# Patient Record
Sex: Female | Born: 1960 | Race: Black or African American | Hispanic: No | Marital: Married | Smoking: Former smoker
Health system: Southern US, Community
[De-identification: ages and names within clinical notes are randomized; demographics above are authoritative.]

## PROBLEM LIST (undated history)

## (undated) DIAGNOSIS — I10 Essential (primary) hypertension: Secondary | ICD-10-CM

## (undated) DIAGNOSIS — F32A Depression, unspecified: Secondary | ICD-10-CM

## (undated) DIAGNOSIS — M069 Rheumatoid arthritis, unspecified: Secondary | ICD-10-CM

## (undated) DIAGNOSIS — I4819 Other persistent atrial fibrillation: Secondary | ICD-10-CM

## (undated) DIAGNOSIS — J84112 Idiopathic pulmonary fibrosis: Secondary | ICD-10-CM

## (undated) DIAGNOSIS — E669 Obesity, unspecified: Secondary | ICD-10-CM

## (undated) DIAGNOSIS — I509 Heart failure, unspecified: Secondary | ICD-10-CM

## (undated) DIAGNOSIS — E119 Type 2 diabetes mellitus without complications: Secondary | ICD-10-CM

## (undated) DIAGNOSIS — G4733 Obstructive sleep apnea (adult) (pediatric): Secondary | ICD-10-CM

## (undated) DIAGNOSIS — J189 Pneumonia, unspecified organism: Secondary | ICD-10-CM

## (undated) DIAGNOSIS — F329 Major depressive disorder, single episode, unspecified: Secondary | ICD-10-CM

## (undated) DIAGNOSIS — F419 Anxiety disorder, unspecified: Secondary | ICD-10-CM

## (undated) HISTORY — DX: Other persistent atrial fibrillation: I48.19

## (undated) HISTORY — DX: Rheumatoid arthritis, unspecified: M06.9

## (undated) HISTORY — PX: OTHER SURGICAL HISTORY: SHX169

## (undated) HISTORY — DX: Idiopathic pulmonary fibrosis: J84.112

## (undated) HISTORY — DX: Obstructive sleep apnea (adult) (pediatric): G47.33

---

## 1998-12-07 ENCOUNTER — Emergency Department (HOSPITAL_COMMUNITY): Admission: EM | Admit: 1998-12-07 | Discharge: 1998-12-07 | Payer: Self-pay | Admitting: Emergency Medicine

## 1998-12-09 ENCOUNTER — Other Ambulatory Visit: Admission: RE | Admit: 1998-12-09 | Discharge: 1998-12-09 | Payer: Self-pay | Admitting: *Deleted

## 2000-07-26 ENCOUNTER — Emergency Department (HOSPITAL_COMMUNITY): Admission: EM | Admit: 2000-07-26 | Discharge: 2000-07-26 | Payer: Self-pay | Admitting: Emergency Medicine

## 2000-10-28 ENCOUNTER — Emergency Department (HOSPITAL_COMMUNITY): Admission: EM | Admit: 2000-10-28 | Discharge: 2000-10-28 | Payer: Self-pay | Admitting: Emergency Medicine

## 2001-06-05 ENCOUNTER — Emergency Department (HOSPITAL_COMMUNITY): Admission: EM | Admit: 2001-06-05 | Discharge: 2001-06-05 | Payer: Self-pay | Admitting: Emergency Medicine

## 2002-09-21 ENCOUNTER — Encounter: Payer: Self-pay | Admitting: Emergency Medicine

## 2002-09-21 ENCOUNTER — Emergency Department (HOSPITAL_COMMUNITY): Admission: EM | Admit: 2002-09-21 | Discharge: 2002-09-21 | Payer: Self-pay | Admitting: Emergency Medicine

## 2002-09-26 ENCOUNTER — Encounter: Admission: RE | Admit: 2002-09-26 | Discharge: 2002-09-26 | Payer: Self-pay | Admitting: Internal Medicine

## 2002-10-16 ENCOUNTER — Ambulatory Visit (HOSPITAL_COMMUNITY): Admission: RE | Admit: 2002-10-16 | Discharge: 2002-10-16 | Payer: Self-pay | Admitting: Internal Medicine

## 2002-10-16 ENCOUNTER — Encounter (INDEPENDENT_AMBULATORY_CARE_PROVIDER_SITE_OTHER): Payer: Self-pay | Admitting: Cardiology

## 2003-11-14 ENCOUNTER — Encounter: Admission: RE | Admit: 2003-11-14 | Discharge: 2003-11-14 | Payer: Self-pay | Admitting: Internal Medicine

## 2004-06-22 ENCOUNTER — Encounter: Admission: RE | Admit: 2004-06-22 | Discharge: 2004-06-22 | Payer: Self-pay | Admitting: Internal Medicine

## 2004-08-28 ENCOUNTER — Encounter: Admission: RE | Admit: 2004-08-28 | Discharge: 2004-08-28 | Payer: Self-pay | Admitting: General Surgery

## 2004-09-01 ENCOUNTER — Encounter (INDEPENDENT_AMBULATORY_CARE_PROVIDER_SITE_OTHER): Payer: Self-pay | Admitting: *Deleted

## 2004-09-01 ENCOUNTER — Ambulatory Visit (HOSPITAL_COMMUNITY): Admission: RE | Admit: 2004-09-01 | Discharge: 2004-09-01 | Payer: Self-pay | Admitting: General Surgery

## 2004-09-01 ENCOUNTER — Ambulatory Visit (HOSPITAL_BASED_OUTPATIENT_CLINIC_OR_DEPARTMENT_OTHER): Admission: RE | Admit: 2004-09-01 | Discharge: 2004-09-01 | Payer: Self-pay | Admitting: General Surgery

## 2007-07-09 ENCOUNTER — Emergency Department (HOSPITAL_COMMUNITY): Admission: EM | Admit: 2007-07-09 | Discharge: 2007-07-09 | Payer: Self-pay | Admitting: Emergency Medicine

## 2007-12-31 ENCOUNTER — Emergency Department (HOSPITAL_COMMUNITY): Admission: EM | Admit: 2007-12-31 | Discharge: 2007-12-31 | Payer: Self-pay | Admitting: Emergency Medicine

## 2008-01-23 ENCOUNTER — Observation Stay (HOSPITAL_COMMUNITY): Admission: EM | Admit: 2008-01-23 | Discharge: 2008-01-24 | Payer: Self-pay | Admitting: Family Medicine

## 2008-01-23 ENCOUNTER — Ambulatory Visit: Payer: Self-pay | Admitting: Cardiology

## 2008-01-24 ENCOUNTER — Encounter (INDEPENDENT_AMBULATORY_CARE_PROVIDER_SITE_OTHER): Payer: Self-pay | Admitting: *Deleted

## 2008-01-24 HISTORY — PX: DOPPLER ECHOCARDIOGRAPHY: SHX263

## 2010-04-01 ENCOUNTER — Encounter (INDEPENDENT_AMBULATORY_CARE_PROVIDER_SITE_OTHER): Payer: Self-pay | Admitting: Emergency Medicine

## 2010-04-01 ENCOUNTER — Ambulatory Visit: Payer: Self-pay | Admitting: Vascular Surgery

## 2010-04-01 ENCOUNTER — Emergency Department (HOSPITAL_COMMUNITY): Admission: EM | Admit: 2010-04-01 | Discharge: 2010-04-01 | Payer: Self-pay | Admitting: Emergency Medicine

## 2010-05-12 ENCOUNTER — Emergency Department (HOSPITAL_COMMUNITY): Admission: EM | Admit: 2010-05-12 | Discharge: 2010-05-12 | Payer: Self-pay | Admitting: Emergency Medicine

## 2010-07-23 ENCOUNTER — Encounter (INDEPENDENT_AMBULATORY_CARE_PROVIDER_SITE_OTHER): Payer: Self-pay | Admitting: Emergency Medicine

## 2010-07-23 ENCOUNTER — Ambulatory Visit: Payer: Self-pay | Admitting: Vascular Surgery

## 2010-07-23 ENCOUNTER — Emergency Department (HOSPITAL_COMMUNITY): Admission: EM | Admit: 2010-07-23 | Discharge: 2010-07-23 | Payer: Self-pay | Admitting: Emergency Medicine

## 2011-01-24 LAB — BASIC METABOLIC PANEL
CO2: 27 mEq/L (ref 19–32)
Calcium: 9.7 mg/dL (ref 8.4–10.5)
GFR calc Af Amer: >60 mL/min (ref >60)
GFR calc non Af Amer: >60 mL/min (ref >60)
Sodium: 145 mEq/L (ref 135–145)

## 2011-01-24 LAB — CBC
Hemoglobin: 13.1 g/dL (ref 12.0–15.0)
RBC: 4.55 MIL/uL (ref 3.87–5.11)
WBC: 8.1 10*3/uL (ref 4.0–10.5)

## 2011-01-24 LAB — DIFFERENTIAL
Lymphocytes Relative: 26 % (ref 12–46)
Lymphs Abs: 2.1 10*3/uL (ref 0.7–4.0)
Monocytes Relative: 5 % (ref 3–12)
Neutro Abs: 5.4 10*3/uL (ref 1.7–7.7)
Neutrophils Relative %: 67 % (ref 43–77)

## 2011-01-24 LAB — POCT CARDIAC MARKERS
CKMB, poc: <1 ng/mL — ABNORMAL LOW (ref 1.0–8.0)
Myoglobin, poc: 31.5 ng/mL (ref 12–200)

## 2011-01-25 LAB — DIFFERENTIAL
Basophils Absolute: 0 10*3/uL (ref 0.0–0.1)
Lymphocytes Relative: 26 % (ref 12–46)
Monocytes Absolute: 0.3 10*3/uL (ref 0.1–1.0)
Monocytes Relative: 5 % (ref 3–12)
Neutro Abs: 4.5 10*3/uL (ref 1.7–7.7)
Neutrophils Relative %: 67 % (ref 43–77)

## 2011-01-25 LAB — BASIC METABOLIC PANEL
Calcium: 8.6 mg/dL (ref 8.4–10.5)
Creatinine, Ser: 0.65 mg/dL (ref 0.4–1.2)
GFR calc Af Amer: >60 mL/min (ref >60)
GFR calc non Af Amer: >60 mL/min (ref >60)
Sodium: 133 mEq/L — ABNORMAL LOW (ref 135–145)

## 2011-01-25 LAB — CBC
Hemoglobin: 12.9 g/dL (ref 12.0–15.0)
RBC: 4.41 MIL/uL (ref 3.87–5.11)
RDW: 13.5 % (ref 11.5–15.5)

## 2011-02-06 ENCOUNTER — Emergency Department (HOSPITAL_COMMUNITY)
Admission: EM | Admit: 2011-02-06 | Discharge: 2011-02-06 | Disposition: A | Discharge status: Home / Self Care | Payer: No Typology Code available for payment source | Attending: Emergency Medicine | Admitting: Emergency Medicine

## 2011-02-06 DIAGNOSIS — IMO0002 Reserved for concepts with insufficient information to code with codable children: Secondary | ICD-10-CM | POA: Insufficient documentation

## 2011-02-06 DIAGNOSIS — I509 Heart failure, unspecified: Secondary | ICD-10-CM | POA: Insufficient documentation

## 2011-02-06 DIAGNOSIS — I4891 Unspecified atrial fibrillation: Secondary | ICD-10-CM | POA: Insufficient documentation

## 2011-02-06 DIAGNOSIS — M25519 Pain in unspecified shoulder: Secondary | ICD-10-CM | POA: Insufficient documentation

## 2011-02-06 DIAGNOSIS — I10 Essential (primary) hypertension: Secondary | ICD-10-CM | POA: Insufficient documentation

## 2011-02-06 DIAGNOSIS — S0003XA Contusion of scalp, initial encounter: Secondary | ICD-10-CM | POA: Insufficient documentation

## 2011-02-06 DIAGNOSIS — S0083XA Contusion of other part of head, initial encounter: Secondary | ICD-10-CM | POA: Insufficient documentation

## 2011-03-23 NOTE — H&P (Signed)
NAMEKATIE, Kathleen Kane               ACCOUNT NO.:  [id number]   MEDICAL RECORD NO.:  [id number]          PATIENT TYPE:  INP   LOCATION:  1846                         FACILITY:  [facility]   PHYSICIAN:  Michaelyn Barter, M.D. DATE OF BIRTH:  Dec 14, 1960   DATE OF ADMISSION:  01/23/2008  DATE OF DISCHARGE:                              HISTORY & PHYSICAL   PRIMARY CARE PHYSICIAN:  Talmadge Coventry, M.D.   CHIEF COMPLAINTS:  Shortness of breath.   HISTORY OF PRESENT ILLNESS:  [name] is a 50 year old female who  states that approximately 3 days ago she developed chest congestion, a  sore throat,  and right ear pain.  She initially attributed all of her  symptoms to the presence of a cold. Over the past 3 days, she began to  develop problems breathing, particularly when taking deep breaths.  Likewise, she has also indicated that she has recently noticed that when  she attempts to lie down, she feels more short of breath.  There has  been no nausea, vomiting, fevers or chills.  She does have an occasional  cough which is nonproductive.  When asked if there was chest pain, she  initially stated no, and then she stated that she has some vague  tightness under her breast area, and she pointed to her upper abdominal  region.  She states that it occurred earlier today but has since  disappeared   PAST MEDICAL HISTORY:  The patient indicates that she had an episode of  CHF back in 1992.   PAST SURGICAL HISTORY:  None.   HOME MEDICATIONS:  None.   ALLERGIES:  None.   SOCIAL HISTORY:  Cigarettes:  The patient started smoking at the age of  28.  She smokes three-fourth of a pack per day.  Alcohol:  The patient  stopped drinking in 2000.  Street drugs.  The patient denies.   FAMILY HISTORY:  Mother has hypertension.  Father:  Hypertension,  hyperlipidemia.Marland Kitchen   REVIEW OF SYSTEMS:  As per HPI, otherwise all other systems are  negative.   PHYSICAL EXAMINATION:  GENERAL:  The patient is  awake.  She is  cooperative.  She coughs numerous times.  VITAL SIGNS:  Temperature is 98.5, blood pressure 171/94, heart rate 87,  respirations 18, O2 saturation is 95%.  HEENT:  Normocephalic, atraumatic.  Anicteric. Extraocular movements are  intact.  Oral mucosa is pink.  No thrush or exudates.  The patient's  right ear tympanic membrane is difficult to visualize secondary to a  large amount of cerumen present.  NECK:  No JVD.  The patient has a thick neck.  No lymphadenopathy, no  thyromegaly.  CARDIAC:  S1-S2 present.  RESPIRATORY:  No crackles or wheezes.  ABDOMEN:  Flat, soft, nontender, nondistended.  Positive bowel sounds.  No masses palpated.  EXTREMITIES:  No leg edema.  NEUROLOGIC:  The patient is alert and oriented x3.  Cranial nerves II-  XII are intact.  MUSCULOSKELETAL:  5/5 upper and lower extremity strength.   LABORATORY DATA:  Creatinine 0.6, CK-MB at point of care less than 1.0.  Troponin  I at point of care less than 0.05.  BNP 81. White blood cell  count 7.9, hemoglobin 13.5, hematocrit 40.1, platelet count 298.   Chest x-ray reveals peribronchial thickening.  No active disease.   ASSESSMENT/PLAN:  1. Episode of atrial fibrillation with rapid ventricular response.  On      review of the patient's repeat EKG, this appears to have resolved.      This may have been an episode of paroxysmal atrial fibrillation.      The patient states that she has never had this before. In light of      the patient's rhythm converting spontaneously, will monitor for      now.  The trigger for this is questionable. Will check cardiac      markers as well as 2-D echocardiogram. Will also consider starting      the negative inotropic agent, particularly in light of the patient      still having sinus tachycardia.  Will start an aspirin. Will check      a TSH, T4, as well as D-dimer and fasting lipid profile. If there      is a repeat occurrence of atrial fibrillation, they may  consider      starting anticoagulation on the patient.  2. Probable bronchitis.  Will start the patient on empiric      antibiotics.  3. Elevated blood pressure. Will start the patient on antihypertensive      medications.  4. Gastrointestinal prophylaxis.  Will provide Protonix.  5. Deep vein thrombosis prophylaxis.  Will provide Lovenox.      Michaelyn Barter, M.D.  Electronically Signed     OR/MEDQ  D:  01/23/2008  T:  01/23/2008  Job:  [id number]   cc:   Talmadge Coventry, M.D.

## 2011-03-26 NOTE — Op Note (Signed)
[name], [name]               ACCOUNT NO.:  [id number]   MEDICAL RECORD NO.:  [id number]          PATIENT TYPE:  AMB   LOCATION:  DSC                          FACILITY:  [facility]   PHYSICIAN:  Rose Phi. Young, M.D.   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   OPERATION:  Excision of same.   SURGEON:  Rose Phi. Maple Hudson, M.D.   ANESTHESIA:  General.   OPERATIVE PROCEDURE:  This 50 year old female had almost a hidradenitis in  the inferior portion of her left breast in a narrow area.  It has been  actively draining, but infection was under control.   After suitable general anesthesia was induced, the patient was placed in the  supine position with the arms extended on the arm board.  The left breast  was prepped and draped in usual fashion.  A curvilinear incision in the  inferior portion of the left breast, taking out a wide ellipse of skin where  the infected tissue was, was then outlined with a marking pencil, then was  excised.  We went completely around all this infected tissue.  We irrigated  the wound.  I then closed it loosely with 3-0 nylon sutures.  Dressing  applied.  Patient was transferred to recovery room in satisfactory  condition, having tolerated the procedure well.      [name]   PRY/MEDQ  D:  09/01/2004  T:  09/01/2004  Job:  [id number]

## 2011-07-28 ENCOUNTER — Emergency Department (HOSPITAL_COMMUNITY)
Admission: EM | Admit: 2011-07-28 | Discharge: 2011-07-28 | Discharge status: Against Medical Advice | Payer: Self-pay | Attending: Emergency Medicine | Admitting: Emergency Medicine

## 2011-07-28 DIAGNOSIS — R51 Headache: Secondary | ICD-10-CM | POA: Insufficient documentation

## 2011-07-28 DIAGNOSIS — H538 Other visual disturbances: Secondary | ICD-10-CM | POA: Insufficient documentation

## 2011-07-28 DIAGNOSIS — H5710 Ocular pain, unspecified eye: Secondary | ICD-10-CM | POA: Insufficient documentation

## 2011-08-02 LAB — DIFFERENTIAL
Basophils Relative: 0
Lymphocytes Relative: 23
Lymphs Abs: 1.8
Monocytes Absolute: 0.4
Monocytes Relative: 5
Neutro Abs: 5.5
Neutrophils Relative %: 70

## 2011-08-02 LAB — CARDIAC PANEL(CRET KIN+CKTOT+MB+TROPI)
CK, MB: 1
Relative Index: INVALID
Total CK: 110
Total CK: 94
Troponin I: 0.02

## 2011-08-02 LAB — D-DIMER, QUANTITATIVE: D-Dimer, Quant: 0.37

## 2011-08-02 LAB — T4, FREE: Free T4: 0.94

## 2011-08-02 LAB — CBC
Hemoglobin: 11.9 — ABNORMAL LOW
Hemoglobin: 13.5
MCHC: 33.6
MCHC: 33.7
MCV: 84.8
RBC: 4.19
RBC: 4.76
RDW: 14.3
WBC: 7.9

## 2011-08-02 LAB — I-STAT 8, (EC8 V) (CONVERTED LAB)
Acid-base deficit: 1
Bicarbonate: 24.2 — ABNORMAL HIGH
HCT: 45
Operator id: 265201
TCO2: 25
pCO2, Ven: 40.7 — ABNORMAL LOW
pH, Ven: 7.382 — ABNORMAL HIGH

## 2011-08-02 LAB — BASIC METABOLIC PANEL
CO2: 25
Calcium: 8.9
Creatinine, Ser: 0.5
GFR calc Af Amer: >60
GFR calc non Af Amer: >60
Sodium: 139

## 2011-08-02 LAB — POCT I-STAT CREATININE
Creatinine, Ser: 0.6
Operator id: 265201

## 2011-08-02 LAB — POCT CARDIAC MARKERS
CKMB, poc: <1 — ABNORMAL LOW
Myoglobin, poc: 25.9
Troponin i, poc: <0.05

## 2011-08-02 LAB — LIPID PANEL: Cholesterol: 155

## 2011-08-02 LAB — TROPONIN I: Troponin I: 0.02

## 2011-08-02 LAB — B-NATRIURETIC PEPTIDE (CONVERTED LAB): Pro B Natriuretic peptide (BNP): 77

## 2011-11-09 DIAGNOSIS — I509 Heart failure, unspecified: Secondary | ICD-10-CM

## 2011-11-09 HISTORY — DX: Heart failure, unspecified: I50.9

## 2011-12-25 ENCOUNTER — Emergency Department (INDEPENDENT_AMBULATORY_CARE_PROVIDER_SITE_OTHER)
Admission: EM | Admit: 2011-12-25 | Discharge: 2011-12-25 | Disposition: A | Discharge status: Home / Self Care | Payer: Self-pay | Source: Home / Self Care | Attending: Family Medicine | Admitting: Family Medicine

## 2011-12-25 ENCOUNTER — Encounter (HOSPITAL_COMMUNITY): Payer: Self-pay

## 2011-12-25 DIAGNOSIS — S0502XA Injury of conjunctiva and corneal abrasion without foreign body, left eye, initial encounter: Secondary | ICD-10-CM

## 2011-12-25 DIAGNOSIS — S058X9A Other injuries of unspecified eye and orbit, initial encounter: Secondary | ICD-10-CM

## 2011-12-25 DIAGNOSIS — H109 Unspecified conjunctivitis: Secondary | ICD-10-CM

## 2011-12-25 MED ORDER — HYDROCODONE-ACETAMINOPHEN 5-325 MG PO TABS: ORAL_TABLET | ORAL | Status: AC

## 2011-12-25 MED ORDER — POLYMYXIN B-TRIMETHOPRIM 10000-0.1 UNIT/ML-% OP SOLN: 1.0000 [drp] | OPHTHALMIC | Status: AC

## 2011-12-25 NOTE — Discharge Instructions (Signed)
Use the eye drops as directed. Use normal saline to flush the eye just prior to using the antibiotic drops. Return immediately to the Emergency Department should your symptoms worsen in any way, over the next 24 to 48 hours, such as severe or worsening pain, ANY change in vision such as loss of vision, worsening of the blurred vision, loss of ANY field of vision such as peripheral. If no improvement by end of next week, please call the ophthalmologist listed on your discharge papers.

## 2011-12-25 NOTE — ED Provider Notes (Signed)
History     CSN: [id number]  Arrival date & time 12/25/11  1655   First MD Initiated Contact with Patient 12/25/11 1759      Chief Complaint  Patient presents with  . Eye Drainage    (Consider location/radiation/quality/duration/timing/severity/associated sxs/prior treatment) HPI Comments: Kathleen Kane presents for evaluation of LEFT eye discomfort, drainage, tearing, and blurred vision that started 3 days ago. She is unsure about injury to her eye but she thinks that she may have had an eyelash and was rubbing the eye at onset of symptoms.   Patient is a 51 y.o. female presenting with eye problem. The history is provided by the patient.  Eye Problem  This is a new problem. The current episode started more than 2 days ago. The problem occurs constantly. The problem has not changed since onset.There is pain in the left eye. There was no injury mechanism. There is no history of trauma to the eye. She does not wear contacts. Associated symptoms include blurred vision, discharge, foreign body sensation and eye redness.    Past Medical History  Diagnosis Date  . Atrial fibrillation     Past Surgical History  Procedure Date  . Cesarean section     History reviewed. No pertinent family history.  History  Substance Use Topics  . Smoking status: Current Everyday Smoker -- 1.0 packs/day  . Smokeless tobacco: Not on file  . Alcohol Use: No    OB History    Grav Para Term Preterm Abortions TAB SAB Ect Mult Living                  Review of Systems  Constitutional: Negative.   HENT: Negative.   Eyes: Positive for blurred vision, discharge and redness.  Respiratory: Negative.   Cardiovascular: Negative.   Gastrointestinal: Negative.   Genitourinary: Negative.   Musculoskeletal: Negative.   Skin: Negative.   Neurological: Negative.     Allergies  Review of patient's allergies indicates no known allergies.  Home Medications   Current Outpatient Rx  Name Route Sig Dispense  Refill  . FUROSEMIDE 20 MG PO TABS Oral Take 20 mg by mouth 2 (two) times daily.    . IBUPROFEN 600 MG PO TABS Oral Take 600 mg by mouth every 6 (six) hours as needed.    Marland Kitchen HYDROCODONE-ACETAMINOPHEN 5-325 MG PO TABS  Take one to two tablets every 4 to 6 hours as needed for pain 10 tablet 0  . POLYMYXIN B-TRIMETHOPRIM 10000-0.1 UNIT/ML-% OP SOLN Both Eyes Place 1 drop into both eyes every 4 (four) hours. 10 mL 0    BP 179/99  Pulse 94  Temp(Src) 98.4 F (36.9 C) (Oral)  Resp 18  SpO2 96%  Physical Exam  Nursing note and vitals reviewed. Constitutional: She is oriented to person, place, and time. She appears well-developed and well-nourished.  HENT:  Head: Normocephalic and atraumatic.  Eyes: EOM are normal. Pupils are equal, round, and reactive to light. Left eye exhibits discharge. Left conjunctiva is injected.  Fundoscopic exam:      The left eye shows no AV nicking, no exudate and no papilledema.  Slit lamp exam:      The right eye shows no anterior chamber bulge.       The left eye shows corneal abrasion and fluorescein uptake. The left eye shows no foreign body and no anterior chamber bulge.  Neck: Normal range of motion.  Pulmonary/Chest: Effort normal.  Musculoskeletal: Normal range of motion.  Neurological: She is alert  and oriented to person, place, and time.  Skin: Skin is warm and dry.  Psychiatric: Her behavior is normal.    ED Course  Procedures (including critical care time)  Labs Reviewed - No data to display No results found.   1. Corneal abrasion, left   2. Conjunctivitis       MDM  rx given for polytrim and hydrocodone PRN for pain; cannot afford Acular; follow up and return precautions given        Richardo Priest, MD 12/25/11 1910

## 2011-12-25 NOTE — ED Notes (Signed)
Pt has eye drainage that started three days ago.

## 2012-02-17 ENCOUNTER — Encounter (HOSPITAL_COMMUNITY): Payer: Self-pay | Admitting: *Deleted

## 2012-02-17 ENCOUNTER — Emergency Department (HOSPITAL_COMMUNITY): Payer: Self-pay

## 2012-02-17 ENCOUNTER — Emergency Department (INDEPENDENT_AMBULATORY_CARE_PROVIDER_SITE_OTHER)
Admission: EM | Admit: 2012-02-17 | Discharge: 2012-02-17 | Disposition: A | Discharge status: Nursing Home (Includes ICF) | Payer: Self-pay | Source: Home / Self Care | Attending: Emergency Medicine | Admitting: Emergency Medicine

## 2012-02-17 ENCOUNTER — Emergency Department (HOSPITAL_COMMUNITY)
Admission: EM | Admit: 2012-02-17 | Discharge: 2012-02-18 | Disposition: A | Discharge status: Home / Self Care | Payer: Self-pay | Attending: Emergency Medicine | Admitting: Emergency Medicine

## 2012-02-17 DIAGNOSIS — R079 Chest pain, unspecified: Secondary | ICD-10-CM

## 2012-02-17 DIAGNOSIS — R05 Cough: Secondary | ICD-10-CM | POA: Insufficient documentation

## 2012-02-17 DIAGNOSIS — R0989 Other specified symptoms and signs involving the circulatory and respiratory systems: Secondary | ICD-10-CM

## 2012-02-17 DIAGNOSIS — R0602 Shortness of breath: Secondary | ICD-10-CM | POA: Insufficient documentation

## 2012-02-17 DIAGNOSIS — I4891 Unspecified atrial fibrillation: Secondary | ICD-10-CM

## 2012-02-17 DIAGNOSIS — R030 Elevated blood-pressure reading, without diagnosis of hypertension: Secondary | ICD-10-CM | POA: Insufficient documentation

## 2012-02-17 DIAGNOSIS — R609 Edema, unspecified: Secondary | ICD-10-CM | POA: Insufficient documentation

## 2012-02-17 DIAGNOSIS — R059 Cough, unspecified: Secondary | ICD-10-CM | POA: Insufficient documentation

## 2012-02-17 DIAGNOSIS — I509 Heart failure, unspecified: Secondary | ICD-10-CM | POA: Insufficient documentation

## 2012-02-17 DIAGNOSIS — R0600 Dyspnea, unspecified: Secondary | ICD-10-CM

## 2012-02-17 DIAGNOSIS — J811 Chronic pulmonary edema: Secondary | ICD-10-CM | POA: Insufficient documentation

## 2012-02-17 DIAGNOSIS — I10 Essential (primary) hypertension: Secondary | ICD-10-CM

## 2012-02-17 DIAGNOSIS — M7989 Other specified soft tissue disorders: Secondary | ICD-10-CM | POA: Insufficient documentation

## 2012-02-17 HISTORY — DX: Heart failure, unspecified: I50.9

## 2012-02-17 LAB — BASIC METABOLIC PANEL
CO2: 24 mEq/L (ref 19–32)
Calcium: 9.7 mg/dL (ref 8.4–10.5)
GFR calc non Af Amer: >90 mL/min (ref >90)
Glucose, Bld: 144 mg/dL — ABNORMAL HIGH (ref 70–99)
Potassium: 3.6 mEq/L (ref 3.5–5.1)
Sodium: 138 mEq/L (ref 135–145)

## 2012-02-17 LAB — POCT I-STAT TROPONIN I: Troponin i, poc: 0.01 ng/mL (ref 0.00–0.08)

## 2012-02-17 LAB — CBC
Platelets: 254 10*3/uL (ref 150–400)
RBC: 4.36 MIL/uL (ref 3.87–5.11)
RDW: 13.5 % (ref 11.5–15.5)
WBC: 7.5 10*3/uL (ref 4.0–10.5)

## 2012-02-17 LAB — DIFFERENTIAL
Basophils Absolute: 0 10*3/uL (ref 0.0–0.1)
Eosinophils Absolute: 0.1 10*3/uL (ref 0.0–0.7)
Eosinophils Relative: 2 % (ref 0–5)
Lymphocytes Relative: 35 % (ref 12–46)
Lymphs Abs: 2.6 10*3/uL (ref 0.7–4.0)
Neutrophils Relative %: 57 % (ref 43–77)

## 2012-02-17 MED ORDER — ALBUTEROL SULFATE (5 MG/ML) 0.5% IN NEBU
5.0000 mg | INHALATION_SOLUTION | Freq: Once | RESPIRATORY_TRACT | Status: AC
  Administered 2012-02-17: 5 mg via RESPIRATORY_TRACT
  Filled 2012-02-17 (×2): qty 0.5

## 2012-02-17 MED ORDER — FUROSEMIDE 20 MG PO TABS: 20.0000 mg | ORAL_TABLET | Freq: Two times a day (BID) | ORAL | Status: DC

## 2012-02-17 MED ORDER — ASPIRIN 81 MG PO CHEW
CHEWABLE_TABLET | ORAL | Status: AC
  Filled 2012-02-17: qty 1

## 2012-02-17 MED ORDER — AMLODIPINE BESYLATE 10 MG PO TABS: 10.0000 mg | ORAL_TABLET | Freq: Every day | ORAL | Status: DC

## 2012-02-17 MED ORDER — ASPIRIN 81 MG PO CHEW
324.0000 mg | CHEWABLE_TABLET | Freq: Once | ORAL | Status: AC
  Administered 2012-02-17: 324 mg via ORAL

## 2012-02-17 MED ORDER — ASPIRIN 81 MG PO CHEW
CHEWABLE_TABLET | ORAL | Status: AC
  Filled 2012-02-17: qty 3

## 2012-02-17 NOTE — ED Notes (Signed)
C/o low sternal chest pain, short of breath, ankles swollen. States she has had CHF before and states this feels the same way.

## 2012-02-17 NOTE — Discharge Instructions (Signed)
Hypertension Information As your heart beats, it forces blood through your arteries. This force is your blood pressure. If the pressure is too high, it is called hypertension (HTN) or high blood pressure. HTN is dangerous because you may have it and not know it. High blood pressure may mean that your heart has to work harder to pump blood. Your arteries may be narrow or stiff. The extra work puts you at risk for heart disease, stroke, and other problems.  Blood pressure consists of two numbers, a higher number over a lower, 110/72, for example. It is stated as "110 over 72." The ideal is below 120 for the top number (systolic) and under 80 for the bottom (diastolic).  You should pay close attention to your blood pressure if you have certain conditions such as:  Heart failure.   Prior heart attack.   Diabetes   Chronic kidney disease.   Prior stroke.   Multiple risk factors for heart disease.  To see if you have HTN, your blood pressure should be measured while you are seated with your arm held at the level of the heart. It should be measured at least twice. A one-time elevated blood pressure reading (especially in the Emergency Department) does not mean that you need treatment. There may be conditions in which the blood pressure is different between your right and left arms. It is important to see your caregiver soon for a recheck. Most people have essential hypertension which means that there is not a specific cause. This type of high blood pressure may be lowered by changing lifestyle factors such as:  Stress.   Smoking.   Lack of exercise.   Excessive weight.   Drug/tobacco/alcohol use.   Eating less salt.  Most people do not have symptoms from high blood pressure until it has caused damage to the body. Effective treatment can often prevent, delay or reduce that damage. TREATMENT  Treatment for high blood pressure, when a cause has been identified, is directed at the cause. There  are a large number of medications to treat HTN. These fall into several categories, and your caregiver will help you select the medicines that are best for you. Medications may have side effects. You should review side effects with your caregiver. If your blood pressure stays high after you have made lifestyle changes or started on medicines,   Your medication(s) may need to be changed.   Other problems may need to be addressed.   Be certain you understand your prescriptions, and know how and when to take your medicine.   Be sure to follow up with your caregiver within the time frame advised (usually within two weeks) to have your blood pressure rechecked and to review your medications.   If you are taking more than one medicine to lower your blood pressure, make sure you know how and at what times they should be taken. Taking two medicines at the same time can result in blood pressure that is too low.  Document Released: 12/28/2005 Document Revised: 07/07/2011 Document Reviewed: 01/04/2008 [facility] Patient Information 2012 [place], [place].Pulmonary Edema Pulmonary edema (PE) is a condition in which fluid collects in the lungs. This makes it hard to breathe. PE may be a result of the heart not pumping very well, or a result of injury.  CAUSES   Coronary artery disease (blockages in the arteries of the heart) deprives the heart muscle of oxygen, and weakens the muscle. A heart attack is a form of coronary artery disease.  High blood pressure causes the heart muscle to work harder than usual. Over time, the heart muscle may get stiff, and it starts to work less efficiently. It may also fatigue and weaken.   Viral infection of the heart (myocarditis) may weaken the heart muscle.   Metabolic conditions such as thyroid disease, excessive alcohol use, certain vitamin deficiencies, or diabetes, may also weaken the heart muscle.   Leaky or stiff heart valves may impair normal heart function.    Lung disease may strain the heart muscle.   Excessive demands on the heart such as too much salt or fluid intake. Failure to take prescribed medicines.   Lung injury from toxins such as heat or poisonous gas.   Infection in the lungs or other parts of the body.   Fluid overload caused by kidney failure or medications.  SYMPTOMS   Shortness of breath at rest or with exertion.   Grunting, wheezing or gurgling while breathing.   Feeling like you are cannot get enough air.   Breaths are shallow and fast.   A lot of coughing with frothy or bloody mucus.   Skin may become cool, damp and turn a pale or bluish color.  DIAGNOSIS  Initial diagnosis may be based on your history, symptoms, and a physical examination. Additional tests for PE may include:  EKG.   Chest X-ray.   Blood tests.   Stress test.   Ultrasound evaluation of the heart (echocardiogram).   Evaluation by a heart doctor (cardiologist).   Test of the heart arteries to look for blockages (angiogram).   Check of blood oxygen.  TREATMENT  Treatment of PE will depend on the underlying cause and will focus first on relieving the symptoms.  Extra oxygen to make breathing easier and assist with removing mucus. This may include breathing treatments or a tube into the lungs and a breathing machine.   Medicine to help the body get rid of extra water, usually through an IV.   IV drugs or pills to help the heart pump better.   If poor heart function is the cause, it may include:   Procedures to open blocked arteries, repair damaged heart valves, or remove some of the damaged heart muscle.   A pacemaker to help the heart pump with less effort.  HOME CARE INSTRUCTIONS   Activity Level -- Your caregiver will help you determine what type of exercise program may be helpful. It is important to maintain strength and increase it if possible. Pace your activities to avoid shortness of breath or chest pain. Rest for at  least 1 hour before and after meals. Cardiac rehabilitation programs are available in some locations.   Diet -- Eat a heart healthy diet, low in salt, saturated fat, and cholesterol. Ask for help with choices.   Weight Monitoring --Record your hospital or clinic weight. When you get home, compare it to your scale and record your weight. Then, weigh yourself first thing in the morning daily, and record the weights. Tell your caregiver right away if you have gained 3 lb/1.4 kg in 1 day, 5 lb/2.3 kg in a week, or whatever amount you were told to report. Your medications may need to be adjusted.   Blood pressure monitoring should be done as often as directed. You can get a home blood pressure cuff at your drugstore. Record these values and bring them with you for your clinic visits. Notify your caregiver if you become dizzy or lightheaded upon standing up.  If you are currently a smoker, it is time to quit. Nicotine makes your heart work harder and is one of the leading causes of heart (cardiac) deaths. Do not use nicotine gum or patches before talking to your doctor.   Make an appointment with your caregiver as directed for follow-up.  SEEK IMMEDIATE MEDICAL CARE IF:   You have severe chest pain. Especially if the pain is crushing or pressure-like and spreads to the arms, back, neck, or jaw, or if you have sweating, are feeling sick to your stomach (nausea), or you are experiencing shortness of breath. THIS IS AN EMERGENCY. DO NOT wait to see if the pain will go away. Get medical help at once. Call for local emergency medical help. DO NOT drive yourself to the hospital.   Your weight increases by 3 lb/1.4 kg in 1 day or 5 lb/2.3 kg in a week.   You notice increasing shortness of breath that is unusual for you. This may happen during rest, sleep or with activity.   You develop chest pain (angina) or pain that is unusual for you.   You develop sweating or nausea that is unusual for you.   You  notice more swelling in your hands, feet, ankles or abdomen.   You notice lasting (persistent) dizziness, blurred vision, headache, or unsteadiness.   You begin to cough up bloody mucus (sputum).   You are unable to sleep because it is hard to breathe.   You begin to feel a "jumping" or "fluttering" sensation (palpitations) in the chest that is unusual for you.  IMPORTANT:  Make a list of every medicine, vitamin, or herbal supplement you are taking. Keep the list with you at all times. Show it to your caregiver at every visit and before starting a new medicine. Keep the list up to date.   Ask your caregiver or pharmacist to help you write a plan or schedule so that you know things about each medicine such as:   Why you are taking it.   The possible side effects.   The best time of day to take it.   Foods to take with it or avoid.   When to stop taking it.   Ask your caregiver for a copy of your latest heart tracing (EKG or ECG). Keep a copy with you at all times.  Document Released: 01/15/2003 Document Revised: 10/14/2011 Document Reviewed: 12/22/2007 [facility] Patient Information 2012 [place], [place].

## 2012-02-17 NOTE — ED Provider Notes (Signed)
Medical screening examination/treatment/procedure(s) were performed by non-physician practitioner and as supervising physician I was immediately available for consultation/collaboration.   Gwyneth Sprout, MD 02/17/12 [phone]

## 2012-02-17 NOTE — ED Notes (Signed)
o2 via nasal cannula @ 2 L/min

## 2012-02-17 NOTE — ED Provider Notes (Signed)
History     CSN: [id number]  Arrival date & time 02/17/12  2010   First MD Initiated Contact with Patient 02/17/12 2032      9:17 PM HPI Patient states "I am here for my atrial fibrillation and CHF". Patient reports for several days has begun to feel short of breath with exertion and laying flat. Also reports significant swelling and lower extremities. Associated with a mild cough which is worse with laying flat reports a significant history of medication noncompliance for at least one or 2 years due to lack of insurance. Patient is a 51 y.o. female presenting with chest pain. The history is provided by the patient.  Chest Pain The chest pain began 3 - 5 days ago. Chest pain occurs constantly. The chest pain is unchanged. The pain is associated with breathing and exertion. At its most intense, the pain is at 7/10. The quality of the pain is described as aching. The pain does not radiate. Primary symptoms include shortness of breath and cough. Pertinent negatives for primary symptoms include no fever, no fatigue, no wheezing, no abdominal pain, no nausea, no vomiting and no dizziness.  Pertinent negatives for associated symptoms include no diaphoresis, no numbness and no weakness. She tried nothing for the symptoms. Risk factors include smoking/tobacco exposure and obesity.  Her past medical history is significant for arrhythmia, CHF and hypertension.  Pertinent negatives for past medical history include no cancer, no hyperlipidemia, no MI and no PE.     Past Medical History  Diagnosis Date  . Atrial fibrillation   . CHF (congestive heart failure)   . Atrial fibrillation     Past Surgical History  Procedure Date  . Cesarean section   . Left breast cyst     No family history on file.  History  Substance Use Topics  . Smoking status: Current Everyday Smoker -- 1.0 packs/day  . Smokeless tobacco: Not on file  . Alcohol Use: No    OB History    Grav Para Term Preterm Abortions  TAB SAB Ect Mult Living                  Review of Systems  Constitutional: Negative for fever, diaphoresis and fatigue.  HENT: Negative for neck pain.   Respiratory: Positive for cough and shortness of breath. Negative for wheezing.   Cardiovascular: Positive for chest pain and leg swelling.  Gastrointestinal: Negative for nausea, vomiting and abdominal pain.  Neurological: Negative for dizziness, weakness, numbness and headaches.  All other systems reviewed and are negative.    Allergies  Review of patient's allergies indicates no known allergies.  Home Medications   Current Outpatient Rx  Name Route Sig Dispense Refill  . IBUPROFEN 600 MG PO TABS Oral Take 600 mg by mouth every 6 (six) hours as needed. For pain.      BP 153/95  Pulse 77  Temp(Src) 98.4 F (36.9 C) (Oral)  Resp 20  SpO2 100%  Physical Exam  Vitals reviewed. Constitutional: She is oriented to person, place, and time. Vital signs are normal. She appears well-developed and well-nourished.  HENT:  Head: Normocephalic and atraumatic.  Eyes: Conjunctivae are normal. Pupils are equal, round, and reactive to light.  Neck: Normal range of motion. Neck supple.  Cardiovascular: Normal rate, regular rhythm and normal heart sounds.  Exam reveals no gallop and no friction rub.   No murmur heard. Pulmonary/Chest: Effort normal and breath sounds normal. She has no wheezes. She has no rhonchi.  She has no rales. She exhibits no tenderness.  Abdominal:       Morbidly obese  Musculoskeletal: Normal range of motion.       Distal pulses normal. +1 Pitting edema.   Neurological: She is alert and oriented to person, place, and time. Coordination normal.  Skin: Skin is warm and dry. No rash noted. No erythema. No pallor.    ED Course  Procedures  Results for orders placed during the hospital encounter of 02/17/12  CBC      Component Value Range   WBC 7.5  4.0 - 10.5 (K/uL)   RBC 4.36  3.87 - 5.11 (MIL/uL)    Hemoglobin 12.4  12.0 - 15.0 (g/dL)   HCT 16.1  09.6 - 04.5 (%)   MCV 84.4  78.0 - 100.0 (fL)   MCH 28.4  26.0 - 34.0 (pg)   MCHC 33.7  30.0 - 36.0 (g/dL)   RDW 40.9  81.1 - 91.4 (%)   Platelets 254  150 - 400 (K/uL)  DIFFERENTIAL      Component Value Range   Neutrophils Relative 57  43 - 77 (%)   Neutro Abs 4.3  1.7 - 7.7 (K/uL)   Lymphocytes Relative 35  12 - 46 (%)   Lymphs Abs 2.6  0.7 - 4.0 (K/uL)   Monocytes Relative 6  3 - 12 (%)   Monocytes Absolute 0.4  0.1 - 1.0 (K/uL)   Eosinophils Relative 2  0 - 5 (%)   Eosinophils Absolute 0.1  0.0 - 0.7 (K/uL)   Basophils Relative 0  0 - 1 (%)   Basophils Absolute 0.0  0.0 - 0.1 (K/uL)  BASIC METABOLIC PANEL      Component Value Range   Sodium 138  135 - 145 (mEq/L)   Potassium 3.6  3.5 - 5.1 (mEq/L)   Chloride 103  96 - 112 (mEq/L)   CO2 24  19 - 32 (mEq/L)   Glucose, Bld 144 (*) 70 - 99 (mg/dL)   BUN 10  6 - 23 (mg/dL)   Creatinine, Ser 7.82  0.50 - 1.10 (mg/dL)   Calcium 9.7  8.4 - 95.6 (mg/dL)   GFR calc non Af Amer >90  >90 (mL/min)   GFR calc Af Amer >90  >90 (mL/min)  POCT I-STAT TROPONIN I      Component Value Range   Troponin i, poc 0.01  0.00 - 0.08 (ng/mL)   Comment 3            Dg Chest 2 View  02/17/2012  *RADIOLOGY REPORT*  Clinical Data: Shortness of breath.  Smoker.  CHEST - 2 VIEW  Comparison: 07/13/2010.  Findings: Pulmonary edema.  Cardiomegaly.  Tortuous aorta.  IMPRESSION: Pulmonary edema.  Cardiomegaly.  Tortuous aorta  Original Report Authenticated By: Fuller Canada, M.D.     MDM    Pt is a morbidly obese female. When I told patient that I did not hear fluid on her lungs, she breathed forcefully and I was able to audibly hear a wheezes from her upper respiratory tract. She does admit to smoking and I advised she quit.  Patient is hypertensive, and tachycardic with peripheral edema. Lab vital signs rechecked. I feel patient's complaints were likely gradual from years of noncompliance with HTN meds  patient reports one or 2 years ago she lost her insurance and has not taken any medications for her blood pressure or her atrial fibrillation since then  11:45 PM Patient states she is going  to [facility] tomorrow. Will start on short term lasix and norvasc. Patient States also her medicaid should come in 3 weeks. Advised immediate return for worsening symptoms. Vital signs have improved. Pt agrees with plan and is ready for d/c      Thomasene Lot, PA-C 02/17/12 2348

## 2012-02-17 NOTE — ED Notes (Signed)
Spoke to [facility], give report to [facility]

## 2012-02-17 NOTE — ED Provider Notes (Signed)
History     CSN: [id number]  Arrival date & time 02/17/12  1732   First MD Initiated Contact with Patient 02/17/12 1747      Chief Complaint  Patient presents with  . Chest Pain    (Consider location/radiation/quality/duration/timing/severity/associated sxs/prior treatment) HPI Comments: Patient presents to urgent care tonight complaining of feeling increasingly short of breath and experiencing of low sternal and substernal chest pains. Have noticed her ankles to have become more swollen than usual. Patient was sent by a friend that is a nurse and have taken her blood pressure which was very elevated as patient describes.  Patient admits that she in the past has been prescribed medicines for her pager fibrillation and cardiac are really but she has not follow up with neither for doctors nor her cardiologist.  Patient is a 51 y.o. female presenting with chest pain. The history is provided by the patient and the spouse.  Chest Pain The chest pain began 3 - 5 days ago. Chest pain occurs constantly. The chest pain is unchanged. The pain is associated with breathing and exertion. At its most intense, the pain is at 7/10. The quality of the pain is described as aching. The pain radiates to the epigastrium and precordial region. Chest pain is worsened by certain positions. Primary symptoms include shortness of breath and palpitations. Pertinent negatives for primary symptoms include no fever, no fatigue, no wheezing, no nausea and no vomiting.  The palpitations also occurred with shortness of breath.  Associated symptoms include lower extremity edema.  Pertinent negatives for associated symptoms include no diaphoresis and no numbness. She tried nothing for the symptoms.     Past Medical History  Diagnosis Date  . Atrial fibrillation   . CHF (congestive heart failure)   . Atrial fibrillation     Past Surgical History  Procedure Date  . Cesarean section   . Left breast cyst      History reviewed. No pertinent family history.  History  Substance Use Topics  . Smoking status: Current Everyday Smoker -- 1.0 packs/day  . Smokeless tobacco: Not on file  . Alcohol Use: No    OB History    Grav Para Term Preterm Abortions TAB SAB Ect Mult Living                  Review of Systems  Constitutional: Negative for fever, diaphoresis, appetite change and fatigue.  Respiratory: Positive for chest tightness and shortness of breath. Negative for wheezing.   Cardiovascular: Positive for chest pain, palpitations and leg swelling.  Gastrointestinal: Negative for nausea and vomiting.  Neurological: Negative for numbness.    Allergies  Review of patient's allergies indicates no known allergies.  Home Medications   Current Outpatient Rx  Name Route Sig Dispense Refill  . FUROSEMIDE 20 MG PO TABS Oral Take 20 mg by mouth 2 (two) times daily.    . IBUPROFEN 600 MG PO TABS Oral Take 600 mg by mouth every 6 (six) hours as needed.      BP 176/105  Pulse 90  Temp(Src) 98.7 F (37.1 C) (Oral)  Resp 24  SpO2 100%  Physical Exam  Nursing note and vitals reviewed. Constitutional: No distress.  Neck: Neck supple. No JVD present.  Cardiovascular: An irregular rhythm present. Exam reveals distant heart sounds. Exam reveals no gallop.        Lower extremity edema noted 1+ bilaterally patient piece Limited exam  Pulmonary/Chest: No accessory muscle usage. Not tachypneic. No respiratory distress.  She has decreased breath sounds. She has no wheezes. She has no rhonchi. She has no rales.  Abdominal: Soft.  Skin: Skin is warm.    ED Course  Procedures (including critical care time)  Labs Reviewed - No data to display No results found.   1. Chest pain   2. Dyspnea   3. Atrial fibrillation       MDM  Patient presents urgent care complaining of substernal chest pains shortness of breath both exertional and at rest. Patient is noted to be on pager fibrillation  and hypertension. Patient is noncompliant with medical care followup nor compliant with medications. Patient will be transferred to the emergency department for further evaluation and rule out acute cardiovascular event. Patient has a history of history fibrillation, cardiac heart failure: Hypertension. Patient cannot recall the names of her primary care Dr. nor her cardiologist which apparently she has seen in the past        Jimmie Molly, MD 02/17/12 1936

## 2012-02-17 NOTE — ED Notes (Signed)
Pt arrived via [facility] with C/o CP and SOB times 203 days.  Exertional Cp atthome.  CP free on arrival .

## 2012-07-22 ENCOUNTER — Encounter (HOSPITAL_COMMUNITY): Payer: Self-pay | Admitting: Emergency Medicine

## 2012-07-22 ENCOUNTER — Emergency Department (HOSPITAL_COMMUNITY)
Admission: EM | Admit: 2012-07-22 | Discharge: 2012-07-22 | Disposition: A | Discharge status: Home / Self Care | Payer: Self-pay | Attending: Emergency Medicine | Admitting: Emergency Medicine

## 2012-07-22 ENCOUNTER — Encounter (HOSPITAL_COMMUNITY): Payer: Self-pay | Admitting: *Deleted

## 2012-07-22 ENCOUNTER — Emergency Department (INDEPENDENT_AMBULATORY_CARE_PROVIDER_SITE_OTHER)
Admission: EM | Admit: 2012-07-22 | Discharge: 2012-07-22 | Disposition: A | Discharge status: Nursing Home (Includes ICF) | Payer: Self-pay | Source: Home / Self Care | Attending: Family Medicine | Admitting: Family Medicine

## 2012-07-22 DIAGNOSIS — I10 Essential (primary) hypertension: Secondary | ICD-10-CM

## 2012-07-22 DIAGNOSIS — S058X9A Other injuries of unspecified eye and orbit, initial encounter: Secondary | ICD-10-CM

## 2012-07-22 DIAGNOSIS — I509 Heart failure, unspecified: Secondary | ICD-10-CM | POA: Insufficient documentation

## 2012-07-22 DIAGNOSIS — H5710 Ocular pain, unspecified eye: Secondary | ICD-10-CM

## 2012-07-22 DIAGNOSIS — S0500XA Injury of conjunctiva and corneal abrasion without foreign body, unspecified eye, initial encounter: Secondary | ICD-10-CM

## 2012-07-22 DIAGNOSIS — H5789 Other specified disorders of eye and adnexa: Secondary | ICD-10-CM

## 2012-07-22 DIAGNOSIS — F17200 Nicotine dependence, unspecified, uncomplicated: Secondary | ICD-10-CM | POA: Insufficient documentation

## 2012-07-22 DIAGNOSIS — X58XXXA Exposure to other specified factors, initial encounter: Secondary | ICD-10-CM | POA: Insufficient documentation

## 2012-07-22 DIAGNOSIS — R739 Hyperglycemia, unspecified: Secondary | ICD-10-CM

## 2012-07-22 DIAGNOSIS — I4891 Unspecified atrial fibrillation: Secondary | ICD-10-CM | POA: Insufficient documentation

## 2012-07-22 LAB — CBC
HCT: 41.8 % (ref 36.0–46.0)
Hemoglobin: 14.2 g/dL (ref 12.0–15.0)
MCH: 28.4 pg (ref 26.0–34.0)
MCHC: 34 g/dL (ref 30.0–36.0)
MCV: 83.6 fL (ref 78.0–100.0)
RDW: 13.4 % (ref 11.5–15.5)

## 2012-07-22 LAB — POCT I-STAT, CHEM 8
Chloride: 102 mEq/L (ref 96–112)
HCT: 45 % (ref 36.0–46.0)
Potassium: 5 mEq/L (ref 3.5–5.1)

## 2012-07-22 LAB — BASIC METABOLIC PANEL
Calcium: 9.9 mg/dL (ref 8.4–10.5)
GFR calc Af Amer: >90 mL/min (ref >90)
Sodium: 137 mEq/L (ref 135–145)

## 2012-07-22 LAB — POCT URINALYSIS DIP (DEVICE)
Bilirubin Urine: NEGATIVE
Glucose, UA: 500 mg/dL — AB
Hgb urine dipstick: NEGATIVE
Leukocytes, UA: NEGATIVE
Nitrite: NEGATIVE

## 2012-07-22 LAB — URINALYSIS, ROUTINE W REFLEX MICROSCOPIC
Bilirubin Urine: NEGATIVE
Glucose, UA: 100 mg/dL — AB
Hgb urine dipstick: NEGATIVE
Protein, ur: NEGATIVE mg/dL
Urobilinogen, UA: 1 mg/dL (ref 0.0–1.0)

## 2012-07-22 LAB — GLUCOSE, CAPILLARY: Glucose-Capillary: 263 mg/dL — ABNORMAL HIGH (ref 70–99)

## 2012-07-22 MED ORDER — TETRACAINE HCL 0.5 % OP SOLN
OPHTHALMIC | Status: AC
  Filled 2012-07-22: qty 2

## 2012-07-22 MED ORDER — LISINOPRIL 20 MG PO TABS: 20.0000 mg | ORAL_TABLET | Freq: Every day | ORAL | Status: DC

## 2012-07-22 MED ORDER — OXYCODONE-ACETAMINOPHEN 5-325 MG PO TABS
1.0000 | ORAL_TABLET | Freq: Once | ORAL | Status: AC
  Administered 2012-07-22: 1 via ORAL
  Filled 2012-07-22: qty 1

## 2012-07-22 MED ORDER — OXYCODONE-ACETAMINOPHEN 5-325 MG PO TABS: 1.0000 | ORAL_TABLET | Freq: Four times a day (QID) | ORAL | Status: AC | PRN

## 2012-07-22 MED ORDER — ASPIRIN EC 325 MG PO TBEC: 325.0000 mg | DELAYED_RELEASE_TABLET | Freq: Every day | ORAL | Status: AC

## 2012-07-22 MED ORDER — OFLOXACIN 0.3 % OT SOLN: 2.0000 [drp] | Freq: Four times a day (QID) | OTIC | Status: AC

## 2012-07-22 NOTE — ED Provider Notes (Addendum)
History     CSN: [id number]  Arrival date & time 07/22/12  1540   First MD Initiated Contact with Patient 07/22/12 1606      Chief Complaint  Patient presents with  . Eye Pain  . Trx from [facility], hx Afib     (Consider location/radiation/quality/duration/timing/severity/associated sxs/prior treatment) HPI Comments: Kathleen Kane presents for evaluation of atrial fibrillation and an elevated blood sugar.  She states these were incidental findings at the urgent care where she presented this afternoon for evaluation of eye pain (she awoke this morning with right eye discomfort and redness).  She denies any prior history of diabetes, but states she hs been diagnosed with hypertension, obesity, atrial fibrillation, and congestive heart failure.  She denies any other complaints however other than eye discomfort.  She is currently unemployed and has no insurance benefits.  She has not seen a primary physician regularly in over 3 years.  Patient is a 51 y.o. female presenting with eye pain.  Eye Pain    Past Medical History  Diagnosis Date  . Atrial fibrillation   . CHF (congestive heart failure)   . Atrial fibrillation     Past Surgical History  Procedure Date  . Cesarean section   . Left breast cyst     History reviewed. No pertinent family history.  History  Substance Use Topics  . Smoking status: Current Every Day Smoker -- 1.0 packs/day  . Smokeless tobacco: Not on file  . Alcohol Use: No    OB History    Grav Para Term Preterm Abortions TAB SAB Ect Mult Living                  Review of Systems  Constitutional: Negative for fever, activity change, appetite change and fatigue.  HENT: Negative for ear pain, sore throat, facial swelling, rhinorrhea, trouble swallowing, neck pain and sinus pressure.   Eyes: Positive for pain, redness and itching. Negative for photophobia, discharge and visual disturbance.  Gastrointestinal: Negative for nausea, vomiting, diarrhea and  abdominal distention.  Genitourinary: Positive for frequency. Negative for dysuria, urgency, hematuria and difficulty urinating.  Musculoskeletal:       Foot swelling, usually at the end of the day  Neurological: Negative.   Hematological: Negative for adenopathy.  Psychiatric/Behavioral: Negative.     Allergies  Review of patient's allergies indicates no known allergies.  Home Medications   Current Outpatient Rx  Name Route Sig Dispense Refill  . FUROSEMIDE 20 MG PO TABS Oral Take 1 tablet (20 mg total) by mouth 2 (two) times daily. 14 tablet 0  . IBUPROFEN 600 MG PO TABS Oral Take 600 mg by mouth every 6 (six) hours as needed. For pain.    . OFLOXACIN 0.3 % OT SOLN Right Ear Place 2 drops into the right ear 4 (four) times daily. 5 mL 0    BP 159/112  Pulse 80  Temp 98.1 F (36.7 C) (Oral)  Resp 16  SpO2 98%  LMP 05/20/2012  Physical Exam  Nursing note and vitals reviewed. Constitutional: She is oriented to person, place, and time. She appears well-developed and well-nourished. No distress.  HENT:  Head: Normocephalic and atraumatic.  Right Ear: External ear normal.  Left Ear: External ear normal.  Nose: Nose normal.  Mouth/Throat: Oropharynx is clear and moist. No oropharyngeal exudate.  Eyes: EOM and lids are normal. Pupils are equal, round, and reactive to light. Right eye exhibits no chemosis, no discharge and no hordeolum. Left eye exhibits no  chemosis, no discharge and no hordeolum. Right conjunctiva is injected. Right conjunctiva has no hemorrhage. Left conjunctiva is not injected. Left conjunctiva has no hemorrhage. No scleral icterus. Right eye exhibits normal extraocular motion and no nystagmus. Left eye exhibits normal extraocular motion and no nystagmus. Right pupil is round and reactive. Left pupil is round and reactive. Pupils are equal.  Neck: Normal range of motion. Neck supple. No tracheal deviation present. No thyromegaly present.  Cardiovascular: Normal  rate, S1 normal, S2 normal, normal heart sounds, intact distal pulses and normal pulses.  An irregularly irregular rhythm present.  Occasional extrasystoles are present. PMI is not displaced.  Exam reveals no gallop, no friction rub and no decreased pulses.   No murmur heard. Pulmonary/Chest: Effort normal and breath sounds normal. No respiratory distress. She has no wheezes. She has no rales. She exhibits no tenderness.  Abdominal: Soft. Bowel sounds are normal. She exhibits no distension and no mass. There is no tenderness. There is no rebound and no guarding.  Musculoskeletal: Normal range of motion. She exhibits edema. She exhibits no tenderness.  Neurological: She is alert and oriented to person, place, and time. No cranial nerve deficit. Coordination normal.  Skin: Skin is warm and dry. No rash noted. She is not diaphoretic. No erythema. No pallor.  Psychiatric: She has a normal mood and affect. Her behavior is normal.    ED Course  Procedures (including critical care time)  Labs Reviewed  GLUCOSE, CAPILLARY - Abnormal; Notable for the following:    Glucose-Capillary 263 (*)     All other components within normal limits  BASIC METABOLIC PANEL  CBC  URINALYSIS, ROUTINE W REFLEX MICROSCOPIC   No results found.   No diagnosis found.    MDM  Pt presents as a referral from the [facility] for evaluation.  She initially presented for evaluation of eye pain and was found to have a corneal abrasion.  During her evaluation she was also found to be in atrial fibrillation and have an elevated blood sugar.  She denies any symptoms other than polyuria and states she has known about the atrial fibrillation for some time.  She has never been diagnosed with diabetes but reports she has a sister with type 2 DM that has had multiple related complications.  Pt's only current complaint is eye pain.  Although she has afib on exam, her rate is well below 100 and there is no evidence of acute ischemia on the  EKG obtained in the [facility] at 1412 (reviewed also study dated 02/17/12 which also demonstrates afib).  Plan basic labs and pain control.  Will likely restart basic medications and refer her to a primary physician and cardiologist for further evaluation and treatment if there are no issues in her labs that need to be addressed immediately (blood sugar on arrival 263).  1735.  Note a nl CBC and the only abnormal findings on U/A and BMP are elevated glucose levels.  Plan encourage follow-up with a local primary physician.  Will restart her on a blood pressure medication as she does have an elevated BP here in the ER.  I have discussed with her that she likely has type II dm and should follow-up for further testing and mgmnt.  Lastly, she has rate controlled atrial fibrillation but is not on any anticoagulation.  She has previously been instructed to take a full strength aspirin daily.  I will restart the aspirin and refer her to a cardiologist for further mgmnt, an outpt  echocardiogram, and likely anticoagulation.      Tobin Chad, MD 07/22/12 1743  Tobin Chad, MD 07/22/12 1745

## 2012-07-22 NOTE — ED Provider Notes (Signed)
History     CSN: [id number]  Arrival date & time 07/22/12  1201   None     Chief Complaint  Patient presents with  . Eye Pain    (Consider location/radiation/quality/duration/timing/severity/associated sxs/prior treatment) Patient is a 51 y.o. female presenting with eye pain. The history is provided by the patient.  Eye Pain  Patient reports right eye pain with tearing since awakening this morning. states has applied warm compresses.  Denies previous history of same.  Not diabetic, no previous eye surgery/trauma, denies cataracts or glaucoma. No floaters or flashing lights No vision loss + blurred vision + eye pain No eyelid itching + tearing No headache/scalp tenderness Does not wear contacts or glasses.   Patient request refill of blood pressure medications.  States she has been out of medications for at least 2 months.  Sob at night, no cough, bilateral edema lower extremities.  Does not have a physician related to affordability.   Past Medical History  Diagnosis Date  . Atrial fibrillation   . CHF (congestive heart failure)   . Atrial fibrillation     Past Surgical History  Procedure Date  . Cesarean section   . Left breast cyst     No family history on file.  History  Substance Use Topics  . Smoking status: Current Every Day Smoker -- 1.0 packs/day  . Smokeless tobacco: Not on file  . Alcohol Use: No    OB History    Grav Para Term Preterm Abortions TAB SAB Ect Mult Living                  Review of Systems  Constitutional: Negative.   Eyes: Positive for pain, discharge and redness. Negative for itching and visual disturbance.  Respiratory: Negative.   Cardiovascular: Negative.     Allergies  Review of patient's allergies indicates no known allergies.  Home Medications   Current Outpatient Rx  Name Route Sig Dispense Refill  . FUROSEMIDE 20 MG PO TABS Oral Take 1 tablet (20 mg total) by mouth 2 (two) times daily. 14 tablet 0  . IBUPROFEN  600 MG PO TABS Oral Take 600 mg by mouth every 6 (six) hours as needed. For pain.    Marland Kitchen AMLODIPINE BESYLATE 10 MG PO TABS Oral Take 1 tablet (10 mg total) by mouth daily. 15 tablet 0  . OFLOXACIN 0.3 % OT SOLN Right Ear Place 2 drops into the right ear 4 (four) times daily. 5 mL 0    BP 151/83  Pulse 100  Temp 98.5 F (36.9 C) (Oral)  Resp 18  SpO2 100%  LMP 05/20/2012  Physical Exam  Nursing note and vitals reviewed. Constitutional: She is oriented to person, place, and time. Vital signs are normal. She appears well-developed and well-nourished. She is active and cooperative.  HENT:  Head: Normocephalic and atraumatic.  Right Ear: Hearing, tympanic membrane, external ear and ear canal normal.  Left Ear: Hearing, external ear and ear canal normal.  Nose: Right sinus exhibits no maxillary sinus tenderness and no frontal sinus tenderness. Left sinus exhibits no maxillary sinus tenderness and no frontal sinus tenderness.       Left side: cerumen impacted, no visual of TM  Eyes: EOM and lids are normal. Pupils are equal, round, and reactive to light. No foreign bodies found. Right eye exhibits no discharge, no exudate and no hordeolum. No foreign body present in the right eye. Left eye exhibits no discharge, no exudate and no hordeolum. No foreign  body present in the left eye. Right conjunctiva is injected. Right conjunctiva has no hemorrhage. Left conjunctiva is not injected. Left conjunctiva has no hemorrhage. No scleral icterus.         Tetracaine and fluoroscien applied to right eye, examined with woods lamp.  Right corneal abrasion noted, no foreign body found.  Pt tolerated procedure well  Neck: Trachea normal and normal range of motion. Neck supple.  Cardiovascular: Normal rate, regular rhythm, normal heart sounds, intact distal pulses and normal pulses.        Bilateral lower extremity edema  Pulmonary/Chest: Effort normal and breath sounds normal.  Abdominal: Soft. Bowel sounds are  normal. There is no tenderness.  Lymphadenopathy:    She has no cervical adenopathy.  Neurological: She is alert and oriented to person, place, and time. No cranial nerve deficit or sensory deficit. GCS eye subscore is 4. GCS verbal subscore is 5. GCS motor subscore is 6.  Skin: Skin is warm and dry.  Psychiatric: She has a normal mood and affect. Her speech is normal and behavior is normal. Judgment and thought content normal. Cognition and memory are normal.    ED Course  Procedures (including critical care time)  Labs Reviewed  POCT URINALYSIS DIP (DEVICE) - Abnormal; Notable for the following:    Glucose, UA 500 (*)     All other components within normal limits  POCT I-STAT, CHEM 8 - Abnormal; Notable for the following:    Glucose, Bld 270 (*)     Hemoglobin 15.3 (*)     All other components within normal limits   No results found.   1. Eye redness   2. Corneal abrasion   3. Hypertension       MDM  Antibiotics sent to pharmacy for corneal abrasion.  Patient however discussed with Dr. Bunnie Philips.    Transfer to [facility] for further evaluation and treatment of atrial fibrillation.  Patient has known hx of same in additional to chf. Has been out of medication for two months, none includes anticoagulants, does not have primary care provider.   Johnsie Kindred, NP 07/24/12 1017

## 2012-07-22 NOTE — ED Notes (Signed)
Pt c/o right eye pain since 6:00am... She believes she has a foreign object inside.. Sx include: irritation, pain blurry vision... Denies: fever, vomiting, diarrhea, and nausea

## 2012-07-22 NOTE — ED Notes (Addendum)
Patient sent from urgent care for further evaluation of right eye pain and drainage since this am.  Patient was seen at [facility] for it and transferred to ed because she has history of A-Fib and has not been taking her medication for two weeks.  Patient denies any chest pain.  Patient also mentioned her blood sugar was high at Urgent care and patient states that she has not been diagnosed with DM.  Patient very vague about her history of complains.  States that she wants something for her eye pain

## 2012-07-26 NOTE — ED Provider Notes (Signed)
Medical screening examination/treatment/procedure(s) were performed by non-physician practitioner and as supervising physician I was immediately available for consultation/collaboration.   [facility]; MD   Sharin Grave, MD 07/26/12 0100

## 2012-08-09 ENCOUNTER — Encounter: Payer: Self-pay | Admitting: Cardiovascular Disease

## 2012-08-09 ENCOUNTER — Encounter: Payer: Self-pay | Admitting: *Deleted

## 2012-08-09 DIAGNOSIS — I4891 Unspecified atrial fibrillation: Secondary | ICD-10-CM | POA: Insufficient documentation

## 2012-08-09 DIAGNOSIS — I5032 Chronic diastolic (congestive) heart failure: Secondary | ICD-10-CM | POA: Insufficient documentation

## 2012-08-10 ENCOUNTER — Ambulatory Visit (INDEPENDENT_AMBULATORY_CARE_PROVIDER_SITE_OTHER): Payer: Self-pay | Admitting: Cardiovascular Disease

## 2012-08-10 ENCOUNTER — Encounter: Payer: Self-pay | Admitting: Cardiovascular Disease

## 2012-08-10 VITALS — BP 139/79 | HR 84 | Wt 233.0 lb

## 2012-08-10 DIAGNOSIS — E119 Type 2 diabetes mellitus without complications: Secondary | ICD-10-CM

## 2012-08-10 DIAGNOSIS — I509 Heart failure, unspecified: Secondary | ICD-10-CM

## 2012-08-10 DIAGNOSIS — R7309 Other abnormal glucose: Secondary | ICD-10-CM

## 2012-08-10 DIAGNOSIS — I4891 Unspecified atrial fibrillation: Secondary | ICD-10-CM

## 2012-08-10 DIAGNOSIS — R739 Hyperglycemia, unspecified: Secondary | ICD-10-CM

## 2012-08-10 HISTORY — DX: Type 2 diabetes mellitus without complications: E11.9

## 2012-08-10 NOTE — Patient Instructions (Addendum)
Your physician recommends that you schedule a follow-up appointment in: AS NEEDED You have been referred to [facility] DIABETES EDUCATION

## 2012-08-10 NOTE — Assessment & Plan Note (Signed)
Called Dr Henderson Cloud office and they will try to help her get a glumometer and teach her how to use it.  Will get diabetes education if possible.  Also refer to [facility] family practice to see if primary care f/u there would be cheaper.  She is taking glucophage and given her lack of insight and testing supplies it would be hard to add other medicine at this time

## 2012-08-10 NOTE — Assessment & Plan Note (Signed)
Possibly diastolic Patient does not want diagnositic testing due to cost.  Continue Rx for BP and current lasix dose

## 2012-08-10 NOTE — Progress Notes (Signed)
Patient ID: Kathleen Kane, female   DOB: 08-Apr-1961, 51 y.o.   MRN: [id number] 51 yo obese black female referred by ER  Previously seen by Dr Allyson Sabal Those records not available  Has chronic afib.  Not on anticoagulation  Recently diagnosed with diabetes with BS over 500.  She has limited financial means and no insight into how to take care of herself.  Still has no glucometer and has not made F/U appt with Dr Mikeal Hawthorne due to cost and thinks she may try to see Dr Eulis Manly.  Currently no chest pain, palpitations or syncope.  Gets other scripts for 9 $ at [facility]  She says she has CHF but echo in 2009 showed EF 60-65%  Tried to discuss low carb diet and issues with following BS but she has limited ability to comprehend  Discussed issues of anticoagulation.  Newer agents too expensive and she does not think she could F/U in coumadin clinic regularly and I have doubts about her ability to monitor INR  ROS: Denies fever, malais, weight loss, blurry vision, decreased visual acuity, cough, sputum, SOB, hemoptysis, pleuritic pain, palpitaitons, heartburn, abdominal pain, melena, lower extremity edema, claudication, or rash.  All other systems reviewed and negative   General: Affect appropriate Obese black female HEENT: normal Neck supple with no adenopathy JVP normal no bruits no thyromegaly Lungs clear with no wheezing and good diaphragmatic motion Heart:  S1/S2 no murmur,rub, gallop or click PMI normal Abdomen: benighn, BS positve, no tenderness, no AAA no bruit.  No HSM or HJR Distal pulses intact with no bruits No edema Neuro non-focal Skin warm and dry No muscular weakness  Medications Current Outpatient Prescriptions  Medication Sig Dispense Refill  . Ascorbic Acid (VITAMIN C) 1000 MG tablet Take 1,000 mg by mouth daily.      Marland Kitchen aspirin 325 MG tablet Take 325 mg by mouth daily.      . B Complex-C (SUPER B COMPLEX PO) Take 1 tablet by mouth daily.      . Biotin 1000 MCG tablet Take 1,000 mcg by mouth  daily.      . Calcium Carbonate-Vitamin D (CALCIUM + D PO) Take 1 tablet by mouth daily.      . Coenzyme Q10 (CO Q 10 PO) Take 1 tablet by mouth daily.      . furosemide (LASIX) 20 MG tablet Take 1 tablet (20 mg total) by mouth 2 (two) times daily.  14 tablet  0  . ibuprofen (ADVIL,MOTRIN) 600 MG tablet Take 600 mg by mouth every 6 (six) hours as needed. For pain.      Marland Kitchen lisinopril (PRINIVIL,ZESTRIL) 20 MG tablet Take 1 tablet (20 mg total) by mouth daily.  30 tablet  0  . METFORMIN HCL PO Take 1 tablet by mouth daily.      . Multiple Vitamins-Calcium (ONE-A-DAY WOMENS PO) Take 1 tablet by mouth daily.      . Multiple Vitamins-Minerals (CENTRUM SILVER PO) Take 1 tablet by mouth daily.      Ailene Ards Fatty Acids-Vitamins (OMEGA-3 GUMMIES PO) Take 1 tablet by mouth daily.      . Omega-3 Fatty Acids (FISH OIL PO) Take 1 tablet by mouth daily.      . Prenatal Vit-Fe Fumarate-FA (PRENATAL PO) Take 1 tablet by mouth daily.      . vitamin E 1000 UNIT capsule Take 1,000 Units by mouth daily.        Allergies Review of patient's allergies indicates no known allergies.  Family History: No family history on file.  Social History: History   Social History  . Marital Status: Legally Separated    Spouse Name: N/A    Number of Children: N/A  . Years of Education: N/A   Occupational History  . Not on file.   Social History Main Topics  . Smoking status: Current Every Day Smoker -- 1.0 packs/day  . Smokeless tobacco: Not on file  . Alcohol Use: No  . Drug Use: No  . Sexually Active:    Other Topics Concern  . Not on file   Social History Narrative  . No narrative on file    Electrocardiogram:  9/14  afib with nospecific ST T wave changes  Assessment and Plan

## 2012-08-10 NOTE — Assessment & Plan Note (Signed)
Rate control  is fine [place] score is CHADS2 score 3 Should be on anticoagulation but not an ideal candidate because of finances and F/U ability.  She will call us if she wants to initiate coumadin and see our clinic regularly.  Risk of stroke with afib discussed

## 2012-09-25 ENCOUNTER — Ambulatory Visit: Payer: Self-pay | Admitting: *Deleted

## 2013-01-06 ENCOUNTER — Encounter (HOSPITAL_COMMUNITY): Payer: Self-pay

## 2013-01-06 ENCOUNTER — Emergency Department (HOSPITAL_COMMUNITY)
Admission: EM | Admit: 2013-01-06 | Discharge: 2013-01-07 | Disposition: A | Discharge status: Home / Self Care | Payer: Medicaid Other | Attending: Emergency Medicine | Admitting: Emergency Medicine

## 2013-01-06 DIAGNOSIS — R109 Unspecified abdominal pain: Secondary | ICD-10-CM | POA: Insufficient documentation

## 2013-01-06 DIAGNOSIS — R1909 Other intra-abdominal and pelvic swelling, mass and lump: Secondary | ICD-10-CM | POA: Insufficient documentation

## 2013-01-06 DIAGNOSIS — Z79899 Other long term (current) drug therapy: Secondary | ICD-10-CM | POA: Insufficient documentation

## 2013-01-06 DIAGNOSIS — I10 Essential (primary) hypertension: Secondary | ICD-10-CM | POA: Insufficient documentation

## 2013-01-06 DIAGNOSIS — F17200 Nicotine dependence, unspecified, uncomplicated: Secondary | ICD-10-CM | POA: Insufficient documentation

## 2013-01-06 DIAGNOSIS — I509 Heart failure, unspecified: Secondary | ICD-10-CM | POA: Insufficient documentation

## 2013-01-06 DIAGNOSIS — E119 Type 2 diabetes mellitus without complications: Secondary | ICD-10-CM | POA: Insufficient documentation

## 2013-01-06 DIAGNOSIS — R112 Nausea with vomiting, unspecified: Secondary | ICD-10-CM | POA: Insufficient documentation

## 2013-01-06 DIAGNOSIS — R1900 Intra-abdominal and pelvic swelling, mass and lump, unspecified site: Secondary | ICD-10-CM

## 2013-01-06 DIAGNOSIS — Z8679 Personal history of other diseases of the circulatory system: Secondary | ICD-10-CM | POA: Insufficient documentation

## 2013-01-06 DIAGNOSIS — Z7982 Long term (current) use of aspirin: Secondary | ICD-10-CM | POA: Insufficient documentation

## 2013-01-06 HISTORY — DX: Essential (primary) hypertension: I10

## 2013-01-06 HISTORY — DX: Type 2 diabetes mellitus without complications: E11.9

## 2013-01-06 LAB — CBC WITH DIFFERENTIAL/PLATELET
Basophils Absolute: 0 10*3/uL (ref 0.0–0.1)
Eosinophils Absolute: 0.1 10*3/uL (ref 0.0–0.7)
Eosinophils Relative: 2 % (ref 0–5)
HCT: 37.5 % (ref 36.0–46.0)
Lymphocytes Relative: 14 % (ref 12–46)
Lymphs Abs: 1.1 10*3/uL (ref 0.7–4.0)
MCH: 29.7 pg (ref 26.0–34.0)
MCV: 85.8 fL (ref 78.0–100.0)
Monocytes Absolute: 0.3 10*3/uL (ref 0.1–1.0)
RDW: 13.3 % (ref 11.5–15.5)
WBC: 7.9 10*3/uL (ref 4.0–10.5)

## 2013-01-06 LAB — COMPREHENSIVE METABOLIC PANEL
CO2: 23 mEq/L (ref 19–32)
Calcium: 9.6 mg/dL (ref 8.4–10.5)
Creatinine, Ser: 0.55 mg/dL (ref 0.50–1.10)
GFR calc Af Amer: >90 mL/min (ref >90)
GFR calc non Af Amer: >90 mL/min (ref >90)
Glucose, Bld: 128 mg/dL — ABNORMAL HIGH (ref 70–99)
Total Protein: 7.7 g/dL (ref 6.0–8.3)

## 2013-01-06 LAB — LIPASE, BLOOD: Lipase: 19 U/L (ref 11–59)

## 2013-01-06 MED ORDER — HYDROCODONE-ACETAMINOPHEN 5-325 MG PO TABS: 2.0000 | ORAL_TABLET | ORAL | Status: DC | PRN

## 2013-01-06 MED ORDER — SODIUM CHLORIDE 0.9 % IV BOLUS (SEPSIS)
500.0000 mL | Freq: Once | INTRAVENOUS | Status: AC
  Administered 2013-01-06: 23:00:00 via INTRAVENOUS

## 2013-01-06 MED ORDER — FENTANYL CITRATE 0.05 MG/ML IJ SOLN
100.0000 ug | Freq: Once | INTRAMUSCULAR | Status: AC
  Administered 2013-01-06: 100 ug via INTRAVENOUS
  Filled 2013-01-06: qty 2

## 2013-01-06 MED ORDER — ONDANSETRON 4 MG PO TBDP
8.0000 mg | ORAL_TABLET | Freq: Once | ORAL | Status: AC
  Administered 2013-01-06: 8 mg via ORAL

## 2013-01-06 MED ORDER — PROMETHAZINE HCL 25 MG PO TABS: 25.0000 mg | ORAL_TABLET | Freq: Four times a day (QID) | ORAL | Status: DC | PRN

## 2013-01-06 MED ORDER — IOHEXOL 300 MG/ML  SOLN
20.0000 mL | INTRAMUSCULAR | Status: AC
  Administered 2013-01-06: 50 mL via ORAL

## 2013-01-06 MED ORDER — ONDANSETRON 4 MG PO TBDP
ORAL_TABLET | ORAL | Status: AC
  Filled 2013-01-06: qty 2

## 2013-01-06 MED ORDER — ONDANSETRON HCL 4 MG/2ML IJ SOLN
4.0000 mg | Freq: Once | INTRAMUSCULAR | Status: AC
  Administered 2013-01-06: 4 mg via INTRAVENOUS
  Filled 2013-01-06: qty 2

## 2013-01-06 NOTE — ED Notes (Signed)
IV attempted. Unsuccessful. IV team paged.  

## 2013-01-06 NOTE — ED Provider Notes (Addendum)
History     CSN: [id number]  Arrival date & time 01/06/13  1758   First MD Initiated Contact with Patient 01/06/13 2119      Chief Complaint  Patient presents with  . Abdominal Pain  . Nausea     HPI Patient presents with chief complaint of periumbilical abdominal pain associated with nausea and vomiting.  Pain greater on the right than the left.  Has had history of similar complaints once in the past.  Has past history of diabetes and hypertension.  Denies diarrhea or constipation.  Denies melena hematochezia.  Denies fever.  Denies chest pain or arm pain. Past Medical History  Diagnosis Date  . Atrial fibrillation   . CHF (congestive heart failure)   . Atrial fibrillation   . Diabetes mellitus without complication   . Hypertension     Past Surgical History  Procedure Laterality Date  . Cesarean section    . Left breast cyst      History reviewed. No pertinent family history.  History  Substance Use Topics  . Smoking status: Current Every Day Smoker -- 1.00 packs/day  . Smokeless tobacco: Not on file  . Alcohol Use: No    OB History   Grav Para Term Preterm Abortions TAB SAB Ect Mult Living                  Review of Systems All other systems reviewed and are negative Allergies  Review of patient's allergies indicates no known allergies.  Home Medications   Current Outpatient Rx  Name  Route  Sig  Dispense  Refill  . aspirin 325 MG tablet   Oral   Take 325 mg by mouth daily.         . furosemide (LASIX) 20 MG tablet   Oral   Take 20 mg by mouth daily.         Marland Kitchen ibuprofen (ADVIL,MOTRIN) 200 MG tablet   Oral   Take 800 mg by mouth every 8 (eight) hours as needed for pain.         Marland Kitchen lisinopril (PRINIVIL,ZESTRIL) 20 MG tablet   Oral   Take 1 tablet (20 mg total) by mouth daily.   30 tablet   0   . metFORMIN (GLUCOPHAGE) 500 MG tablet   Oral   Take 500 mg by mouth at bedtime.         Marland Kitchen HYDROcodone-acetaminophen (NORCO/VICODIN) 5-325 MG  per tablet   Oral   Take 2 tablets by mouth every 4 (four) hours as needed for pain.   10 tablet   0   . promethazine (PHENERGAN) 25 MG tablet   Oral   Take 1 tablet (25 mg total) by mouth every 6 (six) hours as needed for nausea.   20 tablet   0     BP 141/82  Pulse 107  Temp(Src) 100 F (37.8 C) (Oral)  Resp 16  Ht 5\' 2"  (1.575 m)  Wt 240 lb (108.863 kg)  BMI 43.89 kg/m2  SpO2 97%  LMP 11/20/2012  Physical Exam  Nursing note and vitals reviewed. Constitutional: She is oriented to person, place, and time. She appears well-developed and well-nourished. No distress.  HENT:  Head: Normocephalic and atraumatic.  Eyes: Pupils are equal, round, and reactive to light.  Neck: Normal range of motion.  Cardiovascular: Normal rate and intact distal pulses.   Pulmonary/Chest: No respiratory distress.  Abdominal: Normal appearance. She exhibits no distension. There is tenderness in the right lower  quadrant and periumbilical area. There is positive Murphy's sign. There is no rigidity and no rebound.  Musculoskeletal: Normal range of motion.  Neurological: She is alert and oriented to person, place, and time. No cranial nerve deficit.  Skin: Skin is warm and dry. No rash noted.  Psychiatric: She has a normal mood and affect. Her behavior is normal.    ED Course  Procedures (including critical care time) 01/06/13 2130  fentaNYL (SUBLIMAZE) injection 100 mcg Once Discontinue   01/06/13 2130  ondansetron (ZOFRAN) injection 4 mg Once Discontinue   01/06/13 2130  sodium chloride 0.9 % bolus 500 mL Once Discontinue    Labs Reviewed  CBC WITH DIFFERENTIAL - Abnormal; Notable for the following:    Neutrophils Relative 81 (*)    All other components within normal limits  COMPREHENSIVE METABOLIC PANEL - Abnormal; Notable for the following:    Glucose, Bld 128 (*)    Total Bilirubin 0.2 (*)    All other components within normal limits  LIPASE, BLOOD  CBC WITH DIFFERENTIAL   No  results found.   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  Subpleural nodular density right lower lobe posteriorly measures 8 mm. Recommend a non emergent chest CT to evaluate the lungs in their entirety.   Original Report Authenticated By: Jearld Lesch, M.D.    1. Abdominal pain   2. Nausea and vomiting   3. Pelvic mass       MDM  Dr. Oletta Lamas discussed findings with patient and need to follow up with her gynecologist and her family physician.  Discussed cancer and a possibility. She understands and will follow up.Nelia Shi, MD 01/09/13 0031  Nelia Shi, MD 01/09/13 [phone]

## 2013-01-06 NOTE — ED Notes (Signed)
IV team at bedside 

## 2013-01-06 NOTE — ED Notes (Signed)
Pt c/o abd pain, nausea, vomiting. Pt states that pain radiates into both sides, right>left.

## 2013-01-07 ENCOUNTER — Encounter (HOSPITAL_COMMUNITY): Payer: Self-pay | Admitting: Radiology

## 2013-01-07 ENCOUNTER — Emergency Department (HOSPITAL_COMMUNITY): Payer: Medicaid Other

## 2013-01-07 MED ORDER — MORPHINE SULFATE 4 MG/ML IJ SOLN
4.0000 mg | Freq: Once | INTRAMUSCULAR | Status: AC
  Administered 2013-01-07: 4 mg via INTRAVENOUS
  Filled 2013-01-07: qty 1

## 2013-01-07 MED ORDER — IOHEXOL 300 MG/ML  SOLN
100.0000 mL | Freq: Once | INTRAMUSCULAR | Status: AC | PRN
  Administered 2013-01-07: 100 mL via INTRAVENOUS

## 2013-01-07 NOTE — ED Provider Notes (Addendum)
I reviewed the abdominal CT scan with the patient and family and that concern for cancer is present. I spoke to the on-call gynecologist who recommends that the patient goes to [facility] hospital clinic. Patient and family are informed her that she call first thing Monday morning to be plugged into clinic as soon as possible. Pain prescription will be provided as well.  Gavin Pound. Oletta Lamas, MD 01/07/13 3086  Gavin Pound. Oletta Lamas, MD 01/07/13 [phone]

## 2013-01-29 ENCOUNTER — Ambulatory Visit (INDEPENDENT_AMBULATORY_CARE_PROVIDER_SITE_OTHER): Payer: Self-pay | Admitting: Obstetrics & Gynecology

## 2013-01-29 VITALS — BP 130/74 | HR 85 | Ht 62.0 in | Wt 213.6 lb

## 2013-01-29 DIAGNOSIS — R1900 Intra-abdominal and pelvic swelling, mass and lump, unspecified site: Secondary | ICD-10-CM

## 2013-01-29 HISTORY — DX: Intra-abdominal and pelvic swelling, mass and lump, unspecified site: R19.00

## 2013-01-29 NOTE — Patient Instructions (Signed)
Pelvic Mass A "mass" is a lump that either your caregiver found during an examination or you found before seeing your caregiver. The "pelvis" is the lower portion of the trunk in between the hip bones. There are many possible reasons why a lump has appeared. Testing will help determine the cause and the steps to a solution. CAUSES  Before complete testing is done, it may be difficult or impossible for your caregiver to know if the lump is truly in one of the pelvic organs (such as the uterus or ovaries) or is coming from one of many organs that are near the pelvis. Problems in the colon or kidney can also lead to a lump that might seem to be in the pelvis. If testing shows that the mass is in the pelvis, there are still many possible causes:  Tumors and cancers. These problems are relatively common and are the greatest source of worry for patients. Cancerous lumps in the pelvis may be due to cancers that started in the uterus or ovaries or due to cancers that started in other areas and then spread to the pelvis. Many cancers are very treatable when found early.  Non-cancerous tumors and masses. There are a large number of common and uncommon non-cancerous problems that can lead to a mass in the pelvis. Two very common ones are fibroids of the uterus and ovarian cysts. Before testing and/or surgery, it may be impossible to tell the difference between these problems and a cancer.  Infection. Certain types of infections can produce a mass in the pelvis. The infection might be caused by bacteria. If there is an infection treatment might include antibiotics. Masses from infection can also be caused by certain viruses, and in rare cases, by fungi or parasites. If infection is the cause, your caregiver will be able to determine the type of germ responsible for the mass by doing appropriate testing.  Inflammatory bowel disease. These are diseases thought to be caused by a defect in the immune system of the  intestine. There are two inflammatory bowel diseases: Ulcerative Colitis and Crohn's Disease. They are lifelong problems with symptoms that can come and go. Sometimes, patients with these diseases will develop a mass in the lower part of the colon that can make it seem as though there is a mass in the pelvis.  Past Surgery. If there has been pelvic surgery in the past, and there is a lot of scarring that forms during the process of healing, this can eventually fell like a mass when examined by your caregiver. As with the other problems described above, this may or may not be associated with symptoms or feeling badly.  Ectopic Pregnancy. This is a condition where the growing fetus is growing outside of the uterus. This is a common cause for a pelvic mass and may become a serious or life-threatening problem that requires immediate surgery. SYMPTOMS  In people with a pelvic mass, there may be a large variety of associated symptoms including:   No symptoms, other than the appearance of the mass itself.  Cramping, nausea, diarrhea.  Fever, vomiting, weakness.  Pelvic, side, and/or back pain.  Weight loss.  Constipation.  Problems with vaginal bleeding. This can be very variable. Bleeding might be very light or very heavy. Bleeding may be mixed with large clots. Menstruation may be very frequent and may seem to almost completely stop. There may be varying levels of pain with menstruation.  Urinary symptoms including frequent urination, inability to empty the   bladder completely, or urinating very small amounts. DIAGNOSIS  Because of the large number of causes of a mass in the pelvis, your caregiver will ask you to undergo testing in order to get a clear diagnosis in a timely manner. The tests might include some or all of the following:  Blood tests such as a blood count, measurement of common minerals in the blood, kidney/liver/pancreas function, pregnancy test, and others.  X-rays. Plain x-rays  and special x-rays may be requested except if you are pregnant.  Ultrasound. This is a test that uses sound waves to "paint a picture" of the mass. The type of "sound picture" that is seen can help to narrow the diagnosis.  CAT scan and MRI imaging. Each can provide additional information as to the different characteristics of the mass and can help to develop a final plan for diagnostics and treatment. If cancer is suspected, these special tests can also help to show any spread of the cancer to other parts of the body. It is possible that these tests may not be ordered if you are pregnant.  Laparoscopy. This is a special exam of the inside of the pelvic area using a slim, flexible, lighted tube. This allows your caregiver to get a direct look at the mass. Sometimes, this allows getting a very small piece of the mass (a biopsy). This piece of tissue can then be examined in a lab that will frequently lead to a clear diagnosis. In some cases, your caregiver can use a laparoscope to completely remove the mass after it has been examined.  Surgery. Sometimes, a diagnosis can only be made by carrying out an operation and obtaining a biopsy (as noted above). Many times, the biopsy is obtained and the mass is removed during the same operation. These are the most common ways for determining the exact cause of the mass. Your caregiver may recommend other tests that are not listed here. TREATMENT  Treatment(s) can only be recommended after a diagnosis is made. Your caregiver will discuss your test results with you, the meanings of the tests, and the recommended steps to begin treatment. He/she will also recommend whether you need to be examined by specialists as you go through the steps of diagnosis and a treatment plan is developed. HOME CARE INSTRUCTIONS   Test preparation. Carefully follow instructions when preparing for certain tests. This may involve many things such as:  Drinking fluids to fill the bladder  before a pelvic ultrasound.  Fasting before certain blood tests.  Drinking special "contrast" fluids that are necessary for obtaining the best CAT scan and MRI images.  Medications. Your caregiver may prescribe medications to help relieve symptoms while you undergo testing. It is important that your current medications (prescription, non-prescription, herbal, vitamins, etc.) be kept in mind when new prescriptions are recommended.  Diet. There may be a need for changes in diet in order to help with symptom relief while testing is being done. If this applies to you, your caregiver will discuss these changes with you. SEEK MEDICAL CARE IF:   You cannot hold down any of the recommended fluids used to prepare for tests such as CAT scan MRI.  You feel that you are having trouble with any new prescriptions.  You develop new symptoms of pain, vomiting, diarrhea, fever, or other problems that you did not feel since your last exam.  You experience inability to empty your bladder completely or develop painful and/or bloody urination. SEEK IMMEDIATE MEDICAL CARE IF:     You vomit bright red blood, or a coffee ground appearing material.  You have blood in your stools, or the stools turn black and tarry.  You have an abnormal or increased amount of vaginal bleeding.  You have a fever.  You develop easy bruising or bleeding.  You develop pain that is not controlled by your medication.  You feel worsening weakness or you have a fainting episode.  You feel that the mass has suddenly gotten larger.  You develop severe bloating in the abdomen and/or pelvis.  You cannot pass any urine. MAKE SURE YOU:   Understand these instructions.  Will watch your condition.  Will get help right away if you are not doing well or get worse. Document Released: 02/01/2007 Document Revised: 01/17/2012 Document Reviewed: 10/10/2007 Exit[employer] Patient Information 2013 [employer].  

## 2013-01-29 NOTE — Progress Notes (Signed)
Patient ID: Kathleen Kane, female   DOB: 01-26-1961, 52 y.o.   MRN: [id number]  Chief Complaint  Patient presents with  . Follow-up    mass seen on CT    HPI Kathleen Kane is a 52 y.o. female.  Referred by [facility] ED 01/07/13 with large pelvic mass. Patient's last menstrual period was 11/20/2012.    HPI  Past Medical History  Diagnosis Date  . Atrial fibrillation   . CHF (congestive heart failure)   . Atrial fibrillation   . Diabetes mellitus without complication   . Hypertension     Past Surgical History  Procedure Laterality Date  . Cesarean section    . Left breast cyst      No family history on file.  Social History History  Substance Use Topics  . Smoking status: Current Every Day Smoker -- 1.00 packs/day  . Smokeless tobacco: Not on file  . Alcohol Use: No    No Known Allergies  Current Outpatient Prescriptions  Medication Sig Dispense Refill  . aspirin 325 MG tablet Take 325 mg by mouth daily.      . furosemide (LASIX) 20 MG tablet Take 20 mg by mouth daily.      Marland Kitchen ibuprofen (ADVIL,MOTRIN) 200 MG tablet Take 800 mg by mouth every 8 (eight) hours as needed for pain.      Marland Kitchen lisinopril (PRINIVIL,ZESTRIL) 20 MG tablet Take 1 tablet (20 mg total) by mouth daily.  30 tablet  0  . metFORMIN (GLUCOPHAGE) 500 MG tablet Take 500 mg by mouth at bedtime.      Marland Kitchen HYDROcodone-acetaminophen (NORCO/VICODIN) 5-325 MG per tablet Take 2 tablets by mouth every 4 (four) hours as needed for pain.  10 tablet  0  . promethazine (PHENERGAN) 25 MG tablet Take 1 tablet (25 mg total) by mouth every 6 (six) hours as needed for nausea.  20 tablet  0   No current facility-administered medications for this visit.    Review of Systems Review of Systems  Constitutional: Negative for fever, activity change and unexpected weight change.  Gastrointestinal: Positive for abdominal pain. Negative for vomiting and constipation.  Genitourinary: Negative for urgency and menstrual problem.   Musculoskeletal: Arthralgias: both hands.    Blood pressure 130/74, pulse 85, height 5\' 2"  (1.575 m), weight 213 lb 9.6 oz (96.888 kg), last menstrual period 11/20/2012.  Physical Exam Physical Exam  Constitutional: No distress.  obese  Pulmonary/Chest: Effort normal. No respiratory distress.  Abdominal: She exhibits no mass. There is no tenderness.  Periumbilical firm 15 cm mass  Genitourinary: Vagina normal. No vaginal discharge found.    Large mass above uterine fundus 15 cm  Pap done, cervix nl  Musculoskeletal: She exhibits no edema.  Skin: Skin is warm.  Psychiatric: She has a normal mood and affect. Her behavior is normal.    Data Reviewed *RADIOLOGY REPORT*  Clinical Data: Mid abdominal pain  CT ABDOMEN AND PELVIS WITH CONTRAST  Technique: Multidetector CT imaging of the abdomen and pelvis was  performed following the standard protocol during bolus  administration of intravenous contrast.  Contrast: OMNIPAQUE IOHEXOL 300 MG/ML SOLN  Comparison: None.  Findings: Subpleural nodular density right lower lobe posteriorly  measures 8 mm on series 3 image 10. Heart size upper normal. No  pleural or pericardial effusion.  Low attenuation of the liver suggests fatty infiltration. There is  a nonspecific hypodensity within segment four, sub centimeter.  Otherwise, homogeneous hepatic enhancement.  Unremarkable spleen, pancreas, biliary system,  adrenal glands,  kidneys. No hydronephrosis or hydroureter.  No bowel obstruction. Mild colonic diverticulosis. No CT evidence  for colitis or diverticulitis. Appendix within normal limits. No  free intraperitoneal air.  There is a small amount of ascites, measuring water attenuation.  There scattered interloop fluid pockets, for example on series 2  images 50 and 46.  Centered within the right pelvis, there is a heterogeneously  enhancing mixed solid and cystic mass, measuring 14.9 x 16.8 x 13.0  cm. There are complex internal  septations and nodularity. The  mass is inseparable from the right ovary. The left ovary is likely  within normal limits on series 2 image 67.  The uterus is enlarged and heterogeneous enhancement, suggesting  multiple underlying fibroids.  Thin-walled bladder.  There is scattered atherosclerotic calcification of the aorta and  its branches. No aneurysmal dilatation.  Multilevel degenerative changes of the imaged spine. No acute or  aggressive appearing osseous lesion.  IMPRESSION:  16.8 cm complex right pelvic mass, most in keeping with ovarian  carcinoma. Intraperitoneal fluid attenuation may reflect  gelatinous ascites/pseudomyxoma peritonei.  Subpleural nodular density right lower lobe posteriorly measures 8  mm. Recommend a non emergent chest CT to evaluate the lungs in  their entirety.  Original Report Authenticated By: Jearld Lesch, M.D.   Assessment    abdomino-pelvic mass and ascites, suspicious for ovarian CA     Plan    CA 125 Gyn onc referral        Alireza Pollack 01/29/2013, 3:38 PM

## 2013-01-30 ENCOUNTER — Encounter: Payer: Self-pay | Admitting: Obstetrics & Gynecology

## 2013-01-30 LAB — CA 125: CA 125: 55.3 U/mL — ABNORMAL HIGH (ref 0.0–30.2)

## 2013-01-31 ENCOUNTER — Ambulatory Visit (HOSPITAL_COMMUNITY)
Admission: RE | Admit: 2013-01-31 | Discharge: 2013-01-31 | Disposition: A | Discharge status: Home / Self Care | Payer: Medicaid Other | Source: Ambulatory Visit | Attending: Obstetrics & Gynecology | Admitting: Obstetrics & Gynecology

## 2013-01-31 DIAGNOSIS — R1900 Intra-abdominal and pelvic swelling, mass and lump, unspecified site: Secondary | ICD-10-CM

## 2013-01-31 DIAGNOSIS — D251 Intramural leiomyoma of uterus: Secondary | ICD-10-CM | POA: Insufficient documentation

## 2013-01-31 DIAGNOSIS — N9489 Other specified conditions associated with female genital organs and menstrual cycle: Secondary | ICD-10-CM | POA: Insufficient documentation

## 2013-02-01 ENCOUNTER — Telehealth: Payer: Self-pay | Admitting: *Deleted

## 2013-02-01 NOTE — Telephone Encounter (Signed)
Had an ultrasound yesterday morning and was told we would call her back with an additional appointment and she hasn't heard from [place] yet.

## 2013-02-02 NOTE — Telephone Encounter (Signed)
Scheduled an appointment with Dr. Nelwyn Salisbury office for her GYN oncology referral.  Her appointment is scheduled for Tuesday 02/06/13 at 10:30am.  Patient was informed and voiced understanding.  A nurse with GYN oncology will call her on Monday to confirm her appointment.

## 2013-02-05 ENCOUNTER — Telehealth: Payer: Self-pay | Admitting: Gynecologic Oncology

## 2013-02-05 NOTE — Telephone Encounter (Signed)
Consult Note: Gyn-Onc  Consult was requested by Dr. for the evaluation of Kathleen Kane 52 y.o. female  CC: Pelvic mass   Assessment/Plan:  [name]  is a 52 y.o.  year old with a right complex adnexal mass and minimally elevated CA 125.   HPI:   Findings:  Uterus: Measures 12.2 x 9.3 x 9.6 cm and contains multiple fibroids. In the right aspect of the uterine body is a 3.6 x 3.4 x 3.3 cm intramural fibroid. In the posterior uterine body is a 6.2 x 6.1 x 5.8 cm intramural fibroid. In the left aspect of the uterus a 2.9 x 2.8 x 2.9 cm intramural fibroid.  Endometrium: Partially obscured by the fibroids. Where visualized, the endometrium measures 3.7 mm. Right ovary: In the right adnexa, and extending into the right upper quadrant of the abdomen, is a 17.5 x 11.4 x 18.2 cm markedly complex cystic mass with numerous internal septations and nodular soft tissue components. Septations measure up to 6 mm in thickness. Color Doppler flow is seen within some of the  septations and nodular components. No normal appearing right ovarian tissue is seen. Left ovary: Normal appearance/no adnexal mass. Measures 3.3 x 1.9 x 1.6 cm. Other findings: A small to moderate amount of simple-appearing fluid is seen.    Lab Results  Component Value Date   CA125 55.3* 01/29/2013     Current Meds:  Outpatient Encounter Prescriptions as of 02/05/2013  Medication Sig Dispense Refill  . aspirin 325 MG tablet Take 325 mg by mouth daily.      . furosemide (LASIX) 20 MG tablet Take 20 mg by mouth daily.      Marland Kitchen HYDROcodone-acetaminophen (NORCO/VICODIN) 5-325 MG per tablet Take 2 tablets by mouth every 4 (four) hours as needed for pain.  10 tablet  0  . ibuprofen (ADVIL,MOTRIN) 200 MG tablet Take 800 mg by mouth every 8 (eight) hours as needed for pain.      Marland Kitchen lisinopril (PRINIVIL,ZESTRIL) 20 MG tablet Take 1 tablet (20 mg total) by mouth daily.  30 tablet  0  . metFORMIN (GLUCOPHAGE) 500 MG tablet Take 500  mg by mouth at bedtime.      . promethazine (PHENERGAN) 25 MG tablet Take 1 tablet (25 mg total) by mouth every 6 (six) hours as needed for nausea.  20 tablet  0   No facility-administered encounter medications on file as of 02/05/2013.    Allergy: No Known Allergies  Social Hx:   History   Social History  . Marital Status: Legally Separated    Spouse Name: N/A    Number of Children: N/A  . Years of Education: N/A   Occupational History  . Not on file.   Social History Main Topics  . Smoking status: Current Every Day Smoker -- 1.00 packs/day  . Smokeless tobacco: Not on file  . Alcohol Use: No  . Drug Use: No  . Sexually Active: Not on file   Other Topics Concern  . Not on file   Social History Narrative  . No narrative on file    Past Surgical Hx:  Past Surgical History  Procedure Laterality Date  . Cesarean section    . Left breast cyst      Past Medical Hx:  Past Medical History  Diagnosis Date  . Atrial fibrillation   . CHF (congestive heart failure)   . Atrial fibrillation   . Diabetes mellitus without complication   . Hypertension  Past Gynecological History:     Family Hx: No family history on file.  Review of Systems:  Constitutional  Feels well,   Skin/Breast  No rash, sores, jaundice, itching, dryness Cardiovascular  No chest pain, shortness of breath, or edema  Pulmonary  No cough or wheeze.  Gastro Intestinal  No nausea, vomitting, or diarrhoea. No bright red blood per rectum, no abdominal pain, change in bowel movement, or constipation.  Genito Urinary  No frequency, urgency, dysuria,  Musculo Skeletal  No myalgia, arthralgia, joint swelling or pain  Neurologic  No weakness, numbness, change in gait,  Psychology  No depression, anxiety, insomnia.   Vitals:  @v @  Physical Exam: WD in NAD Neck  Supple NROM, without any enlargements.  Lymph Node Survey No cervical supraclavicular or inguinal adenopathy Cardiovascular   Pulse normal rate, regularity and rhythm. S1 and S2 normal.  Lungs  Clear to auscultation bilateraly, without wheezes/crackles/rhonchi. Good air movement.  Skin  No rash/lesions/breakdown  Psychiatry  Alert and oriented to person, place, and time  Abdomen  Normoactive bowel sounds, abdomen soft, non-tender and obese. Surgical  sites intact without evidence of hernia.  Back No CVA tenderness Genito Urinary  Vulva/vagina: Normal external female genitalia.  No lesions. No discharge or bleeding.  Bladder/urethra:  No lesions or masses  Vagina:   Cervix: Normal appearing, no lesions.  Uterus: Small, mobile, no parametrial involvement or nodularity.  Adnexa: No palpable masses. Rectal  Good tone, no masses no cul de sac nodularity.  Extremities  No bilateral cyanosis, clubbing or edema.     Laurette Schimke, MD, PhD 02/05/2013, 8:34 PM

## 2013-02-06 ENCOUNTER — Ambulatory Visit: Payer: Medicaid Other | Attending: Gynecologic Oncology | Admitting: Gynecologic Oncology

## 2013-02-06 ENCOUNTER — Encounter: Payer: Self-pay | Admitting: Gynecologic Oncology

## 2013-02-06 VITALS — BP 140/88 | HR 84 | Temp 98.8°F | Resp 18 | Ht 62.99 in | Wt 212.8 lb

## 2013-02-06 DIAGNOSIS — N9489 Other specified conditions associated with female genital organs and menstrual cycle: Secondary | ICD-10-CM | POA: Insufficient documentation

## 2013-02-06 DIAGNOSIS — R971 Elevated cancer antigen 125 [CA 125]: Secondary | ICD-10-CM | POA: Insufficient documentation

## 2013-02-06 DIAGNOSIS — R1900 Intra-abdominal and pelvic swelling, mass and lump, unspecified site: Secondary | ICD-10-CM

## 2013-02-06 DIAGNOSIS — N83209 Unspecified ovarian cyst, unspecified side: Secondary | ICD-10-CM | POA: Insufficient documentation

## 2013-02-06 DIAGNOSIS — R1909 Other intra-abdominal and pelvic swelling, mass and lump: Secondary | ICD-10-CM | POA: Insufficient documentation

## 2013-02-06 DIAGNOSIS — D259 Leiomyoma of uterus, unspecified: Secondary | ICD-10-CM | POA: Insufficient documentation

## 2013-02-06 NOTE — Progress Notes (Signed)
Consult Note: Gyn-Onc  Consult was requested by Dr. Debroah Loop for the evaluation of Kathleen Kane 52 y.o. female  CC: Pelvic mass   Assessment/Plan:  Kathleen Kane  is a 52 y.o.  year old with a right complex adnexal mass and minimally elevated CA 125. (55.)  The patient at this time denies any increase in abdominal girth weight loss changes in bowel or bladder habits or early satiety.  I have reviewed my recommendation for the planned surgery of Exploratory laparotomy RSO.  If intraoperative diagnosis of a malignancy is made the procedure would be inclusive of total abdominal hysterectomy, bilateral salpingo-oopherectomy, omentectomy, lymph node dissection, tumor debulking, possible appendectomy, possible bowel resection, possible colostomy. Risks of infection, bleeding, damage to nearby organs including bowel, bladder, vessels, and ureters reviewed. We discussed postoperative risks including need for transfusion, infection, PE/ DVT, cardiac events, bowel obstruction and lymphedema.  Patient has an underlying diagnosis of atrial fibrillation congestive heart failure and diabetes mellitus. We will obtain preoperative clearance as she is minimally compliant with her medications. The patient will be contacted regarding the date for surgical exploration.   HPI: This is a 52 year old gravida 4 para 2 last which appeared in January of 2014. In February 2040 she noted abdominal pain primarily in the lower abdomen with radiation to the umbilicus. The pain spontaneously resolved it was not associated with any nausea or vomiting.  The pain then recurred in March of 2014 at which time she presented to emergency room. A CT of the abdomen and pelvis was performed A CT of the abdomen and pelvis showed 16.8 cm complex right pelvic mass. 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 A subpleural nodular density measuring 8 mm is appreciated in the  right lower lobe. An ultrasound was collected on 02/01/2012  and the findings:  Uterus: Measures 12.2 x 9.3 x 9.6 cm and contains multiple fibroids. In the right aspect of the uterine body is a 3.6 x 3.4 x 3.3 cm intramural fibroid. In the posterior uterine body is a 6.2 x 6.1 x 5.8 cm intramural fibroid. In the left aspect of the uterus a 2.9 x 2.8 x 2.9 cm intramural fibroid.  Endometrium: Partially obscured by the fibroids. Where visualized, the endometrium measures 3.7 mm. Right ovary: In the right adnexa, and extending into the right upper quadrant of the abdomen, is a 17.5 x 11.4 x 18.2 cm markedly complex cystic mass with numerous internal septations and nodular soft tissue components. Septations measure up to 6 mm in thickness. Color Doppler flow is seen within some of the  septations and nodular components. No normal appearing right ovarian tissue is seen. Left ovary: Normal appearance/no adnexal mass. Measures 3.3 x 1.9 x 1.6 cm. Other findings: A small to moderate amount of simple-appearing fluid is seen.     Lab Results  Component Value Date   CA125 55.3* 01/29/2013   Patient denies any bloating but does report increasing abdominal girth and weight gain. She denies any nausea or vomiting the been no changes in her bowel or rectal habits she denies abnormal uterine bleeding or rectal bleeding.  Current Meds:  Outpatient Encounter Prescriptions as of 02/06/2013  Medication Sig Dispense Refill  . aspirin 325 MG tablet Take 325 mg by mouth daily.      . furosemide (LASIX) 20 MG tablet Take 20 mg by mouth daily.      Marland Kitchen ibuprofen (ADVIL,MOTRIN) 200 MG tablet Take 800 mg by  mouth every 8 (eight) hours as needed for pain.      Marland Kitchen lisinopril (PRINIVIL,ZESTRIL) 20 MG tablet Take 1 tablet (20 mg total) by mouth daily.  30 tablet  0  . metFORMIN (GLUCOPHAGE) 500 MG tablet Take 500 mg by mouth at bedtime.      Marland Kitchen HYDROcodone-acetaminophen (NORCO/VICODIN) 5-325 MG per tablet Take 2 tablets by mouth  every 4 (four) hours as needed for pain.  10 tablet  0  . promethazine (PHENERGAN) 25 MG tablet Take 1 tablet (25 mg total) by mouth every 6 (six) hours as needed for nausea.  20 tablet  0   No facility-administered encounter medications on file as of 02/06/2013.    Allergy: No Known Allergies  Social Hx:   History   Social History  . Marital Status: Legally Separated    Spouse Name: N/A    Number of Children: N/A  . Years of Education: N/A   Occupational History  . Not on file.   Social History Main Topics  . Smoking status: Current Every Day Smoker -- 0.75 packs/day for 24 years    Types: Cigarettes  . Smokeless tobacco: Not on file     Comment: smoking cessation info given.   . Alcohol Use: No  . Drug Use: No  . Sexually Active: Yes   Other Topics Concern  . Not on file   Social History Narrative  . No narrative on file    Past Surgical Hx:  Past Surgical History  Procedure Laterality Date  . Cesarean section    . Left breast cyst      Past Medical Hx:  Past Medical History  Diagnosis Date  . Atrial fibrillation   . CHF (congestive heart failure)   . Atrial fibrillation   . Diabetes mellitus without complication   . Hypertension     Past Gynecological History:   G4P2 Menarche 78. LMP 11/2012.  H/O OCP use.  Last normal pap 01/2013 wnl Lost mammogram 5-6 years ago  Family Hx: No family history on file.  Review of Systems:  Constitutional  Feels well,   Cardiovascular  No chest pain, shortness of breath, or edema  Pulmonary  No cough or wheeze.  Gastro Intestinal  No nausea, vomitting, or diarrhoea. No bright red blood per rectum, no abdominal pain, change in bowel movement, or constipation.  Genito Urinary  No frequency, urgency, dysuria, no vaginal bleeding, no discharge, no incontinence.  Musculo Skeletal  Bilateral hands hurt every day. Neurologic  No weakness, numbness, change in gait,  Psychology  No depression, anxiety, insomnia.    Vitals: BP 140/88  Pulse 84  Temp(Src) 98.8 F (37.1 C)  Resp 18  Ht 5' 2.99" (1.6 m)  Wt 212 lb 13 oz (96.53 kg)  BMI 37.71 kg/m2  LMP 11/27/2012  Physical Exam: WD in NAD Neck  Supple NROM, without any enlargements.  Lymph Node Survey No cervical supraclavicular or inguinal adenopathy Cardiovascular  Pulse normal rate, regularity and rhythm. S1 and S2 normal.  Lungs  Clear to auscultation bilateraly, without wheezes/crackles/rhonchi. Good air movement.  Skin  No rash/lesions/breakdown  Psychiatry  Alert and oriented to person, place, and time  Abdomen  Normoactive bowel sounds, abdomen soft, non-tender and obese. Surgical  sites intact without evidence of hernia.  Back No CVA tenderness Genito Urinary  Vulva/vagina: Normal external female genitalia.  No lesions. No discharge or bleeding.  Bladder/urethra:  No lesions or masses  Vagina: Rugae present no lesions  Cervix: Normal appearing,  approximately 3, no lesions.  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  Adnexa: Unable to assess a mass because of her body habitus  Rectal  Good tone, no masses no cul de sac nodularity.  Extremities  No bilateral cyanosis, clubbing or edema.     Laurette Schimke, MD, PhD 02/06/2013, 10:44 AM

## 2013-02-06 NOTE — Patient Instructions (Addendum)
We will schedule you for medical clearance given the underlying heart issues and long-standing diabetes.. We will contact you regarding the date of surgery  Thank you very much [name] for allowing me to provide care for you today.  I appreciate your confidence in choosing our Gynecologic Oncology team.  If you have any questions about your visit today please call our office and we will get back to you as soon as possible.  Maryclare Labrador. Kamyia Thomason MD., PhD Gynecologic Oncology

## 2013-02-09 ENCOUNTER — Other Ambulatory Visit (HOSPITAL_COMMUNITY): Payer: Self-pay | Admitting: Physician Assistant

## 2013-02-09 DIAGNOSIS — R079 Chest pain, unspecified: Secondary | ICD-10-CM

## 2013-02-14 ENCOUNTER — Encounter (HOSPITAL_COMMUNITY): Payer: Self-pay | Admitting: Pharmacy Technician

## 2013-02-16 ENCOUNTER — Ambulatory Visit (HOSPITAL_COMMUNITY)
Admission: RE | Admit: 2013-02-16 | Discharge: 2013-02-16 | Disposition: A | Discharge status: Home / Self Care | Payer: Medicaid Other | Source: Ambulatory Visit | Attending: Physician Assistant | Admitting: Physician Assistant

## 2013-02-16 DIAGNOSIS — I509 Heart failure, unspecified: Secondary | ICD-10-CM | POA: Insufficient documentation

## 2013-02-16 DIAGNOSIS — I10 Essential (primary) hypertension: Secondary | ICD-10-CM | POA: Insufficient documentation

## 2013-02-16 DIAGNOSIS — E119 Type 2 diabetes mellitus without complications: Secondary | ICD-10-CM | POA: Insufficient documentation

## 2013-02-16 DIAGNOSIS — I4891 Unspecified atrial fibrillation: Secondary | ICD-10-CM | POA: Insufficient documentation

## 2013-02-16 DIAGNOSIS — R079 Chest pain, unspecified: Secondary | ICD-10-CM | POA: Insufficient documentation

## 2013-02-16 DIAGNOSIS — G4733 Obstructive sleep apnea (adult) (pediatric): Secondary | ICD-10-CM | POA: Insufficient documentation

## 2013-02-16 DIAGNOSIS — E669 Obesity, unspecified: Secondary | ICD-10-CM | POA: Insufficient documentation

## 2013-02-16 DIAGNOSIS — R0989 Other specified symptoms and signs involving the circulatory and respiratory systems: Secondary | ICD-10-CM | POA: Insufficient documentation

## 2013-02-16 DIAGNOSIS — F17200 Nicotine dependence, unspecified, uncomplicated: Secondary | ICD-10-CM | POA: Insufficient documentation

## 2013-02-16 DIAGNOSIS — Z8249 Family history of ischemic heart disease and other diseases of the circulatory system: Secondary | ICD-10-CM | POA: Insufficient documentation

## 2013-02-16 DIAGNOSIS — R0609 Other forms of dyspnea: Secondary | ICD-10-CM | POA: Insufficient documentation

## 2013-02-16 HISTORY — PX: CARDIOVASCULAR STRESS TEST: SHX262

## 2013-02-16 MED ORDER — TECHNETIUM TC 99M SESTAMIBI GENERIC - CARDIOLITE
29.8000 | Freq: Once | INTRAVENOUS | Status: AC | PRN
  Administered 2013-02-16: 29.8 via INTRAVENOUS

## 2013-02-16 MED ORDER — AMINOPHYLLINE 25 MG/ML IV SOLN
75.0000 mg | Freq: Once | INTRAVENOUS | Status: AC
  Administered 2013-02-16: 75 mg via INTRAVENOUS

## 2013-02-16 MED ORDER — REGADENOSON 0.4 MG/5ML IV SOLN
0.4000 mg | Freq: Once | INTRAVENOUS | Status: AC
  Administered 2013-02-16: 0.4 mg via INTRAVENOUS

## 2013-02-16 MED ORDER — TECHNETIUM TC 99M SESTAMIBI GENERIC - CARDIOLITE
10.7000 | Freq: Once | INTRAVENOUS | Status: AC | PRN
  Administered 2013-02-16: 11 via INTRAVENOUS

## 2013-02-16 NOTE — Procedures (Addendum)
[facility] [address] [place] [address] [place] [address] [phone]  Cardiology Nuclear Med Study  Kathleen Kane is a 52 y.o. female     MRN : [id number]     DOB: Jul 19, 1961  Procedure Date: 02/16/2013  Nuclear Med Background Indication for Stress Test:  Surgical Clearance History:  CHF;A-FIB;OSA Cardiac Risk Factors: Family History - CAD, Hypertension, NIDDM, Obesity and Smoker  Symptoms:  Chest Pain and DOE   Nuclear Pre-Procedure Caffeine/Decaff Intake:  1:00am NPO After: 11 AM   IV Site: R Hand  IV 0.9% NS with Angio Cath:  22g  Chest Size (in):  N/A IV Started by: Emmit Pomfret, RN  Height: 5\' 2"  (1.575 m)  Cup Size: DD  BMI:  Body mass index is 39.5 kg/(m^2). Weight:  216 lb (97.977 kg)   Tech Comments:  N/A    Nuclear Med Study 1 or 2 day study: 1 day  Stress Test Type:  Lexiscan  Order Authorizing Provider:  Nanetta Batty, MD   Resting Radionuclide: Technetium 35m Sestamibi  Resting Radionuclide Dose: 10.7 mCi   Stress Radionuclide:  Technetium 83m Sestamibi  Stress Radionuclide Dose: 29.8 mCi           Stress Protocol Rest HR: 80 Stress HR: 99  Rest BP: 140/73 Stress BP:162/71  Exercise Time (min): n/a METS: n/a          Dose of Adenosine (mg):  n/a Dose of Lexiscan: 0.4 mg  Dose of Atropine (mg): n/a Dose of Dobutamine: n/a mcg/kg/min (at max HR)  Stress Test Technologist: Ernestene Mention, CCT Nuclear Technologist: Gonzella Lex, CNMT   Rest Procedure:  Myocardial perfusion imaging was performed at rest 45 minutes following the intravenous administration of Technetium 39m Sestamibi. Stress Procedure:  The patient received IV Lexiscan 0.4 mg over 15-seconds.  Technetium 88m Sestamibi injected at 30-seconds.  There were no significant changes with Lexiscan.  Quantitative spect images were obtained after a 45 minute delay.  Transient Ischemic Dilatation (Normal <1.22): 1.21 -- Does not appear to be TID  visually. Lung/Heart Ratio (Normal <0.45):  0.25 QGS EDV:  98 ml QGS ESV:  38 ml LV Ejection Fraction: 61%  Signed by Gonzella Lex, CNMT  PHYSICIAN INTERPRETATION  Rest ECG: NSR - Normal EKG  Stress ECG: No significant change from baseline ECG  QPS Raw Data Images:  There is interference from nuclear activity from structures below the diaphragm -- very high tracer uptake in the Left Hepatic lobe, more noted on stress than rest images. This does not affect the ability to read the study.  Mild diaphragmatic attenuation.  Normal left ventricular size.  The LV is not dilated, nor does there appear to be an appreciable size difference between rest & stress images.  The LV walls appear to be hypertrophic/ Stress Images:  There is mild apical thinning with normal uptake in other regions. The mid to apical inferoseptal wall is mildly obscured by the Left Hepatic lobe. Rest Images:  Comparison with the stress images reveals no significant change.  Subtraction (SDS):  Apical thinning with no additional perfusion defects noted. There is no evidence of scar or ischemia. Normal  Impression Exercise Capacity:  Lexiscan with no exercise. BP Response:  Normal blood pressure response. Clinical Symptoms:  No significant symptoms noted. ECG Impression:  No significant ECG changes with Lexiscan. LV Wall Motion:  NL LV Function; NL Wall Motion Comparison with Prior Nuclear Study: No significant change from previous study  Overall Impression:  Normal stress nuclear study.  Low risk stress nuclear study.   Marykay Lex, MD  02/16/2013 5:46 PM

## 2013-02-22 ENCOUNTER — Encounter (HOSPITAL_COMMUNITY): Payer: Self-pay

## 2013-02-22 ENCOUNTER — Encounter (HOSPITAL_COMMUNITY)
Admission: RE | Admit: 2013-02-22 | Discharge: 2013-02-22 | Disposition: A | Discharge status: Home / Self Care | Payer: Medicaid Other | Source: Ambulatory Visit | Attending: Gynecologic Oncology | Admitting: Gynecologic Oncology

## 2013-02-22 ENCOUNTER — Ambulatory Visit (HOSPITAL_COMMUNITY)
Admission: RE | Admit: 2013-02-22 | Discharge: 2013-02-22 | Disposition: A | Discharge status: Home / Self Care | Payer: Medicaid Other | Source: Ambulatory Visit | Attending: Gynecologic Oncology | Admitting: Gynecologic Oncology

## 2013-02-22 DIAGNOSIS — F17200 Nicotine dependence, unspecified, uncomplicated: Secondary | ICD-10-CM | POA: Insufficient documentation

## 2013-02-22 DIAGNOSIS — R1909 Other intra-abdominal and pelvic swelling, mass and lump: Secondary | ICD-10-CM | POA: Insufficient documentation

## 2013-02-22 DIAGNOSIS — Z01818 Encounter for other preprocedural examination: Secondary | ICD-10-CM | POA: Insufficient documentation

## 2013-02-22 DIAGNOSIS — I10 Essential (primary) hypertension: Secondary | ICD-10-CM | POA: Insufficient documentation

## 2013-02-22 HISTORY — DX: Depression, unspecified: F32.A

## 2013-02-22 HISTORY — DX: Major depressive disorder, single episode, unspecified: F32.9

## 2013-02-22 HISTORY — DX: Anxiety disorder, unspecified: F41.9

## 2013-02-22 LAB — COMPREHENSIVE METABOLIC PANEL
Alkaline Phosphatase: 95 U/L (ref 39–117)
BUN: 15 mg/dL (ref 6–23)
Chloride: 100 mEq/L (ref 96–112)
Creatinine, Ser: 0.59 mg/dL (ref 0.50–1.10)
GFR calc Af Amer: >90 mL/min (ref >90)
GFR calc non Af Amer: >90 mL/min (ref >90)
Glucose, Bld: 128 mg/dL — ABNORMAL HIGH (ref 70–99)
Potassium: 3.9 mEq/L (ref 3.5–5.1)
Total Bilirubin: 0.2 mg/dL — ABNORMAL LOW (ref 0.3–1.2)

## 2013-02-22 LAB — CBC WITH DIFFERENTIAL/PLATELET
HCT: 36.4 % (ref 36.0–46.0)
Hemoglobin: 12 g/dL (ref 12.0–15.0)
Lymphs Abs: 2 10*3/uL (ref 0.7–4.0)
MCH: 27.6 pg (ref 26.0–34.0)
MCHC: 33 g/dL (ref 30.0–36.0)
Monocytes Absolute: 0.4 10*3/uL (ref 0.1–1.0)
Monocytes Relative: 6 % (ref 3–12)
Neutro Abs: 4 10*3/uL (ref 1.7–7.7)
Neutrophils Relative %: 60 % (ref 43–77)
RBC: 4.35 MIL/uL (ref 3.87–5.11)

## 2013-02-22 LAB — HCG, SERUM, QUALITATIVE: Preg, Serum: NEGATIVE

## 2013-02-22 LAB — CA 125: CA 125: 28.4 U/mL (ref 0.0–30.2)

## 2013-02-22 NOTE — Progress Notes (Addendum)
LOV 02/09/13 on chart, EKG 02/09/13 on chart

## 2013-02-22 NOTE — Patient Instructions (Signed)
20 MEAH JIRON  02/22/2013   Your procedure is scheduled on: 02/27/13  Report to [facility] at [phone].  Call this number if you have problems the morning of surgery 336-: [phone]   Remember:   Clear liquids for 24 hours.      Do not wear jewelry, make-up or nail polish.  Do not wear lotions, powders, or perfumes. You may wear deodorant.  Do not shave 48 hours prior to surgery. Men may shave face and neck.  Do not bring valuables to the hospital.  Contacts, dentures or bridgework may not be worn into surgery.  Leave suitcase in the car. After surgery it may be brought to your room.  For patients admitted to the hospital, checkout time is 11:00 AM the day of discharge.    Please read over the following fact sheets that you were given: MRSA Information, incentive spirometry fact sheet  Birdie Sons, RN  pre op nurse call if needed [phone]    FAILURE TO FOLLOW THESE INSTRUCTIONS MAY RESULT IN CANCELLATION OF YOUR SURGERY   Patient Signature: ___________________________________________

## 2013-02-24 LAB — MRSA CULTURE

## 2013-02-26 ENCOUNTER — Encounter: Payer: Self-pay | Admitting: Gynecologic Oncology

## 2013-02-27 ENCOUNTER — Inpatient Hospital Stay (HOSPITAL_COMMUNITY)
Admission: RE | Admit: 2013-02-27 | Discharge: 2013-03-01 | DRG: 743 | Disposition: A | Discharge status: Home / Self Care | Payer: Medicaid Other | Source: Ambulatory Visit | Attending: Obstetrics & Gynecology | Admitting: Obstetrics & Gynecology

## 2013-02-27 ENCOUNTER — Encounter (HOSPITAL_COMMUNITY): Payer: Self-pay | Admitting: *Deleted

## 2013-02-27 ENCOUNTER — Encounter (HOSPITAL_COMMUNITY): Payer: Self-pay | Admitting: Anesthesiology

## 2013-02-27 ENCOUNTER — Ambulatory Visit (HOSPITAL_COMMUNITY): Payer: Medicaid Other | Admitting: Anesthesiology

## 2013-02-27 ENCOUNTER — Encounter (HOSPITAL_COMMUNITY)
Admission: RE | Disposition: A | Discharge status: Home / Self Care | Payer: Self-pay | Source: Ambulatory Visit | Attending: Obstetrics & Gynecology

## 2013-02-27 DIAGNOSIS — D279 Benign neoplasm of unspecified ovary: Principal | ICD-10-CM | POA: Diagnosis present

## 2013-02-27 DIAGNOSIS — I4891 Unspecified atrial fibrillation: Secondary | ICD-10-CM | POA: Diagnosis not present

## 2013-02-27 DIAGNOSIS — E119 Type 2 diabetes mellitus without complications: Secondary | ICD-10-CM | POA: Diagnosis present

## 2013-02-27 DIAGNOSIS — F17200 Nicotine dependence, unspecified, uncomplicated: Secondary | ICD-10-CM | POA: Diagnosis present

## 2013-02-27 DIAGNOSIS — E669 Obesity, unspecified: Secondary | ICD-10-CM | POA: Diagnosis present

## 2013-02-27 DIAGNOSIS — I509 Heart failure, unspecified: Secondary | ICD-10-CM | POA: Diagnosis present

## 2013-02-27 DIAGNOSIS — F329 Major depressive disorder, single episode, unspecified: Secondary | ICD-10-CM | POA: Diagnosis present

## 2013-02-27 DIAGNOSIS — R1900 Intra-abdominal and pelvic swelling, mass and lump, unspecified site: Secondary | ICD-10-CM | POA: Diagnosis present

## 2013-02-27 DIAGNOSIS — F3289 Other specified depressive episodes: Secondary | ICD-10-CM | POA: Diagnosis present

## 2013-02-27 DIAGNOSIS — R0602 Shortness of breath: Secondary | ICD-10-CM | POA: Diagnosis not present

## 2013-02-27 DIAGNOSIS — F411 Generalized anxiety disorder: Secondary | ICD-10-CM | POA: Diagnosis present

## 2013-02-27 HISTORY — PX: LAPAROTOMY: SHX154

## 2013-02-27 HISTORY — PX: SALPINGOOPHORECTOMY: SHX82

## 2013-02-27 LAB — GLUCOSE, CAPILLARY
Glucose-Capillary: 111 mg/dL — ABNORMAL HIGH (ref 70–99)
Glucose-Capillary: 127 mg/dL — ABNORMAL HIGH (ref 70–99)

## 2013-02-27 LAB — TYPE AND SCREEN
ABO/RH(D): A POS
Antibody Screen: NEGATIVE

## 2013-02-27 SURGERY — LAPAROTOMY, EXPLORATORY
Anesthesia: General | Site: Pelvis | Laterality: Right | Wound class: Clean

## 2013-02-27 MED ORDER — ACETAMINOPHEN 10 MG/ML IV SOLN
INTRAVENOUS | Status: AC
  Filled 2013-02-27: qty 100

## 2013-02-27 MED ORDER — FUROSEMIDE 20 MG PO TABS
20.0000 mg | ORAL_TABLET | Freq: Every day | ORAL | Status: DC
  Administered 2013-02-27 – 2013-03-01 (×3): 20 mg via ORAL
  Filled 2013-02-27 (×3): qty 1

## 2013-02-27 MED ORDER — LIDOCAINE HCL (CARDIAC) 20 MG/ML IV SOLN
INTRAVENOUS | Status: DC | PRN
  Administered 2013-02-27: 100 mg via INTRAVENOUS

## 2013-02-27 MED ORDER — MIDAZOLAM HCL 5 MG/5ML IJ SOLN
INTRAMUSCULAR | Status: DC | PRN
  Administered 2013-02-27: 2 mg via INTRAVENOUS

## 2013-02-27 MED ORDER — BUPIVACAINE LIPOSOME 1.3 % IJ SUSP
20.0000 mL | Freq: Once | INTRAMUSCULAR | Status: DC
  Filled 2013-02-27: qty 20

## 2013-02-27 MED ORDER — ONDANSETRON HCL 4 MG/2ML IJ SOLN: 4.0000 mg | Freq: Four times a day (QID) | INTRAMUSCULAR | Status: DC | PRN

## 2013-02-27 MED ORDER — ACETAMINOPHEN 10 MG/ML IV SOLN
INTRAVENOUS | Status: DC | PRN
  Administered 2013-02-27: 1000 mg via INTRAVENOUS

## 2013-02-27 MED ORDER — LISINOPRIL 20 MG PO TABS
20.0000 mg | ORAL_TABLET | Freq: Every day | ORAL | Status: DC
  Filled 2013-02-27 (×3): qty 1

## 2013-02-27 MED ORDER — ROCURONIUM BROMIDE 100 MG/10ML IV SOLN
INTRAVENOUS | Status: DC | PRN
  Administered 2013-02-27: 40 mg via INTRAVENOUS

## 2013-02-27 MED ORDER — LACTATED RINGERS IV SOLN
INTRAVENOUS | Status: DC
  Administered 2013-02-27: 1000 mL via INTRAVENOUS

## 2013-02-27 MED ORDER — LABETALOL HCL 5 MG/ML IV SOLN
INTRAVENOUS | Status: DC | PRN
  Administered 2013-02-27 (×2): 2.5 mg via INTRAVENOUS

## 2013-02-27 MED ORDER — ENOXAPARIN SODIUM 40 MG/0.4ML ~~LOC~~ SOLN
40.0000 mg | SUBCUTANEOUS | Status: AC
  Administered 2013-02-27: 40 mg via SUBCUTANEOUS
  Filled 2013-02-27: qty 0.4

## 2013-02-27 MED ORDER — CEFAZOLIN SODIUM-DEXTROSE 2-3 GM-% IV SOLR
2.0000 g | INTRAVENOUS | Status: AC
  Administered 2013-02-27: 2 g via INTRAVENOUS

## 2013-02-27 MED ORDER — ASPIRIN 325 MG PO TABS
325.0000 mg | ORAL_TABLET | Freq: Every day | ORAL | Status: DC
  Administered 2013-02-27 – 2013-03-01 (×3): 325 mg via ORAL
  Filled 2013-02-27 (×3): qty 1

## 2013-02-27 MED ORDER — FUROSEMIDE 10 MG/ML IJ SOLN
INTRAMUSCULAR | Status: DC | PRN
  Administered 2013-02-27: 10 mg via INTRAMUSCULAR

## 2013-02-27 MED ORDER — HYDROMORPHONE HCL PF 1 MG/ML IJ SOLN
0.5000 mg | INTRAMUSCULAR | Status: DC | PRN
  Administered 2013-02-27: 0.5 mg via INTRAVENOUS
  Administered 2013-02-27: 18:00:00 via INTRAVENOUS
  Administered 2013-02-28 (×4): 0.5 mg via INTRAVENOUS
  Filled 2013-02-27 (×5): qty 1

## 2013-02-27 MED ORDER — ONDANSETRON HCL 4 MG/2ML IJ SOLN
INTRAMUSCULAR | Status: DC | PRN
  Administered 2013-02-27: 4 mg via INTRAVENOUS

## 2013-02-27 MED ORDER — ACETAMINOPHEN 10 MG/ML IV SOLN
1000.0000 mg | Freq: Four times a day (QID) | INTRAVENOUS | Status: AC
  Administered 2013-02-27 – 2013-02-28 (×2): 1000 mg via INTRAVENOUS
  Filled 2013-02-27 (×4): qty 100

## 2013-02-27 MED ORDER — GLYCOPYRROLATE 0.2 MG/ML IJ SOLN
INTRAMUSCULAR | Status: DC | PRN
  Administered 2013-02-27: 0.6 mg via INTRAVENOUS

## 2013-02-27 MED ORDER — SUCCINYLCHOLINE CHLORIDE 20 MG/ML IJ SOLN
INTRAMUSCULAR | Status: DC | PRN
  Administered 2013-02-27: 100 mg via INTRAVENOUS

## 2013-02-27 MED ORDER — PROPOFOL 10 MG/ML IV BOLUS
INTRAVENOUS | Status: DC | PRN
  Administered 2013-02-27: 150 mg via INTRAVENOUS

## 2013-02-27 MED ORDER — NEOSTIGMINE METHYLSULFATE 1 MG/ML IJ SOLN
INTRAMUSCULAR | Status: DC | PRN
  Administered 2013-02-27: 5 mg via INTRAVENOUS

## 2013-02-27 MED ORDER — OXYCODONE-ACETAMINOPHEN 5-325 MG PO TABS
1.0000 | ORAL_TABLET | ORAL | Status: DC | PRN
  Administered 2013-02-27 – 2013-03-01 (×4): 2 via ORAL
  Filled 2013-02-27 (×4): qty 2

## 2013-02-27 MED ORDER — HYDROMORPHONE HCL PF 1 MG/ML IJ SOLN
0.2500 mg | INTRAMUSCULAR | Status: DC | PRN
  Administered 2013-02-27 (×2): 0.5 mg via INTRAVENOUS

## 2013-02-27 MED ORDER — SODIUM CHLORIDE 0.9 % IJ SOLN
INTRAMUSCULAR | Status: DC | PRN
  Administered 2013-02-27: 16:00:00

## 2013-02-27 MED ORDER — MAGNESIUM HYDROXIDE 400 MG/5ML PO SUSP
30.0000 mL | Freq: Three times a day (TID) | ORAL | Status: AC
  Administered 2013-02-27 – 2013-02-28 (×3): 30 mL via ORAL
  Filled 2013-02-27 (×3): qty 30

## 2013-02-27 MED ORDER — CEFAZOLIN SODIUM-DEXTROSE 2-3 GM-% IV SOLR
INTRAVENOUS | Status: AC
  Filled 2013-02-27: qty 50

## 2013-02-27 MED ORDER — ONDANSETRON 4 MG PO TBDP
4.0000 mg | ORAL_TABLET | Freq: Three times a day (TID) | ORAL | Status: DC | PRN
  Filled 2013-02-27: qty 1

## 2013-02-27 MED ORDER — ZOLPIDEM TARTRATE 5 MG PO TABS: 5.0000 mg | ORAL_TABLET | Freq: Every evening | ORAL | Status: DC | PRN

## 2013-02-27 MED ORDER — HYDROMORPHONE HCL PF 1 MG/ML IJ SOLN
INTRAMUSCULAR | Status: AC
  Filled 2013-02-27: qty 1

## 2013-02-27 MED ORDER — PROMETHAZINE HCL 25 MG/ML IJ SOLN: 6.2500 mg | INTRAMUSCULAR | Status: DC | PRN

## 2013-02-27 MED ORDER — DEXAMETHASONE SODIUM PHOSPHATE 10 MG/ML IJ SOLN: INTRAMUSCULAR | Status: DC | PRN

## 2013-02-27 MED ORDER — KCL IN DEXTROSE-NACL 20-5-0.45 MEQ/L-%-% IV SOLN
INTRAVENOUS | Status: DC
  Administered 2013-02-27 – 2013-02-28 (×2): via INTRAVENOUS
  Filled 2013-02-27 (×3): qty 1000

## 2013-02-27 MED ORDER — FENTANYL CITRATE 0.05 MG/ML IJ SOLN
INTRAMUSCULAR | Status: DC | PRN
  Administered 2013-02-27 (×2): 50 ug via INTRAVENOUS
  Administered 2013-02-27: 100 ug via INTRAVENOUS
  Administered 2013-02-27: 50 ug via INTRAVENOUS

## 2013-02-27 SURGICAL SUPPLY — 46 items
APPLIER CLIP 11 MED OPEN (CLIP) ×3
APR CLP MED 11 20 MLT OPN (CLIP) ×2
ATTRACTOMAT 16X20 MAGNETIC DRP (DRAPES) ×3 IMPLANT
BAG URINE DRAINAGE (UROLOGICAL SUPPLIES) ×2 IMPLANT
CANISTER SUCTION 2500CC (MISCELLANEOUS) ×3 IMPLANT
CLIP APPLIE 11 MED OPEN (CLIP) IMPLANT
CLIP TI MEDIUM LARGE 6 (CLIP) ×12 IMPLANT
CLOTH BEACON ORANGE TIMEOUT ST (SAFETY) ×3 IMPLANT
CONT SPECI 4OZ STER CLIK (MISCELLANEOUS) ×3 IMPLANT
COVER SURGICAL LIGHT HANDLE (MISCELLANEOUS) ×3 IMPLANT
DRAPE UTILITY 15X26 (DRAPE) ×3 IMPLANT
DRAPE UTILITY XL STRL (DRAPES) ×1 IMPLANT
DRAPE WARM FLUID 44X44 (DRAPE) ×3 IMPLANT
DRESSING TELFA ISLAND 4X8 (GAUZE/BANDAGES/DRESSINGS) ×1 IMPLANT
ELECT REM PT RETURN 9FT ADLT (ELECTROSURGICAL) ×3
ELECTRODE REM PT RTRN 9FT ADLT (ELECTROSURGICAL) ×2 IMPLANT
GAUZE SPONGE 4X4 16PLY XRAY LF (GAUZE/BANDAGES/DRESSINGS) ×2 IMPLANT
GLOVE BIO SURGEON STRL SZ 6.5 (GLOVE) ×1 IMPLANT
GLOVE BIOGEL M STRL SZ7.5 (GLOVE) ×12 IMPLANT
GLOVE INDICATOR 8.0 STRL GRN (GLOVE) ×1 IMPLANT
GOWN STRL REIN XL XLG (GOWN DISPOSABLE) ×3 IMPLANT
KIT BASIN OR (CUSTOM PROCEDURE TRAY) ×3 IMPLANT
NEEDLE HYPO 22GX1.5 SAFETY (NEEDLE) ×1 IMPLANT
NS IRRIG 1000ML POUR BTL (IV SOLUTION) ×12 IMPLANT
PACK ABDOMINAL WL (CUSTOM PROCEDURE TRAY) ×3 IMPLANT
SHEET LAVH (DRAPES) ×3 IMPLANT
SPONGE LAP 18X18 X RAY DECT (DISPOSABLE) ×6 IMPLANT
SUT ETHILON 1 LR 30 (SUTURE) IMPLANT
SUT MNCRL AB 4-0 PS2 18 (SUTURE) ×2 IMPLANT
SUT PDS AB 0 CT1 36 (SUTURE) ×3 IMPLANT
SUT PDS AB 0 CTX 60 (SUTURE) ×6 IMPLANT
SUT SILK 2 0 (SUTURE)
SUT SILK 2 0 30  PSL (SUTURE)
SUT SILK 2 0 30 PSL (SUTURE) IMPLANT
SUT SILK 2-0 18XBRD TIE 12 (SUTURE) ×2 IMPLANT
SUT SILK 3 0 SH 30 (SUTURE) ×1 IMPLANT
SUT VIC AB 0 CT1 36 (SUTURE) ×4 IMPLANT
SUT VIC AB 2-0 SH 27 (SUTURE)
SUT VIC AB 2-0 SH 27X BRD (SUTURE) ×4 IMPLANT
SUT VIC AB 3-0 CTX 36 (SUTURE) IMPLANT
SUT VIC AB 4-0 PS1 27 (SUTURE) ×6 IMPLANT
SUT VICRYL 0 TIES 12 18 (SUTURE) ×3 IMPLANT
SYR 20CC LL (SYRINGE) ×1 IMPLANT
TOWEL OR NON WOVEN STRL DISP B (DISPOSABLE) ×4 IMPLANT
TRAY FOLEY CATH 14FRSI W/METER (CATHETERS) ×3 IMPLANT
WATER STERILE IRR 1500ML POUR (IV SOLUTION) ×2 IMPLANT

## 2013-02-27 NOTE — Progress Notes (Signed)
Patient declined lisinopril stated makes her cough and she does not want to cough tonight. Patient stated her physician recently changed her medication and she is not sure what it is and has not filled the prescription yet. Patient will find out what she is taking tomorrow. Wafa Martes RN

## 2013-02-27 NOTE — Op Note (Addendum)
Pre-operative Diagnosis: Right pelvic mass  Post-operative Diagnosis: Right mucinous cystadenofibroma  Surgeon:  Maryclare Labrador. Serjio Deupree MD., PhD  Assistant Surgeon: Antionette Char   Operation: Exploratory laparotomy right salpingo-oophorectomy  Procedure Details  The patient was seen in the Holding Room. The risks, benefits, complications, treatment options, and expected outcomes were discussed with the patient.  The patient concurred with the proposed plan, giving informed consent.   The patient was  identified as Kathleen Kane and the procedure verified as right salpingo-oophorectomy possible staging A Time Out was held and the above information confirmed.  After induction of anesthesia, the patient was draped and prepped in the usual sterile manner. The patient was placed in the semi-lithotomy position, in Maish Vaya stirrups after anesthesia and draped and prepped in the usual sterile manner. A foley catheter was placed.  A 14 cm vertical midline incision was made and carried through the subcutaneous tissue to the fascia.  The rectus muscles were separated. The peritoneum was identified and entered. Peritoneal incision was extended longitudinally.  The above findings were noted. Free fluid was identified and collected.  A right adnexal mass was noted that was torsed x2..  The mass was noted to have filmy adhesions to the anterior abdominal wall and the small bowel. These were transected. The mass untorsed with care taken to hold pressure on the proximal aspect. Heaney clamps were placed over the uterine cornua and the right infundibulopelvic ligament. The specimen is amputated and sent for frozen section. The utero-ovarian ligament and the fibula pelvic ligaments were secured with suture. The defect in the broad ligament was appreciated and hemostasis achieved with the use of clips.  The small bowel where the mass was adherent was inspected. A serosal abrasion was appreciated and oversewn with  3-0 silk suture interrupted.  The upper abdomen was inspected and no abnormalities were appreciated. The contralateral left adnexa was within normal limits. The abdomen was copiously irrigated and drained.  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 Hemostasis was observed. His throat was injected into the subcutaneous tissue The skin was approximated running subcuticular 4.0 Vicryl sutures.  Instrument, sponge, and needle counts were correct prior to abdominal closure and at the conclusion of the case.   Findings: 18 cm solid and cystic mass emanating from the right ovary. Adhesions to the abdominal wall and small bowel were filmy in nature. The contralateral ovary was unremarkable. Small uterine fibroids were appreciated   Estimated Blood Loss:  less than 50 mL         Drains: Foley draining clear urine         Total IV Fluids: 1000 ml         Specimens: Right ovary and fallopian tube                 Complications:  None; patient tolerated the procedure well.         Disposition: PACU - hemodynamically stable.         Condition: stable

## 2013-02-27 NOTE — Preoperative (Signed)
Beta Blockers   Reason not to administer Beta Blockers:Not Applicable 

## 2013-02-27 NOTE — H&P (View-Only) (Signed)
Consult Note: Gyn-Onc  Consult was requested by Dr. Arnold for the evaluation of Kathleen Kane 51 y.o. female  CC: Pelvic mass   Assessment/Plan:  Kathleen Kane  is a 51 y.o.  year old with a right complex adnexal mass and minimally elevated CA 125. (55.)  The patient at this time denies any increase in abdominal girth weight loss changes in bowel or bladder habits or early satiety.  I have reviewed my recommendation for the planned surgery of Exploratory laparotomy RSO.  If intraoperative diagnosis of a malignancy is made the procedure would be inclusive of total abdominal hysterectomy, bilateral salpingo-oopherectomy, omentectomy, lymph node dissection, tumor debulking, possible appendectomy, possible bowel resection, possible colostomy. Risks of infection, bleeding, damage to nearby organs including bowel, bladder, vessels, and ureters reviewed. We discussed postoperative risks including need for transfusion, infection, PE/ DVT, cardiac events, bowel obstruction and lymphedema.  Patient has an underlying diagnosis of atrial fibrillation congestive heart failure and diabetes mellitus. We will obtain preoperative clearance as she is minimally compliant with her medications. The patient will be contacted regarding the date for surgical exploration.   HPI: This is a 51-year-old gravida 4 para 2 last which appeared in January of 2014. In February 2040 she noted abdominal pain primarily in the lower abdomen with radiation to the umbilicus. The pain spontaneously resolved it was not associated with any nausea or vomiting.  The pain then recurred in March of 2014 at which time she presented to emergency room. A CT of the abdomen and pelvis was performed A CT of the abdomen and pelvis showed 16.8 cm complex right pelvic mass. 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 A subpleural nodular density measuring 8 mm is appreciated in the  right lower lobe. An ultrasound was collected on 02/01/2012  and the findings:  Uterus: Measures 12.2 x 9.3 x 9.6 cm and contains multiple fibroids. In the right aspect of the uterine body is a 3.6 x 3.4 x 3.3 cm intramural fibroid. In the posterior uterine body is a 6.2 x 6.1 x 5.8 cm intramural fibroid. In the left aspect of the uterus a 2.9 x 2.8 x 2.9 cm intramural fibroid.  Endometrium: Partially obscured by the fibroids. Where visualized, the endometrium measures 3.7 mm. Right ovary: In the right adnexa, and extending into the right upper quadrant of the abdomen, is a 17.5 x 11.4 x 18.2 cm markedly complex cystic mass with numerous internal septations and nodular soft tissue components. Septations measure up to 6 mm in thickness. Color Doppler flow is seen within some of the  septations and nodular components. No normal appearing right ovarian tissue is seen. Left ovary: Normal appearance/no adnexal mass. Measures 3.3 x 1.9 x 1.6 cm. Other findings: A small to moderate amount of simple-appearing fluid is seen.     Lab Results  Component Value Date   CA125 55.3* 01/29/2013   Patient denies any bloating but does report increasing abdominal girth and weight gain. She denies any nausea or vomiting the been no changes in her bowel or rectal habits she denies abnormal uterine bleeding or rectal bleeding.  Current Meds:  Outpatient Encounter Prescriptions as of 02/06/2013  Medication Sig Dispense Refill  . aspirin 325 MG tablet Take 325 mg by mouth daily.      . furosemide (LASIX) 20 MG tablet Take 20 mg by mouth daily.      . ibuprofen (ADVIL,MOTRIN) 200 MG tablet Take 800 mg by   mouth every 8 (eight) hours as needed for pain.      . lisinopril (PRINIVIL,ZESTRIL) 20 MG tablet Take 1 tablet (20 mg total) by mouth daily.  30 tablet  0  . metFORMIN (GLUCOPHAGE) 500 MG tablet Take 500 mg by mouth at bedtime.      . HYDROcodone-acetaminophen (NORCO/VICODIN) 5-325 MG per tablet Take 2 tablets by mouth  every 4 (four) hours as needed for pain.  10 tablet  0  . promethazine (PHENERGAN) 25 MG tablet Take 1 tablet (25 mg total) by mouth every 6 (six) hours as needed for nausea.  20 tablet  0   No facility-administered encounter medications on file as of 02/06/2013.    Allergy: No Known Allergies  Social Hx:   History   Social History  . Marital Status: Legally Separated    Spouse Name: N/A    Number of Children: N/A  . Years of Education: N/A   Occupational History  . Not on file.   Social History Main Topics  . Smoking status: Current Every Day Smoker -- 0.75 packs/day for 24 years    Types: Cigarettes  . Smokeless tobacco: Not on file     Comment: smoking cessation info given.   . Alcohol Use: No  . Drug Use: No  . Sexually Active: Yes   Other Topics Concern  . Not on file   Social History Narrative  . No narrative on file    Past Surgical Hx:  Past Surgical History  Procedure Laterality Date  . Cesarean section    . Left breast cyst      Past Medical Hx:  Past Medical History  Diagnosis Date  . Atrial fibrillation   . CHF (congestive heart failure)   . Atrial fibrillation   . Diabetes mellitus without complication   . Hypertension     Past Gynecological History:   G4P2 Menarche 10. LMP 11/2012.  H/O OCP use.  Last normal pap 01/2013 wnl Lost mammogram 5-6 years ago  Family Hx: No family history on file.  Review of Systems:  Constitutional  Feels well,   Cardiovascular  No chest pain, shortness of breath, or edema  Pulmonary  No cough or wheeze.  Gastro Intestinal  No nausea, vomitting, or diarrhoea. No bright red blood per rectum, no abdominal pain, change in bowel movement, or constipation.  Genito Urinary  No frequency, urgency, dysuria, no vaginal bleeding, no discharge, no incontinence.  Musculo Skeletal  Bilateral hands hurt every day. Neurologic  No weakness, numbness, change in gait,  Psychology  No depression, anxiety, insomnia.    Vitals: BP 140/88  Pulse 84  Temp(Src) 98.8 F (37.1 C)  Resp 18  Ht 5' 2.99" (1.6 m)  Wt 212 lb 13 oz (96.53 kg)  BMI 37.71 kg/m2  LMP 11/27/2012  Physical Exam: WD in NAD Neck  Supple NROM, without any enlargements.  Lymph Node Survey No cervical supraclavicular or inguinal adenopathy Cardiovascular  Pulse normal rate, regularity and rhythm. S1 and S2 normal.  Lungs  Clear to auscultation bilateraly, without wheezes/crackles/rhonchi. Good air movement.  Skin  No rash/lesions/breakdown  Psychiatry  Alert and oriented to person, place, and time  Abdomen  Normoactive bowel sounds, abdomen soft, non-tender and obese. Surgical  sites intact without evidence of hernia.  Back No CVA tenderness Genito Urinary  Vulva/vagina: Normal external female genitalia.  No lesions. No discharge or bleeding.  Bladder/urethra:  No lesions or masses  Vagina: Rugae present no lesions  Cervix: Normal appearing,   approximately 3, no lesions.  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  Adnexa: Unable to assess a mass because of her body habitus  Rectal  Good tone, no masses no cul de sac nodularity.  Extremities  No bilateral cyanosis, clubbing or edema.     Davina Howlett, MD, PhD 02/06/2013, 10:44 AM   

## 2013-02-27 NOTE — Anesthesia Preprocedure Evaluation (Addendum)
Anesthesia Evaluation  Patient identified by MRN, date of birth, ID band Patient awake    Reviewed: Allergy & Precautions, H&P , NPO status , Patient's Chart, lab work & pertinent test results  Airway Mallampati: II TM Distance: >3 FB Neck ROM: Full    Dental  (+) Loose and Dental Advisory Given,    Pulmonary Current Smoker,  CXR 02-22-13 reviewed. Pulmonary interstitial prominence. Probably chronic bronchitis. breath sounds clear to auscultation  Pulmonary exam normal       Cardiovascular Exercise Tolerance: Good hypertension, Pt. on medications +CHF + dysrhythmias Atrial Fibrillation Rhythm:Regular Rate:Normal  H/O CHF long time ago per patient. She does not know why she went into CHF. ECHO from 2009 reviewed. EF normal.  Cardiology notes reviewed. Not on coumadin due to social reasons.  Normal stress nuclear study on 02-16-13   Neuro/Psych PSYCHIATRIC DISORDERS Anxiety Depression negative neurological ROS     GI/Hepatic negative GI ROS, Neg liver ROS,   Endo/Other  diabetes, Type 2, Oral Hypoglycemic Agents  Renal/GU negative Renal ROS  negative genitourinary   Musculoskeletal negative musculoskeletal ROS (+)   Abdominal   Peds negative pediatric ROS (+)  Hematology negative hematology ROS (+)   Anesthesia Other Findings   Reproductive/Obstetrics negative OB ROS                      Anesthesia Physical Anesthesia Plan  ASA: III  Anesthesia Plan: General   Post-op Pain Management:    Induction: Intravenous  Airway Management Planned: Oral ETT  Additional Equipment:   Intra-op Plan:   Post-operative Plan: Extubation in OR  Informed Consent: I have reviewed the patients History and Physical, chart, labs and discussed the procedure including the risks, benefits and alternatives for the proposed anesthesia with the patient or authorized representative who has indicated his/her  understanding and acceptance.   Dental advisory given  Plan Discussed with: CRNA  Anesthesia Plan Comments:         Anesthesia Quick Evaluation

## 2013-02-27 NOTE — Transfer of Care (Signed)
Immediate Anesthesia Transfer of Care Note  Patient: Kathleen Kane  Procedure(s) Performed: Procedure(s) (LRB): EXPLORATORY LAPAROTOMY, right salpingo-oopherectomy (Right) SALPINGO OOPHORECTOMY (Right)  Patient Location: PACU  Anesthesia Type: General  Level of Consciousness: sedated, patient cooperative and responds to stimulaton  Airway & Oxygen Therapy: Patient Spontanous Breathing and Patient connected to face mask oxgen  Post-op Assessment: Report given to PACU RN and Post -op Vital signs reviewed and stable  Post vital signs: Reviewed and stable  Complications: No apparent anesthesia complications

## 2013-02-27 NOTE — Interval H&P Note (Signed)
History and Physical Interval Note:  02/27/2013 2:16 PM  Kathleen Kane  has presented today for surgery, with the diagnosis of PELVIC MASS  The various methods of treatment have been discussed with the patient and family. After consideration of risks, benefits and other options for treatment, the patient has consented to  Procedure(s): EXPLORATORY LAPAROTOMY, TAH , BSO , POSSIBLE STAGING (N/A) as a surgical intervention .  The patient's history has been reviewed, patient examined, no change in status, stable for surgery.  I have reviewed the patient's chart and labs.  Questions were answered to the patient's satisfaction.     [place], [facility]

## 2013-02-27 NOTE — Anesthesia Postprocedure Evaluation (Signed)
  Anesthesia Post-op Note  Patient: Kathleen Kane  Procedure(s) Performed: Procedure(s) (LRB): EXPLORATORY LAPAROTOMY, right salpingo-oopherectomy (Right) SALPINGO OOPHORECTOMY (Right)  Patient Location: PACU  Anesthesia Type: General  Level of Consciousness: awake and alert   Airway and Oxygen Therapy: Patient Spontanous Breathing  Post-op Pain: mild  Post-op Assessment: Post-op Vital signs reviewed, Patient's Cardiovascular Status Stable, Respiratory Function Stable, Patent Airway and No signs of Nausea or vomiting  Last Vitals:  Filed Vitals:   02/27/13 1735  BP: 176/75  Pulse: 64  Temp: 36.9 C  Resp: 16    Post-op Vital Signs: stable   Complications: No apparent anesthesia complications

## 2013-02-28 ENCOUNTER — Encounter (HOSPITAL_COMMUNITY): Payer: Self-pay | Admitting: Gynecologic Oncology

## 2013-02-28 LAB — GLUCOSE, CAPILLARY
Glucose-Capillary: 114 mg/dL — ABNORMAL HIGH (ref 70–99)
Glucose-Capillary: 102 mg/dL — ABNORMAL HIGH (ref 70–99)

## 2013-02-28 LAB — BASIC METABOLIC PANEL
BUN: 7 mg/dL (ref 6–23)
CO2: 28 mEq/L (ref 19–32)
Chloride: 102 mEq/L (ref 96–112)
Creatinine, Ser: 0.56 mg/dL (ref 0.50–1.10)

## 2013-02-28 LAB — CBC
HCT: 32.7 % — ABNORMAL LOW (ref 36.0–46.0)
MCHC: 33 g/dL (ref 30.0–36.0)
MCV: 83.2 fL (ref 78.0–100.0)
RDW: 13.7 % (ref 11.5–15.5)
WBC: 7.9 10*3/uL (ref 4.0–10.5)

## 2013-02-28 MED ORDER — INSULIN ASPART 100 UNIT/ML ~~LOC~~ SOLN: 0.0000 [IU] | Freq: Three times a day (TID) | SUBCUTANEOUS | Status: DC

## 2013-02-28 MED ORDER — METFORMIN HCL 500 MG PO TABS
500.0000 mg | ORAL_TABLET | Freq: Every evening | ORAL | Status: DC
  Administered 2013-02-28: 500 mg via ORAL
  Filled 2013-02-28 (×2): qty 1

## 2013-02-28 NOTE — Progress Notes (Signed)
1 Day Post-Op Procedure(s) (LRB): EXPLORATORY LAPAROTOMY, right salpingo-oopherectomy (Right) SALPINGO OOPHORECTOMY (Right)  Subjective: Patient reports tolerating diet.  Up with assist.  Adequate pain relief reported.  Requesting an abdominal binder for additional support.  Denies chest pain, dyspnea, nausea, vomiting, passing flatus, or having a bowel movement.  Objective: Vital signs in last 24 hours: Temp:  [97.6 F (36.4 C)-98.4 F (36.9 C)] 97.9 F (36.6 C) (04/23 0542) Pulse Rate:  [63-87] 73 (04/23 0542) Resp:  [10-18] 18 (04/23 0542) BP: (158-188)/(52-84) 158/70 mmHg (04/23 0542) SpO2:  [99 %-100 %] 100 % (04/23 0542) Weight:  [210 lb (95.255 kg)] 210 lb (95.255 kg) (04/22 1715)    Intake/Output from previous day: 04/22 0701 - 04/23 0700 In: 2472.9 [I.V.:2272.9; IV Piggyback:200] Out: 2600 [Urine:2550; Blood:50]  Physical Examination: General: alert, cooperative and no distress Resp: clear to auscultation bilaterally Cardio: regular rate and rhythm, S1, S2 normal, no murmur, click, rub or gallop GI: soft, non-tender; bowel sounds normal; no masses,  no organomegaly and incision: midline incision with sutures clean, dry, and intact.  Steri strips applied for additional support. Extremities: extremities normal, atraumatic, no cyanosis or edema  Labs: WBC/Hgb/Hct/Plts:  7.9/10.8/32.7/282 (04/23 0418) BUN/Cr/glu/ALT/AST/amyl/lip:  7/0.56/--/--/--/--/-- (04/23 0418)  Assessment: 52 y.o. s/p Procedure(s): EXPLORATORY LAPAROTOMY, right salpingo-oopherectomy SALPINGO OOPHORECTOMY: stable Pain:  Pain is well-controlled on oral medications.  Heme:  Hgb 10.8 and Hct 32.7 this am.  Stable post-operatively.  CV:  History of hypertension, CHF, and atrial fib.  Current treatment:  Lisinopril, lasix.     GI:  Tolerating po: Yes.    FEN:  Stable post-operatively.  Endo: Diabetes mellitus Type II, under good control..  CBG:  CBG (last 3)   Recent Labs  02/27/13 1836  02/27/13 2129 02/28/13 0710  GLUCAP 127* 134* 114*    Prophylaxis: pharmacologic prophylaxis (with any of the following: enoxaparin (Lovenox) 40mg  SQ 2 hours prior to surgery then every day) and intermittent pneumatic compression boots.  Plan: Saline lock IV Abdominal binder Restart metformin Carb modified diet Encourage ambulation Encourage IS use, deep breathing, and coughing Continue post-operative plan of care Plan for possible discharge in the am   LOS: 1 day    CROSS, MELISSA DEAL 02/28/2013, 9:26 AM

## 2013-03-01 LAB — GLUCOSE, CAPILLARY: Glucose-Capillary: 122 mg/dL — ABNORMAL HIGH (ref 70–99)

## 2013-03-01 MED ORDER — OXYCODONE-ACETAMINOPHEN 5-325 MG PO TABS: 1.0000 | ORAL_TABLET | ORAL | Status: DC | PRN

## 2013-03-01 MED ORDER — BISACODYL 10 MG RE SUPP
10.0000 mg | Freq: Once | RECTAL | Status: AC
  Administered 2013-03-01: 10 mg via RECTAL
  Filled 2013-03-01: qty 1

## 2013-03-01 MED ORDER — LOSARTAN POTASSIUM 50 MG PO TABS
100.0000 mg | ORAL_TABLET | Freq: Every day | ORAL | Status: DC
  Administered 2013-03-01: 100 mg via ORAL
  Filled 2013-03-01: qty 2

## 2013-03-01 NOTE — Discharge Summary (Signed)
Physician Discharge Summary  Patient ID: Kathleen Kane MRN: [id number] DOB/AGE: [birth year] 52 y.o.  Admit date: 02/27/2013 Discharge date: 03/01/2013  Admission Diagnoses: Pelvic mass in female  Discharge Diagnoses:  Principal Problem:   Pelvic mass in female  Discharged Condition:  The patient is in good condition and stable for discharge.  Hospital Course:On 02/27/2013, the patient underwent the following: Procedure(s):  EXPLORATORY LAPAROTOMY, right salpingo-oophorectomy SALPINGO OOPHORECTOMY.  The postoperative course was uneventful.  On the morning of POD 2, she developed shortness of breath and an EKG was performed, which resulted atrial fibrillation, with rate of 95 bpm.  [facility] was notified and an appointment was arranged for 03/02/13 at 10:40am for follow up.  After having a bowel movement and passing flatus, she reported that she felt "so much better" and the shortness of breath had resolved.  At 14:45 pm prior to discharge, a regular heart rate and rhythm was auscultated with a rate of 90 bpm.  She was discharged to home on postoperative day 2 tolerating a carb modified diet.  She had two bowel movements on the day of discharge.  Consults: None  Significant Diagnostic Studies: EKG  Treatments: surgery: see above  Discharge Exam: Blood pressure 138/76, pulse 93, temperature 97.7 F (36.5 C), temperature source Oral, resp. rate 20, height 5\' 3"  (1.6 m), weight 210 lb (95.255 kg), last menstrual period 11/27/2012, SpO2 98.00%. General appearance: alert, cooperative and no distress Resp: clear to auscultation bilaterally Cardio: regular rate and rhythm GI: soft, non-tender; bowel sounds normal; no masses,  no organomegaly and abdomen obese, binder in place Extremities: extremities normal, atraumatic, no cyanosis or edema Incision/Wound: Midline incision with steri strips clean, dry, and intact  Disposition: 01-Home or Self Care  Discharge Orders   Future Orders Complete By Expires     Call MD for:  difficulty breathing, headache or visual disturbances  As directed     Call MD for:  extreme fatigue  As directed     Call MD for:  hives  As directed     Call MD for:  persistant dizziness or light-headedness  As directed     Call MD for:  persistant nausea and vomiting  As directed     Call MD for:  redness, tenderness, or signs of infection (pain, swelling, redness, odor or green/yellow discharge around incision site)  As directed     Call MD for:  severe uncontrolled pain  As directed     Call MD for:  temperature >100.4  As directed     Diet - low sodium heart healthy  As directed     Discharge instructions  As directed     Comments:      Resume taking Losartan as prescribed, new medication prescribed for you from Wilburt Finlay, PA at [facility]    Driving Restrictions  As directed     Comments:      No driving for 2 weeks.  Do not take narcotics and drive.    Increase activity slowly  As directed     Lifting restrictions  As directed     Comments:      No lifting greater than 10 lbs.    Sexual Activity Restrictions  As directed     Comments:      No sexual activity, nothing in the vagina, for 6 weeks.        Medication List    STOP taking these medications  HYDROcodone-acetaminophen 5-325 MG per tablet  Commonly known as:  NORCO/VICODIN     lisinopril 20 MG tablet  Commonly known as:  PRINIVIL,ZESTRIL      TAKE these medications       aspirin 325 MG tablet  Take 325 mg by mouth daily.     furosemide 20 MG tablet  Commonly known as:  LASIX  Take 20 mg by mouth daily.     ibuprofen 200 MG tablet  Commonly known as:  ADVIL,MOTRIN  Take 800 mg by mouth every 8 (eight) hours as needed for pain.     metFORMIN 500 MG tablet  Commonly known as:  GLUCOPHAGE  Take 500 mg by mouth at bedtime.     oxyCODONE-acetaminophen 5-325 MG per tablet  Commonly known as:  PERCOCET/ROXICET  Take 1-2  tablets by mouth every 4 (four) hours as needed (severe pain).           Follow-up Information   Follow up with [facility], NP On 03/02/2013. (10:40 AM)    Contact information:   [facility] [address] [place] [address] [phone]       Please follow up. (GYN Oncology will call you with your final pathology results and to schedule a follow up appointment.)       Signed: CROSS, MELISSA DEAL 03/01/2013, 2:46 PM

## 2013-03-01 NOTE — Progress Notes (Signed)
2 Days Post-Op Procedure(s) (LRB): EXPLORATORY LAPAROTOMY, right salpingo-oopherectomy (Right) SALPINGO OOPHORECTOMY (Right)  Subjective: Patient reports having shortness of breath and she feels like she is in atrial fibrillation.  Symptoms started earlier this am with shortness of breath at rest.  She reports tolerating solid food.  Denies passing gas or having a bowel movement.  Ambulating without difficulty.  Adequate pain relief with PO Percocet use.  She refuses to take lisinopril because it makes her cough and she is unsure what medication she took in the past for atrial fibrillation.  She reports that she was given a new prescription for her blood pressure but she has not filled the medication and she is unsure of the name.  She refuses to take sliding scale insulin because she is afraid she "will become addicted."  She reports that she feels better after our discussion this am.    Objective: Vital signs in last 24 hours: Temp:  [98 F (36.7 C)-98.5 F (36.9 C)] 98 F (36.7 C) (04/24 0500) Pulse Rate:  [74-88] 88 (04/24 0500) Resp:  [18-20] 18 (04/24 0500) BP: (156-181)/(70-94) 166/70 mmHg (04/24 0500) SpO2:  [96 %-100 %] 96 % (04/24 0500) Last BM Date: 02/26/13  Intake/Output from previous day: 04/23 0701 - 04/24 0700 In: 1238.8 [P.O.:720; I.V.:518.8] Out: 3450 [Urine:3450]  Physical Examination: General: alert, cooperative and no distress Resp: clear to auscultation bilaterally Cardio: irregularly irregular rhythm GI: incision: midline incision with sutures, steri strips replaced, incision clean, dry, and intact, binder in place and abdomen soft, obese, hypoactive bowel sounds, mildly tympanic on percussion.l Extremities: extremities normal, atraumatic, no cyanosis or edema  Assessment: 52 y.o. s/p Procedure(s): EXPLORATORY LAPAROTOMY, right salpingo-oopherectomy SALPINGO OOPHORECTOMY: stable Pain:  Pain is well-controlled on oral medications.  Heme:  Stable  post-operatively.  CV:  Hx hypertension, a fib, and CHF.  Irregularly irregular heart rhythm at this time.  Plan to get EKG.  Restarted new medication started at last visit with B. Leron Croak, PA for [facility].  GI:  Tolerating po: Yes.  No flatus or bowel movement.    FEN:  Stable post-operatively.  Endo: Diabetes mellitus Type II, under good control.  CBG:  CBG (last 3)   Recent Labs  02/28/13 1627 02/28/13 2127 03/01/13 0725  GLUCAP 130* 102* 120*   Prophylaxis: intermittent pneumatic compression boots.  Plan: EKG now Restart Losartan Dulcolax suppository Call [facility] based on EKG results Encourage IS use, deep breathing, and coughing Continue post-operative plan of care Plan for possible discharge this afternoon   LOS: 2 days    CROSS, MELISSA DEAL 03/01/2013, 8:41 AM

## 2013-03-01 NOTE — Progress Notes (Signed)
Spoke with Wilburt Finlay, PA for [facility] about patient's current symptoms and EKG findings.  He stated that they would see her tomorrow in the office.  His office is to call the patient with the time.  Spoke with the patient about the plan.  No concerns voiced and verbalizing understanding.

## 2013-03-08 ENCOUNTER — Telehealth: Payer: Self-pay | Admitting: Gynecologic Oncology

## 2013-03-08 NOTE — Telephone Encounter (Signed)
Left message asking the patient to please call the office with an update of her post-operative status.

## 2013-04-09 ENCOUNTER — Telehealth: Payer: Self-pay | Admitting: Gynecologic Oncology

## 2013-04-09 NOTE — Telephone Encounter (Signed)
Post op check Note: Gyn-Onc   CC: Benign adnexal mass  Assessment/Plan:  [name]  is a 52 y.o.  year old with a right complex adnexal mass and minimally elevated CA 125. (53.)  She is s/p RSO for a benign mucinious cystadenofibroma.  F/U with Dr. Debroah Loop in 6 months. F/U with Gyn Onc prn  HPI: This is a 52 year old gravida 4 para 2 last which appeared in January of 2014. In February 2040 she noted abdominal pain primarily in the lower abdomen with radiation to the umbilicus. The pain spontaneously resolved it was not associated with any nausea or vomiting.  The pain then recurred in March of 2014 at which time she presented to emergency room. A CT of the abdomen and pelvis was performed A CT of the abdomen and pelvis showed 16.8 cm complex right pelvic mass. 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 A subpleural nodular density measuring 8 mm is appreciated in the right lower lobe. An ultrasound was collected on 02/01/2012  and the findings:  Uterus: Measures 12.2 x 9.3 x 9.6 cm and contains multiple fibroids. In the right aspect of the uterine body is a 3.6 x 3.4 x 3.3 cm intramural fibroid. In the posterior uterine body is a 6.2 x 6.1 x 5.8 cm intramural fibroid. In the left aspect of the uterus a 2.9 x 2.8 x 2.9 cm intramural fibroid.  Endometrium: Partially obscured by the fibroids. Where visualized, the endometrium measures 3.7 mm. Right ovary: In the right adnexa, and extending into the right upper quadrant of the abdomen, is a 17.5 x 11.4 x 18.2 cm markedly complex cystic mass with numerous internal septations and nodular soft tissue components. Septations measure up to 6 mm in thickness. Color Doppler flow is seen within some of the  septations and nodular components. No normal appearing right ovarian tissue is seen. Left ovary: Normal appearance/no adnexal mass. Measures 3.3 x 1.9 x 1.6 cm. Other findings: A  small to moderate amount of simple-appearing fluid is seen.     Lab Results  Component Value Date   CA125 28.4 02/22/2013   On 02/2013 she underwent RSO.   Path  Ovary and fallopian tube, right - MUCINOUS CYSTADENOMAFIBROMA, 18.9 CM IN GREATEST DIMENSION (1,887 GRAMS). - NO ATYPIA OR EVIDENCE OF MALIGNANCY. - BENIGN UNREMARKABLE FALLOPIAN TUBE TISSUE.  Past Surgical Hx:  Past Surgical History  Procedure Laterality Date  . Cesarean section      x2  . Left breast cyst    . Laparotomy Right 02/27/2013    Procedure: EXPLORATORY LAPAROTOMY, right salpingo-oopherectomy;  Surgeon: Laurette Schimke, MD;  Location: [facility];  Service: Gynecology;  Laterality: Right;  . Salpingoophorectomy Right 02/27/2013    Procedure: SALPINGO OOPHORECTOMY;  Surgeon: Laurette Schimke, MD;  Location: [facility];  Service: Gynecology;  Laterality: Right;    Past Medical Hx:  Past Medical History  Diagnosis Date  . Atrial fibrillation   . CHF (congestive heart failure)   . Atrial fibrillation   . Diabetes mellitus without complication   . Hypertension   . Depression   . Anxiety   . Arthritis     Past Gynecological History:   G4P2 Menarche 48. LMP 11/2012.  H/O OCP use.  Last normal pap 01/2013 wnl Lost mammogram 5-6 years ago  Family Hx: No family history on file.  Review of Systems:  Constitutional  Feels well,   Cardiovascular  No chest pain, shortness of breath, or  edema  Pulmonary  No cough or wheeze.  Gastro Intestinal  No nausea, vomitting, or diarrhoea. No bright red blood per rectum, no abdominal pain, change in bowel movement, or constipation.  Genito Urinary  No frequency, urgency, dysuria, no vaginal bleeding, no discharge, no incontinence.  Musculo Skeletal  Bilateral hands hurt every day. Neurologic  No weakness, numbness, change in gait,  Psychology  No depression, anxiety, insomnia.   Vitals: BP 140/88  Pulse 84  Temp(Src) 98.8 F (37.1 C)  Resp 18  Ht 5' 2.99" (1.6 m)  Wt  212 lb 13 oz (96.53 kg)  BMI 37.71 kg/m2  LMP 11/27/2012  Physical Exam: WD in NAD Neck  Supple NROM, without any enlargements.  Lymph Node Survey No cervical supraclavicular or inguinal adenopathy Cardiovascular  Pulse normal rate, regularity and rhythm. S1 and S2 normal.  Lungs  Clear to auscultation bilateraly, without wheezes/crackles/rhonchi. Good air movement.  Skin  No rash/lesions/breakdown  Psychiatry  Alert and oriented to person, place, and time  Abdomen  Normoactive bowel sounds, abdomen soft, non-tender and obese. Surgical  sites intact without evidence of hernia.  Back No CVA tenderness Genito Urinary  Vulva/vagina: Normal external female genitalia.  No lesions. No discharge or bleeding.  Bladder/urethra:  No lesions or masses  Vagina: Rugae present no lesions  Cervix: Normal appearing, approximately 3, no lesions.  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  Adnexa: Unable to assess a mass because of her body habitus  Rectal  Good tone, no masses no cul de sac nodularity.  Extremities  No bilateral cyanosis, clubbing or edema.     Laurette Schimke, MD, PhD 04/09/2013, 9:09 PM

## 2013-04-10 ENCOUNTER — Ambulatory Visit: Payer: Medicaid Other | Attending: Gynecologic Oncology | Admitting: Gynecologic Oncology

## 2013-04-10 ENCOUNTER — Encounter: Payer: Self-pay | Admitting: Gynecologic Oncology

## 2013-04-10 VITALS — BP 144/70 | HR 70 | Temp 97.9°F | Resp 18 | Ht 62.99 in | Wt 202.8 lb

## 2013-04-10 DIAGNOSIS — Z09 Encounter for follow-up examination after completed treatment for conditions other than malignant neoplasm: Secondary | ICD-10-CM | POA: Insufficient documentation

## 2013-04-10 DIAGNOSIS — R1900 Intra-abdominal and pelvic swelling, mass and lump, unspecified site: Secondary | ICD-10-CM

## 2013-04-10 DIAGNOSIS — E119 Type 2 diabetes mellitus without complications: Secondary | ICD-10-CM | POA: Insufficient documentation

## 2013-04-10 DIAGNOSIS — I4891 Unspecified atrial fibrillation: Secondary | ICD-10-CM | POA: Insufficient documentation

## 2013-04-10 DIAGNOSIS — I10 Essential (primary) hypertension: Secondary | ICD-10-CM | POA: Insufficient documentation

## 2013-04-10 DIAGNOSIS — R971 Elevated cancer antigen 125 [CA 125]: Secondary | ICD-10-CM | POA: Insufficient documentation

## 2013-04-10 DIAGNOSIS — D251 Intramural leiomyoma of uterus: Secondary | ICD-10-CM | POA: Insufficient documentation

## 2013-04-10 NOTE — Patient Instructions (Addendum)
Benign right ovarian cyst. F/U with Dr. Debroah Loop for well woman care, inclusive of mammography, bone density evaluation, cholesteraol and diabetes assesment.    Thank you very much Ms. ELINA STRENG for allowing me to provide care for you today.  I appreciate your confidence in choosing our Gynecologic Oncology team.  If you have any questions about your visit today please call our office and we will get back to you as soon as possible.  Maryclare Labrador. Galia Rahm MD., PhD Gynecologic Oncology

## 2013-04-10 NOTE — Progress Notes (Signed)
Post op check Note: Gyn-Onc   CC: Benign adnexal mass  Assessment/Plan:  [name]  is a 52 y.o.  year old with a right complex adnexal mass and minimally elevated CA 125. (53.)  She is s/p RSO for a benign mucinious cystadenofibroma.  F/U with Dr. Debroah Loop in 6 months for well woman evaluation.  Patient requests mammography bone density screening and was advised that Dr. Debroah Loop will include these preventative assessments in addition to the lipidemia and diabetes screening as is standard for his practice F/U with Gyn Onc prn  HPI: This is a 52 year old gravida 4 para 2 last which appeared in January of 2014. In February 2040 she noted abdominal pain primarily in the lower abdomen with radiation to the umbilicus. The pain spontaneously resolved it was not associated with any nausea or vomiting.  The pain then recurred in March of 2014 at which time she presented to emergency room. A CT of the abdomen and pelvis was performed A CT of the abdomen and pelvis showed 16.8 cm complex right pelvic mass. 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 A subpleural nodular density measuring 8 mm is appreciated in the right lower lobe. An ultrasound was collected on 02/01/2012  and the findings:  Uterus: Measures 12.2 x 9.3 x 9.6 cm and contains multiple fibroids. In the right aspect of the uterine body is a 3.6 x 3.4 x 3.3 cm intramural fibroid. In the posterior uterine body is a 6.2 x 6.1 x 5.8 cm intramural fibroid. In the left aspect of the uterus a 2.9 x 2.8 x 2.9 cm intramural fibroid.  Endometrium: Partially obscured by the fibroids. Where visualized, the endometrium measures 3.7 mm. Right ovary: In the right adnexa, and extending into the right upper quadrant of the abdomen, is a 17.5 x 11.4 x 18.2 cm markedly complex cystic mass with numerous internal septations and nodular soft tissue components. Septations measure up to 6 mm in  thickness. Color Doppler flow is seen within some of the  septations and nodular components. No normal appearing right ovarian tissue is seen. Left ovary: Normal appearance/no adnexal mass. Measures 3.3 x 1.9 x 1.6 cm. Other findings: A small to moderate amount of simple-appearing fluid is seen.    Lab Results  Component Value Date   CA125 28.4 02/22/2013   On 02/2013 she underwent RSO.   Path  Ovary and fallopian tube, right - MUCINOUS CYSTADENOMAFIBROMA, 18.9 CM IN GREATEST DIMENSION (1,887 GRAMS). - NO ATYPIA OR EVIDENCE OF MALIGNANCY. - BENIGN UNREMARKABLE FALLOPIAN TUBE TISSUE.  Past Surgical Hx:  Past Surgical History  Procedure Laterality Date  . Cesarean section      x2  . Left breast cyst    . Laparotomy Right 02/27/2013    Procedure: EXPLORATORY LAPAROTOMY, right salpingo-oopherectomy;  Surgeon: Laurette Schimke, MD;  Location: [facility];  Service: Gynecology;  Laterality: Right;  . Salpingoophorectomy Right 02/27/2013    Procedure: SALPINGO OOPHORECTOMY;  Surgeon: Laurette Schimke, MD;  Location: [facility];  Service: Gynecology;  Laterality: Right;    Past Medical Hx:  Past Medical History  Diagnosis Date  . Atrial fibrillation   . CHF (congestive heart failure)   . Atrial fibrillation   . Diabetes mellitus without complication   . Hypertension   . Depression   . Anxiety   . Arthritis     Past Gynecological History:   G4P2 Menarche 58. LMP 11/2012.  H/O OCP use.  Last normal pap  01/2013 wnl Lost mammogram 5-6 years ago  Family Hx: No family history on file.  Review of Systems:  Constitutional  Feels well,   Cardiovascular  No chest pain, shortness of breath, or edema  Pulmonary  No cough or wheeze.  Gastro Intestinal  No nausea, vomitting, or diarrhoea. No bright red blood per rectum, no abdominal pain, change in bowel movement, or constipation.  Genito Urinary  No frequency, urgency, dysuria, no vaginal bleeding, no discharge, no incontinence.  Musculo Skeletal   Bilateral hands hurt every day. Psychology  No depression, anxiety, insomnia.   Vitals: BP 144/70  Pulse 70  Temp(Src) 97.9 F (36.6 C) (Oral)  Resp 18  Ht 5' 2.99" (1.6 m)  Wt 202 lb 12.8 oz (91.989 kg)  BMI 35.93 kg/m2  Physical Exam: WD in NAD Cardiovascular  Pulse normal rate, regularity and rhythm. S1 and S2 normal.  Lungs  Clear to auscultation bilateraly, without wheezes/crackles/rhonchi. Good air movement.  Psychiatry  Alert and oriented to person, place, and time  Abdomen  Normoactive bowel sounds, abdomen soft, non-tender and obese. Surgical  sites intact without evidence of hernia.  Back No CVA tenderness Genito Urinary  Extremities  No bilateral cyanosis, clubbing or edema.     Laurette Schimke, MD, PhD 04/10/2013, 10:00 AM

## 2013-09-02 ENCOUNTER — Emergency Department (HOSPITAL_COMMUNITY): Payer: Medicaid Other

## 2013-09-02 ENCOUNTER — Encounter (HOSPITAL_COMMUNITY): Payer: Self-pay | Admitting: Emergency Medicine

## 2013-09-02 ENCOUNTER — Emergency Department (HOSPITAL_COMMUNITY)
Admission: EM | Admit: 2013-09-02 | Discharge: 2013-09-02 | Disposition: A | Discharge status: Home / Self Care | Payer: Medicaid Other | Attending: Emergency Medicine | Admitting: Emergency Medicine

## 2013-09-02 DIAGNOSIS — I509 Heart failure, unspecified: Secondary | ICD-10-CM | POA: Insufficient documentation

## 2013-09-02 DIAGNOSIS — R209 Unspecified disturbances of skin sensation: Secondary | ICD-10-CM | POA: Insufficient documentation

## 2013-09-02 DIAGNOSIS — Z8659 Personal history of other mental and behavioral disorders: Secondary | ICD-10-CM | POA: Insufficient documentation

## 2013-09-02 DIAGNOSIS — Z79899 Other long term (current) drug therapy: Secondary | ICD-10-CM | POA: Insufficient documentation

## 2013-09-02 DIAGNOSIS — E119 Type 2 diabetes mellitus without complications: Secondary | ICD-10-CM | POA: Insufficient documentation

## 2013-09-02 DIAGNOSIS — M25569 Pain in unspecified knee: Secondary | ICD-10-CM | POA: Insufficient documentation

## 2013-09-02 DIAGNOSIS — G5600 Carpal tunnel syndrome, unspecified upper limb: Secondary | ICD-10-CM | POA: Insufficient documentation

## 2013-09-02 DIAGNOSIS — F17200 Nicotine dependence, unspecified, uncomplicated: Secondary | ICD-10-CM | POA: Insufficient documentation

## 2013-09-02 DIAGNOSIS — I10 Essential (primary) hypertension: Secondary | ICD-10-CM | POA: Insufficient documentation

## 2013-09-02 DIAGNOSIS — M6281 Muscle weakness (generalized): Secondary | ICD-10-CM | POA: Insufficient documentation

## 2013-09-02 DIAGNOSIS — G5603 Carpal tunnel syndrome, bilateral upper limbs: Secondary | ICD-10-CM

## 2013-09-02 DIAGNOSIS — Z7982 Long term (current) use of aspirin: Secondary | ICD-10-CM | POA: Insufficient documentation

## 2013-09-02 DIAGNOSIS — M129 Arthropathy, unspecified: Secondary | ICD-10-CM | POA: Insufficient documentation

## 2013-09-02 DIAGNOSIS — M25579 Pain in unspecified ankle and joints of unspecified foot: Secondary | ICD-10-CM | POA: Insufficient documentation

## 2013-09-02 DIAGNOSIS — M25562 Pain in left knee: Secondary | ICD-10-CM

## 2013-09-02 MED ORDER — HYDROCODONE-ACETAMINOPHEN 5-325 MG PO TABS
2.0000 | ORAL_TABLET | Freq: Once | ORAL | Status: AC
  Administered 2013-09-02: 2 via ORAL
  Filled 2013-09-02: qty 2

## 2013-09-02 MED ORDER — HYDROCODONE-ACETAMINOPHEN 5-325 MG PO TABS: 2.0000 | ORAL_TABLET | ORAL | Status: DC | PRN

## 2013-09-02 MED ORDER — ENOXAPARIN SODIUM 100 MG/ML ~~LOC~~ SOLN
30.0000 mg | Freq: Once | SUBCUTANEOUS | Status: AC
  Administered 2013-09-02: 30 mg via SUBCUTANEOUS
  Filled 2013-09-02: qty 1

## 2013-09-02 NOTE — ED Provider Notes (Signed)
Medical screening examination/treatment/procedure(s) were performed by non-physician practitioner and as supervising physician I was immediately available for consultation/collaboration.   [name] T Kenzley Ke, MD 09/02/13 2318 

## 2013-09-02 NOTE — ED Provider Notes (Signed)
CSN: [id number]     Arrival date & time 09/02/13  1728 History   First MD Initiated Contact with Patient 09/02/13 1748     This chart was scribed for non-physician practitioner, Emilia Beck PA-C, working with Toy Baker, MD by Arlan Organ, ED Scribe. This patient was seen in room WTR6/WTR6 and the patient's care was started at 6:43 PM.   Chief Complaint  Patient presents with  . Leg Pain  . Hand Pain  . feet pain    Patient is a 52 y.o. female presenting with leg pain and hand pain. The history is provided by the patient. No language interpreter was used.  Leg Pain Location:  Leg Time since incident:  12 months Injury: no   Leg location:  L lower leg Pain details:    Severity:  Moderate   Onset quality:  Gradual   Timing:  Constant   Progression:  Unchanged Chronicity:  Chronic Hand Pain  Hand Pain Associated symptoms include arthralgias (leg, hand, and feet pain).   HPI Comments: Kathleen Kane is a 52 y.o. female with a hx of chronic shoulder pain who presents to the Emergency Department complaining of gradual onset, unchanged, constant  leg, hand, and feet pain that started a year ago. She denies any recent injury. Pt also reports associated numbness and weakness in her left arm.  She states she typically takes 12 Advil a day with mild relief.Pt has a hx of A-Fib, CHF, and DM. Pt denies any SOB or CP.  Pt states she monitors her blood sugar regularly, and typically reads around 146.   Past Medical History  Diagnosis Date  . Atrial fibrillation   . CHF (congestive heart failure)   . Atrial fibrillation   . Diabetes mellitus without complication   . Hypertension   . Depression   . Anxiety   . Arthritis    Past Surgical History  Procedure Laterality Date  . Cesarean section      x2  . Left breast cyst    . Laparotomy Right 02/27/2013    Procedure: EXPLORATORY LAPAROTOMY, right salpingo-oopherectomy;  Surgeon: Laurette Schimke, MD;  Location: [facility];   Service: Gynecology;  Laterality: Right;  . Salpingoophorectomy Right 02/27/2013    Procedure: SALPINGO OOPHORECTOMY;  Surgeon: Laurette Schimke, MD;  Location: [facility];  Service: Gynecology;  Laterality: Right;   No family history on file. History  Substance Use Topics  . Smoking status: Current Every Day Smoker -- 0.75 packs/day for 24 years    Types: Cigarettes  . Smokeless tobacco: Never Used     Comment: smoking cessation info given.   . Alcohol Use: No   OB History   Grav Para Term Preterm Abortions TAB SAB Ect Mult Living                 Review of Systems  Musculoskeletal: Positive for arthralgias (leg, hand, and feet pain).  All other systems reviewed and are negative.    Allergies  Review of patient's allergies indicates no known allergies.  Home Medications   Current Outpatient Rx  Name  Route  Sig  Dispense  Refill  . aspirin 325 MG tablet   Oral   Take 325 mg by mouth daily.         . furosemide (LASIX) 20 MG tablet   Oral   Take 20 mg by mouth daily.         Marland Kitchen ibuprofen (ADVIL,MOTRIN) 200 MG tablet   Oral  Take 800 mg by mouth every 8 (eight) hours as needed for pain.         . metFORMIN (GLUCOPHAGE) 500 MG tablet   Oral   Take 500 mg by mouth at bedtime.          BP 179/96  Pulse 95  Temp(Src) 98.2 F (36.8 C) (Oral)  Resp 18  SpO2 99%  Physical Exam  Nursing note and vitals reviewed. Constitutional: She is oriented to person, place, and time. She appears well-developed and well-nourished. No distress.  HENT:  Head: Normocephalic and atraumatic.  Eyes: Conjunctivae and EOM are normal.  Neck: Normal range of motion.  Cardiovascular: Normal rate, regular rhythm and intact distal pulses.  Exam reveals no gallop and no friction rub.   No murmur heard. Pulmonary/Chest: Effort normal and breath sounds normal. She has no wheezes. She has no rales. She exhibits no tenderness.  Musculoskeletal:  Left popliteal area tender to palpation and  edematous. ROM of left knee limited due to pain. Multiple soft, mobile growths of bilateral elbows, forearm. The growths are nontender to palpation.   Neurological: She is alert and oriented to person, place, and time. Coordination normal.  Speech is goal-oriented. Moves limbs without ataxia.   Skin: Skin is warm and dry.  Psychiatric: She has a normal mood and affect. Her behavior is normal.    ED Course  Procedures (including critical care time)  DIAGNOSTIC STUDIES: Oxygen Saturation is 99% on RA, Normal by my interpretation.    COORDINATION OF CARE: 6:44 PM- Will give pain medication. Will order X-Ray. Discussed treatment plan with pt at bedside and pt agreed to plan.     Labs Review Labs Reviewed - No data to display Imaging Review Dg Knee Complete 4 Views Left  09/02/2013   CLINICAL DATA:  Left knee pain.  EXAM: LEFT KNEE - COMPLETE 4+ VIEW  COMPARISON:  July 23, 2010.  FINDINGS: No fracture or dislocation is noted. Moderate suprapatellar joint effusion is noted. Spurring of the patella is noted. Mild joint space narrowing is noted with osteophyte formation.  IMPRESSION: Mild degenerative joint disease is noted. Moderate suprapatellar joint effusion is noted. No acute abnormality seen in the left knee.   Electronically Signed   By: Roque Lias M.D.   On: 09/02/2013 19:27    EKG Interpretation   None       MDM   1. Carpal tunnel syndrome, bilateral   2. Left knee pain     7:38 PM Xray shows no acute changes that are concerning at this time. Patient have left popliteal tenderness and swelling. Due to patient having atrial fibrillation and not anti coagulated, patient will have IM lovenox and return tomorrow for DVT study. Patient will have Vicodin for pain. She was given bilateral wrist splints for carpal tunnel. Patient instructed to follow up with PCP from resource guide for further evaluation. Vitals stable and patient afebrile.   I personally performed the  services described in this documentation, which was scribed in my presence. The recorded information has been reviewed and is accurate.    Emilia Beck, PA-C 09/02/13 1946

## 2013-09-02 NOTE — ED Notes (Signed)
Pt reports one year history of shoulder pain and numbness in l/arm. Multiple nodules noted on elbows an l/wrist. Pt reports pain in back in l/knee x 1 year

## 2013-09-03 ENCOUNTER — Ambulatory Visit (HOSPITAL_COMMUNITY)
Admission: RE | Admit: 2013-09-03 | Discharge: 2013-09-03 | Disposition: A | Discharge status: Home / Self Care | Payer: Medicaid Other | Source: Ambulatory Visit | Attending: Emergency Medicine | Admitting: Emergency Medicine

## 2013-09-03 DIAGNOSIS — M79609 Pain in unspecified limb: Secondary | ICD-10-CM | POA: Insufficient documentation

## 2013-09-03 NOTE — Progress Notes (Signed)
VASCULAR LAB PRELIMINARY  PRELIMINARY  PRELIMINARY  PRELIMINARY  Left lower extremity venous duplex completed.    Preliminary report:  Left:  No evidence of DVT, superficial thrombosis, or Baker's cyst.  Langdon Crosson, RVT 09/03/2013, 9:52 AM

## 2013-10-01 ENCOUNTER — Ambulatory Visit (INDEPENDENT_AMBULATORY_CARE_PROVIDER_SITE_OTHER): Payer: Medicaid Other | Admitting: Physician Assistant

## 2013-10-01 ENCOUNTER — Encounter: Payer: Self-pay | Admitting: Physician Assistant

## 2013-10-01 VITALS — BP 140/80 | HR 79 | Ht 62.0 in | Wt 223.0 lb

## 2013-10-01 DIAGNOSIS — F17200 Nicotine dependence, unspecified, uncomplicated: Secondary | ICD-10-CM

## 2013-10-01 DIAGNOSIS — Z720 Tobacco use: Secondary | ICD-10-CM

## 2013-10-01 DIAGNOSIS — R209 Unspecified disturbances of skin sensation: Secondary | ICD-10-CM

## 2013-10-01 DIAGNOSIS — I4891 Unspecified atrial fibrillation: Secondary | ICD-10-CM

## 2013-10-01 DIAGNOSIS — I4892 Unspecified atrial flutter: Secondary | ICD-10-CM

## 2013-10-01 DIAGNOSIS — R200 Anesthesia of skin: Secondary | ICD-10-CM | POA: Insufficient documentation

## 2013-10-01 MED ORDER — METOPROLOL TARTRATE 25 MG PO TABS: 12.5000 mg | ORAL_TABLET | Freq: Two times a day (BID) | ORAL | Status: DC

## 2013-10-01 NOTE — Assessment & Plan Note (Addendum)
This appears to be her biggest complaint right now.  She has multiple, firm, mobile, nontender masses ranging from 0.5cm to ~1.5cm over the olecranons and right scaphoid/radial head, the later of which may be placing pressure on the median nerve and causing her numbness.  I recommended she establish with a Primary provider(Dr. Dewey-If she takes Medicaid?) and will likely need a ortho evaluation.

## 2013-10-01 NOTE — Progress Notes (Signed)
Date:  10/01/2013   ID:  Kathleen Kane, DOB 05/13/1961, MRN [id number]  PCP:  None  Primary Cardiologist:  Allyson Sabal     History of Present Illness: Kathleen Kane is a 52 y.o. female secondary to post surgery PAF and here for followup. The patient is 52 years old. She has a history of mild obstructive sleep apnea, does not use CPAP; history of hypertension, paroxysmal afib, and tobacco abuse. She was seen February 09, 2013 for evaluation for surgical clearance due to a large 18 cm pelvic mass. She had a nuclear stress test, which was negative for ischemia; and she was cleared for surgery and is back today because postop she developed atrial fibrillation. After a bowel movement, she converted back to sinus rhythm.  Previously, patient had been on metoprolol tartrate for PAF, but it made her very fatigued; and she could not think straight, so she stopped taking it. She does continue aspirin; and on her last visit, her lisinopril was going to be switched to losartan due to an ACE cough, but she lost the prescription, so she has just not been taking anything for blood pressure. This morning at home, she also felt like she had gone into atrial fibrillation. When she does that, she eats two bananas and drinks water, and she starts feeling better. Reviewing her labs from the hospitalization, her potassium on February 28, 2013 was 3.6. Glucose was elevated at 121. She is diabetic, on metformin. Her tumor was found to be negative for malignancy with salpingo-oophorectomy, and the mass was 18 cm but no malignancy.  She had LE venous dopplers on Sep 04, 2103 with no evidence of DVT.  She presented today for evaluation and says she can feel a "light feeling" in her chest but does not have dizziness or SOB.  She currently denies nausea, vomiting, fever, chest pain, orthopnea, PND, cough, congestion, abdominal pain, hematochezia, melena, lower extremity edema, claudication.  She does complain of numbness in  the first three fingers on her right hand, pain in her wrist, decreased strength/grip and lump.  She also has two small lumps on the left elbow and one on the right elbow.    Wt Readings from Last 3 Encounters:  10/01/13 223 lb (101.152 kg)  04/10/13 202 lb 12.8 oz (91.989 kg)  02/27/13 210 lb (95.255 kg)     Past Medical History  Diagnosis Date  . Atrial fibrillation   . CHF (congestive heart failure)   . Atrial fibrillation   . Diabetes mellitus without complication   . Hypertension   . Depression   . Anxiety   . Arthritis     Current Outpatient Prescriptions  Medication Sig Dispense Refill  . furosemide (LASIX) 20 MG tablet Take 20 mg by mouth daily.      Marland Kitchen ibuprofen (ADVIL,MOTRIN) 200 MG tablet Take 800 mg by mouth every 8 (eight) hours as needed for pain.      Marland Kitchen aspirin 325 MG tablet Take 325 mg by mouth daily.      Marland Kitchen HYDROcodone-acetaminophen (NORCO/VICODIN) 5-325 MG per tablet Take 2 tablets by mouth every 4 (four) hours as needed for pain.  12 tablet  0  . metFORMIN (GLUCOPHAGE) 500 MG tablet Take 500 mg by mouth at bedtime.      . metoprolol tartrate (LOPRESSOR) 25 MG tablet Take 0.5 tablets (12.5 mg total) by mouth 2 (two) times daily.  180 tablet  3   No current facility-administered medications for this visit.  Allergies:   No Known Allergies  Social History:  The patient  reports that she has been smoking Cigarettes.  She has a 18 pack-year smoking history. She has never used smokeless tobacco. She reports that she does not drink alcohol or use illicit drugs.   Family history:  No family history on file.  ROS:  Please see the history of present illness.  All other systems reviewed and negative.   PHYSICAL EXAM: VS:  BP 140/80  Pulse 79  Ht 5\' 2"  (1.575 m)  Wt 223 lb (101.152 kg)  BMI 40.78 kg/m2  LMP 08/19/2013 Well nourished, well developed, in no acute distress HEENT: Pupils are equal round react to light accommodation extraocular movements are intact.   Neck: no JVDNo cervical lymphadenopathy. Cardiac:Irregular rate and rhythm without murmurs rubs or gallops. Lungs:  clear to auscultation bilaterally, no wheezing, rhonchi or rales Ext: no lower extremity edema.  2+ radial and left dorsalis pedis pulse, 1+ right DP.  ~1.5cm mobile mass on the lateral dorsum of left hand, two on the left elbow over the olecranon and one on the right elbow.  Skin: warm and dry Neuro:  Grossly normal  EKG:  Atrial flutter with well controlled rate.  79bpm  ASSESSMENT AND PLAN:  Problem List Items Addressed This Visit   Tobacco abuse     We discussed tobacco cessation.  She is not ready to quit.  I did ask her to smoke outside as she lives with her husband and adult daughter.    Numbness and tingling in left hand     This appears to be her biggest complaint right now.  She has multiple, firm, mobile, nontender masses ranging from 0.5cm to ~1.5cm over the olecranons and right scaphoid/radial head, the later of which may be placing pressure on the median nerve and causing her numbness.  I recommended she establish with a Primary provider(Dr. Dewey-If she takes Medicaid?) and will likely need a ortho evaluation.    Atrial fibrillation     Currently in atrial flutter with a well controlled rate but she complains of palpitations sometimes and feeling light in the chest.  It may be that her rate is increasing during those periods.  I am going to restart lopressor at 12.5mg  bid.  I know she complained of feeling poorly on it before but she tells me now that she was "going through something" back then.  I am not sure what that means and she did not elaborate.  Her CHADsVASC score is 3.  Currently on a full dose ASA but may consider coumadin.  She is in flutter and not afib so less risk of thrombus.    Relevant Medications      metoprolol tartrate (LOPRESSOR) tablet    Other Visit Diagnoses   Atrial flutter    -  Primary    Relevant Orders       EKG 12-Lead

## 2013-10-01 NOTE — Assessment & Plan Note (Signed)
Currently in atrial flutter with a well controlled rate but she complains of palpitations sometimes and feeling light in the chest.  It may be that her rate is increasing during those periods.  I am going to restart lopressor at 12.5mg  bid.  I know she complained of feeling poorly on it before but she tells me now that she was "going through something" back then.  I am not sure what that means and she did not elaborate.  Her CHADsVASC score is 3.  Currently on a full dose ASA but may consider coumadin.  She is in flutter and not afib so less risk of thrombus.

## 2013-10-01 NOTE — Patient Instructions (Signed)
1.  Start taking metoprolol 12.5 mg(one half a tablet) twice daily. 2.  If you are not ready to quit smoking, do it outside. 3.  Establish with a Primary doctor. 4.  Follow up with Dr. Allyson Sabal in three months.

## 2013-10-01 NOTE — Assessment & Plan Note (Signed)
We discussed tobacco cessation.  She is not ready to quit.  I did ask her to smoke outside as she lives with her husband and adult daughter.

## 2013-10-02 ENCOUNTER — Encounter: Payer: Self-pay | Admitting: Physician Assistant

## 2013-12-27 ENCOUNTER — Other Ambulatory Visit (HOSPITAL_COMMUNITY): Payer: Self-pay | Admitting: Internal Medicine

## 2013-12-27 ENCOUNTER — Ambulatory Visit (HOSPITAL_COMMUNITY)
Admission: RE | Admit: 2013-12-27 | Discharge: 2013-12-27 | Disposition: A | Discharge status: Home / Self Care | Payer: Medicaid Other | Source: Ambulatory Visit | Attending: Internal Medicine | Admitting: Internal Medicine

## 2013-12-27 DIAGNOSIS — M129 Arthropathy, unspecified: Secondary | ICD-10-CM | POA: Insufficient documentation

## 2013-12-27 DIAGNOSIS — M1990 Unspecified osteoarthritis, unspecified site: Secondary | ICD-10-CM

## 2013-12-27 DIAGNOSIS — M25569 Pain in unspecified knee: Secondary | ICD-10-CM | POA: Insufficient documentation

## 2013-12-27 DIAGNOSIS — M25469 Effusion, unspecified knee: Secondary | ICD-10-CM | POA: Insufficient documentation

## 2014-01-23 ENCOUNTER — Other Ambulatory Visit: Payer: Self-pay

## 2014-01-23 DIAGNOSIS — E790 Hyperuricemia without signs of inflammatory arthritis and tophaceous disease: Secondary | ICD-10-CM | POA: Insufficient documentation

## 2014-01-23 DIAGNOSIS — Z79899 Other long term (current) drug therapy: Secondary | ICD-10-CM | POA: Insufficient documentation

## 2014-02-04 ENCOUNTER — Other Ambulatory Visit: Payer: Self-pay | Admitting: Internal Medicine

## 2014-02-13 ENCOUNTER — Ambulatory Visit (INDEPENDENT_AMBULATORY_CARE_PROVIDER_SITE_OTHER): Payer: Medicaid Other

## 2014-02-13 VITALS — BP 167/84 | HR 76 | Resp 12

## 2014-02-13 DIAGNOSIS — E1149 Type 2 diabetes mellitus with other diabetic neurological complication: Secondary | ICD-10-CM

## 2014-02-13 DIAGNOSIS — E1140 Type 2 diabetes mellitus with diabetic neuropathy, unspecified: Secondary | ICD-10-CM

## 2014-02-13 DIAGNOSIS — E1142 Type 2 diabetes mellitus with diabetic polyneuropathy: Secondary | ICD-10-CM

## 2014-02-13 NOTE — Patient Instructions (Signed)
Diabetes and Foot Care Diabetes may cause you to have problems because of poor blood supply (circulation) to your feet and legs. This may cause the skin on your feet to become thinner, break easier, and heal more slowly. Your skin may become dry, and the skin may peel and crack. You may also have nerve damage in your legs and feet causing decreased feeling in them. You may not notice minor injuries to your feet that could lead to infections or more serious problems. Taking care of your feet is one of the most important things you can do for yourself.  HOME CARE INSTRUCTIONS  Wear shoes at all times, even in the house. Do not go barefoot. Bare feet are easily injured.  Check your feet daily for blisters, cuts, and redness. If you cannot see the bottom of your feet, use a mirror or ask someone for help.  Wash your feet with warm water (do not use hot water) and mild soap. Then pat your feet and the areas between your toes until they are completely dry. Do not soak your feet as this can dry your skin.  Apply a moisturizing lotion or petroleum jelly (that does not contain alcohol and is unscented) to the skin on your feet and to dry, brittle toenails. Do not apply lotion between your toes.  Trim your toenails straight across. Do not dig under them or around the cuticle. File the edges of your nails with an emery board or nail file.  Do not cut corns or calluses or try to remove them with medicine.  Wear clean socks or stockings every day. Make sure they are not too tight. Do not wear knee-high stockings since they may decrease blood flow to your legs.  Wear shoes that fit properly and have enough cushioning. To break in new shoes, wear them for just a few hours a day. This prevents you from injuring your feet. Always look in your shoes before you put them on to be sure there are no objects inside.  Do not cross your legs. This may decrease the blood flow to your feet.  If you find a minor scrape,  cut, or break in the skin on your feet, keep it and the skin around it clean and dry. These areas may be cleansed with mild soap and water. Do not cleanse the area with peroxide, alcohol, or iodine.  When you remove an adhesive bandage, be sure not to damage the skin around it.  If you have a wound, look at it several times a day to make sure it is healing.  Do not use heating pads or hot water bottles. They may burn your skin. If you have lost feeling in your feet or legs, you may not know it is happening until it is too late.  Make sure your health care provider performs a complete foot exam at least annually or more often if you have foot problems. Report any cuts, sores, or bruises to your health care provider immediately. SEEK MEDICAL CARE IF:   You have an injury that is not healing.  You have cuts or breaks in the skin.  You have an ingrown nail.  You notice redness on your legs or feet.  You feel burning or tingling in your legs or feet.  You have pain or cramps in your legs and feet.  Your legs or feet are numb.  Your feet always feel cold. SEEK IMMEDIATE MEDICAL CARE IF:   There is increasing redness,   swelling, or pain in or around a wound.  There is a red line that goes up your leg.  Pus is coming from a wound.  You develop a fever or as directed by your health care provider.  You notice a bad smell coming from an ulcer or wound. Document Released: 10/22/2000 Document Revised: 06/27/2013 Document Reviewed: 04/03/2013 [employer] Patient Information 2014 [employer].  

## 2014-02-13 NOTE — Progress Notes (Signed)
   Subjective:    Patient ID: Kathleen Kane, female    DOB: January 31, 1961, 53 y.o.   MRN: [id number]  HPI  PT STATED BOTH FEET AND ANKLE IS SWOLLEN, SORE AND NUMBNESS FEELING. BOTH FEET IS ABOUT THE SAME FOR 1 YEAR AND IS NOT GETTING WORSE. THE FEET GET AGGRAVATED BY WALKING AND PRESSURE. TRIED SOAK WITH WARM WATER AND PEPPERMINT SOAK.   Review of Systems  Constitutional: Positive for fatigue and unexpected weight change.  HENT: Positive for sneezing.   Respiratory: Positive for cough and shortness of breath.   Cardiovascular: Positive for leg swelling.  Endocrine: Positive for cold intolerance.  Genitourinary: Positive for urgency and frequency.  Musculoskeletal: Positive for gait problem and joint swelling.  Skin: Positive for rash.  Neurological: Positive for weakness and numbness.  All other systems reviewed and are negative.      Objective:   Physical Exam 53 year old [place] American female presents this time for diabetic foot evaluation. Patient is describing numbness sensation or abnormal sensation plain to both heel areas heel and arch. However not describe any pain however does have some difficulty with wearing certain shoes are currently wearing a pair toe shoes are more than 55 years old that need replacing. Objective findings as follows vascular status is intact with pedal pulses palpable DP postal for bilateral PT +2/4 bilateral capillary refill time 3 seconds all digits skin temperature is warm turgor normal no edema rubor pallor or varicosities noted. Neurologically epicritic and proprioceptive sensations are intact and symmetric bilateral. There is normal plantar response and DTRs. A slight decrease in sensation comes only at the inferior heel area on [employer] testing all other sites had intact sensation. Dermatologically skin color pigment normal hair growth absent nails somewhat criptotic and the third toe right power of the nails and removed years ago most the  nailbed show good good pink are clean nailbeds no signs of infection no dystrophy slight darkening and thickening of the third nail right which did regrow after nail excision. However no secondary pain or discomfort noted. Orthopedic biomechanical exam reveals rectus foot type mild flexible digital contractures are noted no x-rays taken at this time there are no cords calluses no deformities noted no significant abnormality noted.       Assessment & Plan:  Assessment this time is early diabetic neuropathy likely starting to affect the heel area with some abnormal sensation reported by the patient Powers not painful is not affecting her activity levels except for. I did make recommendations for proper athletic or Oxford type shoe U. balance Brooks or ASICS patient did ask about diabetic shoes however does not have any the class findings or requirements there is no orthopedic deformity no cords callus no history of ulceration or other complications early neuropathy at most at this time my recommendation is a 6-12 month followup for continued monitoring of her diabetes and diabetic foot neuropathy. Did encourage her to stay active maintain a good walking or tennis shoes and stockings Connor Kerlix socks at all times. Followup in reappointed 6-12 months for reevaluation diabetic foot exam  Harriet Masson DPM

## 2014-03-06 ENCOUNTER — Other Ambulatory Visit: Payer: Self-pay | Admitting: Internal Medicine

## 2014-03-06 DIAGNOSIS — N6002 Solitary cyst of left breast: Secondary | ICD-10-CM

## 2014-03-11 ENCOUNTER — Other Ambulatory Visit: Payer: Self-pay | Admitting: Internal Medicine

## 2014-03-11 DIAGNOSIS — E2839 Other primary ovarian failure: Secondary | ICD-10-CM

## 2014-03-12 ENCOUNTER — Encounter: Payer: Self-pay | Admitting: Advanced Practice Midwife

## 2014-03-15 ENCOUNTER — Other Ambulatory Visit: Payer: Medicaid Other

## 2014-03-25 DIAGNOSIS — E1149 Type 2 diabetes mellitus with other diabetic neurological complication: Secondary | ICD-10-CM

## 2014-04-09 ENCOUNTER — Ambulatory Visit: Payer: Medicaid Other | Admitting: Advanced Practice Midwife

## 2014-04-18 ENCOUNTER — Other Ambulatory Visit: Payer: Medicaid Other

## 2014-06-05 ENCOUNTER — Encounter (HOSPITAL_COMMUNITY): Payer: Self-pay | Admitting: Emergency Medicine

## 2014-06-05 ENCOUNTER — Emergency Department (HOSPITAL_COMMUNITY)
Admission: EM | Admit: 2014-06-05 | Discharge: 2014-06-05 | Disposition: A | Discharge status: Home / Self Care | Payer: Medicaid Other | Attending: Emergency Medicine | Admitting: Emergency Medicine

## 2014-06-05 DIAGNOSIS — R52 Pain, unspecified: Secondary | ICD-10-CM | POA: Insufficient documentation

## 2014-06-05 DIAGNOSIS — Z79899 Other long term (current) drug therapy: Secondary | ICD-10-CM | POA: Diagnosis not present

## 2014-06-05 DIAGNOSIS — I10 Essential (primary) hypertension: Secondary | ICD-10-CM | POA: Diagnosis not present

## 2014-06-05 DIAGNOSIS — M2550 Pain in unspecified joint: Secondary | ICD-10-CM | POA: Diagnosis not present

## 2014-06-05 DIAGNOSIS — Z8659 Personal history of other mental and behavioral disorders: Secondary | ICD-10-CM | POA: Insufficient documentation

## 2014-06-05 DIAGNOSIS — E119 Type 2 diabetes mellitus without complications: Secondary | ICD-10-CM | POA: Diagnosis not present

## 2014-06-05 DIAGNOSIS — I4891 Unspecified atrial fibrillation: Secondary | ICD-10-CM | POA: Insufficient documentation

## 2014-06-05 DIAGNOSIS — IMO0002 Reserved for concepts with insufficient information to code with codable children: Secondary | ICD-10-CM | POA: Insufficient documentation

## 2014-06-05 DIAGNOSIS — I509 Heart failure, unspecified: Secondary | ICD-10-CM | POA: Diagnosis not present

## 2014-06-05 DIAGNOSIS — F17200 Nicotine dependence, unspecified, uncomplicated: Secondary | ICD-10-CM | POA: Diagnosis not present

## 2014-06-05 DIAGNOSIS — M129 Arthropathy, unspecified: Secondary | ICD-10-CM | POA: Insufficient documentation

## 2014-06-05 MED ORDER — PREDNISONE 20 MG PO TABS: 40.0000 mg | ORAL_TABLET | Freq: Every day | ORAL | Status: DC

## 2014-06-05 MED ORDER — PREDNISONE 20 MG PO TABS
60.0000 mg | ORAL_TABLET | Freq: Once | ORAL | Status: AC
  Administered 2014-06-05: 60 mg via ORAL
  Filled 2014-06-05: qty 3

## 2014-06-05 MED ORDER — OXYCODONE-ACETAMINOPHEN 5-325 MG PO TABS: 1.0000 | ORAL_TABLET | ORAL | Status: DC | PRN

## 2014-06-05 MED ORDER — OXYCODONE-ACETAMINOPHEN 5-325 MG PO TABS
1.0000 | ORAL_TABLET | Freq: Once | ORAL | Status: AC
  Administered 2014-06-05: 1 via ORAL
  Filled 2014-06-05: qty 1

## 2014-06-05 NOTE — ED Notes (Signed)
Pt c/o gen body/joint pain and joint swelling x greater than 1 year.  Also states that when she takes a deep breath, her rt side hurts.  Also c/o rt thumb infection.

## 2014-06-05 NOTE — Discharge Instructions (Signed)

## 2014-06-05 NOTE — ED Notes (Signed)
MD at bedside. 

## 2014-06-10 NOTE — ED Provider Notes (Signed)
CSN: [id number]     Arrival date & time 06/05/14  1733 History   First MD Initiated Contact with Patient 06/05/14 1750     Chief Complaint  Patient presents with  . Generalized Body Aches     (Consider location/radiation/quality/duration/timing/severity/associated sxs/prior Treatment) HPI  53 year old female with polyarthralgia. Patient has a history of rheumatoid arthritis. She has been evaluated by rheumatologist in [place]. She was previously prescribed methotrexate and prednisone. She ran out of these and has had increasing pain. No fevers or chills. Denies any acute trauma. No respiratory complaints. No rash.  Past Medical History  Diagnosis Date  . Atrial fibrillation   . CHF (congestive heart failure)   . Atrial fibrillation   . Diabetes mellitus without complication   . Hypertension   . Depression   . Anxiety   . Arthritis    Past Surgical History  Procedure Laterality Date  . Cesarean section      x2  . Left breast cyst    . Laparotomy Right 02/27/2013    Procedure: EXPLORATORY LAPAROTOMY, right salpingo-oopherectomy;  Surgeon: [name], MD;  Location: [facility];  Service: Gynecology;  Laterality: Right;  . Salpingoophorectomy Right 02/27/2013    Procedure: SALPINGO OOPHORECTOMY;  Surgeon: [name], MD;  Location: [facility];  Service: Gynecology;  Laterality: Right;   No family history on file. History  Substance Use Topics  . Smoking status: Current Every Day Smoker -- 0.75 packs/day for 24 years    Types: Cigarettes  . Smokeless tobacco: Never Used     Comment: smoking cessation info given.   . Alcohol Use: No   OB History   Grav Para Term Preterm Abortions TAB SAB Ect Mult Living                 Review of Systems  All systems reviewed and negative, other than as noted in HPI.   Allergies  Review of patient's allergies indicates no known allergies.  Home Medications   Prior to Admission medications   Medication Sig Start Date End Date  Taking? Authorizing Provider  folic acid (FOLVITE) 1 MG tablet Take 1 mg by mouth daily.   Yes Historical Provider, MD  furosemide (LASIX) 20 MG tablet Take 20 mg by mouth as needed for fluid or edema.    Yes Historical Provider, MD  losartan (COZAAR) 25 MG tablet Take 25 mg by mouth daily.   Yes Historical Provider, MD  metFORMIN (GLUCOPHAGE) 500 MG tablet Take 500 mg by mouth at bedtime.   Yes Historical Provider, MD  methotrexate (RHEUMATREX) 2.5 MG tablet Take 10 mg by mouth once a week. Caution:Chemotherapy. Protect from light.   Yes Historical Provider, MD  metoprolol tartrate (LOPRESSOR) 25 MG tablet Take 0.5 tablets (12.5 mg total) by mouth 2 (two) times daily. 10/01/13  Yes Tarri Fuller, PA-C  oxyCODONE-acetaminophen (PERCOCET/ROXICET) 5-325 MG per tablet Take 1 tablet by mouth every 6 (six) hours as needed for severe pain.   Yes Historical Provider, MD  oxyCODONE-acetaminophen (PERCOCET/ROXICET) 5-325 MG per tablet Take 1-2 tablets by mouth every 4 (four) hours as needed for severe pain. 06/05/14   Virgel Manifold, MD  predniSONE (DELTASONE) 20 MG tablet Take 2 tablets (40 mg total) by mouth daily. 06/05/14   Virgel Manifold, MD   BP 154/73  Pulse 96  Temp(Src) 98.4 F (36.9 C) (Oral)  Resp 18  SpO2 100%  LMP 10/28/2013 Physical Exam  Nursing note and vitals reviewed. Constitutional: She appears well-developed and well-nourished. No distress.  HENT:  Head: Normocephalic and atraumatic.  Eyes: Conjunctivae are normal. Right eye exhibits no discharge. Left eye exhibits no discharge.  Neck: Neck supple.  Cardiovascular: Normal rate, regular rhythm and normal heart sounds.  Exam reveals no gallop and no friction rub.   No murmur heard. Pulmonary/Chest: Effort normal and breath sounds normal. No respiratory distress.  Abdominal: Soft. She exhibits no distension. There is no tenderness.  Musculoskeletal:  Swelling to bilateral hands. Able to actively range her wrist and her fingers  although she does endorse increased pain with this. Perhaps some mild swelling to her left knee. She is able to ambulate/airway on it. She is neurovascularly intact.  Neurological: She is alert.  Skin: Skin is warm and dry.  Psychiatric: She has a normal mood and affect. Her behavior is normal. Thought content normal.    ED Course  Procedures (including critical care time) Labs Review Labs Reviewed - No data to display  Imaging Review No results found.   EKG Interpretation None      MDM   Final diagnoses:  Arthralgia    53 year old female with polyarthralgia. Patient has a history of rheumatoid arthritis. She has been evaluated by rheumatologist. She was previously prescribed methotrexate and prednisone. She ran out of these and has had increasing pain. She realized that she actually has a refill for methotrexate. We'll prescribe a course of steroids. As needed pain medication. Follow back up with rheumatologist.    Virgel Manifold, MD 06/10/14 1221

## 2014-08-09 ENCOUNTER — Ambulatory Visit: Payer: Medicaid Other

## 2014-11-13 ENCOUNTER — Ambulatory Visit
Admission: RE | Admit: 2014-11-13 | Discharge: 2014-11-13 | Disposition: A | Discharge status: Home / Self Care | Payer: Medicaid Other | Source: Ambulatory Visit | Attending: Internal Medicine | Admitting: Internal Medicine

## 2014-11-13 ENCOUNTER — Other Ambulatory Visit: Payer: Self-pay | Admitting: Internal Medicine

## 2014-11-13 DIAGNOSIS — N6002 Solitary cyst of left breast: Secondary | ICD-10-CM

## 2014-11-13 DIAGNOSIS — E2839 Other primary ovarian failure: Secondary | ICD-10-CM

## 2014-12-17 ENCOUNTER — Emergency Department (HOSPITAL_COMMUNITY)
Admission: EM | Admit: 2014-12-17 | Discharge: 2014-12-17 | Disposition: A | Discharge status: Home / Self Care | Payer: Medicaid Other | Attending: Emergency Medicine | Admitting: Emergency Medicine

## 2014-12-17 ENCOUNTER — Encounter (HOSPITAL_COMMUNITY): Payer: Self-pay | Admitting: *Deleted

## 2014-12-17 ENCOUNTER — Emergency Department (HOSPITAL_COMMUNITY): Payer: Medicaid Other

## 2014-12-17 DIAGNOSIS — M1990 Unspecified osteoarthritis, unspecified site: Secondary | ICD-10-CM | POA: Diagnosis not present

## 2014-12-17 DIAGNOSIS — I5023 Acute on chronic systolic (congestive) heart failure: Secondary | ICD-10-CM | POA: Insufficient documentation

## 2014-12-17 DIAGNOSIS — Z79899 Other long term (current) drug therapy: Secondary | ICD-10-CM | POA: Insufficient documentation

## 2014-12-17 DIAGNOSIS — I10 Essential (primary) hypertension: Secondary | ICD-10-CM | POA: Insufficient documentation

## 2014-12-17 DIAGNOSIS — R0600 Dyspnea, unspecified: Secondary | ICD-10-CM

## 2014-12-17 DIAGNOSIS — E1165 Type 2 diabetes mellitus with hyperglycemia: Secondary | ICD-10-CM | POA: Diagnosis not present

## 2014-12-17 DIAGNOSIS — I517 Cardiomegaly: Secondary | ICD-10-CM | POA: Diagnosis not present

## 2014-12-17 DIAGNOSIS — Z720 Tobacco use: Secondary | ICD-10-CM | POA: Insufficient documentation

## 2014-12-17 DIAGNOSIS — Z8659 Personal history of other mental and behavioral disorders: Secondary | ICD-10-CM | POA: Diagnosis not present

## 2014-12-17 DIAGNOSIS — R739 Hyperglycemia, unspecified: Secondary | ICD-10-CM

## 2014-12-17 DIAGNOSIS — IMO0002 Reserved for concepts with insufficient information to code with codable children: Secondary | ICD-10-CM

## 2014-12-17 DIAGNOSIS — R0789 Other chest pain: Secondary | ICD-10-CM | POA: Diagnosis present

## 2014-12-17 DIAGNOSIS — Z7951 Long term (current) use of inhaled steroids: Secondary | ICD-10-CM | POA: Diagnosis not present

## 2014-12-17 LAB — CBC WITH DIFFERENTIAL/PLATELET
Basophils Absolute: 0 10*3/uL (ref 0.0–0.1)
Basophils Relative: 0 % (ref 0–1)
EOS ABS: 0.1 10*3/uL (ref 0.0–0.7)
EOS PCT: 2 % (ref 0–5)
HCT: 38.8 % (ref 36.0–46.0)
HEMOGLOBIN: 12.5 g/dL (ref 12.0–15.0)
LYMPHS ABS: 1.6 10*3/uL (ref 0.7–4.0)
Lymphocytes Relative: 26 % (ref 12–46)
MCH: 28.1 pg (ref 26.0–34.0)
MCHC: 32.2 g/dL (ref 30.0–36.0)
MCV: 87.2 fL (ref 78.0–100.0)
MONOS PCT: 9 % (ref 3–12)
Monocytes Absolute: 0.5 10*3/uL (ref 0.1–1.0)
Neutro Abs: 3.9 10*3/uL (ref 1.7–7.7)
Neutrophils Relative %: 63 % (ref 43–77)
Platelets: 308 10*3/uL (ref 150–400)
RBC: 4.45 MIL/uL (ref 3.87–5.11)
RDW: 13.7 % (ref 11.5–15.5)
WBC: 6.2 10*3/uL (ref 4.0–10.5)

## 2014-12-17 LAB — BASIC METABOLIC PANEL
ANION GAP: 12 (ref 5–15)
BUN: 20 mg/dL (ref 6–23)
CALCIUM: 9.5 mg/dL (ref 8.4–10.5)
CHLORIDE: 99 mmol/L (ref 96–112)
CO2: 25 mmol/L (ref 19–32)
Creatinine, Ser: 0.68 mg/dL (ref 0.50–1.10)
Glucose, Bld: 291 mg/dL — ABNORMAL HIGH (ref 70–99)
Potassium: 3.5 mmol/L (ref 3.5–5.1)
Sodium: 136 mmol/L (ref 135–145)

## 2014-12-17 LAB — BRAIN NATRIURETIC PEPTIDE: B Natriuretic Peptide: 133.9 pg/mL — ABNORMAL HIGH (ref 0.0–100.0)

## 2014-12-17 LAB — CBG MONITORING, ED: Glucose-Capillary: 225 mg/dL — ABNORMAL HIGH (ref 70–99)

## 2014-12-17 MED ORDER — INSULIN ASPART 100 UNIT/ML IV SOLN
4.0000 [IU] | Freq: Once | INTRAVENOUS | Status: AC
  Administered 2014-12-17: 4 [IU] via INTRAVENOUS
  Filled 2014-12-17: qty 0.04

## 2014-12-17 MED ORDER — FUROSEMIDE 10 MG/ML IJ SOLN
20.0000 mg | Freq: Once | INTRAMUSCULAR | Status: AC
  Administered 2014-12-17: 20 mg via INTRAVENOUS
  Filled 2014-12-17: qty 4

## 2014-12-17 NOTE — ED Provider Notes (Addendum)
CSN: [id number]     Arrival date & time 12/17/14  1000 History   First MD Initiated Contact with Patient 12/17/14 1018     Chief Complaint  Patient presents with  . swollen feet, hx CHF   . chest congestion      (Consider location/radiation/quality/duration/timing/severity/associated sxs/prior Treatment) The history is provided by the patient.    Past Medical History  Diagnosis Date  . Atrial fibrillation   . CHF (congestive heart failure)   . Atrial fibrillation   . Diabetes mellitus without complication   . Hypertension   . Depression   . Anxiety   . Arthritis    Past Surgical History  Procedure Laterality Date  . Cesarean section      x2  . Left breast cyst    . Laparotomy Right 02/27/2013    Procedure: EXPLORATORY LAPAROTOMY, right salpingo-oopherectomy;  Surgeon: [name], MD;  Location: [facility];  Service: Gynecology;  Laterality: Right;  . Salpingoophorectomy Right 02/27/2013    Procedure: SALPINGO OOPHORECTOMY;  Surgeon: [name], MD;  Location: [facility];  Service: Gynecology;  Laterality: Right;   History reviewed. No pertinent family history. History  Substance Use Topics  . Smoking status: Current Every Day Smoker -- 0.75 packs/day for 24 years    Types: Cigarettes  . Smokeless tobacco: Never Used     Comment: smoking cessation info given.   . Alcohol Use: No   OB History    No data available     Review of Systems  Constitutional: Positive for unexpected weight change. Negative for fever and chills.  HENT: Negative for congestion.   Eyes: Negative for visual disturbance.  Respiratory: Positive for cough and shortness of breath.   Cardiovascular: Positive for leg swelling (bilateral feet). Negative for chest pain.  Gastrointestinal: Negative for vomiting and abdominal pain.  Genitourinary: Negative for dysuria and flank pain.  Musculoskeletal: Negative for back pain, neck pain and neck stiffness.  Skin: Negative for rash.  Neurological:  Negative for light-headedness and headaches.      Allergies  Review of patient's allergies indicates no known allergies.  Home Medications   Prior to Admission medications   Medication Sig Start Date End Date Taking? Authorizing Provider  Cholecalciferol (VITAMIN D PO) Take 1 tablet by mouth daily.   Yes Historical Provider, MD  folic acid (FOLVITE) 1 MG tablet Take 1 mg by mouth daily.   Yes Historical Provider, MD  furosemide (LASIX) 20 MG tablet Take 20 mg by mouth as needed for fluid or edema.    Yes Historical Provider, MD  ibuprofen (ADVIL,MOTRIN) 200 MG tablet Take 800 mg by mouth every 6 (six) hours as needed for moderate pain.   Yes Historical Provider, MD  losartan (COZAAR) 25 MG tablet Take 25 mg by mouth daily.   Yes Historical Provider, MD  metFORMIN (GLUCOPHAGE) 500 MG tablet Take 500 mg by mouth at bedtime.   Yes Historical Provider, MD  methotrexate (RHEUMATREX) 2.5 MG tablet Take 10 mg by mouth once a week. Caution:Chemotherapy. Protect from light.   Yes Historical Provider, MD  metoprolol tartrate (LOPRESSOR) 25 MG tablet Take 0.5 tablets (12.5 mg total) by mouth 2 (two) times daily. 10/01/13  Yes Brett Canales, PA-C  predniSONE (DELTASONE) 10 MG tablet Take 10 mg by mouth daily with breakfast.   Yes Historical Provider, MD  Vitamin D, Ergocalciferol, (DRISDOL) 50000 UNITS CAPS capsule Take 1 capsule by mouth once a week. mondays 11/15/14  Yes Historical Provider, MD  oxyCODONE-acetaminophen (  PERCOCET/ROXICET) 5-325 MG per tablet Take 1-2 tablets by mouth every 4 (four) hours as needed for severe pain. Patient not taking: Reported on 12/17/2014 06/05/14   Virgel Manifold, MD  predniSONE (DELTASONE) 20 MG tablet Take 2 tablets (40 mg total) by mouth daily. Patient not taking: Reported on 12/17/2014 06/05/14   Virgel Manifold, MD   BP 158/89 mmHg  Pulse 96  Temp(Src) 97.6 F (36.4 C) (Oral)  Resp 19  Ht 5' 2.75" (1.594 m)  Wt 235 lb (106.595 kg)  BMI 41.95 kg/m2  SpO2 99%  LMP  10/28/2013 Physical Exam  Constitutional: She is oriented to person, place, and time. She appears well-developed and well-nourished.  HENT:  Head: Normocephalic and atraumatic.  Eyes: Conjunctivae are normal. Right eye exhibits no discharge. Left eye exhibits no discharge.  Neck: Normal range of motion. Neck supple. No tracheal deviation present.  Cardiovascular: Regular rhythm.   Pulmonary/Chest: Effort normal and breath sounds normal.  Abdominal: Soft. She exhibits no distension (obese). There is no tenderness. There is no guarding.  Musculoskeletal: She exhibits edema (mild le ankle/ feet bilateral).  Neurological: She is alert and oriented to person, place, and time.  Skin: Skin is warm. No rash noted.  Psychiatric: She has a normal mood and affect.  Nursing note and vitals reviewed.   ED Course  Procedures (including critical care time) Emergency Ultrasound: Limited Thoracic Performed and interpreted by Dr Reather Converse Longitudinal view of anterior left and right lung fields in real-time with linear probe. Indication: sob Findings: pos lung sliding no significant B lines Interpretation: no evidence of pneumothorax. Images electronically archived.       EMERGENCY DEPARTMENT [place] CARDIAC EXAM "Study: Limited Ultrasound of the heart and pericardium"  INDICATIONS:Dyspnea Multiple views of the heart and pericardium were obtained in real-time with a multi-frequency probe.  PERFORMED ID:[id number]  IMAGES ARCHIVED?: Yes  FINDINGS: No pericardial effusion, Normal contractility, IVC dilated and No cardiac activity  LIMITATIONS:  Body habitus  VIEWS USED: Parasternal long axis, Parasternal short axis, Apical 4 chamber  and Inferior Vena Cava  INTERPRETATION: Cardiac activity present, Pericardial effusioin absent and Probable elevated CVP  Emergency Ultrasound Study:   Angiocath insertion Performed by: Mariea Clonts  Consent: Verbal consent obtained. Risks and benefits: risks,  benefits and alternatives were discussed Immediately prior to procedure the correct patient, procedure, equipment, support staff and site/side marked as needed.  Indication: difficult IV access Preparation: Patient was prepped and draped in the usual sterile fashion. Vein Location: 20 g vein was visualized during assessment for potential access sites and was found to be patent/ easily compressed with linear ultrasound.  The needle was visualized with real-time ultrasound and guided into the vein. Gauge: right basilic  Image saved and stored.  Normal blood return.  Patient tolerance: Patient tolerated the procedure well with no immediate complications.     Labs Review Labs Reviewed  BASIC METABOLIC PANEL - Abnormal; Notable for the following:    Glucose, Bld 291 (*)    All other components within normal limits  BRAIN NATRIURETIC PEPTIDE - Abnormal; Notable for the following:    B Natriuretic Peptide 133.9 (*)    All other components within normal limits  CBC WITH DIFFERENTIAL/PLATELET  CBG MONITORING, ED    Imaging Review Dg Chest 2 View  12/17/2014   CLINICAL DATA:  Chest congestion.  Leg swelling.  EXAM: CHEST  2 VIEW  COMPARISON:  02/22/2013  FINDINGS: There is new slight cardiomegaly. Pulmonary vascularity is normal. No infiltrates  or effusions. No pulmonary edema. No significant osseous abnormality.  IMPRESSION: New borderline cardiomegaly.   Electronically Signed   By: Lorriane Shire M.D.   On: 12/17/2014 12:23     EKG Interpretation   Date/Time:  Tuesday December 17 2014 10:24:10 EST Ventricular Rate:  96 PR Interval:    QRS Duration: 92 QT Interval:  504 QTC Calculation: 637 R Axis:   44 Text Interpretation:  Atrial fibrillation Borderline T abnormalities,  diffuse leads Prolonged QT interval Similar to previous Confirmed by  Niyana Chesbro  MD, [place]essa Morren ([id number]) on 12/17/2014 10:37:32 AM      MDM   Final diagnoses:  Cardiomegaly  Dyspnea  Acute on chronic systolic  congestive heart failure  Essential hypertension  Diabetes mellitus type II, uncontrolled  Hyperglycemia   Acute on chronic digestive heart failure with weight gain, gradually worsening shortness of breath and leg swelling. Bedside ultrasound and chest x-ray reviewed no significant pulmonary edema, patient not requiring oxygen. Lasix given in the ER. Patient has no chest pain. Small insulin dose given the ER for uncontrolled glucose. Patient will need very close follow-up for congestive heart failure treatment and diabetic management and high blood pressure outpatient. Patient has a history of atrial fibrillation and is on aspirin. We discussed that she may need to be on anticoagulation pending detailed discussion with her cardiologist. Patient also on chronic prednisone which also may be continuing to weight gain and diffuse swelling. Patient well-appearing the ER, vitals unremarkable except for mild elevated blood pressure, not requiring oxygen in no respiratory difficulty. Results and differential diagnosis were discussed with the patient/parent/guardian. Close follow up outpatient was discussed, comfortable with the plan.   Medications  furosemide (LASIX) injection 20 mg (20 mg Intravenous Given 12/17/14 1203)  insulin aspart (novoLOG) injection 4 Units (4 Units Intravenous Given 12/17/14 1258)    Filed Vitals:   12/17/14 1027 12/17/14 1108 12/17/14 1300  BP: 143/83  158/89  Pulse: 103  96  Temp: 97.6 F (36.4 C)    TempSrc: Oral    Resp: 16  19  Height:  5' 2.75" (1.594 m)   Weight:  235 lb (106.595 kg)   SpO2: 98%  99%    Final diagnoses:  Cardiomegaly  Dyspnea  Acute on chronic systolic congestive heart failure  Essential hypertension  Diabetes mellitus type II, uncontrolled  Hyperglycemia    Discussed with cardiology who arranged an urgent appointment with the patient to more afternoon for reassessment. Discussed increasing Lasix to 20 mg twice daily.    Mariea Clonts,  MD 12/17/14 1348  Mariea Clonts, MD 12/17/14 [phone]

## 2014-12-17 NOTE — ED Notes (Signed)
Patient transported to X-ray 

## 2014-12-17 NOTE — Discharge Instructions (Signed)
Call your cardiologist today to discuss appointment later in the week. Double your Lasix dose to 20 mg twice daily Discuss your diabetes management with her primary doctor. Continue to improve the diet.  If you were given medicines take as directed.  If you are on coumadin or contraceptives realize their levels and effectiveness is altered by many different medicines.  If you have any reaction (rash, tongues swelling, other) to the medicines stop taking and see a physician.   Please follow up as directed and return to the ER or see a physician for new or worsening symptoms.  Thank you. Filed Vitals:   12/17/14 1027 12/17/14 1108 12/17/14 1300  BP: 143/83  158/89  Pulse: 103  96  Temp: 97.6 F (36.4 C)    TempSrc: Oral    Resp: 16  19  Height:  5' 2.75" (1.594 m)   Weight:  235 lb (106.595 kg)   SpO2: 98%  99%   You have an appointment with the cardiologist tomorrow at 1:30 PM Kathleen Kane.

## 2014-12-17 NOTE — ED Notes (Signed)
MD at bedside. 

## 2014-12-17 NOTE — ED Notes (Addendum)
Pt reports hx of CHF, DM, afib, and HTN. Pt went to pcp on 2/2 and was told she needed lasix, pt has taken lasix x5 days now. Reports SOB (able to speak in full sentences) x2 weeks, swelling to feet x2 weeks and chest congestion,and  shoulder blade pain x3 days. Pain 8/10 pt also has chronic pain from rheumatoid arthritis. Denies chest pain.

## 2014-12-17 NOTE — ED Notes (Signed)
MD at bedside. EDP ZAVITZ PRESENT TO RE EVALUATE THIS PT

## 2014-12-18 ENCOUNTER — Encounter: Payer: Medicaid Other | Admitting: Physician Assistant

## 2014-12-18 ENCOUNTER — Ambulatory Visit (INDEPENDENT_AMBULATORY_CARE_PROVIDER_SITE_OTHER): Payer: Medicaid Other | Admitting: Physician Assistant

## 2014-12-18 ENCOUNTER — Encounter: Payer: Self-pay | Admitting: Physician Assistant

## 2014-12-18 ENCOUNTER — Telehealth: Payer: Self-pay

## 2014-12-18 VITALS — BP 115/82 | HR 73

## 2014-12-18 DIAGNOSIS — I5033 Acute on chronic diastolic (congestive) heart failure: Secondary | ICD-10-CM | POA: Diagnosis not present

## 2014-12-18 DIAGNOSIS — I4819 Other persistent atrial fibrillation: Secondary | ICD-10-CM

## 2014-12-18 DIAGNOSIS — I481 Persistent atrial fibrillation: Secondary | ICD-10-CM | POA: Diagnosis not present

## 2014-12-18 MED ORDER — POTASSIUM CHLORIDE CRYS ER 20 MEQ PO TBCR: EXTENDED_RELEASE_TABLET | ORAL | Status: DC

## 2014-12-18 MED ORDER — RIVAROXABAN 20 MG PO TABS: 20.0000 mg | ORAL_TABLET | Freq: Every day | ORAL | Status: DC

## 2014-12-18 MED ORDER — FUROSEMIDE 20 MG PO TABS: 20.0000 mg | ORAL_TABLET | Freq: Every day | ORAL | Status: DC

## 2014-12-18 NOTE — Progress Notes (Signed)
Cardiology Office Note   Date:  12/18/2014   ID:  Kathleen Kane, DOB 1961/02/28, MRN [id number]  PCP:  Philis Fendt, MD  Cardiologist: Dr. Normajean Baxter, PA-C   Chief Complaint  Patient presents with  . Edema   History of Present Illness: Kathleen Kane is a 54 y.o. female who presents for evaluation of SOB, LE edema.  She has a history of HTN, atrial fib, tob use, DM, not on coumadin due to concerns for compliance with meds and f/u. Newer agents too expensive.   Seen by Dr. Johnsie Cancel 2013, in atrial fib then, he felt not a good coumadin candidate due to compliance, understanding of her condition.  Seen 2014 pre-op for Right mucinous cystadenofibroma  surgery and had MV, no scar/ischemia and EF 61%. Seen post-op for afib, no SR ECGs in system.  Seen 09/2013 for atrial fib and started on low-dose metoprolol at 12.5 mg bid. She was encouraged to obtain a primary MD.  Seen in ER 12/17/2014 for SOB & LE edema after seeing primary MD and taking Lasix x 5 days. She was given Lasix 20 mg IV in the ER and appt made for today.  Pt still with LE edema and has DOE. However, can talk in complete sentences and denies PND, has some orthopnea. She has improved compliance with meds/appts since she has Medicaid and her daughter is involved in her care.   She has no awareness of the afib at baseline and only occasionally has palpitations, always brief, never with associated symptoms. She describes herself as clumsy, feels she sits heavily in chairs, sort of falling into them, but has not fallen to the floor.   She does not weigh herself and feels she might have gained some weight, but does not know now much.  She does not have any history of hematemesis, hematuria or hemoptysis. No other recent illnesses, fevers or chills.   Past Medical History  Diagnosis Date  . Atrial fibrillation, persistent   . CHF (congestive heart failure) 2013    No echo found from that time, most likely  diastolic  . Diabetes mellitus without complication   . Hypertension   . Depression   . Anxiety   . Rheumatoid arthritis     Past Surgical History  Procedure Laterality Date  . Cesarean section      x2  . Left breast cyst    . Laparotomy Right 02/27/2013    Procedure: EXPLORATORY LAPAROTOMY, right salpingo-oopherectomy;  Surgeon: [name], MD;  Location: [facility];  Service: Gynecology;  Laterality: Right;  . Salpingoophorectomy Right 02/27/2013    Procedure: SALPINGO OOPHORECTOMY;  Surgeon: [name], MD;  Location: [facility];  Service: Gynecology;  Laterality: Right;    Current Outpatient Prescriptions  Medication Sig Dispense Refill  . folic acid (FOLVITE) 1 MG tablet Take 1 mg by mouth daily.    . furosemide (LASIX) 20 MG tablet Take 1 tablet (20 mg total) by mouth daily. Take 20 mg twice daily by mouth for four days (12/18/2013 to 12/21/2014), then take 20 mg by mouth daily 30 tablet 3  . ibuprofen (ADVIL,MOTRIN) 200 MG tablet Take 800 mg by mouth every 6 (six) hours as needed for moderate pain.    Marland Kitchen losartan (COZAAR) 25 MG tablet Take 25 mg by mouth daily.    . metFORMIN (GLUCOPHAGE) 500 MG tablet Take 500 mg by mouth at bedtime.    . methotrexate (RHEUMATREX) 2.5 MG tablet Take 10 mg by  mouth once a week. Caution:Chemotherapy. Protect from light.    . metoprolol tartrate (LOPRESSOR) 25 MG tablet Take 0.5 tablets (12.5 mg total) by mouth 2 (two) times daily. 180 tablet 3  . potassium chloride SA (KLOR-CON M20) 20 MEQ tablet Take 20 mg daily for four days 4 tablet 0   Allergies:   Ace inhibitors   Social History:  The patient  reports that she quit smoking about 6 months ago. Her smoking use included Cigarettes. She has a 18 pack-year smoking history. She has never used smokeless tobacco. She reports that she does not drink alcohol or use illicit drugs.   Family History:  The patient's family is negative for premature CAD. Mother is living, no hx CAD, remote hx palpitations.  Father is deceased.   ROS:  Please see the history of present illness.   Otherwise, review of systems are positive for chronic MS complaints from the RA, peripheral neuropathy symptoms in her feet. She takes high-dose ibuprofen regularly, so far with no GI upset. All other systems are reviewed and negative.    PHYSICAL EXAM: VS:  BP 115/82 mmHg  Pulse 73  SpO2 98%  LMP 10/28/2013 , BMI There is no weight on file to calculate BMI. GEN: Well nourished, well developed, in no acute distress HEENT: normal, but poor dentition Neck: JVP 8-9 cm, no carotid bruits, or masses Cardiac: Irreg irreg; no murmurs, rubs, or gallops, 1+ LE edema  Respiratory: decreased BS bases with few rales, normal work of breathing GI: soft, nontender, nondistended, + BS MS: no deformity or atrophy Skin: warm and dry, no rash Neuro:  Strength and sensation are intact Psych: euthymic mood, full affect   EKG:  EKG is not ordered today. The ECG from ER 02/09 shows atrial fib, controlled rate, no acute ischemic changes  Recent Labs: 12/17/2014: B Natriuretic Peptide 133.9*; BUN 20; Creatinine 0.68; Hemoglobin 12.5; Platelets 308; Potassium 3.5; Sodium 136   Lipid Panel    Component Value Date/Time   CHOL  01/24/2008 0330    155        ATP III CLASSIFICATION:  <200     mg/dL   Desirable  200-239  mg/dL   Borderline High  >=240    mg/dL   High   TRIG 141 01/24/2008 0330   HDL 30* 01/24/2008 0330   CHOLHDL 5.2 01/24/2008 0330   VLDL 28 01/24/2008 0330   LDLCALC  01/24/2008 0330    97        Total Cholesterol/HDL:CHD Risk Coronary Heart Disease Risk Table                     Men   Women  1/2 Average Risk   3.4   3.3    Wt Readings from Last 3 Encounters:  12/17/14 235 lb (106.595 kg)  10/01/13 223 lb (101.152 kg)  04/10/13 202 lb 12.8 oz (91.989 kg)     Other studies Reviewed: Additional studies/ records that were reviewed today include: previous echo, recent labs/CXR. Review of the above records  demonstrates: normal renal function, elevated CBG   ASSESSMENT AND PLAN:  1.  Acute on chronic diastolic CHF (congestive heart failure), NYHA class 3 - Patient still with some volume overload. Her weight is up in the system, but the last weight is > 1 yr ago  - Will increase her Lasix to BID x 4 days to improve volume status  - She needs to be on daily Lasix for better volume/BP  control, 20 mg daily - K+ was borderline yesterday in ER, will give her Kdur 20 meq x 4 days, while on BID Lasix - daily weights - f/u w/ PN in 3-4 weeks w/ BMET  - get echocardiogram at next office visit  Persistent atrial fibrillation - rate control OK on current rx, no change in BB - anticoagulation indicated This patients CHA2DS2-VASc Score and unadjusted Ischemic Stroke Rate (% per year) is equal to 4.8 % stroke rate/year from a score of 4  Above score calculated as 1 point each if present [CHF, HTN, DM, Vascular=MI/PAD/Aortic Plaque, Age if 65-74, or Female] Above score calculated as 2 points each if present [Age > 75, or Stroke/TIA/TE]  Current medicines are reviewed at length with the patient today.  The patient does not have concerns regarding medicines.  The following changes have been made:  Xarelto 20 mg daily, coupon and rx given for 1 month, Lasix 20 mg bid x 4 days, then 20 mg qd, Kdur 20 meq qd x 4 days then stop. Decrease use of Ibuprofen as much as possible.  Labs/ tests ordered today include: none   Orders Placed This Encounter  Procedures  . Basic Metabolic Panel (BMET)    Disposition:   FU with Dr Johnsie Cancel in 3 weeks with BMET and echo  Dr Johnsie Cancel was in to see the patient, plan for anticoagulation and improved volume status was reviewed and he is in agreement.  Jonetta Speak, PA-C  12/18/2014 3:10 PM    [facility] [address], [place], [place]  [address] Phone: [phone]; Fax: [phone]

## 2014-12-18 NOTE — Telephone Encounter (Signed)
Left message for patient to call back. Patient needs to be scheduled with Dr. Johnsie Cancel early March and a BMET on the same day.

## 2014-12-18 NOTE — Patient Instructions (Addendum)
Your physician has recommended you make the following change in your medication:   Start Rivaroxaban (Xarelto) 20 mg by mouth daily  Start Furosemide (Lasix) 20 mg by mouth twice daily for four days, then take Lasix 20 mg by mouth daily  Start Potassium 20 mg by mouth once daily for four days.  Please weigh yourself daily at the same time and record your weight.   Your physician recommends that you schedule a follow-up appointment in: early March with Dr. Johnsie Cancel  Your physician recommends that you return for lab work in: early March for BMET on same day as appt with Dr. Johnsie Cancel.  Your physician has requested that you have an echocardiogram. Echocardiography is a painless test that uses sound waves to create images of your heart. It provides your doctor with information about the size and shape of your heart and how well your heart's chambers and valves are working. This procedure takes approximately one hour. There are no restrictions for this procedure.

## 2014-12-20 ENCOUNTER — Other Ambulatory Visit: Payer: Self-pay | Admitting: *Deleted

## 2014-12-20 DIAGNOSIS — Z79899 Other long term (current) drug therapy: Secondary | ICD-10-CM

## 2014-12-20 NOTE — Telephone Encounter (Signed)
APPT MADE   WITH  DR Johnsie Cancel FOR   01-17-15  AT  3:45 PM .Adonis Housekeeper

## 2014-12-24 ENCOUNTER — Ambulatory Visit (HOSPITAL_COMMUNITY): Payer: Medicaid Other | Attending: Physician Assistant | Admitting: Cardiology

## 2014-12-24 DIAGNOSIS — I5033 Acute on chronic diastolic (congestive) heart failure: Secondary | ICD-10-CM | POA: Diagnosis not present

## 2014-12-24 DIAGNOSIS — E669 Obesity, unspecified: Secondary | ICD-10-CM | POA: Insufficient documentation

## 2014-12-24 DIAGNOSIS — E119 Type 2 diabetes mellitus without complications: Secondary | ICD-10-CM | POA: Insufficient documentation

## 2014-12-24 NOTE — Progress Notes (Signed)
Echo performed. 

## 2014-12-28 ENCOUNTER — Emergency Department (HOSPITAL_COMMUNITY)
Admission: EM | Admit: 2014-12-28 | Discharge: 2014-12-28 | Disposition: A | Discharge status: Home / Self Care | Payer: Medicaid Other | Attending: Emergency Medicine | Admitting: Emergency Medicine

## 2014-12-28 ENCOUNTER — Encounter (HOSPITAL_COMMUNITY): Payer: Self-pay | Admitting: Emergency Medicine

## 2014-12-28 DIAGNOSIS — M069 Rheumatoid arthritis, unspecified: Secondary | ICD-10-CM | POA: Diagnosis not present

## 2014-12-28 DIAGNOSIS — R739 Hyperglycemia, unspecified: Secondary | ICD-10-CM

## 2014-12-28 DIAGNOSIS — I10 Essential (primary) hypertension: Secondary | ICD-10-CM | POA: Insufficient documentation

## 2014-12-28 DIAGNOSIS — Z8659 Personal history of other mental and behavioral disorders: Secondary | ICD-10-CM | POA: Insufficient documentation

## 2014-12-28 DIAGNOSIS — Z87891 Personal history of nicotine dependence: Secondary | ICD-10-CM | POA: Diagnosis not present

## 2014-12-28 DIAGNOSIS — Z7901 Long term (current) use of anticoagulants: Secondary | ICD-10-CM | POA: Insufficient documentation

## 2014-12-28 DIAGNOSIS — E1165 Type 2 diabetes mellitus with hyperglycemia: Secondary | ICD-10-CM | POA: Insufficient documentation

## 2014-12-28 DIAGNOSIS — I509 Heart failure, unspecified: Secondary | ICD-10-CM | POA: Diagnosis not present

## 2014-12-28 DIAGNOSIS — Z79899 Other long term (current) drug therapy: Secondary | ICD-10-CM | POA: Insufficient documentation

## 2014-12-28 LAB — CBC
HCT: 34.7 % — ABNORMAL LOW (ref 36.0–46.0)
Hemoglobin: 11.1 g/dL — ABNORMAL LOW (ref 12.0–15.0)
MCH: 27.8 pg (ref 26.0–34.0)
MCHC: 32 g/dL (ref 30.0–36.0)
MCV: 86.8 fL (ref 78.0–100.0)
Platelets: 391 K/uL (ref 150–400)
RBC: 4 MIL/uL (ref 3.87–5.11)
RDW: 13.1 % (ref 11.5–15.5)
WBC: 8.1 K/uL (ref 4.0–10.5)

## 2014-12-28 LAB — COMPREHENSIVE METABOLIC PANEL WITH GFR
ALT: 16 U/L (ref 0–35)
AST: 17 U/L (ref 0–37)
Albumin: 3.7 g/dL (ref 3.5–5.2)
Alkaline Phosphatase: 68 U/L (ref 39–117)
Anion gap: 11 (ref 5–15)
BUN: 17 mg/dL (ref 6–23)
CO2: 26 mmol/L (ref 19–32)
Calcium: 9.5 mg/dL (ref 8.4–10.5)
Chloride: 99 mmol/L (ref 96–112)
Creatinine, Ser: 0.68 mg/dL (ref 0.50–1.10)
GFR calc Af Amer: >90 mL/min
GFR calc non Af Amer: >90 mL/min
Glucose, Bld: 344 mg/dL — ABNORMAL HIGH (ref 70–99)
Potassium: 3.8 mmol/L (ref 3.5–5.1)
Sodium: 136 mmol/L (ref 135–145)
Total Bilirubin: 0.4 mg/dL (ref 0.3–1.2)
Total Protein: 7.2 g/dL (ref 6.0–8.3)

## 2014-12-28 LAB — CBG MONITORING, ED
Glucose-Capillary: 353 mg/dL — ABNORMAL HIGH (ref 70–99)
Glucose-Capillary: 323 mg/dL — ABNORMAL HIGH (ref 70–99)
Glucose-Capillary: 246 mg/dL — ABNORMAL HIGH (ref 70–99)

## 2014-12-28 MED ORDER — GLIPIZIDE 5 MG PO TABS: 10.0000 mg | ORAL_TABLET | Freq: Every day | ORAL | Status: DC

## 2014-12-28 MED ORDER — TRAMADOL HCL 50 MG PO TABS
50.0000 mg | ORAL_TABLET | Freq: Once | ORAL | Status: AC
  Administered 2014-12-28: 50 mg via ORAL
  Filled 2014-12-28: qty 1

## 2014-12-28 MED ORDER — INSULIN ASPART 100 UNIT/ML ~~LOC~~ SOLN
12.0000 [IU] | Freq: Once | SUBCUTANEOUS | Status: AC
  Administered 2014-12-28: 12 [IU] via SUBCUTANEOUS
  Filled 2014-12-28: qty 1

## 2014-12-28 MED ORDER — SODIUM CHLORIDE 0.9 % IV BOLUS (SEPSIS)
1000.0000 mL | Freq: Once | INTRAVENOUS | Status: AC
  Administered 2014-12-28: 1000 mL via INTRAVENOUS

## 2014-12-28 MED ORDER — TRAMADOL HCL 50 MG PO TABS: 50.0000 mg | ORAL_TABLET | Freq: Four times a day (QID) | ORAL | Status: DC | PRN

## 2014-12-28 NOTE — ED Notes (Signed)
Pt reports hyperglycemia and chronic foot pain related to neuropathy and arthritis to feet bilaterally.

## 2014-12-28 NOTE — Discharge Instructions (Signed)
It was our pleasure to provide your ER care today - we hope that you feel better.  Drink adequate water.   Follow diabetic diet.  Continue metformin.  Also take glipizide as prescribed.  It is important that you eat meals regularly, and do not delay or skip meals when taking diabetic medication.  Check blood sugars 4x/day and record values.   Follow up with primary care doctor this Monday for recheck, and to discuss medication plan for diabetes management.   Also follow up with diabetes management program.  For arthritis pain, you may also take ultram as need for pain - no driving when taking.  Follow up with your rheumatologist in coming week.  Return to ER if worse, new symptoms, fevers, persistent vomiting, weak/fainting, other concern.      Hyperglycemia Hyperglycemia occurs when the glucose (sugar) in your blood is too high. Hyperglycemia can happen for many reasons, but it most often happens to people who do not know they have diabetes or are not managing their diabetes properly.  CAUSES  Whether you have diabetes or not, there are other causes of hyperglycemia. Hyperglycemia can occur when you have diabetes, but it can also occur in other situations that you might not be as aware of, such as: Diabetes  If you have diabetes and are having problems controlling your blood glucose, hyperglycemia could occur because of some of the following reasons:  Not following your meal plan.  Not taking your diabetes medications or not taking it properly.  Exercising less or doing less activity than you normally do.  Being sick. Pre-diabetes  This cannot be ignored. Before people develop Type 2 diabetes, they almost always have "pre-diabetes." This is when your blood glucose levels are higher than normal, but not yet high enough to be diagnosed as diabetes. Research has shown that some long-term damage to the body, especially the heart and circulatory system, may already be occurring  during pre-diabetes. If you take action to manage your blood glucose when you have pre-diabetes, you may delay or prevent Type 2 diabetes from developing. Stress  If you have diabetes, you may be "diet" controlled or on oral medications or insulin to control your diabetes. However, you may find that your blood glucose is higher than usual in the hospital whether you have diabetes or not. This is often referred to as "stress hyperglycemia." Stress can elevate your blood glucose. This happens because of hormones put out by the body during times of stress. If stress has been the cause of your high blood glucose, it can be followed regularly by your caregiver. That way he/she can make sure your hyperglycemia does not continue to get worse or progress to diabetes. Steroids  Steroids are medications that act on the infection fighting system (immune system) to block inflammation or infection. One side effect can be a rise in blood glucose. Most people can produce enough extra insulin to allow for this rise, but for those who cannot, steroids make blood glucose levels go even higher. It is not unusual for steroid treatments to "uncover" diabetes that is developing. It is not always possible to determine if the hyperglycemia will go away after the steroids are stopped. A special blood test called an A1c is sometimes done to determine if your blood glucose was elevated before the steroids were started. SYMPTOMS  Thirsty.  Frequent urination.  Dry mouth.  Blurred vision.  Tired or fatigue.  Weakness.  Sleepy.  Tingling in feet or leg. DIAGNOSIS  Diagnosis is made by monitoring blood glucose in one or all of the following ways:  A1c test. This is a chemical found in your blood.  Fingerstick blood glucose monitoring.  Laboratory results. TREATMENT  First, knowing the cause of the hyperglycemia is important before the hyperglycemia can be treated. Treatment may include, but is not be limited  to:  Education.  Change or adjustment in medications.  Change or adjustment in meal plan.  Treatment for an illness, infection, etc.  More frequent blood glucose monitoring.  Change in exercise plan.  Decreasing or stopping steroids.  Lifestyle changes. HOME CARE INSTRUCTIONS   Test your blood glucose as directed.  Exercise regularly. Your caregiver will give you instructions about exercise. Pre-diabetes or diabetes which comes on with stress is helped by exercising.  Eat wholesome, balanced meals. Eat often and at regular, fixed times. Your caregiver or nutritionist will give you a meal plan to guide your sugar intake.  Being at an ideal weight is important. If needed, losing as little as 10 to 15 pounds may help improve blood glucose levels. SEEK MEDICAL CARE IF:   You have questions about medicine, activity, or diet.  You continue to have symptoms (problems such as increased thirst, urination, or weight gain). SEEK IMMEDIATE MEDICAL CARE IF:   You are vomiting or have diarrhea.  Your breath smells fruity.  You are breathing faster or slower.  You are very sleepy or incoherent.  You have numbness, tingling, or pain in your feet or hands.  You have chest pain.  Your symptoms get worse even though you have been following your caregiver's orders.  If you have any other questions or concerns. Document Released: 04/20/2001 Document Revised: 01/17/2012 Document Reviewed: 02/21/2012 [facility] Patient Information 2015 [place], [place]. This information is not intended to replace advice given to you by your health care provider. Make sure you discuss any questions you have with your health care provider.     Type 2 Diabetes Mellitus Type 2 diabetes mellitus, often simply referred to as type 2 diabetes, is a long-lasting (chronic) disease. In type 2 diabetes, the pancreas does not make enough insulin (a hormone), the cells are less responsive to the insulin that is made  (insulin resistance), or both. Normally, insulin moves sugars from food into the tissue cells. The tissue cells use the sugars for energy. The lack of insulin or the lack of normal response to insulin causes excess sugars to build up in the blood instead of going into the tissue cells. As a result, high blood sugar (hyperglycemia) develops. The effect of high sugar (glucose) levels can cause many complications. Type 2 diabetes was also previously called adult-onset diabetes, but it can occur at any age.  RISK FACTORS  A person is predisposed to developing type 2 diabetes if someone in the family has the disease and also has one or more of the following primary risk factors:  Overweight.  An inactive lifestyle.  A history of consistently eating high-calorie foods. Maintaining a normal weight and regular physical activity can reduce the chance of developing type 2 diabetes. SYMPTOMS  A person with type 2 diabetes may not show symptoms initially. The symptoms of type 2 diabetes appear slowly. The symptoms include:  Increased thirst (polydipsia).  Increased urination (polyuria).  Increased urination during the night (nocturia).  Weight loss. This weight loss may be rapid.  Frequent, recurring infections.  Tiredness (fatigue).  Weakness.  Vision changes, such as blurred vision.  Fruity smell to your  breath.  Abdominal pain.  Nausea or vomiting.  Cuts or bruises which are slow to heal.  Tingling or numbness in the hands or feet. DIAGNOSIS Type 2 diabetes is frequently not diagnosed until complications of diabetes are present. Type 2 diabetes is diagnosed when symptoms or complications are present and when blood glucose levels are increased. Your blood glucose level may be checked by one or more of the following blood tests:  A fasting blood glucose test. You will not be allowed to eat for at least 8 hours before a blood sample is taken.  A random blood glucose test. Your  blood glucose is checked at any time of the day regardless of when you ate.  A hemoglobin A1c blood glucose test. A hemoglobin A1c test provides information about blood glucose control over the previous 3 months.  An oral glucose tolerance test (OGTT). Your blood glucose is measured after you have not eaten (fasted) for 2 hours and then after you drink a glucose-containing beverage. TREATMENT   You may need to take insulin or diabetes medicine daily to keep blood glucose levels in the desired range.  If you use insulin, you may need to adjust the dosage depending on the carbohydrates that you eat with each meal or snack. The treatment goal is to maintain the before meal blood sugar (preprandial glucose) level at 70-130 mg/dL. HOME CARE INSTRUCTIONS   Have your hemoglobin A1c level checked twice a year.  Perform daily blood glucose monitoring as directed by your health care provider.  Monitor urine ketones when you are ill and as directed by your health care provider.  Take your diabetes medicine or insulin as directed by your health care provider to maintain your blood glucose levels in the desired range.  Never run out of diabetes medicine or insulin. It is needed every day.  If you are using insulin, you may need to adjust the amount of insulin given based on your intake of carbohydrates. Carbohydrates can raise blood glucose levels but need to be included in your diet. Carbohydrates provide vitamins, minerals, and fiber which are an essential part of a healthy diet. Carbohydrates are found in fruits, vegetables, whole grains, dairy products, legumes, and foods containing added sugars.  Eat healthy foods. You should make an appointment to see a registered dietitian to help you create an eating plan that is right for you.  Lose weight if you are overweight.  Carry a medical alert card or wear your medical alert jewelry.  Carry a 15-gram carbohydrate snack with you at all times to  treat low blood glucose (hypoglycemia). Some examples of 15-gram carbohydrate snacks include:  Glucose tablets, 3 or 4.  Glucose gel, 15-gram tube.  Raisins, 2 tablespoons (24 grams).  Jelly beans, 6.  Animal crackers, 8.  Regular pop, 4 ounces (120 mL).  Gummy treats, 9.  Recognize hypoglycemia. Hypoglycemia occurs with blood glucose levels of 70 mg/dL and below. The risk for hypoglycemia increases when fasting or skipping meals, during or after intense exercise, and during sleep. Hypoglycemia symptoms can include:  Tremors or shakes.  Decreased ability to concentrate.  Sweating.  Increased heart rate.  Headache.  Dry mouth.  Hunger.  Irritability.  Anxiety.  Restless sleep.  Altered speech or coordination.  Confusion.  Treat hypoglycemia promptly. If you are alert and able to safely swallow, follow the 15:15 rule:  Take 15-20 grams of rapid-acting glucose or carbohydrate. Rapid-acting options include glucose gel, glucose tablets, or 4 ounces (120 mL) of  fruit juice, regular soda, or low-fat milk.  Check your blood glucose level 15 minutes after taking the glucose.  Take 15-20 grams more of glucose if the repeat blood glucose level is still 70 mg/dL or below.  Eat a meal or snack within 1 hour once blood glucose levels return to normal.  Be alert to feeling very thirsty and urinating more frequently than usual, which are early signs of hyperglycemia. An early awareness of hyperglycemia allows for prompt treatment. Treat hyperglycemia as directed by your health care provider.  Engage in at least 150 minutes of moderate-intensity physical activity a week, spread over at least 3 days of the week or as directed by your health care provider. In addition, you should engage in resistance exercise at least 2 times a week or as directed by your health care provider. Try to spend no more than 90 minutes at one time inactive.  Adjust your medicine and food intake as  needed if you start a new exercise or sport.  Follow your sick-day plan anytime you are unable to eat or drink as usual.  Do not use any tobacco products including cigarettes, chewing tobacco, or electronic cigarettes. If you need help quitting, ask your health care provider.  Limit alcohol intake to no more than 1 drink per day for nonpregnant women and 2 drinks per day for men. You should drink alcohol only when you are also eating food. Talk with your health care provider whether alcohol is safe for you. Tell your health care provider if you drink alcohol several times a week.  Keep all follow-up visits as directed by your health care provider. This is important.  Schedule an eye exam soon after the diagnosis of type 2 diabetes and then annually.  Perform daily skin and foot care. Examine your skin and feet daily for cuts, bruises, redness, nail problems, bleeding, blisters, or sores. A foot exam by a health care provider should be done annually.  Brush your teeth and gums at least twice a day and floss at least once a day. Follow up with your dentist regularly.  Share your diabetes management plan with your workplace or school.  Stay up-to-date with immunizations. It is recommended that people with diabetes who are over 31 years old get the pneumonia vaccine. In some cases, two separate shots may be given. Ask your health care provider if your pneumonia vaccination is up-to-date.  Learn to manage stress.  Obtain ongoing diabetes education and support as needed.  Participate in or seek rehabilitation as needed to maintain or improve independence and quality of life. Request a physical or occupational therapy referral if you are having foot or hand numbness, or difficulties with grooming, dressing, eating, or physical activity. SEEK MEDICAL CARE IF:   You are unable to eat food or drink fluids for more than 6 hours.  You have nausea and vomiting for more than 6 hours.  Your blood  glucose level is over 240 mg/dL.  There is a change in mental status.  You develop an additional serious illness.  You have diarrhea for more than 6 hours.  You have been sick or have had a fever for a couple of days and are not getting better.  You have pain during any physical activity.  SEEK IMMEDIATE MEDICAL CARE IF:  You have difficulty breathing.  You have moderate to large ketone levels. MAKE SURE YOU:  Understand these instructions.  Will watch your condition.  Will get help right away if you  are not doing well or get worse. Document Released: 10/25/2005 Document Revised: 03/11/2014 Document Reviewed: 05/23/2012 [facility] Patient Information 2015 [place], [place]. This information is not intended to replace advice given to you by your health care provider. Make sure you discuss any questions you have with your health care provider.     Diabetes Mellitus and Food It is important for you to manage your blood sugar (glucose) level. Your blood glucose level can be greatly affected by what you eat. Eating healthier foods in the appropriate amounts throughout the day at about the same time each day will help you control your blood glucose level. It can also help slow or prevent worsening of your diabetes mellitus. Healthy eating may even help you improve the level of your blood pressure and reach or maintain a healthy weight.  HOW CAN FOOD AFFECT ME? Carbohydrates Carbohydrates affect your blood glucose level more than any other type of food. Your dietitian will help you determine how many carbohydrates to eat at each meal and teach you how to count carbohydrates. Counting carbohydrates is important to keep your blood glucose at a healthy level, especially if you are using insulin or taking certain medicines for diabetes mellitus. Alcohol Alcohol can cause sudden decreases in blood glucose (hypoglycemia), especially if you use insulin or take certain medicines for diabetes  mellitus. Hypoglycemia can be a life-threatening condition. Symptoms of hypoglycemia (sleepiness, dizziness, and disorientation) are similar to symptoms of having too much alcohol.  If your health care provider has given you approval to drink alcohol, do so in moderation and use the following guidelines:  Women should not have more than one drink per day, and men should not have more than two drinks per day. One drink is equal to:  12 oz of beer.  5 oz of wine.  1 oz of hard liquor.  Do not drink on an empty stomach.  Keep yourself hydrated. Have water, diet soda, or unsweetened iced tea.  Regular soda, juice, and other mixers might contain a lot of carbohydrates and should be counted. WHAT FOODS ARE NOT RECOMMENDED? As you make food choices, it is important to remember that all foods are not the same. Some foods have fewer nutrients per serving than other foods, even though they might have the same number of calories or carbohydrates. It is difficult to get your body what it needs when you eat foods with fewer nutrients. Examples of foods that you should avoid that are high in calories and carbohydrates but low in nutrients include:  Trans fats (most processed foods list trans fats on the Nutrition Facts label).  Regular soda.  Juice.  Candy.  Sweets, such as cake, pie, doughnuts, and cookies.  Fried foods. WHAT FOODS CAN I EAT? Have nutrient-rich foods, which will nourish your body and keep you healthy. The food you should eat also will depend on several factors, including:  The calories you need.  The medicines you take.  Your weight.  Your blood glucose level.  Your blood pressure level.  Your cholesterol level. You also should eat a variety of foods, including:  Protein, such as meat, poultry, fish, tofu, nuts, and seeds (lean animal proteins are best).  Fruits.  Vegetables.  Dairy products, such as milk, cheese, and yogurt (low fat is best).  Breads,  grains, pasta, cereal, rice, and beans.  Fats such as olive oil, trans fat-free margarine, canola oil, avocado, and olives. DOES EVERYONE WITH DIABETES MELLITUS HAVE THE SAME MEAL PLAN? Because every  person with diabetes mellitus is different, there is not one meal plan that works for everyone. It is very important that you meet with a dietitian who will help you create a meal plan that is just right for you. Document Released: 07/22/2005 Document Revised: 10/30/2013 Document Reviewed: 09/21/2013 [facility] Patient Information 2015 [place], [place]. This information is not intended to replace advice given to you by your health care provider. Make sure you discuss any questions you have with your health care provider.    Rheumatoid Arthritis Rheumatoid arthritis is a long-term (chronic) inflammatory disease that causes pain, swelling, and stiffness of the joints. It can affect the entire body, including the eyes and lungs. The effects of rheumatoid arthritis vary widely among those with the condition. CAUSES  The cause of rheumatoid arthritis is not known. It tends to run in families and is more common in women. Certain cells of the body's natural defense system (immune system) do not work properly and begin to attack healthy joints. It primarily involves the connective tissue that lines the joints (synovial membrane). This can cause damage to the joint. SYMPTOMS   Pain, stiffness, swelling, and decreased motion of many joints, especially in the hands and feet.  Stiffness that is worse in the morning. It may last 1-2 hours or longer.  Numbness and tingling in the hands.  Fatigue.  Loss of appetite.  Weight loss.  Low-grade fever.  Dry eyes and mouth.  Firm lumps (rheumatoid nodules) that grow beneath the skin in areas such as the elbows and hands. DIAGNOSIS  Diagnosis is based on the symptoms described, an exam, and blood tests. Sometimes, X-rays are helpful. TREATMENT  The goals of  treatment are to relieve pain, reduce inflammation, and to slow down or stop joint damage and disability. Methods vary and may include:  Maintaining a balance of rest, exercise, and proper nutrition.  Medicines:  Pain relievers (analgesics).  Corticosteroids and nonsteroidal anti-inflammatory drugs (NSAIDs) to reduce inflammation.  Disease-modifying antirheumatic drugs (DMARDs) to try to slow the course of the disease.  Biologic response modifiers to reduce inflammation and damage.  Physical therapy and occupational therapy.  Surgery for patients with severe joint damage. Joint replacement or fusing of joints may be needed.  Routine monitoring and ongoing care, such as office visits, blood and urine tests, and X-rays. HOME CARE INSTRUCTIONS   Remain physically active and reduce activity when the disease gets worse.  Eat a well-balanced diet.  Put heat on affected joints when you wake up and before activities. Keep the heat on the affected joint for as long as directed by your health care provider.  Put ice on affected joints following activities or exercising.  Put ice in a plastic bag.  Place a towel between your skin and the bag.  Leave the ice on for 15-20 minutes, 3-4 times per day, or as directed by your health care provider.  Take medicines and supplements only as directed by your health care provider.  Use splints as directed by your health care provider. Splints help maintain joint position and function.  Do not sleep with pillows under your knees. This may lead to spasms.  Participate in a self-management program to keep current with the latest treatment and coping skills. SEEK IMMEDIATE MEDICAL CARE IF:  You have fainting episodes.  You have periods of extreme weakness.  You rapidly develop a hot, painful joint that is more severe than usual joint aches.  You have chills.  You have a fever. FOR  MORE INFORMATION   [employer], broadcasting/film/video of Rheumatology:  [email or web address]: [email or web address].org Document Released: 10/22/2000 Document Revised: 03/11/2014 Document Reviewed: 12/01/2011 [employer] Patient Information 2015 [place], [place]. This information is not intended to replace advice given to you by your health care provider. Make sure you discuss any questions you have with your health care provider.

## 2014-12-28 NOTE — ED Notes (Signed)
Pt from home c/o hyperglycemia. She reports her CBG was 590 yesterday. She reports taking her Metformin this am CBG now 353. She says she is prednisone and methotrexate for arthritis.

## 2014-12-28 NOTE — ED Notes (Signed)
Attempt to start IV unsuccessful. Catalina [place] Quick reports will attempt [place] IV.

## 2014-12-28 NOTE — ED Notes (Signed)
Per Colin Rhein give reorder another bolus of fluids and then recheck CBG prior to discharge.

## 2014-12-28 NOTE — ED Provider Notes (Addendum)
CSN: [id number]     Arrival date & time 12/28/14  1244 History   First MD Initiated Contact with Patient 12/28/14 1321     Chief Complaint  Patient presents with  . Hyperglycemia     (Consider location/radiation/quality/duration/timing/severity/associated sxs/prior Treatment) Patient is a 54 y.o. female presenting with hyperglycemia. The history is provided by the patient.  Hyperglycemia Associated symptoms: no abdominal pain, no chest pain, no confusion, no dysuria, no fever, no polyuria, no shortness of breath and no vomiting   pt with hx niddm, presents indicating yesterday blood sugars read high in 500 range. Pt denies polyuria or polydipsia. No nv. No fever or chills. Pt states compliant w normal diabetic medication (metformin). Denies following any specific diabetic diet, and has gained 5-10 lbs in past several months. Pt indicates is on chronic methotrexate and prednisone for rheumatic arthritis - denies any recent change in these or other meds. No cough or uri c/o. No abd pain.  No nvd. No gu c/o.  Pt does indicate all of her joints are sore, including bil feet, ankles, hands and wrist. Denies any joint swelling or redness.      Past Medical History  Diagnosis Date  . Atrial fibrillation, persistent   . CHF (congestive heart failure) 2013    No echo found from that time, most likely diastolic  . Diabetes mellitus without complication   . Hypertension   . Depression   . Anxiety   . Rheumatoid arthritis    Past Surgical History  Procedure Laterality Date  . Cesarean section      x2  . Left breast cyst    . Laparotomy Right 02/27/2013    Procedure: EXPLORATORY LAPAROTOMY, right salpingo-oopherectomy;  Surgeon: [name], MD;  Location: [facility];  Service: Gynecology;  Laterality: Right;  . Salpingoophorectomy Right 02/27/2013    Procedure: SALPINGO OOPHORECTOMY;  Surgeon: [name], MD;  Location: [facility];  Service: Gynecology;  Laterality: Right;   No family history  on file. History  Substance Use Topics  . Smoking status: Former Smoker -- 0.75 packs/day for 24 years    Types: Cigarettes    Quit date: 06/08/2014  . Smokeless tobacco: Never Used     Comment: smoking cessation info given.   . Alcohol Use: No   OB History    No data available     Review of Systems  Constitutional: Negative for fever and chills.  HENT: Negative for sore throat.   Eyes: Negative for redness.  Respiratory: Negative for cough and shortness of breath.   Cardiovascular: Negative for chest pain.  Gastrointestinal: Negative for vomiting, abdominal pain and diarrhea.  Endocrine: Negative for polyuria.  Genitourinary: Negative for dysuria and flank pain.  Musculoskeletal: Positive for arthralgias. Negative for back pain and neck pain.  Skin: Negative for rash.  Neurological: Negative for syncope and headaches.  Hematological: Does not bruise/bleed easily.  Psychiatric/Behavioral: Negative for confusion.      Allergies  Ace inhibitors and Lisinopril  Home Medications   Prior to Admission medications   Medication Sig Start Date End Date Taking? Authorizing Provider  folic acid (FOLVITE) 1 MG tablet Take 1 mg by mouth every morning.    Yes Historical Provider, MD  ibuprofen (ADVIL,MOTRIN) 200 MG tablet Take 800 mg by mouth every 6 (six) hours as needed for moderate pain.   Yes Historical Provider, MD  losartan (COZAAR) 25 MG tablet Take 25 mg by mouth every morning.    Yes Historical Provider, MD  metFORMIN (  GLUCOPHAGE) 500 MG tablet Take 500 mg by mouth 2 (two) times daily.    Yes Historical Provider, MD  methotrexate (RHEUMATREX) 2.5 MG tablet Take 10 mg by mouth every Monday. Caution:Chemotherapy. Protect from light.   Yes Historical Provider, MD  metoprolol tartrate (LOPRESSOR) 25 MG tablet Take 0.5 tablets (12.5 mg total) by mouth 2 (two) times daily. 10/01/13  Yes Brett Canales, PA-C  predniSONE (DELTASONE) 10 MG tablet Take 10 mg by mouth daily with  breakfast.   Yes Historical Provider, MD  rivaroxaban (XARELTO) 20 MG TABS tablet Take 1 tablet (20 mg total) by mouth daily with supper. 12/18/14  Yes Rhonda G Barrett, PA-C  furosemide (LASIX) 20 MG tablet Take 1 tablet (20 mg total) by mouth daily. Take 20 mg twice daily by mouth for four days (12/18/2013 to 12/21/2014), then take 20 mg by mouth daily Patient not taking: Reported on 12/28/2014 12/18/14   [name], PA-C  potassium chloride SA (KLOR-CON M20) 20 MEQ tablet Take 20 mg daily for four days Patient not taking: Reported on 12/28/2014 12/18/14   [name], PA-C   BP 132/73 mmHg  Pulse 91  Temp(Src) 98 F (36.7 C) (Oral)  Resp 20  SpO2 95%  LMP 10/28/2013 Physical Exam  Constitutional: She is oriented to person, place, and time. She appears well-developed and well-nourished. No distress.  HENT:  Mouth/Throat: Oropharynx is clear and moist.  Eyes: Conjunctivae are normal. No scleral icterus.  Neck: Neck supple. No tracheal deviation present. No thyromegaly present.  Cardiovascular: Normal rate, regular rhythm, normal heart sounds and intact distal pulses.   Pulmonary/Chest: Effort normal and breath sounds normal. No respiratory distress.  Abdominal: Soft. Normal appearance and bowel sounds are normal. She exhibits no distension and no mass. There is no tenderness. There is no rebound and no guarding.  obese  Genitourinary:  No cva tenderness  Musculoskeletal: She exhibits no edema.  Chronic mild joint deformity bil hands, associated w RA. No focal joint erythema, warmth or swelling.   Neurological: She is alert and oriented to person, place, and time.  Skin: Skin is warm and dry. No rash noted. She is not diaphoretic.  Psychiatric: She has a normal mood and affect.  Nursing note and vitals reviewed.   ED Course  Procedures (including critical care time) Labs Review  Results for orders placed or performed during the hospital encounter of 12/28/14  CBC  Result  Value Ref Range   WBC 8.1 4.0 - 10.5 K/uL   RBC 4.00 3.87 - 5.11 MIL/uL   Hemoglobin 11.1 (L) 12.0 - 15.0 g/dL   HCT 34.7 (L) 36.0 - 46.0 %   MCV 86.8 78.0 - 100.0 fL   MCH 27.8 26.0 - 34.0 pg   MCHC 32.0 30.0 - 36.0 g/dL   RDW 13.1 11.5 - 15.5 %   Platelets 391 150 - 400 K/uL  Comprehensive metabolic panel  Result Value Ref Range   Sodium 136 135 - 145 mmol/L   Potassium 3.8 3.5 - 5.1 mmol/L   Chloride 99 96 - 112 mmol/L   CO2 26 19 - 32 mmol/L   Glucose, Bld 344 (H) 70 - 99 mg/dL   BUN 17 6 - 23 mg/dL   Creatinine, Ser 0.68 0.50 - 1.10 mg/dL   Calcium 9.5 8.4 - 10.5 mg/dL   Total Protein 7.2 6.0 - 8.3 g/dL   Albumin 3.7 3.5 - 5.2 g/dL   AST 17 0 - 37 U/L   ALT  16 0 - 35 U/L   Alkaline Phosphatase 68 39 - 117 U/L   Total Bilirubin 0.4 0.3 - 1.2 mg/dL   GFR calc non Af Amer >90 >90 mL/min   GFR calc Af Amer >90 >90 mL/min   Anion gap 11 5 - 15  CBG monitoring, ED  Result Value Ref Range   Glucose-Capillary 353 (H) 70 - 99 mg/dL   Dg Chest 2 View  12/17/2014   CLINICAL DATA:  Chest congestion.  Leg swelling.  EXAM: CHEST  2 VIEW  COMPARISON:  02/22/2013  FINDINGS: There is new slight cardiomegaly. Pulmonary vascularity is normal. No infiltrates or effusions. No pulmonary edema. No significant osseous abnormality.  IMPRESSION: New borderline cardiomegaly.   Electronically Signed   By: Lorriane Shire M.D.   On: 12/17/2014 12:23       MDM   Iv ns bolus. novolog sq.  Reviewed nursing notes and prior charts for additional history.   Ultram for pain.  Recheck pt feels improved. No nv.  novolog sq.  Additional iv ns.  bs improved.   Po fluids.  Given range recent blood sugars, do not feel metformin alone will control.    Will add glipizide. rec bs monitoring 4x/day, record values, and close pcp follow up Monday for recheck and plan for diabetes management.  Will also refer to diab man program. rec diabetic diet.   abd soft nt.   Afeb.  Pt currently appears  stable for d/c.   At d/c pt requests rx for padded orthotic shoe - will provide.   Recheck pt eating and drinking, comfortable.      Mirna Mires, MD 12/28/14 [phone]

## 2014-12-29 ENCOUNTER — Encounter (HOSPITAL_COMMUNITY): Payer: Self-pay

## 2014-12-29 ENCOUNTER — Emergency Department (HOSPITAL_COMMUNITY)
Admission: EM | Admit: 2014-12-29 | Discharge: 2014-12-29 | Disposition: A | Discharge status: Home / Self Care | Payer: Medicaid Other | Attending: Emergency Medicine | Admitting: Emergency Medicine

## 2014-12-29 DIAGNOSIS — M069 Rheumatoid arthritis, unspecified: Secondary | ICD-10-CM | POA: Insufficient documentation

## 2014-12-29 DIAGNOSIS — E119 Type 2 diabetes mellitus without complications: Secondary | ICD-10-CM | POA: Insufficient documentation

## 2014-12-29 DIAGNOSIS — Z87891 Personal history of nicotine dependence: Secondary | ICD-10-CM | POA: Diagnosis not present

## 2014-12-29 DIAGNOSIS — I4891 Unspecified atrial fibrillation: Secondary | ICD-10-CM | POA: Insufficient documentation

## 2014-12-29 DIAGNOSIS — I10 Essential (primary) hypertension: Secondary | ICD-10-CM | POA: Diagnosis not present

## 2014-12-29 DIAGNOSIS — Z7952 Long term (current) use of systemic steroids: Secondary | ICD-10-CM | POA: Insufficient documentation

## 2014-12-29 DIAGNOSIS — F419 Anxiety disorder, unspecified: Secondary | ICD-10-CM | POA: Insufficient documentation

## 2014-12-29 DIAGNOSIS — I509 Heart failure, unspecified: Secondary | ICD-10-CM | POA: Insufficient documentation

## 2014-12-29 DIAGNOSIS — F329 Major depressive disorder, single episode, unspecified: Secondary | ICD-10-CM | POA: Insufficient documentation

## 2014-12-29 DIAGNOSIS — R52 Pain, unspecified: Secondary | ICD-10-CM | POA: Diagnosis present

## 2014-12-29 DIAGNOSIS — Z79899 Other long term (current) drug therapy: Secondary | ICD-10-CM | POA: Diagnosis not present

## 2014-12-29 LAB — CBG MONITORING, ED: Glucose-Capillary: 270 mg/dL — ABNORMAL HIGH (ref 70–99)

## 2014-12-29 MED ORDER — OXYCODONE-ACETAMINOPHEN 5-325 MG PO TABS: 2.0000 | ORAL_TABLET | ORAL | Status: DC | PRN

## 2014-12-29 MED ORDER — HYDROMORPHONE HCL 1 MG/ML IJ SOLN
2.0000 mg | Freq: Once | INTRAMUSCULAR | Status: AC
  Administered 2014-12-29: 2 mg via INTRAMUSCULAR
  Filled 2014-12-29: qty 2

## 2014-12-29 NOTE — ED Notes (Signed)
Pt sates she has arthritis.  Placed on tramadol and steroids.  Noticed when taking meds, heart races.  Also c/o pain uncontrolled

## 2014-12-29 NOTE — ED Notes (Signed)
md at bedside  Pt alert and oriented x4. Respirations even and unlabored, bilateral symmetrical rise and fall of chest. Skin warm and dry. In no acute distress. Denies needs.   

## 2014-12-29 NOTE — ED Provider Notes (Signed)
CSN: [id number]     Arrival date & time 12/29/14  1053 History   First MD Initiated Contact with Patient 12/29/14 1121     Chief Complaint  Patient presents with  . Generalized Body Aches  . Palpitations     (Consider location/radiation/quality/duration/timing/severity/associated sxs/prior Treatment) HPI Comments: Patient here complaining of worsening diffuse joint pain. Seen here yesterday and is been on tramadol for her known history of rheumatoid arthritis. She is also had issues with hyperglycemia and was treated here yesterday for this. States that she's been compliant with her medications for that. No fever or chills. No vomiting noted. Pain is characterized as sharp and worse with movement and better with rest. Has been on steroids before in the past but is not taking it currently.  Patient is a 54 y.o. female presenting with palpitations. The history is provided by the patient.  Palpitations   Past Medical History  Diagnosis Date  . Atrial fibrillation, persistent   . CHF (congestive heart failure) 2013    No echo found from that time, most likely diastolic  . Diabetes mellitus without complication   . Hypertension   . Depression   . Anxiety   . Rheumatoid arthritis    Past Surgical History  Procedure Laterality Date  . Cesarean section      x2  . Left breast cyst    . Laparotomy Right 02/27/2013    Procedure: EXPLORATORY LAPAROTOMY, right salpingo-oopherectomy;  Surgeon: [name], MD;  Location: [facility];  Service: Gynecology;  Laterality: Right;  . Salpingoophorectomy Right 02/27/2013    Procedure: SALPINGO OOPHORECTOMY;  Surgeon: [name], MD;  Location: [facility];  Service: Gynecology;  Laterality: Right;   History reviewed. No pertinent family history. History  Substance Use Topics  . Smoking status: Former Smoker -- 0.75 packs/day for 24 years    Types: Cigarettes    Quit date: 06/08/2014  . Smokeless tobacco: Never Used     Comment: smoking cessation  info given.   . Alcohol Use: No   OB History    No data available     Review of Systems  Cardiovascular: Positive for palpitations.  All other systems reviewed and are negative.     Allergies  Ace inhibitors and Lisinopril  Home Medications   Prior to Admission medications   Medication Sig Start Date End Date Taking? Authorizing Provider  folic acid (FOLVITE) 1 MG tablet Take 1 mg by mouth every morning.    Yes Historical Provider, MD  glipiZIDE (GLUCOTROL) 5 MG tablet Take 2 tablets (10 mg total) by mouth daily before breakfast. 12/28/14  Yes Mirna Mires, MD  ibuprofen (ADVIL,MOTRIN) 200 MG tablet Take 800 mg by mouth every 6 (six) hours as needed for moderate pain.   Yes Historical Provider, MD  losartan (COZAAR) 25 MG tablet Take 25 mg by mouth every morning.    Yes Historical Provider, MD  metFORMIN (GLUCOPHAGE) 500 MG tablet Take 500 mg by mouth 2 (two) times daily.    Yes Historical Provider, MD  metoprolol tartrate (LOPRESSOR) 25 MG tablet Take 0.5 tablets (12.5 mg total) by mouth 2 (two) times daily. 10/01/13  Yes Brett Canales, PA-C  predniSONE (DELTASONE) 10 MG tablet Take 10 mg by mouth daily with breakfast.   Yes Historical Provider, MD  rivaroxaban (XARELTO) 20 MG TABS tablet Take 1 tablet (20 mg total) by mouth daily with supper. 12/18/14  Yes Rhonda G Barrett, PA-C  traMADol (ULTRAM) 50 MG tablet Take 1 tablet (  50 mg total) by mouth every 6 (six) hours as needed. Patient taking differently: Take 50 mg by mouth every 6 (six) hours as needed for moderate pain.  12/28/14  Yes Mirna Mires, MD  furosemide (LASIX) 20 MG tablet Take 1 tablet (20 mg total) by mouth daily. Take 20 mg twice daily by mouth for four days (12/18/2013 to 12/21/2014), then take 20 mg by mouth daily Patient not taking: Reported on 12/28/2014 12/18/14   [name], PA-C  methotrexate (RHEUMATREX) 2.5 MG tablet Take 10 mg by mouth every Monday. Caution:Chemotherapy. Protect from light.     Historical Provider, MD  potassium chloride SA (KLOR-CON M20) 20 MEQ tablet Take 20 mg daily for four days Patient not taking: Reported on 12/28/2014 12/18/14   [name], PA-C   BP 136/86 mmHg  Pulse 104  Temp(Src) 98.5 F (36.9 C) (Oral)  Resp 20  SpO2 98%  LMP 10/28/2013 Physical Exam  Constitutional: She is oriented to person, place, and time. She appears well-developed and well-nourished.  Non-toxic appearance. No distress.  HENT:  Head: Normocephalic and atraumatic.  Eyes: Conjunctivae, EOM and lids are normal. Pupils are equal, round, and reactive to light.  Neck: Normal range of motion. Neck supple. No tracheal deviation present. No thyroid mass present.  Cardiovascular: Normal rate, regular rhythm and normal heart sounds.  Exam reveals no gallop.   No murmur heard. Pulmonary/Chest: Effort normal and breath sounds normal. No stridor. No respiratory distress. She has no decreased breath sounds. She has no wheezes. She has no rhonchi. She has no rales.  Abdominal: Soft. Normal appearance and bowel sounds are normal. She exhibits no distension. There is no tenderness. There is no rebound and no CVA tenderness.  Musculoskeletal: Normal range of motion. She exhibits no edema or tenderness.  No evidence of joint effusions. No erythema noted. Full range of motion at all joints.  Neurological: She is alert and oriented to person, place, and time. She has normal strength. No cranial nerve deficit or sensory deficit. GCS eye subscore is 4. GCS verbal subscore is 5. GCS motor subscore is 6.  Skin: Skin is warm and dry. No abrasion and no rash noted.  Psychiatric: She has a normal mood and affect. Her speech is normal and behavior is normal.  Nursing note and vitals reviewed.   ED Course  Procedures (including critical care time) Labs Review Labs Reviewed  CBG MONITORING, ED - Abnormal; Notable for the following:    Glucose-Capillary 270 (*)    All other components within normal  limits    Imaging Review No results found.   EKG Interpretation None      MDM   Final diagnoses:  None    Patient given hydromorphone for pain and feels better. Will be given prescription for Percocet and follow-up with her rheumatologist    Leota Jacobsen, MD 12/29/14 [phone]

## 2014-12-29 NOTE — Discharge Instructions (Signed)
Rheumatoid Arthritis °Rheumatoid arthritis is a long-term (chronic) inflammatory disease that causes pain, swelling, and stiffness of the joints. It can affect the entire body, including the eyes and lungs. The effects of rheumatoid arthritis vary widely among those with the condition. °CAUSES  °The cause of rheumatoid arthritis is not known. It tends to run in families and is more common in women. Certain cells of the body's natural defense system (immune system) do not work properly and begin to attack healthy joints. It primarily involves the connective tissue that lines the joints (synovial membrane). This can cause damage to the joint. °SYMPTOMS  °· Pain, stiffness, swelling, and decreased motion of many joints, especially in the hands and feet. °· Stiffness that is worse in the morning. It may last 1-2 hours or longer. °· Numbness and tingling in the hands. °· Fatigue. °· Loss of appetite. °· Weight loss. °· Low-grade fever. °· Dry eyes and mouth. °· Firm lumps (rheumatoid nodules) that grow beneath the skin in areas such as the elbows and hands. °DIAGNOSIS  °Diagnosis is based on the symptoms described, an exam, and blood tests. Sometimes, X-rays are helpful. °TREATMENT  °The goals of treatment are to relieve pain, reduce inflammation, and to slow down or stop joint damage and disability. Methods vary and may include: °· Maintaining a balance of rest, exercise, and proper nutrition. °· Medicines: °¨ Pain relievers (analgesics). °¨ Corticosteroids and nonsteroidal anti-inflammatory drugs (NSAIDs) to reduce inflammation. °¨ Disease-modifying antirheumatic drugs (DMARDs) to try to slow the course of the disease. °¨ Biologic response modifiers to reduce inflammation and damage. °· Physical therapy and occupational therapy. °· Surgery for patients with severe joint damage. Joint replacement or fusing of joints may be needed. °· Routine monitoring and ongoing care, such as office visits, blood and urine tests, and  X-rays. °HOME CARE INSTRUCTIONS  °· Remain physically active and reduce activity when the disease gets worse. °· Eat a well-balanced diet. °· Put heat on affected joints when you wake up and before activities. Keep the heat on the affected joint for as long as directed by your health care provider. °· Put ice on affected joints following activities or exercising. °¨ Put ice in a plastic bag. °¨ Place a towel between your skin and the bag. °¨ Leave the ice on for 15-20 minutes, 3-4 times per day, or as directed by your health care provider. °· Take medicines and supplements only as directed by your health care provider. °· Use splints as directed by your health care provider. Splints help maintain joint position and function. °· Do not sleep with pillows under your knees. This may lead to spasms. °· Participate in a self-management program to keep current with the latest treatment and coping skills. °SEEK IMMEDIATE MEDICAL CARE IF: °· You have fainting episodes. °· You have periods of extreme weakness. °· You rapidly develop a hot, painful joint that is more severe than usual joint aches. °· You have chills. °· You have a fever. °FOR MORE INFORMATION  °· American College of Rheumatology: www.rheumatology.org °· [employer]: www.arthritis.org °Document Released: 10/22/2000 Document Revised: 03/11/2014 Document Reviewed: 12/01/2011 °[employer]® Patient Information ©2015 [employer]. This information is not intended to replace advice given to you by your health care provider. Make sure you discuss any questions you have with your health care provider. ° °

## 2014-12-29 NOTE — ED Notes (Signed)
Pt escorted to discharge window. Pt verbalized understanding discharge instructions. In no acute distress.  

## 2015-01-13 ENCOUNTER — Emergency Department (HOSPITAL_COMMUNITY)
Admission: EM | Admit: 2015-01-13 | Discharge: 2015-01-13 | Disposition: A | Discharge status: Home / Self Care | Payer: Medicaid Other | Attending: Emergency Medicine | Admitting: Emergency Medicine

## 2015-01-13 ENCOUNTER — Encounter (HOSPITAL_COMMUNITY): Payer: Self-pay | Admitting: *Deleted

## 2015-01-13 DIAGNOSIS — Z7952 Long term (current) use of systemic steroids: Secondary | ICD-10-CM | POA: Diagnosis not present

## 2015-01-13 DIAGNOSIS — F329 Major depressive disorder, single episode, unspecified: Secondary | ICD-10-CM | POA: Insufficient documentation

## 2015-01-13 DIAGNOSIS — Z7901 Long term (current) use of anticoagulants: Secondary | ICD-10-CM | POA: Insufficient documentation

## 2015-01-13 DIAGNOSIS — E119 Type 2 diabetes mellitus without complications: Secondary | ICD-10-CM | POA: Insufficient documentation

## 2015-01-13 DIAGNOSIS — I10 Essential (primary) hypertension: Secondary | ICD-10-CM | POA: Insufficient documentation

## 2015-01-13 DIAGNOSIS — Z87891 Personal history of nicotine dependence: Secondary | ICD-10-CM | POA: Insufficient documentation

## 2015-01-13 DIAGNOSIS — Z79899 Other long term (current) drug therapy: Secondary | ICD-10-CM | POA: Insufficient documentation

## 2015-01-13 DIAGNOSIS — I481 Persistent atrial fibrillation: Secondary | ICD-10-CM | POA: Diagnosis not present

## 2015-01-13 DIAGNOSIS — M069 Rheumatoid arthritis, unspecified: Secondary | ICD-10-CM | POA: Insufficient documentation

## 2015-01-13 DIAGNOSIS — I509 Heart failure, unspecified: Secondary | ICD-10-CM | POA: Insufficient documentation

## 2015-01-13 DIAGNOSIS — M2550 Pain in unspecified joint: Secondary | ICD-10-CM | POA: Diagnosis present

## 2015-01-13 DIAGNOSIS — F419 Anxiety disorder, unspecified: Secondary | ICD-10-CM | POA: Insufficient documentation

## 2015-01-13 MED ORDER — OXYCODONE-ACETAMINOPHEN 5-325 MG PO TABS: 1.0000 | ORAL_TABLET | Freq: Four times a day (QID) | ORAL | Status: DC | PRN

## 2015-01-13 MED ORDER — PREDNISONE 5 MG PO TABS
15.0000 mg | ORAL_TABLET | Freq: Once | ORAL | Status: AC
  Administered 2015-01-13: 15 mg via ORAL
  Filled 2015-01-13: qty 1

## 2015-01-13 MED ORDER — PREDNISONE 10 MG PO TABS: ORAL_TABLET | ORAL | Status: DC

## 2015-01-13 MED ORDER — MORPHINE SULFATE 4 MG/ML IJ SOLN
4.0000 mg | Freq: Once | INTRAMUSCULAR | Status: AC
  Administered 2015-01-13: 4 mg via INTRAMUSCULAR
  Filled 2015-01-13: qty 1

## 2015-01-13 NOTE — ED Notes (Signed)
Per [facility] - pt from home w/ c/o bilat leg pain upon waking this morning. Pt w/ hx of rheumatoid arthritis and admits to being out of her prednisone x2 weeks, denies any mechanism of injury.

## 2015-01-13 NOTE — Discharge Instructions (Signed)
Use prednisone and percocet as directed for your rheumatoid arthritis. Use heat or ice as needed for pain. See your rheumatologist at your scheduled appointment. Return to the ER for changes or worsening symptoms.    Rheumatoid Arthritis Rheumatoid arthritis is a long-term (chronic) inflammatory disease that causes pain, swelling, and stiffness of the joints. It can affect the entire body, including the eyes and lungs. The effects of rheumatoid arthritis vary widely among those with the condition. CAUSES  The cause of rheumatoid arthritis is not known. It tends to run in families and is more common in women. Certain cells of the body's natural defense system (immune system) do not work properly and begin to attack healthy joints. It primarily involves the connective tissue that lines the joints (synovial membrane). This can cause damage to the joint. SYMPTOMS   Pain, stiffness, swelling, and decreased motion of many joints, especially in the hands and feet.  Stiffness that is worse in the morning. It may last 1-2 hours or longer.  Numbness and tingling in the hands.  Fatigue.  Loss of appetite.  Weight loss.  Low-grade fever.  Dry eyes and mouth.  Firm lumps (rheumatoid nodules) that grow beneath the skin in areas such as the elbows and hands. DIAGNOSIS  Diagnosis is based on the symptoms described, an exam, and blood tests. Sometimes, X-rays are helpful. TREATMENT  The goals of treatment are to relieve pain, reduce inflammation, and to slow down or stop joint damage and disability. Methods vary and may include:  Maintaining a balance of rest, exercise, and proper nutrition.  Medicines:  Pain relievers (analgesics).  Corticosteroids and nonsteroidal anti-inflammatory drugs (NSAIDs) to reduce inflammation.  Disease-modifying antirheumatic drugs (DMARDs) to try to slow the course of the disease.  Biologic response modifiers to reduce inflammation and damage.  Physical therapy  and occupational therapy.  Surgery for patients with severe joint damage. Joint replacement or fusing of joints may be needed.  Routine monitoring and ongoing care, such as office visits, blood and urine tests, and X-rays. HOME CARE INSTRUCTIONS   Remain physically active and reduce activity when the disease gets worse.  Eat a well-balanced diet.  Put heat on affected joints when you wake up and before activities. Keep the heat on the affected joint for as long as directed by your health care provider.  Put ice on affected joints following activities or exercising.  Put ice in a plastic bag.  Place a towel between your skin and the bag.  Leave the ice on for 15-20 minutes, 3-4 times per day, or as directed by your health care provider.  Take medicines and supplements only as directed by your health care provider.  Use splints as directed by your health care provider. Splints help maintain joint position and function.  Do not sleep with pillows under your knees. This may lead to spasms.  Participate in a self-management program to keep current with the latest treatment and coping skills. SEEK IMMEDIATE MEDICAL CARE IF:  You have fainting episodes.  You have periods of extreme weakness.  You rapidly develop a hot, painful joint that is more severe than usual joint aches.  You have chills.  You have a fever. FOR MORE INFORMATION   American College [employer] Rheumatology: www.rheumatology.[place]: www.arthritis.org Document Released: 10/22/2000 Document Revised: 03/11/2014 Document Reviewed: 12/01/2011 [facility] Patient Information 2015 [place], [place]. This information is not intended to replace advice given to you by your health care provider. Make sure you discuss any questions  you have with your health care provider.

## 2015-01-13 NOTE — ED Notes (Signed)
Bed: WA09 Expected date:  Expected time:  Means of arrival:  Comments: EMS unable to bear weight, hx RA

## 2015-01-13 NOTE — ED Provider Notes (Signed)
CSN: [id number]     Arrival date & time 01/13/15  0540 History   First MD Initiated Contact with Patient 01/13/15 [phone]     Chief Complaint  Patient presents with  . Joint Pain     (Consider location/radiation/quality/duration/timing/severity/associated sxs/prior Treatment) HPI Comments: Kathleen Kane is a 54 y.o. female with a PMHx of RA, afib, CHF, DM2, HTN, depression, and anxiety who presents to the ED with complaints of diffuse joint pain x3wks in all joints including ankles, knees, shoulders, and her tailbone. She states this is the same pain as her chronic RA. She has been out of prednisone x2 wks. Has appt with rheumatologist on 02/04/15. Pain is 10/10 constant stabbing nonradiating pain worse with movement/weight bearing and unrelieved with percocet, ibuprofen, and tramadol. Previously on 10mg  prednisone daily but hasn't had this in 2wks. Denies fevers, chills, CP, SOB, abd pain, N/V/D/C, hematuria, dysuria, myalgias, joint swelling/erythema/warmth, decreased ROM, numbness, weakness, paresthesias, or rashes. Denies known injury.   Patient is a 54 y.o. female presenting with knee pain. The history is provided by the patient. No language interpreter was used.  Knee Pain Location:  Knee Time since incident:  3 weeks Injury: no   Knee location:  L knee and R knee Pain details:    Quality:  Sharp   Radiates to:  Does not radiate   Severity:  Severe   Onset quality:  Gradual   Duration:  3 weeks   Timing:  Constant   Progression:  Unchanged Chronicity:  Chronic Dislocation: no   Prior injury to area:  No Relieved by:  Nothing Worsened by:  Activity Ineffective treatments:  NSAIDs (percocet, ibuprofen, and tramadol) Associated symptoms: back pain ("tail bone")   Associated symptoms: no decreased ROM, no fever, no muscle weakness, no neck pain, no numbness, no stiffness, no swelling and no tingling   Risk factors: known bone disorder (rheumatoid arthritis)     Past Medical  History  Diagnosis Date  . Atrial fibrillation, persistent   . CHF (congestive heart failure) 2013    No echo found from that time, most likely diastolic  . Diabetes mellitus without complication   . Hypertension   . Depression   . Anxiety   . Rheumatoid arthritis    Past Surgical History  Procedure Laterality Date  . Cesarean section      x2  . Left breast cyst    . Laparotomy Right 02/27/2013    Procedure: EXPLORATORY LAPAROTOMY, right salpingo-oopherectomy;  Surgeon: [name], MD;  Location: [facility];  Service: Gynecology;  Laterality: Right;  . Salpingoophorectomy Right 02/27/2013    Procedure: SALPINGO OOPHORECTOMY;  Surgeon: [name], MD;  Location: [facility];  Service: Gynecology;  Laterality: Right;   History reviewed. No pertinent family history. History  Substance Use Topics  . Smoking status: Former Smoker -- 0.75 packs/day for 24 years    Types: Cigarettes    Quit date: 06/08/2014  . Smokeless tobacco: Never Used     Comment: smoking cessation info given.   . Alcohol Use: No   OB History    No data available     Review of Systems  Constitutional: Negative for fever and chills.  Respiratory: Negative for shortness of breath.   Cardiovascular: Negative for chest pain.  Gastrointestinal: Negative for nausea, vomiting, abdominal pain, diarrhea and constipation.  Genitourinary: Negative for dysuria, hematuria, vaginal bleeding and vaginal discharge.  Musculoskeletal: Positive for back pain ("tail bone") and arthralgias (all major joints). Negative for myalgias, joint  swelling, stiffness, neck pain and neck stiffness.  Skin: Negative for color change.  Neurological: Negative for weakness and numbness.  Psychiatric/Behavioral: Negative for confusion.   10 Systems reviewed and are negative for acute change except as noted in the HPI.    Allergies  Ace inhibitors and Lisinopril  Home Medications   Prior to Admission medications   Medication Sig Start  Date End Date Taking? Authorizing Provider  folic acid (FOLVITE) 1 MG tablet Take 1 mg by mouth every morning.    Yes Historical Provider, MD  glipiZIDE (GLUCOTROL) 5 MG tablet Take 2 tablets (10 mg total) by mouth daily before breakfast. 12/28/14  Yes Mirna Mires, MD  ibuprofen (ADVIL,MOTRIN) 200 MG tablet Take 800 mg by mouth every 6 (six) hours as needed for moderate pain.   Yes Historical Provider, MD  losartan (COZAAR) 25 MG tablet Take 25 mg by mouth every morning.    Yes Historical Provider, MD  metFORMIN (GLUCOPHAGE) 500 MG tablet Take 500 mg by mouth 2 (two) times daily.    Yes Historical Provider, MD  metoprolol tartrate (LOPRESSOR) 25 MG tablet Take 0.5 tablets (12.5 mg total) by mouth 2 (two) times daily. 10/01/13  Yes Brett Canales, PA-C  oxyCODONE-acetaminophen (PERCOCET/ROXICET) 5-325 MG per tablet Take 2 tablets by mouth every 4 (four) hours as needed for severe pain. 12/29/14  Yes Leota Jacobsen, MD  predniSONE (DELTASONE) 10 MG tablet Take 10 mg by mouth daily with breakfast.   Yes Historical Provider, MD  rivaroxaban (XARELTO) 20 MG TABS tablet Take 1 tablet (20 mg total) by mouth daily with supper. 12/18/14  Yes Rhonda G Barrett, PA-C  furosemide (LASIX) 20 MG tablet Take 1 tablet (20 mg total) by mouth daily. Take 20 mg twice daily by mouth for four days (12/18/2013 to 12/21/2014), then take 20 mg by mouth daily Patient not taking: Reported on 12/28/2014 12/18/14   [name], PA-C  methotrexate (RHEUMATREX) 2.5 MG tablet Take 10 mg by mouth every Monday. Caution:Chemotherapy. Protect from light.    Historical Provider, MD  potassium chloride SA (KLOR-CON M20) 20 MEQ tablet Take 20 mg daily for four days Patient not taking: Reported on 12/28/2014 12/18/14   [name], PA-C  traMADol (ULTRAM) 50 MG tablet Take 1 tablet (50 mg total) by mouth every 6 (six) hours as needed. Patient taking differently: Take 50 mg by mouth every 6 (six) hours as needed for moderate pain.   12/28/14   Mirna Mires, MD   BP 139/71 mmHg  Pulse 83  Temp(Src) 98.2 F (36.8 C) (Oral)  Resp 18  Ht 5\' 2"  (1.575 m)  Wt 236 lb (107.049 kg)  BMI 43.15 kg/m2  SpO2 97%  LMP 10/28/2013 Physical Exam  Constitutional: She is oriented to person, place, and time. Vital signs are normal. She appears well-developed and well-nourished.  Non-toxic appearance. No distress.  Afebrile, nontoxic, NAD  HENT:  Head: Normocephalic and atraumatic.  Mouth/Throat: Oropharynx is clear and moist and mucous membranes are normal.  Eyes: Conjunctivae and EOM are normal. Right eye exhibits no discharge. Left eye exhibits no discharge.  Neck: Normal range of motion. Neck supple.  Cardiovascular: Normal rate, regular rhythm, normal heart sounds and intact distal pulses.  Exam reveals no gallop and no friction rub.   No murmur heard. Pulmonary/Chest: Effort normal and breath sounds normal. No respiratory distress. She has no decreased breath sounds. She has no wheezes. She has no rhonchi. She has no rales.  Abdominal: Soft. Normal appearance and bowel sounds are normal. She exhibits no distension. There is no tenderness. There is no rigidity, no rebound, no guarding, no tenderness at McBurney's point and negative Murphy's sign.  Musculoskeletal: Normal range of motion.  MAE x4 Strength 5/5 in all extremities Distal pulses intact Sensation grossly intact in all extremities No joint effusions, warmth, erythema, or deformities ROM intact in all joints  Neurological: She is alert and oriented to person, place, and time. She has normal strength. No sensory deficit.  Skin: Skin is warm, dry and intact. No rash noted.  Psychiatric: She has a normal mood and affect.  Nursing note and vitals reviewed.   ED Course  Procedures (including critical care time) Labs Review Labs Reviewed - No data to display  Imaging Review No results found.   EKG Interpretation None      MDM   Final diagnoses:  Joint  pain  Rheumatoid arthritis flare    54 y.o. female with pain related to her RA, off prednisone x2wks, has appt with rheumatologist in 3wks. No deformities or swelling/erythema noted to any joints, no warmth in any joints, adequate ROM intact. Will give prednisone and morphine here, then d/c home with prednisone and percocet with instructions to f/up with rheumatologist at her appt in 3wks. I explained the diagnosis and have given explicit precautions to return to the ER including for any other new or worsening symptoms. The patient understands and accepts the medical plan as it's been dictated and I have answered their questions. Discharge instructions concerning home care and prescriptions have been given. The patient is STABLE and is discharged to home in good condition.  BP 139/71 mmHg  Pulse 83  Temp(Src) 98.2 F (36.8 C) (Oral)  Resp 18  Ht 5\' 2"  (1.575 m)  Wt 236 lb (107.049 kg)  BMI 43.15 kg/m2  SpO2 97%  LMP 10/28/2013  Meds ordered this encounter  Medications  . predniSONE (DELTASONE) tablet 15 mg    Sig:   . morphine 4 MG/ML injection 4 mg    Sig:   . predniSONE (DELTASONE) 10 MG tablet    Sig: 1.5 tabs po daily x 4 days then 1 tab PO daily x 10 days    Dispense:  16 tablet    Refill:  0    Order Specific Question:  Supervising Provider    Answer:  Noemi Chapel D [[id number]]  . oxyCODONE-acetaminophen (PERCOCET) 5-325 MG per tablet    Sig: Take 1-2 tablets by mouth every 6 (six) hours as needed for severe pain.    Dispense:  12 tablet    Refill:  0    Order Specific Question:  Supervising Provider    Answer:  Johnna Acosta [address] [place], PA-C 01/13/15 0740  Kalman Drape, MD 01/13/15 [phone]

## 2015-01-16 ENCOUNTER — Emergency Department (HOSPITAL_COMMUNITY)
Admission: EM | Admit: 2015-01-16 | Discharge: 2015-01-16 | Disposition: A | Discharge status: Home / Self Care | Payer: Medicaid Other | Source: Home / Self Care | Attending: Family Medicine | Admitting: Family Medicine

## 2015-01-16 ENCOUNTER — Emergency Department (INDEPENDENT_AMBULATORY_CARE_PROVIDER_SITE_OTHER): Payer: Medicaid Other

## 2015-01-16 ENCOUNTER — Encounter (HOSPITAL_COMMUNITY): Payer: Self-pay | Admitting: Emergency Medicine

## 2015-01-16 ENCOUNTER — Emergency Department (HOSPITAL_COMMUNITY)
Admission: EM | Admit: 2015-01-16 | Discharge: 2015-01-16 | Disposition: A | Discharge status: Home / Self Care | Payer: Medicaid Other | Attending: Emergency Medicine | Admitting: Emergency Medicine

## 2015-01-16 DIAGNOSIS — Z79899 Other long term (current) drug therapy: Secondary | ICD-10-CM | POA: Diagnosis not present

## 2015-01-16 DIAGNOSIS — I4891 Unspecified atrial fibrillation: Secondary | ICD-10-CM | POA: Insufficient documentation

## 2015-01-16 DIAGNOSIS — Z87891 Personal history of nicotine dependence: Secondary | ICD-10-CM | POA: Insufficient documentation

## 2015-01-16 DIAGNOSIS — M25562 Pain in left knee: Secondary | ICD-10-CM | POA: Diagnosis not present

## 2015-01-16 DIAGNOSIS — I10 Essential (primary) hypertension: Secondary | ICD-10-CM | POA: Insufficient documentation

## 2015-01-16 DIAGNOSIS — M069 Rheumatoid arthritis, unspecified: Secondary | ICD-10-CM

## 2015-01-16 DIAGNOSIS — M25559 Pain in unspecified hip: Secondary | ICD-10-CM

## 2015-01-16 DIAGNOSIS — E119 Type 2 diabetes mellitus without complications: Secondary | ICD-10-CM | POA: Insufficient documentation

## 2015-01-16 DIAGNOSIS — I509 Heart failure, unspecified: Secondary | ICD-10-CM | POA: Insufficient documentation

## 2015-01-16 DIAGNOSIS — F419 Anxiety disorder, unspecified: Secondary | ICD-10-CM | POA: Insufficient documentation

## 2015-01-16 DIAGNOSIS — M2550 Pain in unspecified joint: Secondary | ICD-10-CM | POA: Diagnosis not present

## 2015-01-16 DIAGNOSIS — G8929 Other chronic pain: Secondary | ICD-10-CM | POA: Insufficient documentation

## 2015-01-16 DIAGNOSIS — F329 Major depressive disorder, single episode, unspecified: Secondary | ICD-10-CM | POA: Insufficient documentation

## 2015-01-16 DIAGNOSIS — Z8739 Personal history of other diseases of the musculoskeletal system and connective tissue: Secondary | ICD-10-CM

## 2015-01-16 MED ORDER — PREDNISONE 20 MG PO TABS: 20.0000 mg | ORAL_TABLET | Freq: Every day | ORAL | Status: DC

## 2015-01-16 MED ORDER — OXYCODONE-ACETAMINOPHEN 5-325 MG PO TABS: 1.0000 | ORAL_TABLET | Freq: Four times a day (QID) | ORAL | Status: DC | PRN

## 2015-01-16 MED ORDER — HYDROMORPHONE HCL 2 MG/ML IJ SOLN
2.0000 mg | Freq: Once | INTRAMUSCULAR | Status: AC
  Administered 2015-01-16: 2 mg via INTRAMUSCULAR
  Filled 2015-01-16: qty 1

## 2015-01-16 NOTE — ED Provider Notes (Signed)
CSN: [id number]     Arrival date & time 01/16/15  1601 History   First MD Initiated Contact with Patient 01/16/15 1711     Chief Complaint  Patient presents with  . Knee Pain   (Consider location/radiation/quality/duration/timing/severity/associated sxs/prior Treatment) HPI        54 year old female presents complaining of pain all over her body, most pronounced in her left knee and hip, although her right knee and hip are extremely painful as well. She has had this for months. She has been to the emergency department multiple times but she says they dismissed her as having rheumatoid arthritis pain because she has rheumatoid arthritis and that will not look further into the problem. She has an appointment with her rheumatologist in 3 weeks and an appointment with her orthopedist in 2 weeks. She has no recent change in the pain, although she states it is so severe that she cannot walk. No swelling of the lower extremities. No injury. This started getting worse after her doctor attempted to start weaning her off of prednisone, the emergency department increased her prednisone but has not helped  Past Medical History  Diagnosis Date  . Atrial fibrillation, persistent   . CHF (congestive heart failure) 2013    No echo found from that time, most likely diastolic  . Diabetes mellitus without complication   . Hypertension   . Depression   . Anxiety   . Rheumatoid arthritis    Past Surgical History  Procedure Laterality Date  . Cesarean section      x2  . Left breast cyst    . Laparotomy Right 02/27/2013    Procedure: EXPLORATORY LAPAROTOMY, right salpingo-oopherectomy;  Surgeon: [name], MD;  Location: [facility];  Service: Gynecology;  Laterality: Right;  . Salpingoophorectomy Right 02/27/2013    Procedure: SALPINGO OOPHORECTOMY;  Surgeon: [name], MD;  Location: [facility];  Service: Gynecology;  Laterality: Right;   No family history on file. History  Substance Use Topics  .  Smoking status: Former Smoker -- 0.75 packs/day for 24 years    Types: Cigarettes    Quit date: 06/08/2014  . Smokeless tobacco: Never Used     Comment: smoking cessation info given.   . Alcohol Use: No   OB History    No data available     Review of Systems  Constitutional: Negative for fever and chills.  Cardiovascular: Negative for leg swelling.  Musculoskeletal: Positive for myalgias and arthralgias.  All other systems reviewed and are negative.   Allergies  Ace inhibitors and Lisinopril  Home Medications   Prior to Admission medications   Medication Sig Start Date End Date Taking? Authorizing Provider  folic acid (FOLVITE) 1 MG tablet Take 1 mg by mouth every morning.     Historical Provider, MD  furosemide (LASIX) 20 MG tablet Take 1 tablet (20 mg total) by mouth daily. Take 20 mg twice daily by mouth for four days (12/18/2013 to 12/21/2014), then take 20 mg by mouth daily Patient not taking: Reported on 12/28/2014 12/18/14   [name], PA-C  glipiZIDE (GLUCOTROL) 5 MG tablet Take 2 tablets (10 mg total) by mouth daily before breakfast. 12/28/14   [name], MD  ibuprofen (ADVIL,MOTRIN) 200 MG tablet Take 800 mg by mouth every 6 (six) hours as needed for moderate pain.    Historical Provider, MD  losartan (COZAAR) 25 MG tablet Take 25 mg by mouth every morning.     Historical Provider, MD  metFORMIN (GLUCOPHAGE) 500  MG tablet Take 500 mg by mouth 2 (two) times daily.     Historical Provider, MD  methotrexate (RHEUMATREX) 2.5 MG tablet Take 10 mg by mouth every Monday. Caution:Chemotherapy. Protect from light.    Historical Provider, MD  metoprolol tartrate (LOPRESSOR) 25 MG tablet Take 0.5 tablets (12.5 mg total) by mouth 2 (two) times daily. 10/01/13   Brett Canales, PA-C  oxyCODONE-acetaminophen (PERCOCET) 5-325 MG per tablet Take 1-2 tablets by mouth every 6 (six) hours as needed for severe pain. 01/13/15   [name], PA-C  oxyCODONE-acetaminophen  (PERCOCET/ROXICET) 5-325 MG per tablet Take 2 tablets by mouth every 4 (four) hours as needed for severe pain. 12/29/14   [name], MD  oxyCODONE-acetaminophen (PERCOCET/ROXICET) 5-325 MG per tablet Take 1 tablet by mouth every 6 (six) hours as needed for severe pain. 01/16/15   [name], PA-C  potassium chloride SA (KLOR-CON M20) 20 MEQ tablet Take 20 mg daily for four days Patient not taking: Reported on 12/28/2014 12/18/14   [name], PA-C  predniSONE (DELTASONE) 10 MG tablet Take 10 mg by mouth daily with breakfast.    Historical Provider, MD  predniSONE (DELTASONE) 10 MG tablet 1.5 tabs po daily x 4 days then 1 tab PO daily x 10 days 01/13/15   [name], PA-C  predniSONE (DELTASONE) 20 MG tablet Take 1 tablet (20 mg total) by mouth daily with breakfast. 01/16/15   [name], PA-C  rivaroxaban (XARELTO) 20 MG TABS tablet Take 1 tablet (20 mg total) by mouth daily with supper. 12/18/14   [name], PA-C  traMADol (ULTRAM) 50 MG tablet Take 1 tablet (50 mg total) by mouth every 6 (six) hours as needed. Patient taking differently: Take 50 mg by mouth every 6 (six) hours as needed for moderate pain.  12/28/14   [name], MD   BP 131/84 mmHg  Pulse 114  Temp(Src) 98.5 F (36.9 C) (Oral)  Resp 18  SpO2 97%  LMP 10/28/2013 Physical Exam  Constitutional: She is oriented to person, place, and time. Vital signs are normal. She appears well-developed and well-nourished. No distress.  HENT:  Head: Normocephalic and atraumatic.  Eyes: Conjunctivae are normal.  Neck: Normal range of motion. Neck supple.  Cardiovascular: Normal rate, regular rhythm and normal heart sounds.   Pulmonary/Chest: Effort normal and breath sounds normal. No respiratory distress.  Musculoskeletal:  The joints of the lower extremity appear normal. She has intact range of motion of her knees, although unable to test the hips because she is in the chair and claims she cannot walk. No  obvious redness or swelling.  Lymphadenopathy:    She has no cervical adenopathy.  Neurological: She is alert and oriented to person, place, and time. She has normal strength. Coordination normal.  Skin: Skin is warm and dry. No rash noted. She is not diaphoretic.  Psychiatric: She has a normal mood and affect. Judgment normal.  Nursing note and vitals reviewed.   ED Course  Procedures (including critical care time) Labs Review Labs Reviewed - No data to display  Imaging Review Dg Knee Complete 4 Views Left  01/16/2015   CLINICAL DATA:  Left knee pain. No known injury. Initial encounter.  EXAM: LEFT KNEE - COMPLETE 4+ VIEW  COMPARISON:  12/27/2013 radiographs.  FINDINGS: The mineralization and alignment are normal. There is no evidence of acute fracture or dislocation. Tricompartmental degenerative changes are present, greatest in the medial compartment and perhaps slightly progressive. There is prominent  patellar spurring. No intra-articular loose bodies observed. A large knee joint effusion has enlarged compared with the prior study.  IMPRESSION: No acute osseous findings. Tricompartmental degenerative changes with enlarging chronic knee joint effusion.   Electronically Signed   By: Richardean Sale M.D.   On: 01/16/2015 18:12   Dg Hip Unilat With Pelvis 2-3 Views Left  01/16/2015   CLINICAL DATA:  Left hip pain for several months. On steroid therapy. Initial encounter.  EXAM: LEFT HIP (WITH PELVIS) 2-3 VIEWS  COMPARISON:  Pelvic CT 01/07/2013.  FINDINGS: The mineralization and alignment are normal. There is no evidence of acute fracture or dislocation. There is no evidence of femoral head avascular necrosis. The hip joint spaces are maintained. There are mild degenerative changes at the sacroiliac joints and symphysis pubis. Surgical clips are noted in the right pelvis. There are scattered vascular calcifications.  IMPRESSION: No acute osseous findings or evidence of femoral head avascular  necrosis.   Electronically Signed   By: Richardean Sale M.D.   On: 01/16/2015 18:10     MDM   1. RA (rheumatoid arthritis)   2. Hip pain   3. Hip pain   4. Left knee pain    Her x-rays normal, no evidence of AVN. I believe this pain is related to her rheumatoid arthritis. She has an appointment with her rheumatologist in 3 weeks. She was doing much better on 20 mg daily her prednisone which she was on for a year before starting to wean off. I will restart her 20 mg daily just for the 3 weeks until she sees rheumatology again. Follow-up when necessary   Meds ordered this encounter  Medications  . predniSONE (DELTASONE) 20 MG tablet    Sig: Take 1 tablet (20 mg total) by mouth daily with breakfast.    Dispense:  21 tablet    Refill:  0    Order Specific Question:  Supervising Provider    Answer:  Ernest Mallick  . oxyCODONE-acetaminophen (PERCOCET/ROXICET) 5-325 MG per tablet    Sig: Take 1 tablet by mouth every 6 (six) hours as needed for severe pain.    Dispense:  6 tablet    Refill:  0    Order Specific Question:  Supervising Provider    Answer:  Gregor Hams [place], PA-C 01/16/15 [phone]

## 2015-01-16 NOTE — Discharge Instructions (Signed)
Rheumatoid Arthritis °Rheumatoid arthritis is a long-term (chronic) inflammatory disease that causes pain, swelling, and stiffness of the joints. It can affect the entire body, including the eyes and lungs. The effects of rheumatoid arthritis vary widely among those with the condition. °CAUSES  °The cause of rheumatoid arthritis is not known. It tends to run in families and is more common in women. Certain cells of the body's natural defense system (immune system) do not work properly and begin to attack healthy joints. It primarily involves the connective tissue that lines the joints (synovial membrane). This can cause damage to the joint. °SYMPTOMS  °· Pain, stiffness, swelling, and decreased motion of many joints, especially in the hands and feet. °· Stiffness that is worse in the morning. It may last 1-2 hours or longer. °· Numbness and tingling in the hands. °· Fatigue. °· Loss of appetite. °· Weight loss. °· Low-grade fever. °· Dry eyes and mouth. °· Firm lumps (rheumatoid nodules) that grow beneath the skin in areas such as the elbows and hands. °DIAGNOSIS  °Diagnosis is based on the symptoms described, an exam, and blood tests. Sometimes, X-rays are helpful. °TREATMENT  °The goals of treatment are to relieve pain, reduce inflammation, and to slow down or stop joint damage and disability. Methods vary and may include: °· Maintaining a balance of rest, exercise, and proper nutrition. °· Medicines: °¨ Pain relievers (analgesics). °¨ Corticosteroids and nonsteroidal anti-inflammatory drugs (NSAIDs) to reduce inflammation. °¨ Disease-modifying antirheumatic drugs (DMARDs) to try to slow the course of the disease. °¨ Biologic response modifiers to reduce inflammation and damage. °· Physical therapy and occupational therapy. °· Surgery for patients with severe joint damage. Joint replacement or fusing of joints may be needed. °· Routine monitoring and ongoing care, such as office visits, blood and urine tests, and  X-rays. °HOME CARE INSTRUCTIONS  °· Remain physically active and reduce activity when the disease gets worse. °· Eat a well-balanced diet. °· Put heat on affected joints when you wake up and before activities. Keep the heat on the affected joint for as long as directed by your health care provider. °· Put ice on affected joints following activities or exercising. °¨ Put ice in a plastic bag. °¨ Place a towel between your skin and the bag. °¨ Leave the ice on for 15-20 minutes, 3-4 times per day, or as directed by your health care provider. °· Take medicines and supplements only as directed by your health care provider. °· Use splints as directed by your health care provider. Splints help maintain joint position and function. °· Do not sleep with pillows under your knees. This may lead to spasms. °· Participate in a self-management program to keep current with the latest treatment and coping skills. °SEEK IMMEDIATE MEDICAL CARE IF: °· You have fainting episodes. °· You have periods of extreme weakness. °· You rapidly develop a hot, painful joint that is more severe than usual joint aches. °· You have chills. °· You have a fever. °FOR MORE INFORMATION  °· American College of Rheumatology: www.rheumatology.org °· [employer]: www.arthritis.org °Document Released: 10/22/2000 Document Revised: 03/11/2014 Document Reviewed: 12/01/2011 °[employer]® Patient Information ©2015 [employer]. This information is not intended to replace advice given to you by your health care provider. Make sure you discuss any questions you have with your health care provider. ° °

## 2015-01-16 NOTE — ED Notes (Addendum)
Left knee pain, pain is the outside of left knee.  No known injury.  Was seen at [facility] long 3/7.  Told [place] staff she was out of prednisone for 2 weeks.  Patient is reluctant to accept knee pain may be rheumatoid arthritis.  Reports pcp appt 4/4, rheumatologist 3/29.  Patient denies recent injury

## 2015-01-16 NOTE — ED Notes (Signed)
Pt states all her joints in her body hurt  Pt has chronic pain and states she is out of pain medication  Pt states she just wants something for pain tonight until she can get in with her dr tomorrow

## 2015-01-16 NOTE — Progress Notes (Signed)
Patient ID: Kathleen Kane, female   DOB: Jun 23, 1961, 54 y.o.   MRN: [id number] 54 y.o. obese black female f/u afib, diastolic dysfunction and edema   Previously seen by Dr Gwenlyn Found Those records not available Has chronic afib. Seen by PA;s last few months I have not seen her since 2013.  2/16 lasix dose adjusted and started on xarelto By Rosaria Ferries PA.  Issues with compliance due to finances in past and poor insight into medical issues especialy DM.    Reviewed echo from 12/24/14  Study Conclusions  - Left ventricle: The cavity size was normal. Wall thickness was normal. Systolic function was normal. The estimated ejection fraction was in the range of 50% to 55%. Diffuse hypokinesis. Although no diagnostic regional wall motion abnormality was identified, this possibility cannot be completely excluded on the basis of this study. - Mitral valve: There was mild regurgitation directed posteriorly. - Left atrium: The atrium was moderately dilated. - Pulmonary arteries: Systolic pressure was mildly increased. PA peak pressure: 33 mm Hg (S).  Impressions:  - Compared to the prior study, there has been no significant interval change.  02/16/13 Normal lexiscan myovue study  Labs reviewed 12/17/14 BNP only 133     ROS: Denies fever, malais, weight loss, blurry vision, decreased visual acuity, cough, sputum, SOB, hemoptysis, pleuritic pain, palpitaitons, heartburn, abdominal pain, melena, lower extremity edema, claudication, or rash.  All other systems reviewed and negative  General: Affect appropriate Healthy:  appears stated age 89: normal Neck supple with no adenopathy JVP normal no bruits no thyromegaly Lungs clear with no wheezing and good diaphragmatic motion Heart:  S1/S2 no murmur, no rub, gallop or click PMI normal Abdomen: benighn, BS positve, no tenderness, no AAA no bruit.  No HSM or HJR Distal pulses intact with no bruits No edema Neuro non-focal Skin  warm and dry No muscular weakness   Current Outpatient Prescriptions  Medication Sig Dispense Refill  . folic acid (FOLVITE) 1 MG tablet Take 1 mg by mouth every morning.     . furosemide (LASIX) 20 MG tablet Take 1 tablet (20 mg total) by mouth daily. Take 20 mg twice daily by mouth for four days (12/18/2013 to 12/21/2014), then take 20 mg by mouth daily (Patient not taking: Reported on 12/28/2014) 30 tablet 3  . glipiZIDE (GLUCOTROL) 5 MG tablet Take 2 tablets (10 mg total) by mouth daily before breakfast. 30 tablet 0  . ibuprofen (ADVIL,MOTRIN) 200 MG tablet Take 800 mg by mouth every 6 (six) hours as needed for moderate pain.    Marland Kitchen losartan (COZAAR) 25 MG tablet Take 25 mg by mouth every morning.     . metFORMIN (GLUCOPHAGE) 500 MG tablet Take 500 mg by mouth 2 (two) times daily.     . methotrexate (RHEUMATREX) 2.5 MG tablet Take 10 mg by mouth every Monday. Caution:Chemotherapy. Protect from light.    . metoprolol tartrate (LOPRESSOR) 25 MG tablet Take 0.5 tablets (12.5 mg total) by mouth 2 (two) times daily. 180 tablet 3  . oxyCODONE-acetaminophen (PERCOCET) 5-325 MG per tablet Take 1-2 tablets by mouth every 6 (six) hours as needed for severe pain. 12 tablet 0  . oxyCODONE-acetaminophen (PERCOCET/ROXICET) 5-325 MG per tablet Take 2 tablets by mouth every 4 (four) hours as needed for severe pain. 15 tablet 0  . potassium chloride SA (KLOR-CON M20) 20 MEQ tablet Take 20 mg daily for four days (Patient not taking: Reported on 12/28/2014) 4 tablet 0  . predniSONE (DELTASONE) 10 MG  tablet Take 10 mg by mouth daily with breakfast.    . predniSONE (DELTASONE) 10 MG tablet 1.5 tabs po daily x 4 days then 1 tab PO daily x 10 days 16 tablet 0  . rivaroxaban (XARELTO) 20 MG TABS tablet Take 1 tablet (20 mg total) by mouth daily with supper. 30 tablet 11  . traMADol (ULTRAM) 50 MG tablet Take 1 tablet (50 mg total) by mouth every 6 (six) hours as needed. (Patient taking differently: Take 50 mg by mouth  every 6 (six) hours as needed for moderate pain. ) 20 tablet 0   No current facility-administered medications for this visit.    Allergies  Ace inhibitors and Lisinopril  Electrocardiogram:  12/18/14  afib rate 96 nonspecific ST/ [place] changes   Assessment and Plan

## 2015-01-16 NOTE — Discharge Instructions (Signed)
Arthritis, Nonspecific °Arthritis is pain, redness, warmth, or puffiness (inflammation) of a joint. The joint may be stiff or hurt when you move it. One or more joints may be affected. There are many types of arthritis. Your doctor may not know what type you have right away. The most common cause of arthritis is wear and tear on the joint (osteoarthritis). °HOME CARE  °· Only take medicine as told by your doctor. °· Rest the joint as much as possible. °· Raise (elevate) your joint if it is puffy. °· Use crutches if the painful joint is in your leg. °· Drink enough fluids to keep your pee (urine) clear or pale yellow. °· Follow your doctor's diet instructions. °· Use cold packs for very bad joint pain for 10 to 15 minutes every hour. Ask your doctor if it is okay for you to use hot packs. °· Exercise as told by your doctor. °· Take a warm shower if you have stiffness in the morning. °· Move your sore joints throughout the day. °GET HELP RIGHT AWAY IF:  °· You have a fever. °· You have very bad joint pain, puffiness, or redness. °· You have many joints that are painful and puffy. °· You are not getting better with treatment. °· You have very bad back pain or leg weakness. °· You cannot control when you poop (bowel movement) or pee (urinate). °· You do not feel better in 24 hours or are getting worse. °· You are having side effects from your medicine. °MAKE SURE YOU:  °· Understand these instructions. °· Will watch your condition. °· Will get help right away if you are not doing well or get worse. °Document Released: 01/19/2010 Document Revised: 04/25/2012 Document Reviewed: 01/19/2010 °[employer]® Patient Information ©2015 [employer]. This information is not intended to replace advice given to you by your health care provider. Make sure you discuss any questions you have with your health care provider. ° °

## 2015-01-16 NOTE — ED Provider Notes (Signed)
CSN: [id number]     Arrival date & time 01/16/15  2021 History   None   This chart was scribed for non-physician practitioner, Charlann Lange, working with [name], MD by Terressa Koyanagi, ED Scribe. This patient was seen in room [name] and the patient's care was started at 9:41 PM.  Chief Complaint  Patient presents with  . Joint Pain   The history is provided by the patient. No language interpreter was used.   PCP: Philis Fendt, MD HPI Comments: Kathleen Kane is a 54 y.o. female, with PMH noted below including chronic pain, who presents to the Emergency Department complaining of pain to her entire body. Pt was treated for the same at [facility] ED for the same earlier today. Pt reports that she takes percocet for her pain. However, the pharmacy won't refill her percocet. Pt reports she plans to see her doctor tomorrow and needs some pain meds to get her through tonight. Pt denies any medicinal allergies.    Past Medical History  Diagnosis Date  . Atrial fibrillation, persistent   . CHF (congestive heart failure) 2013    No echo found from that time, most likely diastolic  . Diabetes mellitus without complication   . Hypertension   . Depression   . Anxiety   . Rheumatoid arthritis    Past Surgical History  Procedure Laterality Date  . Cesarean section      x2  . Left breast cyst    . Laparotomy Right 02/27/2013    Procedure: EXPLORATORY LAPAROTOMY, right salpingo-oopherectomy;  Surgeon: [name], MD;  Location: [facility];  Service: Gynecology;  Laterality: Right;  . Salpingoophorectomy Right 02/27/2013    Procedure: SALPINGO OOPHORECTOMY;  Surgeon: [name], MD;  Location: [facility];  Service: Gynecology;  Laterality: Right;   Family History  Problem Relation Age of Onset  . Diabetes Other    History  Substance Use Topics  . Smoking status: Former Smoker -- 0.75 packs/day for 24 years    Types: Cigarettes    Quit date: 06/08/2014  . Smokeless tobacco: Never  Used     Comment: smoking cessation info given.   . Alcohol Use: No   OB History    No data available     Review of Systems  Constitutional: Negative for fever and chills.  Musculoskeletal:       Pain in all joints   Psychiatric/Behavioral: Negative for confusion.   Allergies  Ace inhibitors and Lisinopril  Home Medications   Prior to Admission medications   Medication Sig Start Date End Date Taking? Authorizing Provider  folic acid (FOLVITE) 1 MG tablet Take 1 mg by mouth every morning.    Yes Historical Provider, MD  ibuprofen (ADVIL,MOTRIN) 200 MG tablet Take 800 mg by mouth every 6 (six) hours as needed for moderate pain (pain).    Yes Historical Provider, MD  metFORMIN (GLUCOPHAGE) 500 MG tablet Take 500 mg by mouth 2 (two) times daily.    Yes Historical Provider, MD  metoprolol tartrate (LOPRESSOR) 25 MG tablet Take 0.5 tablets (12.5 mg total) by mouth 2 (two) times daily. 10/01/13  Yes Brett Canales, PA-C  oxyCODONE-acetaminophen (PERCOCET/ROXICET) 5-325 MG per tablet Take 1 tablet by mouth every 6 (six) hours as needed for severe pain. 01/16/15  Yes [name], PA-C  predniSONE (DELTASONE) 20 MG tablet Take 1 tablet (20 mg total) by mouth daily with breakfast. 01/16/15  Yes [name], PA-C  furosemide (LASIX) 20 MG tablet  Take 1 tablet (20 mg total) by mouth daily. Take 20 mg twice daily by mouth for four days (12/18/2013 to 12/21/2014), then take 20 mg by mouth daily Patient not taking: Reported on 12/28/2014 12/18/14   [name], PA-C  glipiZIDE (GLUCOTROL) 5 MG tablet Take 2 tablets (10 mg total) by mouth daily before breakfast. Patient not taking: Reported on 01/16/2015 12/28/14   [name], MD  methotrexate (RHEUMATREX) 2.5 MG tablet Take 10 mg by mouth every Monday. Caution:Chemotherapy. Protect from light.    Historical Provider, MD  oxyCODONE-acetaminophen (PERCOCET) 5-325 MG per tablet Take 1-2 tablets by mouth every 6 (six) hours as needed for severe  pain. Patient not taking: Reported on 01/16/2015 01/13/15   [name], PA-C  oxyCODONE-acetaminophen (PERCOCET/ROXICET) 5-325 MG per tablet Take 2 tablets by mouth every 4 (four) hours as needed for severe pain. Patient not taking: Reported on 01/16/2015 12/29/14   [name], MD  potassium chloride SA (KLOR-CON M20) 20 MEQ tablet Take 20 mg daily for four days Patient not taking: Reported on 12/28/2014 12/18/14   [name], PA-C  predniSONE (DELTASONE) 10 MG tablet 1.5 tabs po daily x 4 days then 1 tab PO daily x 10 days Patient not taking: Reported on 01/16/2015 01/13/15   [name], PA-C  rivaroxaban (XARELTO) 20 MG TABS tablet Take 1 tablet (20 mg total) by mouth daily with supper. Patient not taking: Reported on 01/16/2015 12/18/14   [name], PA-C  traMADol (ULTRAM) 50 MG tablet Take 1 tablet (50 mg total) by mouth every 6 (six) hours as needed. Patient not taking: Reported on 01/16/2015 12/28/14   [name], MD   Triage Vitals: BP 135/83 mmHg  Pulse 120  Temp(Src) 98.1 F (36.7 C) (Oral)  Resp 20  Ht 5\' 2"  (1.575 m)  Wt 236 lb (107.049 kg)  BMI 43.15 kg/m2  SpO2 98%  LMP 10/28/2013 Physical Exam  Constitutional: She is oriented to person, place, and time. She appears well-developed and well-nourished. No distress.  HENT:  Head: Normocephalic and atraumatic.  Eyes: Conjunctivae and EOM are normal.  Neck: Neck supple. No tracheal deviation present.  Cardiovascular: Normal rate.   Pulmonary/Chest: Effort normal. No respiratory distress.  Musculoskeletal: She exhibits tenderness.  Generalized joint tenderness without redness or warmth. No swelling.   Neurological: She is alert and oriented to person, place, and time.  Skin: Skin is warm and dry.  Psychiatric: She has a normal mood and affect. Her behavior is normal.  Nursing note and vitals reviewed.   ED Course  Procedures (including critical care time) DIAGNOSTIC STUDIES: Oxygen  Saturation is 98% on RA, nl by my interpretation.    COORDINATION OF CARE: 9:50 PM-Discussed treatment plan which includes dilaudid shot with pt at bedside and pt agreed to plan.   Labs Review Labs Reviewed - No data to display  Imaging Review Dg Knee Complete 4 Views Left  01/16/2015   CLINICAL DATA:  Left knee pain. No known injury. Initial encounter.  EXAM: LEFT KNEE - COMPLETE 4+ VIEW  COMPARISON:  12/27/2013 radiographs.  FINDINGS: The mineralization and alignment are normal. There is no evidence of acute fracture or dislocation. Tricompartmental degenerative changes are present, greatest in the medial compartment and perhaps slightly progressive. There is prominent patellar spurring. No intra-articular loose bodies observed. A large knee joint effusion has enlarged compared with the prior study.  IMPRESSION: No acute osseous findings. Tricompartmental degenerative changes with enlarging chronic knee joint effusion.   Electronically  Signed   By: Richardean Sale M.D.   On: 01/16/2015 18:12   Dg Hip Unilat With Pelvis 2-3 Views Left  01/16/2015   CLINICAL DATA:  Left hip pain for several months. On steroid therapy. Initial encounter.  EXAM: LEFT HIP (WITH PELVIS) 2-3 VIEWS  COMPARISON:  Pelvic CT 01/07/2013.  FINDINGS: The mineralization and alignment are normal. There is no evidence of acute fracture or dislocation. There is no evidence of femoral head avascular necrosis. The hip joint spaces are maintained. There are mild degenerative changes at the sacroiliac joints and symphysis pubis. Surgical clips are noted in the right pelvis. There are scattered vascular calcifications.  IMPRESSION: No acute osseous findings or evidence of femoral head avascular necrosis.   Electronically Signed   By: Richardean Sale M.D.   On: 01/16/2015 18:10     EKG Interpretation None      MDM   Final diagnoses:  None    1. Arthritis  Pain managed in ED. No new prescriptions provided. No evidence to cause  concern for septic joints or infection. Stable for discharge home.   I personally performed the services described in this documentation, which was scribed in my presence. The recorded information has been reviewed and is accurate.      Charlann Lange, PA-C 01/16/15 3009  [name], MD 01/20/15 2308

## 2015-01-17 ENCOUNTER — Encounter: Payer: Medicaid Other | Admitting: Cardiovascular Disease

## 2015-01-17 ENCOUNTER — Other Ambulatory Visit: Payer: Medicaid Other

## 2015-01-21 ENCOUNTER — Encounter: Payer: Self-pay | Admitting: Cardiovascular Disease

## 2015-02-04 DIAGNOSIS — Z79899 Other long term (current) drug therapy: Secondary | ICD-10-CM | POA: Insufficient documentation

## 2015-02-20 ENCOUNTER — Other Ambulatory Visit: Payer: Self-pay | Admitting: Internal Medicine

## 2015-02-20 ENCOUNTER — Other Ambulatory Visit: Payer: Self-pay

## 2015-02-20 DIAGNOSIS — N6001 Solitary cyst of right breast: Secondary | ICD-10-CM

## 2015-02-26 ENCOUNTER — Other Ambulatory Visit: Payer: Medicaid Other

## 2015-03-06 ENCOUNTER — Ambulatory Visit: Payer: Medicaid Other | Admitting: Cardiovascular Disease

## 2015-03-11 ENCOUNTER — Other Ambulatory Visit: Payer: Self-pay | Admitting: Internal Medicine

## 2015-03-11 ENCOUNTER — Ambulatory Visit
Admission: RE | Admit: 2015-03-11 | Discharge: 2015-03-11 | Disposition: A | Discharge status: Home / Self Care | Payer: Self-pay | Source: Ambulatory Visit | Attending: Internal Medicine | Admitting: Internal Medicine

## 2015-03-11 DIAGNOSIS — N6001 Solitary cyst of right breast: Secondary | ICD-10-CM

## 2015-03-31 ENCOUNTER — Emergency Department (HOSPITAL_COMMUNITY): Payer: Medicaid Other

## 2015-03-31 ENCOUNTER — Encounter (HOSPITAL_COMMUNITY): Payer: Self-pay | Admitting: Emergency Medicine

## 2015-03-31 ENCOUNTER — Emergency Department (HOSPITAL_COMMUNITY)
Admission: EM | Admit: 2015-03-31 | Discharge: 2015-03-31 | Disposition: A | Discharge status: Home / Self Care | Payer: Medicaid Other | Attending: Emergency Medicine | Admitting: Emergency Medicine

## 2015-03-31 DIAGNOSIS — I10 Essential (primary) hypertension: Secondary | ICD-10-CM | POA: Diagnosis not present

## 2015-03-31 DIAGNOSIS — E669 Obesity, unspecified: Secondary | ICD-10-CM | POA: Diagnosis not present

## 2015-03-31 DIAGNOSIS — J069 Acute upper respiratory infection, unspecified: Secondary | ICD-10-CM | POA: Insufficient documentation

## 2015-03-31 DIAGNOSIS — I509 Heart failure, unspecified: Secondary | ICD-10-CM | POA: Diagnosis not present

## 2015-03-31 DIAGNOSIS — F329 Major depressive disorder, single episode, unspecified: Secondary | ICD-10-CM | POA: Insufficient documentation

## 2015-03-31 DIAGNOSIS — Z79899 Other long term (current) drug therapy: Secondary | ICD-10-CM | POA: Insufficient documentation

## 2015-03-31 DIAGNOSIS — R05 Cough: Secondary | ICD-10-CM | POA: Diagnosis present

## 2015-03-31 DIAGNOSIS — Z8739 Personal history of other diseases of the musculoskeletal system and connective tissue: Secondary | ICD-10-CM | POA: Diagnosis not present

## 2015-03-31 DIAGNOSIS — Z87891 Personal history of nicotine dependence: Secondary | ICD-10-CM | POA: Diagnosis not present

## 2015-03-31 DIAGNOSIS — F419 Anxiety disorder, unspecified: Secondary | ICD-10-CM | POA: Diagnosis not present

## 2015-03-31 DIAGNOSIS — E119 Type 2 diabetes mellitus without complications: Secondary | ICD-10-CM | POA: Insufficient documentation

## 2015-03-31 DIAGNOSIS — Z7952 Long term (current) use of systemic steroids: Secondary | ICD-10-CM | POA: Insufficient documentation

## 2015-03-31 DIAGNOSIS — R058 Other specified cough: Secondary | ICD-10-CM

## 2015-03-31 HISTORY — DX: Obesity, unspecified: E66.9

## 2015-03-31 MED ORDER — ALBUTEROL SULFATE HFA 108 (90 BASE) MCG/ACT IN AERS
2.0000 | INHALATION_SPRAY | RESPIRATORY_TRACT | Status: DC
  Administered 2015-03-31: 2 via RESPIRATORY_TRACT
  Filled 2015-03-31: qty 6.7

## 2015-03-31 MED ORDER — HYDROCODONE-HOMATROPINE 5-1.5 MG/5ML PO SYRP: 5.0000 mL | ORAL_SOLUTION | Freq: Four times a day (QID) | ORAL | Status: DC | PRN

## 2015-03-31 NOTE — Discharge Instructions (Signed)
Upper Respiratory Infection, Adult Follow up with your primary care provider if no symptoms improvement in 48 hours.  Return for fever or worsening symptoms.  An upper respiratory infection (URI) is also known as the common cold. It is often caused by a type of germ (virus). Colds are easily spread (contagious). You can pass it to others by kissing, coughing, sneezing, or drinking out of the same glass. Usually, you get better in 1 or 2 weeks.  HOME CARE   Only take medicine as told by your doctor.  Use a warm mist humidifier or breathe in steam from a hot shower.  Drink enough water and fluids to keep your pee (urine) clear or pale yellow.  Get plenty of rest.  Return to work when your temperature is back to normal or as told by your doctor. You may use a face mask and wash your hands to stop your cold from spreading. GET HELP RIGHT AWAY IF:   After the first few days, you feel you are getting worse.  You have questions about your medicine.  You have chills, shortness of breath, or brown or red spit (mucus).  You have yellow or brown snot (nasal discharge) or pain in the face, especially when you bend forward.  You have a fever, puffy (swollen) neck, pain when you swallow, or white spots in the back of your throat.  You have a bad headache, ear pain, sinus pain, or chest pain.  You have a high-pitched whistling sound when you breathe in and out (wheezing).  You have a lasting cough or cough up blood.  You have sore muscles or a stiff neck. MAKE SURE YOU:   Understand these instructions.  Will watch your condition.  Will get help right away if you are not doing well or get worse. Document Released: 04/12/2008 Document Revised: 01/17/2012 Document Reviewed: 01/30/2014 [facility] Patient Information 2015 [place], [place]. This information is not intended to replace advice given to you by your health care provider. Make sure you discuss any questions you have with your health care  provider.

## 2015-03-31 NOTE — ED Notes (Signed)
Pt. reports productive cough , bilateral ear itching and itchy throat onset last week , denies fever or chills. Respirations unlabored.

## 2015-03-31 NOTE — ED Provider Notes (Signed)
CSN: [id number]     Arrival date & time 03/31/15  2151 History  This chart was scribed for non-physician practitioner working, Maximiano Coss, PA-C,  with Nat Christen, MD, by Jeanell Sparrow, ED Scribe. This patient was seen in room TR07C/[facility]07C and the patient's care was started at 10:05 PM.   Chief Complaint  Patient presents with  . Cough   The history is provided by the patient. No language interpreter was used.   HPI Comments: Kathleen Kane is a 54 y.o. female who presents to the Emergency Department complaining of intermittent moderate cough that started about 10 days ago. She states that she has been sick with a cough, itchy ears, and itchy throat. She reports that the hacking cough is productive of yellow and white sputum. She reports no treatment PTA. She states that she has a hx of DM and HTN. She denies any sick contacts, or hx of asthma or COPD. She also denies any fever, SOB, or wheezing.   Past Medical History  Diagnosis Date  . Atrial fibrillation, persistent   . CHF (congestive heart failure) 2013    No echo found from that time, most likely diastolic  . Diabetes mellitus without complication   . Hypertension   . Depression   . Anxiety   . Rheumatoid arthritis   . Obesity    Past Surgical History  Procedure Laterality Date  . Cesarean section      x2  . Left breast cyst    . Laparotomy Right 02/27/2013    Procedure: EXPLORATORY LAPAROTOMY, right salpingo-oopherectomy;  Surgeon: [name], MD;  Location: [facility];  Service: Gynecology;  Laterality: Right;  . Salpingoophorectomy Right 02/27/2013    Procedure: SALPINGO OOPHORECTOMY;  Surgeon: [name], MD;  Location: [facility];  Service: Gynecology;  Laterality: Right;   Family History  Problem Relation Age of Onset  . Diabetes Other    History  Substance Use Topics  . Smoking status: Former Smoker -- 0.75 packs/day for 24 years    Types: Cigarettes    Quit date: 06/08/2014  . Smokeless tobacco: Never  Used     Comment: smoking cessation info given.   . Alcohol Use: No   OB History    No data available     Review of Systems  Constitutional: Negative for fever.  HENT: Positive for ear pain and sore throat. Negative for voice change.   Respiratory: Positive for cough. Negative for chest tightness, shortness of breath and wheezing.   Cardiovascular: Negative for chest pain.  Neurological: Negative for light-headedness.    Allergies  Ace inhibitors and Lisinopril  Home Medications   Prior to Admission medications   Medication Sig Start Date End Date Taking? Authorizing Provider  folic acid (FOLVITE) 1 MG tablet Take 1 mg by mouth every morning.     Historical Provider, MD  furosemide (LASIX) 20 MG tablet Take 1 tablet (20 mg total) by mouth daily. Take 20 mg twice daily by mouth for four days (12/18/2013 to 12/21/2014), then take 20 mg by mouth daily Patient not taking: Reported on 12/28/2014 12/18/14   [name], PA-C  glipiZIDE (GLUCOTROL) 5 MG tablet Take 2 tablets (10 mg total) by mouth daily before breakfast. Patient not taking: Reported on 01/16/2015 12/28/14   [name], MD  HYDROcodone-homatropine [facility]) 5-1.5 MG/5ML syrup Take 5 mLs by mouth every 6 (six) hours as needed for cough. 03/31/15   Corrion Stirewalt Patel-Mills, PA-C  ibuprofen (ADVIL,MOTRIN) 200 MG tablet Take 800 mg  by mouth every 6 (six) hours as needed for moderate pain (pain).     Historical Provider, MD  metFORMIN (GLUCOPHAGE) 500 MG tablet Take 500 mg by mouth 2 (two) times daily.     Historical Provider, MD  methotrexate (RHEUMATREX) 2.5 MG tablet Take 10 mg by mouth every Monday. Caution:Chemotherapy. Protect from light.    Historical Provider, MD  metoprolol tartrate (LOPRESSOR) 25 MG tablet Take 0.5 tablets (12.5 mg total) by mouth 2 (two) times daily. 10/01/13   Brett Canales, PA-C  oxyCODONE-acetaminophen (PERCOCET) 5-325 MG per tablet Take 1-2 tablets by mouth every 6 (six) hours as needed for severe  pain. Patient not taking: Reported on 01/16/2015 01/13/15   [name], PA-C  oxyCODONE-acetaminophen (PERCOCET/ROXICET) 5-325 MG per tablet Take 2 tablets by mouth every 4 (four) hours as needed for severe pain. Patient not taking: Reported on 01/16/2015 12/29/14   [name], MD  oxyCODONE-acetaminophen (PERCOCET/ROXICET) 5-325 MG per tablet Take 1 tablet by mouth every 6 (six) hours as needed for severe pain. 01/16/15   [name], PA-C  potassium chloride SA (KLOR-CON M20) 20 MEQ tablet Take 20 mg daily for four days Patient not taking: Reported on 12/28/2014 12/18/14   [name], PA-C  predniSONE (DELTASONE) 10 MG tablet 1.5 tabs po daily x 4 days then 1 tab PO daily x 10 days Patient not taking: Reported on 01/16/2015 01/13/15   [name], PA-C  predniSONE (DELTASONE) 20 MG tablet Take 1 tablet (20 mg total) by mouth daily with breakfast. 01/16/15   [name], PA-C  rivaroxaban (XARELTO) 20 MG TABS tablet Take 1 tablet (20 mg total) by mouth daily with supper. Patient not taking: Reported on 01/16/2015 12/18/14   [name], PA-C  traMADol (ULTRAM) 50 MG tablet Take 1 tablet (50 mg total) by mouth every 6 (six) hours as needed. Patient not taking: Reported on 01/16/2015 12/28/14   [name], MD   BP 126/86 mmHg  Pulse 112  Temp(Src) 98.7 F (37.1 C) (Oral)  Resp 22  Ht 5' 2.75" (1.594 m)  Wt 245 lb (111.131 kg)  BMI 43.74 kg/m2  SpO2 99%  LMP 10/28/2013 Physical Exam  Constitutional: She is oriented to person, place, and time. She appears well-developed and well-nourished. No distress.  HENT:  Head: Normocephalic and atraumatic.  Right Ear: External ear normal.  Left Ear: External ear normal.  Mouth/Throat: Uvula is midline and oropharynx is clear and moist. No oropharyngeal exudate or posterior oropharyngeal erythema.  Eyes: Conjunctivae and EOM are normal.  Neck: Neck supple. No tracheal deviation present.  Cardiovascular: Normal rate,  regular rhythm and normal heart sounds.   Pulmonary/Chest: Effort normal and breath sounds normal. No respiratory distress. She has no wheezes. She has no rales.  Musculoskeletal: Normal range of motion.  Lymphadenopathy:    She has no cervical adenopathy (anterior).  Neurological: She is alert and oriented to person, place, and time.  Skin: Skin is warm and dry.  Psychiatric: She has a normal mood and affect. Her behavior is normal.  Nursing note and vitals reviewed.   ED Course  Procedures (including critical care time) DIAGNOSTIC STUDIES: Oxygen Saturation is 99% on RA, normal by my interpretation.    COORDINATION OF CARE: 10:13 PM- Pt advised of plan for treatment which includes radiology and pt agrees.  Labs Review Labs Reviewed - No data to display  Imaging Review Dg Chest 2 View  03/31/2015   CLINICAL DATA:  Cough for 10  days.  EXAM: CHEST  2 VIEW  COMPARISON:  12/17/2014  FINDINGS: Cardiac silhouette is borderline enlarged. No mediastinal or hilar masses or convincing adenopathy.  Prominent interstitial markings, stable. No lung consolidation or pulmonary edema. No pleural effusion or pneumothorax.  Bony thorax is grossly intact.  IMPRESSION: No acute cardiopulmonary disease.   Electronically Signed   By: Lajean Manes M.D.   On: 03/31/2015 22:32     EKG Interpretation None      MDM   Final diagnoses:  Viral upper respiratory illness  Patient presents for cough, throat and ear pain for 10 days.  Her exam is normal and chest xray is negative for pneumonia or edema. She is currently on methotrexate and prednisone for RA.  She was given hycodan, albuterol inhaler to go home with.  She is to follow up with her pcp and I have given her return precautions such as fever, worsening cough or SOB. She agrees with the plan.   I personally performed the services described in this documentation, which was scribed in my presence. The recorded information has been reviewed and is  accurate.    Ottie Glazier, PA-C 04/01/15 1331  Nat Christen, MD 04/02/15 1123

## 2015-03-31 NOTE — ED Notes (Signed)
Pt states for 1.5 weeks she has had productive cough, itchy throat and ears. Pt was concerned about pneumonia. Denies any fever at home.

## 2015-04-06 NOTE — Progress Notes (Signed)
Cardiology Office Note   Date:  04/06/2015   ID:  Kathleen Kane, DOB 1961-05-09, MRN [id number]  PCP:  Philis Fendt, MD  Cardiologist: Dr. Oswaldo Conroy, MD   No chief complaint on file.  History of Present Illness: Kathleen Kane is a 54 y.o. female  I have not seen in 3 years Seen by PA in 2014 and again on 12/18/14    She has a history of HTN, atrial fib, tob use, DM, not on coumadin due to concerns for compliance with meds and f/u. Newer agents too expensive.   Seen by me  2013, in atrial fib then, he felt not a good coumadin candidate due to compliance, understanding of her condition.  Seen 2014 pre-op for Right mucinous cystadenofibroma  surgery and had MV, no scar/ischemia and EF 61%. Seen post-op for afib, no SR ECGs in system.  Seen in ER 12/17/2014 for SOB & LE edema after seeing primary MD and taking Lasix x 5 days. She was given Lasix 20 mg IV in the ER and appt made for today.  Lasix adjusted and started on xarelto by PA  Echo updated 12/24/14  EF 50-55% moderate LAE mild MR Estimated PA only 33 mmHg   Past Medical History  Diagnosis Date  . Atrial fibrillation, persistent   . CHF (congestive heart failure) 2013    No echo found from that time, most likely diastolic  . Diabetes mellitus without complication   . Hypertension   . Depression   . Anxiety   . Rheumatoid arthritis   . Obesity     Past Surgical History  Procedure Laterality Date  . Cesarean section      x2  . Left breast cyst    . Laparotomy Right 02/27/2013    Procedure: EXPLORATORY LAPAROTOMY, right salpingo-oopherectomy;  Surgeon: [name], MD;  Location: [facility];  Service: Gynecology;  Laterality: Right;  . Salpingoophorectomy Right 02/27/2013    Procedure: SALPINGO OOPHORECTOMY;  Surgeon: [name], MD;  Location: [facility];  Service: Gynecology;  Laterality: Right;    Current Outpatient Prescriptions  Medication Sig Dispense Refill  . folic acid (FOLVITE) 1 MG  tablet Take 1 mg by mouth daily.    . furosemide (LASIX) 20 MG tablet Take 1 tablet (20 mg total) by mouth daily. Take 20 mg twice daily by mouth for four days (12/18/2013 to 12/21/2014), then take 20 mg by mouth daily 30 tablet 3  . ibuprofen (ADVIL,MOTRIN) 200 MG tablet Take 800 mg by mouth every 6 (six) hours as needed for moderate pain.    Marland Kitchen losartan (COZAAR) 25 MG tablet Take 25 mg by mouth daily.    . metFORMIN (GLUCOPHAGE) 500 MG tablet Take 500 mg by mouth at bedtime.    . methotrexate (RHEUMATREX) 2.5 MG tablet Take 10 mg by mouth once a week. Caution:Chemotherapy. Protect from light.    . metoprolol tartrate (LOPRESSOR) 25 MG tablet Take 0.5 tablets (12.5 mg total) by mouth 2 (two) times daily. 180 tablet 3  . potassium chloride SA (KLOR-CON M20) 20 MEQ tablet Take 20 mg daily for four days 4 tablet 0   Allergies:   Ace inhibitors and Lisinopril   Social History:  The patient  reports that she quit smoking about 9 months ago. Her smoking use included Cigarettes. She has a 18 pack-year smoking history. She has never used smokeless tobacco. She reports that she does not drink alcohol or use illicit drugs.   Family  History:  The patient's family is negative for premature CAD. Mother is living, no hx CAD, remote hx palpitations. Father is deceased.   ROS:  Please see the history of present illness.   Otherwise, review of systems are positive for chronic MS complaints from the RA, peripheral neuropathy symptoms in her feet. She takes high-dose ibuprofen regularly, so far with no GI upset. All other systems are reviewed and negative.    PHYSICAL EXAM: VS:  LMP 10/28/2013 , BMI There is no weight on file to calculate BMI. GEN: Well nourished, well developed, in no acute distress HEENT: normal, but poor dentition Neck: JVP 8-9 cm, no carotid bruits, or masses Cardiac: Irreg irreg; no murmurs, rubs, or gallops, 1+ LE edema  Respiratory: decreased BS bases with few rales, normal work of  breathing GI: soft, nontender, nondistended, + BS MS: no deformity or atrophy Skin: warm and dry, no rash Neuro:  Strength and sensation are intact Psych: euthymic mood, full affect   EKG:   The ECG from ER 02/09 shows atrial fib, controlled rate, no acute ischemic changes  Recent Labs: 12/17/2014: B Natriuretic Peptide 133.9* 12/28/2014: ALT 16; BUN 17; Creatinine 0.68; Hemoglobin 11.1*; Platelets 391; Potassium 3.8; Sodium 136   Lipid Panel    Component Value Date/Time   CHOL  01/24/2008 0330    155        ATP III CLASSIFICATION:  <200     mg/dL   Desirable  200-239  mg/dL   Borderline High  >=240    mg/dL   High   TRIG 141 01/24/2008 0330   HDL 30* 01/24/2008 0330   CHOLHDL 5.2 01/24/2008 0330   VLDL 28 01/24/2008 0330   LDLCALC  01/24/2008 0330    97        Total Cholesterol/HDL:CHD Risk Coronary Heart Disease Risk Table                     Men   Women  1/2 Average Risk   3.4   3.3    Wt Readings from Last 3 Encounters:  03/31/15 245 lb (111.131 kg)  01/16/15 236 lb (107.049 kg)  01/13/15 236 lb (107.049 kg)     Other studies Reviewed: Additional studies/ records that were reviewed today include: previous echo, recent labs/CXR. Review of the above records demonstrates: normal renal function, elevated CBG   ASSESSMENT AND PLAN:  1.  Acute on chronic diastolic CHF (congestive heart failure), NYHA class 3 - Patient still with some volume overload. Her weight is up in the system, but the last weight is > 1 yr ago  - Will increase her Lasix to BID x 4 days to improve volume status  - She needs to be on daily Lasix for better volume/BP control, 20 mg daily - K+ was borderline yesterday in ER, will give her Kdur 20 meq x 4 days, while on BID Lasix - daily weights - f/u w/ PN in 3-4 weeks w/ BMET  - get echocardiogram at next office visit  Persistent atrial fibrillation - rate control OK on current rx, no change in BB - anticoagulation indicated This patients  CHA2DS2-VASc Score and unadjusted Ischemic Stroke Rate (% per year) is equal to 4.8 % stroke rate/year from a score of 4  Above score calculated as 1 point each if present [CHF, HTN, DM, Vascular=MI/PAD/Aortic Plaque, Age if 65-74, or Female] Above score calculated as 2 points each if present [Age > 75, or Stroke/TIA/TE]  Current medicines  are reviewed at length with the patient today.  The patient does not have concerns regarding medicines.  The following changes have been made:  Xarelto 20 mg daily, coupon and rx given for 1 month, Lasix 20 mg bid x 4 days, then 20 mg qd, Kdur 20 meq qd x 4 days then stop. Decrease use of Ibuprofen as much as possible.  Labs/ tests ordered today include: none   No orders of the defined types were placed in this encounter.    Disposition:   FU with Dr Johnsie Cancel in 3 weeks with BMET and echo  Dr Johnsie Cancel was in to see the patient, plan for anticoagulation and improved volume status was reviewed and he is in agreement.  Signed, Jenkins Rouge, MD  04/06/2015 12:46 PM    [address] [place], [place], [place]  [address] Phone: [phone]; Fax: [phone]

## 2015-04-08 ENCOUNTER — Encounter: Payer: Medicaid Other | Admitting: Cardiovascular Disease

## 2015-04-09 DIAGNOSIS — M159 Polyosteoarthritis, unspecified: Secondary | ICD-10-CM | POA: Insufficient documentation

## 2015-04-09 DIAGNOSIS — M1A09X1 Idiopathic chronic gout, multiple sites, with tophus (tophi): Secondary | ICD-10-CM | POA: Insufficient documentation

## 2015-04-09 HISTORY — DX: Idiopathic chronic gout, multiple sites, with tophus (tophi): M1A.09X1

## 2015-04-10 ENCOUNTER — Encounter: Payer: Self-pay | Admitting: Physician Assistant

## 2015-04-10 ENCOUNTER — Ambulatory Visit (INDEPENDENT_AMBULATORY_CARE_PROVIDER_SITE_OTHER)
Admission: RE | Admit: 2015-04-10 | Discharge: 2015-04-10 | Disposition: A | Discharge status: Home / Self Care | Payer: Medicaid Other | Source: Ambulatory Visit | Attending: Physician Assistant | Admitting: Physician Assistant

## 2015-04-10 ENCOUNTER — Ambulatory Visit (INDEPENDENT_AMBULATORY_CARE_PROVIDER_SITE_OTHER): Payer: Medicaid Other | Admitting: Physician Assistant

## 2015-04-10 VITALS — BP 120/70 | HR 97 | Ht 62.75 in | Wt 240.0 lb

## 2015-04-10 DIAGNOSIS — I5032 Chronic diastolic (congestive) heart failure: Secondary | ICD-10-CM

## 2015-04-10 DIAGNOSIS — R0602 Shortness of breath: Secondary | ICD-10-CM

## 2015-04-10 DIAGNOSIS — I10 Essential (primary) hypertension: Secondary | ICD-10-CM | POA: Diagnosis not present

## 2015-04-10 DIAGNOSIS — E118 Type 2 diabetes mellitus with unspecified complications: Secondary | ICD-10-CM | POA: Diagnosis not present

## 2015-04-10 DIAGNOSIS — I482 Chronic atrial fibrillation, unspecified: Secondary | ICD-10-CM

## 2015-04-10 DIAGNOSIS — H538 Other visual disturbances: Secondary | ICD-10-CM | POA: Diagnosis not present

## 2015-04-10 LAB — BASIC METABOLIC PANEL
BUN: 15 mg/dL (ref 6–23)
CALCIUM: 9.5 mg/dL (ref 8.4–10.5)
CO2: 32 mEq/L (ref 19–32)
CREATININE: 0.74 mg/dL (ref 0.40–1.20)
Chloride: 104 mEq/L (ref 96–112)
GFR: 104.37 mL/min (ref >60.00)
Glucose, Bld: 65 mg/dL — ABNORMAL LOW (ref 70–99)
POTASSIUM: 3.6 meq/L (ref 3.5–5.1)
SODIUM: 143 meq/L (ref 135–145)

## 2015-04-10 LAB — CBC WITH DIFFERENTIAL/PLATELET
BASOS PCT: 0.2 % (ref 0.0–3.0)
Basophils Absolute: 0 10*3/uL (ref 0.0–0.1)
Eosinophils Absolute: 0.1 10*3/uL (ref 0.0–0.7)
Eosinophils Relative: 1.7 % (ref 0.0–5.0)
HEMATOCRIT: 31.6 % — AB (ref 36.0–46.0)
HEMOGLOBIN: 10 g/dL — AB (ref 12.0–15.0)
Lymphocytes Relative: 30.2 % (ref 12.0–46.0)
Lymphs Abs: 2.6 10*3/uL (ref 0.7–4.0)
MCHC: 31.5 g/dL (ref 30.0–36.0)
MCV: 86.4 fl (ref 78.0–100.0)
Monocytes Absolute: 0.4 10*3/uL (ref 0.1–1.0)
Monocytes Relative: 4.2 % (ref 3.0–12.0)
NEUTROS PCT: 63.7 % (ref 43.0–77.0)
Neutro Abs: 5.5 10*3/uL (ref 1.4–7.7)
Platelets: 402 10*3/uL — ABNORMAL HIGH (ref 150.0–400.0)
RBC: 3.66 Mil/uL — ABNORMAL LOW (ref 3.87–5.11)
RDW: 18.1 % — ABNORMAL HIGH (ref 11.5–15.5)
WBC: 8.6 10*3/uL (ref 4.0–10.5)

## 2015-04-10 LAB — BRAIN NATRIURETIC PEPTIDE: Pro B Natriuretic peptide (BNP): 274 pg/mL — ABNORMAL HIGH (ref 0.0–100.0)

## 2015-04-10 MED ORDER — APIXABAN 5 MG PO TABS: 5.0000 mg | ORAL_TABLET | Freq: Two times a day (BID) | ORAL | Status: DC

## 2015-04-10 NOTE — Progress Notes (Signed)
Cardiology Office Note   Date:  04/10/2015   ID:  Kathleen Kane, DOB [birth year], MRN [id number]  PCP:  Philis Fendt, MD  Cardiologist:  Dr. Jenkins Rouge     Chief Complaint  Patient presents with  . Congestive Heart Failure  . Atrial Fibrillation     History of Present Illness: Kathleen Kane is a 54 y.o. female with a hx of chronic A. fib, diastolic HF, OSA (does not use CPAP), HTN, DM, tobacco abuse. She has had poor adherence to medications in the past. Dr. Johnsie Cancel has not placed on anticoagulation due to issues with adherence and finances. Patient was last seen in this office by Rosaria Ferries, PA-C.  She was volume overloaded and her Lasix was adjusted. She was also placed on Xarelto as she had Medicaid and ability to afford NOACs.  Echocardiogram demonstrated normal LV function, moderate LAE, mild MR, PASP 33 mmHg. She was to FU in 4 weeks.  This is her first visit back.   Patient is here with her husband. She arrives in a wheelchair. She is on methotrexate and plaquenil for rheumatoid arthritis. She notes significant weakness. She also notes fatigue. She has chronic dyspnea with exertion. She is NYHA 2b. She sleeps on an incline. This is fairly chronic. She does admit to PND. She notes LE edema that is stable without change. She has occasional atypical chest pain. This is unrelated to activity, meals, positional changes. She denies syncope. She denies any significant weight change. She stopped taking Xarelto several months ago because of commercials on TV.  She tells me that she has been getting several calls from lawyers since she started it. She is concerned that the medication has been killing people. Of note, when I walk into the room, she starts to notice that her right eye has blurred vision. She denies symptoms consistent with amaurosis fugax. She denies unilateral weakness, facial droop, speech difficulty.   Studies/Reports Reviewed Today:  Echo 12/24/14 - EF 50% to 55%.  Diffuse hypokinesis.Although no diagnostic regional wall motion abnormality was identified, this possibility cannot be completely excluded on thebasis of this study. - Mitral valve: There was mild regurgitation directed posteriorly. - Left atrium: The atrium was moderately dilated. - Pulmonary arteries: Systolic pressure was mildly increased. PApeak pressure: 33 mm Hg (S). Impressions:- Compared to the prior study, there has been no significantinterval change.  Myoview 4/14 EF 61% Apical thinning, no scar or ischemia; normal study    Past Medical History  Diagnosis Date  . Atrial fibrillation, persistent   . CHF (congestive heart failure) 2013    No echo found from that time, most likely diastolic  . Diabetes mellitus without complication   . Hypertension   . Depression   . Anxiety   . Rheumatoid arthritis   . Obesity     Past Surgical History  Procedure Laterality Date  . Cesarean section      x2  . Left breast cyst    . Laparotomy Right 02/27/2013    Procedure: EXPLORATORY LAPAROTOMY, right salpingo-oopherectomy;  Surgeon: [name], MD;  Location: [facility];  Service: Gynecology;  Laterality: Right;  . Salpingoophorectomy Right 02/27/2013    Procedure: SALPINGO OOPHORECTOMY;  Surgeon: [name], MD;  Location: [facility];  Service: Gynecology;  Laterality: Right;     Current Outpatient Prescriptions  Medication Sig Dispense Refill  . calcium-vitamin D (OSCAL WITH D) 500-200 MG-UNIT TABS   3  . folic acid (FOLVITE) 1 MG tablet Take 1  mg by mouth every morning.     . furosemide (LASIX) 40 MG tablet   2  . hydroxychloroquine (PLAQUENIL) 200 MG tablet Take 200 mg by mouth 2 (two) times daily.   3  . metFORMIN (GLUCOPHAGE) 500 MG tablet Take 500 mg by mouth 2 (two) times daily.     . methotrexate (RHEUMATREX) 2.5 MG tablet Take 10 mg by mouth every Monday. Caution:Chemotherapy. Protect from light.    . metoprolol tartrate (LOPRESSOR) 25 MG tablet Take 0.5 tablets  (12.5 mg total) by mouth 2 (two) times daily. 180 tablet 3  . NOVOLOG MIX 70/30 FLEXPEN (70-30) 100 UNIT/ML FlexPen   5  . predniSONE (DELTASONE) 5 MG tablet   0  . apixaban (ELIQUIS) 5 MG TABS tablet Take 1 tablet (5 mg total) by mouth 2 (two) times daily. 60 tablet 5   No current facility-administered medications for this visit.    Allergies:   Ace inhibitors and Lisinopril    Social History:  The patient  reports that she quit smoking about 10 months ago. Her smoking use included Cigarettes. She has a 18 pack-year smoking history. She has never used smokeless tobacco. She reports that she does not drink alcohol or use illicit drugs.   Family History:  The patient's family history includes Diabetes in her other.    ROS:   Please see the history of present illness.   Review of Systems  Constitution: Positive for malaise/fatigue.  HENT: Positive for hearing loss.   Eyes: Positive for visual disturbance.  Cardiovascular: Positive for chest pain, dyspnea on exertion, irregular heartbeat, leg swelling, orthopnea and paroxysmal nocturnal dyspnea.  Respiratory: Positive for cough.   Hematologic/Lymphatic: Bruises/bleeds easily.  Musculoskeletal: Positive for joint swelling.  Gastrointestinal: Positive for abdominal pain.  Neurological: Positive for dizziness and loss of balance.  Psychiatric/Behavioral: Positive for depression. The patient is nervous/anxious.   All other systems reviewed and are negative.    PHYSICAL EXAM: VS:  BP 120/70 mmHg  Pulse 97  Ht 5' 2.75" (1.594 m)  Wt 240 lb (108.863 kg)  BMI 42.85 kg/m2  LMP 10/28/2013    Wt Readings from Last 3 Encounters:  04/10/15 240 lb (108.863 kg)  03/31/15 245 lb (111.131 kg)  01/16/15 236 lb (107.049 kg)     GEN: Chronically ill-appearing female sitting in a wheelchair in no acute distress HEENT: normal Neck: no JVD at 90 degrees, no masses Cardiac:  Normal S1/S2, irreg irreg rhythm; no murmur ,  no rubs or gallops,  very trace bilateral LE edema   Respiratory:  clear to auscultation bilaterally, no wheezing, rhonchi or rales. GI: soft, nontender, nondistended, + BS MS: no deformity or atrophy Skin: warm and dry  Neuro:  CNs II-XII intact, Strength and sensation are intact Psych: Normal affect   EKG:  EKG is ordered today.  It demonstrates:   AFib, HR 97   Recent Labs: 12/17/2014: B Natriuretic Peptide 133.9* 12/28/2014: ALT 16 04/10/2015: BUN 15; Creatinine 0.74; Hemoglobin 10.0*; Platelets 402.0*; Potassium 3.6; Pro B Natriuretic peptide (BNP) 274.0*; Sodium 143    Lipid Panel    Component Value Date/Time   CHOL  01/24/2008 0330    155        ATP III CLASSIFICATION:  <200     mg/dL   Desirable  200-239  mg/dL   Borderline High  >=240    mg/dL   High   TRIG 141 01/24/2008 0330   HDL 30* 01/24/2008 0330   CHOLHDL 5.2 01/24/2008  0330   VLDL 28 01/24/2008 0330   LDLCALC  01/24/2008 0330    97        Total Cholesterol/HDL:CHD Risk Coronary Heart Disease Risk Table                     Men   Women  1/2 Average Risk   3.4   3.3      ASSESSMENT AND PLAN:  Chronic atrial fibrillation:  Rate is fairly well controlled.  CHADS2-VASc=3.  She is at significant risk of stroke. She stopped taking Xarelto several months ago due to fears over bleeding. She started to describe blurred vision when I walked into the room. I do not feel that she is having symptoms classic for TIA. However, I cannot completely rule this out. Her vision cleared prior to me leaving the room. I reviewed her case today with Dr. Harrington Challenger (DOD). We decided that the patient should have a head CT without contrast to rule out bleed. As long as the CT does not demonstrate any intracerebral hemorrhage, I will start her on Eliquis 5 mg twice a day for stroke prophylaxis. I had a long discussion with the patient and her husband regarding the benefits of anticoagulation and her increased risk of stroke. She understands this. I explained to her that  I understand her concern with Xarelto. However, I think she needs to consider anticoagulation in general. She is willing to try Eliquis and we have provided samples today. Check BMET, CBC. Arrange follow-up in the anticoagulation clinic.  Chronic diastolic CHF (congestive heart failure):  Her volume appears stable. Continue current dose of Lasix. Obtain BMET, BNP. His BNP is significantly elevated, adjust Lasix.  Essential hypertension:  Controlled.  Type 2 diabetes mellitus with complication:  Follow-up with primary care.  Blurry vision:  As noted, head CT is obtained today. She will also be referred to neurology for further evaluation.   Current medicines are reviewed at length with the patient today.  Concerns regarding medicines are as outlined above.  The following changes have been made:    Start Eliquis 5 mg twice a day.  Labs/ tests ordered today include:  Orders Placed This Encounter  Procedures  . CT Head Wo Contrast  . Basic Metabolic Panel (BMET)  . CBC w/Diff  . B Nat Peptide  . Ambulatory referral to Neurology    Disposition:   FU with Dr. Johnsie Cancel 3-4 weeks.   Signed, Versie Starks, MHS 04/10/2015 6:00 PM    [facility] [address], [place], [place]  [address] Phone: [phone]; Fax: [phone]   Addendum Head CT was negative for bleed. The patient was asked to start Eliquis as noted above. Richardson Dopp, PA-C   04/10/2015 6:08 PM

## 2015-04-10 NOTE — Patient Instructions (Addendum)
Medication Instructions:  Start taking Eliquis 5 mg Twice daily - this is a blood thinner to prevent strokes.  Try to take it every 12 hours.     Labwork: TODAY BMET, CBC W/DIFF, BNP  Testing/Procedures: HEAD CT W/O CONTRAST  Follow-Up: DR. Johnsie Cancel IN 3-4 WEEKS  Any Other Special Instructions Will Be Listed Below (If Applicable). YOU ARE BEING REFERRED TO NEUROLOGY; DX UNILATERAL BLURRY VISION

## 2015-04-11 ENCOUNTER — Telehealth: Payer: Self-pay | Admitting: *Deleted

## 2015-04-11 DIAGNOSIS — I5032 Chronic diastolic (congestive) heart failure: Secondary | ICD-10-CM

## 2015-04-11 DIAGNOSIS — I10 Essential (primary) hypertension: Secondary | ICD-10-CM

## 2015-04-11 DIAGNOSIS — I482 Chronic atrial fibrillation, unspecified: Secondary | ICD-10-CM

## 2015-04-11 MED ORDER — FUROSEMIDE 40 MG PO TABS: 60.0000 mg | ORAL_TABLET | Freq: Every day | ORAL | Status: DC

## 2015-04-11 MED ORDER — POTASSIUM CHLORIDE CRYS ER 20 MEQ PO TBCR: 20.0000 meq | EXTENDED_RELEASE_TABLET | Freq: Every day | ORAL | Status: DC

## 2015-04-11 NOTE — Addendum Note (Signed)
Addended by: Michae Kava on: 04/11/2015 07:14 PM   Modules accepted: Orders

## 2015-04-11 NOTE — Telephone Encounter (Signed)
Pt notified of lab and CT results by phone w/verbal understanding. Pt advised to increase lasix to 60 mg daily, start K+ 10 meq daily, bmet, cbc w/diff, folate, b12, ferritin, tibc 6/10.

## 2015-04-11 NOTE — Telephone Encounter (Signed)
IRON LAB WORK ADDED ON TO ORDER

## 2015-04-18 ENCOUNTER — Other Ambulatory Visit: Payer: Medicaid Other

## 2015-04-28 NOTE — Addendum Note (Signed)
Addended by: Freada Bergeron on: 04/28/2015 05:33 PM   Modules accepted: Orders

## 2015-04-29 ENCOUNTER — Encounter: Payer: Self-pay | Admitting: Cardiovascular Disease

## 2015-05-01 ENCOUNTER — Other Ambulatory Visit: Payer: Medicaid Other

## 2015-05-05 ENCOUNTER — Other Ambulatory Visit: Payer: Self-pay

## 2015-05-06 ENCOUNTER — Ambulatory Visit (INDEPENDENT_AMBULATORY_CARE_PROVIDER_SITE_OTHER): Payer: Medicaid Other | Admitting: Neurology

## 2015-05-06 ENCOUNTER — Encounter: Payer: Self-pay | Admitting: Neurology

## 2015-05-06 ENCOUNTER — Other Ambulatory Visit: Payer: Self-pay | Admitting: *Deleted

## 2015-05-06 VITALS — BP 140/88 | HR 74 | Resp 20 | Ht 62.0 in | Wt 247.4 lb

## 2015-05-06 DIAGNOSIS — I5022 Chronic systolic (congestive) heart failure: Secondary | ICD-10-CM

## 2015-05-06 DIAGNOSIS — H539 Unspecified visual disturbance: Secondary | ICD-10-CM

## 2015-05-06 HISTORY — DX: Morbid (severe) obesity due to excess calories: E66.01

## 2015-05-06 NOTE — Patient Instructions (Addendum)
Check MRI of brain [facility] 05/15/15 4:45 pm  Check carotid doppler Will contact you with results and any other recommendations if needed. Weight loss

## 2015-05-06 NOTE — Progress Notes (Signed)
NEUROLOGY CONSULTATION NOTE  [name] MRN: [id number] DOB: 02-24-1961  Referring provider: Richardson Dopp Primary care provider: [name]  Reason for consult:  Evaluate for TIA  HISTORY OF PRESENT ILLNESS: Kathleen Kane is a 54 year old right-handed woman with chronic atrial fibrillation on AC, diastolic HF, OSA (not on CPAP, type 2 diabetes, hypertension, rheumatoid arthritis, and former smoker who presents for evaluation of stroke.  Records, CT of head and labs reviewed.  She is accompanied by her husband who provides some history.  She has chronic atrial fibrillation but had discontinued Xarelto several months ago because of information stating that it may cause death..  While at the cardiologist's office on 04/10/15, she reported that she woke up that morning with visual disturbance in the right eye.  She described a pink and yellow jagged circle.  There was no associated headache, nausea, photophobia, slurred speech, facial droop or focal numbness or weakness.  It lasted 2 hours and resolved.  She denies vision loss, like a shade coming down.  Her cardiologist ordered a CT of the head performed that day, which was unremarkable.  She has since been started on Eliquis.  She has not had any recurrent spells.  She denies history of similar events or headaches/migraines.   PAST MEDICAL HISTORY: Past Medical History  Diagnosis Date  . Atrial fibrillation, persistent   . CHF (congestive heart failure) 2013    No echo found from that time, most likely diastolic  . Diabetes mellitus without complication   . Hypertension   . Depression   . Anxiety   . Rheumatoid arthritis   . Obesity     PAST SURGICAL HISTORY: Past Surgical History  Procedure Laterality Date  . Cesarean section      x2  . Left breast cyst    . Laparotomy Right 02/27/2013    Procedure: EXPLORATORY LAPAROTOMY, right salpingo-oopherectomy;  Surgeon: [name], MD;  Location: [facility];  Service: Gynecology;   Laterality: Right;  . Salpingoophorectomy Right 02/27/2013    Procedure: SALPINGO OOPHORECTOMY;  Surgeon: [name], MD;  Location: [facility];  Service: Gynecology;  Laterality: Right;    MEDICATIONS: Current Outpatient Prescriptions on File Prior to Visit  Medication Sig Dispense Refill  . apixaban (ELIQUIS) 5 MG TABS tablet Take 1 tablet (5 mg total) by mouth 2 (two) times daily. 60 tablet 5  . calcium-vitamin D (OSCAL WITH D) 500-200 MG-UNIT TABS   3  . folic acid (FOLVITE) 1 MG tablet Take 1 mg by mouth every morning.     . furosemide (LASIX) 40 MG tablet Take 1.5 tablets (60 mg total) by mouth daily. 45 tablet 11  . hydroxychloroquine (PLAQUENIL) 200 MG tablet Take 200 mg by mouth 2 (two) times daily.   3  . metFORMIN (GLUCOPHAGE) 500 MG tablet Take 500 mg by mouth 2 (two) times daily.     . methotrexate (RHEUMATREX) 2.5 MG tablet Take 10 mg by mouth every Monday. Caution:Chemotherapy. Protect from light.    . metoprolol tartrate (LOPRESSOR) 25 MG tablet Take 0.5 tablets (12.5 mg total) by mouth 2 (two) times daily. 180 tablet 3  . NOVOLOG MIX 70/30 FLEXPEN (70-30) 100 UNIT/ML FlexPen   5  . potassium chloride SA (K-DUR,KLOR-CON) 20 MEQ tablet Take 1 tablet (20 mEq total) by mouth daily. 30 tablet 11  . predniSONE (DELTASONE) 5 MG tablet   0   No current facility-administered medications on file prior to visit.    ALLERGIES: Allergies  Allergen  Reactions  . Ace Inhibitors Cough  . Lisinopril Cough    FAMILY HISTORY: Family History  Problem Relation Age of Onset  . Diabetes Other   . Hypertension Mother   . Hypertension Father   . Diabetes Mother   . Diabetes Paternal Grandmother   . Diabetes Sister     SOCIAL HISTORY: History   Social History  . Marital Status: Married    Spouse Name: N/A  . Number of Children: N/A  . Years of Education: N/A   Occupational History  . Not on file.   Social History Main Topics  . Smoking status: Former Smoker -- 0.75  packs/day for 24 years    Types: Cigarettes    Quit date: 06/08/2014  . Smokeless tobacco: Never Used     Comment: smoking cessation info given.   . Alcohol Use: No  . Drug Use: No  . Sexual Activity:    Partners: Male   Other Topics Concern  . Not on file   Social History Narrative    REVIEW OF SYSTEMS: Constitutional: No fevers, chills, or sweats, no generalized fatigue, change in appetite Eyes: No visual changes, double vision, eye pain Ear, nose and throat: No hearing loss, ear pain, nasal congestion, sore throat.  Cardiovascular: No chest pain, palpitations Respiratory:  No shortness of breath GastrointestinaI: No nausea, vomiting, diarrhea, abdominal pain, fecal incontinence Genitourinary:  No dysuria, urinary retention or frequency Musculoskeletal:  No neck pain, back pain Integumentary: No rash, pruritus, skin lesions Neurological: as above Psychiatric: No depression, insomnia, anxiety Endocrine: No palpitations, fatigue, diaphoresis, mood swings, change in appetite, change in weight, increased thirst Hematologic/Lymphatic:  No anemia, purpura, petechiae. Allergic/Immunologic: no itchy/runny eyes, nasal congestion, recent allergic reactions, rashes  PHYSICAL EXAM: Filed Vitals:   05/06/15 0939  BP: 140/88  Pulse: 74  Resp: 20   General: No acute distress Head:  Normocephalic/atraumatic Eyes:  fundi unremarkable, without vessel changes, exudates, hemorrhages or papilledema. Neck: supple, no paraspinal tenderness, full range of motion Back: No paraspinal tenderness Heart: irregular rate and irregular rhythm Lungs: Clear to auscultation bilaterally. Vascular: No carotid bruits. Neurological Exam: Mental status: alert and oriented to person, place, and time, recent and remote memory intact, fund of knowledge intact, attention and concentration intact, speech fluent and not dysarthric, language intact. Cranial nerves: CN I: not tested CN II: pupils equal, round  and reactive to light, visual fields intact, fundi unremarkable, without vessel changes, exudates, hemorrhages or papilledema. CN III, IV, VI:  full range of motion, no nystagmus, no ptosis CN V: facial sensation intact CN VII: upper and lower face symmetric CN VIII: hearing intact CN IX, X: gag intact, uvula midline CN XI: sternocleidomastoid and trapezius muscles intact CN XII: tongue midline Bulk & Tone: normal, no fasciculations. Motor:  5/5 throughout Sensation:  Pinprick intact.  Mildly reduced vibration sensation in feet. Deep Tendon Reflexes:  2+ throughout except absent in ankles.  Toes down. Finger to nose testing:  intact Heel to shin:  intact Gait:  Normal station and stride.  Difficulty with tandem walking. Romberg negative.  IMPRESSION: Transient right visual disturbance.  Semiology sounds more like a visual migraine aura rather than amaurosis fugax.  However, she does not have history of migraine and she does have stroke risk factors.  Therefore we will check some tests to investigate stroke. Morbid obesity  PLAN:  1.  MRI of brain without contrast 2.  Carotid doppler 3.  Weight loss 4.  Recheck BP with PCP 5.  Will call with results and any recommendations if needed.  Otherwise, no follow up necessary.  Thank you for allowing me to take part in the care of this patient.  Metta Clines, DO  CC:  Richardson Dopp, PA-C  [name], MD

## 2015-05-15 ENCOUNTER — Ambulatory Visit (HOSPITAL_BASED_OUTPATIENT_CLINIC_OR_DEPARTMENT_OTHER)
Admission: RE | Admit: 2015-05-15 | Discharge: 2015-05-15 | Disposition: A | Discharge status: Home / Self Care | Payer: Medicaid Other | Source: Ambulatory Visit | Attending: Neurology | Admitting: Neurology

## 2015-05-15 ENCOUNTER — Other Ambulatory Visit: Payer: Self-pay | Admitting: *Deleted

## 2015-05-15 ENCOUNTER — Other Ambulatory Visit (INDEPENDENT_AMBULATORY_CARE_PROVIDER_SITE_OTHER): Payer: Medicaid Other | Admitting: *Deleted

## 2015-05-15 ENCOUNTER — Ambulatory Visit (HOSPITAL_COMMUNITY)
Admission: RE | Admit: 2015-05-15 | Discharge: 2015-05-15 | Disposition: A | Discharge status: Home / Self Care | Payer: Medicaid Other | Source: Ambulatory Visit | Attending: Neurology | Admitting: Neurology

## 2015-05-15 DIAGNOSIS — I5022 Chronic systolic (congestive) heart failure: Secondary | ICD-10-CM | POA: Diagnosis not present

## 2015-05-15 DIAGNOSIS — H539 Unspecified visual disturbance: Secondary | ICD-10-CM

## 2015-05-15 DIAGNOSIS — I5032 Chronic diastolic (congestive) heart failure: Secondary | ICD-10-CM | POA: Diagnosis not present

## 2015-05-15 DIAGNOSIS — I482 Chronic atrial fibrillation, unspecified: Secondary | ICD-10-CM

## 2015-05-15 DIAGNOSIS — I6523 Occlusion and stenosis of bilateral carotid arteries: Secondary | ICD-10-CM | POA: Insufficient documentation

## 2015-05-15 LAB — CBC WITH DIFFERENTIAL/PLATELET
BASOS PCT: 0.4 % (ref 0.0–3.0)
Basophils Absolute: 0 10*3/uL (ref 0.0–0.1)
EOS ABS: 0.2 10*3/uL (ref 0.0–0.7)
EOS PCT: 2.1 % (ref 0.0–5.0)
HCT: 35.2 % — ABNORMAL LOW (ref 36.0–46.0)
HEMOGLOBIN: 11.4 g/dL — AB (ref 12.0–15.0)
LYMPHS PCT: 36.1 % (ref 12.0–46.0)
Lymphs Abs: 2.6 10*3/uL (ref 0.7–4.0)
MCHC: 32.4 g/dL (ref 30.0–36.0)
MCV: 89.9 fl (ref 78.0–100.0)
Monocytes Absolute: 0.4 10*3/uL (ref 0.1–1.0)
Monocytes Relative: 5.5 % (ref 3.0–12.0)
Neutro Abs: 4 10*3/uL (ref 1.4–7.7)
Neutrophils Relative %: 55.9 % (ref 43.0–77.0)
Platelets: 333 10*3/uL (ref 150.0–400.0)
RBC: 3.91 Mil/uL (ref 3.87–5.11)
RDW: 18.7 % — ABNORMAL HIGH (ref 11.5–15.5)
WBC: 7.1 10*3/uL (ref 4.0–10.5)

## 2015-05-15 LAB — BASIC METABOLIC PANEL
BUN: 27 mg/dL — ABNORMAL HIGH (ref 6–23)
CHLORIDE: 104 meq/L (ref 96–112)
CO2: 25 mEq/L (ref 19–32)
Calcium: 10.2 mg/dL (ref 8.4–10.5)
Creatinine, Ser: 0.91 mg/dL (ref 0.40–1.20)
GFR: 82.18 mL/min (ref >60.00)
Glucose, Bld: 120 mg/dL — ABNORMAL HIGH (ref 70–99)
POTASSIUM: 4.2 meq/L (ref 3.5–5.1)
Sodium: 140 mEq/L (ref 135–145)

## 2015-05-15 LAB — VITAMIN B12: Vitamin B-12: 844 pg/mL (ref 211–911)

## 2015-05-15 LAB — FERRITIN: Ferritin: 142.2 ng/mL (ref 10.0–291.0)

## 2015-05-15 LAB — IBC PANEL
IRON: 78 ug/dL (ref 42–145)
Saturation Ratios: 21.4 % (ref 20.0–50.0)
Transferrin: 260 mg/dL (ref 212.0–360.0)

## 2015-05-15 NOTE — Addendum Note (Signed)
Addended by: Eulis Foster on: 05/15/2015 10:04 AM   Modules accepted: Orders

## 2015-05-15 NOTE — Progress Notes (Signed)
VASCULAR LAB PRELIMINARY  PRELIMINARY  PRELIMINARY  PRELIMINARY  Carotid Dopplers completed.    Preliminary report:  1-39% ICA stenosis. Vertebral artery flow is antegrade.   Kathleen Kane, RVT 05/15/2015, 3:43 PM

## 2015-05-16 ENCOUNTER — Telehealth: Payer: Self-pay | Admitting: *Deleted

## 2015-05-16 DIAGNOSIS — I5032 Chronic diastolic (congestive) heart failure: Secondary | ICD-10-CM

## 2015-05-16 NOTE — Telephone Encounter (Signed)
lmptcb to go over lab results with med changes.

## 2015-05-19 ENCOUNTER — Ambulatory Visit (HOSPITAL_COMMUNITY): Admission: RE | Admit: 2015-05-19 | Payer: Medicaid Other | Source: Ambulatory Visit

## 2015-05-19 ENCOUNTER — Telehealth: Payer: Self-pay | Admitting: *Deleted

## 2015-05-19 ENCOUNTER — Other Ambulatory Visit: Payer: Self-pay | Admitting: *Deleted

## 2015-05-19 DIAGNOSIS — F411 Generalized anxiety disorder: Secondary | ICD-10-CM

## 2015-05-19 MED ORDER — FUROSEMIDE 40 MG PO TABS: 40.0000 mg | ORAL_TABLET | Freq: Every day | ORAL | Status: DC

## 2015-05-19 MED ORDER — DIAZEPAM 10 MG PO TABS: 10.0000 mg | ORAL_TABLET | Freq: Once | ORAL | Status: DC

## 2015-05-19 MED ORDER — POTASSIUM CHLORIDE CRYS ER 20 MEQ PO TBCR: 10.0000 meq | EXTENDED_RELEASE_TABLET | Freq: Every day | ORAL | Status: DC

## 2015-05-19 NOTE — Telephone Encounter (Signed)
-----   Message from Pieter Partridge, DO sent at 05/18/2015 11:27 AM EDT ----- Carotid doppler shows no significant narrowing of the carotid arteries

## 2015-05-19 NOTE — Telephone Encounter (Signed)
Pt notified of lab results and to decrease lasix to 40 mg daily; decrease K+ to 10 meq daily. Pt vebalized understanding by phone with verbal read back.

## 2015-05-19 NOTE — Telephone Encounter (Signed)
Patient is aware that Carotid doppler shows no significant narrowing of the carotid arteries

## 2015-05-29 ENCOUNTER — Encounter (HOSPITAL_COMMUNITY): Payer: Self-pay | Admitting: Vascular Surgery

## 2015-05-29 ENCOUNTER — Encounter (HOSPITAL_COMMUNITY): Payer: Self-pay

## 2015-05-29 ENCOUNTER — Inpatient Hospital Stay (HOSPITAL_COMMUNITY)
Admission: RE | Admit: 2015-05-29 | Discharge: 2015-05-29 | Disposition: A | Discharge status: Home / Self Care | Payer: Medicaid Other | Source: Ambulatory Visit

## 2015-05-29 NOTE — Pre-Procedure Instructions (Signed)
    JOLIN BENAVIDES  05/29/2015      [facility] - [place], [place] [address] Phone: [phone] Fax: [phone]  [id number] - [place], [place] [address] [place] [address] Phone: [phone] Fax: [phone]    Your procedure is scheduled on Tuesday, July 26  Report to [facility] Main Entrance "A" at 6:00 A.M.  Call this number if you have problems the morning of surgery:  [phone]   Remember:  Do not eat food or drink liquids after midnight on Monday July 25  Take these medicines the morning of surgery with A SIP OF WATER : allopurinol, eliquis, folic acid, metoprolol, percocet, prednisone, plaquenil   Do not wear jewelry, make-up or nail polish.  Do not wear lotions, powders, or perfumes.  You may wear deodorant.  Do not shave 48 hours prior to surgery.  Men may shave face and neck.  Do not bring valuables to the hospital.  [facility] is not responsible for any belongings or valuables.  Contacts, dentures or bridgework may not be worn into surgery.  Leave your suitcase in the car.  After surgery it may be brought to your room.  For patients admitted to the hospital, discharge time will be determined by your treatment team.  Patients discharged the day of surgery will not be allowed to drive home.   Name and phone number of your driver:      Please read over the following fact sheets that you were given. Pain Booklet and Coughing and Deep Breathing

## 2015-05-30 NOTE — Progress Notes (Signed)
Anesthesia Chart Review: Patient is a 54 year old female scheduled for MRI of the brain under anesthesia care on 06/03/15.  She missed her PAT appointment, and there were no other appointments available. MRI was ordered by neurologist Dr. Tomi Likens. H&P is from 05/06/15.  History includes chronic afib, CHF, DM2, RA, anxiety, depression, HTN. BMI is consistent with morbid obesity. PCP is Dr. [name]. Cardiologist is Dr. Jenkins Rouge. Last cardiology visit 04/10/15 with Richardson Dopp, PA-C. She was reported blurred vision at that appointment that resolved prior to her leaving. As a precaution head CT was done which was negative. She had previously stopped her Xarelto due to fear of bleeding. With her chronic afib history and increased risk for CVA, she was started on Eliquis. She was also referred to neurology. Dr. Tomi Likens thought symptoms sounded more typical of visual migraine rather than amaurosis fugax. Carotid duplex was okay. MRI was ordered to further evaluate.   Meds include allopurinol, Eliquis, Lasix, Plaquenil, metformin, methotrexate, Lopressor, Novolog 70/30, Percocet, KCl, prednisone.  04/10/15 EKG: Afib at 97 bpm, non-specific T wave abnormality, prolonged QT.  12/24/14 Echo: Study Conclusions - Left ventricle: The cavity size was normal. Wall thickness wasnormal. Systolic function was normal. The estimated ejectionfraction was in the range of 50% to 55%. Diffuse hypokinesis.Although no diagnostic regional wall motion abnormality wasidentified, this possibility cannot be completely excluded on thebasis of this study. - Mitral valve: There was mild regurgitation directed posteriorly. - Left atrium: The atrium was moderately dilated. - Pulmonary arteries: Systolic pressure was mildly increased. PApeak pressure: 33 mm Hg (S). Impressions: Compared to the prior study, there has been no significantinterval change.  02/16/13 Nuclear stress test: Overall Impression: Normal stress nuclear study.  Low risk stress nuclear study.  05/15/15 Carotid duplex: Summary: Bilateral: soft plaque noted CCA and origin ICA. 1-39% ICA stenosis. Vertebral artery flow is antegrade.  03/31/15 CXR: No acute cardiopulmonary disease.  04/10/15 Head CT: IMPRESSION: Normal noncontrast CT appearance of the brain.  She will get labs on arrival. Further evaluation by her anesthesiologist at that time. She has known chronic afib with recent cardiology follow-up.  If labs are acceptable and no acute changes in her CV status then I would anticipate that she could proceed as planned.  George Hugh [facility] Short Stay Center/Anesthesiology Phone [phone] 05/30/2015 1:06 PM

## 2015-06-03 ENCOUNTER — Ambulatory Visit (HOSPITAL_COMMUNITY): Admission: RE | Admit: 2015-06-03 | Payer: Medicaid Other | Source: Ambulatory Visit

## 2015-06-03 ENCOUNTER — Encounter (HOSPITAL_COMMUNITY): Admission: RE | Payer: Self-pay | Source: Ambulatory Visit

## 2015-06-03 ENCOUNTER — Ambulatory Visit (HOSPITAL_COMMUNITY): Admission: RE | Admit: 2015-06-03 | Payer: Medicaid Other | Source: Ambulatory Visit | Admitting: Neurology

## 2015-06-03 SURGERY — RADIOLOGY WITH ANESTHESIA
Anesthesia: General

## 2015-06-27 ENCOUNTER — Ambulatory Visit: Payer: Medicaid Other | Admitting: Cardiovascular Disease

## 2015-07-03 ENCOUNTER — Encounter: Payer: Self-pay | Admitting: Cardiovascular Disease

## 2015-07-07 NOTE — Progress Notes (Signed)
Patient ID: Kathleen Kane, female   DOB: 04-Jan-1961, 54 y.o.   MRN: [id number]    Cardiology Office Note   Date:  07/07/2015   ID:  Kathleen Kane, [place] [birth year], MRN [id number]  PCP:  Philis Fendt, MD  Cardiologist:  Dr. Jenkins Rouge     No chief complaint on file.    History of Present Illness: Kathleen Kane is a 54 y.o. female with a hx of chronic A. fib, diastolic HF, OSA (does not use CPAP), HTN, DM, tobacco abuse. She has had poor adherence to medications in the past. Dr. Johnsie Cancel has not placed on anticoagulation due to issues with adherence and finances. Patient was last seen in this office by Rosaria Ferries, PA-C.  She was volume overloaded and her Lasix was adjusted. She was also placed on Xarelto as she had Medicaid and ability to afford NOACs.  Echocardiogram demonstrated normal LV function, moderate LAE, mild MR, PASP 33 mmHg. She was to FU in 4 weeks.  This is her first visit back.    She arrives in a wheelchair. She is on methotrexate and plaquenil for rheumatoid arthritis. She notes significant weakness. She also notes fatigue. She has chronic dyspnea with exertion. She is NYHA 2b. She sleeps on an incline. This is fairly chronic. She does admit to PND. She notes LE edema that is stable without change. She has occasional atypical chest pain. This is unrelated to activity, meals, positional changes. She denies syncope. She denies any significant weight change. She stopped taking Xarelto several months ago because of commercials on TV.  She tells me that she has been getting several calls from lawyers since she started it. She is concerned that the medication has been killing people. Of note, when I walk into the room, she starts to notice that her right eye has blurred vision. She denies symptoms consistent with amaurosis fugax. She denies unilateral weakness, facial droop, speech difficulty.   Studies/Reports Reviewed Today:  Echo 12/24/14 - EF 50% to 55%. Diffuse  hypokinesis.Although no diagnostic regional wall motion abnormality was identified, this possibility cannot be completely excluded on thebasis of this study. - Mitral valve: There was mild regurgitation directed posteriorly. - Left atrium: The atrium was moderately dilated. - Pulmonary arteries: Systolic pressure was mildly increased. PApeak pressure: 33 mm Hg (S). Impressions:- Compared to the prior study, there has been no significantinterval change.  Myoview 4/14 EF 61% Apical thinning, no scar or ischemia; normal study    Past Medical History  Diagnosis Date  . Atrial fibrillation, persistent   . CHF (congestive heart failure) 2013    No echo found from that time, most likely diastolic  . Diabetes mellitus without complication   . Hypertension   . Depression   . Anxiety   . Rheumatoid arthritis   . Obesity   . Obstructive sleep apnea     Sleep study performed 10/18/2008. AHI-6.8/hr, during REM-27.3/hr. RDI-25.0/hr, during REM-40.9/hr. avg o2 sat during REM and NREM 95%    Past Surgical History  Procedure Laterality Date  . Cesarean section      x2  . Left breast cyst    . Laparotomy Right 02/27/2013    Procedure: EXPLORATORY LAPAROTOMY, right salpingo-oopherectomy;  Surgeon: [name], MD;  Location: [facility];  Service: Gynecology;  Laterality: Right;  . Salpingoophorectomy Right 02/27/2013    Procedure: SALPINGO OOPHORECTOMY;  Surgeon: [name], MD;  Location: [facility];  Service: Gynecology;  Laterality: Right;  . Cardiovascular stress test  02/16/2013    no significant EKG changes with Lexiscan, normal LV function and normal wall function  . Doppler echocardiography  01/24/2008    EF 55-60%, no diagnostic evidence of LV wall motion abnormalities, LV wall thickness was mild-moderately increased.     Current Outpatient Prescriptions  Medication Sig Dispense Refill  . allopurinol (ZYLOPRIM) 300 MG tablet TAKE 1 TABLET (300 MG TOTAL) BY MOUTH DAILY.  2  .  apixaban (ELIQUIS) 5 MG TABS tablet Take 1 tablet (5 mg total) by mouth 2 (two) times daily. 60 tablet 5  . B Complex-C (B-COMPLEX WITH VITAMIN C) tablet Take 1 tablet by mouth daily.    . calcium-vitamin D (OSCAL WITH D) 500-200 MG-UNIT TABS Take 1 tablet by mouth daily.   3  . folic acid (FOLVITE) 1 MG tablet Take 1 mg by mouth every morning.     . furosemide (LASIX) 40 MG tablet Take 1 tablet (40 mg total) by mouth daily.    . hydroxychloroquine (PLAQUENIL) 200 MG tablet Take 200 mg by mouth 2 (two) times daily.   3  . metFORMIN (GLUCOPHAGE) 500 MG tablet Take 500 mg by mouth 2 (two) times daily.     . methotrexate (RHEUMATREX) 2.5 MG tablet Take 10 mg by mouth every Monday. Caution:Chemotherapy. Protect from light.    . metoprolol tartrate (LOPRESSOR) 25 MG tablet Take 0.5 tablets (12.5 mg total) by mouth 2 (two) times daily. 180 tablet 3  . NOVOLOG MIX 70/30 FLEXPEN (70-30) 100 UNIT/ML FlexPen Inject 20-40 Units into the skin 2 (two) times daily with a meal. 20 units in the morning, 40 units in the evening  5  . OVER THE COUNTER MEDICATION Take 1 Dose by mouth daily. Trace minerals    . oxyCODONE-acetaminophen (PERCOCET/ROXICET) 5-325 MG per tablet TAKE 1 TABLET BY MOUTH EVERY 6 HOURS AS NEEDED FOR SEVERE PAIN  0  . potassium chloride SA (K-DUR,KLOR-CON) 20 MEQ tablet Take 0.5 tablets (10 mEq total) by mouth daily.    . predniSONE (DELTASONE) 5 MG tablet Take 10 mg by mouth daily.   0   No current facility-administered medications for this visit.    Allergies:   Ace inhibitors and Lisinopril    Social History:  The patient  reports that she quit smoking about 12 months ago. Her smoking use included Cigarettes. She has a 18 pack-year smoking history. She has never used smokeless tobacco. She reports that she does not drink alcohol or use illicit drugs.   Family History:  The patient's family history includes Diabetes in her brother, mother, other, paternal grandmother, and sister; Heart  Problems in her sister; Hypertension in her brother, father, and mother; Kidney failure in her sister.    ROS:   Please see the history of present illness.   Review of Systems  Constitution: Positive for malaise/fatigue.  HENT: Positive for hearing loss.   Eyes: Positive for visual disturbance.  Cardiovascular: Positive for chest pain, dyspnea on exertion, irregular heartbeat, leg swelling, orthopnea and paroxysmal nocturnal dyspnea.  Respiratory: Positive for cough.   Hematologic/Lymphatic: Bruises/bleeds easily.  Musculoskeletal: Positive for joint swelling.  Gastrointestinal: Positive for abdominal pain.  Neurological: Positive for dizziness and loss of balance.  Psychiatric/Behavioral: Positive for depression. The patient is nervous/anxious.   All other systems reviewed and are negative.    PHYSICAL EXAM: VS:  LMP 10/28/2013    Wt Readings from Last 3 Encounters:  05/29/15 113.399 kg (250 lb)  05/06/15 112.22 kg (247 lb 6.4 oz)  04/10/15 108.863 kg (240 lb)     GEN: Chronically ill-appearing female sitting in a wheelchair in no acute distress HEENT: normal Neck: no JVD at 90 degrees, no masses Cardiac:  Normal S1/S2, irreg irreg rhythm; no murmur ,  no rubs or gallops, very trace bilateral LE edema   Respiratory:  clear to auscultation bilaterally, no wheezing, rhonchi or rales. GI: soft, nontender, nondistended, + BS MS: no deformity or atrophy Skin: warm and dry  Neuro:  CNs II-XII intact, Strength and sensation are intact Psych: Normal affect   EKG:  EKG is ordered today.  It demonstrates:   AFib, HR 97   Recent Labs: 12/17/2014: B Natriuretic Peptide 133.9* 12/28/2014: ALT 16 04/10/2015: Pro B Natriuretic peptide (BNP) 274.0* 05/15/2015: BUN 27*; Creatinine, Ser 0.91; Hemoglobin 11.4*; Platelets 333.0; Potassium 4.2; Sodium 140    Lipid Panel    Component Value Date/Time   CHOL  01/24/2008 0330    155        ATP III CLASSIFICATION:  <200     mg/dL    Desirable  200-239  mg/dL   Borderline High  >=240    mg/dL   High   TRIG 141 01/24/2008 0330   HDL 30* 01/24/2008 0330   CHOLHDL 5.2 01/24/2008 0330   VLDL 28 01/24/2008 0330   LDLCALC  01/24/2008 0330    97        Total Cholesterol/HDL:CHD Risk Coronary Heart Disease Risk Table                     Men   Women  1/2 Average Risk   3.4   3.3      ASSESSMENT AND PLAN:  Chronic atrial fibrillation:  Compliance issues Cr ok on Eliquis    Chronic diastolic CHF (congestive heart failure):  Her volume appears stable. Continue current dose of Lasix. Obtain BMET, BNP. His BNP is significantly elevated, adjust Lasix.  Essential hypertension:  Controlled.  Type 2 diabetes mellitus with complication:  Follow-up with primary care.  Blurry vision:  Resolved CT negative for acute findings in June 2/16  Seeing [facility] MRI ordered    Current medicines are reviewed at length with the patient today.  Concerns regarding medicines are as outlined above.  The following changes have been made:      Labs/ tests ordered today include:  No orders of the defined types were placed in this encounter.    Disposition:   FU with me 6 months    Jenkins Rouge

## 2015-07-08 ENCOUNTER — Encounter: Payer: Medicaid Other | Admitting: Cardiovascular Disease

## 2015-07-18 ENCOUNTER — Telehealth: Payer: Self-pay | Admitting: *Deleted

## 2015-07-18 NOTE — Telephone Encounter (Signed)
Pt came to office today, accompanied by her husband thinking she had appointment with Dr. Johnsie Cancel. She actually is scheduled Nov 9.  She requested to see someone due to SOB, edema, unable to sleep.  I talked with patient.  She has chronic heart failure and is taking prednisone for her rheumatoid arthritis. She has all of her medications and has been compliant. She watches the salt intake in her diet. She does not appear SOB, seated in wheelchair, talking without labored respirations.  She is sleeping sitting up. Feels like she cant get a deep breath so is afraid to lie down because she may stop breathing. She also asked if she could get something for pain because since her steroids have been decreased she hurts constantly. "every time they take my prednisone down below 10 mg I hurt".  She is currently on 7.5 mg tab prednisone.  Pt is scheduled to see rheumatology in 2 months. Advised her to contact them regarding any dose adjustments/pain management.  Pt is aware I am forwarding to Dr. Johnsie Cancel and his primary nurse.  She is aware that if they have any further recommendations or are able to find a sooner appointment someone will call her.

## 2015-07-18 NOTE — Telephone Encounter (Signed)
Added to FLEX PA schedule on Thursday 9/15.   Dr. Johnsie Cancel is DOD. Called patient to inform of appointment. No answer. Left message to call back.

## 2015-07-18 NOTE — Telephone Encounter (Signed)
Try to fit in with flex PA next week

## 2015-07-24 ENCOUNTER — Encounter: Payer: Self-pay | Admitting: Physician Assistant

## 2015-07-24 ENCOUNTER — Ambulatory Visit (INDEPENDENT_AMBULATORY_CARE_PROVIDER_SITE_OTHER): Payer: Medicaid Other | Admitting: Physician Assistant

## 2015-07-24 VITALS — BP 120/88 | HR 96 | Ht 62.0 in | Wt 250.6 lb

## 2015-07-24 DIAGNOSIS — I5032 Chronic diastolic (congestive) heart failure: Secondary | ICD-10-CM | POA: Diagnosis not present

## 2015-07-24 DIAGNOSIS — I10 Essential (primary) hypertension: Secondary | ICD-10-CM

## 2015-07-24 DIAGNOSIS — I482 Chronic atrial fibrillation, unspecified: Secondary | ICD-10-CM

## 2015-07-24 LAB — BASIC METABOLIC PANEL
BUN: 17 mg/dL (ref 6–23)
CALCIUM: 9.5 mg/dL (ref 8.4–10.5)
CO2: 24 meq/L (ref 19–32)
CREATININE: 0.64 mg/dL (ref 0.40–1.20)
Chloride: 108 mEq/L (ref 96–112)
GFR: 123.28 mL/min (ref >60.00)
Glucose, Bld: 177 mg/dL — ABNORMAL HIGH (ref 70–99)
Potassium: 4.3 mEq/L (ref 3.5–5.1)
SODIUM: 140 meq/L (ref 135–145)

## 2015-07-24 LAB — BRAIN NATRIURETIC PEPTIDE: PRO B NATRI PEPTIDE: 216 pg/mL — AB (ref 0.0–100.0)

## 2015-07-24 MED ORDER — FUROSEMIDE 40 MG PO TABS: 40.0000 mg | ORAL_TABLET | Freq: Two times a day (BID) | ORAL | Status: DC

## 2015-07-24 MED ORDER — POTASSIUM CHLORIDE CRYS ER 20 MEQ PO TBCR: 10.0000 meq | EXTENDED_RELEASE_TABLET | Freq: Two times a day (BID) | ORAL | Status: DC

## 2015-07-24 NOTE — Patient Instructions (Signed)
Medication Instructions:  Your physician has recommended you make the following change in your medication:  1.  Increase the Lasix (Furosemide) to 40 mgs take 1 tablet 2 times a day for 4 days 2.  Increase the Postassium to 20 meq taking 1/2 tablets 2 times a day for 4 days.   Labwork: TODAY:  BMET & BNP Monday, 07-31-15 BMET  Testing/Procedures: None ordered  Follow-Up: Your physician recommends that you schedule a follow-up appointment in: [place], PA-C.   Any Other Special Instructions Will Be Listed Below (If Applicable).

## 2015-07-24 NOTE — Progress Notes (Signed)
Cardiology Office Note    Date:  07/24/2015   ID:  Lakeita, Panther Nov 25, 1960, MRN [id number]  PCP:  Philis Fendt, MD  Cardiologist:  Dr. Jenkins Rouge    History of Present Illness: Kathleen Kane is a 54 y.o. female with a hx of chronic A. fib, diastolic HF, OSA (does not use CPAP), HTN, DM, RA, obesity and previous tobacco abuse who presents for evaluation of dyspnea, LE edema and orthopnea.  She has had poor adherence to medications in the past. Dr. Johnsie Cancel has not placed on anticoagulation due to issues with adherence and finances. Patient was seen in this office by Rosaria Ferries, PA-C in 12/2014. She was volume overloaded and her Lasix was adjusted. She was also placed on Xarelto as she had Medicaid and ability to afford NOACs. Echocardiogram demonstrated normal LV function, moderate LAE, mild MR, PASP 33 mmHg. She is on prednisone, methotrexate and plaquenil for rheumatoid arthritis. She has chronic atrial fibrillation but had discontinued Xarelto several months ago because of information stating that it may cause death. She was seen by Richardson Dopp PA-C on 04/10/15 and reported that she woke up that morning with visual disturbance in the right eye. She described a pink and yellow jagged circle. CT of the head was performed that day, which was unremarkable. She has since been started on Eliquis. She was later seen by neurology who felt her sx were more consistent with visual migraine aura.   Today she was added on to my schedule for evaluation of a reported 30 lb weight gain, LE edema, SOB , orthopnea and PND. She has been complaint with all of her medications and watches the salt intake in her diet. She is sleeping sitting up and feels like she cant get a deep breath so is afraid to lie down because she may stop breathing. She is up 10 lbs from 04/10/15 when she was 240 lbs and she 250lbs today.    Studies: Echo 12/24/14 - EF 50% to 55%. Diffuse hypokinesis.Although no  diagnostic regional wall motion abnormality was identified, this possibility cannot be completely excluded on thebasis of this study. - Mitral valve: There was mild regurgitation directed posteriorly. - Left atrium: The atrium was moderately dilated. - Pulmonary arteries: Systolic pressure was mildly increased. PApeak pressure: 33 mm Hg (S). Impressions:- Compared to the prior study, there has been no significantinterval change.  Myoview 4/14 EF 61% Apical thinning, no scar or ischemia; normal study  Caroitd dopplers (05/15/15)- Carotid doppler shows no significant narrowing of the carotid arteries  Recent Labs/Images:   Recent Labs  12/17/14 1105 12/28/14 1349  05/15/15 1004 07/24/15 1229  NA 136 136  < > 140 140  K 3.5 3.8  < > 4.2 4.3  BUN 20 17  < > 27* 17  CREATININE 0.68 0.68  < > 0.91 0.64  ALT  --  16  --   --   --   HGB 12.5 11.1*  < > 11.4*  --   BNP 133.9*  --   --   --   --   PROBNP  --   --   < >  --  216.0*  < > = values in this interval not displayed.   No results found.   Wt Readings from Last 3 Encounters:  07/24/15 250 lb 9.6 oz (113.671 kg)  05/29/15 250 lb (113.399 kg)  05/06/15 247 lb 6.4 oz (112.22 kg)     Past Medical History  Diagnosis Date  . Atrial fibrillation, persistent   . CHF (congestive heart failure) 2013    No echo found from that time, most likely diastolic  . Diabetes mellitus without complication   . Hypertension   . Depression   . Anxiety   . Rheumatoid arthritis   . Obesity   . Obstructive sleep apnea     Sleep study performed 10/18/2008. AHI-6.8/hr, during REM-27.3/hr. RDI-25.0/hr, during REM-40.9/hr. avg o2 sat during REM and NREM 95%    Current Outpatient Prescriptions  Medication Sig Dispense Refill  . allopurinol (ZYLOPRIM) 300 MG tablet TAKE 1 TABLET (300 MG TOTAL) BY MOUTH DAILY.  2  . apixaban (ELIQUIS) 5 MG TABS tablet Take 5 mg by mouth 2 (two) times daily.    . B Complex-C (B-COMPLEX WITH VITAMIN C) tablet  Take 1 tablet by mouth daily.    . calcium-vitamin D (OSCAL WITH D) 500-200 MG-UNIT TABS Take 1 tablet by mouth daily.   3  . folic acid (FOLVITE) 1 MG tablet Take 1 mg by mouth every morning.     . furosemide (LASIX) 40 MG tablet Take 1 tablet (40 mg total) by mouth 2 (two) times daily. 30 tablet   . hydroxychloroquine (PLAQUENIL) 200 MG tablet Take 200 mg by mouth 2 (two) times daily.   3  . metFORMIN (GLUCOPHAGE) 500 MG tablet Take 500 mg by mouth 2 (two) times daily.     . methotrexate (RHEUMATREX) 2.5 MG tablet Take 10 mg by mouth every Monday. Caution:Chemotherapy. Protect from light.    . metoprolol tartrate (LOPRESSOR) 25 MG tablet Take 0.5 tablets (12.5 mg total) by mouth 2 (two) times daily. 180 tablet 3  . NOVOLOG MIX 70/30 FLEXPEN (70-30) 100 UNIT/ML FlexPen Inject 20-40 Units into the skin 2 (two) times daily with a meal. 20 units in the morning, 40 units in the evening  5  . OVER THE COUNTER MEDICATION Take 1 Dose by mouth daily. Trace minerals    . potassium chloride SA (K-DUR,KLOR-CON) 20 MEQ tablet Take 0.5 tablets (10 mEq total) by mouth 2 (two) times daily.    . predniSONE (DELTASONE) 5 MG tablet Take 10 mg by mouth daily.   0  . predniSONE (DELTASONE) 5 MG tablet Take 7.5 mg by mouth daily with breakfast.     No current facility-administered medications for this visit.     Allergies:   Ace inhibitors and Lisinopril   Social History:  The patient  reports that she quit smoking about 13 months ago. Her smoking use included Cigarettes. She has a 18 pack-year smoking history. She has never used smokeless tobacco. She reports that she does not drink alcohol or use illicit drugs.   Family History:  The patient's family history includes Diabetes in her brother, mother, other, paternal grandmother, and sister; Heart Problems in her sister; Hypertension in her brother, father, and mother; Kidney failure in her sister.   ROS:  Please see the history of present illness.   All other  systems reviewed and negative.    PHYSICAL EXAM: VS:  BP 120/88 mmHg  Pulse 96  Ht 5\' 2"  (1.575 m)  Wt 250 lb 9.6 oz (113.671 kg)  BMI 45.82 kg/m2  SpO2 98%  LMP 10/28/2013 Well nourished, well developed, in no acute distressobese. HEENT: normal Neck:  JVD difficult to assess due to body habitus Cardiac:  normal S1, S2; irreg irreg; no murmur Lungs:  clear to auscultation bilaterally, no wheezing, rhonchi or rales Abd: soft, nontender, no  hepatomegaly Ext: 1+ bilateral LE edema Skin: warm and dry Neuro:  CNs 2-12 intact, no focal abnormalities noted  EKG:  none      ASSESSMENT AND PLAN:  Kathleen Kane is a 54 y.o. female with a hx of chronic A. fib, diastolic HF, OSA (does not use CPAP), HTN, DM, RA, obesity and previous tobacco abuse who presents for evaluation of dyspnea, LE edema and orthopnea.  Acute on chronic diastolic CHF: I am not convinced she is volume overloaded. Will order a BNP.  She is up 10 lbs from 04/10/15 when she was 240 lbs and she is 250lbs today. -- We went over fluid and salt restriction. We will increase her lasix from 40mg  qd to 40 mg BID and increase potassium to Kdur 18mEq BID x 4days then return to normal dosing. -- BMET on Monday to check potassium and renal function  Chronic atrial fibrillation: Continue rate control. ECG not done today but HR 96 -- Continue Eliquis  Essential hypertension: Controlled on lopressor 12.5 mg BID  Type 2 diabetes mellitus with complication: Follow-up with primary care.   Disposition:  FU with Luisa Dago PA in 2 weeks.    Signed, Vesta Mixer, PA-C, [facility] 07/24/2015 8:49 PM    [address], [place], [place]  [address] Phone: [phone]; Fax: [phone]

## 2015-07-28 ENCOUNTER — Other Ambulatory Visit: Payer: Medicaid Other

## 2015-08-08 ENCOUNTER — Ambulatory Visit (INDEPENDENT_AMBULATORY_CARE_PROVIDER_SITE_OTHER): Payer: Medicaid Other | Admitting: Physician Assistant

## 2015-08-08 ENCOUNTER — Encounter: Payer: Self-pay | Admitting: Physician Assistant

## 2015-08-08 VITALS — BP 140/85 | HR 77 | Ht 62.0 in | Wt 256.0 lb

## 2015-08-08 DIAGNOSIS — I5033 Acute on chronic diastolic (congestive) heart failure: Secondary | ICD-10-CM | POA: Insufficient documentation

## 2015-08-08 DIAGNOSIS — I482 Chronic atrial fibrillation, unspecified: Secondary | ICD-10-CM

## 2015-08-08 DIAGNOSIS — I10 Essential (primary) hypertension: Secondary | ICD-10-CM

## 2015-08-08 HISTORY — DX: Acute on chronic diastolic (congestive) heart failure: I50.33

## 2015-08-08 MED ORDER — FUROSEMIDE 40 MG PO TABS: ORAL_TABLET | ORAL | Status: DC

## 2015-08-08 NOTE — Progress Notes (Addendum)
Patient ID: Kathleen Kane, female   DOB: 04-09-1961, 54 y.o.   MRN: [id number]    Date:  08/08/2015   ID:  Kathleen Kane, DOB Aug 25, 1961, MRN [id number]  PCP:  Philis Fendt, MD  Primary Cardiologist:  Johnsie Cancel   Chief complaint: Heart failure follow-up   History of Present Illness: Kathleen Kane is a 54 y.o. female with a hx of chronic A. fib, diastolic HF, OSA (does not use CPAP), HTN, DM, RA, obesity and previous tobacco abuse who presented on September 15 for evaluation of dyspnea, LE edema and orthopnea.  She has had poor adherence to medications in the past. Dr. Johnsie Cancel has not placed on anticoagulation due to issues with adherence and finances. Patient was seen in this office by Rosaria Ferries, PA-C in 12/2014. She was volume overloaded and her Lasix was adjusted. She was also placed on Xarelto as she had Medicaid and ability to afford NOACs. Echocardiogram demonstrated normal LV function, moderate LAE, mild MR, PASP 33 mmHg. She is on prednisone which she is trying to taper off of, methotrexate and plaquenil for rheumatoid arthritis. She has chronic atrial fibrillation but had discontinued Xarelto several months ago because of information stating that it may cause death. She was seen by Richardson Dopp PA-C on 04/10/15 and reported that she woke up that morning with visual disturbance in the right eye. She described a pink and yellow jagged circle. CT of the head was performed that day, which was unremarkable. She has since been started on Eliquis. She was later seen by neurology who felt her sx were more consistent with visual migraine aura.   On the 15th her Lasix was increased to 40 mg twice daily for 4 days. She had a basic metabolic panel which was good and a BNP which was 216. The patient is now here for follow-up. Her weight has increased over 6 pounds. She's been drinking approximately 3 l of water a day in addition to probably 1-2 sodas a day. He is not weighing herself daily. She  says she does eat a lot of boiled foods sodium intake is low. She does eat a lot of sugar however.   She does do have some lower extremity edema but she says is better than it was yesterday. She does sleep somewhat elevated with 3 or 4 comfort orders up behind her. Her activity level is limited due to her rheumatoid arthritis. However [facility]  The patient currently denies nausea, vomiting, fever, chest pain, dizziness, PND, cough, congestion, abdominal pain, hematochezia, melena,  claudication.  Wt Readings from Last 3 Encounters:  08/08/15 256 lb (116.121 kg)  07/24/15 250 lb 9.6 oz (113.671 kg)  05/29/15 250 lb (113.399 kg)     Past Medical History  Diagnosis Date  . Atrial fibrillation, persistent   . CHF (congestive heart failure) 2013    No echo found from that time, most likely diastolic  . Diabetes mellitus without complication   . Hypertension   . Depression   . Anxiety   . Rheumatoid arthritis   . Obesity   . Obstructive sleep apnea     Sleep study performed 10/18/2008. AHI-6.8/hr, during REM-27.3/hr. RDI-25.0/hr, during REM-40.9/hr. avg o2 sat during REM and NREM 95%    Current Outpatient Prescriptions  Medication Sig Dispense Refill  . allopurinol (ZYLOPRIM) 300 MG tablet TAKE 1 TABLET (300 MG TOTAL) BY MOUTH DAILY.  2  . apixaban (ELIQUIS) 5  MG TABS tablet Take 5 mg by mouth 2 (two) times daily.    . B Complex-C (B-COMPLEX WITH VITAMIN C) tablet Take 1 tablet by mouth daily.    . calcium-vitamin D (OSCAL WITH D) 500-200 MG-UNIT TABS Take 1 tablet by mouth daily.   3  . folic acid (FOLVITE) 1 MG tablet Take 1 mg by mouth every morning.     . hydroxychloroquine (PLAQUENIL) 200 MG tablet Take 200 mg by mouth 2 (two) times daily.   3  . metFORMIN (GLUCOPHAGE) 500 MG tablet Take 500 mg by mouth 2 (two) times daily.     . methotrexate (RHEUMATREX) 2.5 MG tablet Take 10 mg by mouth every Monday. Caution:Chemotherapy. Protect from  light.    . metoprolol tartrate (LOPRESSOR) 25 MG tablet Take 0.5 tablets (12.5 mg total) by mouth 2 (two) times daily. 180 tablet 3  . NOVOLOG MIX 70/30 FLEXPEN (70-30) 100 UNIT/ML FlexPen Inject 20-40 Units into the skin 2 (two) times daily with a meal. 20 units in the morning, 40 units in the evening  5  . OVER THE COUNTER MEDICATION Take 1 Dose by mouth daily. Trace minerals    . potassium chloride SA (K-DUR,KLOR-CON) 20 MEQ tablet Take 0.5 tablets (10 mEq total) by mouth 2 (two) times daily.    . predniSONE (DELTASONE) 5 MG tablet Take 7.5 mg by mouth daily with breakfast.    . furosemide (LASIX) 40 MG tablet Take 1 tablet by mouth twice daily for 4 days then take 1 tablet by mouth daily 180 tablet 3   No current facility-administered medications for this visit.    Allergies:    Allergies  Allergen Reactions  . Ace Inhibitors Cough  . Lisinopril Cough    Social History:  The patient  reports that she quit smoking about 14 months ago. Her smoking use included Cigarettes. She has a 18 pack-year smoking history. She has never used smokeless tobacco. She reports that she does not drink alcohol or use illicit drugs.   Family history:   Family History  Problem Relation Age of Onset  . Diabetes Other   . Hypertension Mother   . Diabetes Mother   . Hypertension Father   . Diabetes Paternal Grandmother   . Diabetes Sister   . Heart Problems Sister   . Kidney failure Sister   . Hypertension Brother   . Diabetes Brother     ROS:  Please see the history of present illness.  All other systems reviewed and negative.   PHYSICAL EXAM: VS:  BP 140/85 mmHg  Pulse 77  Ht 5\' 2"  (1.575 m)  Wt 256 lb (116.121 kg)  BMI 46.81 kg/m2  SpO2 98%  LMP 10/28/2013 Obese, well developed, in no acute distress HEENT: Pupils are equal round react to light accommodation extraocular movements are intact.  Neck: no JVDNo cervical lymphadenopathy. Cardiac: Irregular rate and rhythm without murmurs rubs  or gallops. Lungs:  clear to auscultation bilaterally, no wheezing, rhonchi or rales Abd: soft, nontender, positive bowel sounds all quadrants, Ext: 1+ lower extremity edema.  2+ radial pulses. Skin: warm and dry Neuro:  Grossly normal    ASSESSMENT AND PLAN:  Problem List Items Addressed This Visit    Essential hypertension   Relevant Medications   furosemide (LASIX) 40 MG tablet   Other Relevant Orders   Basic Metabolic Panel (BMET)   Atrial fibrillation - Primary   Relevant Medications   furosemide (LASIX) 40 MG tablet   Other Relevant Orders  Basic Metabolic Panel (BMET)   Acute on chronic diastolic heart failure   Relevant Medications   furosemide (LASIX) 40 MG tablet     Acute on chronic diastolic CHF: Patient has gained another 6 pounds since her last office visit. She is not watching her fluid intake at all his drinking in excess of a gallon a day.  She also eats a lot of sugar and is on prednisone. I've increased her Lasix again to 40 mg twice a day for the next 4 days. We'll checked a basic metabolic panel today. She'll follow-up in 2 weeks. We tried to reinforce daily weight monitoring and when to call the office for weight gain.  Chronic atrial fibrillation:  Continue rate control. Continue Eliquis  Essential hypertension:  Controlled on lopressor 12.5 mg BID

## 2015-08-08 NOTE — Patient Instructions (Addendum)
Medication Instructions:  Your physician has recommended you make the following change in your medication:  1.  INCREASE the Lasix to 40 mg taking 1 tablet twice a day for 4 days then take 1 tablet daily after that     Labwork: TODAY:  BMET  Testing/Procedures: None ordered  Follow-Up: Your physician recommends that you schedule a follow-up appointment in: [place], PA-C OR KATIE THOMPSON, PA-C   SPECIAL INSTRUCTIONS:  REMEMBER: YOU ARE TO HAVE NO MORE THAN 2 LITERS OF FLUID DAILY!!!!!  Daily Weight Record It is important to weigh yourself daily. Keep this daily weight chart near your scale. Weigh yourself each morning at the same time. Weigh yourself without shoes and with the same amount of clothes each day. Compare today's weight to yesterday's weight. Bring this form with you to your follow-up appointments. Call your caregiver if you gain 03 lb/1.4 kg in 1 day. Call your caregiver if you gain 05 lb/2.3 kg in a week. Date: ________ Weight: ____________________ Date: ________ Weight: ____________________ Date: ________ Weight: ____________________ Date: ________ Weight: ____________________ Date: ________ Weight: ____________________ Date: ________ Weight: ____________________ Date: ________ Weight: ____________________ Date: ________ Weight: ____________________ Date: ________ Weight: ____________________ Date: ________ Weight: ____________________ Date: ________ Weight: ____________________ Date: ________ Weight: ____________________ Date: ________ Weight: ____________________ Date: ________ Weight: ____________________ Date: ________ Weight: ____________________ Date: ________ Weight: ____________________ Date: ________ Weight: ____________________ Date: ________ Weight: ____________________ Date: ________ Weight: ____________________ Date: ________ Weight: ____________________ Date: ________ Weight: ____________________ Date: ________ Weight:  ____________________ Date: ________ Weight: ____________________ Date: ________ Weight: ____________________ Date: ________ Weight: ____________________ Date: ________ Weight: ____________________ Date: ________ Weight: ____________________ Date: ________ Weight: ____________________ Date: ________ Weight: ____________________ Date: ________ Weight: ____________________ Date: ________ Weight: ____________________ Date: ________ Weight: ____________________ Date: ________ Weight: ____________________ Date: ________ Weight: ____________________ Date: ________ Weight: ____________________ Date: ________ Weight: ____________________ Date: ________ Weight: ____________________ Date: ________ Weight: ____________________ Date: ________ Weight: ____________________ Date: ________ Weight: ____________________ Date: ________ Weight: ____________________ Date: ________ Weight: ____________________ Date: ________ Weight: ____________________ Date: ________ Weight: ____________________ Date: ________ Weight: ____________________ Date: ________ Weight: ____________________ Date: ________ Weight: ____________________ Date: ________ Weight: ____________________ Date: ________ Weight: ____________________ Date: ________ Weight: ____________________

## 2015-08-11 ENCOUNTER — Other Ambulatory Visit: Payer: Medicaid Other

## 2015-08-13 ENCOUNTER — Other Ambulatory Visit (INDEPENDENT_AMBULATORY_CARE_PROVIDER_SITE_OTHER): Payer: Medicaid Other | Admitting: *Deleted

## 2015-08-13 DIAGNOSIS — I482 Chronic atrial fibrillation: Secondary | ICD-10-CM

## 2015-08-13 LAB — BASIC METABOLIC PANEL
BUN: 17 mg/dL (ref 7–25)
CALCIUM: 8.9 mg/dL (ref 8.6–10.4)
CO2: 21 mmol/L (ref 20–31)
CREATININE: 0.71 mg/dL (ref 0.50–1.05)
Chloride: 103 mmol/L (ref 98–110)
Glucose, Bld: 318 mg/dL — ABNORMAL HIGH (ref 65–99)
Potassium: 4.4 mmol/L (ref 3.5–5.3)
SODIUM: 138 mmol/L (ref 135–146)

## 2015-08-13 NOTE — Addendum Note (Signed)
Addended by: Domenica Reamer R on: 08/13/2015 12:59 PM   Modules accepted: Orders

## 2015-08-20 ENCOUNTER — Encounter: Payer: Self-pay | Admitting: Cardiology

## 2015-08-20 ENCOUNTER — Ambulatory Visit (INDEPENDENT_AMBULATORY_CARE_PROVIDER_SITE_OTHER): Payer: Medicaid Other | Admitting: Cardiology

## 2015-08-20 VITALS — BP 130/80 | HR 80 | Ht 62.0 in | Wt 254.0 lb

## 2015-08-20 DIAGNOSIS — I5032 Chronic diastolic (congestive) heart failure: Secondary | ICD-10-CM

## 2015-08-20 NOTE — Progress Notes (Signed)
08/20/2015 Kathleen Kane   December 01, 1960  [id number]  Primary Physician Philis Fendt, MD Primary Cardiologist: Dr. Johnsie Cancel    Reason for Visit/CC: F/u for acute diastolic CHF exacerbation  HPI:  The patient is a 54 year old female, followed by Dr. Johnsie Cancel, with a  history of chronic atrial fibrillation on Eliquis, chronic diastolic heart failure, obstructive sleep apnea noncompliant with CPAP therapy, hypertension, diabetes, rheumatoid arthritis, obesity, previous history of tobacco abuse and known h/o medication noncompliance.   She presents to clinic today for follow-up of her diastolic heart failure. She recently had an acute exacerbation with increased edema and weight gain and has required increased doses of Lasix. She was most recently seen by Tarri Fuller, PA-C, on 08/08/15. At that time, she was noted to have a 6 lb weight gain and admitted that she had not been compliant with fluid restriction, drinking in excess of a gallon of water a day. She also admitted to eating a lot of sugar and was on prednisone for her RA. Mr. Samara Snide increased her Lasix to 40 mg BID x 4 days and ordered a BMP which was checked on 08/13/15. This showed normal SCr at 0.71 and normal K at 4.4. She now presents to clinic for 2 week f/u.   She reports that she has done fairly well since her last OV. She notes improvement after increased dose of lasix. She denies any resting dyspnea. She still has occasional orthopnea and sleeps with 2 pillows but no PND. She also denies CP. She has reduced her daily fluid intake to less than 64 oz/day. She reports adherence to a low sodium diet. She has also been fully compliant with Eliquis for Afib. She denies any abnormal bleeding.      Current Outpatient Prescriptions  Medication Sig Dispense Refill  . allopurinol (ZYLOPRIM) 300 MG tablet TAKE 1 TABLET (300 MG TOTAL) BY MOUTH DAILY.  2  . apixaban (ELIQUIS) 5 MG TABS tablet Take 5 mg by mouth 2 (two) times daily.    . B  Complex-C (B-COMPLEX WITH VITAMIN C) tablet Take 1 tablet by mouth daily.    . calcium-vitamin D (OSCAL WITH D) 500-200 MG-UNIT TABS Take 1 tablet by mouth daily.   3  . folic acid (FOLVITE) 1 MG tablet Take 1 mg by mouth every morning.     . furosemide (LASIX) 40 MG tablet Take 1 tablet by mouth twice daily for 4 days then take 1 tablet by mouth daily 180 tablet 3  . hydroxychloroquine (PLAQUENIL) 200 MG tablet Take 200 mg by mouth 2 (two) times daily.   3  . metFORMIN (GLUCOPHAGE) 500 MG tablet Take 500 mg by mouth 2 (two) times daily.     . methotrexate (RHEUMATREX) 2.5 MG tablet Take 10 mg by mouth every Monday. Caution:Chemotherapy. Protect from light.    . metoprolol tartrate (LOPRESSOR) 25 MG tablet Take 0.5 tablets (12.5 mg total) by mouth 2 (two) times daily. 180 tablet 3  . NOVOLOG MIX 70/30 FLEXPEN (70-30) 100 UNIT/ML FlexPen Inject 20-40 Units into the skin 2 (two) times daily with a meal. 20 units in the morning, 40 units in the evening  5  . OVER THE COUNTER MEDICATION Take 1 Dose by mouth daily. Trace minerals    . potassium chloride SA (K-DUR,KLOR-CON) 20 MEQ tablet Take 0.5 tablets (10 mEq total) by mouth 2 (two) times daily.    . predniSONE (DELTASONE) 5 MG tablet Take 10 mg by mouth daily with breakfast. TAKE TWO TABS  BY MOUTH DAILY     No current facility-administered medications for this visit.    Allergies  Allergen Reactions  . Ace Inhibitors Cough  . Lisinopril Cough    Social History   Social History  . Marital Status: Married    Spouse Name: N/A  . Number of Children: N/A  . Years of Education: N/A   Occupational History  . Not on file.   Social History Main Topics  . Smoking status: Former Smoker -- 0.75 packs/day for 24 years    Types: Cigarettes    Quit date: 06/08/2014  . Smokeless tobacco: Never Used     Comment: smoking cessation info given.   . Alcohol Use: No  . Drug Use: No  . Sexual Activity:    Partners: Male   Other Topics Concern  .  Not on file   Social History Narrative     Review of Systems: General: negative for chills, fever, night sweats or weight changes.  Cardiovascular: negative for chest pain, dyspnea on exertion, edema, orthopnea, palpitations, paroxysmal nocturnal dyspnea or shortness of breath Dermatological: negative for rash Respiratory: negative for cough or wheezing Urologic: negative for hematuria Abdominal: negative for nausea, vomiting, diarrhea, bright red blood per rectum, melena, or hematemesis Neurologic: negative for visual changes, syncope, or dizziness All other systems reviewed and are otherwise negative except as noted above.    Blood pressure 130/80, pulse 80, height 5\' 2"  (1.575 m), weight 254 lb (115.214 kg), last menstrual period 10/28/2013.  General appearance: alert, cooperative, no distress and moderately obese Neck: no carotid bruit and no JVD Lungs: clear to auscultation bilaterally Heart: irregularly irregular rhythm and regular rate Extremities: trace - 1+ bilateral LEE Pulses: 2+ and symmetric Skin: warm and dry Neurologic: Grossly normal  EKG not performed  ASSESSMENT AND PLAN:   1. Chronic Diastolic CHF: Her weight is down 2 pounds and she denies any dyspnea. She does however have trace to 1+ bilateral lower extremity edema on physical exam.n Lungs are CTAB. Overall, she notes significant subjective improvement since her last office visit. Given the fact that she is on chronic prednisone therapy to treat her rheumatoid arthritis, fluid retention will be a continuous issue. For now we have decided to continue her on 40 mg of Lasix daily. She will use sliding scale Lasix dosing instructions going forward. She has been instructed to continue with daily weights and to take an extra Lasix tablet if needed, if weight gain greater than 3 pounds in a 24 hr period. She has also been advised to increase her potassium supplementation on days that she takes an extra Lasix tablet. We  also discussed the importance of low sodium diet.  2. Chronic Atrial Fibrillation: HR is well controlled on metoprolol and she is asymptomatic. She reports full compliance with Eliquis. No abnormal bleeding of falls. Continue metoprolol for rate control and Eliquis for stroke prophylaxis.   3. HTN: BP is well controlled.   4. DM: on metformin and insulin. Followed by PCP.   PLAN  Continue current plan of care as outlined above. Continue routine care with Dr. Johnsie Cancel as directed.   Lyda Jester PA-C 08/20/2015 1:06 PM

## 2015-08-20 NOTE — Patient Instructions (Signed)
Medication Instructions:  Your physician has recommended you make the following change in your medication:  1.  INCREASE YOUR LASIX ONLY IF YOUR WEIGHT INCREASES BY 3 POUNDS IN A DAY TO TAKE 40 MG TWICE A DAY.  WHEN YOU HAVE TO DO THIS, MAKE SURE YOU INCREASE YOUR POTASSIUM AS WELL  Labwork: None ordered  Testing/Procedures: None ordered  Follow-Up: Your physician wants you to keep your scheduled follow-up appointment with Dr. Johnsie Cancel 09-17-15 @ 9:30.   Any Other Special Instructions Will Be Listed Below (If Applicable).  Daily Weight Record It is important to weigh yourself daily. Keep this daily weight chart near your scale. INCREASE THE LASIX TO 40 MG TWICE A DAY ONLY WHEN YOUR WEIGHT IS UP 3 POUNDS IN A DAY.  Weigh yourself each morning at the same time. Weigh yourself without shoes, and wear the same amount of clothing each day. Compare today's weight to yesterday's weight. Bring this form with you to your follow-up appointments. Call your health care provider if you have concerns about your weight, including rapid weight gain or rapid weight loss. Date: ________ Weight: ____________________ Date: ________ Weight: ____________________ Date: ________ Weight: ____________________ Date: ________ Weight: ____________________ Date: ________ Weight: ____________________ Date: ________ Weight: ____________________ Date: ________ Weight: ____________________ Date: ________ Weight: ____________________ Date: ________ Weight: ____________________ Date: ________ Weight: ____________________ Date: ________ Weight: ____________________ Date: ________ Weight: ____________________ Date: ________ Weight: ____________________ Date: ________ Weight: ____________________ Date: ________ Weight: ____________________ Date: ________ Weight: ____________________ Date: ________ Weight: ____________________ Date: ________ Weight: ____________________ Date: ________ Weight: ____________________ Date: ________  Weight: ____________________ Date: ________ Weight: ____________________ Date: ________ Weight: ____________________ Date: ________ Weight: ____________________ Date: ________ Weight: ____________________ Date: ________ Weight: ____________________ Date: ________ Weight: ____________________ Date: ________ Weight: ____________________ Date: ________ Weight: ____________________ Date: ________ Weight: ____________________ Date: ________ Weight: ____________________ Date: ________ Weight: ____________________ Date: ________ Weight: ____________________ Date: ________ Weight: ____________________ Date: ________ Weight: ____________________ Date: ________ Weight: ____________________ Date: ________ Weight: ____________________ Date: ________ Weight: ____________________ Date: ________ Weight: ____________________ Date: ________ Weight: ____________________ Date: ________ Weight: ____________________ Date: ________ Weight: ____________________ Date: ________ Weight: ____________________ Date: ________ Weight: ____________________ Date: ________ Weight: ____________________ Date: ________ Weight: ____________________ Date: ________ Weight: ____________________ Date: ________ Weight: ____________________ Date: ________ Weight: ____________________ Date: ________ Weight: ____________________ Date: ________ Weight: ____________________   This information is not intended to replace advice given to you by your health care provider. Make sure you discuss any questions you have with your health care provider.   Document Released: 01/06/2007 Document Revised: 11/15/2014 Document Reviewed: 05/24/2014 Elsevier Interactive Patient Education 2016 Elsevier Inc.   Low-Sodium Eating Plan Sodium raises blood pressure and causes water to be held in the body. Getting less sodium from food will help lower your blood pressure, reduce any swelling, and protect your heart, liver, and kidneys. We get sodium by adding  salt (sodium chloride) to food. Most of our sodium comes from canned, boxed, and frozen foods. Restaurant foods, fast foods, and pizza are also very high in sodium. Even if you take medicine to lower your blood pressure or to reduce fluid in your body, getting less sodium from your food is important. WHAT IS MY PLAN? Most people should limit their sodium intake to 2,300 mg a day. Your health care provider recommends that you limit your sodium intake to 2 GRAMS OF SALT a day.  WHAT DO I NEED TO KNOW ABOUT THIS EATING PLAN? For the low-sodium eating plan, you will follow these general guidelines:  Choose foods with a % Daily Value  for sodium of less than 5% (as listed on the food label).   Use salt-free seasonings or herbs instead of table salt or sea salt.   Check with your health care provider or pharmacist before using salt substitutes.   Eat fresh foods.  Eat more vegetables and fruits.  Limit canned vegetables. If you do use them, rinse them well to decrease the sodium.   Limit cheese to 1 oz (28 g) per day.   Eat lower-sodium products, often labeled as "lower sodium" or "no salt added."  Avoid foods that contain monosodium glutamate (MSG). MSG is sometimes added to [place] food and some canned foods.  Check food labels (Nutrition Facts labels) on foods to learn how much sodium is in one serving.  Eat more home-cooked food and less restaurant, buffet, and fast food.  When eating at a restaurant, ask that your food be prepared with less salt, or no salt if possible.  HOW DO I READ FOOD LABELS FOR SODIUM INFORMATION? The Nutrition Facts label lists the amount of sodium in one serving of the food. If you eat more than one serving, you must multiply the listed amount of sodium by the number of servings. Food labels may also identify foods as:  Sodium free--Less than 5 mg in a serving.  Very low sodium--35 mg or less in a serving.  Low sodium--140 mg or less in a  serving.  Light in sodium--50% less sodium in a serving. For example, if a food that usually has 300 mg of sodium is changed to become light in sodium, it will have 150 mg of sodium.  Reduced sodium--25% less sodium in a serving. For example, if a food that usually has 400 mg of sodium is changed to reduced sodium, it will have 300 mg of sodium. WHAT FOODS CAN I EAT? Grains Low-sodium cereals, including oats, puffed wheat and rice, and shredded wheat cereals. Low-sodium crackers. Unsalted rice and pasta. Lower-sodium bread.  Vegetables Frozen or fresh vegetables. Low-sodium or reduced-sodium canned vegetables. Low-sodium or reduced-sodium tomato sauce and paste. Low-sodium or reduced-sodium tomato and vegetable juices.  Fruits Fresh, frozen, and canned fruit. Fruit juice.  Meat and Other Protein Products Low-sodium canned tuna and salmon. Fresh or frozen meat, poultry, seafood, and fish. Lamb. Unsalted nuts. Dried beans, peas, and lentils without added salt. Unsalted canned beans. Homemade soups without salt. Eggs.  Dairy Milk. Soy milk. Ricotta cheese. Low-sodium or reduced-sodium cheeses. Yogurt.  Condiments Fresh and dried herbs and spices. Salt-free seasonings. Onion and garlic powders. Low-sodium varieties of mustard and ketchup. Fresh or refrigerated horseradish. Lemon juice.  Fats and Oils Reduced-sodium salad dressings. Unsalted butter.  Other Unsalted popcorn and pretzels.  The items listed above may not be a complete list of recommended foods or beverages. Contact your dietitian for more options. WHAT FOODS ARE NOT RECOMMENDED? Grains Instant hot cereals. Bread stuffing, pancake, and biscuit mixes. Croutons. Seasoned rice or pasta mixes. Noodle soup cups. Boxed or frozen macaroni and cheese. Self-rising flour. Regular salted crackers. Vegetables Regular canned vegetables. Regular canned tomato sauce and paste. Regular tomato and vegetable juices. Frozen vegetables in  sauces. Salted [place] fries. Olives. Angie Fava. Relishes. Sauerkraut. Salsa. Meat and Other Protein Products Salted, canned, smoked, spiced, or pickled meats, seafood, or fish. Bacon, ham, sausage, hot dogs, corned beef, chipped beef, and packaged luncheon meats. Salt pork. Jerky. Pickled herring. Anchovies, regular canned tuna, and sardines. Salted nuts. Dairy Processed cheese and cheese spreads. Cheese curds. Blue cheese and cottage cheese. Buttermilk.  Condiments Onion and garlic salt, seasoned salt, table salt, and sea salt. Canned and packaged gravies. Worcestershire sauce. Tartar sauce. Barbecue sauce. Teriyaki sauce. Soy sauce, including reduced sodium. Steak sauce. Fish sauce. Oyster sauce. Cocktail sauce. Horseradish that you find on the shelf. Regular ketchup and mustard. Meat flavorings and tenderizers. Bouillon cubes. Hot sauce. Tabasco sauce. Marinades. Taco seasonings. Relishes. Fats and Oils Regular salad dressings. Salted butter. Margarine. Ghee. Bacon fat.  Other Potato and tortilla chips. Corn chips and puffs. Salted popcorn and pretzels. Canned or dried soups. Pizza. Frozen entrees and pot pies.  The items listed above may not be a complete list of foods and beverages to avoid. Contact your dietitian for more information.   This information is not intended to replace advice given to you by your health care provider. Make sure you discuss any questions you have with your health care provider.   Document Released: 04/16/2002 Document Revised: 11/15/2014 Document Reviewed: 08/29/2013 Elsevier Interactive Patient Education [employer].

## 2015-09-01 ENCOUNTER — Telehealth: Payer: Self-pay | Admitting: Cardiovascular Disease

## 2015-09-01 NOTE — Telephone Encounter (Signed)
Ok to go back to school No restrictions She has diastolic CHF brought on by poor diet and med noncompliance So long as she takes meds she should be ok Afib is chronic with good rate control and on anticoagulation for stroke prevention  Jenkins Rouge

## 2015-09-01 NOTE — Telephone Encounter (Signed)
I left a message for the patient to call. Needed phone # at [facility] to call and confirm fax # with them for HIPPA.

## 2015-09-01 NOTE — Telephone Encounter (Signed)
New problem   [employer] Fax #: [phone]    Pt need letter to start going back going to school. She had to stop because of heart issues. The letter need to state her heart issues. Please advise pt and fax to [facility] Fax #: [phone]. Call pt if needed.

## 2015-09-01 NOTE — Telephone Encounter (Signed)
Will forward to Dr. Johnsie Cancel to review. Ok to do letter? Any restrictions?

## 2015-09-03 ENCOUNTER — Encounter (HOSPITAL_COMMUNITY): Payer: Self-pay

## 2015-09-03 ENCOUNTER — Emergency Department (HOSPITAL_COMMUNITY)
Admission: EM | Admit: 2015-09-03 | Discharge: 2015-09-03 | Disposition: A | Discharge status: Home / Self Care | Payer: Medicaid Other | Attending: Emergency Medicine | Admitting: Emergency Medicine

## 2015-09-03 DIAGNOSIS — Z7902 Long term (current) use of antithrombotics/antiplatelets: Secondary | ICD-10-CM | POA: Insufficient documentation

## 2015-09-03 DIAGNOSIS — I481 Persistent atrial fibrillation: Secondary | ICD-10-CM | POA: Diagnosis not present

## 2015-09-03 DIAGNOSIS — Z8659 Personal history of other mental and behavioral disorders: Secondary | ICD-10-CM | POA: Diagnosis not present

## 2015-09-03 DIAGNOSIS — G8929 Other chronic pain: Secondary | ICD-10-CM | POA: Diagnosis not present

## 2015-09-03 DIAGNOSIS — Z7952 Long term (current) use of systemic steroids: Secondary | ICD-10-CM | POA: Diagnosis not present

## 2015-09-03 DIAGNOSIS — M069 Rheumatoid arthritis, unspecified: Secondary | ICD-10-CM

## 2015-09-03 DIAGNOSIS — E119 Type 2 diabetes mellitus without complications: Secondary | ICD-10-CM | POA: Insufficient documentation

## 2015-09-03 DIAGNOSIS — E669 Obesity, unspecified: Secondary | ICD-10-CM | POA: Insufficient documentation

## 2015-09-03 DIAGNOSIS — Z87891 Personal history of nicotine dependence: Secondary | ICD-10-CM | POA: Diagnosis not present

## 2015-09-03 DIAGNOSIS — Z79899 Other long term (current) drug therapy: Secondary | ICD-10-CM | POA: Diagnosis not present

## 2015-09-03 DIAGNOSIS — I509 Heart failure, unspecified: Secondary | ICD-10-CM | POA: Insufficient documentation

## 2015-09-03 DIAGNOSIS — I10 Essential (primary) hypertension: Secondary | ICD-10-CM | POA: Diagnosis not present

## 2015-09-03 DIAGNOSIS — R531 Weakness: Secondary | ICD-10-CM | POA: Diagnosis present

## 2015-09-03 MED ORDER — OXYCODONE-ACETAMINOPHEN 5-325 MG PO TABS
1.0000 | ORAL_TABLET | Freq: Four times a day (QID) | ORAL | Status: DC | PRN
Start: 2015-09-03 — End: 2016-01-10

## 2015-09-03 MED ORDER — PREDNISONE 5 MG PO TABS: 15.0000 mg | ORAL_TABLET | Freq: Every day | ORAL | Status: DC

## 2015-09-03 NOTE — ED Notes (Signed)
EDPA JAMIE WARD  at bedside.

## 2015-09-03 NOTE — ED Provider Notes (Signed)
CSN: [id number]     Arrival date & time 09/03/15  0703 History   First MD Initiated Contact with Patient 09/03/15 [phone]     Chief Complaint  Patient presents with  . Joint Pain  . Weakness     (Consider location/radiation/quality/duration/timing/severity/associated sxs/prior Treatment) Patient is a 54 y.o. female presenting with weakness. The history is provided by the patient, a relative and medical records. No language interpreter was used.  Weakness Associated symptoms include arthralgias, joint swelling and weakness. Pertinent negatives include no abdominal pain, chills, congestion, coughing, fatigue, fever, headaches, myalgias, nausea, neck pain, numbness, rash, sore throat or vomiting.  Kathleen Kane is a 14 yoAAF with PMH of RA who presents with joint pain - 9/10- worst in knees, ankles, shoulders, hands. She has chronic joint pain secondary to her RA but for the last three weeks the pain has increased in intensity and she has become more stiff. Patient states that she is currently on MTX and attempting to taper off of prednisone under the guidance of her rheumatologist. She ran out of prednisone yesterday, and states that when she gets gets below 15 mg, she flares up. Denies alleviating or aggravating factors. States she has tried OTC tylenol and advil with no relief.   Past Medical History  Diagnosis Date  . Atrial fibrillation, persistent ([place])   . CHF (congestive heart failure) ([place]) 2013    No echo found from that time, most likely diastolic  . Diabetes mellitus without complication ([place])   . Hypertension   . Depression   . Anxiety   . Rheumatoid arthritis ([place])   . Obesity   . Obstructive sleep apnea     Sleep study performed 10/18/2008. AHI-6.8/hr, during REM-27.3/hr. RDI-25.0/hr, during REM-40.9/hr. avg o2 sat during REM and NREM 95%   Past Surgical History  Procedure Laterality Date  . Cesarean section      x2  . Left breast cyst    . Laparotomy Right 02/27/2013    Procedure: EXPLORATORY LAPAROTOMY, right salpingo-oopherectomy;  Surgeon: [name], MD;  Location: [facility];  Service: Gynecology;  Laterality: Right;  . Salpingoophorectomy Right 02/27/2013    Procedure: SALPINGO OOPHORECTOMY;  Surgeon: [name], MD;  Location: [facility];  Service: Gynecology;  Laterality: Right;  . Cardiovascular stress test  02/16/2013    no significant EKG changes with Lexiscan, normal LV function and normal wall function  . Doppler echocardiography  01/24/2008    EF 55-60%, no diagnostic evidence of LV wall motion abnormalities, LV wall thickness was mild-moderately increased.   Family History  Problem Relation Age of Onset  . Diabetes Other   . Hypertension Mother   . Diabetes Mother   . Hypertension Father   . Diabetes Paternal Grandmother   . Diabetes Sister   . Heart Problems Sister   . Kidney failure Sister   . Hypertension Brother   . Diabetes Brother    Social History  Substance Use Topics  . Smoking status: Former Smoker -- 0.75 packs/day for 24 years    Types: Cigarettes    Quit date: 06/08/2014  . Smokeless tobacco: Never Used     Comment: smoking cessation info given.   . Alcohol Use: No   OB History    No data available     Review of Systems  Constitutional: Negative for fever, chills and fatigue.  HENT: Negative for congestion, hearing loss, rhinorrhea and sore throat.   Eyes: Negative for visual disturbance.  Respiratory: Negative for cough, shortness of  breath and wheezing.   Cardiovascular: Negative.   Gastrointestinal: Negative for nausea, vomiting, abdominal pain, diarrhea and constipation.  Endocrine: Negative for polydipsia and polyuria.  Musculoskeletal: Positive for joint swelling and arthralgias. Negative for myalgias, back pain and neck pain.  Skin: Negative for rash.  Neurological: Positive for weakness. Negative for dizziness, numbness and headaches.      Allergies  Ace inhibitors and Lisinopril  Home Medications    Prior to Admission medications   Medication Sig Start Date End Date Taking? Authorizing Provider  allopurinol (ZYLOPRIM) 300 MG tablet TAKE 1 TABLET (300 MG TOTAL) BY MOUTH DAILY. 04/10/15  Yes Historical Provider, MD  apixaban (ELIQUIS) 5 MG TABS tablet Take 5 mg by mouth 2 (two) times daily.   Yes Historical Provider, MD  B Complex-C (B-COMPLEX WITH VITAMIN C) tablet Take 1 tablet by mouth daily.   Yes Historical Provider, MD  calcium-vitamin D (OSCAL WITH D) 500-200 MG-UNIT TABS Take 1 tablet by mouth daily.  03/11/15  Yes Historical Provider, MD  folic acid (FOLVITE) 1 MG tablet Take 1 mg by mouth every morning.    Yes Historical Provider, MD  furosemide (LASIX) 40 MG tablet Take 1 tablet by mouth twice daily for 4 days then take 1 tablet by mouth daily Patient taking differently: Take 40 mg by mouth daily.  08/08/15  Yes Brett Canales, PA-C  hydroxychloroquine (PLAQUENIL) 200 MG tablet Take 200 mg by mouth 2 (two) times daily.  03/10/15  Yes Historical Provider, MD  metFORMIN (GLUCOPHAGE) 500 MG tablet Take 500 mg by mouth 2 (two) times daily.    Yes Historical Provider, MD  methotrexate (RHEUMATREX) 2.5 MG tablet Take 10 mg by mouth every Monday. Caution:Chemotherapy. Protect from light.   Yes Historical Provider, MD  metoprolol tartrate (LOPRESSOR) 25 MG tablet Take 0.5 tablets (12.5 mg total) by mouth 2 (two) times daily. 10/01/13  Yes Einar Pheasant Hager, PA-C  NOVOLOG MIX 70/30 FLEXPEN (70-30) 100 UNIT/ML FlexPen Inject 20-40 Units into the skin 2 (two) times daily with a meal. 20 units in the morning, 40 units in the evening 03/14/15  Yes Historical Provider, MD  OVER THE COUNTER MEDICATION Take 1 Dose by mouth daily. Trace minerals   Yes Historical Provider, MD  potassium chloride SA (K-DUR,KLOR-CON) 20 MEQ tablet Take 0.5 tablets (10 mEq total) by mouth 2 (two) times daily. 07/24/15  Yes Eileen Stanford, PA-C  predniSONE (DELTASONE) 5 MG tablet Take 10 mg by mouth daily with breakfast.    Yes  Historical Provider, MD   BP 168/95 mmHg  Pulse 101  Temp(Src) 97.9 F (36.6 C) (Oral)  Resp 18  Ht 5\' 2"  (1.575 m)  Wt 254 lb (115.214 kg)  BMI 46.45 kg/m2  SpO2 98%  LMP 10/28/2013 Physical Exam  Constitutional: She is oriented to person, place, and time. She appears well-developed and well-nourished.  Alert and in no acute distress  HENT:  Head: Normocephalic and atraumatic.  Neck: Normal range of motion. Neck supple.  Cardiovascular: Normal heart sounds and intact distal pulses.  Exam reveals no gallop and no friction rub.   No murmur heard. Irregularly irregular heartbeat. Patient has documented persistent afib and followed by cardiology.   Pulmonary/Chest: Effort normal and breath sounds normal. No respiratory distress. She has no wheezes. She has no rales. She exhibits no tenderness.  Abdominal: She exhibits no mass. There is no rebound and no guarding.  Abdomen soft, non-tender, non-distended Bowel sounds positive in all four quadrants  Musculoskeletal: She exhibits no edema.  Decreased ROM and stiffness of knees and shoulders secondary to pain.   Ankles and hand joints swollen; RA nodules on hands and R elbow.   Not tender to palpation of any joints; pain with movement not touch.   Neurological: She is alert and oriented to person, place, and time.  Skin: Skin is warm and dry. No rash noted. She is not diaphoretic.  Psychiatric: She has a normal mood and affect. Her behavior is normal. Judgment and thought content normal.  Vitals reviewed.   ED Course  Procedures (including critical care time) Labs Review Labs Reviewed - No data to display  Imaging Review No results found. I have personally reviewed and evaluated these images and lab results as part of my medical decision-making.   EKG Interpretation None      MDM   Final diagnoses:  None   [name] presents with RA flare. She has been titrating off her Prednisone, however, once she gets below  15mg , joint pain is not tolerable and she has difficulty walking. She is followed by Dr. Rogers Blocker, Rheumatology at [facility] who is currently on maternity leave. I spoke with Dr. Posey Pronto who at her office who stated it was okay to resume her 15mg  prednisone dose and to follow up with them at her scheduled appointment in November. Will discharge with prednisone and 2 days of percocet for pain relief during this flare.  Patient also has irregularly irregular rhythm. She has documented persistent a-fib and is followed by cardiology. She has recently seen them and this is not a concern at this time. Keep appointment in 3 months to continue to monitor this condition and stay on home meds.   Ozella Almond Ward, PA-C    [facility], [place] 09/03/15 [id number]  Noemi Chapel, MD 09/04/15 [phone]

## 2015-09-03 NOTE — ED Notes (Addendum)
Pt c/o increasing joint pain and weakness x 3 months.  Pain score 9/10.  Pt reports that she was diagnosed w/ RA x 2 years ago and sts she has been working w/ the specialist to taper down prednisone x 3 months.  Pt reports running out of prednisone last night.  Sts she has been taking OTC pain medication w/o relief.  Pt next appointment w/ specialist is in November.

## 2015-09-03 NOTE — Telephone Encounter (Signed)
Lm w/daughter for her to call; daughter states her Mom is in ER now

## 2015-09-03 NOTE — Telephone Encounter (Signed)
Kathleen Kane called back but states she doesn't know phone number.  Is currently in ER for RA and once she gets home will call us back with phone number.

## 2015-09-03 NOTE — Discharge Instructions (Signed)
Please take all medications as prescribed.  Please go to scheduled appointment with rheumatology on 11/30.  Follow up with primary doctor Continue to follow up with cardiology as directed.  Return if any new or worsening symptoms or any additional concerns.

## 2015-09-03 NOTE — Telephone Encounter (Signed)
Pt returned call and gave phone number at [facility] [phone]. Spoke w/Tim who states to fax to att: Academic Standing at [phone]. Faxed note that Dr. Johnsie Cancel states may return to school with no restrictions.

## 2015-09-03 NOTE — ED Provider Notes (Signed)
Obese 54 year old female diagnosed with rheumatoid arthritis 2 years ago presents with worsening joint pain in her knees and ankles bilaterally. She is currently taking low-dose prednisone (tapering) and is also taking methotrexate. She finds that when she gets below 15 mg per day her joints flareup and the pain is sometimes intolerable. On exam the patient does have swollen joints, fingers are swollen, she does not appear to be in distress and has no fever or tachycardia. She is able to range of motion her ankles and knees but has some discomfort with that. We will pursue discussion with her primary rheumatologist regarding going back up on prednisone or alternative therapies. The patient appears nontoxic, anticipate discharge.  Medical screening examination/treatment/procedure(s) were conducted as a shared visit with non-physician practitioner(s) and myself.  I personally evaluated the patient during the encounter.  Clinical Impression:   Final diagnoses:  Rheumatoid arthritis flare (HCC)         Noemi Chapel, MD 09/04/15 1526

## 2015-09-16 NOTE — Progress Notes (Signed)
Patient ID: Kathleen Kane, female   DOB: Dec 09, 1960, 54 y.o.   MRN: [id number]    Date:  09/17/2015   ID:  Kathleen Kane, DOB 1961/05/26, MRN [id number]  PCP:  Philis Fendt, MD  Primary Cardiologist:  Kathleen Kane   Chief complaint: Heart failure follow-up   History of Present Illness: Kathleen Kane is a 54 y.o. female with a hx of chronic A. fib, diastolic HF, OSA (does not use CPAP), HTN, DM, RA, obesity and previous tobacco abuse who presented on September 15 for evaluation of dyspnea, LE edema and orthopnea.  She has had poor adherence to medications in the past. . Patient was seen in this office by Kathleen Ferries, PA-C in 12/2014. She was volume overloaded and her Lasix was adjusted. She was also placed on Xarelto as she had Medicaid and ability to afford NOACs. Echocardiogram demonstrated normal LV function, moderate LAE, mild MR, PASP 33 mmHg. She is on prednisone which she is trying to taper off of, methotrexate and plaquenil for rheumatoid arthritis. She has chronic atrial fibrillation but had discontinued Xarelto several months ago because of information stating that it may cause death. She was seen by Richardson Dopp PA-C on 04/10/15 and reported that she woke up that morning with visual disturbance in the right eye. She described a pink and yellow jagged circle. CT of the head was performed that day, which was unremarkable. She has since been started on Eliquis. She was later seen by neurology who felt her sx were more consistent with visual migraine aura.   On the 15th her Lasix was increased to 40 mg twice daily for 4 days. She had a basic metabolic panel which was good and a BNP which was 216. The patient is now here for follow-up. Her weight has increased over 6 pounds. She's been drinking approximately 3 l of water a day in addition to probably 1-2 sodas a day. He is not weighing herself daily. She says she does eat a lot of boiled foods sodium intake is low. She does eat a lot of  sugar however.   She does do have some lower extremity edema but she says is better than it was yesterday. She does sleep somewhat elevated with 3 or 4 comfort orders up behind her. Her activity level is limited due to her rheumatoid arthritis. However [facility].  In ER 10/26 with Rheumatoid flare.  Below 15 mg prednisone she does poorly.  Also on methotrexate  BNP recently only 216   The patient currently denies nausea, vomiting, fever, chest pain, dizziness, PND, cough, congestion, abdominal pain, hematochezia, melena,  claudication.  Wt Readings from Last 3 Encounters:  09/17/15 111.766 kg (246 lb 6.4 oz)  09/03/15 115.214 kg (254 lb)  08/20/15 115.214 kg (254 lb)     Past Medical History  Diagnosis Date  . Atrial fibrillation, persistent ([place])   . CHF (congestive heart failure) ([place]) 2013    No echo found from that time, most likely diastolic  . Diabetes mellitus without complication ([place])   . Hypertension   . Depression   . Anxiety   . Rheumatoid arthritis ([place])   . Obesity   . Obstructive sleep apnea     Sleep study performed 10/18/2008. AHI-6.8/hr, during REM-27.3/hr. RDI-25.0/hr, during REM-40.9/hr. avg o2 sat during REM and NREM 95%    Current Outpatient Prescriptions  Medication Sig Dispense Refill  . allopurinol (ZYLOPRIM) 300 MG tablet  TAKE 1 TABLET (300 MG TOTAL) BY MOUTH DAILY.  2  . apixaban (ELIQUIS) 5 MG TABS tablet Take 5 mg by mouth daily.     . B Complex-C (B-COMPLEX WITH VITAMIN C) tablet Take 1 tablet by mouth daily.    . calcium-vitamin D (OSCAL WITH D) 500-200 MG-UNIT TABS Take 1 tablet by mouth daily.   3  . folic acid (FOLVITE) 1 MG tablet Take 1 mg by mouth every morning.     . furosemide (LASIX) 40 MG tablet Take 40 mg by mouth daily.    . hydroxychloroquine (PLAQUENIL) 200 MG tablet Take 200 mg by mouth 2 (two) times daily.   3  . metFORMIN (GLUCOPHAGE) 500 MG tablet Take 500 mg by mouth 2 (two) times  daily.     . methotrexate (RHEUMATREX) 2.5 MG tablet Take 20 mg by mouth every Monday. Caution:Chemotherapy. Protect from light.    . metoprolol tartrate (LOPRESSOR) 25 MG tablet Take 0.5 tablets (12.5 mg total) by mouth 2 (two) times daily. 180 tablet 3  . NOVOLOG MIX 70/30 FLEXPEN (70-30) 100 UNIT/ML FlexPen Inject 20-40 Units into the skin 2 (two) times daily with a meal. She takes 20 units in the morning and 40 units in the evening.  5  . OVER THE COUNTER MEDICATION Take 1 tablet by mouth daily. Trace minerals    . oxyCODONE-acetaminophen (PERCOCET/ROXICET) 5-325 MG tablet Take 1 tablet by mouth every 6 (six) hours as needed for severe pain. 10 tablet 0  . potassium chloride SA (K-DUR,KLOR-CON) 20 MEQ tablet Take 0.5 tablets (10 mEq total) by mouth 2 (two) times daily.    . predniSONE (DELTASONE) 5 MG tablet Take 20 mg by mouth daily with breakfast.     No current facility-administered medications for this visit.    Allergies:    Allergies  Allergen Reactions  . Ace Inhibitors Cough  . Lisinopril Cough    Social History:  The patient  reports that she quit smoking about 15 months ago. Her smoking use included Cigarettes. She has a 18 pack-year smoking history. She has never used smokeless tobacco. She reports that she does not drink alcohol or use illicit drugs.   Family history:   Family History  Problem Relation Age of Onset  . Diabetes Other   . Hypertension Mother   . Diabetes Mother   . Hypertension Father   . Diabetes Paternal Grandmother   . Diabetes Sister   . Heart Problems Sister   . Kidney failure Sister   . Hypertension Brother   . Diabetes Brother     ROS:  Please see the history of present illness.  All other systems reviewed and negative.   PHYSICAL EXAM: VS:  BP 140/96 mmHg  Pulse 49  Ht 5\' 2"  (1.575 m)  Wt 111.766 kg (246 lb 6.4 oz)  BMI 45.06 kg/m2  LMP 10/28/2013 Obese, well developed, in no acute distress HEENT: Pupils are equal round react to  light accommodation extraocular movements are intact.  Neck: no JVDNo cervical lymphadenopathy. Cardiac: Irregular rate and rhythm without murmurs rubs or gallops. Lungs:  clear to auscultation bilaterally, no wheezing, rhonchi or rales Abd: soft, nontender, positive bowel sounds all quadrants, Ext: 1+ lower extremity edema.  2+ radial pulses. Skin: warm and dry Neuro:  Grossly normal  ECG:  07/03/15  SR rate 74 normal   ASSESSMENT AND PLAN:  Problem List Items Addressed This Visit    Essential hypertension - Primary   Relevant Medications  furosemide (LASIX) 40 MG tablet     Acute on chronic diastolic CHF:  Stable told to take extra lasix when prednisone dose goes up F/U Dr Eliberto Ivory for rheumatoid Discussed better diet and fluid restriction.     Chronic atrial fibrillation:  Continue rate control. Continue Eliquis  Essential hypertension:  Controlled on lopressor 12.5 mg BID   F/U with me in 6 months   Jenkins Rouge

## 2015-09-17 ENCOUNTER — Encounter: Payer: Self-pay | Admitting: Cardiovascular Disease

## 2015-09-17 ENCOUNTER — Ambulatory Visit (INDEPENDENT_AMBULATORY_CARE_PROVIDER_SITE_OTHER): Payer: Medicaid Other | Admitting: Cardiovascular Disease

## 2015-09-17 VITALS — BP 140/96 | HR 49 | Ht 62.0 in | Wt 246.4 lb

## 2015-09-17 DIAGNOSIS — I10 Essential (primary) hypertension: Secondary | ICD-10-CM | POA: Diagnosis not present

## 2015-09-17 MED ORDER — FUROSEMIDE 40 MG PO TABS: 40.0000 mg | ORAL_TABLET | Freq: Two times a day (BID) | ORAL | Status: DC

## 2015-09-17 NOTE — Patient Instructions (Signed)
Medication Instructions:  Your physician recommends that you continue on your current medications as directed. Please refer to the Current Medication list given to you today.   Labwork: NONE  Testing/Procedures: NONE  Follow-Up: Your physician wants you to follow-up in: 6 months with Dr Nishan. You will receive a reminder letter in the mail two months in advance. If you don't receive a letter, please call our office to schedule the follow-up appointment.  Any Other Special Instructions Will Be Listed Below (If Applicable).     If you need a refill on your cardiac medications before your next appointment, please call your pharmacy.   

## 2015-10-08 DIAGNOSIS — M059 Rheumatoid arthritis with rheumatoid factor, unspecified: Secondary | ICD-10-CM

## 2015-10-08 DIAGNOSIS — M170 Bilateral primary osteoarthritis of knee: Secondary | ICD-10-CM | POA: Insufficient documentation

## 2015-10-08 HISTORY — DX: Rheumatoid arthritis with rheumatoid factor, unspecified: M05.9

## 2015-12-23 ENCOUNTER — Other Ambulatory Visit: Payer: Self-pay | Admitting: Internal Medicine

## 2015-12-23 DIAGNOSIS — Z1231 Encounter for screening mammogram for malignant neoplasm of breast: Secondary | ICD-10-CM

## 2016-01-08 ENCOUNTER — Telehealth: Payer: Self-pay | Admitting: Cardiovascular Disease

## 2016-01-08 NOTE — Telephone Encounter (Signed)
Becky with rheumatology at [facility] called asking about patient having a TB test here. States they gave her a written order for lab draw and patient was coming here to have drawn. Informed Jacqlyn Larsen that we do not draw "outside" labs in our office and that patient would have been instructed of that when she came to have done.  She will call patient and find out where she had this lab work done at.

## 2016-01-08 NOTE — Telephone Encounter (Signed)
New Message  Request a call back to discuss the most recent labs. You may get the voicemail but please give the direct ext.

## 2016-01-10 ENCOUNTER — Encounter (HOSPITAL_COMMUNITY): Payer: Self-pay | Admitting: Family Medicine

## 2016-01-10 ENCOUNTER — Emergency Department (HOSPITAL_COMMUNITY): Payer: Medicaid Other

## 2016-01-10 ENCOUNTER — Emergency Department (HOSPITAL_COMMUNITY)
Admission: EM | Admit: 2016-01-10 | Discharge: 2016-01-10 | Disposition: A | Discharge status: Home / Self Care | Payer: Medicaid Other | Attending: Emergency Medicine | Admitting: Emergency Medicine

## 2016-01-10 DIAGNOSIS — M25422 Effusion, left elbow: Secondary | ICD-10-CM | POA: Diagnosis not present

## 2016-01-10 DIAGNOSIS — M25521 Pain in right elbow: Secondary | ICD-10-CM | POA: Insufficient documentation

## 2016-01-10 DIAGNOSIS — M7989 Other specified soft tissue disorders: Secondary | ICD-10-CM | POA: Diagnosis not present

## 2016-01-10 DIAGNOSIS — M25572 Pain in left ankle and joints of left foot: Secondary | ICD-10-CM | POA: Insufficient documentation

## 2016-01-10 DIAGNOSIS — R630 Anorexia: Secondary | ICD-10-CM | POA: Diagnosis not present

## 2016-01-10 DIAGNOSIS — M25471 Effusion, right ankle: Secondary | ICD-10-CM | POA: Insufficient documentation

## 2016-01-10 DIAGNOSIS — M25562 Pain in left knee: Secondary | ICD-10-CM | POA: Insufficient documentation

## 2016-01-10 DIAGNOSIS — M25522 Pain in left elbow: Secondary | ICD-10-CM | POA: Diagnosis not present

## 2016-01-10 DIAGNOSIS — R0681 Apnea, not elsewhere classified: Secondary | ICD-10-CM | POA: Insufficient documentation

## 2016-01-10 DIAGNOSIS — Z8659 Personal history of other mental and behavioral disorders: Secondary | ICD-10-CM | POA: Insufficient documentation

## 2016-01-10 DIAGNOSIS — E119 Type 2 diabetes mellitus without complications: Secondary | ICD-10-CM | POA: Insufficient documentation

## 2016-01-10 DIAGNOSIS — M25472 Effusion, left ankle: Secondary | ICD-10-CM | POA: Insufficient documentation

## 2016-01-10 DIAGNOSIS — K219 Gastro-esophageal reflux disease without esophagitis: Secondary | ICD-10-CM | POA: Insufficient documentation

## 2016-01-10 DIAGNOSIS — M79644 Pain in right finger(s): Secondary | ICD-10-CM | POA: Diagnosis not present

## 2016-01-10 DIAGNOSIS — M25462 Effusion, left knee: Secondary | ICD-10-CM | POA: Insufficient documentation

## 2016-01-10 DIAGNOSIS — R1010 Upper abdominal pain, unspecified: Secondary | ICD-10-CM | POA: Diagnosis present

## 2016-01-10 DIAGNOSIS — I481 Persistent atrial fibrillation: Secondary | ICD-10-CM | POA: Insufficient documentation

## 2016-01-10 DIAGNOSIS — M25571 Pain in right ankle and joints of right foot: Secondary | ICD-10-CM | POA: Insufficient documentation

## 2016-01-10 DIAGNOSIS — I5032 Chronic diastolic (congestive) heart failure: Secondary | ICD-10-CM | POA: Insufficient documentation

## 2016-01-10 DIAGNOSIS — M25421 Effusion, right elbow: Secondary | ICD-10-CM | POA: Diagnosis not present

## 2016-01-10 DIAGNOSIS — R079 Chest pain, unspecified: Secondary | ICD-10-CM | POA: Diagnosis not present

## 2016-01-10 DIAGNOSIS — Z8669 Personal history of other diseases of the nervous system and sense organs: Secondary | ICD-10-CM | POA: Diagnosis not present

## 2016-01-10 DIAGNOSIS — M25561 Pain in right knee: Secondary | ICD-10-CM | POA: Diagnosis not present

## 2016-01-10 DIAGNOSIS — M25461 Effusion, right knee: Secondary | ICD-10-CM | POA: Insufficient documentation

## 2016-01-10 DIAGNOSIS — I10 Essential (primary) hypertension: Secondary | ICD-10-CM | POA: Insufficient documentation

## 2016-01-10 DIAGNOSIS — M549 Dorsalgia, unspecified: Secondary | ICD-10-CM | POA: Diagnosis not present

## 2016-01-10 DIAGNOSIS — R062 Wheezing: Secondary | ICD-10-CM | POA: Insufficient documentation

## 2016-01-10 DIAGNOSIS — M069 Rheumatoid arthritis, unspecified: Secondary | ICD-10-CM | POA: Diagnosis not present

## 2016-01-10 DIAGNOSIS — Z7984 Long term (current) use of oral hypoglycemic drugs: Secondary | ICD-10-CM | POA: Diagnosis not present

## 2016-01-10 DIAGNOSIS — M79645 Pain in left finger(s): Secondary | ICD-10-CM | POA: Diagnosis not present

## 2016-01-10 DIAGNOSIS — Z87891 Personal history of nicotine dependence: Secondary | ICD-10-CM | POA: Insufficient documentation

## 2016-01-10 DIAGNOSIS — E669 Obesity, unspecified: Secondary | ICD-10-CM | POA: Diagnosis not present

## 2016-01-10 LAB — BASIC METABOLIC PANEL
Anion gap: 15 (ref 5–15)
BUN: 19 mg/dL (ref 6–20)
CALCIUM: 9.2 mg/dL (ref 8.9–10.3)
CO2: 20 mmol/L — AB (ref 22–32)
CREATININE: 0.85 mg/dL (ref 0.44–1.00)
Chloride: 104 mmol/L (ref 101–111)
GFR calc Af Amer: >60 mL/min (ref >60)
GLUCOSE: 177 mg/dL — AB (ref 65–99)
Potassium: 4 mmol/L (ref 3.5–5.1)
Sodium: 139 mmol/L (ref 135–145)

## 2016-01-10 LAB — CBC
HCT: 36.3 % (ref 36.0–46.0)
Hemoglobin: 12.1 g/dL (ref 12.0–15.0)
MCH: 27.9 pg (ref 26.0–34.0)
MCHC: 33.3 g/dL (ref 30.0–36.0)
MCV: 83.8 fL (ref 78.0–100.0)
PLATELETS: 309 10*3/uL (ref 150–400)
RBC: 4.33 MIL/uL (ref 3.87–5.11)
RDW: 15.3 % (ref 11.5–15.5)
WBC: 7.7 10*3/uL (ref 4.0–10.5)

## 2016-01-10 LAB — BRAIN NATRIURETIC PEPTIDE: B Natriuretic Peptide: 241.5 pg/mL — ABNORMAL HIGH (ref 0.0–100.0)

## 2016-01-10 LAB — I-STAT TROPONIN, ED: TROPONIN I, POC: 0.01 ng/mL (ref 0.00–0.08)

## 2016-01-10 LAB — PROTIME-INR
INR: 1.08 (ref 0.00–1.49)
Prothrombin Time: 14.2 seconds (ref 11.6–15.2)

## 2016-01-10 MED ORDER — FUROSEMIDE 20 MG PO TABS: ORAL_TABLET | ORAL | Status: DC

## 2016-01-10 MED ORDER — ALUM & MAG HYDROXIDE-SIMETH 200-200-20 MG/5ML PO SUSP
15.0000 mL | Freq: Once | ORAL | Status: AC
  Administered 2016-01-10: 15 mL via ORAL
  Filled 2016-01-10: qty 30

## 2016-01-10 MED ORDER — OXYCODONE-ACETAMINOPHEN 5-325 MG PO TABS: 1.0000 | ORAL_TABLET | Freq: Three times a day (TID) | ORAL | Status: DC | PRN

## 2016-01-10 MED ORDER — OMEPRAZOLE 20 MG PO CPDR: 20.0000 mg | DELAYED_RELEASE_CAPSULE | Freq: Every day | ORAL | Status: DC

## 2016-01-10 NOTE — ED Notes (Signed)
Patient arrived in room [phone]

## 2016-01-10 NOTE — ED Notes (Signed)
Pt here for abd pain, all over joint pain and SOB. Sts nausea.

## 2016-01-10 NOTE — ED Provider Notes (Signed)
I saw and evaluated the patient, reviewed the resident's note and I agree with the findings and plan.   EKG Interpretation   Date/Time:  Saturday January 10 2016 08:51:45 EST Ventricular Rate:  81 PR Interval:    QRS Duration: 88 QT Interval:  344 QTC Calculation: 399 R Axis:   46 Text Interpretation:  Atrial fibrillation with premature ventricular or  aberrantly conducted complexes Nonspecific T wave abnormality Abnormal ECG  since last tracing no significant change Confirmed by Nara Paternoster  MD, Rondell Pardon  [id number]) on 01/10/2016 11:13:11 AM       She is here for evaluation of abdominal pain, shortness of breath, and achy joints. She is taking his usual medications, without relief. She states that she ate some "diseased chicken" yesterday. She denies fever, vomiting, weakness or dizziness.  Exam- alert, calm, cooperative. She is obese. Heart regular rate and rhythm, no murmur. Lungs clear to auscultation.  Daleen Bo, MD 01/11/16 0930

## 2016-01-10 NOTE — Discharge Instructions (Signed)
Kathleen Kane. You are having a flare of your rheumatoid arthritis. To address your pain, we will give you some pain medication. However, it is VERY IMPORtANT that you call Dr. Rogers Blocker first thing Monday morning at 8AM to let her know you're having a rheumatoid flare and that you had to go to the emergency room.   You shortness of breath and abdominal pain are likely related to acid reflux. I will prescribe an acid reducer that you will take every day no matter what. You shortness of breath may also be due to your heart failure. Therefore, please take 20 mg lasix (furosemide) three times daily for a week. Afterward, you can go back to 20 mg twice daily, which you indicated was your normal dose.

## 2016-01-10 NOTE — ED Provider Notes (Signed)
CSN: [id number]     Arrival date & time 01/10/16  [id number] History   First MD Initiated Contact with Patient 01/10/16 [phone]     Chief Complaint  Patient presents with  . Abdominal Pain  . Shortness of Breath  . Joint Pain     (Consider location/radiation/quality/duration/timing/severity/associated sxs/prior Treatment) HPI   Kathleen Kane is a 55 year old woman with a PMH of RA (methotrexate, prednisone), chronic atrial fibrillation (metoprolol, no anticoag, CHADS-VASC 4), dCHF (EF 50-55%) T2DM who presents with joint pain/swelling, dyspnea, and upper abdominal pain. She reports that while she has chronic issues with joint pain and abdominal pain at baseline, her symptoms started to acutely worsen last night after having a meal of [employer] fried chicken. She describes her dyspnea has being unsatisfying breaths with some wheezing. She does not use any inhalers at home She has longstanding orthopnea that has worsened. She describes joint pain and swelling in her ankles, knees, elbows, and fingers. She indicates that this is very similar to rheumatoid flares she's had in the past. With respect to her abdominal pain, she says that it is diffuse but concentrated in her upper abdomen. It is sharp but not cramping. She says it is occasionally associated with meals, but not always. Only abdominal surgery is salpingoopherectomy in 2014.   With respect to her RA management. She follows with [facility]. They have been in the process of weaning her steroids, prednisone 10 mg daily. Reviewing prior notes, she apparently has flares if she goes below 15 mg daily. Patient has tried ibuprofen in the past, but "they make her arthritis worse." She had previously been on hydroxychloroquine, but this was stopped due to vision changes last summer. She also takes methotrexate, indicating she takes "8 every Sunday." For her Afib, she declines to take Eliquis after a "doctor on YouTube" told her to  stop.  Past Medical History  Diagnosis Date  . Atrial fibrillation, persistent ([place])   . CHF (congestive heart failure) ([place]) 2013    No echo found from that time, most likely diastolic  . Diabetes mellitus without complication ([place])   . Hypertension   . Depression   . Anxiety   . Rheumatoid arthritis ([place])   . Obesity   . Obstructive sleep apnea     Sleep study performed 10/18/2008. AHI-6.8/hr, during REM-27.3/hr. RDI-25.0/hr, during REM-40.9/hr. avg o2 sat during REM and NREM 95%   Past Surgical History  Procedure Laterality Date  . Cesarean section      x2  . Left breast cyst    . Laparotomy Right 02/27/2013    Procedure: EXPLORATORY LAPAROTOMY, right salpingo-oopherectomy;  Surgeon: [name], MD;  Location: [facility];  Service: Gynecology;  Laterality: Right;  . Salpingoophorectomy Right 02/27/2013    Procedure: SALPINGO OOPHORECTOMY;  Surgeon: [name], MD;  Location: [facility];  Service: Gynecology;  Laterality: Right;  . Cardiovascular stress test  02/16/2013    no significant EKG changes with Lexiscan, normal LV function and normal wall function  . Doppler echocardiography  01/24/2008    EF 55-60%, no diagnostic evidence of LV wall motion abnormalities, LV wall thickness was mild-moderately increased.   Family History  Problem Relation Age of Onset  . Diabetes Other   . Hypertension Mother   . Diabetes Mother   . Hypertension Father   . Diabetes Paternal Grandmother   . Diabetes Sister   . Heart Problems Sister   . Kidney failure Sister   . Hypertension  Brother   . Diabetes Brother    Social History  Substance Use Topics  . Smoking status: Former Smoker -- 0.75 packs/day for 24 years    Types: Cigarettes    Quit date: 06/08/2014  . Smokeless tobacco: Never Used     Comment: smoking cessation info given.   . Alcohol Use: No   OB History    No data available     Review of Systems  Constitutional: Positive for activity change. Negative for fever, chills  and unexpected weight change.  HENT: Negative for facial swelling and sore throat.   Respiratory: Positive for chest tightness, shortness of breath and wheezing. Negative for cough.   Cardiovascular: Positive for leg swelling. Negative for chest pain.  Gastrointestinal: Positive for nausea and abdominal pain. Negative for vomiting, diarrhea, constipation and blood in stool.  Genitourinary: Negative for dysuria and pelvic pain.  Musculoskeletal: Positive for back pain, joint swelling and arthralgias.  Skin: Negative for rash.  Neurological: Negative for dizziness and headaches.  Psychiatric/Behavioral: Negative for dysphoric mood. The patient is not nervous/anxious.   All other systems reviewed and are negative.     Allergies  Ace inhibitors and Lisinopril  Home Medications   Prior to Admission medications   Medication Sig Start Date End Date Taking? Authorizing Provider  allopurinol (ZYLOPRIM) 300 MG tablet TAKE 1 TABLET (300 MG TOTAL) BY MOUTH DAILY. 04/10/15   Historical Provider, MD  apixaban (ELIQUIS) 5 MG TABS tablet Take 5 mg by mouth daily.     Historical Provider, MD  B Complex-C (B-COMPLEX WITH VITAMIN C) tablet Take 1 tablet by mouth daily.    Historical Provider, MD  calcium-vitamin D (OSCAL WITH D) 500-200 MG-UNIT TABS Take 1 tablet by mouth daily.  03/11/15   Historical Provider, MD  folic acid (FOLVITE) 1 MG tablet Take 1 mg by mouth every morning.     Historical Provider, MD  furosemide (LASIX) 40 MG tablet Take 1 tablet (40 mg total) by mouth 2 (two) times daily. 09/17/15   Josue Hector, MD  hydroxychloroquine (PLAQUENIL) 200 MG tablet Take 200 mg by mouth 2 (two) times daily.  03/10/15   Historical Provider, MD  metFORMIN (GLUCOPHAGE) 500 MG tablet Take 500 mg by mouth 2 (two) times daily.     Historical Provider, MD  methotrexate (RHEUMATREX) 2.5 MG tablet Take 20 mg by mouth every Monday. Caution:Chemotherapy. Protect from light.    Historical Provider, MD  metoprolol  tartrate (LOPRESSOR) 25 MG tablet Take 0.5 tablets (12.5 mg total) by mouth 2 (two) times daily. 10/01/13   Brett Canales, PA-C  NOVOLOG MIX 70/30 FLEXPEN (70-30) 100 UNIT/ML FlexPen Inject 20-40 Units into the skin 2 (two) times daily with a meal. She takes 20 units in the morning and 40 units in the evening. 03/14/15   Historical Provider, MD  OVER THE COUNTER MEDICATION Take 1 tablet by mouth daily. Trace minerals    Historical Provider, MD  oxyCODONE-acetaminophen (PERCOCET/ROXICET) 5-325 MG tablet Take 1 tablet by mouth every 6 (six) hours as needed for severe pain. 09/03/15   Ozella Almond Ward, PA-C  potassium chloride SA (K-DUR,KLOR-CON) 20 MEQ tablet Take 0.5 tablets (10 mEq total) by mouth 2 (two) times daily. 07/24/15   Eileen Stanford, PA-C  predniSONE (DELTASONE) 5 MG tablet Take 20 mg by mouth daily with breakfast.    Historical Provider, MD   BP 131/101 mmHg  Pulse 81  Temp(Src) 98 F (36.7 C) (Oral)  Resp 20  SpO2  99%  LMP 10/28/2013 Physical Exam  Constitutional: She appears well-developed. No distress.  Obese.  HENT:  Head: Normocephalic and atraumatic.  Mouth/Throat: Oropharynx is clear and moist. No oropharyngeal exudate.  Eyes: Pupils are equal, round, and reactive to light. No scleral icterus.  Neck: Normal range of motion. Neck supple.  Dorsocervical fat pad.  Cardiovascular:  Irregularly, irregular rhythm. No murmurs or rubs.  Pulmonary/Chest: Effort normal and breath sounds normal. No respiratory distress. She has no wheezes. She has no rales.  Abdominal: Soft. Bowel sounds are normal. She exhibits no distension. There is no tenderness. There is no rebound.  Musculoskeletal: Normal range of motion.  Joint tenderness in swelling in medium and small joints bilaterally. Able to ambulate short distances in the room. 1+ edema to just above ankles.  Lymphadenopathy:    She has no cervical adenopathy.  Neurological: She is alert. She has normal reflexes.  Skin: Skin  is warm and dry. No rash noted. She is not diaphoretic.  Psychiatric: She has a normal mood and affect. Her behavior is normal.  Nursing note and vitals reviewed.   ED Course  Procedures (including critical care time) Labs Review Labs Reviewed  BASIC METABOLIC PANEL - Abnormal; Notable for the following:    CO2 20 (*)    Glucose, Bld 177 (*)    All other components within normal limits  CBC  PROTIME-INR  BRAIN NATRIURETIC PEPTIDE  I-STAT TROPOININ, ED   PRO B NATRIURETIC PEPTIDE (BNP)  Date Value Ref Range Status  07/24/2015 216.0* 0.0 - 100.0 pg/mL Final  04/10/2015 274.0* 0.0 - 100.0 pg/mL Final  05/12/2010 113.0* 0.0 - 100.0 pg/mL Final  01/24/2008 77.0  Final     Imaging Review Dg Chest 2 View  01/10/2016  CLINICAL DATA:  Shortness of breath with abdominal pain for 1 day, history of congestive heart failure and atrial fibrillation EXAM: CHEST  2 VIEW COMPARISON:  03/31/2015 FINDINGS: Moderate cardiac silhouette enlargement. Mild vascular congestion. Mild but increased interstitial prominence bilaterally in the mid to lower lung zones. No consolidation or definite effusion. IMPRESSION: Cardiomegaly with vascular congestion and possible minimal interstitial pulmonary edema. Electronically Signed   By: Skipper Cliche M.D.   On: 01/10/2016 10:15   I have personally reviewed and evaluated these images and lab results as part of my medical decision-making.   EKG Interpretation None      MDM   Final diagnoses:  None   Her dyspnea and upper abdominal pain after a fatty meal in the context of chronic steroid use could be attributed to GERD. Will give Maalox once here with PPI on discharge. No wheezing on exam and she is satting 99% on room air. dCHF in the setting of Afib could be a contributing factor in the setting of her orthopnea-like symptoms, vascular congestion on CXR.. The patient reports taking furosemide 1/2 tablet of 40 mg furosemide twice daily; therefore, for a  week, she can take 20 mg TID for 1 week.  With respect to her rheumatoid flare, it would not be appropriate to adjust her DMARDs at this time. I attempted to set up an appt with her rheumatoid office, but it was closed. I encouraged her to call her rheumatologist [facility] first thing Monday morning. To cover her, I will prescribe a short course of percocet (10 pills), as she indicates that NSAIDs tend to worsen her pain. She was discharged from the ED in good condition.    Liberty Handy, MD 01/10/16 [place]  Eulis Foster, MD 01/11/16 0930

## 2016-01-10 NOTE — Care Management Note (Signed)
Case Management Note  Patient Details  Name: Kathleen Kane MRN: [id number] Date of Birth: Dec 19, 1960  Subjective/Objective:   55 y.o. F who is covered by Medicaid. Unable to [facility] this pt. CM did offer GoodRx to EDP which allows pt to get her OTC codeine based cough syrup for $8.00 vs $20.00.                  Action/Plan: No further CM needs.    Expected Discharge Date:                  Expected Discharge Plan:     In-House Referral:     Discharge planning Services  CM Consult, Medication Assistance  Post Acute Care Choice:    Choice offered to:     DME Arranged:    DME Agency:     HH Arranged:    [place] Agency:     Status of Service:  Completed, signed off  Medicare Important Message Given:    Date Medicare IM Given:    Medicare IM give by:    Date Additional Medicare IM Given:    Additional Medicare Important Message give by:     If discussed at [place] of Stay Meetings, dates discussed:    Additional Comments:  Delrae Sawyers, RN 01/10/2016, 10:19 AM

## 2016-01-21 ENCOUNTER — Ambulatory Visit: Payer: Medicaid Other | Attending: Internal Medicine

## 2016-01-21 VITALS — BP 132/82 | HR 84

## 2016-01-21 DIAGNOSIS — R531 Weakness: Secondary | ICD-10-CM | POA: Insufficient documentation

## 2016-01-21 DIAGNOSIS — M25562 Pain in left knee: Secondary | ICD-10-CM | POA: Insufficient documentation

## 2016-01-21 DIAGNOSIS — M25511 Pain in right shoulder: Secondary | ICD-10-CM | POA: Insufficient documentation

## 2016-01-21 DIAGNOSIS — M545 Low back pain, unspecified: Secondary | ICD-10-CM

## 2016-01-21 DIAGNOSIS — E669 Obesity, unspecified: Secondary | ICD-10-CM | POA: Insufficient documentation

## 2016-01-21 DIAGNOSIS — R262 Difficulty in walking, not elsewhere classified: Secondary | ICD-10-CM

## 2016-01-21 DIAGNOSIS — M25512 Pain in left shoulder: Secondary | ICD-10-CM | POA: Insufficient documentation

## 2016-01-21 DIAGNOSIS — M05712 Rheumatoid arthritis with rheumatoid factor of left shoulder without organ or systems involvement: Secondary | ICD-10-CM | POA: Insufficient documentation

## 2016-01-21 DIAGNOSIS — G894 Chronic pain syndrome: Secondary | ICD-10-CM | POA: Diagnosis present

## 2016-01-21 DIAGNOSIS — M05711 Rheumatoid arthritis with rheumatoid factor of right shoulder without organ or systems involvement: Secondary | ICD-10-CM | POA: Diagnosis present

## 2016-01-21 DIAGNOSIS — M25561 Pain in right knee: Secondary | ICD-10-CM | POA: Diagnosis present

## 2016-01-21 NOTE — Patient Instructions (Signed)
Abduction    Slide one leg out to side. Keep kneecap pointing up. Gently bring leg back to pillow. Repeat with other leg. Repeat _10___ times. Do __3__ sessions per day.  http://gt2.exer.us/374   Copyright  VHI. All rights reserved.    Hip Flexion / Knee Extension: Straight-Leg Raise (Eccentric)   Lie on back. Lift leg with knee straight. Slowly lower leg for 3-5 seconds. _10__ reps per set, __3_ sets per day  ANKLE: Pumps  Point toes down, then up. _30__ reps per set, __3_ sets per day.  Long [employer]  Straighten operated leg and try to hold it __5__ seconds. Repeat __10__ times. Do __3_ sessions a day.  FLEXION: Sitting (Active)  Sit, both feet flat. Lift right knee toward ceiling.  Complete __3_ sets of __10_ repetitions. Perform _3__ sessions per day.  Abduction / Adduction  Feet hip width apart, spread thighs out, then bring thighs together. Repeat _10__ times each direction. Do _3__ sessions per day.  Copyright  VHI. All rights reserved.

## 2016-01-21 NOTE — Therapy (Signed)
[place], [place], [address] Phone: [phone]   Fax:  [phone]  Physical Therapy Evaluation  Patient Details  Name: Kathleen Kane MRN: [id number] Date of Birth: 1961-03-25 Referring Provider: Dr [name]  Encounter Date: 01/21/2016      PT End of Session - 01/21/16 1419    Visit Number 1   Number of Visits 1   Authorization Type M CAID one visit only    PT Start Time 1330   PT Stop Time 1415   PT Time Calculation (min) 45 min   Activity Tolerance Patient tolerated treatment well   Behavior During Therapy [facility] for tasks assessed/performed      Past Medical History  Diagnosis Date  . Atrial fibrillation, persistent ([place])   . CHF (congestive heart failure) ([place]) 2013    No echo found from that time, most likely diastolic  . Diabetes mellitus without complication ([place])   . Hypertension   . Depression   . Anxiety   . Rheumatoid arthritis ([place])   . Obesity   . Obstructive sleep apnea     Sleep study performed 10/18/2008. AHI-6.8/hr, during REM-27.3/hr. RDI-25.0/hr, during REM-40.9/hr. avg o2 sat during REM and NREM 95%    Past Surgical History  Procedure Laterality Date  . Cesarean section      x2  . Left breast cyst    . Laparotomy Right 02/27/2013    Procedure: EXPLORATORY LAPAROTOMY, right salpingo-oopherectomy;  Surgeon: [name], MD;  Location: [facility];  Service: Gynecology;  Laterality: Right;  . Salpingoophorectomy Right 02/27/2013    Procedure: SALPINGO OOPHORECTOMY;  Surgeon: [name], MD;  Location: [facility];  Service: Gynecology;  Laterality: Right;  . Cardiovascular stress test  02/16/2013    no significant EKG changes with Lexiscan, normal LV function and normal wall function  . Doppler echocardiography  01/24/2008    EF 55-60%, no diagnostic evidence of LV wall motion abnormalities, LV wall thickness was mild-moderately increased.    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 Pt was referred to PT by PCP, Dr Jeanie Cooks.   Pertinent History Afib, CHF, HTN, depression, anxiety, diabetes., obesity   Limitations Standing;Writing;Walking;House hold activities;Lifting;Sitting   Currently in Pain? Yes   Pain Score 7    Pain Location Other (Comment)  "all over"   Pain Type Chronic pain   Pain Onset More than a month ago   Pain Frequency Constant   Aggravating Factors  activity, worse in AM             [facility] PT Assessment - 01/21/16 0001    Assessment   Medical Diagnosis RA- chronic pain    Referring Provider Dr [name]  Onset Date/Surgical Date 07/24/15   Hand Dominance Right   Next MD Visit unknown    Prior Therapy none    Precautions   Precautions None   Restrictions   Weight Bearing Restrictions No   Balance Screen   Has the patient fallen in the past 6 months Yes   How many times? 1   Has the patient had a decrease in activity level because of a fear of falling?  No   Is the patient reluctant to leave their home because of a fear of falling?  No   Home Ecologist residence   Prior Function   Level of Independence Needs assistance with ADLs  assist with dressing, bathing, grooming, food prep,  housewor   Cognition   Overall Cognitive Status Within Functional Limits for tasks assessed   ROM / Strength   AROM / PROM / Strength Strength  Bilat Shoulder AROM 50% limited throguhout    Strength   Strength Assessment Site Hip;Knee;Ankle   Right/Left Hip Right;Left   Right Hip Flexion 3/5  extensor lag 10 degrees   Right Hip ABduction 2+/5   Left Hip Flexion 3+/5   Left Hip ABduction 2/5   Right/Left Knee Right;Left   Right Knee Flexion 4-/5   Right Knee Extension 4-/5   Left Knee Flexion 4-/5   Left Knee Extension 4-/5   Right/Left Ankle Right;Left   Right Ankle Dorsiflexion 4/5   Left Ankle Dorsiflexion 4/5   Ambulation/Gait   Ambulation/Gait --  antalgic without A.D.                    Hulan Fess Adult PT Treatment/Exercise - 01/21/16 0001    Self-Care   Self-Care --  see self care section    Exercises   Exercises Knee/Hip;Ankle  shoulder : Rows with red theraband 10 x red theraband.    Knee/Hip Exercises: Seated   Long Arc Quad 10 reps  HEP   Marching 10 reps  HEP   Abduction/Adduction  10 reps  HEP   Abd/Adduction Limitations ANKLE PUMPS 30 x   HEP   Knee/Hip Exercises: Supine   Straight Leg Raises 10 reps  HEP   Other Supine Knee/Hip Exercises hip ABD 10 x (HEP)                 PT Education - 01/21/16 1404    Education provided Yes   Education Details MCAID one visit only. Prescrided HEP: seated : AP, LAQ, marches, hip ABD/ADD. Supine: hip ABD, SLR. theraband rows: red. Red band given.  Importance of posture throughout day, ADLs, sitting, and probable role that it plays in radicular pain into R UE.    Person(s) Educated Patient   Methods Explanation;Demonstration;Handout   Comprehension Verbalized understanding;Returned demonstration          PT Short Term Goals - 01/21/16 1424    PT SHORT TERM GOAL #1   Title Establish a HEP by 01/21/16.    Time 1   Period Days   Status Achieved   PT SHORT TERM GOAL #2   Title Educate pt on  importance of posture throughout the day and roll it plays in radicular symptoms into R UE.    Time 1   Period Days   Status Achieved                  Plan - 01/21/16 1420    Clinical Impression Statement Pt presents for low complexity evaluation  for chronic pain. Pt is diagnosed with rheumatoid arthritis and attributes this to her pain.  Pt presents with impairments including pain, impaired mobility/ROM, and impaired strength, which limit pt's functional abilities with sitting, standing, walking, grooming, ADLs. Pt will benefit from oupt PT, but is only approved for 1 visit  to establish a HEP due to Medicaid.  Therefore, pt is evaluation only with established HEP.    Pt will benefit from skilled therapeutic intervention in order to improve on the following deficits Abnormal gait;Decreased activity tolerance;Decreased coordination;Decreased endurance;Decreased range of motion;Difficulty walking;Decreased balance;Decreased mobility;Decreased strength;Postural dysfunction;Improper body mechanics;Pain;Obesity;Increased muscle spasms;Impaired UE functional use   Rehab Potential Poor   Clinical Impairments Affecting Rehab Potential obsity, RA, only 1 visit approved.    PT Frequency 1x / week   PT Treatment/Interventions ADLs/Self Care Home Management;Therapeutic exercise   PT Next Visit Plan NA   PT Home Exercise Plan  Prescrided HEP: seated : AP, LAQ, marches, hip ABD/ADD. Supine: hip ABD, SLR. theraband rows: red. Red band given.     Consulted and Agree with Plan of Care Patient         Problem List Patient Active Problem List   Diagnosis Date Noted  . Acute on chronic diastolic heart failure ([place]) 08/08/2015  . Morbid obesity ([place]) 05/06/2015  . Essential hypertension 04/10/2015  . Numbness and tingling in left hand 10/01/2013  . Tobacco abuse 10/01/2013  . Elevated CA-125 02/06/2013  . Pelvic mass in female 01/29/2013  . Diabetes mellitus ([place]) 08/10/2012  . Atrial  fibrillation ([place])   . Chronic diastolic CHF (congestive heart failure) ([place])     Dollene Cleveland, PT 01/21/2016, 2:38 PM  [facility] [address] [place], [place], [address] Phone: [phone]   Fax:  [phone]  Name: Kathleen Kane MRN: [id number] Date of Birth: 1961-04-16

## 2016-03-14 NOTE — Progress Notes (Signed)
Patient ID: Kathleen Kane, female   DOB: 1961-03-31, 55 y.o.   MRN: [id number]    Date:  03/15/2016   ID:  Kathleen Kane, DOB 24-Jun-1961, MRN [id number]   PCP:  Philis Fendt, MD  Primary Cardiologist:  Johnsie Cancel   Chief complaint: Heart failure follow-up   History of Present Illness: [name] is a 55 y.o. female with a hx of chronic A. fib, diastolic HF, OSA (does not use CPAP), HTN, DM, RA, obesity and previous tobacco abuse who presented on September 15 for evaluation of dyspnea, LE edema and orthopnea.  She has had poor adherence to medications in the past. . Patient was seen in this office by Rosaria Ferries, PA-C in 12/2014. She was volume overloaded and her Lasix was adjusted. She was also placed on Xarelto as she had Medicaid and ability to afford NOACs. Echocardiogram demonstrated normal LV function, moderate LAE, mild MR, PASP 33 mmHg. She is on prednisone which she is trying to taper off of, methotrexate and plaquenil for rheumatoid arthritis. She has chronic atrial fibrillation but had discontinued Xarelto several months ago because of information stating that it may cause death. She was seen by Richardson Dopp PA-C on 04/10/15 and reported that she woke up that morning with visual disturbance in the right eye. She described a pink and yellow jagged circle. CT of the head was performed that day, which was unremarkable. She has since been started on Eliquis. She was later seen by neurology who felt her sx were more consistent with visual migraine aura.   In ER 09/03/15  with Rheumatoid flare.  Below 15 mg prednisone she does poorly.  Also on methotrexate  BNP recently only 216   The patient currently denies nausea, vomiting, fever, chest pain, dizziness, PND, cough, congestion, abdominal pain, hematochezia, melena,  Claudication. Not taking lasix and eliquis correctly.  She is in a wheel chair from arthritis , rheumatoid and gout. Trying to get off prednisone on Humera, Plaquenil  and Methotrexate Sees Dr Reinaldo Meeker Readings from Last 3 Encounters:  09/17/15 111.766 kg (246 lb 6.4 oz)  09/03/15 115.214 kg (254 lb)  08/20/15 115.214 kg (254 lb)     Past Medical History  Diagnosis Date  . Atrial fibrillation, persistent ([place])   . CHF (congestive heart failure) ([place]) 2013    No echo found from that time, most likely diastolic  . Diabetes mellitus without complication ([place])   . Hypertension   . Depression   . Anxiety   . Rheumatoid arthritis ([place])   . Obesity   . Obstructive sleep apnea     Sleep study performed 10/18/2008. AHI-6.8/hr, during REM-27.3/hr. RDI-25.0/hr, during REM-40.9/hr. avg o2 sat during REM and NREM 95%    Current Outpatient Prescriptions  Medication Sig Dispense Refill  . allopurinol (ZYLOPRIM) 300 MG tablet TAKE 1 TABLET (300 MG TOTAL) BY MOUTH DAILY.  2  . apixaban (ELIQUIS) 5 MG TABS tablet Take 5 mg by mouth daily.     . calcium-vitamin D (OSCAL WITH D) 500-200 MG-UNIT TABS Take 1 tablet by mouth daily.   3  . folic acid (FOLVITE) 1 MG tablet Take 1 mg by mouth every morning.     . furosemide (LASIX) 20 MG tablet Take 20 mg three times daily for a week, then take 20 mg twice a day after thereon. 45 tablet 0  . glipiZIDE (GLUCOTROL) 5 MG tablet Take 10 mg by mouth every morning.    . hydroxychloroquine (  PLAQUENIL) 200 MG tablet Take 200 mg by mouth 2 (two) times daily.   3  . metFORMIN (GLUCOPHAGE) 500 MG tablet Take 500 mg by mouth 2 (two) times daily.     . methotrexate (RHEUMATREX) 2.5 MG tablet Take 20 mg by mouth every Monday. Caution:Chemotherapy. Protect from light.    . metoprolol tartrate (LOPRESSOR) 25 MG tablet Take 0.5 tablets (12.5 mg total) by mouth 2 (two) times daily. 180 tablet 3  . NOVOLOG MIX 70/30 FLEXPEN (70-30) 100 UNIT/ML FlexPen Inject 20-40 Units into the skin 2 (two) times daily with a meal. She takes 20 units in the morning and 40 units in the evening.  5  . omeprazole (PRILOSEC) 20 MG capsule Take 1 capsule (20  mg total) by mouth daily. 30 capsule 1  . oxyCODONE-acetaminophen (ROXICET) 5-325 MG tablet Take 1 tablet by mouth every 8 (eight) hours as needed for severe pain. 10 tablet 0  . potassium chloride SA (K-DUR,KLOR-CON) 20 MEQ tablet Take 0.5 tablets (10 mEq total) by mouth 2 (two) times daily.    . predniSONE (DELTASONE) 5 MG tablet Take 10 mg by mouth daily with breakfast.      No current facility-administered medications for this visit.    Allergies:    Allergies  Allergen Reactions  . Ace Inhibitors Cough  . Lisinopril Cough    Social History:  The patient  reports that she quit smoking about 21 months ago. Her smoking use included Cigarettes. She has a 18 pack-year smoking history. She has never used smokeless tobacco. She reports that she does not drink alcohol or use illicit drugs.   Family history:   Family History  Problem Relation Age of Onset  . Diabetes Other   . Hypertension Mother   . Diabetes Mother   . Hypertension Father   . Diabetes Paternal Grandmother   . Diabetes Sister   . Heart Problems Sister   . Kidney failure Sister   . Hypertension Brother   . Diabetes Brother     ROS:  Please see the history of present illness.  All other systems reviewed and negative.   PHYSICAL EXAM: VS:  BP 120/60 mmHg  Pulse 60  Ht 5\' 2"  (1.575 m)  SpO2 95%  LMP 10/28/2013 Obese, well developed, in no acute distressCushingoid  HEENT: Pupils are equal round react to light accommodation extraocular movements are intact.  Neck: no JVDNo cervical lymphadenopathy. Cardiac: Irregular rate and rhythm without murmurs rubs or gallops. Lungs:  clear to auscultation bilaterally, no wheezing, rhonchi or rales Abd: soft, nontender, positive bowel sounds all quadrants, Ext: 1+ lower extremity edema.  2+ radial pulses. Skin: warm and dry Neuro:  Grossly normal  ECG:  01/16/16  afib rate 81 nonspecific ST changes   ASSESSMENT AND PLAN:  Problem List Items Addressed This Visit     Chronic diastolic CHF (congestive heart failure) (HCC) - Primary   Essential hypertension     Acute on chronic diastolic CHF:  Stable told to take extra lasix when prednisone dose goes up F/U Dr Eliberto Ivory for rheumatoid Discussed better diet and fluid restriction.  She has not been compliant with her lasix.  Check BNP/BMET today    Chronic atrial fibrillation:  Continue rate control. Continue Eliquis She has only been taking it one/day and discussed need for bid dosing   Essential hypertension:  Controlled on lopressor 12.5 mg BID   F/U with me in 6 months   Jenkins Rouge

## 2016-03-15 ENCOUNTER — Ambulatory Visit (INDEPENDENT_AMBULATORY_CARE_PROVIDER_SITE_OTHER): Payer: Medicaid Other | Admitting: Podiatry

## 2016-03-15 ENCOUNTER — Encounter: Payer: Self-pay | Admitting: Cardiovascular Disease

## 2016-03-15 ENCOUNTER — Encounter: Payer: Self-pay | Admitting: Podiatry

## 2016-03-15 ENCOUNTER — Ambulatory Visit (INDEPENDENT_AMBULATORY_CARE_PROVIDER_SITE_OTHER): Payer: Medicaid Other | Admitting: Cardiovascular Disease

## 2016-03-15 VITALS — BP 155/96 | HR 90 | Resp 16

## 2016-03-15 VITALS — BP 120/60 | HR 60 | Ht 62.0 in

## 2016-03-15 DIAGNOSIS — M779 Enthesopathy, unspecified: Secondary | ICD-10-CM

## 2016-03-15 DIAGNOSIS — I10 Essential (primary) hypertension: Secondary | ICD-10-CM

## 2016-03-15 DIAGNOSIS — B351 Tinea unguium: Secondary | ICD-10-CM

## 2016-03-15 DIAGNOSIS — I5032 Chronic diastolic (congestive) heart failure: Secondary | ICD-10-CM

## 2016-03-15 LAB — BASIC METABOLIC PANEL
BUN: 20 mg/dL (ref 7–25)
CHLORIDE: 102 mmol/L (ref 98–110)
CO2: 24 mmol/L (ref 20–31)
Calcium: 9.3 mg/dL (ref 8.6–10.4)
Creat: 0.74 mg/dL (ref 0.50–1.05)
Glucose, Bld: 195 mg/dL — ABNORMAL HIGH (ref 65–99)
POTASSIUM: 3.8 mmol/L (ref 3.5–5.3)
Sodium: 138 mmol/L (ref 135–146)

## 2016-03-15 LAB — BRAIN NATRIURETIC PEPTIDE: Brain Natriuretic Peptide: 181.5 pg/mL — ABNORMAL HIGH (ref <100)

## 2016-03-15 MED ORDER — APIXABAN 5 MG PO TABS: 5.0000 mg | ORAL_TABLET | Freq: Two times a day (BID) | ORAL | Status: DC

## 2016-03-15 MED ORDER — FUROSEMIDE 20 MG PO TABS: 20.0000 mg | ORAL_TABLET | Freq: Two times a day (BID) | ORAL | Status: DC

## 2016-03-15 NOTE — Patient Instructions (Signed)
Medication Instructions:  Your physician has recommended you make the following change in your medication:  1-TAKE your Eliquis 5 mg by mouth twice daily 2-TAKE your Lasix (Furosemide) 20 mg by mouth twice daily. You can take an extra one if needed for edema.  Labwork: Your physician recommends that you have lab work today BMET and BNP  Testing/Procedures: NONE  Follow-Up: Your physician wants you to follow-up in: 6 months with Dr. Johnsie Cancel. You will receive a reminder letter in the mail two months in advance. If you don't receive a letter, please call our office to schedule the follow-up appointment.    If you need a refill on your cardiac medications before your next appointment, please call your pharmacy.

## 2016-03-15 NOTE — Progress Notes (Signed)
   Subjective:    Patient ID: Kathleen Kane, female    DOB: 1960/12/16, 55 y.o.   MRN: [id number]  HPI    Review of Systems  All other systems reviewed and are negative.      Objective:   Physical Exam        Assessment & Plan:

## 2016-03-16 NOTE — Progress Notes (Signed)
Subjective:     Patient ID: Kathleen Kane, female   DOB: Mar 04, 1961, 55 y.o.   MRN: [id number]  HPI patient presents concerned with her diabetes bilateral nail disease and whether she may have stepped on something on her right foot with a foreign body   Review of Systems     Objective:   Physical Exam Neurovascular status unchanged from previous visit with muscle strength adequate range of motion mildly reduced subtalar midtarsal joint. She does have diminished sharp [employer] vibratory and is noted to have slight trauma right but I did not note any active drainage redness swelling or indications of foreign body    Assessment:     Some form of trauma that may have occurred with no apparent injury at this time    Plan:     Advised her on daily inspections anti-inflammatories physical therapy and supportive shoes and not going barefoot. Patient will be seen back as needed

## 2016-04-08 ENCOUNTER — Emergency Department (HOSPITAL_COMMUNITY)
Admission: EM | Admit: 2016-04-08 | Discharge: 2016-04-08 | Disposition: A | Discharge status: Home / Self Care | Payer: Medicaid Other | Attending: Emergency Medicine | Admitting: Emergency Medicine

## 2016-04-08 ENCOUNTER — Encounter (HOSPITAL_COMMUNITY): Payer: Self-pay | Admitting: Emergency Medicine

## 2016-04-08 DIAGNOSIS — T50905A Adverse effect of unspecified drugs, medicaments and biological substances, initial encounter: Secondary | ICD-10-CM

## 2016-04-08 DIAGNOSIS — I10 Essential (primary) hypertension: Secondary | ICD-10-CM | POA: Insufficient documentation

## 2016-04-08 DIAGNOSIS — Z7901 Long term (current) use of anticoagulants: Secondary | ICD-10-CM | POA: Diagnosis not present

## 2016-04-08 DIAGNOSIS — Z87891 Personal history of nicotine dependence: Secondary | ICD-10-CM | POA: Diagnosis not present

## 2016-04-08 DIAGNOSIS — E669 Obesity, unspecified: Secondary | ICD-10-CM | POA: Diagnosis not present

## 2016-04-08 DIAGNOSIS — Z7984 Long term (current) use of oral hypoglycemic drugs: Secondary | ICD-10-CM | POA: Insufficient documentation

## 2016-04-08 DIAGNOSIS — E119 Type 2 diabetes mellitus without complications: Secondary | ICD-10-CM | POA: Diagnosis not present

## 2016-04-08 DIAGNOSIS — D849 Immunodeficiency, unspecified: Secondary | ICD-10-CM | POA: Diagnosis not present

## 2016-04-08 DIAGNOSIS — I509 Heart failure, unspecified: Secondary | ICD-10-CM | POA: Diagnosis not present

## 2016-04-08 DIAGNOSIS — Z7952 Long term (current) use of systemic steroids: Secondary | ICD-10-CM | POA: Diagnosis not present

## 2016-04-08 DIAGNOSIS — T451X5A Adverse effect of antineoplastic and immunosuppressive drugs, initial encounter: Secondary | ICD-10-CM | POA: Diagnosis not present

## 2016-04-08 DIAGNOSIS — Z760 Encounter for issue of repeat prescription: Secondary | ICD-10-CM

## 2016-04-08 DIAGNOSIS — R21 Rash and other nonspecific skin eruption: Secondary | ICD-10-CM

## 2016-04-08 DIAGNOSIS — Z794 Long term (current) use of insulin: Secondary | ICD-10-CM | POA: Insufficient documentation

## 2016-04-08 DIAGNOSIS — Z8659 Personal history of other mental and behavioral disorders: Secondary | ICD-10-CM | POA: Insufficient documentation

## 2016-04-08 DIAGNOSIS — Z79899 Other long term (current) drug therapy: Secondary | ICD-10-CM | POA: Insufficient documentation

## 2016-04-08 DIAGNOSIS — M069 Rheumatoid arthritis, unspecified: Secondary | ICD-10-CM | POA: Insufficient documentation

## 2016-04-08 DIAGNOSIS — L270 Generalized skin eruption due to drugs and medicaments taken internally: Secondary | ICD-10-CM | POA: Insufficient documentation

## 2016-04-08 DIAGNOSIS — I481 Persistent atrial fibrillation: Secondary | ICD-10-CM | POA: Insufficient documentation

## 2016-04-08 DIAGNOSIS — Z8669 Personal history of other diseases of the nervous system and sense organs: Secondary | ICD-10-CM | POA: Insufficient documentation

## 2016-04-08 DIAGNOSIS — R238 Other skin changes: Secondary | ICD-10-CM

## 2016-04-08 MED ORDER — TRIAMCINOLONE ACETONIDE 0.1 % EX CREA: 1.0000 "application " | TOPICAL_CREAM | Freq: Three times a day (TID) | CUTANEOUS | Status: DC | PRN

## 2016-04-08 MED ORDER — FUROSEMIDE 20 MG PO TABS: 20.0000 mg | ORAL_TABLET | Freq: Two times a day (BID) | ORAL | Status: DC

## 2016-04-08 NOTE — Discharge Instructions (Signed)
Use triamcinolone cream as prescribed for itch relief. Use additional antihistamines like benadryl or claritin/zyrtec/etc for relief of itching. Continue your usual home medications. Get plenty of rest and drink plenty of fluids. Avoid any known triggers. Call your rheumatologist tomorrow to ask about what you should do about continuing or stopping your humira, since this is likely a reaction to that medication. Take lasix as directed, you were provided a refill today as a courtesy but you need to have your regular doctor prescribe your chronic medications on a regular basis. Please followup with your primary doctor in 1 week for discussion of your diagnoses and further evaluation after today's visit, and for ongoing refills of medications. Return to the ER for changes or worsening symptoms     Drug Rash A drug rash is a change in the color or texture of the skin that is caused by a drug. It can develop minutes, hours, or days after the person takes the drug. CAUSES This condition is usually caused by a drug allergy. It can also be caused by exposure to sunlight after taking a drug that makes the skin sensitive to light. Drugs that commonly cause rashes include:  Penicillin.  Antibiotic medicines.  Medicines that treat seizures.  Medicines that treat cancer (chemotherapy).  Aspirin and other nonsteroidal anti-inflammatory drugs (NSAIDs).  Injectable dyes that contain iodine.  Insulin. SYMPTOMS Symptoms of this condition include:  Redness.  Tiny bumps.  Peeling.  Itching.  Itchy welts (hives).  Swelling. The rash may appear on a small area of skin or all over the body. DIAGNOSIS To diagnose the condition, your health care provider will do a physical exam. He or she may also order tests to find out which drug caused the rash. Tests to find the cause of a rash include:  Skin tests.  Blood tests.  Drug challenge. For this test, you stop taking all of the drugs that you do not  need to take, and then you start taking them again by adding back one of the drugs at a time. TREATMENT A drug rash may be treated with medicines, including:  Antihistamines. These may be given to relieve itching.  An NSAID. This may be given to reduce swelling and treat pain.  A steroid drug. This may be given to reduce swelling. The rash usually goes away when the person stops taking the drug that caused it. HOME CARE INSTRUCTIONS  Take medicines only as directed by your health care provider.  Let all of your health care providers know about any drug reactions you have had in the past.  If you have hives, take a cool shower or use a cool compress to relieve itchiness. SEEK MEDICAL CARE IF:  You have a fever.  Your rash is not going away.  Your rash gets worse.  Your rash comes back.  You have wheezing or coughing. SEEK IMMEDIATE MEDICAL CARE IF:  You start to have breathing problems.  You start to have shortness of breath.  You face or throat starts to swell.  You have severe weakness with dizziness or fainting.  You have chest pain.   This information is not intended to replace advice given to you by your health care provider. Make sure you discuss any questions you have with your health care provider.   Document Released: 12/02/2004 Document Revised: 11/15/2014 Document Reviewed: 08/21/2014 Elsevier Interactive Patient Education 2016 [place] Refill at the Emergency Department We have refilled your medicine today, but it is best  for you to get refills through your primary health care provider's office. In the future, please plan ahead so you do not need to get refills from the emergency department. If the medicine we refilled was a maintenance medicine, you may have received only enough to get you by until you are able to see your regular health care provider.   This information is not intended to replace advice given to you by your health care  provider. Make sure you discuss any questions you have with your health care provider.   Document Released: 02/11/2004 Document Revised: 11/15/2014 Document Reviewed: 02/01/2014 Elsevier Interactive Patient Education [employer].

## 2016-04-08 NOTE — ED Notes (Signed)
Patient able to ambulate independently  

## 2016-04-08 NOTE — ED Notes (Signed)
Pt has a rash around both ankles, right arm and chest. Pt has ring like rash around both ankles and blisters on left shin. Red raised bumps on right arms and chest.

## 2016-04-08 NOTE — ED Provider Notes (Signed)
CSN: [id number]     Arrival date & time 04/08/16  1948 History  By signing my name below, I, [facility], attest that this documentation has been prepared under the direction and in the presence of Rahcel Shutes Camprubi-Soms, PA-C. Electronically Signed: Virgel Bouquet, ED Scribe. 04/08/2016. 8:51 PM.   Chief Complaint  Patient presents with  . Rash    Patient is a 55 y.o. female presenting with rash. The history is provided by the patient. No language interpreter was used.  Rash Location:  Torso, leg and shoulder/arm Shoulder/arm rash location:  R forearm Torso rash location:  R chest Leg rash location:  L lower leg and R lower leg Quality: blistering, itchiness and redness   Quality: not painful and not weeping   Severity:  Moderate Onset quality:  Gradual Duration:  1 week Timing:  Constant Progression:  Spreading Chronicity:  New Context: medications   Context: not animal contact, not exposure to similar rash, not insect bite/sting, not new detergent/soap, not plant contact and not sick contacts   Relieved by:  Anti-itch cream Worsened by:  Nothing tried Ineffective treatments:  None tried Associated symptoms: no abdominal pain, no diarrhea, no fever, no induration, no myalgias, no nausea, no shortness of breath, no throat swelling, no tongue swelling, not vomiting and not wheezing   HPI Comments: KORYN FRETZ is a 55 y.o. female with a PMHx of atrial fibrillation, CHF, DM2, HTN, RA, and OSA, who presents to the Emergency Department complaining of constant, gradually worsening, pruritic erythematous rash to her bilateral ankles, lower left leg, right wrist, and chest onset 1 week ago with associated blister formation to L shin, improved somewhat with OTC hydrocortisone cream and with no known exacerbating factors. Pt states that she was recently prescribed Humira by her rheumatologist for her RA, and noticed that the rash developed after she began taking this medication.  Denies contact with people with similar rashes. Denies changes in soaps, lotions, and detergents. Denies outdoor exposure or plant/animal contacts. Denies pain to the rash, leg swelling different from baseline, drainage from the rash, red streaking from the rash, tongue or lip swelling, fevers, chills, CP, SOB, wheezing, abd pain, new/different nausea, vomiting, diarrhea, constipation, dysuria, hematuria, myalgias, new numbness/tingling, or focal weakness.  Additionally, she states she lost her lasix rx and needs a refill of this today.    Past Medical History  Diagnosis Date  . Atrial fibrillation, persistent ([place])   . CHF (congestive heart failure) ([place]) 2013    No echo found from that time, most likely diastolic  . Diabetes mellitus without complication ([place])   . Hypertension   . Depression   . Anxiety   . Rheumatoid arthritis ([place])   . Obesity   . Obstructive sleep apnea     Sleep study performed 10/18/2008. AHI-6.8/hr, during REM-27.3/hr. RDI-25.0/hr, during REM-40.9/hr. avg o2 sat during REM and NREM 95%   Past Surgical History  Procedure Laterality Date  . Cesarean section      x2  . Left breast cyst    . Laparotomy Right 02/27/2013    Procedure: EXPLORATORY LAPAROTOMY, right salpingo-oopherectomy;  Surgeon: [name], MD;  Location: [facility];  Service: Gynecology;  Laterality: Right;  . Salpingoophorectomy Right 02/27/2013    Procedure: SALPINGO OOPHORECTOMY;  Surgeon: [name], MD;  Location: [facility];  Service: Gynecology;  Laterality: Right;  . Cardiovascular stress test  02/16/2013    no significant EKG changes with Lexiscan, normal LV function and normal wall function  .  Doppler echocardiography  01/24/2008    EF 55-60%, no diagnostic evidence of LV wall motion abnormalities, LV wall thickness was mild-moderately increased.   Family History  Problem Relation Age of Onset  . Diabetes Other   . Hypertension Mother   . Diabetes Mother   . Hypertension Father   .  Diabetes Paternal Grandmother   . Diabetes Sister   . Heart Problems Sister   . Kidney failure Sister   . Hypertension Brother   . Diabetes Brother    Social History  Substance Use Topics  . Smoking status: Former Smoker -- 0.75 packs/day for 24 years    Types: Cigarettes    Quit date: 06/08/2014  . Smokeless tobacco: Never Used     Comment: smoking cessation info given.   . Alcohol Use: No   OB History    No data available     Review of Systems  Constitutional: Negative for fever and chills.  HENT: Negative for facial swelling.   Respiratory: Negative for shortness of breath and wheezing.   Cardiovascular: Negative for chest pain.  Gastrointestinal: Negative for nausea, vomiting, abdominal pain, diarrhea and constipation.  Genitourinary: Negative for dysuria and hematuria.  Musculoskeletal: Negative for myalgias.  Skin: Positive for color change and rash.  Allergic/Immunologic: Positive for immunocompromised state (diabetic).  Neurological: Negative for weakness and numbness.  Psychiatric/Behavioral: Negative for confusion.  10 Systems reviewed and all are negative for acute change except as noted in the HPI.    Allergies  Ace inhibitors and Lisinopril  Home Medications   Prior to Admission medications   Medication Sig Start Date End Date Taking? Authorizing Provider  allopurinol (ZYLOPRIM) 300 MG tablet TAKE 1 TABLET (300 MG TOTAL) BY MOUTH DAILY. 04/10/15   Historical Provider, MD  apixaban (ELIQUIS) 5 MG TABS tablet Take 1 tablet (5 mg total) by mouth 2 (two) times daily. 03/15/16   Josue Hector, MD  calcium-vitamin D (OSCAL WITH D) 500-200 MG-UNIT TABS Take 1 tablet by mouth daily.  03/11/15   Historical Provider, MD  folic acid (FOLVITE) 1 MG tablet Take 1 mg by mouth every morning.     Historical Provider, MD  furosemide (LASIX) 20 MG tablet Take 1 tablet (20 mg total) by mouth 2 (two) times daily. Take 1 tablet extra as needed for edema 03/15/16   Josue Hector, MD   furosemide (LASIX) 20 MG tablet Take 1 tablet (20 mg total) by mouth 2 (two) times daily. Take 1 tablet (20 mg total) by mouth 2 (two) times daily. Take 1 tablet extra as needed for edema 04/08/16   Briyonna Omara Camprubi-Soms, PA-C  glipiZIDE (GLUCOTROL) 5 MG tablet Take 10 mg by mouth every morning. 12/20/15   Historical Provider, MD  hydroxychloroquine (PLAQUENIL) 200 MG tablet Take 200 mg by mouth 2 (two) times daily.  03/10/15   Historical Provider, MD  metFORMIN (GLUCOPHAGE) 500 MG tablet Take 500 mg by mouth 2 (two) times daily.     Historical Provider, MD  methotrexate (RHEUMATREX) 2.5 MG tablet Take 20 mg by mouth every Monday. Caution:Chemotherapy. Protect from light.    Historical Provider, MD  metoprolol tartrate (LOPRESSOR) 25 MG tablet Take 0.5 tablets (12.5 mg total) by mouth 2 (two) times daily. 10/01/13   Brett Canales, PA-C  NOVOLOG MIX 70/30 FLEXPEN (70-30) 100 UNIT/ML FlexPen Inject 20-40 Units into the skin 2 (two) times daily with a meal. She takes 20 units in the morning and 40 units in the evening. 03/14/15  Historical Provider, MD  omeprazole (PRILOSEC) 20 MG capsule Take 1 capsule (20 mg total) by mouth daily. 01/10/16   Liberty Handy, MD  oxyCODONE-acetaminophen (ROXICET) 5-325 MG tablet Take 1 tablet by mouth every 8 (eight) hours as needed for severe pain. 01/10/16   Liberty Handy, MD  potassium chloride SA (K-DUR,KLOR-CON) 20 MEQ tablet Take 0.5 tablets (10 mEq total) by mouth 2 (two) times daily. 07/24/15   Eileen Stanford, PA-C  predniSONE (DELTASONE) 5 MG tablet Take 10 mg by mouth daily with breakfast.     Historical Provider, MD  triamcinolone cream (KENALOG) 0.1 % Apply 1 application topically 3 (three) times daily as needed. For itching 04/08/16   Mykel Mohl Camprubi-Soms, PA-C   BP 154/99 mmHg  Pulse 83  Temp(Src) 98 F (36.7 C) (Oral)  Resp 20  SpO2 99%  LMP 10/28/2013 Physical Exam  Constitutional: She is oriented to person, place, and time. Vital signs are normal. She  appears well-developed and well-nourished.  Non-toxic appearance. No distress.  Afebrile, nontoxic, NAD  HENT:  Head: Normocephalic and atraumatic.  Mouth/Throat: Mucous membranes are normal.  Eyes: Conjunctivae and EOM are normal. Right eye exhibits no discharge. Left eye exhibits no discharge.  Neck: Normal range of motion. Neck supple.  Cardiovascular: Normal rate and intact distal pulses.   Pulmonary/Chest: Effort normal. No respiratory distress.  Abdominal: Normal appearance. She exhibits no distension.  Musculoskeletal: Normal range of motion.  Neurological: She is alert and oriented to person, place, and time. She has normal strength. No sensory deficit.  Skin: Skin is warm, dry and intact. Rash noted. Rash is maculopapular.  Maculopapular erythematous rash around both ankles, with a few additional lesions extending up the left leg, with a blister noted to the left shin which is not tense or weeping, no warmth or cellulitic changes, no drainage, no induration or fluctuance to the rash. One small additional area of similar maculopapular erythematous rash noted to the right forearm and right lateral chest but no additional blistering noted elsewhere on skin. No interdigital webspace involvement, no maceration of skin. No burrowing.  Psychiatric: She has a normal mood and affect. Her behavior is normal.  Nursing note and vitals reviewed.   ED Course  Procedures  DIAGNOSTIC STUDIES: Oxygen Saturation is 99% on RA, normal by my interpretation.    COORDINATION OF CARE: 8:26 PM Will prescribe hydrocortisone cream. Advised pt to take Benadryl every 4-6 hours. Advised pt to contact her rheumatologist. Advised pt to follow-up with her PCP. Discussed treatment plan with pt at bedside and pt agreed to plan.   MDM   Final diagnoses:  Rash and nonspecific skin eruption  Skin bulla  Drug reaction, initial encounter  Medication refill    55 y.o. female here with rash x1wk, started humira  recently and developed the rash after that. No sick contacts, no drainage or cellulitic appearance. One small bulla noted to L shin, doubt bullous pemphigoid but this is in the differential. Doubt bacterial or yeast infection, doubt scabies. Discussed using triamcinolone cream for itch relief, since OTC cortisone wasn't helping. OTC benadryl for additional relief. Call her rheumatologist tomorrow to inquire about what to do about humira, whether to stop it or continue. She is also requesting a refill of her lasix since she lost her rx, will provide one time refill but discussed that her PCP needs to be in charge of her chronic med management. I explained the diagnosis and have given explicit precautions to return to the ER including  for any other new or worsening symptoms. The patient understands and accepts the medical plan as it's been dictated and I have answered their questions. Discharge instructions concerning home care and prescriptions have been given. The patient is STABLE and is discharged to home in good condition.   I personally performed the services described in this documentation, which was scribed in my presence. The recorded information has been reviewed and is accurate.  BP 154/99 mmHg  Pulse 83  Temp(Src) 98 F (36.7 C) (Oral)  Resp 20  SpO2 99%  LMP 10/28/2013  Meds ordered this encounter  Medications  . triamcinolone cream (KENALOG) 0.1 %    Sig: Apply 1 application topically 3 (three) times daily as needed. For itching    Dispense:  30 g    Refill:  0    Order Specific Question:  Supervising Provider    Answer:  MILLER, BRIAN [[id number]]  . furosemide (LASIX) 20 MG tablet    Sig: Take 1 tablet (20 mg total) by mouth 2 (two) times daily. Take 1 tablet (20 mg total) by mouth 2 (two) times daily. Take 1 tablet extra as needed for edema    Dispense:  30 tablet    Refill:  0    Order Specific Question:  Supervising Provider    Answer:  Noemi Chapel [[id number]]      Annessa Satre  Camprubi-Soms, PA-C 04/08/16 2100  Innocence Schlotzhauer Camprubi-Soms, PA-C 04/08/16 2101  Carmin Muskrat, MD 04/08/16 2336

## 2016-04-08 NOTE — ED Notes (Signed)
PA at bedside.

## 2016-04-17 ENCOUNTER — Other Ambulatory Visit: Payer: Self-pay | Admitting: Physician Assistant

## 2016-12-15 DIAGNOSIS — M65831 Other synovitis and tenosynovitis, right forearm: Secondary | ICD-10-CM | POA: Insufficient documentation

## 2016-12-15 DIAGNOSIS — G5601 Carpal tunnel syndrome, right upper limb: Secondary | ICD-10-CM

## 2016-12-15 HISTORY — DX: Carpal tunnel syndrome, right upper limb: G56.01

## 2017-02-10 DIAGNOSIS — G4733 Obstructive sleep apnea (adult) (pediatric): Secondary | ICD-10-CM

## 2017-02-10 HISTORY — DX: Obstructive sleep apnea (adult) (pediatric): G47.33

## 2017-02-22 ENCOUNTER — Encounter (HOSPITAL_COMMUNITY): Payer: Self-pay | Admitting: Emergency Medicine

## 2017-02-22 ENCOUNTER — Emergency Department (HOSPITAL_COMMUNITY)
Admission: EM | Admit: 2017-02-22 | Discharge: 2017-02-22 | Disposition: A | Discharge status: Home / Self Care | Payer: Medicaid Other | Attending: Emergency Medicine | Admitting: Emergency Medicine

## 2017-02-22 ENCOUNTER — Ambulatory Visit (INDEPENDENT_AMBULATORY_CARE_PROVIDER_SITE_OTHER): Payer: Medicaid Other | Admitting: Orthopaedic Surgery

## 2017-02-22 DIAGNOSIS — Z09 Encounter for follow-up examination after completed treatment for conditions other than malignant neoplasm: Secondary | ICD-10-CM

## 2017-02-22 DIAGNOSIS — E119 Type 2 diabetes mellitus without complications: Secondary | ICD-10-CM | POA: Insufficient documentation

## 2017-02-22 DIAGNOSIS — I5033 Acute on chronic diastolic (congestive) heart failure: Secondary | ICD-10-CM | POA: Insufficient documentation

## 2017-02-22 DIAGNOSIS — I110 Hypertensive heart disease with heart failure: Secondary | ICD-10-CM | POA: Insufficient documentation

## 2017-02-22 DIAGNOSIS — Z7901 Long term (current) use of anticoagulants: Secondary | ICD-10-CM | POA: Diagnosis not present

## 2017-02-22 DIAGNOSIS — Z7984 Long term (current) use of oral hypoglycemic drugs: Secondary | ICD-10-CM | POA: Diagnosis not present

## 2017-02-22 DIAGNOSIS — Z4801 Encounter for change or removal of surgical wound dressing: Secondary | ICD-10-CM | POA: Diagnosis present

## 2017-02-22 DIAGNOSIS — Z87891 Personal history of nicotine dependence: Secondary | ICD-10-CM | POA: Diagnosis not present

## 2017-02-22 NOTE — ED Provider Notes (Signed)
[facility] DEPT Provider Note   CSN: [id number] Arrival date & time: 02/22/17  1831    History   Chief Complaint Chief Complaint  Patient presents with  . Arm Pain    HPI Kathleen Kane is a 56 y.o. female.  56 year old female with hx of atrial fibrillation, CHF, diabetes, hypertension, rheumatoid arthritis presents to the emergency department for evaluation of her right wrist post procedure. Patient is 5 days s/p R wrist tenosynovectomy with carpal tunnel release by Dr. Nicoletta Dress at [facility]. She states that she was instructed to take her splint off after 1 week, but did not feel come trouble doing this at home. She has had episodes of paresthesias in her fingers which is intermittent and waxing and waning. She has had some improvement in this with ibuprofen as well as her prescribed Norco. She has also noted some swelling to her hand. She denies numbness or purulent drainage. No fevers. She has outpatient follow up scheduled with Dr. Nicoletta Dress on 03/03/17.   The history is provided by the patient and a relative. No language interpreter was used.  Arm Pain     Past Medical History:  Diagnosis Date  . Anxiety   . Atrial fibrillation, persistent ([place])   . CHF (congestive heart failure) ([place]) 2013   No echo found from that time, most likely diastolic  . Depression   . Diabetes mellitus without complication ([place])   . Hypertension   . Obesity   . Obstructive sleep apnea    Sleep study performed 10/18/2008. AHI-6.8/hr, during REM-27.3/hr. RDI-25.0/hr, during REM-40.9/hr. avg o2 sat during REM and NREM 95%  . Rheumatoid arthritis [facility])     Patient Active Problem List   Diagnosis Date Noted  . Acute on chronic diastolic heart failure ([place]) 08/08/2015  . Morbid obesity ([place]) 05/06/2015  . Essential hypertension 04/10/2015  . Numbness and tingling in left hand 10/01/2013  . Tobacco abuse 10/01/2013  . Elevated CA-125 02/06/2013  . Pelvic mass in female 01/29/2013  . Diabetes mellitus ([place])  08/10/2012  . Atrial fibrillation ([place])   . Chronic diastolic CHF (congestive heart failure) ([place])     Past Surgical History:  Procedure Laterality Date  . CARDIOVASCULAR STRESS TEST  02/16/2013   no significant EKG changes with Lexiscan, normal LV function and normal wall function  . CESAREAN SECTION     x2  . DOPPLER ECHOCARDIOGRAPHY  01/24/2008   EF 55-60%, no diagnostic evidence of LV wall motion abnormalities, LV wall thickness was mild-moderately increased.  Marland Kitchen LAPAROTOMY Right 02/27/2013   Procedure: EXPLORATORY LAPAROTOMY, right salpingo-oopherectomy;  Surgeon: [name], MD;  Location: [facility];  Service: Gynecology;  Laterality: Right;  . left breast cyst    . SALPINGOOPHORECTOMY Right 02/27/2013   Procedure: SALPINGO OOPHORECTOMY;  Surgeon: [name], MD;  Location: [facility];  Service: Gynecology;  Laterality: Right;    OB History    No data available       Home Medications    Prior to Admission medications   Medication Sig Start Date End Date Taking? Authorizing Provider  allopurinol (ZYLOPRIM) 300 MG tablet TAKE 1 TABLET (300 MG TOTAL) BY MOUTH DAILY. 04/10/15   Historical Provider, MD  apixaban (ELIQUIS) 5 MG TABS tablet Take 1 tablet (5 mg total) by mouth 2 (two) times daily. 03/15/16   Josue Hector, MD  calcium-vitamin D (OSCAL WITH D) 500-200 MG-UNIT TABS Take 1 tablet by mouth daily.  03/11/15   Historical Provider, MD  folic acid (  FOLVITE) 1 MG tablet Take 1 mg by mouth every morning.     Historical Provider, MD  furosemide (LASIX) 20 MG tablet Take 1 tablet (20 mg total) by mouth 2 (two) times daily. Take 1 tablet extra as needed for edema 03/15/16   Josue Hector, MD  furosemide (LASIX) 20 MG tablet Take 1 tablet (20 mg total) by mouth 2 (two) times daily. Take 1 tablet (20 mg total) by mouth 2 (two) times daily. Take 1 tablet extra as needed for edema 04/08/16   [employer], PA-C  glipiZIDE (GLUCOTROL) 5 MG tablet Take 10 mg by mouth every morning. 12/20/15    Historical Provider, MD  hydroxychloroquine (PLAQUENIL) 200 MG tablet Take 200 mg by mouth 2 (two) times daily.  03/10/15   Historical Provider, MD  KLOR-CON M20 20 MEQ tablet TAKE 1 TABLET BY MOUTH EVERY DAY 04/20/16   Liliane Shi, PA-C  metFORMIN (GLUCOPHAGE) 500 MG tablet Take 500 mg by mouth 2 (two) times daily.     Historical Provider, MD  methotrexate (RHEUMATREX) 2.5 MG tablet Take 20 mg by mouth every Monday. Caution:Chemotherapy. Protect from light.    Historical Provider, MD  metoprolol tartrate (LOPRESSOR) 25 MG tablet Take 0.5 tablets (12.5 mg total) by mouth 2 (two) times daily. 10/01/13   Brett Canales, PA-C  NOVOLOG MIX 70/30 FLEXPEN (70-30) 100 UNIT/ML FlexPen Inject 20-40 Units into the skin 2 (two) times daily with a meal. She takes 20 units in the morning and 40 units in the evening. 03/14/15   Historical Provider, MD  omeprazole (PRILOSEC) 20 MG capsule Take 1 capsule (20 mg total) by mouth daily. 01/10/16   Liberty Handy, MD  oxyCODONE-acetaminophen (ROXICET) 5-325 MG tablet Take 1 tablet by mouth every 8 (eight) hours as needed for severe pain. 01/10/16   Liberty Handy, MD  potassium chloride SA (K-DUR,KLOR-CON) 20 MEQ tablet Take 0.5 tablets (10 mEq total) by mouth 2 (two) times daily. 07/24/15   Eileen Stanford, PA-C  predniSONE (DELTASONE) 5 MG tablet Take 10 mg by mouth daily with breakfast.     Historical Provider, MD  triamcinolone cream (KENALOG) 0.1 % Apply 1 application topically 3 (three) times daily as needed. For itching 04/08/16   Mercedes Street, PA-C    Family History Family History  Problem Relation Age of Onset  . Diabetes Other   . Hypertension Mother   . Diabetes Mother   . Hypertension Father   . Diabetes Paternal Grandmother   . Diabetes Sister   . Heart Problems Sister   . Kidney failure Sister   . Hypertension Brother   . Diabetes Brother     Social History Social History  Substance Use Topics  . Smoking status: Former Smoker    Packs/day: 0.75     Years: 24.00    Types: Cigarettes    Quit date: 06/08/2014  . Smokeless tobacco: Never Used     Comment: smoking cessation info given.   . Alcohol use No     Allergies   Ace inhibitors and Lisinopril   Review of Systems Review of Systems Ten systems reviewed and are negative for acute change, except as noted in the HPI.    Physical Exam Updated Vital Signs BP (!) 150/111   Pulse 86   Temp 98.6 F (37 C) (Oral)   Resp 18   LMP 10/28/2013 Comment: perimenopausal  SpO2 98%   Physical Exam  Constitutional: She is oriented to person, place, and time. She appears  well-developed and well-nourished. No distress.  Obese, nontoxic, and pleasant  HENT:  Head: Normocephalic and atraumatic.  Eyes: Conjunctivae and EOM are normal. No scleral icterus.  Neck: Normal range of motion.  Cardiovascular: Normal rate, regular rhythm and intact distal pulses.   Distal radial pulse 2+ in the RUE. Capillary refill brisk in all digits of the R hand.  Pulmonary/Chest: Effort normal. No respiratory distress.  Respirations even and unlabored  Musculoskeletal: Normal range of motion.  There is edema to the R hand c/w recent surgery and instrumentation. No erythema or heat to touch. No fluctuance or purulent drainage.  Neurological: She is alert and oriented to person, place, and time. She exhibits normal muscle tone. Coordination normal.  Sensation to light touch intact in the R hand and digits. Patient able to wiggle all fingers.  Skin: Skin is warm and dry. No rash noted. She is not diaphoretic. No erythema. No pallor.  Surgical sites are C/D/I; see images below.  Psychiatric: She has a normal mood and affect. Her behavior is normal.  Nursing note and vitals reviewed.    ED Treatments / Results  Labs (all labs ordered are listed, but only abnormal results are displayed) Labs Reviewed - No data to display  EKG  EKG Interpretation None       Radiology No results  found.  Procedures Procedures (including critical care time)  Medications Ordered in ED Medications - No data to display         Initial Impression / Assessment and Plan / ED Course  I have reviewed the triage vital signs and the nursing notes.  Pertinent labs & imaging results that were available during my care of the patient were reviewed by me and considered in my medical decision making (see chart for details).    9:50 PM Patient's dressing taken down. There is mild swelling which seems appropriate given recent surgery and instrumentation. Patient is neurovascularly intact. No signs of abscess formation or cellulitis. Will discuss plan of care and potential need for splinting with [facility].  10:10 PM Case discussed with Dr. Freda Munro of [facility]. She reports that the splint was placed as a precaution given the large amount of synovitis. She believes a soft dressing is appropriate from this point forward. She has instructed that the patient not lift anything heavier than the weight of the cell phone. The patient is to follow-up in the office at her scheduled appointment.  10:35 PM Wounds dressed by RN without complications. Patient verbally given instructions for continued outpatient management. Return precautions discussed and provided. Patient discharged in stable condition with no unaddressed concerns.   Final Clinical Impressions(s) / ED Diagnoses   Final diagnoses:  Postop check    New Prescriptions New Prescriptions   No medications on file     Antonietta Breach, PA-C 02/22/17 [place], PA-C 02/22/17 [place], MD 02/23/17 [phone]

## 2017-02-22 NOTE — ED Triage Notes (Signed)
Pt recently had surgery to right arm; pt sts some tingling to hand in cast; some swelling noted; fingers pink with good cap refill

## 2017-02-22 NOTE — ED Notes (Signed)
Humes PA cut splint off.  Incision to top of right wrist, palm and anterior wrist, sutures intact.  Swelling noted to right hand.

## 2017-02-22 NOTE — Discharge Instructions (Signed)
Change your dressing next on 02/24/17. You should clean your wound and change your dressing at least once per day. Take 400mg  ibuprofen every 6 hours for pain and inflammation. You may also take the Hydrocodone prescribed to your for pain. Ice the area to limit swelling. Avoid heavy lifting. Do not lift items heavier than the weight of a cell phone. Follow up with Dr. Nicoletta Dress at your scheduled appointment in 9 days. You may return, as needed, for new or concerning symptoms.

## 2017-03-07 ENCOUNTER — Encounter (HOSPITAL_COMMUNITY): Payer: Self-pay | Admitting: *Deleted

## 2017-03-07 ENCOUNTER — Emergency Department (HOSPITAL_COMMUNITY): Payer: Medicaid Other

## 2017-03-07 ENCOUNTER — Emergency Department (HOSPITAL_COMMUNITY)
Admission: EM | Admit: 2017-03-07 | Discharge: 2017-03-07 | Disposition: A | Discharge status: Home / Self Care | Payer: Medicaid Other | Attending: Emergency Medicine | Admitting: Emergency Medicine

## 2017-03-07 DIAGNOSIS — I110 Hypertensive heart disease with heart failure: Secondary | ICD-10-CM | POA: Diagnosis not present

## 2017-03-07 DIAGNOSIS — Z7901 Long term (current) use of anticoagulants: Secondary | ICD-10-CM | POA: Insufficient documentation

## 2017-03-07 DIAGNOSIS — Z87891 Personal history of nicotine dependence: Secondary | ICD-10-CM | POA: Diagnosis not present

## 2017-03-07 DIAGNOSIS — E119 Type 2 diabetes mellitus without complications: Secondary | ICD-10-CM | POA: Diagnosis not present

## 2017-03-07 DIAGNOSIS — R0602 Shortness of breath: Secondary | ICD-10-CM | POA: Insufficient documentation

## 2017-03-07 DIAGNOSIS — R600 Localized edema: Secondary | ICD-10-CM | POA: Insufficient documentation

## 2017-03-07 DIAGNOSIS — R0789 Other chest pain: Secondary | ICD-10-CM | POA: Diagnosis not present

## 2017-03-07 DIAGNOSIS — I5033 Acute on chronic diastolic (congestive) heart failure: Secondary | ICD-10-CM | POA: Diagnosis not present

## 2017-03-07 DIAGNOSIS — Z7984 Long term (current) use of oral hypoglycemic drugs: Secondary | ICD-10-CM | POA: Diagnosis not present

## 2017-03-07 DIAGNOSIS — R079 Chest pain, unspecified: Secondary | ICD-10-CM

## 2017-03-07 LAB — BASIC METABOLIC PANEL
Anion gap: 13 (ref 5–15)
BUN: 9 mg/dL (ref 6–20)
CHLORIDE: 100 mmol/L — AB (ref 101–111)
CO2: 22 mmol/L (ref 22–32)
Calcium: 9.1 mg/dL (ref 8.9–10.3)
Creatinine, Ser: 0.65 mg/dL (ref 0.44–1.00)
GFR calc Af Amer: >60 mL/min (ref >60)
GFR calc non Af Amer: >60 mL/min (ref >60)
Glucose, Bld: 247 mg/dL — ABNORMAL HIGH (ref 65–99)
POTASSIUM: 3.7 mmol/L (ref 3.5–5.1)
SODIUM: 135 mmol/L (ref 135–145)

## 2017-03-07 LAB — URINALYSIS, ROUTINE W REFLEX MICROSCOPIC
BACTERIA UA: NONE SEEN
BILIRUBIN URINE: NEGATIVE
Glucose, UA: 150 mg/dL — AB
Hgb urine dipstick: NEGATIVE
KETONES UR: NEGATIVE mg/dL
NITRITE: NEGATIVE
PROTEIN: NEGATIVE mg/dL
Specific Gravity, Urine: 1.003 — ABNORMAL LOW (ref 1.005–1.030)
pH: 6 (ref 5.0–8.0)

## 2017-03-07 LAB — CBC
HEMATOCRIT: 32.4 % — AB (ref 36.0–46.0)
Hemoglobin: 10.8 g/dL — ABNORMAL LOW (ref 12.0–15.0)
MCH: 28.2 pg (ref 26.0–34.0)
MCHC: 33.3 g/dL (ref 30.0–36.0)
MCV: 84.6 fL (ref 78.0–100.0)
Platelets: 244 10*3/uL (ref 150–400)
RBC: 3.83 MIL/uL — ABNORMAL LOW (ref 3.87–5.11)
RDW: 13.5 % (ref 11.5–15.5)
WBC: 6.1 10*3/uL (ref 4.0–10.5)

## 2017-03-07 LAB — TROPONIN I: Troponin I: <0.03 ng/mL (ref <0.03)

## 2017-03-07 LAB — BRAIN NATRIURETIC PEPTIDE: B Natriuretic Peptide: 273 pg/mL — ABNORMAL HIGH (ref 0.0–100.0)

## 2017-03-07 NOTE — ED Provider Notes (Signed)
[facility] DEPT Provider Note   CSN: [id number] Arrival date & time: 03/07/17  1329     History   Chief Complaint Chief Complaint  Patient presents with  . Chest Pain  . Shortness of Breath    HPI Kathleen Kane is a 56 y.o. female.  The history is provided by the patient and medical records.  Chest Pain   Associated symptoms include cough and shortness of breath.  Shortness of Breath  Associated symptoms include cough and chest pain.    56 y.o. F with hx of anxiety, AFIB, CHF, depression, DM, obesity, OSA, RA, presenting to the ED for chest pain.  Patient reports this is been going on since last Wednesday, about 5 days now. States pain has been fairly constant. Mostly localized to her central chest, described as a "pressure". She does report associated shortness of breath for the same time. Shortness of breath is worse with lying flat or exertional activities. States today she was trying to change her clothes out of her pajamas and she got so winded she had to lay back down. Does report a slight cough that is nonproductive, states mostly she just continues clearing her throat over and over. She's not had any fever or chills. No sick contacts.  She does have history of A. fib, has been compliant with her xarelto.  Has also been taking lasix as prescribed.  Denies any significant leg swelling.   Unsure of weight gain.  She is followed by cardiology, Dr. Johnsie Cancel.  Last 2D echo in 2016 with estimated EF of 50-55%.    Past Medical History:  Diagnosis Date  . Anxiety   . Atrial fibrillation, persistent ([place])   . CHF (congestive heart failure) ([place]) 2013   No echo found from that time, most likely diastolic  . Depression   . Diabetes mellitus without complication ([place])   . Hypertension   . Obesity   . Obstructive sleep apnea    Sleep study performed 10/18/2008. AHI-6.8/hr, during REM-27.3/hr. RDI-25.0/hr, during REM-40.9/hr. avg o2 sat during REM and NREM 95%  . Rheumatoid  arthritis [facility])     Patient Active Problem List   Diagnosis Date Noted  . Acute on chronic diastolic heart failure ([place]) 08/08/2015  . Morbid obesity ([place]) 05/06/2015  . Essential hypertension 04/10/2015  . Numbness and tingling in left hand 10/01/2013  . Tobacco abuse 10/01/2013  . Elevated CA-125 02/06/2013  . Pelvic mass in female 01/29/2013  . Diabetes mellitus ([place]) 08/10/2012  . Atrial fibrillation ([place])   . Chronic diastolic CHF (congestive heart failure) ([place])     Past Surgical History:  Procedure Laterality Date  . CARDIOVASCULAR STRESS TEST  02/16/2013   no significant EKG changes with Lexiscan, normal LV function and normal wall function  . CESAREAN SECTION     x2  . DOPPLER ECHOCARDIOGRAPHY  01/24/2008   EF 55-60%, no diagnostic evidence of LV wall motion abnormalities, LV wall thickness was mild-moderately increased.  Marland Kitchen LAPAROTOMY Right 02/27/2013   Procedure: EXPLORATORY LAPAROTOMY, right salpingo-oopherectomy;  Surgeon: [name], MD;  Location: [facility] ORS;  Service: Gynecology;  Laterality: Right;  . left breast cyst    . SALPINGOOPHORECTOMY Right 02/27/2013   Procedure: SALPINGO OOPHORECTOMY;  Surgeon: [name], MD;  Location: [facility];  Service: Gynecology;  Laterality: Right;    OB History    No data available       Home Medications    Prior to Admission medications   Medication Sig Start Date End  Date Taking? Authorizing Provider  allopurinol (ZYLOPRIM) 300 MG tablet TAKE 1 TABLET (300 MG TOTAL) BY MOUTH DAILY. 04/10/15   Historical Provider, MD  apixaban (ELIQUIS) 5 MG TABS tablet Take 1 tablet (5 mg total) by mouth 2 (two) times daily. 03/15/16   Josue Hector, MD  calcium-vitamin D (OSCAL WITH D) 500-200 MG-UNIT TABS Take 1 tablet by mouth daily.  03/11/15   Historical Provider, MD  folic acid (FOLVITE) 1 MG tablet Take 1 mg by mouth every morning.     Historical Provider, MD  furosemide (LASIX) 20 MG tablet Take 1 tablet (20 mg total) by mouth 2 (two)  times daily. Take 1 tablet extra as needed for edema 03/15/16   Josue Hector, MD  furosemide (LASIX) 20 MG tablet Take 1 tablet (20 mg total) by mouth 2 (two) times daily. Take 1 tablet (20 mg total) by mouth 2 (two) times daily. Take 1 tablet extra as needed for edema 04/08/16   [employer], PA-C  glipiZIDE (GLUCOTROL) 5 MG tablet Take 10 mg by mouth every morning. 12/20/15   Historical Provider, MD  hydroxychloroquine (PLAQUENIL) 200 MG tablet Take 200 mg by mouth 2 (two) times daily.  03/10/15   Historical Provider, MD  KLOR-CON M20 20 MEQ tablet TAKE 1 TABLET BY MOUTH EVERY DAY 04/20/16   Liliane Shi, PA-C  metFORMIN (GLUCOPHAGE) 500 MG tablet Take 500 mg by mouth 2 (two) times daily.     Historical Provider, MD  methotrexate (RHEUMATREX) 2.5 MG tablet Take 20 mg by mouth every Monday. Caution:Chemotherapy. Protect from light.    Historical Provider, MD  metoprolol tartrate (LOPRESSOR) 25 MG tablet Take 0.5 tablets (12.5 mg total) by mouth 2 (two) times daily. 10/01/13   Brett Canales, PA-C  NOVOLOG MIX 70/30 FLEXPEN (70-30) 100 UNIT/ML FlexPen Inject 20-40 Units into the skin 2 (two) times daily with a meal. She takes 20 units in the morning and 40 units in the evening. 03/14/15   Historical Provider, MD  omeprazole (PRILOSEC) 20 MG capsule Take 1 capsule (20 mg total) by mouth daily. 01/10/16   Liberty Handy, MD  oxyCODONE-acetaminophen (ROXICET) 5-325 MG tablet Take 1 tablet by mouth every 8 (eight) hours as needed for severe pain. 01/10/16   Liberty Handy, MD  potassium chloride SA (K-DUR,KLOR-CON) 20 MEQ tablet Take 0.5 tablets (10 mEq total) by mouth 2 (two) times daily. 07/24/15   Eileen Stanford, PA-C  predniSONE (DELTASONE) 5 MG tablet Take 10 mg by mouth daily with breakfast.     Historical Provider, MD  triamcinolone cream (KENALOG) 0.1 % Apply 1 application topically 3 (three) times daily as needed. For itching 04/08/16   Mercedes Street, PA-C    Family History Family History  Problem  Relation Age of Onset  . Hypertension Mother   . Diabetes Mother   . Hypertension Father   . Diabetes Other   . Diabetes Paternal Grandmother   . Diabetes Sister   . Heart Problems Sister   . Kidney failure Sister   . Hypertension Brother   . Diabetes Brother     Social History Social History  Substance Use Topics  . Smoking status: Former Smoker    Packs/day: 0.75    Years: 24.00    Types: Cigarettes    Quit date: 06/08/2014  . Smokeless tobacco: Never Used     Comment: smoking cessation info given.   . Alcohol use No     Allergies   Ace inhibitors and  Lisinopril   Review of Systems Review of Systems  Respiratory: Positive for cough and shortness of breath.   Cardiovascular: Positive for chest pain.  All other systems reviewed and are negative.    Physical Exam Updated Vital Signs BP (!) 147/84 (BP Location: Left Arm)   Pulse 83   Temp 98.3 F (36.8 C) (Oral)   Resp 14   Ht 5' 2.75" (1.594 m)   Wt 112.7 kg   LMP 10/28/2013 Comment: perimenopausal  SpO2 98%   BMI 44.35 kg/m   Physical Exam  Constitutional: She is oriented to person, place, and time. She appears well-developed and well-nourished.  HENT:  Head: Normocephalic and atraumatic.  Mouth/Throat: Oropharynx is clear and moist.  Eyes: Conjunctivae and EOM are normal. Pupils are equal, round, and reactive to light.  Neck: Normal range of motion.  Cardiovascular: Normal rate, regular rhythm and normal heart sounds.   Pulmonary/Chest: Effort normal and breath sounds normal. No respiratory distress. She has no wheezes.  Abdominal: Soft. Bowel sounds are normal. There is no tenderness. There is no rebound.  Musculoskeletal: Normal range of motion.  Trace edema of the ankles No calf swelling or asymmetry, no tenderness or palpable cords  Neurological: She is alert and oriented to person, place, and time.  Skin: Skin is warm and dry.  Psychiatric: She has a normal mood and affect.  Nursing note and  vitals reviewed.    ED Treatments / Results  Labs (all labs ordered are listed, but only abnormal results are displayed) Labs Reviewed  BASIC METABOLIC PANEL - Abnormal; Notable for the following:       Result Value   Chloride 100 (*)    Glucose, Bld 247 (*)    All other components within normal limits  CBC - Abnormal; Notable for the following:    RBC 3.83 (*)    Hemoglobin 10.8 (*)    HCT 32.4 (*)    All other components within normal limits  BRAIN NATRIURETIC PEPTIDE - Abnormal; Notable for the following:    B Natriuretic Peptide 273.0 (*)    All other components within normal limits  URINALYSIS, ROUTINE W REFLEX MICROSCOPIC - Abnormal; Notable for the following:    Color, Urine STRAW (*)    Specific Gravity, Urine 1.003 (*)    Glucose, UA 150 (*)    Leukocytes, UA TRACE (*)    Squamous Epithelial / LPF 0-5 (*)    All other components within normal limits  TROPONIN I    EKG  EKG Interpretation None       Radiology Dg Chest 2 View  Result Date: 03/07/2017 CLINICAL DATA:  Shortness of breath, chest tightness, cough EXAM: CHEST  2 VIEW COMPARISON:  01/10/2016 FINDINGS: Cardiomegaly. Mild interstitial prominence again noted, similar to prior study. This likely reflects chronic interstitial lung disease. No confluent opacities or effusions. No acute bony abnormality. IMPRESSION: Cardiomegaly. Chronic interstitial prominence, likely chronic interstitial lung disease. Electronically Signed   By: Rolm Baptise M.D.   On: 03/07/2017 14:31    Procedures Procedures (including critical care time)  Medications Ordered in ED Medications - No data to display   Initial Impression / Assessment and Plan / ED Course  I have reviewed the triage vital signs and the nursing notes.  Pertinent labs & imaging results that were available during my care of the patient were reviewed by me and considered in my medical decision making (see chart for details).  55 year old female here with  chest pain or  shortness of breath for 5 days. Has been constant. Shortness of breath worse with exertional activity and lying flat. Does have history of CHF. She is afebrile and nontoxic. Her vital signs are stable on room air. Does have some trace edema of the ankles, no rales noted on exam.  No distress noted.  EKG AFIB which is chronic.  Screening labwork is overall reassuring, troponin negative. BNP is mildly elevated at 273.  CXR without vascular congestion or pulmonary edema.  Patient has ambulated here and maintained O2 sats 96% or greater.  She is anticoagulated with xarelto so I feel PE less likely. Doubt ACS given negative trop here after 5 days of ongoing symptoms.  Symptoms likely due to her CHF, however appear mild at this time and not requiring hospitalization currently.  Will have her double her lasix in the morning for the next 2 days, then resume normal dosing.  Will need close cardiology follow-up.  Discussed plan with patient, she acknowledged understanding and agreed with plan of care.  Return precautions given for new or worsening symptoms.  Case discussed with attending physician, Dr. Johnney Killian, who evaluated patient and agrees with assessment and plan of care.  Final Clinical Impressions(s) / ED Diagnoses   Final diagnoses:  Chest pain, unspecified type  Shortness of breath    New Prescriptions Discharge Medication List as of 03/07/2017  6:59 PM       Larene Pickett, PA-C 03/07/17 2147    Charlesetta Shanks, MD 03/12/17 1020

## 2017-03-07 NOTE — ED Triage Notes (Addendum)
Pt reports onset of non radiating Mid cp onset x 1 wk with SOB, hx of CHF, denies n/v/d, A&O x4, speaks in complete sentences, pt hx of A fib

## 2017-03-07 NOTE — ED Notes (Signed)
Restrictions band (pink) placed on pt's right wrist. Informed Levada Dy - RN.

## 2017-03-07 NOTE — ED Notes (Signed)
Pt's O2 sats maintained 96-99% while ambulating. Pt was dyspneic wile returning to room and sitting down.

## 2017-03-07 NOTE — Discharge Instructions (Signed)
We recommend that you double your lasix in the morning for the next 2 days.  (take 2 tablets in the morning for the next 2 days, continue with one tablet in the evening). Recommend to follow-up with Dr. Johnsie Cancel. Return here for new or worsening symptoms.

## 2017-03-07 NOTE — ED Notes (Signed)
Pt changing into a gown.  Family at bedside.

## 2017-03-07 NOTE — ED Notes (Signed)
Bedside commode placed in pt's room 

## 2017-03-30 ENCOUNTER — Ambulatory Visit (INDEPENDENT_AMBULATORY_CARE_PROVIDER_SITE_OTHER): Payer: Medicaid Other | Admitting: Orthopaedic Surgery

## 2017-04-10 ENCOUNTER — Encounter (HOSPITAL_COMMUNITY): Payer: Self-pay | Admitting: Emergency Medicine

## 2017-04-10 ENCOUNTER — Ambulatory Visit (HOSPITAL_COMMUNITY)
Admission: EM | Admit: 2017-04-10 | Discharge: 2017-04-10 | Disposition: A | Discharge status: Home / Self Care | Payer: Medicaid Other | Attending: Internal Medicine | Admitting: Internal Medicine

## 2017-04-10 DIAGNOSIS — E119 Type 2 diabetes mellitus without complications: Secondary | ICD-10-CM

## 2017-04-10 DIAGNOSIS — Z760 Encounter for issue of repeat prescription: Secondary | ICD-10-CM

## 2017-04-10 MED ORDER — NOVOLOG MIX 70/30 FLEXPEN (70-30) 100 UNIT/ML ~~LOC~~ SUPN: 20.0000 [IU] | PEN_INJECTOR | Freq: Two times a day (BID) | SUBCUTANEOUS | 5 refills | Status: DC

## 2017-04-10 MED ORDER — METFORMIN HCL 500 MG PO TABS: 500.0000 mg | ORAL_TABLET | Freq: Two times a day (BID) | ORAL | 0 refills | Status: DC

## 2017-04-10 NOTE — ED Triage Notes (Addendum)
The patient presented to the [facility] with a complaint of needing her diabetes medications refilled. The patient reported that it has been 2 weeks.

## 2017-04-10 NOTE — ED Provider Notes (Signed)
CSN: [id number]     Arrival date & time 04/10/17  1629 History   None    Chief Complaint  Patient presents with  . Medication Refill   (Consider location/radiation/quality/duration/timing/severity/associated sxs/prior Treatment) Patient c/o needing refills on diabetic medicine.   The history is provided by the patient.  Medication Refill  Medications/supplies requested:  Metformin Insulin Reason for request:  Clinic/provider not available Medications taken before: yes - see home medications   Patient has complete original prescription information: yes     Past Medical History:  Diagnosis Date  . Anxiety   . Atrial fibrillation, persistent ([place])   . CHF (congestive heart failure) ([place]) 2013   No echo found from that time, most likely diastolic  . Depression   . Diabetes mellitus without complication ([place])   . Hypertension   . Obesity   . Obstructive sleep apnea    Sleep study performed 10/18/2008. AHI-6.8/hr, during REM-27.3/hr. RDI-25.0/hr, during REM-40.9/hr. avg o2 sat during REM and NREM 95%  . Rheumatoid arthritis [facility])    Past Surgical History:  Procedure Laterality Date  . CARDIOVASCULAR STRESS TEST  02/16/2013   no significant EKG changes with Lexiscan, normal LV function and normal wall function  . CESAREAN SECTION     x2  . DOPPLER ECHOCARDIOGRAPHY  01/24/2008   EF 55-60%, no diagnostic evidence of LV wall motion abnormalities, LV wall thickness was mild-moderately increased.  Marland Kitchen LAPAROTOMY Right 02/27/2013   Procedure: EXPLORATORY LAPAROTOMY, right salpingo-oopherectomy;  Surgeon: [name], MD;  Location: [facility];  Service: Gynecology;  Laterality: Right;  . left breast cyst    . SALPINGOOPHORECTOMY Right 02/27/2013   Procedure: SALPINGO OOPHORECTOMY;  Surgeon: [name], MD;  Location: [facility];  Service: Gynecology;  Laterality: Right;   Family History  Problem Relation Age of Onset  . Hypertension Mother   . Diabetes Mother   . Hypertension Father    . Diabetes Other   . Diabetes Paternal Grandmother   . Diabetes Sister   . Heart Problems Sister   . Kidney failure Sister   . Hypertension Brother   . Diabetes Brother    Social History  Substance Use Topics  . Smoking status: Former Smoker    Packs/day: 0.75    Years: 24.00    Types: Cigarettes    Quit date: 06/08/2014  . Smokeless tobacco: Never Used     Comment: smoking cessation info given.   . Alcohol use No   OB History    No data available     Review of Systems  Constitutional: Negative.   HENT: Negative.   Eyes: Negative.   Respiratory: Negative.   Cardiovascular: Negative.   Endocrine: Negative.   Genitourinary: Negative.   Musculoskeletal: Negative.   Allergic/Immunologic: Negative.   Neurological: Negative.     Allergies  Ace inhibitors and Lisinopril  Home Medications   Prior to Admission medications   Medication Sig Start Date End Date Taking? Authorizing Provider  allopurinol (ZYLOPRIM) 300 MG tablet TAKE 1 TABLET (300 MG TOTAL) BY MOUTH DAILY. 04/10/15  Yes [provider]  apixaban (ELIQUIS) 5 MG TABS tablet Take 1 tablet (5 mg total) by mouth 2 (two) times daily. 03/15/16  Yes Josue Hector, MD  furosemide (LASIX) 20 MG tablet Take 1 tablet (20 mg total) by mouth 2 (two) times daily. Take 1 tablet extra as needed for edema 03/15/16  Yes Josue Hector, MD  glipiZIDE (GLUCOTROL) 5 MG tablet Take 10 mg by mouth every morning. 12/20/15  Yes [provider]  KLOR-CON M20 20 MEQ tablet TAKE 1 TABLET BY MOUTH EVERY DAY 04/20/16  Yes Richardson Dopp T, PA-C  methotrexate (RHEUMATREX) 2.5 MG tablet Take 20 mg by mouth every Monday. Caution:Chemotherapy. Protect from light.   Yes [provider]  metoprolol tartrate (LOPRESSOR) 25 MG tablet Take 0.5 tablets (12.5 mg total) by mouth 2 (two) times daily. 10/01/13  Yes Brett Canales, PA-C  predniSONE (DELTASONE) 5 MG tablet Take 10 mg by mouth daily with breakfast.    Yes [provider]  metFORMIN (GLUCOPHAGE) 500 MG tablet Take 1 tablet (500 mg total) by mouth 2 (two) times daily. 04/10/17   Lysbeth Penner, FNP  NOVOLOG MIX 70/30 FLEXPEN (70-30) 100 UNIT/ML FlexPen Inject 0.2-0.4 mLs (20-40 Units total) into the skin 2 (two) times daily with a meal. She takes 20 units in the morning and 40 units in the evening. 04/10/17   Lysbeth Penner, FNP   Meds Ordered and Administered this Visit  Medications - No data to display  BP (!) 157/98 (BP Location: Right Arm)   Pulse 62   Temp 97.5 F (36.4 C) (Oral)   Resp 18   LMP 10/28/2013 Comment: perimenopausal  SpO2 98%  No data found.   Physical Exam  Constitutional: She appears well-developed and well-nourished.  HENT:  Head: Normocephalic and atraumatic.  Right Ear: External ear normal.  Left Ear: External ear normal.  Mouth/Throat: Oropharynx is clear and moist.  Eyes: Conjunctivae and EOM are normal. Pupils are equal, round, and reactive to light.  Neck: Normal range of motion. Neck supple.  Cardiovascular: Normal rate, regular rhythm and normal heart sounds.   Pulmonary/Chest: Effort normal and breath sounds normal.  Abdominal: Soft. Bowel sounds are normal.  Nursing note and vitals reviewed.   Urgent Care Course     Procedures (including critical care time)  Labs Review Labs Reviewed - No data to display  Imaging Review No results found.   Visual Acuity Review  Right Eye Distance:   Left Eye Distance:   Bilateral Distance:    Right Eye Near:   Left Eye Near:    Bilateral Near:         MDM   1. Medication refill    Refill metformin Refill Novolog flex pen      Lysbeth Penner, [place] 04/10/17 2125

## 2017-04-10 NOTE — ED Notes (Signed)
Discharged by bill oxford, np

## 2017-04-26 ENCOUNTER — Ambulatory Visit (INDEPENDENT_AMBULATORY_CARE_PROVIDER_SITE_OTHER): Payer: Medicaid Other | Admitting: Orthopaedic Surgery

## 2017-05-24 ENCOUNTER — Ambulatory Visit (INDEPENDENT_AMBULATORY_CARE_PROVIDER_SITE_OTHER): Payer: Medicaid Other | Admitting: Orthopaedic Surgery

## 2017-06-13 ENCOUNTER — Other Ambulatory Visit: Payer: Self-pay | Admitting: Internal Medicine

## 2017-06-13 DIAGNOSIS — R599 Enlarged lymph nodes, unspecified: Secondary | ICD-10-CM

## 2017-06-13 DIAGNOSIS — N6002 Solitary cyst of left breast: Secondary | ICD-10-CM

## 2017-06-13 DIAGNOSIS — E2839 Other primary ovarian failure: Secondary | ICD-10-CM

## 2017-07-20 ENCOUNTER — Other Ambulatory Visit: Payer: Self-pay | Admitting: Internal Medicine

## 2017-07-20 DIAGNOSIS — R599 Enlarged lymph nodes, unspecified: Secondary | ICD-10-CM

## 2017-07-20 DIAGNOSIS — N6002 Solitary cyst of left breast: Secondary | ICD-10-CM

## 2017-08-03 ENCOUNTER — Other Ambulatory Visit: Payer: Self-pay

## 2017-08-25 ENCOUNTER — Ambulatory Visit: Payer: Self-pay | Admitting: Obstetrics & Gynecology

## 2017-08-30 NOTE — Progress Notes (Deleted)
Cardiology Office Note   Date:  08/30/2017   ID:  Kathleen Kane 1961-04-06, MRN [id number]  PCP:  Kathleen Ebbs, MD  Cardiologist:   Kathleen Rouge, MD   No chief complaint on file.     History of Present Illness: Kathleen Kane is a 56 y.o. female who presents for f/u chronic afib, OSA not using CPAP , HTN, DM, RA And obesity. She has been on xarelto for anticoagulation. Seen in ER April 2018 for mild volume overload Rx With lasix. Seen in ER June 2018 after running out of DM supplies She take prednisone, methotrexate and plaquenil for her rheumatoid arthritis. ? Compliance issues with anticoagulation and changed to eliquis   Last echo reviewed 12/24/14 EF 50-55% mild MR moderate LAE estimated PA 33 mmHg No history of CAD.   ***  Past Medical History:  Diagnosis Date  . Anxiety   . Atrial fibrillation, persistent ([place])   . CHF (congestive heart failure) ([place]) 2013   No echo found from that time, most likely diastolic  . Depression   . Diabetes mellitus without complication ([place])   . Hypertension   . Obesity   . Obstructive sleep apnea    Sleep study performed 10/18/2008. AHI-6.8/hr, during REM-27.3/hr. RDI-25.0/hr, during REM-40.9/hr. avg o2 sat during REM and NREM 95%  . Rheumatoid arthritis [facility])     Past Surgical History:  Procedure Laterality Date  . CARDIOVASCULAR STRESS TEST  02/16/2013   no significant EKG changes with Lexiscan, normal LV function and normal wall function  . CESAREAN SECTION     x2  . DOPPLER ECHOCARDIOGRAPHY  01/24/2008   EF 55-60%, no diagnostic evidence of LV wall motion abnormalities, LV wall thickness was mild-moderately increased.  Marland Kitchen LAPAROTOMY Right 02/27/2013   Procedure: EXPLORATORY LAPAROTOMY, right salpingo-oopherectomy;  Surgeon: Kathleen Morning, MD;  Location: [facility];  Service: Gynecology;  Laterality: Right;  . left breast cyst    . SALPINGOOPHORECTOMY Right 02/27/2013   Procedure: SALPINGO OOPHORECTOMY;  Surgeon: Kathleen Morning, MD;  Location: [facility];  Service: Gynecology;  Laterality: Right;     Current Outpatient Prescriptions  Medication Sig Dispense Refill  . allopurinol (ZYLOPRIM) 300 MG tablet TAKE 1 TABLET (300 MG TOTAL) BY MOUTH DAILY.  2  . apixaban (ELIQUIS) 5 MG TABS tablet Take 1 tablet (5 mg total) by mouth 2 (two) times daily. 180 tablet 3  . furosemide (LASIX) 20 MG tablet Take 1 tablet (20 mg total) by mouth 2 (two) times daily. Take 1 tablet extra as needed for edema 180 tablet 3  . glipiZIDE (GLUCOTROL) 5 MG tablet Take 10 mg by mouth every Kane.    Marland Kitchen KLOR-CON M20 20 MEQ tablet TAKE 1 TABLET BY MOUTH EVERY DAY 30 tablet 11  . metFORMIN (GLUCOPHAGE) 500 MG tablet Take 1 tablet (500 mg total) by mouth 2 (two) times daily. 60 tablet 0  . methotrexate (RHEUMATREX) 2.5 MG tablet Take 20 mg by mouth every Monday. Caution:Chemotherapy. Protect from light.    . metoprolol tartrate (LOPRESSOR) 25 MG tablet Take 0.5 tablets (12.5 mg total) by mouth 2 (two) times daily. 180 tablet 3  . NOVOLOG MIX 70/30 FLEXPEN (70-30) 100 UNIT/ML FlexPen Inject 0.2-0.4 mLs (20-40 Units total) into the skin 2 (two) times daily with a meal. She takes 20 units in the Kane and 40 units in the evening. 15 mL 5  . predniSONE (DELTASONE) 5 MG tablet Take 10 mg by mouth daily with breakfast.  No current facility-administered medications for this visit.     Allergies:   Ace inhibitors and Lisinopril    Social History:  The patient  reports that she quit smoking about 3 years ago. Her smoking use included Cigarettes. She has a 18.00 pack-year smoking history. She has never used smokeless tobacco. She reports that she does not drink alcohol or use drugs.   Family History:  The patient's family history includes Diabetes in her brother, mother, other, paternal grandmother, and sister; Heart Problems in her sister; Hypertension in her brother, father, and mother; Kidney failure in her sister.    ROS:  Please see the  history of present illness.   Otherwise, review of systems are positive for {NONE DEFAULTED:18576::"none"}.   All other systems are reviewed and negative.    PHYSICAL EXAM: VS:  LMP 10/28/2013 Comment: perimenopausal , BMI There is no height or weight on file to calculate BMI. Affect appropriate Healthy:  appears stated age 69: normal Neck supple with no adenopathy JVP normal no bruits no thyromegaly Lungs clear with no wheezing and good diaphragmatic motion Heart:  S1/S2 no murmur, no rub, gallop or click PMI normal Abdomen: benighn, BS positve, no tenderness, no AAA no bruit.  No HSM or HJR Distal pulses intact with no bruits No edema Neuro non-focal Skin warm and dry No muscular weakness    EKG: 03/08/17 afib rate 88 PVC nonspecific ST/T wave changes    Recent Labs: 03/07/2017: B Natriuretic Peptide 273.0; BUN 9; Creatinine, Ser 0.65; Hemoglobin 10.8; Platelets 244; Potassium 3.7; Sodium 135    Lipid Panel    Component Value Date/Time   CHOL  01/24/2008 0330    155        ATP III CLASSIFICATION:  <200     mg/dL   Desirable  200-239  mg/dL   Borderline High  >=240    mg/dL   High   TRIG 141 01/24/2008 0330   HDL 30 (L) 01/24/2008 0330   CHOLHDL 5.2 01/24/2008 0330   VLDL 28 01/24/2008 0330   LDLCALC  01/24/2008 0330    97        Total Cholesterol/HDL:CHD Risk Coronary Heart Disease Risk Table                     Men   Women  1/2 Average Risk   3.4   3.3      Wt Readings from Last 3 Encounters:  03/07/17 248 lb 6.4 oz (112.7 kg)  09/17/15 246 lb 6.4 oz (111.8 kg)  09/03/15 254 lb (115.2 kg)      Other studies Reviewed: Additional studies/ records that were reviewed today include: Notes ER April and June Echo 2016 And cardiology notes 2017.    ASSESSMENT AND PLAN:  1.  Chronic Afib 2. Anticoagulation 3. DM 4. Rheumatoid Arhtitis 5. CHF   Current medicines are reviewed at length with the patient today.  The patient does not have concerns  regarding medicines.  The following changes have been made:  ***  Labs/ tests ordered today include: *** No orders of the defined types were placed in this encounter.    Disposition:   FU with cardiology in a year      Signed, Kathleen Rouge, MD  08/30/2017 1:33 PM    [facility] [place], [place], [place]  [address] Phone: [phone]; Fax: [phone]

## 2017-09-02 ENCOUNTER — Ambulatory Visit: Payer: Medicaid Other | Admitting: Cardiovascular Disease

## 2017-09-21 ENCOUNTER — Encounter: Payer: Self-pay | Admitting: Cardiovascular Disease

## 2017-10-06 NOTE — Progress Notes (Signed)
Patient ID: Kathleen Kane, female   DOB: August 28, 1961, 56 y.o.   MRN: [id number]    Date:  10/10/2017   ID:  Kathleen Kane, DOB Jan 14, 1961, MRN [id number]   PCP:  [name], MD  Primary Cardiologist:  Johnsie Cancel   Chief complaint: Heart failure follow-up   History of Present Illness: Kathleen Kane is a 56 y.o. female with a hx of chronic A. fib, diastolic HF, OSA (does not use CPAP), HTN, DM, RA, obesity and previous tobacco abuse who presented on September 15 for evaluation of dyspnea, LE edema and orthopnea.  She has had poor adherence to medications in the past. . Patient was seen in this office by Rosaria Ferries, PA-C in 12/2014. She was volume overloaded and her Lasix was adjusted. She was also placed on Xarelto as she had Medicaid and ability to afford NOACs. Echocardiogram demonstrated normal LV function, moderate LAE, mild MR, PASP 33 mmHg. She is on prednisone which she is trying to taper off of, methotrexate and plaquenil for rheumatoid arthritis. She has chronic atrial fibrillation but had discontinued Xarelto several months ago because of information stating that it may cause death. She was seen by Kathleen Dopp PA-C on 04/10/15 and reported that she woke up that morning with visual disturbance in the right eye. She described a pink and yellow jagged circle. CT of the head was performed that day, which was unremarkable. She has since been started on Eliquis. She was later seen by neurology who felt her sx were more consistent with visual migraine aura.   In ER 09/03/15  with Rheumatoid flare.  Below 15 mg prednisone she does poorly.  Also on methotrexate  BNP 03/07/17  only 273  She is no longer in a wheel chair  rheumatoid and gout. Trying to get off prednisone on Humera, Plaquenil and Methotrexate Sees Dr Reinaldo Meeker Readings from Last 3 Encounters:  10/10/17 254 lb 4 oz (115.3 kg)  03/07/17 248 lb 6.4 oz (112.7 kg)  09/17/15 246 lb 6.4 oz (111.8 kg)     Past Medical  History:  Diagnosis Date  . Anxiety   . Atrial fibrillation, persistent ([place])   . CHF (congestive heart failure) ([place]) 2013   No echo found from that time, most likely diastolic  . Depression   . Diabetes mellitus without complication ([place])   . Hypertension   . Obesity   . Obstructive sleep apnea    Sleep study performed 10/18/2008. AHI-6.8/hr, during REM-27.3/hr. RDI-25.0/hr, during REM-40.9/hr. avg o2 sat during REM and NREM 95%  . Rheumatoid arthritis ([place])     Current Outpatient Medications  Medication Sig Dispense Refill  . allopurinol (ZYLOPRIM) 300 MG tablet TAKE 1 TABLET (300 MG TOTAL) BY MOUTH DAILY.  2  . apixaban (ELIQUIS) 5 MG TABS tablet Take 1 tablet (5 mg total) by mouth 2 (two) times daily. 025 tablet 3  . folic acid (FOLVITE) 1 MG tablet Take 1 mg by mouth daily.  3  . furosemide (LASIX) 20 MG tablet Take 1 tablet (20 mg total) by mouth 2 (two) times daily. Take 1 tablet extra as needed for edema 180 tablet 3  . glipiZIDE (GLUCOTROL) 5 MG tablet Take 10 mg by mouth every morning.    Marland Kitchen HYDROcodone-acetaminophen (NORCO/VICODIN) 5-325 MG tablet Take 1 tablet by mouth 2 (two) times daily.    Marland Kitchen ibuprofen (ADVIL,MOTRIN) 800 MG tablet Take 1 tablet by mouth as needed.  0  . KLOR-CON M20 20 MEQ tablet  TAKE 1 TABLET BY MOUTH EVERY DAY 30 tablet 11  . losartan (COZAAR) 25 MG tablet Take 25 mg by mouth daily.  5  . metFORMIN (GLUCOPHAGE) 500 MG tablet Take 1 tablet (500 mg total) by mouth 2 (two) times daily. 60 tablet 0  . methotrexate (RHEUMATREX) 2.5 MG tablet Take 20 mg by mouth every Monday. Caution:Chemotherapy. Protect from light.    . metoprolol tartrate (LOPRESSOR) 25 MG tablet Take 0.5 tablets (12.5 mg total) by mouth 2 (two) times daily. 180 tablet 3  . NOVOLOG MIX 70/30 FLEXPEN (70-30) 100 UNIT/ML FlexPen Inject 0.2-0.4 mLs (20-40 Units total) into the skin 2 (two) times daily with a meal. She takes 20 units in the morning and 40 units in the evening. 15 mL 5  .  predniSONE (DELTASONE) 5 MG tablet Take 10 mg by mouth daily with breakfast.     . Vitamin D, Ergocalciferol, (DRISDOL) 50000 units CAPS capsule Take 1 capsule by mouth once a week.  0   No current facility-administered medications for this visit.     Allergies:    Allergies  Allergen Reactions  . Ace Inhibitors Cough  . Lisinopril Cough    Social History:  The patient  reports that she quit smoking about 3 years ago. Her smoking use included cigarettes. She has a 18.00 pack-year smoking history. she has never used smokeless tobacco. She reports that she does not drink alcohol or use drugs.   Family history:   Family History  Problem Relation Age of Onset  . Hypertension Mother   . Diabetes Mother   . Hypertension Father   . Diabetes Other   . Diabetes Paternal Grandmother   . Diabetes Sister   . Heart Problems Sister   . Kidney failure Sister   . Hypertension Brother   . Diabetes Brother     ROS:  Please see the history of present illness.  All other systems reviewed and negative.   PHYSICAL EXAM: VS:  BP 132/80   Pulse 94   Ht 5\' 2"  (1.575 m)   Wt 254 lb 4 oz (115.3 kg)   LMP 10/28/2013 Comment: perimenopausal  SpO2 98%   BMI 46.50 kg/m  Affect appropriate Cushingoid Female  HEENT: normal Neck supple with no adenopathy JVP normal no bruits no thyromegaly Lungs clear with no wheezing and good diaphragmatic motion Heart:  S1/S2 no murmur, no rub, gallop or click PMI normal Abdomen: benighn, BS positve, no tenderness, no AAA no bruit.  No HSM or HJR Distal pulses intact with no bruits Plus one bilateral  edema Neuro non-focal Skin warm and dry No muscular weakness   ECG:  01/16/16  afib rate 81 nonspecific ST changes   ASSESSMENT AND PLAN:  Problem List Items Addressed This Visit    Chronic diastolic CHF (congestive heart failure) (HCC) - Primary   Relevant Medications   losartan (COZAAR) 25 MG tablet   Other Relevant Orders   ECHOCARDIOGRAM COMPLETE     Essential hypertension   Relevant Medications   losartan (COZAAR) 25 MG tablet   Other Relevant Orders   ECHOCARDIOGRAM COMPLETE    Other Visit Diagnoses    Nonischemic cardiomyopathy (HCC)       Relevant Medications   losartan (COZAAR) 25 MG tablet   Other Relevant Orders   ECHOCARDIOGRAM COMPLETE     Acute on chronic diastolic CHF:  Stable told to take extra lasix when prednisone dose goes up F/U Dr Eliberto Ivory for rheumatoidDiscussed better diet and fluid restriction.  She has been more compliant with her lasix Will update Echo to make sure EF has not changed   Chronic atrial fibrillation:  Continue rate control. Continue Eliquis She has only been taking it one/day and discussed need for bid dosing   Essential hypertension:  Controlled on lopressor 12.5 mg BID   F/U with me in a year    [employer]

## 2017-10-10 ENCOUNTER — Ambulatory Visit: Payer: Medicaid Other | Admitting: Cardiovascular Disease

## 2017-10-10 ENCOUNTER — Encounter: Payer: Self-pay | Admitting: Cardiovascular Disease

## 2017-10-10 VITALS — BP 132/80 | HR 94 | Ht 62.0 in | Wt 254.2 lb

## 2017-10-10 DIAGNOSIS — I5032 Chronic diastolic (congestive) heart failure: Secondary | ICD-10-CM

## 2017-10-10 DIAGNOSIS — I428 Other cardiomyopathies: Secondary | ICD-10-CM | POA: Diagnosis not present

## 2017-10-10 DIAGNOSIS — I10 Essential (primary) hypertension: Secondary | ICD-10-CM

## 2017-10-10 NOTE — Patient Instructions (Addendum)

## 2017-10-20 ENCOUNTER — Other Ambulatory Visit (HOSPITAL_COMMUNITY): Payer: Medicaid Other

## 2017-11-03 ENCOUNTER — Other Ambulatory Visit: Payer: Self-pay

## 2017-11-03 ENCOUNTER — Ambulatory Visit (HOSPITAL_COMMUNITY): Payer: Medicaid Other | Attending: Cardiovascular Disease

## 2017-11-03 DIAGNOSIS — I5032 Chronic diastolic (congestive) heart failure: Secondary | ICD-10-CM | POA: Diagnosis present

## 2017-11-03 DIAGNOSIS — I10 Essential (primary) hypertension: Secondary | ICD-10-CM | POA: Diagnosis present

## 2017-11-03 DIAGNOSIS — I420 Dilated cardiomyopathy: Secondary | ICD-10-CM | POA: Diagnosis not present

## 2017-11-03 DIAGNOSIS — I428 Other cardiomyopathies: Secondary | ICD-10-CM

## 2018-04-14 ENCOUNTER — Emergency Department (HOSPITAL_COMMUNITY): Payer: Medicaid Other

## 2018-04-14 ENCOUNTER — Emergency Department (HOSPITAL_COMMUNITY)
Admission: EM | Admit: 2018-04-14 | Discharge: 2018-04-14 | Disposition: A | Discharge status: Home / Self Care | Payer: Medicaid Other | Attending: Emergency Medicine | Admitting: Emergency Medicine

## 2018-04-14 ENCOUNTER — Encounter (HOSPITAL_COMMUNITY): Payer: Self-pay | Admitting: *Deleted

## 2018-04-14 ENCOUNTER — Emergency Department (HOSPITAL_BASED_OUTPATIENT_CLINIC_OR_DEPARTMENT_OTHER): Admit: 2018-04-14 | Discharge: 2018-04-14 | Disposition: A | Discharge status: Home / Self Care | Payer: Medicaid Other

## 2018-04-14 ENCOUNTER — Other Ambulatory Visit: Payer: Self-pay

## 2018-04-14 DIAGNOSIS — M79609 Pain in unspecified limb: Secondary | ICD-10-CM | POA: Diagnosis not present

## 2018-04-14 DIAGNOSIS — Z87891 Personal history of nicotine dependence: Secondary | ICD-10-CM | POA: Diagnosis not present

## 2018-04-14 DIAGNOSIS — I509 Heart failure, unspecified: Secondary | ICD-10-CM | POA: Diagnosis not present

## 2018-04-14 DIAGNOSIS — M79604 Pain in right leg: Secondary | ICD-10-CM | POA: Insufficient documentation

## 2018-04-14 DIAGNOSIS — I110 Hypertensive heart disease with heart failure: Secondary | ICD-10-CM | POA: Insufficient documentation

## 2018-04-14 DIAGNOSIS — M7989 Other specified soft tissue disorders: Secondary | ICD-10-CM | POA: Diagnosis not present

## 2018-04-14 DIAGNOSIS — M7121 Synovial cyst of popliteal space [Baker], right knee: Secondary | ICD-10-CM | POA: Diagnosis not present

## 2018-04-14 DIAGNOSIS — E119 Type 2 diabetes mellitus without complications: Secondary | ICD-10-CM | POA: Diagnosis not present

## 2018-04-14 DIAGNOSIS — M06071 Rheumatoid arthritis without rheumatoid factor, right ankle and foot: Secondary | ICD-10-CM | POA: Diagnosis not present

## 2018-04-14 DIAGNOSIS — M069 Rheumatoid arthritis, unspecified: Secondary | ICD-10-CM

## 2018-04-14 DIAGNOSIS — Z79899 Other long term (current) drug therapy: Secondary | ICD-10-CM | POA: Insufficient documentation

## 2018-04-14 DIAGNOSIS — Z7984 Long term (current) use of oral hypoglycemic drugs: Secondary | ICD-10-CM | POA: Insufficient documentation

## 2018-04-14 MED ORDER — DICLOFENAC SODIUM 1 % TD GEL: 2.0000 g | Freq: Four times a day (QID) | TRANSDERMAL | 0 refills | Status: DC

## 2018-04-14 MED ORDER — MELOXICAM 15 MG PO TABS
15.0000 mg | ORAL_TABLET | Freq: Every day | ORAL | 0 refills | Status: DC
Start: 2018-04-14 — End: 2018-10-03

## 2018-04-14 MED ORDER — OXYCODONE-ACETAMINOPHEN 5-325 MG PO TABS
1.0000 | ORAL_TABLET | Freq: Once | ORAL | Status: AC
  Administered 2018-04-14: 1 via ORAL
  Filled 2018-04-14: qty 1

## 2018-04-14 MED ORDER — TRAMADOL HCL 50 MG PO TABS: 50.0000 mg | ORAL_TABLET | Freq: Four times a day (QID) | ORAL | 0 refills | Status: DC | PRN

## 2018-04-14 NOTE — Progress Notes (Signed)
Preliminary results by tech - Venous Duplex Lower Ext. Completed. Negative for deep vein thrombosis. A cystic structure is found in the popliteal fossa. Oda Cogan, BS, RDMS, RVT

## 2018-04-14 NOTE — ED Provider Notes (Signed)
[facility] EMERGENCY DEPARTMENT Provider Note   CSN: [id number] Arrival date & time: 04/14/18  0509     History   Chief Complaint Chief Complaint  Patient presents with  . Leg Pain    HPI Kathleen Kane is a 57 y.o. female.  HPI Kathleen Kane is a 57 y.o. female with history of A. fib, diabetes, rheumatoid arthritis, hypertension, presents to emergency department complaining of right leg pain.  Patient states she has had pain in her right leg for about a week.  She states pain is mainly in the right ankle and behind her right knee.  Pain is worsened with movement and at nighttime.  She states she has not been able to sleep for the last couple nights because of pain.  She denies any injuries.  She states this does not feel like her rheumatoid arthritis pain.  She has been taking ibuprofen which initially helped but now is not relieving her pain.  She has not tried any other medications.  She denies any obvious swelling in the leg.  No bruising.  No history of blood clots.  She states she is supposed to be on blood thinner for A. fib but she ran out.  No recent travel or surgeries.  Past Medical History:  Diagnosis Date  . Anxiety   . Atrial fibrillation, persistent ([place])   . CHF (congestive heart failure) ([place]) 2013   No echo found from that time, most likely diastolic  . Depression   . Diabetes mellitus without complication ([place])   . Hypertension   . Obesity   . Obstructive sleep apnea    Sleep study performed 10/18/2008. AHI-6.8/hr, during REM-27.3/hr. RDI-25.0/hr, during REM-40.9/hr. avg o2 sat during REM and NREM 95%  . Rheumatoid arthritis [facility])     Patient Active Problem List   Diagnosis Date Noted  . Acute on chronic diastolic heart failure ([place]) 08/08/2015  . Morbid obesity ([place]) 05/06/2015  . Essential hypertension 04/10/2015  . Numbness and tingling in left hand 10/01/2013  . Tobacco abuse 10/01/2013  . Elevated CA-125 02/06/2013  . Pelvic  mass in female 01/29/2013  . Diabetes mellitus ([place]) 08/10/2012  . Atrial fibrillation ([place])   . Chronic diastolic CHF (congestive heart failure) ([place])     Past Surgical History:  Procedure Laterality Date  . CARDIOVASCULAR STRESS TEST  02/16/2013   no significant EKG changes with Lexiscan, normal LV function and normal wall function  . CESAREAN SECTION     x2  . DOPPLER ECHOCARDIOGRAPHY  01/24/2008   EF 55-60%, no diagnostic evidence of LV wall motion abnormalities, LV wall thickness was mild-moderately increased.  Marland Kitchen LAPAROTOMY Right 02/27/2013   Procedure: EXPLORATORY LAPAROTOMY, right salpingo-oopherectomy;  Surgeon: [name], MD;  [facility];  Service: Gynecology;  Laterality: Right;  . left breast cyst    . SALPINGOOPHORECTOMY Right 02/27/2013   Procedure: SALPINGO OOPHORECTOMY;  Surgeon: [name], MD;  [facility];  Service: Gynecology;  Laterality: Right;     OB History   None      Home Medications    Prior to Admission medications   Medication Sig Start Date End Date Taking? Authorizing Provider  allopurinol (ZYLOPRIM) 300 MG tablet TAKE 1 TABLET (300 MG TOTAL) BY MOUTH DAILY. 04/10/15   [provider]  apixaban (ELIQUIS) 5 MG TABS tablet Take 1 tablet (5 mg total) by mouth 2 (two) times daily. 03/15/16   Josue Hector, MD  folic acid (FOLVITE) 1 MG  tablet Take 1 mg by mouth daily. 08/19/17   [provider]  furosemide (LASIX) 20 MG tablet Take 1 tablet (20 mg total) by mouth 2 (two) times daily. Take 1 tablet extra as needed for edema 03/15/16   Josue Hector, MD  glipiZIDE (GLUCOTROL) 5 MG tablet Take 10 mg by mouth every morning. 12/20/15   [provider]  HYDROcodone-acetaminophen (NORCO/VICODIN) 5-325 MG tablet Take 1 tablet by mouth 2 (two) times daily.    [provider]  ibuprofen (ADVIL,MOTRIN) 800 MG tablet Take 1 tablet by mouth as needed. 08/23/17   [provider]  KLOR-CON M20 20 MEQ tablet  TAKE 1 TABLET BY MOUTH EVERY DAY 04/20/16   Richardson Dopp T, PA-C  losartan (COZAAR) 25 MG tablet Take 25 mg by mouth daily. 09/15/17   [provider]  metFORMIN (GLUCOPHAGE) 500 MG tablet Take 1 tablet (500 mg total) by mouth 2 (two) times daily. 04/10/17   Lysbeth Penner, FNP  methotrexate (RHEUMATREX) 2.5 MG tablet Take 20 mg by mouth every Monday. Caution:Chemotherapy. Protect from light.    [provider]  metoprolol tartrate (LOPRESSOR) 25 MG tablet Take 0.5 tablets (12.5 mg total) by mouth 2 (two) times daily. 10/01/13   Brett Canales, PA-C  NOVOLOG MIX 70/30 FLEXPEN (70-30) 100 UNIT/ML FlexPen Inject 0.2-0.4 mLs (20-40 Units total) into the skin 2 (two) times daily with a meal. She takes 20 units in the morning and 40 units in the evening. 04/10/17   Lysbeth Penner, FNP  predniSONE (DELTASONE) 5 MG tablet Take 10 mg by mouth daily with breakfast.     [provider]  Vitamin D, Ergocalciferol, (DRISDOL) 50000 units CAPS capsule Take 1 capsule by mouth once a week. 09/09/17   [provider]    Family History Family History  Problem Relation Age of Onset  . Hypertension Mother   . Diabetes Mother   . Hypertension Father   . Diabetes Other   . Diabetes Paternal Grandmother   . Diabetes Sister   . Heart Problems Sister   . Kidney failure Sister   . Hypertension Brother   . Diabetes Brother     Social History Social History   Tobacco Use  . Smoking status: Former Smoker    Packs/day: 0.75    Years: 24.00    Pack years: 18.00    Types: Cigarettes    Last attempt to quit: 06/08/2014    Years since quitting: 3.8  . Smokeless tobacco: Never Used  . Tobacco comment: smoking cessation info given.   Substance Use Topics  . Alcohol use: No    Alcohol/week: 0.0 oz  . Drug use: No     Allergies   Ace inhibitors and Lisinopril   Review of Systems Review of Systems  Constitutional: Negative for chills and fever.  Respiratory: Negative  for cough, chest tightness and shortness of breath.   Cardiovascular: Negative for chest pain, palpitations and leg swelling.  Genitourinary: Negative for dysuria, flank pain and pelvic pain.  Musculoskeletal: Positive for arthralgias and myalgias. Negative for joint swelling, neck pain and neck stiffness.  Skin: Negative for rash.  Neurological: Negative for dizziness, weakness and headaches.  All other systems reviewed and are negative.    Physical Exam Updated Vital Signs BP (!) 158/86 (BP Location: Right Arm)   Pulse 93   Temp 99.1 F (37.3 C) (Oral)   Resp 18   Ht 5' 2.75" (1.594 m)   Wt 113.9 kg (  251 lb)   LMP 10/28/2013 Comment: perimenopausal  SpO2 97%   BMI 44.82 kg/m   Physical Exam  Constitutional: She appears well-developed and well-nourished. No distress.  HENT:  Head: Normocephalic.  Eyes: Conjunctivae are normal.  Neck: Neck supple.  Cardiovascular: Normal rate, regular rhythm and normal heart sounds.  Pulmonary/Chest: Effort normal and breath sounds normal. No respiratory distress. She has no wheezes. She has no rales.  Musculoskeletal: She exhibits no edema.  Normal appearing right leg. ttp diffusely over right ankle, calf, posterior knee. Full rom of knee and ankle joints. DP pulses intact. Positive homans sign  Neurological: She is alert.  Skin: Skin is warm and dry.  Psychiatric: She has a normal mood and affect. Her behavior is normal.  Nursing note and vitals reviewed.    ED Treatments / Results  Labs (all labs ordered are listed, but only abnormal results are displayed) Labs Reviewed - No data to display  EKG None  Radiology No results found.  Procedures Procedures (including critical care time)  Medications Ordered in ED Medications  oxyCODONE-acetaminophen (PERCOCET/ROXICET) 5-325 MG per tablet 1 tablet (1 tablet Oral Given 04/14/18 0174)     Initial Impression / Assessment and Plan / ED Course  I have reviewed the triage vital  signs and the nursing notes.  Pertinent labs & imaging results that were available during my care of the patient were reviewed by me and considered in my medical decision making (see chart for details).     Pt with hx of rhematoid arthritis here with atraumatic right ankle, calf, knee pain. Pt's exam unremarkable.  We will get x-rays of the ankle and lower extremity fuplex for further evaluation.  9:01 AM xrays negative. Venous duplex is negative.  Ultrasound did show cystic structure behind her knee.  I suspect this is most likely a Baker's cyst.  I suspect infectious disease.  She is afebrile, nontoxic-appearing.  Discussed continuing NSAIDs, will try Mobic, will also give tramadol for severe pain, patient is worried that she will go home and the pain gets worse.  Pain apparently has been debilitating at home to the point that she has not been able to sleep.  We discussed keeping leg elevated, icing it.  Following up with family doctor closely. Return precautions such as fever, worsening swelling, pain, skin redness or discoloration discussed.   Vitals:   04/14/18 0526 04/14/18 0609 04/14/18 0800  BP: (!) 158/86  (!) 168/94  Pulse: 93  84  Resp: 18  17  Temp: 99.1 F (37.3 C)    TempSrc: Oral    SpO2: 97%  96%  Weight:  113.9 kg (251 lb)   Height:  5' 2.75" (1.594 m)      Final Clinical Impressions(s) / ED Diagnoses   Final diagnoses:  Right leg pain  Rheumatoid arthritis involving right ankle, unspecified rheumatoid factor presence (HCC)  Synovial cyst of right popliteal space    ED Discharge Orders    None       Jeannett Senior, PA-C 04/14/18 [place], MD 04/14/18 [phone]

## 2018-04-14 NOTE — ED Triage Notes (Signed)
Pt has been having R leg pain from ankle radiating up into leg x 1 week. No swelling noted,. Has been taken ibuprofen with relief until tonight.

## 2018-04-14 NOTE — Discharge Instructions (Addendum)
Take medications as prescribed. Stop ibuprofen when taking mobic. Also do not take any NSAID medications such as mobic or ibuprofen if restart your blood thinners. Keep leg elevated. Follow up with family doctor.

## 2018-04-14 NOTE — ED Notes (Signed)
Pt discharged from ED; instructions provided and scripts given; Pt encouraged to return to ED if symptoms worsen and to f/u with PCP; Pt verbalized understanding of all instructions 

## 2018-07-11 ENCOUNTER — Other Ambulatory Visit: Payer: Self-pay | Admitting: Cardiovascular Disease

## 2018-07-11 DIAGNOSIS — I4892 Unspecified atrial flutter: Secondary | ICD-10-CM

## 2018-07-11 MED ORDER — FUROSEMIDE 20 MG PO TABS: 20.0000 mg | ORAL_TABLET | Freq: Two times a day (BID) | ORAL | 0 refills | Status: DC

## 2018-07-11 MED ORDER — METOPROLOL TARTRATE 25 MG PO TABS: 12.5000 mg | ORAL_TABLET | Freq: Two times a day (BID) | ORAL | 0 refills | Status: DC

## 2018-07-11 MED ORDER — LOSARTAN POTASSIUM 25 MG PO TABS: 25.0000 mg | ORAL_TABLET | Freq: Every day | ORAL | 0 refills | Status: DC

## 2018-07-11 NOTE — Telephone Encounter (Signed)
Pt's medication was sent to pt's pharmacy as requested. Confirmation received.  °

## 2018-09-18 ENCOUNTER — Encounter: Payer: Self-pay | Admitting: Cardiovascular Disease

## 2018-10-01 ENCOUNTER — Inpatient Hospital Stay (HOSPITAL_COMMUNITY)
Admission: EM | Admit: 2018-10-01 | Discharge: 2018-10-03 | DRG: 193 | Disposition: A | Discharge status: Home Health | Payer: Medicaid Other | Attending: Internal Medicine | Admitting: Internal Medicine

## 2018-10-01 ENCOUNTER — Emergency Department (HOSPITAL_COMMUNITY): Payer: Medicaid Other

## 2018-10-01 ENCOUNTER — Encounter (HOSPITAL_COMMUNITY): Payer: Self-pay | Admitting: Emergency Medicine

## 2018-10-01 DIAGNOSIS — M069 Rheumatoid arthritis, unspecified: Secondary | ICD-10-CM | POA: Diagnosis present

## 2018-10-01 DIAGNOSIS — E876 Hypokalemia: Secondary | ICD-10-CM | POA: Diagnosis present

## 2018-10-01 DIAGNOSIS — I10 Essential (primary) hypertension: Secondary | ICD-10-CM | POA: Diagnosis present

## 2018-10-01 DIAGNOSIS — I081 Rheumatic disorders of both mitral and tricuspid valves: Secondary | ICD-10-CM | POA: Diagnosis present

## 2018-10-01 DIAGNOSIS — Z9114 Patient's other noncompliance with medication regimen: Secondary | ICD-10-CM

## 2018-10-01 DIAGNOSIS — Z794 Long term (current) use of insulin: Secondary | ICD-10-CM

## 2018-10-01 DIAGNOSIS — E119 Type 2 diabetes mellitus without complications: Secondary | ICD-10-CM

## 2018-10-01 DIAGNOSIS — Z87891 Personal history of nicotine dependence: Secondary | ICD-10-CM

## 2018-10-01 DIAGNOSIS — I5033 Acute on chronic diastolic (congestive) heart failure: Secondary | ICD-10-CM

## 2018-10-01 DIAGNOSIS — Z7901 Long term (current) use of anticoagulants: Secondary | ICD-10-CM

## 2018-10-01 DIAGNOSIS — I4892 Unspecified atrial flutter: Secondary | ICD-10-CM

## 2018-10-01 DIAGNOSIS — J180 Bronchopneumonia, unspecified organism: Secondary | ICD-10-CM

## 2018-10-01 DIAGNOSIS — I4891 Unspecified atrial fibrillation: Secondary | ICD-10-CM | POA: Diagnosis present

## 2018-10-01 DIAGNOSIS — J189 Pneumonia, unspecified organism: Secondary | ICD-10-CM | POA: Diagnosis not present

## 2018-10-01 DIAGNOSIS — I509 Heart failure, unspecified: Secondary | ICD-10-CM | POA: Insufficient documentation

## 2018-10-01 DIAGNOSIS — E1165 Type 2 diabetes mellitus with hyperglycemia: Secondary | ICD-10-CM | POA: Diagnosis not present

## 2018-10-01 DIAGNOSIS — Z79899 Other long term (current) drug therapy: Secondary | ICD-10-CM

## 2018-10-01 DIAGNOSIS — J159 Unspecified bacterial pneumonia: Principal | ICD-10-CM | POA: Diagnosis present

## 2018-10-01 DIAGNOSIS — I110 Hypertensive heart disease with heart failure: Secondary | ICD-10-CM | POA: Diagnosis present

## 2018-10-01 DIAGNOSIS — I4819 Other persistent atrial fibrillation: Secondary | ICD-10-CM | POA: Diagnosis not present

## 2018-10-01 DIAGNOSIS — Z888 Allergy status to other drugs, medicaments and biological substances status: Secondary | ICD-10-CM

## 2018-10-01 DIAGNOSIS — Y95 Nosocomial condition: Secondary | ICD-10-CM | POA: Diagnosis present

## 2018-10-01 DIAGNOSIS — Z6841 Body Mass Index (BMI) 40.0 and over, adult: Secondary | ICD-10-CM

## 2018-10-01 DIAGNOSIS — G4733 Obstructive sleep apnea (adult) (pediatric): Secondary | ICD-10-CM | POA: Diagnosis present

## 2018-10-01 DIAGNOSIS — Z791 Long term (current) use of non-steroidal anti-inflammatories (NSAID): Secondary | ICD-10-CM

## 2018-10-01 HISTORY — DX: Heart failure, unspecified: I50.9

## 2018-10-01 HISTORY — DX: Pneumonia, unspecified organism: J18.9

## 2018-10-01 LAB — CBC
HCT: 30.1 % — ABNORMAL LOW (ref 36.0–46.0)
Hemoglobin: 9.9 g/dL — ABNORMAL LOW (ref 12.0–15.0)
MCH: 30.2 pg (ref 26.0–34.0)
MCHC: 32.9 g/dL (ref 30.0–36.0)
MCV: 91.8 fL (ref 80.0–100.0)
Platelets: 341 10*3/uL (ref 150–400)
RBC: 3.28 MIL/uL — ABNORMAL LOW (ref 3.87–5.11)
RDW: 14.4 % (ref 11.5–15.5)
WBC: 7.9 10*3/uL (ref 4.0–10.5)
nRBC: 0.4 % — ABNORMAL HIGH (ref 0.0–0.2)

## 2018-10-01 LAB — I-STAT TROPONIN, ED: Troponin i, poc: 0.02 ng/mL (ref 0.00–0.08)

## 2018-10-01 LAB — GLUCOSE, CAPILLARY
GLUCOSE-CAPILLARY: 376 mg/dL — AB (ref 70–99)
Glucose-Capillary: 317 mg/dL — ABNORMAL HIGH (ref 70–99)

## 2018-10-01 LAB — BASIC METABOLIC PANEL
Anion gap: 15 (ref 5–15)
BUN: 5 mg/dL — ABNORMAL LOW (ref 6–20)
CO2: 24 mmol/L (ref 22–32)
Calcium: 9 mg/dL (ref 8.9–10.3)
Chloride: 96 mmol/L — ABNORMAL LOW (ref 98–111)
Creatinine, Ser: 0.73 mg/dL (ref 0.44–1.00)
GFR calc Af Amer: >60 mL/min (ref >60)
GFR calc non Af Amer: >60 mL/min (ref >60)
Glucose, Bld: 367 mg/dL — ABNORMAL HIGH (ref 70–99)
Potassium: 2.8 mmol/L — ABNORMAL LOW (ref 3.5–5.1)
Sodium: 135 mmol/L (ref 135–145)

## 2018-10-01 LAB — INFLUENZA PANEL BY PCR (TYPE A & B)
Influenza A By PCR: NEGATIVE
Influenza B By PCR: NEGATIVE

## 2018-10-01 LAB — D-DIMER, QUANTITATIVE (NOT AT ARMC): D-Dimer, Quant: 2.69 ug/mL-FEU — ABNORMAL HIGH (ref 0.00–0.50)

## 2018-10-01 LAB — BRAIN NATRIURETIC PEPTIDE: B Natriuretic Peptide: 287.4 pg/mL — ABNORMAL HIGH (ref 0.0–100.0)

## 2018-10-01 MED ORDER — SODIUM CHLORIDE 0.9 % IV SOLN
1.0000 g | INTRAVENOUS | Status: DC
  Administered 2018-10-02: 1 g via INTRAVENOUS
  Filled 2018-10-01 (×2): qty 10

## 2018-10-01 MED ORDER — ACETAMINOPHEN 325 MG PO TABS: 650.0000 mg | ORAL_TABLET | Freq: Four times a day (QID) | ORAL | Status: DC | PRN

## 2018-10-01 MED ORDER — FUROSEMIDE 10 MG/ML IJ SOLN: 60.0000 mg | Freq: Once | INTRAMUSCULAR | Status: DC

## 2018-10-01 MED ORDER — POTASSIUM CHLORIDE CRYS ER 20 MEQ PO TBCR: 20.0000 meq | EXTENDED_RELEASE_TABLET | Freq: Every day | ORAL | Status: DC

## 2018-10-01 MED ORDER — FUROSEMIDE 10 MG/ML IJ SOLN
40.0000 mg | Freq: Two times a day (BID) | INTRAMUSCULAR | Status: DC
  Administered 2018-10-02: 40 mg via INTRAVENOUS
  Filled 2018-10-01: qty 4

## 2018-10-01 MED ORDER — HYDROCORTISONE 1 % EX CREA: 1.0000 "application " | TOPICAL_CREAM | Freq: Two times a day (BID) | CUTANEOUS | Status: DC | PRN

## 2018-10-01 MED ORDER — ONDANSETRON HCL 4 MG/2ML IJ SOLN: 4.0000 mg | Freq: Four times a day (QID) | INTRAMUSCULAR | Status: DC | PRN

## 2018-10-01 MED ORDER — INSULIN ASPART PROT & ASPART (70-30 MIX) 100 UNIT/ML ~~LOC~~ SUSP
20.0000 [IU] | Freq: Two times a day (BID) | SUBCUTANEOUS | Status: DC
  Administered 2018-10-01: 20 [IU] via SUBCUTANEOUS
  Filled 2018-10-01 (×2): qty 10

## 2018-10-01 MED ORDER — SODIUM CHLORIDE 0.9 % IV SOLN: 500.0000 mg | Freq: Once | INTRAVENOUS | Status: DC

## 2018-10-01 MED ORDER — TRAMADOL HCL 50 MG PO TABS
50.0000 mg | ORAL_TABLET | Freq: Four times a day (QID) | ORAL | Status: DC | PRN
  Administered 2018-10-01 – 2018-10-02 (×2): 50 mg via ORAL
  Filled 2018-10-01 (×2): qty 1

## 2018-10-01 MED ORDER — POTASSIUM CHLORIDE CRYS ER 20 MEQ PO TBCR
40.0000 meq | EXTENDED_RELEASE_TABLET | Freq: Once | ORAL | Status: AC
  Administered 2018-10-01: 40 meq via ORAL
  Filled 2018-10-01: qty 2

## 2018-10-01 MED ORDER — APIXABAN 5 MG PO TABS
5.0000 mg | ORAL_TABLET | Freq: Two times a day (BID) | ORAL | Status: DC
  Administered 2018-10-01 – 2018-10-03 (×4): 5 mg via ORAL
  Filled 2018-10-01 (×5): qty 1

## 2018-10-01 MED ORDER — ACETAMINOPHEN 650 MG RE SUPP: 650.0000 mg | Freq: Four times a day (QID) | RECTAL | Status: DC | PRN

## 2018-10-01 MED ORDER — DICLOFENAC SODIUM 1 % TD GEL
2.0000 g | Freq: Four times a day (QID) | TRANSDERMAL | Status: DC
  Administered 2018-10-01 – 2018-10-03 (×5): 2 g via TOPICAL
  Filled 2018-10-01: qty 100

## 2018-10-01 MED ORDER — INSULIN ASPART 100 UNIT/ML ~~LOC~~ SOLN
0.0000 [IU] | Freq: Every day | SUBCUTANEOUS | Status: DC
  Administered 2018-10-02: 4 [IU] via SUBCUTANEOUS

## 2018-10-01 MED ORDER — IOPAMIDOL (ISOVUE-370) INJECTION 76%
100.0000 mL | Freq: Once | INTRAVENOUS | Status: AC | PRN
  Administered 2018-10-01: 100 mL via INTRAVENOUS

## 2018-10-01 MED ORDER — FOLIC ACID 1 MG PO TABS
1.0000 mg | ORAL_TABLET | Freq: Every day | ORAL | Status: DC
  Administered 2018-10-02 – 2018-10-03 (×2): 1 mg via ORAL
  Filled 2018-10-01 (×2): qty 1

## 2018-10-01 MED ORDER — LOSARTAN POTASSIUM 25 MG PO TABS
25.0000 mg | ORAL_TABLET | Freq: Every day | ORAL | Status: DC
  Administered 2018-10-02 – 2018-10-03 (×2): 25 mg via ORAL
  Filled 2018-10-01 (×2): qty 1

## 2018-10-01 MED ORDER — SODIUM CHLORIDE 0.9 % IV SOLN: 1.0000 g | Freq: Once | INTRAVENOUS | Status: DC

## 2018-10-01 MED ORDER — MELOXICAM 7.5 MG PO TABS
15.0000 mg | ORAL_TABLET | Freq: Every day | ORAL | Status: DC
  Administered 2018-10-02 – 2018-10-03 (×2): 15 mg via ORAL
  Filled 2018-10-01 (×2): qty 2

## 2018-10-01 MED ORDER — IOPAMIDOL (ISOVUE-370) INJECTION 76%
INTRAVENOUS | Status: AC
  Filled 2018-10-01: qty 100

## 2018-10-01 MED ORDER — POTASSIUM CHLORIDE 10 MEQ/100ML IV SOLN
10.0000 meq | INTRAVENOUS | Status: AC
  Administered 2018-10-01 (×2): 10 meq via INTRAVENOUS
  Filled 2018-10-01 (×2): qty 100

## 2018-10-01 MED ORDER — INSULIN ASPART 100 UNIT/ML ~~LOC~~ SOLN
0.0000 [IU] | Freq: Three times a day (TID) | SUBCUTANEOUS | Status: DC
  Administered 2018-10-01: 20 [IU] via SUBCUTANEOUS
  Administered 2018-10-02: 15 [IU] via SUBCUTANEOUS
  Administered 2018-10-02: 7 [IU] via SUBCUTANEOUS
  Administered 2018-10-02: 15 [IU] via SUBCUTANEOUS
  Administered 2018-10-03: 4 [IU] via SUBCUTANEOUS

## 2018-10-01 MED ORDER — POTASSIUM CHLORIDE 10 MEQ/100ML IV SOLN: 10.0000 meq | Freq: Once | INTRAVENOUS | Status: DC

## 2018-10-01 MED ORDER — METOPROLOL TARTRATE 12.5 MG HALF TABLET
12.5000 mg | ORAL_TABLET | Freq: Two times a day (BID) | ORAL | Status: DC
  Administered 2018-10-01 – 2018-10-03 (×4): 12.5 mg via ORAL
  Filled 2018-10-01 (×4): qty 1

## 2018-10-01 MED ORDER — FUROSEMIDE 10 MG/ML IJ SOLN
20.0000 mg | Freq: Once | INTRAMUSCULAR | Status: AC
  Administered 2018-10-01: 20 mg via INTRAVENOUS
  Filled 2018-10-01: qty 2

## 2018-10-01 MED ORDER — ONDANSETRON HCL 4 MG PO TABS: 4.0000 mg | ORAL_TABLET | Freq: Four times a day (QID) | ORAL | Status: DC | PRN

## 2018-10-01 MED ORDER — AZITHROMYCIN 250 MG PO TABS
500.0000 mg | ORAL_TABLET | ORAL | Status: DC
  Administered 2018-10-02: 500 mg via ORAL
  Filled 2018-10-01: qty 2

## 2018-10-01 MED ORDER — POTASSIUM CHLORIDE CRYS ER 20 MEQ PO TBCR: 40.0000 meq | EXTENDED_RELEASE_TABLET | Freq: Once | ORAL | Status: DC

## 2018-10-01 NOTE — H&P (Signed)
History and Physical    GALINA HADDOX OZH:[id number] DOB: [birth year] DOA: 10/01/2018  PCP: [name], MD   Patient coming from: Home  I have personally briefly reviewed patient's old medical records in [place]  Chief Complaint: Shortness of breath and cough  HPI: Kathleen Kane is a 57 y.o. female with medical history significant of chronic diastolic heart failure, persistent atrial fibrillation not on Eliquis for the last many weeks since he ran out of her Eliquis, diabetes mellitus type 2, hypertension, rheumatoid arthritis, morbid obesity presented with shortness of breath and cough for the last 4 days.  Her symptoms are progressively getting worse.  She complains of cough with productive white sputum along with intermittent central dull/pressure-like chest pain which gets worse with cough, with no radiation.  No fever, nausea, vomiting, abdominal pain, diarrhea, dysuria.  Patient has noticed some increased swelling of her legs.  She states that she is able to take Lasix once a day but has been taking 3 times a day for the last few days because of her symptoms.  No sick contacts.  No recent travel.  She has not been taking her Eliquis for the last few weeks as she ran out of it.  No loss of consciousness, seizures, headache.  ED Course: Chest x-ray showed some pulmonary vascular congestion for which she was given Lasix.  CT of the chest was ordered.  Hospitalist service was called to evaluate the patient.  Patient was placed on oxygen via nasal cannula.  Review of Systems: As per HPI otherwise 10 point systems were reviewed and were negative.   Past Medical History:  Diagnosis Date  . Anxiety   . Atrial fibrillation, persistent   . CHF (congestive heart failure) ([place]) 2013   No echo found from that time, most likely diastolic  . Depression   . Diabetes mellitus without complication ([place])   . Hypertension   . Obesity   . Obstructive sleep apnea    Sleep study  performed 10/18/2008. AHI-6.8/hr, during REM-27.3/hr. RDI-25.0/hr, during REM-40.9/hr. avg o2 sat during REM and NREM 95%  . Rheumatoid arthritis [facility])     Past Surgical History:  Procedure Laterality Date  . CARDIOVASCULAR STRESS TEST  02/16/2013   no significant EKG changes with Lexiscan, normal LV function and normal wall function  . CESAREAN SECTION     x2  . DOPPLER ECHOCARDIOGRAPHY  01/24/2008   EF 55-60%, no diagnostic evidence of LV wall motion abnormalities, LV wall thickness was mild-moderately increased.  Marland Kitchen LAPAROTOMY Right 02/27/2013   Procedure: EXPLORATORY LAPAROTOMY, right salpingo-oopherectomy;  Surgeon: [name], MD;  Location: [facility];  Service: Gynecology;  Laterality: Right;  . left breast cyst    . SALPINGOOPHORECTOMY Right 02/27/2013   Procedure: SALPINGO OOPHORECTOMY;  Surgeon: [name], MD;  Location: [facility];  Service: Gynecology;  Laterality: Right;   Social history  reports that she quit smoking about 4 years ago. Her smoking use included cigarettes. She has a 18.00 pack-year smoking history. She has never used smokeless tobacco. She reports that she does not drink alcohol or use drugs.  Allergies  Allergen Reactions  . Ace Inhibitors Cough  . Lisinopril Cough    Family History  Problem Relation Age of Onset  . Hypertension Mother   . Diabetes Mother   . Hypertension Father   . Diabetes Other   . Diabetes Paternal Grandmother   . Diabetes Sister   . Heart Problems Sister   . Kidney failure Sister   .  Hypertension Brother   . Diabetes Brother     Prior to Admission medications   Medication Sig Start Date End Date Taking? Authorizing Provider  apixaban (ELIQUIS) 5 MG TABS tablet Take 1 tablet (5 mg total) by mouth 2 (two) times daily. 03/15/16   Josue Hector, MD  diclofenac sodium (VOLTAREN) 1 % GEL Apply 2 g topically 4 (four) times daily. 04/14/18   Kirichenko, Lahoma Rocker, PA-C  folic acid (FOLVITE) 1 MG tablet Take 1 mg by mouth daily.  08/19/17   [provider]  furosemide (LASIX) 20 MG tablet Take 1 tablet (20 mg total) by mouth 2 (two) times daily. Take 1 tablet extra as needed for edema.Please make appt with Dr.Nishan1st attempt 07/11/18   Josue Hector, MD  glipiZIDE (GLUCOTROL) 5 MG tablet Take 10 mg by mouth every morning. 12/20/15   [provider]  hydrocortisone 2.5 % cream Apply 1 application topically 2 (two) times daily as needed for itching. 03/09/18   [provider]  ibuprofen (ADVIL,MOTRIN) 800 MG tablet Take 1 tablet by mouth as needed. 08/23/17   [provider]  KLOR-CON M20 20 MEQ tablet TAKE 1 TABLET BY MOUTH EVERY DAY 04/20/16   Richardson Dopp T, PA-C  losartan (COZAAR) 25 MG tablet Take 1 tablet (25 mg total) by mouth daily. Please make yearly appt with Dr. Johnsie Cancel for December for future refills. 1st attempt 07/11/18   Josue Hector, MD  meloxicam (MOBIC) 15 MG tablet Take 1 tablet (15 mg total) by mouth daily. 04/14/18   Kirichenko, Lahoma Rocker, PA-C  metFORMIN (GLUCOPHAGE) 500 MG tablet Take 1 tablet (500 mg total) by mouth 2 (two) times daily. 04/10/17   Lysbeth Penner, FNP  methotrexate (RHEUMATREX) 2.5 MG tablet Take 20 mg by mouth every Monday. Caution:Chemotherapy. Protect from light.    [provider]  metoprolol tartrate (LOPRESSOR) 25 MG tablet Take 0.5 tablets (12.5 mg total) by mouth 2 (two) times daily. Please make appt with Dr. Johnsie Cancel for December for future refills. 1st attempt 07/11/18   Josue Hector, MD  NOVOLOG MIX 70/30 FLEXPEN (70-30) 100 UNIT/ML FlexPen Inject 0.2-0.4 mLs (20-40 Units total) into the skin 2 (two) times daily with a meal. She takes 20 units in the morning and 40 units in the evening. 04/10/17   Lysbeth Penner, FNP  predniSONE (DELTASONE) 5 MG tablet Take 10 mg by mouth daily with breakfast.     [provider]  traMADol (ULTRAM) 50 MG tablet Take 1 tablet (50 mg total) by mouth every 6 (six) hours as needed. 04/14/18   Jeannett Senior, PA-C    Physical Exam: Vitals:   10/01/18 1222 10/01/18 1330 10/01/18 1430 10/01/18 1500  BP: 112/89 (!) 152/86 (!) 142/77 (!) 147/83  Pulse: 95 (!) 53 (!) 105 (!) 110  Resp: 17 (!) 21 20 (!) 21  Temp: 97.7 F (36.5 C)     TempSrc: Oral     SpO2: 92% 99% 97% 100%  Weight:      Height:        Constitutional: NAD, calm, comfortable Vitals:   10/01/18 1222 10/01/18 1330 10/01/18 1430 10/01/18 1500  BP: 112/89 (!) 152/86 (!) 142/77 (!) 147/83  Pulse: 95 (!) 53 (!) 105 (!) 110  Resp: 17 (!) 21 20 (!) 21  Temp: 97.7 F (36.5 C)     TempSrc: Oral     SpO2: 92% 99% 97% 100%  Weight:      Height:  Eyes: PERRL, lids and conjunctivae normal ENMT: Mucous membranes are moist. Posterior pharynx clear of any exudate or lesions. Neck: normal, supple, no masses, no thyromegaly Respiratory: bilateral decreased breath sounds at bases, no wheezing; scattered crackles.  Intermittently tachypneic. No accessory muscle use..  Able to complete sentences Cardiovascular: S1 S2 positive, rate controlled. No extremity edema. 1+ pedal pulses.  Abdomen: Morbidly obese, no tenderness, no masses palpated. No hepatosplenomegaly. Bowel sounds positive.  Musculoskeletal: no clubbing / cyanosis. No joint deformity upper and lower extremities.  Skin: no rashes, lesions, ulcers. No induration Neurologic: CN 2-12 grossly intact. Moving extremities. No focal neurologic deficits.  Psychiatric: Normal judgment and insight. Alert and oriented x 3. Normal mood.   Labs on Admission: I have personally reviewed following labs and imaging studies  CBC: Recent Labs  Lab 10/01/18 1429  WBC 7.9  HGB 9.9*  HCT 30.1*  MCV 91.8  PLT 349   Basic Metabolic Panel: Recent Labs  Lab 10/01/18 1429  NA 135  K 2.8*  CL 96*  CO2 24  GLUCOSE 367*  BUN 5*  CREATININE 0.73  CALCIUM 9.0   GFR: Estimated Creatinine Clearance: 90.1 mL/min (by C-G formula based on SCr of 0.73 mg/dL). Liver Function  Tests: No results for input(s): AST, ALT, ALKPHOS, BILITOT, PROT, ALBUMIN in the last 168 hours. No results for input(s): LIPASE, AMYLASE in the last 168 hours. No results for input(s): AMMONIA in the last 168 hours. Coagulation Profile: No results for input(s): INR, PROTIME in the last 168 hours. Cardiac Enzymes: No results for input(s): CKTOTAL, CKMB, CKMBINDEX, TROPONINI in the last 168 hours. BNP (last 3 results) No results for input(s): PROBNP in the last 8760 hours. HbA1C: No results for input(s): HGBA1C in the last 72 hours. CBG: No results for input(s): GLUCAP in the last 168 hours. Lipid Profile: No results for input(s): CHOL, HDL, LDLCALC, TRIG, CHOLHDL, LDLDIRECT in the last 72 hours. Thyroid Function Tests: No results for input(s): TSH, T4TOTAL, FREET4, T3FREE, THYROIDAB in the last 72 hours. Anemia Panel: No results for input(s): VITAMINB12, FOLATE, FERRITIN, TIBC, IRON, RETICCTPCT in the last 72 hours. Urine analysis:    Component Value Date/Time   COLORURINE STRAW (A) 03/07/2017 1550   APPEARANCEUR CLEAR 03/07/2017 1550   LABSPEC 1.003 (L) 03/07/2017 1550   PHURINE 6.0 03/07/2017 1550   GLUCOSEU 150 (A) 03/07/2017 1550   HGBUR NEGATIVE 03/07/2017 1550   BILIRUBINUR NEGATIVE 03/07/2017 1550   KETONESUR NEGATIVE 03/07/2017 1550   PROTEINUR NEGATIVE 03/07/2017 1550   UROBILINOGEN 1.0 07/22/2012 1700   NITRITE NEGATIVE 03/07/2017 1550   LEUKOCYTESUR TRACE (A) 03/07/2017 1550    Radiological Exams on Admission: Dg Chest 2 View  Result Date: 10/01/2018 CLINICAL DATA:  Acute shortness of breath and chest pain for 4 days. EXAM: CHEST - 2 VIEW COMPARISON:  03/07/2017 and prior radiograph FINDINGS: Cardiomegaly with pulmonary vascular congestion noted. Equivocal LEFT UPPER lung opacity/airspace disease noted. There is no evidence of pulmonary edema, pleural effusion, or pneumothorax. No acute bony abnormalities are identified. IMPRESSION: 1. Cardiomegaly with pulmonary  vascular congestion. 2. Equivocal LEFT UPPER lung opacity which may represent airspace disease/pneumonia. Electronically Signed   By: Margarette Canada M.D.   On: 10/01/2018 13:45   Ct Angio Chest Pe W And/or Wo Contrast  Result Date: 10/01/2018 CLINICAL DATA:  57 year old female with history of chest pain and shortness of breath for the past 4 days. Right-sided leg pain. EXAM: CT ANGIOGRAPHY CHEST WITH CONTRAST TECHNIQUE: Multidetector CT imaging of the chest  was performed using the standard protocol during bolus administration of intravenous contrast. Multiplanar CT image reconstructions and MIPs were obtained to evaluate the vascular anatomy. CONTRAST:  12mL ISOVUE-370 IOPAMIDOL (ISOVUE-370) INJECTION 76% COMPARISON:  None. FINDINGS: Cardiovascular: No filling defects within the pulmonary arterial tree to suggest underlying pulmonary embolism. Heart size is mildly enlarged. There is no significant pericardial fluid, thickening or pericardial calcification. There is aortic atherosclerosis, as well as atherosclerosis of the great vessels of the mediastinum and the coronary arteries, including calcified atherosclerotic plaque in the left main, left anterior descending, left circumflex and right coronary arteries. 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 Mediastinum/Nodes: Numerous prominent borderline enlarged and mildly enlarged mediastinal and bilateral hilar lymph nodes are noted, measuring up to 1.7 cm in short axis in the subcarinal nodal station. Esophagus is unremarkable in appearance. Several prominent borderline enlarged and mildly enlarged bilateral axillary lymph nodes are noted measuring up to 1.1 cm in the right axillary region (axial image 42 of series 7). Lungs/Pleura: Patchy multifocal ground-glass attenuation and consolidative airspace disease in a peribronchovascular distribution scattered throughout all aspects of the  lungs bilaterally. No pleural effusions. Upper Abdomen: Diffuse low-attenuation throughout the hepatic parenchyma, indicative of hepatic steatosis. Musculoskeletal: There are no aggressive appearing lytic or blastic lesions noted in the visualized portions of the skeleton. Review of the MIP images confirms the above findings. IMPRESSION: 1. No evidence of pulmonary embolism. 2. The appearance of the lungs is highly concerning for severe multilobar bronchopneumonia. Some component of underlying pulmonary edema from congestive heart failure is also possible, but not strongly favored. 3. Cardiomegaly. 4. Aortic atherosclerosis, in addition to left main and 3 vessel coronary artery disease. Please note that although the presence of coronary artery calcium documents the presence of coronary artery disease, the severity of this disease and any potential stenosis cannot be assessed on this non-gated CT examination. Assessment for potential risk factor modification, dietary therapy or pharmacologic therapy may be warranted, if clinically indicated. Aortic Atherosclerosis (ICD10-I70.0). Electronically Signed   By: Vinnie Langton M.D.   On: 10/01/2018 16:32   Assessment/Plan Principal Problem:   Community acquired pneumonia Active Problems:   Atrial fibrillation ([place])   Diabetes mellitus ([place])   Essential hypertension   Morbid obesity ([place])   Acute on chronic diastolic CHF (congestive heart failure) ([place])  Multilobar bacterial community-acquired pneumonia -CT of the chest indicates probable multilobar pneumonia -Rocephin and Zithromax has been started in the ED.  Continue these antibiotics.  Send blood cultures.  Urine Legionella and streptococcal antigen.  Rapid influenza A and B.  Sputum culture -Oxygen supplementation if needed -Incentive spirometry  Probable acute on chronic diastolic CHF -Strict input and output.  Daily weights.  Lasix 40 mg IV every 12 hours.  2D echo.  Continue losartan and  metoprolol -Outpatient follow-up with cardiology  Diabetes mellitus type 2 uncontrolled with hyperglycemia -Continue 70/30 insulin along with Accu-Cheks with insulin sliding scale coverage.  Hemoglobin A1c.  Hold oral home meds  Hypertension -Monitor blood pressure.  Continue metoprolol, losartan, Lasix  Persistent A. fib -Intermittently tachycardic.  Patient has been noncompliant with Eliquis for the last few weeks -resume Eliquis.  Continue metoprolol.  Hypokalemia -Replace.  Repeat a.m. labs including magnesium  Morbid obesity -Outpatient follow-up  Rheumatoid arthritis -Hold home meds for now.  Outpatient follow-up with rheumatology    DVT prophylaxis: Eliquis Code Status: Full Family Communication: Husband at bedside Disposition Plan: Home in 1 to  2 days if clinically improved Consults called: None Admission status: Observation/telemetry  Severity of Illness: The appropriate patient status for this patient is OBSERVATION. Observation status is judged to be reasonable and necessary in order to provide the required intensity of service to ensure the patient's safety. The patient's presenting symptoms, physical exam findings, and initial radiographic and laboratory data in the context of their medical condition is felt to place them at decreased risk for further clinical deterioration. Furthermore, it is anticipated that the patient will be medically stable for discharge from the hospital within 2 midnights of admission. The following factors support the patient status of observation.   " The patient's presenting symptoms include shortness of breath and cough. " The physical exam findings include scattered crackles. " The initial radiographic and laboratory data are multilobar pneumonia.      Aline August MD [facility] Pager [phone]  If 7PM-7AM, please contact night-coverage [email or web address] Password [email or web address]  10/01/2018, 4:55 PM

## 2018-10-01 NOTE — ED Notes (Signed)
Pt reports not taking her blood thinners for about a month due to being out and forgetting to take it.

## 2018-10-01 NOTE — ED Triage Notes (Signed)
Pt arrives with reports of severe SOB and CP for multiple days but worsened today. States she is not able to breath laying down.  States she has not been checking her blood sugar for 4 days because she has been so tired and weak.

## 2018-10-01 NOTE — Progress Notes (Signed)
ANTICOAGULATION CONSULT NOTE - Initial Consult  Pharmacy Consult for Eliquis Indication: atrial fibrillation   Patient Measurements: Height: 5\' 2"  (157.5 cm) Weight: 250 lb (113.4 kg) IBW/kg (Calculated) : 50.1   Vital Signs: Temp: 97.7 F (36.5 C) (11/24 1222) Temp Source: Oral (11/24 1222) BP: 147/83 (11/24 1500) Pulse Rate: 110 (11/24 1500)  Labs: Recent Labs    10/01/18 1429  HGB 9.9*  HCT 30.1*  PLT 341  CREATININE 0.73      Medical History: Past Medical History:  Diagnosis Date  . Anxiety   . Atrial fibrillation, persistent   . CHF (congestive heart failure) ([place]) 2013   No echo found from that time, most likely diastolic  . Depression   . Diabetes mellitus without complication ([place])   . Hypertension   . Obesity   . Obstructive sleep apnea    Sleep study performed 10/18/2008. AHI-6.8/hr, during REM-27.3/hr. RDI-25.0/hr, during REM-40.9/hr. avg o2 sat during REM and NREM 95%  . Rheumatoid arthritis ([place])     Assessment: Admitted with SOB. Patient is on Eliquis prior to admission but she has not taken her Eliquis in a few weeks. Will continue Eliquis as PTA. hgb 9.9, plts wnl.   Goal of Therapy:  Monitor platelets by anticoagulation protocol: Yes    Plan:  -Eliquis 5 mg po bid   Harvel Quale 10/01/2018,5:02 PM

## 2018-10-01 NOTE — ED Notes (Signed)
Patient transported to CT 

## 2018-10-01 NOTE — ED Provider Notes (Signed)
[facility] EMERGENCY DEPARTMENT Provider Note   CSN: [id number] Arrival date & time: 10/01/18  1212     History   Chief Complaint Chief Complaint  Patient presents with  . Shortness of Breath  . Chest Pain    HPI Kathleen Kane is a 57 y.o. female with history of persistent A. fib, CHF, diabetes mellitus, hypertension, obesity, OSA, anxiety, rheumatoid arthritis presents for evaluation of acute onset, progressively worsening chest pain and shortness of breath for 4 days.  She notes generalized weakness, dyspnea on exertion, and orthopnea.  Chest pain is central dull, does not radiate and is constant.  Worsens with exertion, improves with rest.  Has not been taking her Eliquis for 1 to 2 months because she has been out.  No recent travel or surgeries, no prior history of DVT or PE.  She has tried her home Lasix, metoprolol, and potassium without improvement in her symptoms. Endorses nausea but no vomiting. Denies fever.   The history is provided by the patient.    Past Medical History:  Diagnosis Date  . Anxiety   . Atrial fibrillation, persistent   . CHF (congestive heart failure) ([place]) 2013   No echo found from that time, most likely diastolic  . Depression   . Diabetes mellitus without complication ([place])   . Hypertension   . Obesity   . Obstructive sleep apnea    Sleep study performed 10/18/2008. AHI-6.8/hr, during REM-27.3/hr. RDI-25.0/hr, during REM-40.9/hr. avg o2 sat during REM and NREM 95%  . Rheumatoid arthritis [facility])     Patient Active Problem List   Diagnosis Date Noted  . CHF exacerbation ([place]) 10/01/2018  . Acute on chronic diastolic heart failure ([place]) 08/08/2015  . Morbid obesity ([place]) 05/06/2015  . Essential hypertension 04/10/2015  . Numbness and tingling in left hand 10/01/2013  . Tobacco abuse 10/01/2013  . Elevated CA-125 02/06/2013  . Pelvic mass in female 01/29/2013  . Diabetes mellitus ([place]) 08/10/2012  . Atrial fibrillation  ([place])   . Chronic diastolic CHF (congestive heart failure) ([place])     Past Surgical History:  Procedure Laterality Date  . CARDIOVASCULAR STRESS TEST  02/16/2013   no significant EKG changes with Lexiscan, normal LV function and normal wall function  . CESAREAN SECTION     x2  . DOPPLER ECHOCARDIOGRAPHY  01/24/2008   EF 55-60%, no diagnostic evidence of LV wall motion abnormalities, LV wall thickness was mild-moderately increased.  Marland Kitchen LAPAROTOMY Right 02/27/2013   Procedure: EXPLORATORY LAPAROTOMY, right salpingo-oopherectomy;  Surgeon: [name], MD;  Location: [facility] ORS;  Service: Gynecology;  Laterality: Right;  . left breast cyst    . SALPINGOOPHORECTOMY Right 02/27/2013   Procedure: SALPINGO OOPHORECTOMY;  Surgeon: [name], MD;  Location: [facility];  Service: Gynecology;  Laterality: Right;     OB History   None      Home Medications    Prior to Admission medications   Medication Sig Start Date End Date Taking? Authorizing Provider  apixaban (ELIQUIS) 5 MG TABS tablet Take 1 tablet (5 mg total) by mouth 2 (two) times daily. 03/15/16   Josue Hector, MD  diclofenac sodium (VOLTAREN) 1 % GEL Apply 2 g topically 4 (four) times daily. 04/14/18   Kirichenko, Lahoma Rocker, PA-C  folic acid (FOLVITE) 1 MG tablet Take 1 mg by mouth daily. 08/19/17   [provider]  furosemide (LASIX) 20 MG tablet Take 1 tablet (20 mg total) by mouth 2 (two) times daily. Take 1 tablet  extra as needed for edema.Please make appt with Dr.Nishan1st attempt 07/11/18   Josue Hector, MD  glipiZIDE (GLUCOTROL) 5 MG tablet Take 10 mg by mouth every morning. 12/20/15   [provider]  hydrocortisone 2.5 % cream Apply 1 application topically 2 (two) times daily as needed for itching. 03/09/18   [provider]  ibuprofen (ADVIL,MOTRIN) 800 MG tablet Take 1 tablet by mouth as needed. 08/23/17   [provider]  KLOR-CON M20 20 MEQ tablet TAKE 1 TABLET BY MOUTH EVERY DAY 04/20/16    Richardson Dopp T, PA-C  losartan (COZAAR) 25 MG tablet Take 1 tablet (25 mg total) by mouth daily. Please make yearly appt with Dr. Johnsie Cancel for December for future refills. 1st attempt 07/11/18   Josue Hector, MD  meloxicam (MOBIC) 15 MG tablet Take 1 tablet (15 mg total) by mouth daily. 04/14/18   Kirichenko, Lahoma Rocker, PA-C  metFORMIN (GLUCOPHAGE) 500 MG tablet Take 1 tablet (500 mg total) by mouth 2 (two) times daily. 04/10/17   Lysbeth Penner, FNP  methotrexate (RHEUMATREX) 2.5 MG tablet Take 20 mg by mouth every Monday. Caution:Chemotherapy. Protect from light.    [provider]  metoprolol tartrate (LOPRESSOR) 25 MG tablet Take 0.5 tablets (12.5 mg total) by mouth 2 (two) times daily. Please make appt with Dr. Johnsie Cancel for December for future refills. 1st attempt 07/11/18   Josue Hector, MD  NOVOLOG MIX 70/30 FLEXPEN (70-30) 100 UNIT/ML FlexPen Inject 0.2-0.4 mLs (20-40 Units total) into the skin 2 (two) times daily with a meal. She takes 20 units in the morning and 40 units in the evening. 04/10/17   Lysbeth Penner, FNP  predniSONE (DELTASONE) 5 MG tablet Take 10 mg by mouth daily with breakfast.     [provider]  traMADol (ULTRAM) 50 MG tablet Take 1 tablet (50 mg total) by mouth every 6 (six) hours as needed. 04/14/18   Jeannett Senior, PA-C    Family History Family History  Problem Relation Age of Onset  . Hypertension Mother   . Diabetes Mother   . Hypertension Father   . Diabetes Other   . Diabetes Paternal Grandmother   . Diabetes Sister   . Heart Problems Sister   . Kidney failure Sister   . Hypertension Brother   . Diabetes Brother     Social History Social History   Tobacco Use  . Smoking status: Former Smoker    Packs/day: 0.75    Years: 24.00    Pack years: 18.00    Types: Cigarettes    Last attempt to quit: 06/08/2014    Years since quitting: 4.3  . Smokeless tobacco: Never Used  . Tobacco comment: smoking cessation info given.   Substance  Use Topics  . Alcohol use: No    Alcohol/week: 0.0 standard drinks  . Drug use: No     Allergies   Ace inhibitors and Lisinopril   Review of Systems Review of Systems  Constitutional: Negative for chills and fever.  Respiratory: Positive for cough and shortness of breath.   Cardiovascular: Positive for chest pain. Negative for leg swelling.  Gastrointestinal: Positive for nausea. Negative for abdominal pain and vomiting.  Neurological: Negative for weakness, numbness and headaches.  All other systems reviewed and are negative.    Physical Exam Updated Vital Signs BP (!) 147/83   Pulse (!) 110   Temp 97.7 F (36.5 C) (Oral)   Resp (!) 21   Ht 5\' 2"  (1.575 m)  Wt 113.4 kg   LMP 10/28/2013 Comment: perimenopausal  SpO2 100%   BMI 45.73 kg/m   Physical Exam  Constitutional: She appears well-developed and well-nourished. No distress.  HENT:  Head: Normocephalic and atraumatic.  Eyes: Conjunctivae are normal. Right eye exhibits no discharge. Left eye exhibits no discharge.  Neck: No JVD present. No tracheal deviation present.  Cardiovascular:  Irregularly irregular rhythm.  2+ radial and DP/PT pulses bilaterally, Homans sign absent bilaterally, no pitting edema noted.  Comments are soft, no palpable cords  Pulmonary/Chest: Effort normal. She exhibits no tenderness.  Globally diminished breath sounds.  SPO2 saturations 90 to 93% on room air, improved on 2 L via nasal cannula.  Abdominal: She exhibits no distension.  Musculoskeletal: She exhibits no edema.       Right lower leg: Normal. She exhibits no tenderness and no edema.       Left lower leg: Normal. She exhibits no tenderness and no edema.  Neurological: She is alert.  Skin: Skin is warm and dry. No erythema.  Psychiatric: She has a normal mood and affect. Her behavior is normal.  Nursing note and vitals reviewed.    ED Treatments / Results  Labs (all labs ordered are listed, but only abnormal results are  displayed) Labs Reviewed  BASIC METABOLIC PANEL - Abnormal; Notable for the following components:      Result Value   Potassium 2.8 (*)    Chloride 96 (*)    Glucose, Bld 367 (*)    BUN 5 (*)    All other components within normal limits  CBC - Abnormal; Notable for the following components:   RBC 3.28 (*)    Hemoglobin 9.9 (*)    HCT 30.1 (*)    nRBC 0.4 (*)    All other components within normal limits  D-DIMER, QUANTITATIVE (NOT AT [facility]) - Abnormal; Notable for the following components:   D-Dimer, Quant 2.69 (*)    All other components within normal limits  BRAIN NATRIURETIC PEPTIDE - Abnormal; Notable for the following components:   B Natriuretic Peptide 287.4 (*)    All other components within normal limits  CULTURE, BLOOD (ROUTINE X 2)  CULTURE, BLOOD (ROUTINE X 2)  I-STAT TROPONIN, ED    EKG EKG Interpretation  Date/Time:  Sunday October 01 2018 12:20:24 EST Ventricular Rate:  113 PR Interval:    QRS Duration: 94 QT Interval:  354 QTC Calculation: 438 R Axis:   32 Text Interpretation:  Atrial fibrillation Paired ventricular premature complexes Nonspecific T abnormalities, diffuse leads no sig change from previous Confirmed by Charlesetta Shanks [phone]) on 10/01/2018 1:55:06 PM   Radiology Dg Chest 2 View  Result Date: 10/01/2018 CLINICAL DATA:  Acute shortness of breath and chest pain for 4 days. EXAM: CHEST - 2 VIEW COMPARISON:  03/07/2017 and prior radiograph FINDINGS: Cardiomegaly with pulmonary vascular congestion noted. Equivocal LEFT UPPER lung opacity/airspace disease noted. There is no evidence of pulmonary edema, pleural effusion, or pneumothorax. No acute bony abnormalities are identified. IMPRESSION: 1. Cardiomegaly with pulmonary vascular congestion. 2. Equivocal LEFT UPPER lung opacity which may represent airspace disease/pneumonia. Electronically Signed   By: Margarette Canada M.D.   On: 10/01/2018 13:45   Ct Angio Chest Pe W And/or Wo Contrast  Result Date:  10/01/2018 CLINICAL DATA:  57 year old female with history of chest pain and shortness of breath for the past 4 days. Right-sided leg pain. EXAM: CT ANGIOGRAPHY CHEST WITH CONTRAST TECHNIQUE: Multidetector CT imaging of the chest was performed  using the standard protocol during bolus administration of intravenous contrast. Multiplanar CT image reconstructions and MIPs were obtained to evaluate the vascular anatomy. CONTRAST:  154mL ISOVUE-370 IOPAMIDOL (ISOVUE-370) INJECTION 76% COMPARISON:  None. FINDINGS: Cardiovascular: No filling defects within the pulmonary arterial tree to suggest underlying pulmonary embolism. Heart size is mildly enlarged. There is no significant pericardial fluid, thickening or pericardial calcification. There is aortic atherosclerosis, as well as atherosclerosis of the great vessels of the mediastinum and the coronary arteries, including calcified atherosclerotic plaque in the left main, left anterior descending, left circumflex and right coronary arteries. 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 Mediastinum/Nodes: Numerous prominent borderline enlarged and mildly enlarged mediastinal and bilateral hilar lymph nodes are noted, measuring up to 1.7 cm in short axis in the subcarinal nodal station. Esophagus is unremarkable in appearance. Several prominent borderline enlarged and mildly enlarged bilateral axillary lymph nodes are noted measuring up to 1.1 cm in the right axillary region (axial image 42 of series 7). Lungs/Pleura: Patchy multifocal ground-glass attenuation and consolidative airspace disease in a peribronchovascular distribution scattered throughout all aspects of the lungs bilaterally. No pleural effusions. Upper Abdomen: Diffuse low-attenuation throughout the hepatic parenchyma, indicative of hepatic steatosis. Musculoskeletal: There are no aggressive appearing lytic or blastic lesions noted in the  visualized portions of the skeleton. Review of the MIP images confirms the above findings. IMPRESSION: 1. No evidence of pulmonary embolism. 2. The appearance of the lungs is highly concerning for severe multilobar bronchopneumonia. Some component of underlying pulmonary edema from congestive heart failure is also possible, but not strongly favored. 3. Cardiomegaly. 4. Aortic atherosclerosis, in addition to left main and 3 vessel coronary artery disease. Please note that although the presence of coronary artery calcium documents the presence of coronary artery disease, the severity of this disease and any potential stenosis cannot be assessed on this non-gated CT examination. Assessment for potential risk factor modification, dietary therapy or pharmacologic therapy may be warranted, if clinically indicated. Aortic Atherosclerosis (ICD10-I70.0). Electronically Signed   By: Vinnie Langton M.D.   On: 10/01/2018 16:32    Procedures Procedures (including critical care time)  Medications Ordered in ED Medications  iopamidol (ISOVUE-370) 76 % injection (has no administration in time range)  potassium chloride 10 mEq in 100 mL IVPB (10 mEq Intravenous New Bag/Given 10/01/18 1522)  cefTRIAXone (ROCEPHIN) 1 g in sodium chloride 0.9 % 100 mL IVPB (has no administration in time range)  azithromycin (ZITHROMAX) 500 mg in sodium chloride 0.9 % 250 mL IVPB (has no administration in time range)  furosemide (LASIX) injection 20 mg (20 mg Intravenous Given 10/01/18 1440)  potassium chloride SA (K-DUR,KLOR-CON) CR tablet 40 mEq (40 mEq Oral Given 10/01/18 1517)  iopamidol (ISOVUE-370) 76 % injection 100 mL (100 mLs Intravenous Contrast Given 10/01/18 1603)     Initial Impression / Assessment and Plan / ED Course  I have reviewed the triage vital signs and the nursing notes.  Pertinent labs & imaging results that were available during my care of the patient were reviewed by me and considered in my medical  decision making (see chart for details).     Patient presented with worsening shortness of breath, chest pain, and cough for the past 4 days.  She is afebrile, intermittently tachycardic and borderline hypoxic in the ED.  Improved with 2 L via nasal cannula.  Has not been taking her Eliquis for 2 months after running out.  Will obtain lab work  and imaging for further evaluation.  Lab work reviewed by me shows no leukocytosis, anemia with hemoglobin 9.9, hyperglycemia, and hypokalemia with potassium of 2.8.  She was given p.o. and IV potassium in the ED.  BUN is mildly elevated and a chest x-ray shows cardiomegaly with pulmonary vascular congestion and equivocal left upper lung opacity which could represent pneumonia.  She was given IV Lasix in the ED.  Given history of A. fib and poor compliance with her Eliquis a d-dimer was obtained which was elevated.  CTA shows no evidence of PE however does show evidence of severe multilobar bronchopneumonia with possible underlying pulmonary edema from CHF.  IV antibiotics initiated in the ED. Spoke with  Dr. Starla Link with [facility] hospitalist service who agrees to assume care of patient and bring her into the hospital for further evaluation and management.  Final Clinical Impressions(s) / ED Diagnoses   Final diagnoses:  Bronchopneumonia  Acute on chronic congestive heart failure, unspecified heart failure type [facility])    ED Discharge Orders    None       Renita Papa, PA-C 10/01/18 1644    Julianne Rice, MD 10/03/18 2251

## 2018-10-01 NOTE — Plan of Care (Signed)
Monitor Respiratory status. Continuous pulse ox. Plan IV abx. Monitor I and O's. Admit wt done.

## 2018-10-01 NOTE — Progress Notes (Signed)
Received to 6E11 via stretcher from ED. O2 @ 2LNC. Sats 100%. Denies SOB at this time. Oriented to room/unit. Call bell in reach. Pt/family updated with POC. Verbalized understanding.

## 2018-10-02 ENCOUNTER — Other Ambulatory Visit: Payer: Self-pay

## 2018-10-02 ENCOUNTER — Observation Stay (HOSPITAL_COMMUNITY): Payer: Medicaid Other

## 2018-10-02 ENCOUNTER — Encounter (HOSPITAL_COMMUNITY): Payer: Self-pay | Admitting: General Practice

## 2018-10-02 DIAGNOSIS — Z87891 Personal history of nicotine dependence: Secondary | ICD-10-CM | POA: Diagnosis not present

## 2018-10-02 DIAGNOSIS — Z6841 Body Mass Index (BMI) 40.0 and over, adult: Secondary | ICD-10-CM | POA: Diagnosis not present

## 2018-10-02 DIAGNOSIS — Z794 Long term (current) use of insulin: Secondary | ICD-10-CM | POA: Diagnosis not present

## 2018-10-02 DIAGNOSIS — J189 Pneumonia, unspecified organism: Secondary | ICD-10-CM | POA: Diagnosis not present

## 2018-10-02 DIAGNOSIS — I361 Nonrheumatic tricuspid (valve) insufficiency: Secondary | ICD-10-CM

## 2018-10-02 DIAGNOSIS — I081 Rheumatic disorders of both mitral and tricuspid valves: Secondary | ICD-10-CM | POA: Diagnosis present

## 2018-10-02 DIAGNOSIS — Z791 Long term (current) use of non-steroidal anti-inflammatories (NSAID): Secondary | ICD-10-CM | POA: Diagnosis not present

## 2018-10-02 DIAGNOSIS — I110 Hypertensive heart disease with heart failure: Secondary | ICD-10-CM | POA: Diagnosis present

## 2018-10-02 DIAGNOSIS — J159 Unspecified bacterial pneumonia: Secondary | ICD-10-CM | POA: Diagnosis present

## 2018-10-02 DIAGNOSIS — I5033 Acute on chronic diastolic (congestive) heart failure: Secondary | ICD-10-CM | POA: Diagnosis not present

## 2018-10-02 DIAGNOSIS — I4819 Other persistent atrial fibrillation: Secondary | ICD-10-CM | POA: Diagnosis not present

## 2018-10-02 DIAGNOSIS — E876 Hypokalemia: Secondary | ICD-10-CM | POA: Diagnosis present

## 2018-10-02 DIAGNOSIS — Z7901 Long term (current) use of anticoagulants: Secondary | ICD-10-CM | POA: Diagnosis not present

## 2018-10-02 DIAGNOSIS — Z888 Allergy status to other drugs, medicaments and biological substances status: Secondary | ICD-10-CM | POA: Diagnosis not present

## 2018-10-02 DIAGNOSIS — E1165 Type 2 diabetes mellitus with hyperglycemia: Secondary | ICD-10-CM | POA: Diagnosis not present

## 2018-10-02 DIAGNOSIS — Y95 Nosocomial condition: Secondary | ICD-10-CM | POA: Diagnosis present

## 2018-10-02 DIAGNOSIS — Z9114 Patient's other noncompliance with medication regimen: Secondary | ICD-10-CM | POA: Diagnosis not present

## 2018-10-02 DIAGNOSIS — G4733 Obstructive sleep apnea (adult) (pediatric): Secondary | ICD-10-CM | POA: Diagnosis present

## 2018-10-02 DIAGNOSIS — M069 Rheumatoid arthritis, unspecified: Secondary | ICD-10-CM | POA: Diagnosis present

## 2018-10-02 DIAGNOSIS — Z79899 Other long term (current) drug therapy: Secondary | ICD-10-CM | POA: Diagnosis not present

## 2018-10-02 HISTORY — DX: Pneumonia, unspecified organism: J18.9

## 2018-10-02 LAB — COMPREHENSIVE METABOLIC PANEL
ALK PHOS: 70 U/L (ref 38–126)
ALT: 14 U/L (ref 0–44)
AST: 15 U/L (ref 15–41)
Albumin: 3.5 g/dL (ref 3.5–5.0)
Anion gap: 11 (ref 5–15)
BUN: 8 mg/dL (ref 6–20)
CALCIUM: 9.2 mg/dL (ref 8.9–10.3)
CO2: 27 mmol/L (ref 22–32)
CREATININE: 0.85 mg/dL (ref 0.44–1.00)
Chloride: 99 mmol/L (ref 98–111)
Glucose, Bld: 276 mg/dL — ABNORMAL HIGH (ref 70–99)
Potassium: 3.3 mmol/L — ABNORMAL LOW (ref 3.5–5.1)
SODIUM: 137 mmol/L (ref 135–145)
Total Bilirubin: 0.9 mg/dL (ref 0.3–1.2)
Total Protein: 7.1 g/dL (ref 6.5–8.1)

## 2018-10-02 LAB — GLUCOSE, CAPILLARY
GLUCOSE-CAPILLARY: 335 mg/dL — AB (ref 70–99)
GLUCOSE-CAPILLARY: 247 mg/dL — AB (ref 70–99)
GLUCOSE-CAPILLARY: 330 mg/dL — AB (ref 70–99)
Glucose-Capillary: 317 mg/dL — ABNORMAL HIGH (ref 70–99)

## 2018-10-02 LAB — HEMOGLOBIN A1C
Hgb A1c MFr Bld: 8.5 % — ABNORMAL HIGH (ref 4.8–5.6)
MEAN PLASMA GLUCOSE: 197.25 mg/dL

## 2018-10-02 LAB — CBC
HEMATOCRIT: 30.5 % — AB (ref 36.0–46.0)
HEMOGLOBIN: 9.8 g/dL — AB (ref 12.0–15.0)
MCH: 29.5 pg (ref 26.0–34.0)
MCHC: 32.1 g/dL (ref 30.0–36.0)
MCV: 91.9 fL (ref 80.0–100.0)
NRBC: 0.4 % — AB (ref 0.0–0.2)
Platelets: 398 10*3/uL (ref 150–400)
RBC: 3.32 MIL/uL — ABNORMAL LOW (ref 3.87–5.11)
RDW: 14.6 % (ref 11.5–15.5)
WBC: 7 10*3/uL (ref 4.0–10.5)

## 2018-10-02 LAB — ECHOCARDIOGRAM COMPLETE
Height: 62 in
WEIGHTICAEL: 3945.6 [oz_av]

## 2018-10-02 LAB — STREP PNEUMONIAE URINARY ANTIGEN: STREP PNEUMO URINARY ANTIGEN: NEGATIVE

## 2018-10-02 LAB — HIV ANTIBODY (ROUTINE TESTING W REFLEX): HIV Screen 4th Generation wRfx: NONREACTIVE

## 2018-10-02 MED ORDER — FUROSEMIDE 10 MG/ML IJ SOLN
60.0000 mg | Freq: Two times a day (BID) | INTRAMUSCULAR | Status: DC
  Administered 2018-10-02 – 2018-10-03 (×2): 60 mg via INTRAVENOUS
  Filled 2018-10-02 (×2): qty 6

## 2018-10-02 MED ORDER — INSULIN ASPART PROT & ASPART (70-30 MIX) 100 UNIT/ML ~~LOC~~ SUSP
30.0000 [IU] | Freq: Two times a day (BID) | SUBCUTANEOUS | Status: DC
  Administered 2018-10-02: 30 [IU] via SUBCUTANEOUS
  Filled 2018-10-02: qty 10

## 2018-10-02 MED ORDER — INSULIN ASPART PROT & ASPART (70-30 MIX) 100 UNIT/ML ~~LOC~~ SUSP
40.0000 [IU] | Freq: Two times a day (BID) | SUBCUTANEOUS | Status: DC
  Administered 2018-10-02 – 2018-10-03 (×2): 40 [IU] via SUBCUTANEOUS
  Filled 2018-10-02: qty 10

## 2018-10-02 MED ORDER — POTASSIUM CHLORIDE CRYS ER 20 MEQ PO TBCR
40.0000 meq | EXTENDED_RELEASE_TABLET | Freq: Every day | ORAL | Status: DC
  Administered 2018-10-02 – 2018-10-03 (×2): 40 meq via ORAL
  Filled 2018-10-02 (×2): qty 2

## 2018-10-02 MED ORDER — PREDNISONE 5 MG PO TABS
15.0000 mg | ORAL_TABLET | Freq: Every day | ORAL | Status: DC
  Administered 2018-10-02 – 2018-10-03 (×2): 15 mg via ORAL
  Filled 2018-10-02 (×2): qty 1

## 2018-10-02 NOTE — Evaluation (Addendum)
Physical Therapy Evaluation Patient Details Name: Kathleen Kane MRN: [id number] DOB: 1961/07/07 Today's Date: 10/02/2018   History of Present Illness  Patient is a 57 y/o female who presents with SOB, cough, and chest pain. CTA- multilobar PNA with pulmonary edema from CHF. PMH includes RA, HTN, CHF, Afib, anxiety, depression.  Clinical Impression  Patient presents with pain secondary to RA, generalized weakness, dyspnea on exertion, impaired balance and impaired mobility s/p above. Tolerated standing and gait training with Min A for balance/safety. Pt is a furniture walker at home PTA and uses w/c for longer distances. Has support from family 24/7. Teaches an IT class. Reports some issues with cognition recently since symptoms of all this began. Sp02 dropped to 88% on RA post short walk. Cues for pursed lip breathing to return SP02 >90%. Pt with HR up to 139 bpm in A-fib during session. Encouraged ambulation with nursing while in the hospital. Will follow acutely to maximize independence and mobility prior to return home.    Follow Up Recommendations Home health PT;Supervision for mobility/OOB    Equipment Recommendations  Other (comment)(TBA)    Recommendations for Other Services       Precautions / Restrictions Precautions Precautions: Fall Precaution Comments: hx of falls; RA throughout joints Restrictions Weight Bearing Restrictions: No      Mobility  Bed Mobility Overal bed mobility: Needs Assistance Bed Mobility: Supine to Sit     Supine to sit: Min assist;HOB elevated     General bed mobility comments: Assist to elevate trunk to get to EOB.   Transfers Overall transfer level: Needs assistance Equipment used: 1 person hand held assist Transfers: Sit to/from Stand Sit to Stand: Min assist         General transfer comment: Assist to power to standing with cues for hand placement. Min A to steady in standing.  Ambulation/Gait Ambulation/Gait assistance: Min  assist Gait Distance (Feet): 30 Feet Assistive device: 1 person hand held assist;2 person hand held assist Gait Pattern/deviations: Step-through pattern;Decreased stride length;Wide base of support Gait velocity: decreased   General Gait Details: Slow, unsteady gait with bil knee instability with 1 instance of almost knee buckling. 2/4 DOE. Sp02 dropped to 88% on RA. HHA x2 progressing to 1 HHA and furniture walking.  Stairs            Wheelchair Mobility    Modified Rankin (Stroke Patients Only)       Balance Overall balance assessment: Needs assistance Sitting-balance support: Feet supported;No upper extremity supported Sitting balance-Leahy Scale: Good     Standing balance support: During functional activity;Single extremity supported Standing balance-Leahy Scale: Poor Standing balance comment: Requires at least 1 UE support for standing due bad RA in Bil knees.                             Pertinent Vitals/Pain Pain Assessment: Faces Faces Pain Scale: Hurts little more Pain Location: joints- hands, knees Pain Descriptors / Indicators: Sore;Aching;Guarding Pain Intervention(s): Monitored during session;Repositioned    Home Living Family/patient expects to be discharged to:: Private residence Living Arrangements: Spouse/significant other;Children Available Help at Discharge: Family;Available 24 hours/day Type of Home: House Home Access: Stairs to enter Entrance Stairs-Rails: None(holds onto house) Entrance Stairs-Number of Steps: 3 small steps Home Layout: One level Home Equipment: Wheelchair - manual;Bedside commode;Shower seat      Prior Function Level of Independence: Needs assistance   Gait / Transfers Assistance Needed: Furniture walker for household  distances; uses w/c for community.   ADL's / Homemaking Assistance Needed: assist getting in/out of tub, dresses self.   Comments: Teaches IT computer class; reports stumbling but  furniture/walls catch her     Hand Dominance        Extremity/Trunk Assessment   Upper Extremity Assessment Upper Extremity Assessment: RUE deficits/detail;LUE deficits/detail RUE Sensation: decreased light touch LUE Sensation: decreased light touch    Lower Extremity Assessment Lower Extremity Assessment: LLE deficits/detail;RLE deficits/detail RLE Sensation: decreased light touch LLE Sensation: decreased light touch       Communication   Communication: No difficulties  Cognition Arousal/Alertness: Awake/alert Behavior During Therapy: WFL for tasks assessed/performed Overall Cognitive Status: Within Functional Limits for tasks assessed                                 General Comments: for basic mobility tasks; per daughter and pt, having memory issues at home and forgetting mid conversation what she wanted to say whcih is new as of 2-3 weeks ago when all these symptoms started      General Comments General comments (skin integrity, edema, etc.): Spouse and daughter present during session.    Exercises     Assessment/Plan    PT Assessment Patient needs continued PT services  PT Problem List Decreased strength;Decreased mobility;Pain;Decreased balance;Impaired sensation;Decreased activity tolerance;Cardiopulmonary status limiting activity;Obesity       PT Treatment Interventions Functional mobility training;Balance training;Patient/family education;Gait training;Therapeutic activities;Therapeutic exercise;Stair training    PT Goals (Current goals can be found in the Care Plan section)  Acute Rehab PT Goals Patient Stated Goal: to get better PT Goal Formulation: With patient/family Time For Goal Achievement: 10/16/18 Potential to Achieve Goals: Good    Frequency Min 3X/week   Barriers to discharge Inaccessible home environment stairs    Co-evaluation               AM-PAC PT "6 Clicks" Mobility  Outcome Measure Help needed turning from  your back to your side while in a flat bed without using bedrails?: A Little Help needed moving from lying on your back to sitting on the side of a flat bed without using bedrails?: A Little Help needed moving to and from a bed to a chair (including a wheelchair)?: A Little Help needed standing up from a chair using your arms (e.g., wheelchair or bedside chair)?: A Little Help needed to walk in hospital room?: A Little Help needed climbing 3-5 steps with a railing? : A Lot 6 Click Score: 17    End of Session   Activity Tolerance: Treatment limited secondary to medical complications (Comment)(drop in Sp02) Patient left: in bed;with call bell/phone within reach;with nursing/sitter in room;with family/visitor present Nurse Communication: Mobility status PT Visit Diagnosis: Muscle weakness (generalized) (M62.81);Unsteadiness on feet (R26.81);Difficulty in walking, not elsewhere classified (R26.2);Pain Pain - Right/Left: (bil) Pain - part of body: Knee;Hand    Time: 7517-0017 PT Time Calculation (min) (ACUTE ONLY): 20 min   Charges:   PT Evaluation $PT Eval Moderate Complexity: 1 Mod          Wray Kearns, PT, DPT Acute Rehabilitation Services Pager [phone] Office [place] 10/02/2018, 12:04 PM

## 2018-10-02 NOTE — Progress Notes (Signed)
Nutrition Education Note  RD consulted for nutrition education regarding a Heart Healthy, CHO modified diet. Spoke with patient and daughter in room with patient. Both asked appropriate questions and were very engaged.   Lipid Panel     Component Value Date/Time   CHOL  01/24/2008 0330    155        ATP III CLASSIFICATION:  <200     mg/dL   Desirable  200-239  mg/dL   Borderline High  >=240    mg/dL   High   TRIG 141 01/24/2008 0330   HDL 30 (L) 01/24/2008 0330   CHOLHDL 5.2 01/24/2008 0330   VLDL 28 01/24/2008 0330   LDLCALC  01/24/2008 0330    97        Total Cholesterol/HDL:CHD Risk Coronary Heart Disease Risk Table                     Men   Women  1/2 Average Risk   3.4   3.3     Lab Results  Component Value Date   HGBA1C 8.5 (H) 10/02/2018    RD provided "Heart Healthy Consistent Carbohydrate Nutrition Therapy" handout from the Academy of Nutrition and Dietetics. Reviewed patient's dietary recall. Provided examples on ways to decrease sodium and fat intake in diet. Discouraged intake of processed foods and use of salt shaker. Encouraged fresh fruits and vegetables as well as whole grain sources of carbohydrates to maximize fiber intake.   Discussed different food groups and their effects on blood sugar, emphasizing carbohydrate-containing foods. Discussed importance of controlled and consistent carbohydrate intake throughout the day. Teach back method used.  Expect good compliance.  Body mass index is 45.1 kg/m. Pt meets criteria for class 3, extreme/morbid obesity based on current BMI.  Current diet order is heart healthy CHO modified, patient is consuming approximately 100% of meals at this time. Labs and medications reviewed. No further nutrition interventions warranted at this time. RD contact information provided. If additional nutrition issues arise, please re-consult RD.  Molli Barrows, RD, LDN, [place] Pager [phone] After Hours Pager [phone]

## 2018-10-02 NOTE — Progress Notes (Addendum)
Patient ID: Kathleen Kane, female   DOB: 1961/09/28, 57 y.o.   MRN: [id number]  PROGRESS NOTE    Kathleen Kane  CBJ:[id number] DOB: August 24, 1961 DOA: 10/01/2018 PCP: [name], MD   Brief Narrative:  57 y.o. female with medical history significant of chronic diastolic heart failure, persistent atrial fibrillation not on Eliquis for the last many weeks since he ran out of her Eliquis, diabetes mellitus type 2, hypertension, rheumatoid arthritis, morbid obesity presented with shortness of breath and cough for the last 4 days.  CT chest showed multilobar pneumonia.  She was started on IV antibiotics and Lasix.  Assessment & Plan:   Principal Problem:   Community acquired pneumonia Active Problems:   Atrial fibrillation ([place])   Diabetes mellitus ([place])   Essential hypertension   Morbid obesity ([place])   Acute on chronic diastolic CHF (congestive heart failure) ([place])  Multilobar bacterial community-acquired pneumonia -CT of the chest indicates probable multilobar pneumonia -Continue Rocephin and Zithromax.  Urine streptococcal antigen is negative.  Urine Legionella antigen is pending.   Follow cultures. Rapid influenza A and B negative.  -Currently requiring oxygen via nasal cannula 2 L/min.  Wean off as able.  Patient still weeks and deconditioned and requiring oxygen, will need 1 more day in the hospital with IV antibiotics and diuretics. -Incentive spirometry  Probable acute on chronic diastolic CHF -Strict input and output.  Daily weights.    Increase Lasix to 60 mill grams IV every 12 hours.  2D echo pending.  Continue losartan and metoprolol -Outpatient follow-up with cardiology  Diabetes mellitus type 2 uncontrolled with hyperglycemia -Increase 70/30 insulin to 30 units twice a day along with Accu-Cheks with insulin sliding scale coverage.  Follow Hemoglobin A1c.  Hold oral home meds -Diabetes coordinator consult -Patient has not been taking insulin for the last few months at  home.  Advised about compliance.  Hypertension -Monitor blood pressure.  Continue metoprolol, losartan, Lasix  Persistent A. fib -Intermittently tachycardic.  Patient has been noncompliant with Eliquis for the last few weeks -Continue metoprolol and Eliquis.  Hypokalemia -Replace.  Repeat a.m. labs  Morbid obesity -Outpatient follow-up  Rheumatoid arthritis -Patient feels swollen in her fingers with pain and is requesting prednisone.  We will put the patient on 15 mg prednisone daily. Outpatient follow-up with rheumatology    DVT prophylaxis: Eliquis Code Status: Full Family Communication: Husband daughter at bedside Disposition Plan: Home tomorrow if clinically improved  Consultants: None  Procedures: None  Antimicrobials: Rocephin and Zithromax since 10/01/2018   Subjective: Patient seen and examined at bedside.  She complains of swelling and pain in her fingers.  No overnight fever, nausea or vomiting.  Still complains of shortness of breath and cough. Objective: Vitals:   10/01/18 1834 10/01/18 2020 10/02/18 0423 10/02/18 0821  BP: (!) 151/87 (!) 153/106 (!) 156/70 128/79  Pulse: 89 92 (!) 113 99  Resp:  (!) 23 (!) 22   Temp: 98.7 F (37.1 C) 99.1 F (37.3 C) 98.9 F (37.2 C)   TempSrc: Oral Oral Oral   SpO2: 99% 100% 98%   Weight:   111.9 kg   Height:        Intake/Output Summary (Last 24 hours) at 10/02/2018 1044 Last data filed at 10/02/2018 0817 Gross per 24 hour  Intake 220 ml  Output 550 ml  Net -330 ml   Filed Weights   10/01/18 1220 10/02/18 0423  Weight: 113.4 kg 111.9 kg    Examination:  General exam: Appears  calm and comfortable, no distress Respiratory system: Bilateral decreased breath sounds at bases with some scattered crackles Cardiovascular system: S1 & S2 heard, Rate controlled Gastrointestinal system: Abdomen is morbidly obese, nondistended, soft and nontender. Normal bowel sounds heard. Extremities: No cyanosis,  clubbing, 1+ edema   Data Reviewed: I have personally reviewed following labs and imaging studies  CBC: Recent Labs  Lab 10/01/18 1429 10/02/18 0354  WBC 7.9 7.0  HGB 9.9* 9.8*  HCT 30.1* 30.5*  MCV 91.8 91.9  PLT 341 099   Basic Metabolic Panel: Recent Labs  Lab 10/01/18 1429 10/02/18 0354  NA 135 137  K 2.8* 3.3*  CL 96* 99  CO2 24 27  GLUCOSE 367* 276*  BUN 5* 8  CREATININE 0.73 0.85  CALCIUM 9.0 9.2   GFR: Estimated Creatinine Clearance: 84.2 mL/min (by C-G formula based on SCr of 0.85 mg/dL). Liver Function Tests: Recent Labs  Lab 10/02/18 0354  AST 15  ALT 14  ALKPHOS 70  BILITOT 0.9  PROT 7.1  ALBUMIN 3.5   No results for input(s): LIPASE, AMYLASE in the last 168 hours. No results for input(s): AMMONIA in the last 168 hours. Coagulation Profile: No results for input(s): INR, PROTIME in the last 168 hours. Cardiac Enzymes: No results for input(s): CKTOTAL, CKMB, CKMBINDEX, TROPONINI in the last 168 hours. BNP (last 3 results) No results for input(s): PROBNP in the last 8760 hours. HbA1C: Recent Labs    10/02/18 0354  HGBA1C 8.5*   CBG: Recent Labs  Lab 10/01/18 1835 10/01/18 2201 10/02/18 0811  GLUCAP 376* 317* 335*   Lipid Profile: No results for input(s): CHOL, HDL, LDLCALC, TRIG, CHOLHDL, LDLDIRECT in the last 72 hours. Thyroid Function Tests: No results for input(s): TSH, T4TOTAL, FREET4, T3FREE, THYROIDAB in the last 72 hours. Anemia Panel: No results for input(s): VITAMINB12, FOLATE, FERRITIN, TIBC, IRON, RETICCTPCT in the last 72 hours. Sepsis Labs: No results for input(s): PROCALCITON, LATICACIDVEN in the last 168 hours.  Recent Results (from the past 240 hour(s))  Culture, blood (routine x 2)     Status: None (Preliminary result)   Collection Time: 10/01/18  6:49 PM  Result Value Ref Range Status   Specimen Description BLOOD BLOOD LEFT ARM  Final   Special Requests   Final    BOTTLES DRAWN AEROBIC AND ANAEROBIC Blood  Culture adequate volume   Culture   Final    NO GROWTH < 12 HOURS Performed at [facility], [address], [place], [place] [address]    Report Status PENDING  Incomplete  Culture, blood (routine x 2)     Status: None (Preliminary result)   Collection Time: 10/01/18  6:49 PM  Result Value Ref Range Status   Specimen Description BLOOD BLOOD RIGHT ARM  Final   Special Requests   Final    BOTTLES DRAWN AEROBIC AND ANAEROBIC Blood Culture adequate volume   Culture   Final    NO GROWTH < 12 HOURS Performed at [facility], [place] [address] [place], [place] [address]    Report Status PENDING  Incomplete         Radiology Studies: Dg Chest 2 View  Result Date: 10/01/2018 CLINICAL DATA:  Acute shortness of breath and chest pain for 4 days. EXAM: CHEST - 2 VIEW COMPARISON:  03/07/2017 and prior radiograph FINDINGS: Cardiomegaly with pulmonary vascular congestion noted. Equivocal LEFT UPPER lung opacity/airspace disease noted. There is no evidence of pulmonary edema, pleural effusion, or pneumothorax. No acute  bony abnormalities are identified. IMPRESSION: 1. Cardiomegaly with pulmonary vascular congestion. 2. Equivocal LEFT UPPER lung opacity which may represent airspace disease/pneumonia. Electronically Signed   By: Margarette Canada M.D.   On: 10/01/2018 13:45   Ct Angio Chest Pe W And/or Wo Contrast  Result Date: 10/01/2018 CLINICAL DATA:  56 year old female with history of chest pain and shortness of breath for the past 4 days. Right-sided leg pain. EXAM: CT ANGIOGRAPHY CHEST WITH CONTRAST TECHNIQUE: Multidetector CT imaging of the chest was performed using the standard protocol during bolus administration of intravenous contrast. Multiplanar CT image reconstructions and MIPs were obtained to evaluate the vascular anatomy. CONTRAST:  156mL ISOVUE-370 IOPAMIDOL (ISOVUE-370) INJECTION 76% COMPARISON:  None. FINDINGS: Cardiovascular: No filling defects within the pulmonary arterial  tree to suggest underlying pulmonary embolism. Heart size is mildly enlarged. There is no significant pericardial fluid, thickening or pericardial calcification. There is aortic atherosclerosis, as well as atherosclerosis of the great vessels of the mediastinum and the coronary arteries, including calcified atherosclerotic plaque in the left main, left anterior descending, left circumflex and right coronary arteries. 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 Mediastinum/Nodes: Numerous prominent borderline enlarged and mildly enlarged mediastinal and bilateral hilar lymph nodes are noted, measuring up to 1.7 cm in short axis in the subcarinal nodal station. Esophagus is unremarkable in appearance. Several prominent borderline enlarged and mildly enlarged bilateral axillary lymph nodes are noted measuring up to 1.1 cm in the right axillary region (axial image 42 of series 7). Lungs/Pleura: Patchy multifocal ground-glass attenuation and consolidative airspace disease in a peribronchovascular distribution scattered throughout all aspects of the lungs bilaterally. No pleural effusions. Upper Abdomen: Diffuse low-attenuation throughout the hepatic parenchyma, indicative of hepatic steatosis. Musculoskeletal: There are no aggressive appearing lytic or blastic lesions noted in the visualized portions of the skeleton. Review of the MIP images confirms the above findings. IMPRESSION: 1. No evidence of pulmonary embolism. 2. The appearance of the lungs is highly concerning for severe multilobar bronchopneumonia. Some component of underlying pulmonary edema from congestive heart failure is also possible, but not strongly favored. 3. Cardiomegaly. 4. Aortic atherosclerosis, in addition to left main and 3 vessel coronary artery disease. Please note that although the presence of coronary artery calcium documents the presence of coronary artery disease, the  severity of this disease and any potential stenosis cannot be assessed on this non-gated CT examination. Assessment for potential risk factor modification, dietary therapy or pharmacologic therapy may be warranted, if clinically indicated. Aortic Atherosclerosis (ICD10-I70.0). Electronically Signed   By: Vinnie Langton M.D.   On: 10/01/2018 16:32        Scheduled Meds: . apixaban  5 mg Oral BID  . azithromycin  500 mg Oral Q24H  . diclofenac sodium  2 g Topical QID  . folic acid  1 mg Oral Daily  . furosemide  40 mg Intravenous Q12H  . insulin aspart  0-20 Units Subcutaneous TID WC  . insulin aspart  0-5 Units Subcutaneous QHS  . insulin aspart protamine- aspart  30 Units Subcutaneous BID WC  . losartan  25 mg Oral Daily  . meloxicam  15 mg Oral Daily  . metoprolol tartrate  12.5 mg Oral BID  . potassium chloride SA  40 mEq Oral Daily   Continuous Infusions: . cefTRIAXone (ROCEPHIN)  IV       LOS: 0 days        Aline August, MD [facility] Pager [phone]  If 7PM-7AM, please contact night-coverage [email or web address] Password [facility] 10/02/2018, 10:44 AM

## 2018-10-02 NOTE — Progress Notes (Signed)
  Echocardiogram 2D Echocardiogram has been performed.  Jennette Dubin 10/02/2018, 3:53 PM

## 2018-10-03 ENCOUNTER — Other Ambulatory Visit: Payer: Self-pay | Admitting: Cardiovascular Disease

## 2018-10-03 LAB — BASIC METABOLIC PANEL
ANION GAP: 10 (ref 5–15)
BUN: 12 mg/dL (ref 6–20)
CHLORIDE: 100 mmol/L (ref 98–111)
CO2: 28 mmol/L (ref 22–32)
CREATININE: 0.81 mg/dL (ref 0.44–1.00)
Calcium: 9.1 mg/dL (ref 8.9–10.3)
GFR calc non Af Amer: >60 mL/min (ref >60)
Glucose, Bld: 139 mg/dL — ABNORMAL HIGH (ref 70–99)
POTASSIUM: 3.5 mmol/L (ref 3.5–5.1)
SODIUM: 138 mmol/L (ref 135–145)

## 2018-10-03 LAB — CBC WITH DIFFERENTIAL/PLATELET
Abs Immature Granulocytes: 0.03 10*3/uL (ref 0.00–0.07)
BASOS ABS: 0 10*3/uL (ref 0.0–0.1)
Basophils Relative: 0 %
EOS PCT: 2 %
Eosinophils Absolute: 0.2 10*3/uL (ref 0.0–0.5)
HCT: 29 % — ABNORMAL LOW (ref 36.0–46.0)
HEMOGLOBIN: 9.3 g/dL — AB (ref 12.0–15.0)
Immature Granulocytes: 0 %
LYMPHS PCT: 24 %
Lymphs Abs: 1.8 10*3/uL (ref 0.7–4.0)
MCH: 30.3 pg (ref 26.0–34.0)
MCHC: 32.1 g/dL (ref 30.0–36.0)
MCV: 94.5 fL (ref 80.0–100.0)
Monocytes Absolute: 0.8 10*3/uL (ref 0.1–1.0)
Monocytes Relative: 10 %
NEUTROS PCT: 64 %
NRBC: 0.3 % — AB (ref 0.0–0.2)
Neutro Abs: 4.8 10*3/uL (ref 1.7–7.7)
Platelets: 351 10*3/uL (ref 150–400)
RBC: 3.07 MIL/uL — ABNORMAL LOW (ref 3.87–5.11)
RDW: 14.8 % (ref 11.5–15.5)
WBC: 7.6 10*3/uL (ref 4.0–10.5)

## 2018-10-03 LAB — LEGIONELLA PNEUMOPHILA SEROGP 1 UR AG: L. pneumophila Serogp 1 Ur Ag: NEGATIVE

## 2018-10-03 LAB — MAGNESIUM: Magnesium: 1.7 mg/dL (ref 1.7–2.4)

## 2018-10-03 LAB — GLUCOSE, CAPILLARY
GLUCOSE-CAPILLARY: 152 mg/dL — AB (ref 70–99)
Glucose-Capillary: 160 mg/dL — ABNORMAL HIGH (ref 70–99)

## 2018-10-03 MED ORDER — LOSARTAN POTASSIUM 25 MG PO TABS: 25.0000 mg | ORAL_TABLET | Freq: Every day | ORAL | 0 refills | Status: DC

## 2018-10-03 MED ORDER — METFORMIN HCL 500 MG PO TABS: 1000.0000 mg | ORAL_TABLET | Freq: Two times a day (BID) | ORAL | 0 refills | Status: AC

## 2018-10-03 MED ORDER — LEVOFLOXACIN 750 MG PO TABS: 750.0000 mg | ORAL_TABLET | Freq: Every day | ORAL | 0 refills | Status: AC

## 2018-10-03 MED ORDER — METOPROLOL TARTRATE 25 MG PO TABS: 12.5000 mg | ORAL_TABLET | Freq: Two times a day (BID) | ORAL | 0 refills | Status: DC

## 2018-10-03 MED ORDER — FUROSEMIDE 20 MG PO TABS: 40.0000 mg | ORAL_TABLET | Freq: Two times a day (BID) | ORAL | 0 refills | Status: DC

## 2018-10-03 MED ORDER — TRAMADOL HCL 50 MG PO TABS: 50.0000 mg | ORAL_TABLET | Freq: Four times a day (QID) | ORAL | 0 refills | Status: DC | PRN

## 2018-10-03 MED ORDER — ONDANSETRON HCL 4 MG PO TABS: 4.0000 mg | ORAL_TABLET | Freq: Four times a day (QID) | ORAL | 0 refills | Status: DC | PRN

## 2018-10-03 MED ORDER — POTASSIUM CHLORIDE CRYS ER 20 MEQ PO TBCR: 40.0000 meq | EXTENDED_RELEASE_TABLET | Freq: Every day | ORAL | 0 refills | Status: DC

## 2018-10-03 MED ORDER — BLOOD GLUCOSE METER KIT: PACK | 0 refills | Status: DC

## 2018-10-03 MED ORDER — NOVOLOG MIX 70/30 FLEXPEN (70-30) 100 UNIT/ML ~~LOC~~ SUPN: 40.0000 [IU] | PEN_INJECTOR | Freq: Two times a day (BID) | SUBCUTANEOUS | 0 refills | Status: AC

## 2018-10-03 MED ORDER — PREDNISONE 5 MG PO TABS: 15.0000 mg | ORAL_TABLET | Freq: Every day | ORAL | 0 refills | Status: DC

## 2018-10-03 MED ORDER — MELOXICAM 15 MG PO TABS: 15.0000 mg | ORAL_TABLET | Freq: Every day | ORAL | 0 refills | Status: DC

## 2018-10-03 MED ORDER — APIXABAN 5 MG PO TABS: 5.0000 mg | ORAL_TABLET | Freq: Two times a day (BID) | ORAL | 0 refills | Status: DC

## 2018-10-03 MED ORDER — PANTOPRAZOLE SODIUM 40 MG PO TBEC: 40.0000 mg | DELAYED_RELEASE_TABLET | Freq: Every day | ORAL | 0 refills | Status: DC

## 2018-10-03 NOTE — Progress Notes (Signed)
Patient ID: KORINNA TAT, female   DOB: 10-16-1961, 57 y.o.   MRN: [id number]    Date:  10/09/2018   ID:  Kathleen Kane, DOB Jan 14, 1961, MRN [id number]   PCP:  [name], MD  Primary Cardiologist:  Johnsie Cancel   Chief complaint: Heart failure follow-up   History of Present Illness: Kathleen Kane is a 57 y.o. female with a hx of chronic A. fib, diastolic HF, OSA (does not use CPAP), HTN, DM, RA, obesity and previous tobacco abuse who presents post hospital D/c on 10/03/18 Rx community aquired pneumonia, non compliance with meds And diastolic CHF TTE done 27.03.50 EF 50-55% stable moderate MR/TR. No history of CAD Normal myovue in 2014 D/C on Levaquin And lasix 40 bid   She is no longer in a wheel chair  rheumatoid and gout. Trying to get off prednisone on Humera, Plaquenil and Methotrexate Sees Dr Eliberto Ivory  Does poorly when prednisone dropped to less than 15 mg / day  Has poor insight into diet Needs f/u CXR    Wt Readings from Last 3 Encounters:  10/09/18 248 lb 9.6 oz (112.8 kg)  10/03/18 246 lb 6.4 oz (111.8 kg)  04/14/18 251 lb (113.9 kg)     Past Medical History:  Diagnosis Date  . Anxiety   . Atrial fibrillation, persistent   . CHF (congestive heart failure) ([place]) 2013   No echo found from that time, most likely diastolic  . Depression   . Diabetes mellitus without complication ([place])   . Hypertension   . Obesity   . Obstructive sleep apnea    Sleep study performed 10/18/2008. AHI-6.8/hr, during REM-27.3/hr. RDI-25.0/hr, during REM-40.9/hr. avg o2 sat during REM and NREM 95%  . Pneumonia 10/02/2018  . Rheumatoid arthritis ([place])     Current Outpatient Medications  Medication Sig Dispense Refill  . apixaban (ELIQUIS) 5 MG TABS tablet Take 1 tablet (5 mg total) by mouth 2 (two) times daily. 60 tablet 0  . blood glucose meter kit and supplies Dispense based on patient and insurance preference. Use up to four times daily as directed. (FOR ICD-10 E10.9, E11.9). 1 each 0    . diclofenac sodium (VOLTAREN) 1 % GEL Apply 2 g topically 4 (four) times daily. 1 Tube 0  . folic acid (FOLVITE) 1 MG tablet Take 1 mg by mouth daily.  3  . furosemide (LASIX) 20 MG tablet Take 2 tablets (40 mg total) by mouth 2 (two) times daily. 60 tablet 0  . inFLIXimab (REMICADE IV) Inject into the vein every 8 (eight) weeks. Administered by Dr. Rogers Blocker, [place] Allegra - next injection due 10/16/18    . losartan (COZAAR) 25 MG tablet Take 1 tablet (25 mg total) by mouth daily. 30 tablet 0  . meloxicam (MOBIC) 15 MG tablet Take 1 tablet (15 mg total) by mouth daily. 10 tablet 0  . metFORMIN (GLUCOPHAGE) 500 MG tablet Take 2 tablets (1,000 mg total) by mouth 2 (two) times daily. 60 tablet 0  . methotrexate (RHEUMATREX) 2.5 MG tablet Take 10 mg by mouth every Sunday. Caution:Chemotherapy. Protect from light.     . metoprolol tartrate (LOPRESSOR) 25 MG tablet Take 0.5 tablets (12.5 mg total) by mouth 2 (two) times daily. Please make appt with Dr. Johnsie Cancel for December for future refills. 1st attempt 30 tablet 0  . NOVOLOG MIX 70/30 FLEXPEN (70-30) 100 UNIT/ML FlexPen Inject 0.4 mLs (40 Units total) into the skin 2 (two) times daily with a meal. 15 mL 0  .  ondansetron (ZOFRAN) 4 MG tablet Take 1 tablet (4 mg total) by mouth every 6 (six) hours as needed for nausea. 20 tablet 0  . pantoprazole (PROTONIX) 40 MG tablet Take 1 tablet (40 mg total) by mouth daily for 15 days. 15 tablet 0  . potassium chloride SA (KLOR-CON M20) 20 MEQ tablet Take 2 tablets (40 mEq total) by mouth daily. 30 tablet 0  . predniSONE (DELTASONE) 5 MG tablet Take 3 tablets (15 mg total) by mouth daily with breakfast. 15 mg daily for a week then reevaluate by PCP 30 tablet 0  . traMADol (ULTRAM) 50 MG tablet Take 1 tablet (50 mg total) by mouth every 6 (six) hours as needed for moderate pain. 20 tablet 0   No current facility-administered medications for this visit.     Allergies:    Allergies  Allergen Reactions  . Ace  Inhibitors Cough  . Lisinopril Cough    Social History:  The patient  reports that she quit smoking about 4 years ago. Her smoking use included cigarettes. She has a 18.00 pack-year smoking history. She has never used smokeless tobacco. She reports that she does not drink alcohol or use drugs.   Family history:   Family History  Problem Relation Age of Onset  . Hypertension Mother   . Diabetes Mother   . Hypertension Father   . Diabetes Other   . Diabetes Paternal Grandmother   . Diabetes Sister   . Heart Problems Sister   . Kidney failure Sister   . Hypertension Brother   . Diabetes Brother     ROS:  Please see the history of present illness.  All other systems reviewed and negative.   PHYSICAL EXAM: VS:  BP 122/74   Pulse 66   Ht '5\' 2"'  (1.575 m)   Wt 248 lb 9.6 oz (112.8 kg)   LMP 10/28/2013 Comment: perimenopausal  SpO2 96%   BMI 45.47 kg/m  Affect appropriate Cushingoid Female Black  HEENT: normal Neck supple with no adenopathy JVP normal no bruits no thyromegaly Lungs clear with no wheezing and good diaphragmatic motion Heart:  S1/S2 no murmur, no rub, gallop or click PMI normal Abdomen: benighn, BS positve, no tenderness, no AAA no bruit.  No HSM or HJR Distal pulses intact with no bruits Plus one bilateral  edema Neuro non-focal Skin warm and dry No muscular weakness   ECG:  01/16/16  afib rate 81 nonspecific ST changes   ASSESSMENT AND PLAN:  Problem List Items Addressed This Visit    Atrial fibrillation ([place])   Chronic diastolic CHF (congestive heart failure) ([place])   Essential hypertension - Primary    Other Visit Diagnoses    Mitral valve insufficiency, unspecified etiology         Acute on chronic diastolic CHF:  Stable told to take extra lasix when prednisone dose goes up F/U Dr Eliberto Ivory for rheumatoidDiscussed better diet and fluid restriction.  She has been more compliant with her lasix  TTE done 10/02/18 EF 50-55% unable to assess diastolic  function   Chronic atrial fibrillation: Continue rate control. Continue Eliquis Recent missed doses Discussed risk of stroke if she is non compliant   Essential hypertension: Controlled on lopressor 12.5 mg BID  MR:  Moderate by echo done 10/02/18 continue to Rx BP and repeat echo in a year   Pulmonary HTN:  Estimated PA pressure by echo 48 mmHg RV mildly dilated Increased from TTE done 11/03/17 when estimated PA pressure was 37 mmHg  Likely related to pneumonia Finish course of antibiotics and continue lasix She also has OSA and is Not using CPAP   DM:  Poorly controlled A1c 8.5 discussed low carb diet and need for insulin compliance    F/U with cardiology PRN primary issue will be compliance with meds, control of DM and weight loss    Jenkins Rouge

## 2018-10-03 NOTE — Care Management Note (Signed)
Case Management Note  Patient Details  Name: Kathleen Kane MRN: [id number] Date of Birth: 09-21-1961  Subjective/Objective: Pt presented for SOB and cough- treating for Pneumonia. PTA from home with support of husband and daughter. PT recommendations for [facility] PT- pt is agreeable to services. Choice offered and patient chose [facility] for DME RW and HH PT Services.                  Action/Plan: CM did make [place] Referral with Linna Hoff and DME to be delivered by Jeneen Rinks to the room prior to transition home. Patient has transportation home. No further needs from CM at this time.  Expected Discharge Date:  10/03/18               Expected Discharge Plan:  [place]  In-House Referral:  NA  Discharge planning Services  CM Consult  Post Acute Care Choice:  NA Choice offered to:  NA  DME Arranged:  [place] rolling DME Agency:  [place]:  PT [facility] Agency:  [place]  Status of Service:  Completed, signed off  If discussed at [place] of Stay Meetings, dates discussed:    Additional Comments:  Bethena Roys, RN 10/03/2018, 10:49 AM

## 2018-10-03 NOTE — Progress Notes (Signed)
Inpatient Diabetes Program Recommendations  AACE/ADA: New Consensus Statement on Inpatient Glycemic Control (2015)  Target Ranges:  Prepandial:   less than 140 mg/dL      Peak postprandial:   less than 180 mg/dL (1-2 hours)      Critically ill patients:  140 - 180 mg/dL   Lab Results  Component Value Date   GLUCAP 160 (H) 10/03/2018   HGBA1C 8.5 (H) 10/02/2018    Review of Glycemic Control Results for FATIM, VANDERSCHAAF (MRN [id number]) as of 10/03/2018 14:16  Ref. Range 10/02/2018 21:17 10/03/2018 07:32 10/03/2018 11:32  Glucose-Capillary Latest Ref Range: 70 - 99 mg/dL 330 (H) 152 (H) 160 (H)   Diabetes history: Type 2 DM Outpatient Diabetes medications: Metformin 1000 mg BID Current orders for Inpatient glycemic control: Novolog 0-20 units TID, Novolog 0-5 units QHS, Novolog 70/30 40 units BID  Inpatient Diabetes Program Recommendations:    Spoke with patient regarding outpatient management of diabetes. Patient has not been taking insulin because of the pain with injections. Has not had a meter and one has been ordered.  Reviewed patient's current A1c of 8.5%. Explained what a A1c is and what it measures. Also reviewed goal A1c with patient, importance of good glucose control @ home, and blood sugar goals. Reviewed patho of DM, need for insulin, role of pancreas, role of 70/30, vascular changes that occur from poor control and comorbidites.  Patient encouraged to begin checking blood glucose 2-3 times per day and to follow up with PCP. Patient is willing. Additionally, discussed the importance of insulin and the impact it has on blood glucose trends, while inpatient. Patient has no further questions at this time.   Thanks, Bronson Curb, MSN, RNC-OB Diabetes Coordinator [phone] (8a-5p)

## 2018-10-03 NOTE — Progress Notes (Signed)
Physical Therapy Treatment Patient Details Name: Kathleen Kane MRN: [id number] DOB: [birth year] Today's Date: 10/03/2018    History of Present Illness Patient is a 57 y/o female who presents with SOB, cough, and chest pain. CTA- multilobar PNA with pulmonary edema from CHF. PMH includes RA, HTN, CHF, Afib, anxiety, depression.    PT Comments    Patient received sitting at EOB with family present, pleasant and willing to participate in PT today. Able to perform functional transfers with min guard and RW, cues for safety and sequencing, as well as gait approximately 121f with RW and close min guard due to knees continuing to almost buckle during gait. SpO2 remained in 90s no lower than 91% on room air with activity, HR in A-fib and fluctuating between 80s-130BPM today. Education provided on safe use of RW, safe parameters for HR/O2 and pursed lip breathing technique, and how to get personal pulse ox for home use at drug store. She was left setting at EOB with all needs met and family present this morning.     Follow Up Recommendations  Home health PT;Supervision for mobility/OOB     Equipment Recommendations  Rolling walker with 5" wheels    Recommendations for Other Services       Precautions / Restrictions Precautions Precautions: Fall Precaution Comments: hx of falls; RA throughout joints Restrictions Weight Bearing Restrictions: No    Mobility  Bed Mobility               General bed mobility comments: sitting at EOB   Transfers Overall transfer level: Needs assistance Equipment used: Rolling walker (2 wheeled) Transfers: Sit to/from Stand Sit to Stand: Min guard         General transfer comment: min guard for safety and steadying, cues for sequencing and safety   Ambulation/Gait Ambulation/Gait assistance: Min guard Gait Distance (Feet): 100 Feet Assistive device: Rolling walker (2 wheeled) Gait Pattern/deviations: Step-through pattern;Decreased stride  length;Wide base of support Gait velocity: decreased   General Gait Details: slow gait and ongoing issues with knees almost buckling, SpO2 remained in 90s and no lower than 91% on room air during gait, HR  in A-fib fluctuating from 80s-130 during gait    Stairs             Wheelchair Mobility    Modified Rankin (Stroke Patients Only)       Balance Overall balance assessment: Needs assistance Sitting-balance support: Feet supported;No upper extremity supported Sitting balance-Leahy Scale: Good     Standing balance support: During functional activity;Single extremity supported Standing balance-Leahy Scale: Poor Standing balance comment: reliance on UE support                             Cognition Arousal/Alertness: Awake/alert Behavior During Therapy: WFL for tasks assessed/performed Overall Cognitive Status: Within Functional Limits for tasks assessed                                 General Comments: for basic mobility tasks; per daughter and pt, having memory issues at home and forgetting mid conversation what she wanted to say whcih is new as of 2-3 weeks ago when all these symptoms started      Exercises      General Comments        Pertinent Vitals/Pain Pain Assessment: No/denies pain Pain Score: 0-No pain Pain Intervention(s): Monitored during session;Repositioned;Limited activity  within patient's tolerance    Home Living                      Prior Function            PT Goals (current goals can now be found in the care plan section) Acute Rehab PT Goals Patient Stated Goal: to get better PT Goal Formulation: With patient/family Time For Goal Achievement: 10/16/18 Potential to Achieve Goals: Good Progress towards PT goals: Progressing toward goals    Frequency    Min 3X/week      PT Plan Current plan remains appropriate    Co-evaluation              AM-PAC PT "6 Clicks" Mobility   Outcome  Measure  Help needed turning from your back to your side while in a flat bed without using bedrails?: A Little Help needed moving from lying on your back to sitting on the side of a flat bed without using bedrails?: A Little Help needed moving to and from a bed to a chair (including a wheelchair)?: A Little Help needed standing up from a chair using your arms (e.g., wheelchair or bedside chair)?: A Little Help needed to walk in hospital room?: A Little Help needed climbing 3-5 steps with a railing? : A Lot 6 Click Score: 17    End of Session Equipment Utilized During Treatment: Gait belt Activity Tolerance: Patient tolerated treatment well Patient left: in bed;with call bell/phone within reach;with family/visitor present(sitting at EOB )   PT Visit Diagnosis: Muscle weakness (generalized) (M62.81);Unsteadiness on feet (R26.81);Difficulty in walking, not elsewhere classified (R26.2);Pain Pain - Right/Left: (bilateral ) Pain - part of body: Knee     Time: 0837-0900 PT Time Calculation (min) (ACUTE ONLY): 23 min  Charges:  $Gait Training: 8-22 mins $Self Care/Home Management: 8-22                     Deniece Ree PT, DPT, CBIS  Supplemental Physical Therapist [place]    Pager [phone] Acute Rehab Office [phone]

## 2018-10-03 NOTE — Progress Notes (Signed)
While adding med details to AVS in preparation for d/c, this RN noticed that pt is prescribed Eliquis and meloxicam but she is not on a PPI. Dr. Starla Link made aware of this  (as well as pt's request for Rx for glucometer) via Amion text page.

## 2018-10-03 NOTE — Discharge Summary (Addendum)
Physician Discharge Summary  Kathleen Kane DGU:[id number] DOB: 17-Oct-1961 DOA: 10/01/2018  PCP: [name], MD  Admit date: 10/01/2018 Discharge date: 10/03/2018  Admitted From: Home Disposition: Home  Recommendations for Outpatient Follow-up:  1. Follow up with PCP in 1 week with repeat CBC/BMP 2. Outpatient follow-up with cardiology and rheumatology 3. Follow-up in the ED if symptoms worsen or new. 4. Comply with medications and follow-up   Home Health: Yes: Home health PT Equipment/Devices: None Discharge Condition: Stable CODE STATUS: Full Diet recommendation: Heart Healthy / Carb Modified /fluid restriction up to 1500 cc a day  Brief/Interim Summary: 57 y.o.femalewith medical history significant ofchronic diastolic heart failure, persistent atrial fibrillation not on Eliquis for the last many weeks since he ran out of her Eliquis, diabetes mellitus type 2, hypertension, rheumatoid arthritis, morbid obesitypresented with shortness of breath and cough for the last 4 days.  CT chest showed multilobar pneumonia.  She was started on IV antibiotics and Lasix..  During the hospitalization, her Condition improved.  Currently she is on room air.  Hemodynamically stable.  She will be discharged on oral antibiotics and oral Lasix.  Discharge Diagnoses:  Principal Problem:   Community acquired pneumonia Active Problems:   Atrial fibrillation ([place])   Diabetes mellitus ([place])   Essential hypertension   Morbid obesity ([place])   Acute on chronic diastolic CHF (congestive heart failure) ([place])   HCAP (healthcare-associated pneumonia)  Multilobar bacterial community-acquired pneumonia -CT of the chest indicates probable multilobar pneumonia -Treated with Rocephin and Zithromax.  Urine streptococcal antigen is negative.  Urine Legionella antigen is negative.   Cultures negative so far. Rapid influenza Aand B negative.  -Currently on room air.  -Feels much better and is okay to be  discharged.  Discharge home on Levaquin 750 mg daily for 5 days.  Probable acute on chronic diastolic CHF -Treated with intravenous Lasix.  Negative balance of 1008 cc since admission.  Counseled the patient about compliance with medications and diet.  Patient is to be on fluid restriction up to 1500 cc a day.  Discharge home on Lasix 40 mg twice a day.  Continue losartan and metoprolol.  -Echo showed EF of 50 to 55%.  There was moderate mitral regurgitation and tricuspid regurgitation.  Outpatient follow-up with cardiology.   Diabetes mellitus type 2 uncontrolled with hyperglycemia -A1c 8.5.  Discharge home on insulin 70/30 40 units twice a day along with metformin.  Unsure if the patient also takes glipizide at home.  Follow-up with PCP.  Might need endocrinology evaluation as an outpatient.   -Counseled the patient about compliance. Patient has not been taking insulin for the last few months at home.    Hypertension -Continue metoprolol, losartan, Lasix  Persistent A. fib -Intermittently tachycardic. Patient has been noncompliant with Eliquis for the last few weeks -Continue metoprolol and Eliquis.  Outpatient follow-up with cardiology  Hypokalemia -Replace. Outpatient follow-up  Morbid obesity -Outpatient follow-up  Rheumatoid arthritis -Patient feels swollen in her fingers with pain and was started on prednisone 15 mg daily.  Continue 15 mg daily prednisone for a week until evaluation by primary care provider.  Outpatient follow-up with rheumatology.   Discharge Instructions  Discharge Instructions    (HEART FAILURE PATIENTS) Call MD:  Anytime you have any of the following symptoms: 1) 3 pound weight gain in 24 hours or 5 pounds in 1 week 2) shortness of breath, with or without a dry hacking cough 3) swelling in the hands, feet or stomach 4) if you  have to sleep on extra pillows at night in order to breathe.   Complete by:  As directed    Ambulatory referral to Cardiology    Complete by:  As directed    CHF exacerbation/Moderate MR   Call MD for:  difficulty breathing, headache or visual disturbances   Complete by:  As directed    Call MD for:  extreme fatigue   Complete by:  As directed    Call MD for:  hives   Complete by:  As directed    Call MD for:  persistant dizziness or light-headedness   Complete by:  As directed    Call MD for:  persistant nausea and vomiting   Complete by:  As directed    Call MD for:  severe uncontrolled pain   Complete by:  As directed    Call MD for:  temperature >100.4   Complete by:  As directed    Diet - low sodium heart healthy   Complete by:  As directed    Diet Carb Modified   Complete by:  As directed    Increase activity slowly   Complete by:  As directed      Allergies as of 10/03/2018      Reactions   Ace Inhibitors Cough   Lisinopril Cough      Medication List    STOP taking these medications   glipiZIDE 5 MG tablet Commonly known as:  GLUCOTROL     TAKE these medications   apixaban 5 MG Tabs tablet Commonly known as:  ELIQUIS Take 1 tablet (5 mg total) by mouth 2 (two) times daily.   blood glucose meter kit and supplies Dispense based on patient and insurance preference. Use up to four times daily as directed. (FOR ICD-10 E10.9, E11.9).   diclofenac sodium 1 % Gel Commonly known as:  VOLTAREN Apply 2 g topically 4 (four) times daily.   folic acid 1 MG tablet Commonly known as:  FOLVITE Take 1 mg by mouth daily.   furosemide 20 MG tablet Commonly known as:  LASIX Take 2 tablets (40 mg total) by mouth 2 (two) times daily. What changed:    how much to take  additional instructions   levofloxacin 750 MG tablet Commonly known as:  LEVAQUIN Take 1 tablet (750 mg total) by mouth daily for 5 days.   losartan 25 MG tablet Commonly known as:  COZAAR Take 1 tablet (25 mg total) by mouth daily. What changed:  additional instructions   meloxicam 15 MG tablet Commonly known as:   MOBIC Take 1 tablet (15 mg total) by mouth daily.   metFORMIN 500 MG tablet Commonly known as:  GLUCOPHAGE Take 2 tablets (1,000 mg total) by mouth 2 (two) times daily.   methotrexate 2.5 MG tablet Commonly known as:  RHEUMATREX Take 10 mg by mouth every Sunday. Caution:Chemotherapy. Protect from light.   metoprolol tartrate 25 MG tablet Commonly known as:  LOPRESSOR Take 0.5 tablets (12.5 mg total) by mouth 2 (two) times daily. Please make appt with Dr. Johnsie Cancel for December for future refills. 1st attempt   NOVOLOG MIX 70/30 FLEXPEN (70-30) 100 UNIT/ML FlexPen Generic drug:  insulin aspart protamine - aspart Inject 0.4 mLs (40 Units total) into the skin 2 (two) times daily with a meal. What changed:    how much to take  additional instructions   ondansetron 4 MG tablet Commonly known as:  ZOFRAN Take 1 tablet (4 mg total) by mouth every 6 (six)  hours as needed for nausea.   pantoprazole 40 MG tablet Commonly known as:  PROTONIX Take 1 tablet (40 mg total) by mouth daily for 15 days.   potassium chloride SA 20 MEQ tablet Commonly known as:  K-DUR,KLOR-CON Take 2 tablets (40 mEq total) by mouth daily. Start taking on:  10/04/2018 What changed:  how much to take   predniSONE 5 MG tablet Commonly known as:  DELTASONE Take 3 tablets (15 mg total) by mouth daily with breakfast. 15 mg daily for a week then reevaluate by PCP Start taking on:  10/04/2018 What changed:    how much to take  when to take this  additional instructions  Another medication with the same name was removed. Continue taking this medication, and follow the directions you see here.   REMICADE IV Inject into the vein every 8 (eight) weeks. Administered by Dr. Rogers Blocker, Rondall Allegra - next injection due 10/16/18   traMADol 50 MG tablet Commonly known as:  ULTRAM Take 1 tablet (50 mg total) by mouth every 6 (six) hours as needed for moderate pain. What changed:  reasons to take this        Follow-up Information    [name], MD. Schedule an appointment as soon as possible for a visit in 1 week(s).   Specialty:  Internal Medicine Why:  With repeat CBC/BMP Contact information: [place] [address] [phone]        Josue Hector, MD. Schedule an appointment as soon as possible for a visit in 1 week(s).   Specialty:  Cardiology Contact information: [address] [address] [phone]        Rheumatologist. Schedule an appointment as soon as possible for a visit in 1 week(s).          Allergies  Allergen Reactions  . Ace Inhibitors Cough  . Lisinopril Cough    Consultations:  None   Procedures/Studies: Dg Chest 2 View  Result Date: 10/01/2018 CLINICAL DATA:  Acute shortness of breath and chest pain for 4 days. EXAM: CHEST - 2 VIEW COMPARISON:  03/07/2017 and prior radiograph FINDINGS: Cardiomegaly with pulmonary vascular congestion noted. Equivocal LEFT UPPER lung opacity/airspace disease noted. There is no evidence of pulmonary edema, pleural effusion, or pneumothorax. No acute bony abnormalities are identified. IMPRESSION: 1. Cardiomegaly with pulmonary vascular congestion. 2. Equivocal LEFT UPPER lung opacity which may represent airspace disease/pneumonia. Electronically Signed   By: Margarette Canada M.D.   On: 10/01/2018 13:45   Ct Angio Chest Pe W And/or Wo Contrast  Result Date: 10/01/2018 CLINICAL DATA:  57 year old female with history of chest pain and shortness of breath for the past 4 days. Right-sided leg pain. EXAM: CT ANGIOGRAPHY CHEST WITH CONTRAST TECHNIQUE: Multidetector CT imaging of the chest was performed using the standard protocol during bolus administration of intravenous contrast. Multiplanar CT image reconstructions and MIPs were obtained to evaluate the vascular anatomy. CONTRAST:  123m ISOVUE-370 IOPAMIDOL (ISOVUE-370) INJECTION 76% COMPARISON:  None. FINDINGS:  Cardiovascular: No filling defects within the pulmonary arterial tree to suggest underlying pulmonary embolism. Heart size is mildly enlarged. There is no significant pericardial fluid, thickening or pericardial calcification. There is aortic atherosclerosis, as well as atherosclerosis of the great vessels of the mediastinum and the coronary arteries, including calcified atherosclerotic plaque in the left main, left anterior descending, left circumflex and right coronary arteries. 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 Mediastinum/Nodes: Numerous prominent  borderline enlarged and mildly enlarged mediastinal and bilateral hilar lymph nodes are noted, measuring up to 1.7 cm in short axis in the subcarinal nodal station. Esophagus is unremarkable in appearance. Several prominent borderline enlarged and mildly enlarged bilateral axillary lymph nodes are noted measuring up to 1.1 cm in the right axillary region (axial image 42 of series 7). Lungs/Pleura: Patchy multifocal ground-glass attenuation and consolidative airspace disease in a peribronchovascular distribution scattered throughout all aspects of the lungs bilaterally. No pleural effusions. Upper Abdomen: Diffuse low-attenuation throughout the hepatic parenchyma, indicative of hepatic steatosis. Musculoskeletal: There are no aggressive appearing lytic or blastic lesions noted in the visualized portions of the skeleton. Review of the MIP images confirms the above findings. IMPRESSION: 1. No evidence of pulmonary embolism. 2. The appearance of the lungs is highly concerning for severe multilobar bronchopneumonia. Some component of underlying pulmonary edema from congestive heart failure is also possible, but not strongly favored. 3. Cardiomegaly. 4. Aortic atherosclerosis, in addition to left main and 3 vessel coronary artery disease. Please note that although the presence of coronary  artery calcium documents the presence of coronary artery disease, the severity of this disease and any potential stenosis cannot be assessed on this non-gated CT examination. Assessment for potential risk factor modification, dietary therapy or pharmacologic therapy may be warranted, if clinically indicated. Aortic Atherosclerosis (ICD10-I70.0). Electronically Signed   By: Vinnie Langton M.D.   On: 10/01/2018 16:32    Echo Study Conclusions  - Left ventricle: The cavity size was normal. Systolic function was   normal. The estimated ejection fraction was in the range of 50%   to 55%. Although no diagnostic regional wall motion abnormality   was identified, this possibility cannot be completely excluded on   the basis of this study. The study is not technically sufficient   to allow evaluation of LV diastolic function. - Aortic valve: Transvalvular velocity was within the normal range.   There was no stenosis. There was no regurgitation. - Mitral valve: Mildly thickened leaflets . There was moderate   regurgitation. - Left atrium: The atrium was moderately dilated. - Right ventricle: The cavity size was mildly dilated. Wall   thickness was normal. Systolic function was low normal. RV   systolic pressure (S, est): 48 mm Hg. - Right atrium: The atrium was normal in size. Central venous   pressure (est): 8 mm Hg. - Tricuspid valve: There was moderate regurgitation. - Pulmonary arteries: Systolic pressure was moderately increased.   PA peak pressure: 48 mm Hg (S). - Inferior vena cava: The vessel was dilated. The respirophasic   diameter changes were in the normal range (= 50%). - Pericardium, extracardiac: There was no pericardial effusion   Subjective: Patient seen and examined at bedside.  She denies any overnight fever, nausea or vomiting.  Her breathing is improving.  She feels okay to go home.  Discharge Exam: Vitals:   10/03/18 0311 10/03/18 0814  BP: 127/77 (!) 112/55   Pulse: 86 90  Resp: 15 16  Temp:    SpO2: 96% 96%   Vitals:   10/02/18 1547 10/02/18 2013 10/03/18 0311 10/03/18 0814  BP: 134/88 (!) 121/51 127/77 (!) 112/55  Pulse: 80 (!) 111 86 90  Resp:  (!) '28 15 16  ' Temp: 98.2 F (36.8 C) 98.3 F (36.8 C)    TempSrc: Oral Oral    SpO2: 100% 99% 96% 96%  Weight:   111.8 kg   Height:        General:  Pt is alert, awake, not in acute distress Cardiovascular: rate controlled, S1/S2 + Respiratory: bilateral decreased breath sounds at bases, with some scattered crackles Abdominal: Soft, morbidly obese, NT, ND, bowel sounds + Extremities: Trace edema, no cyanosis    The results of significant diagnostics from this hospitalization (including imaging, microbiology, ancillary and laboratory) are listed below for reference.     Microbiology: Recent Results (from the past 240 hour(s))  Culture, blood (routine x 2)     Status: None (Preliminary result)   Collection Time: 10/01/18  6:49 PM  Result Value Ref Range Status   Specimen Description BLOOD BLOOD LEFT ARM  Final   Special Requests   Final    BOTTLES DRAWN AEROBIC AND ANAEROBIC Blood Culture adequate volume   Culture   Final    NO GROWTH 2 DAYS Performed at [facility], [address], [place], [place] [address]    Report Status PENDING  Incomplete  Culture, blood (routine x 2)     Status: None (Preliminary result)   Collection Time: 10/01/18  6:49 PM  Result Value Ref Range Status   Specimen Description BLOOD BLOOD RIGHT ARM  Final   Special Requests   Final    BOTTLES DRAWN AEROBIC AND ANAEROBIC Blood Culture adequate volume   Culture   Final    NO GROWTH 2 DAYS Performed at [facility], [place] [address], [place], [place] [address]    Report Status PENDING  Incomplete     Labs: BNP (last 3 results) Recent Labs    10/01/18 1430  BNP 143.8*   Basic Metabolic Panel: Recent Labs  Lab 10/01/18 1429 10/02/18 0354 10/03/18 0444  NA 135 137 138  K 2.8* 3.3*  3.5  CL 96* 99 100  CO2 '24 27 28  ' GLUCOSE 367* 276* 139*  BUN 5* 8 12  CREATININE 0.73 0.85 0.81  CALCIUM 9.0 9.2 9.1  MG  --   --  1.7   Liver Function Tests: Recent Labs  Lab 10/02/18 0354  AST 15  ALT 14  ALKPHOS 70  BILITOT 0.9  PROT 7.1  ALBUMIN 3.5   No results for input(s): LIPASE, AMYLASE in the last 168 hours. No results for input(s): AMMONIA in the last 168 hours. CBC: Recent Labs  Lab 10/01/18 1429 10/02/18 0354 10/03/18 0444  WBC 7.9 7.0 7.6  NEUTROABS  --   --  4.8  HGB 9.9* 9.8* 9.3*  HCT 30.1* 30.5* 29.0*  MCV 91.8 91.9 94.5  PLT 341 398 351   Cardiac Enzymes: No results for input(s): CKTOTAL, CKMB, CKMBINDEX, TROPONINI in the last 168 hours. BNP: Invalid input(s): POCBNP CBG: Recent Labs  Lab 10/02/18 0811 10/02/18 1143 10/02/18 1635 10/02/18 2117 10/03/18 0732  GLUCAP 335* 247* 317* 330* 152*   D-Dimer Recent Labs    10/01/18 1429  DDIMER 2.69*   Hgb A1c Recent Labs    10/02/18 0354  HGBA1C 8.5*   Lipid Profile No results for input(s): CHOL, HDL, LDLCALC, TRIG, CHOLHDL, LDLDIRECT in the last 72 hours. Thyroid function studies No results for input(s): TSH, T4TOTAL, T3FREE, THYROIDAB in the last 72 hours.  Invalid input(s): FREET3 Anemia work up No results for input(s): VITAMINB12, FOLATE, FERRITIN, TIBC, IRON, RETICCTPCT in the last 72 hours. Urinalysis    Component Value Date/Time   COLORURINE STRAW (A) 03/07/2017 1550   APPEARANCEUR CLEAR 03/07/2017 1550   LABSPEC 1.003 (L) 03/07/2017 1550   PHURINE 6.0 03/07/2017 1550   GLUCOSEU 150 (A)  03/07/2017 [place] 03/07/2017 [place] 03/07/2017 [place] 03/07/2017 1550   PROTEINUR NEGATIVE 03/07/2017 1550   UROBILINOGEN 1.0 07/22/2012 1700   NITRITE NEGATIVE 03/07/2017 1550   LEUKOCYTESUR TRACE (A) 03/07/2017 1550   Sepsis Labs Invalid input(s): PROCALCITONIN,  WBC,  LACTICIDVEN Microbiology Recent Results (from the past  240 hour(s))  Culture, blood (routine x 2)     Status: None (Preliminary result)   Collection Time: 10/01/18  6:49 PM  Result Value Ref Range Status   Specimen Description BLOOD BLOOD LEFT ARM  Final   Special Requests   Final    BOTTLES DRAWN AEROBIC AND ANAEROBIC Blood Culture adequate volume   Culture   Final    NO GROWTH 2 DAYS Performed at [facility], [place] [address], [place], [place] [address]    Report Status PENDING  Incomplete  Culture, blood (routine x 2)     Status: None (Preliminary result)   Collection Time: 10/01/18  6:49 PM  Result Value Ref Range Status   Specimen Description BLOOD BLOOD RIGHT ARM  Final   Special Requests   Final    BOTTLES DRAWN AEROBIC AND ANAEROBIC Blood Culture adequate volume   Culture   Final    NO GROWTH 2 DAYS Performed at [facility], [place] [address], [place], [place] [address]    Report Status PENDING  Incomplete     Time coordinating discharge: 35 minutes  SIGNED:   Aline August, MD  [facility] 10/03/2018, 9:52 AM Pager: [phone]  If 7PM-7AM, please contact night-coverage [email or web address] Password [email or web address]

## 2018-10-06 LAB — CULTURE, BLOOD (ROUTINE X 2)
Culture: NO GROWTH
Culture: NO GROWTH
SPECIAL REQUESTS: ADEQUATE
SPECIAL REQUESTS: ADEQUATE

## 2018-10-09 ENCOUNTER — Ambulatory Visit: Payer: Medicaid Other | Admitting: Cardiovascular Disease

## 2018-10-09 ENCOUNTER — Encounter: Payer: Self-pay | Admitting: Cardiovascular Disease

## 2018-10-09 VITALS — BP 122/74 | HR 66 | Ht 62.0 in | Wt 248.6 lb

## 2018-10-09 DIAGNOSIS — I5032 Chronic diastolic (congestive) heart failure: Secondary | ICD-10-CM | POA: Diagnosis not present

## 2018-10-09 DIAGNOSIS — I340 Nonrheumatic mitral (valve) insufficiency: Secondary | ICD-10-CM | POA: Diagnosis not present

## 2018-10-09 DIAGNOSIS — I10 Essential (primary) hypertension: Secondary | ICD-10-CM | POA: Diagnosis not present

## 2018-10-09 DIAGNOSIS — I482 Chronic atrial fibrillation, unspecified: Secondary | ICD-10-CM | POA: Diagnosis not present

## 2018-10-09 NOTE — Patient Instructions (Addendum)
Medication Instructions:   If you need a refill on your cardiac medications before your next appointment, please call your pharmacy.   Lab work:  If you have labs (blood work) drawn today and your tests are completely normal, you will receive your results only by: Marland Kitchen MyChart Message (if you have MyChart) OR . A paper copy in the mail If you have any lab test that is abnormal or we need to change your treatment, we will call you to review the results.  Testing/Procedures: NONE ordered today  Follow-Up: At [facility], you and your health needs are our priority.  As part of our continuing mission to provide you with exceptional heart care, we have created designated Provider Care Teams.  These Care Teams include your primary Cardiologist (physician) and Advanced Practice Providers (APPs -  Physician Assistants and Nurse Practitioners) who all work together to provide you with the care you need, when you need it. Your physician recommends that you schedule a follow-up appointment as needed with Dr. Johnsie Cancel.

## 2018-10-10 ENCOUNTER — Encounter (HOSPITAL_COMMUNITY): Payer: Self-pay | Admitting: Emergency Medicine

## 2018-10-10 ENCOUNTER — Ambulatory Visit (HOSPITAL_COMMUNITY)
Admission: EM | Admit: 2018-10-10 | Discharge: 2018-10-10 | Disposition: A | Discharge status: Home / Self Care | Payer: Medicaid Other | Attending: Physician Assistant | Admitting: Physician Assistant

## 2018-10-10 ENCOUNTER — Other Ambulatory Visit: Payer: Self-pay

## 2018-10-10 DIAGNOSIS — J189 Pneumonia, unspecified organism: Secondary | ICD-10-CM | POA: Diagnosis not present

## 2018-10-10 MED ORDER — MELOXICAM 15 MG PO TABS: 15.0000 mg | ORAL_TABLET | Freq: Every day | ORAL | 0 refills | Status: DC

## 2018-10-10 MED ORDER — AMOXICILLIN-POT CLAVULANATE 875-125 MG PO TABS: 1.0000 | ORAL_TABLET | Freq: Two times a day (BID) | ORAL | 0 refills | Status: DC

## 2018-10-10 NOTE — ED Triage Notes (Signed)
Patient was discharged post pneumonia admission.  Patient is here today for follow up.  Patient reports she was feeling better, but since Sunday has been feeling worse.

## 2018-10-10 NOTE — Discharge Instructions (Addendum)
If you continue getting worse go to hospital. Take the anabiotics.

## 2018-10-10 NOTE — ED Provider Notes (Signed)
10/10/2018 7:38 PM   DOB: Jun 02, 1961 / MRN: [id number]  SUBJECTIVE:  Kathleen Kane is a 57 y.o. female presenting for pneumonia which required admission she was discharged home the 24th of last month.  Reports generally feeling much better until about 3 days ago began to have a recurrence of the chest pressure that she was having prior to her most recent admission.  She saw her cardiologist yesterday.  She associates a worsening cough.  She is a diabetic.  She takes all of her medications as prescribed and has a follow-up appointment with her PCP on the 11th of this month.  She denies leg swelling and weight gain.  Tells me that her weight was 246 today.  She associates some shortness of breath with physical activity but tells me this is not new.  She takes apixaban for a history of atrial fibrillation.  She is allergic to ace inhibitors and lisinopril.   She  has a past medical history of Anxiety, Atrial fibrillation, persistent, CHF (congestive heart failure) ([place]) (2013), Depression, Diabetes mellitus without complication ([place]), Hypertension, Obesity, Obstructive sleep apnea, Pneumonia (10/02/2018), and Rheumatoid arthritis ([place] ).    She  reports that she quit smoking about 4 years ago. Her smoking use included cigarettes. She has a 18.00 pack-year smoking history. She has never used smokeless tobacco. She reports that she does not drink alcohol or use drugs. She  reports that she currently engages in sexual activity and has had partner(s) who are Female. The patient  has a past surgical history that includes Cesarean section; left breast cyst; laparotomy (Right, 02/27/2013); Salpingoophorectomy (Right, 02/27/2013); Cardiovascular stress test (02/16/2013); and doppler echocardiography (01/24/2008).  Her family history includes Diabetes in her brother, mother, other, paternal grandmother, and sister; Heart Problems in her sister; Hypertension in her brother, father, and mother; Kidney failure in her  sister.  Review of Systems  Constitutional: Negative for chills, diaphoresis and fever.  Eyes: Negative.   Respiratory: Negative for cough, hemoptysis, sputum production, shortness of breath and wheezing.   Cardiovascular: Negative for chest pain, orthopnea and leg swelling.  Gastrointestinal: Negative for abdominal pain, blood in stool, constipation, diarrhea, heartburn, melena, nausea and vomiting.  Genitourinary: Negative for dysuria, flank pain, frequency, hematuria and urgency.  Skin: Negative for rash.  Neurological: Negative for dizziness, sensory change, speech change, focal weakness and headaches.    OBJECTIVE:  BP (!) 124/54 (BP Location: Right Arm)   Pulse 87   Temp 98.2 F (36.8 C) (Oral)   Resp (!) 22   LMP 10/28/2013 Comment: perimenopausal  SpO2 98%   Wt Readings from Last 3 Encounters:  10/09/18 248 lb 9.6 oz (112.8 kg)  10/03/18 246 lb 6.4 oz (111.8 kg)  04/14/18 251 lb (113.9 kg)   Temp Readings from Last 3 Encounters:  10/10/18 98.2 F (36.8 C) (Oral)  10/02/18 98.3 F (36.8 C) (Oral)  04/14/18 99.1 F (37.3 C) (Oral)   BP Readings from Last 3 Encounters:  10/10/18 (!) 124/54  10/09/18 122/74  10/03/18 (!) 112/55   Pulse Readings from Last 3 Encounters:  10/10/18 87  10/09/18 66  10/03/18 90    Physical Exam  Constitutional: She is oriented to person, place, and time. She appears well-nourished. No distress.  Eyes: Pupils are equal, round, and reactive to light. EOM are normal.  Cardiovascular: Normal rate, regular rhythm, S1 normal, S2 normal, normal heart sounds and intact distal pulses. Exam reveals no gallop, no friction rub and no decreased pulses.  No murmur  heard. Pulmonary/Chest: Effort normal. No stridor. No respiratory distress. She has no wheezes. She has no rales. She exhibits no tenderness.  Abdominal: She exhibits no distension.  Musculoskeletal: She exhibits no edema.  Neurological: She is alert and oriented to person, place,  and time. No cranial nerve deficit. Gait normal.  Skin: Skin is dry. She is not diaphoretic.  Psychiatric: She has a normal mood and affect.  Vitals reviewed.   No results found for this or any previous visit (from the past 72 hour(s)).  No results found.  ASSESSMENT AND PLAN:   Community acquired pneumonia, unspecified laterality - From what I can gather from the chart she was given azithromycin and cephalosporin in the hospital and discharged on Z-Pak.  I am placing her on Augmentin today.  Of asked that she has worsening symptoms to please present to the ED.  X-ray deferred to primary on the 11th.  Patient is anticoagulated.  She saw her heart doctor yesterday and advised to continue taking all of her medications at their current doses.    Discharge Instructions     If you continue getting worse go to hospital. Take the anabiotics.          The patient is advised to call or return to clinic if she does not see an improvement in symptoms, or to seek the care of the closest emergency department if she worsens with the above plan.   Philis Fendt, MHS, PA-C 10/10/2018 7:38 PM   Kathleen Coop, PA-C 10/10/18 1938

## 2018-12-21 ENCOUNTER — Other Ambulatory Visit (HOSPITAL_COMMUNITY): Payer: Self-pay | Admitting: Cardiovascular Disease

## 2018-12-21 NOTE — Telephone Encounter (Signed)
Pt last saw Dr Johnsie Cancel 10/09/18, last labs Creat 0.81 on 10/03/18, age 58, weight 112.8kg, based on specified criteria pt is on appropriate dosage of Eliquis 5mg  BID.  Will refill rx.

## 2018-12-22 ENCOUNTER — Other Ambulatory Visit: Payer: Self-pay | Admitting: Cardiovascular Disease

## 2018-12-22 MED ORDER — LOSARTAN POTASSIUM 25 MG PO TABS: 25.0000 mg | ORAL_TABLET | Freq: Every day | ORAL | 3 refills | Status: DC

## 2018-12-29 ENCOUNTER — Emergency Department (HOSPITAL_COMMUNITY)
Admission: EM | Admit: 2018-12-29 | Discharge: 2018-12-29 | Disposition: A | Discharge status: Home / Self Care | Payer: Medicaid Other | Attending: Emergency Medicine | Admitting: Emergency Medicine

## 2018-12-29 ENCOUNTER — Emergency Department (HOSPITAL_COMMUNITY): Payer: Medicaid Other

## 2018-12-29 ENCOUNTER — Encounter (HOSPITAL_COMMUNITY): Payer: Self-pay

## 2018-12-29 ENCOUNTER — Other Ambulatory Visit: Payer: Self-pay

## 2018-12-29 DIAGNOSIS — J209 Acute bronchitis, unspecified: Secondary | ICD-10-CM | POA: Diagnosis not present

## 2018-12-29 DIAGNOSIS — J40 Bronchitis, not specified as acute or chronic: Secondary | ICD-10-CM

## 2018-12-29 DIAGNOSIS — I5033 Acute on chronic diastolic (congestive) heart failure: Secondary | ICD-10-CM | POA: Insufficient documentation

## 2018-12-29 DIAGNOSIS — E119 Type 2 diabetes mellitus without complications: Secondary | ICD-10-CM | POA: Diagnosis not present

## 2018-12-29 DIAGNOSIS — I110 Hypertensive heart disease with heart failure: Secondary | ICD-10-CM | POA: Diagnosis not present

## 2018-12-29 DIAGNOSIS — R0602 Shortness of breath: Secondary | ICD-10-CM

## 2018-12-29 DIAGNOSIS — Z79899 Other long term (current) drug therapy: Secondary | ICD-10-CM | POA: Diagnosis not present

## 2018-12-29 DIAGNOSIS — Z87891 Personal history of nicotine dependence: Secondary | ICD-10-CM | POA: Diagnosis not present

## 2018-12-29 DIAGNOSIS — R079 Chest pain, unspecified: Secondary | ICD-10-CM | POA: Diagnosis present

## 2018-12-29 LAB — CBC WITH DIFFERENTIAL/PLATELET
ABS IMMATURE GRANULOCYTES: 0.03 10*3/uL (ref 0.00–0.07)
BASOS ABS: 0.1 10*3/uL (ref 0.0–0.1)
Basophils Relative: 1 %
Eosinophils Absolute: 0.2 10*3/uL (ref 0.0–0.5)
Eosinophils Relative: 3 %
HCT: 45.7 % (ref 36.0–46.0)
Hemoglobin: 12.9 g/dL (ref 12.0–15.0)
IMMATURE GRANULOCYTES: 1 %
Lymphocytes Relative: 21 %
Lymphs Abs: 1.4 10*3/uL (ref 0.7–4.0)
MCH: 27.9 pg (ref 26.0–34.0)
MCHC: 28.2 g/dL — ABNORMAL LOW (ref 30.0–36.0)
MCV: 98.9 fL (ref 80.0–100.0)
MONOS PCT: 5 %
Monocytes Absolute: 0.3 10*3/uL (ref 0.1–1.0)
NEUTROS ABS: 4.6 10*3/uL (ref 1.7–7.7)
NEUTROS PCT: 69 %
NRBC: 0 % (ref 0.0–0.2)
PLATELETS: 291 10*3/uL (ref 150–400)
RBC: 4.62 MIL/uL (ref 3.87–5.11)
RDW: 14.8 % (ref 11.5–15.5)
WBC: 6.6 10*3/uL (ref 4.0–10.5)

## 2018-12-29 LAB — COMPREHENSIVE METABOLIC PANEL
ALBUMIN: 3.7 g/dL (ref 3.5–5.0)
ALT: 22 U/L (ref 0–44)
AST: 23 U/L (ref 15–41)
Alkaline Phosphatase: 59 U/L (ref 38–126)
Anion gap: 14 (ref 5–15)
BUN: 20 mg/dL (ref 6–20)
CALCIUM: 9.5 mg/dL (ref 8.9–10.3)
CO2: 19 mmol/L — AB (ref 22–32)
Chloride: 105 mmol/L (ref 98–111)
Creatinine, Ser: 0.85 mg/dL (ref 0.44–1.00)
GFR calc Af Amer: >60 mL/min (ref >60)
GFR calc non Af Amer: >60 mL/min (ref >60)
GLUCOSE: 140 mg/dL — AB (ref 70–99)
POTASSIUM: 3.7 mmol/L (ref 3.5–5.1)
SODIUM: 138 mmol/L (ref 135–145)
Total Bilirubin: 0.7 mg/dL (ref 0.3–1.2)
Total Protein: 6.8 g/dL (ref 6.5–8.1)

## 2018-12-29 LAB — BRAIN NATRIURETIC PEPTIDE: B Natriuretic Peptide: 103.6 pg/mL — ABNORMAL HIGH (ref 0.0–100.0)

## 2018-12-29 LAB — I-STAT TROPONIN, ED: Troponin i, poc: 0.01 ng/mL (ref 0.00–0.08)

## 2018-12-29 LAB — TROPONIN I: Troponin I: <0.03 ng/mL (ref <0.03)

## 2018-12-29 MED ORDER — BENZONATATE 100 MG PO CAPS: 100.0000 mg | ORAL_CAPSULE | Freq: Three times a day (TID) | ORAL | 0 refills | Status: DC | PRN

## 2018-12-29 MED ORDER — DOXYCYCLINE HYCLATE 100 MG PO CAPS: 100.0000 mg | ORAL_CAPSULE | Freq: Two times a day (BID) | ORAL | 0 refills | Status: AC

## 2018-12-29 MED ORDER — HYDROCODONE-HOMATROPINE 5-1.5 MG/5ML PO SYRP: 5.0000 mL | ORAL_SOLUTION | Freq: Four times a day (QID) | ORAL | 0 refills | Status: DC | PRN

## 2018-12-29 MED ORDER — FUROSEMIDE 10 MG/ML IJ SOLN: 20.0000 mg | Freq: Once | INTRAMUSCULAR | Status: DC

## 2018-12-29 MED ORDER — METOPROLOL TARTRATE 25 MG PO TABS
12.5000 mg | ORAL_TABLET | Freq: Once | ORAL | Status: AC
  Administered 2018-12-29: 12.5 mg via ORAL
  Filled 2018-12-29: qty 1

## 2018-12-29 NOTE — ED Triage Notes (Signed)
C/o cough and chest congestion x2 weeks. No SOB or dizziness, has noticed some bilateral swelling in her feet. She states last time she felt like this she was diagnosed with CHF.

## 2018-12-29 NOTE — Discharge Instructions (Addendum)
Take the antibiotic as prescribed  STOP taking over-the-counter medications for your cough; take the prescribed medications as needed  Continue your lasix twice a day; if your symptoms do not improve, you can take this three times a day for 2 days then see how you're feeling; call your doctor

## 2018-12-29 NOTE — ED Provider Notes (Signed)
[facility] EMERGENCY DEPARTMENT Provider Note   CSN: [id number] Arrival date & time: 12/29/18  1619    History   Chief Complaint Chief Complaint  Patient presents with  . Chest Pain    HPI Kathleen Kane is a 58 y.o. female.     HPI  58 year old female with past medical history as below here with shortness of breath and cough.  The patient states that for the last 2 weeks, she has had progressively worsening, mild, intermittently productive cough.  This seems to have worsened over the last several days.  She had some mild nasal congestion and intermittent sputum production with it.  She also states that she has some dyspnea with exertion, though this is not necessarily new for her.  She has been taking her medications as prescribed.  She does not believe she is put on any weight recently, though she does not check it regularly.  Denies any increased pain or swelling in her legs.  No recent immobilization or travel.  No fevers or chills.  She is otherwise well.  She has some intermittent chest pressure when she is short of breath, but this is also not new for her.  No chest pain at rest.  No other complaints.   Past Medical History:  Diagnosis Date  . Anxiety   . Atrial fibrillation, persistent   . CHF (congestive heart failure) ([place]) 2013   No echo found from that time, most likely diastolic  . Depression   . Diabetes mellitus without complication ([place])   . Hypertension   . Obesity   . Obstructive sleep apnea    Sleep study performed 10/18/2008. AHI-6.8/hr, during REM-27.3/hr. RDI-25.0/hr, during REM-40.9/hr. avg o2 sat during REM and NREM 95%  . Pneumonia 10/02/2018  . Rheumatoid arthritis [facility])     Patient Active Problem List   Diagnosis Date Noted  . HCAP (healthcare-associated pneumonia) 10/02/2018  . CHF exacerbation ([place]) 10/01/2018  . Community acquired pneumonia 10/01/2018  . Acute on chronic diastolic CHF (congestive heart failure) ([place])  08/08/2015  . Morbid obesity ([place]) 05/06/2015  . Essential hypertension 04/10/2015  . Numbness and tingling in left hand 10/01/2013  . Tobacco abuse 10/01/2013  . Elevated CA-125 02/06/2013  . Pelvic mass in female 01/29/2013  . Diabetes mellitus ([place]) 08/10/2012  . Atrial fibrillation ([place])   . Chronic diastolic CHF (congestive heart failure) ([place])     Past Surgical History:  Procedure Laterality Date  . CARDIOVASCULAR STRESS TEST  02/16/2013   no significant EKG changes with Lexiscan, normal LV function and normal wall function  . CESAREAN SECTION     x2  . DOPPLER ECHOCARDIOGRAPHY  01/24/2008   EF 55-60%, no diagnostic evidence of LV wall motion abnormalities, LV wall thickness was mild-moderately increased.  Marland Kitchen LAPAROTOMY Right 02/27/2013   Procedure: EXPLORATORY LAPAROTOMY, right salpingo-oopherectomy;  Surgeon: [name], MD;  Location: [facility] ORS;  Service: Gynecology;  Laterality: Right;  . left breast cyst    . SALPINGOOPHORECTOMY Right 02/27/2013   Procedure: SALPINGO OOPHORECTOMY;  Surgeon: [name], MD;  Location: [facility] ORS;  Service: Gynecology;  Laterality: Right;     OB History   No obstetric history on file.      Home Medications    Prior to Admission medications   Medication Sig Start Date End Date Taking? Authorizing Provider  amoxicillin-clavulanate (AUGMENTIN) 875-125 MG tablet Take 1 tablet by mouth every 12 (twelve) hours. 10/10/18   Tereasa Coop, PA-C  benzonatate (TESSALON) 100  MG capsule Take 1 capsule (100 mg total) by mouth 3 (three) times daily as needed for cough. 12/29/18   Duffy Bruce, MD  blood glucose meter kit and supplies Dispense based on patient and insurance preference. Use up to four times daily as directed. (FOR ICD-10 E10.9, E11.9). 10/03/18   Aline August, MD  diclofenac sodium (VOLTAREN) 1 % GEL Apply 2 g topically 4 (four) times daily. 04/14/18   Kirichenko, Lahoma Rocker, PA-C  doxycycline (VIBRAMYCIN) 100 MG capsule Take 1 capsule  (100 mg total) by mouth 2 (two) times daily for 7 days. 12/29/18 01/05/19  Duffy Bruce, MD  ELIQUIS 5 MG TABS tablet TAKE 1 TABLET BY MOUTH TWICE A DAY 12/21/18   Josue Hector, MD  folic acid (FOLVITE) 1 MG tablet Take 1 mg by mouth daily. 08/19/17   [provider]  furosemide (LASIX) 20 MG tablet Take 2 tablets (40 mg total) by mouth 2 (two) times daily. 10/03/18   Aline August, MD  HYDROcodone-homatropine (HYCODAN) 5-1.5 MG/5ML syrup Take 5 mLs by mouth every 6 (six) hours as needed for cough. 12/29/18   Duffy Bruce, MD  inFLIXimab (REMICADE IV) Inject into the vein every 8 (eight) weeks. Administered by Dr. Rogers Blocker, Rondall Allegra - next injection due 10/16/18    [provider]  losartan (COZAAR) 25 MG tablet Take 1 tablet (25 mg total) by mouth daily. 12/22/18   Josue Hector, MD  meloxicam (MOBIC) 15 MG tablet Take 1 tablet (15 mg total) by mouth daily. 10/10/18   Tereasa Coop, PA-C  metFORMIN (GLUCOPHAGE) 500 MG tablet Take 2 tablets (1,000 mg total) by mouth 2 (two) times daily. 10/03/18   Aline August, MD  methotrexate (RHEUMATREX) 2.5 MG tablet Take 10 mg by mouth every Sunday. Caution:Chemotherapy. Protect from light.     [provider]  metoprolol tartrate (LOPRESSOR) 25 MG tablet Take 0.5 tablets (12.5 mg total) by mouth 2 (two) times daily. Please make appt with Dr. Johnsie Cancel for December for future refills. 1st attempt 10/03/18   Aline August, MD  NOVOLOG MIX 70/30 FLEXPEN (70-30) 100 UNIT/ML FlexPen Inject 0.4 mLs (40 Units total) into the skin 2 (two) times daily with a meal. 10/03/18   Starla Link, Kshitiz, MD  ondansetron (ZOFRAN) 4 MG tablet Take 1 tablet (4 mg total) by mouth every 6 (six) hours as needed for nausea. 10/03/18   Aline August, MD  pantoprazole (PROTONIX) 40 MG tablet Take 1 tablet (40 mg total) by mouth daily for 15 days. 10/03/18 10/18/18  Aline August, MD  potassium chloride SA (KLOR-CON M20) 20 MEQ tablet Take 2 tablets (40 mEq  total) by mouth daily. 10/04/18   Aline August, MD  predniSONE (DELTASONE) 5 MG tablet Take 3 tablets (15 mg total) by mouth daily with breakfast. 15 mg daily for a week then reevaluate by PCP 10/04/18   Aline August, MD  traMADol (ULTRAM) 50 MG tablet Take 1 tablet (50 mg total) by mouth every 6 (six) hours as needed for moderate pain. 10/03/18   Aline August, MD    Family History Family History  Problem Relation Age of Onset  . Hypertension Mother   . Diabetes Mother   . Hypertension Father   . Diabetes Other   . Diabetes Paternal Grandmother   . Diabetes Sister   . Heart Problems Sister   . Kidney failure Sister   . Hypertension Brother   . Diabetes Brother     Social History Social History   Tobacco Use  .  Smoking status: Former Smoker    Packs/day: 0.75    Years: 24.00    Pack years: 18.00    Types: Cigarettes    Last attempt to quit: 06/08/2014    Years since quitting: 4.5  . Smokeless tobacco: Never Used  . Tobacco comment: smoking cessation info given.   Substance Use Topics  . Alcohol use: No    Alcohol/week: 0.0 standard drinks  . Drug use: No     Allergies   Ace inhibitors and Lisinopril   Review of Systems Review of Systems  Constitutional: Positive for fatigue. Negative for chills and fever.  HENT: Negative for congestion and rhinorrhea.   Eyes: Negative for visual disturbance.  Respiratory: Positive for cough and shortness of breath. Negative for wheezing.   Cardiovascular: Negative for chest pain and leg swelling.  Gastrointestinal: Negative for abdominal pain, diarrhea, nausea and vomiting.  Genitourinary: Negative for dysuria and flank pain.  Musculoskeletal: Negative for neck pain and neck stiffness.  Skin: Negative for rash and wound.  Allergic/Immunologic: Negative for immunocompromised state.  Neurological: Positive for weakness. Negative for syncope and headaches.  All other systems reviewed and are negative.    Physical  Exam Updated Vital Signs BP 114/75   Pulse 91   Resp 19   Ht _0  (1.575 m)   Wt 111.1 kg   LMP 10/28/2013 Comment: perimenopausal  SpO2 98%   BMI 44.81 kg/m   Physical Exam Vitals signs and nursing note reviewed.  Constitutional:      General: She is not in acute distress.    Appearance: She is well-developed.  HENT:     Head: Normocephalic and atraumatic.  Eyes:     Conjunctiva/sclera: Conjunctivae normal.  Neck:     Musculoskeletal: Neck supple.  Cardiovascular:     Rate and Rhythm: Tachycardia present. Rhythm irregularly irregular.     Heart sounds: Normal heart sounds. No murmur. No friction rub.  Pulmonary:     Effort: Pulmonary effort is normal. No respiratory distress.     Breath sounds: Normal breath sounds. No wheezing or rales.  Abdominal:     General: There is no distension.     Palpations: Abdomen is soft.     Tenderness: There is no abdominal tenderness.  Musculoskeletal:     Right lower leg: Edema (Trace) present.     Left lower leg: Edema (Trace) present.  Skin:    General: Skin is warm.     Capillary Refill: Capillary refill takes less than 2 seconds.  Neurological:     Mental Status: She is alert and oriented to person, place, and time.     Motor: No abnormal muscle tone.      ED Treatments / Results  Labs (all labs ordered are listed, but only abnormal results are displayed) Labs Reviewed  CBC WITH DIFFERENTIAL/PLATELET - Abnormal; Notable for the following components:      Result Value   MCHC 28.2 (*)    All other components within normal limits  COMPREHENSIVE METABOLIC PANEL - Abnormal; Notable for the following components:   CO2 19 (*)    Glucose, Bld 140 (*)    All other components within normal limits  BRAIN NATRIURETIC PEPTIDE - Abnormal; Notable for the following components:   B Natriuretic Peptide 103.6 (*)    All other components within normal limits  TROPONIN I  I-STAT TROPONIN, ED    EKG EKG  Interpretation  Date/Time:  Friday December 29 2018 20:16:32 EST Ventricular Rate:  92  PR Interval:    QRS Duration: 85 QT Interval:  519 QTC Calculation: 643 R Axis:   5 Text Interpretation:  Atrial fibrillation Ventricular premature complex Consider anterior infarct Borderline T abnormalities, inferior leads Prolonged QT interval No significant change since last tracing Confirmed by Duffy Bruce [phone]) on 12/29/2018 8:26:44 PM   Radiology Dg Chest 2 View  Result Date: 12/29/2018 CLINICAL DATA:  Left-sided chest pain. EXAM: CHEST - 2 VIEW COMPARISON:  10/01/2018 FINDINGS: The lungs are clear without focal pneumonia, edema, pneumothorax or pleural effusion. Cardiopericardial silhouette is at upper limits of normal for size. The visualized bony structures of the thorax are intact. Telemetry leads overlie the chest. IMPRESSION: No active cardiopulmonary disease. Electronically Signed   By: Misty Stanley M.D.   On: 12/29/2018 18:01    Procedures Procedures (including critical care time)  Medications Ordered in ED Medications  metoprolol tartrate (LOPRESSOR) tablet 12.5 mg (12.5 mg Oral Given 12/29/18 1944)     Initial Impression / Assessment and Plan / ED Course  I have reviewed the triage vital signs and the nursing notes.  Pertinent labs & imaging results that were available during my care of the patient were reviewed by me and considered in my medical decision making (see chart for details).        58 year old female here with mild cough, shortness of breath, and chest congestion.  I suspect this is likely secondary to bronchitis and she has multiple sick contacts.  Chest x-ray shows no acute abnormality.  BNP is at baseline.  She does not appear overtly hypervolemic.  Her EKG is nonischemic and troponin is negative with no actual chest pain and I do not suspect ACS or ischemic etiology.  She was ambulated in the ED without difficulty.  Heart rate is controlled on her normal  metoprolol.  Will treat for possible early bronchitis, advised her to continue her Lasix at home, and refer for outpatient follow-up.  No lower extremity swelling, pain, or evidence to suggest DVT or PE.  Final Clinical Impressions(s) / ED Diagnoses   Final diagnoses:  Bronchitis  SOB (shortness of breath)    ED Discharge Orders         Ordered    doxycycline (VIBRAMYCIN) 100 MG capsule  2 times daily     12/29/18 2125    benzonatate (TESSALON) 100 MG capsule  3 times daily PRN     12/29/18 2125    HYDROcodone-homatropine (HYCODAN) 5-1.5 MG/5ML syrup  Every 6 hours PRN     12/29/18 2125           Duffy Bruce, MD 12/29/18 2249

## 2019-01-24 ENCOUNTER — Other Ambulatory Visit: Payer: Self-pay | Admitting: Internal Medicine

## 2019-01-24 DIAGNOSIS — N6002 Solitary cyst of left breast: Secondary | ICD-10-CM

## 2019-01-26 ENCOUNTER — Ambulatory Visit
Admission: RE | Admit: 2019-01-26 | Discharge: 2019-01-26 | Disposition: A | Discharge status: Home / Self Care | Payer: Medicaid Other | Source: Ambulatory Visit | Attending: Internal Medicine | Admitting: Internal Medicine

## 2019-01-26 ENCOUNTER — Other Ambulatory Visit: Payer: Self-pay

## 2019-01-26 DIAGNOSIS — N6002 Solitary cyst of left breast: Secondary | ICD-10-CM

## 2019-02-07 ENCOUNTER — Telehealth: Payer: Self-pay | Admitting: Cardiovascular Disease

## 2019-02-07 DIAGNOSIS — I4892 Unspecified atrial flutter: Secondary | ICD-10-CM

## 2019-02-07 MED ORDER — FUROSEMIDE 40 MG PO TABS: 40.0000 mg | ORAL_TABLET | Freq: Two times a day (BID) | ORAL | 2 refills | Status: DC

## 2019-02-07 MED ORDER — METOPROLOL TARTRATE 25 MG PO TABS: 12.5000 mg | ORAL_TABLET | Freq: Two times a day (BID) | ORAL | 2 refills | Status: DC

## 2019-02-07 NOTE — Telephone Encounter (Signed)
New Message:    Pt wants to know if she needs to do a phone visit or some type of visit so she can get her mediicine refilled. She says she needs her medicine.

## 2019-02-07 NOTE — Telephone Encounter (Signed)
Patient needed refill on lasix and metoprolol. Patient was seen in December 2019. Patient is to follow up PRN but her medications are good up until December 2020. Asked patient to have her PCP start filling heart medications at that time. Patient verbalized understanding and will call with any other concerns.

## 2019-10-28 ENCOUNTER — Other Ambulatory Visit: Payer: Self-pay | Admitting: Cardiovascular Disease

## 2019-10-29 NOTE — Telephone Encounter (Signed)
Per telephone note 02/07/19, pt's follow-up is prn, pt last saw Dr Johnsie Cancel 10/09/18, pt was notified to have PCP start filling cardiac meds.  Called spoke with pt, reminded her of above and she states she will notify her PCP she needs a refill.  Will also refuse rx refill with note to pharmacist to send request to pt's PCP.

## 2020-01-10 ENCOUNTER — Other Ambulatory Visit: Payer: Self-pay | Admitting: Internal Medicine

## 2020-01-10 DIAGNOSIS — Z1231 Encounter for screening mammogram for malignant neoplasm of breast: Secondary | ICD-10-CM

## 2020-01-14 ENCOUNTER — Ambulatory Visit: Payer: Medicaid Other | Admitting: Podiatry

## 2020-01-17 ENCOUNTER — Other Ambulatory Visit: Payer: Self-pay

## 2020-01-17 ENCOUNTER — Ambulatory Visit: Payer: Medicaid Other | Admitting: Podiatry

## 2020-01-17 ENCOUNTER — Encounter: Payer: Self-pay | Admitting: Podiatry

## 2020-01-17 VITALS — Temp 97.2°F

## 2020-01-17 DIAGNOSIS — M79675 Pain in left toe(s): Secondary | ICD-10-CM

## 2020-01-17 DIAGNOSIS — E1140 Type 2 diabetes mellitus with diabetic neuropathy, unspecified: Secondary | ICD-10-CM

## 2020-01-17 DIAGNOSIS — M79674 Pain in right toe(s): Secondary | ICD-10-CM

## 2020-01-17 DIAGNOSIS — B351 Tinea unguium: Secondary | ICD-10-CM | POA: Diagnosis not present

## 2020-01-17 DIAGNOSIS — E1149 Type 2 diabetes mellitus with other diabetic neurological complication: Secondary | ICD-10-CM | POA: Diagnosis not present

## 2020-01-17 NOTE — Patient Instructions (Signed)

## 2020-01-18 NOTE — Progress Notes (Signed)
Subjective:   Patient ID: Kathleen Kane, female   DOB: 59 y.o.   MRN: [id number]   HPI Patient presents with thick yellow brittle nailbeds 1-5 both feet that are incurvated and make it hard for her to wear shoe gear.  This is been ongoing and becoming more of an issue for her and she is having trouble with this and also has numbness in both her feet that seems to be worsening and long-term history of diabetes that is under fair control.  Patient does smoke and is not active   Review of Systems  All other systems reviewed and are negative.       Objective:  Physical Exam Vitals and nursing note reviewed.  Constitutional:      Appearance: She is well-developed.  Pulmonary:     Effort: Pulmonary effort is normal.  Musculoskeletal:        General: Normal range of motion.  Skin:    General: Skin is warm.  Neurological:     Mental Status: She is alert.     Neurovascular status found to be reduced with diminished sharp dull vibratory diminished circulatory status with pulses that are present but diminished.  Patient is found to have thickened nailbeds 1-5 both feet that are incurvated in the corners and can become tender at different times.  Patient is noted to have no indications of venous stasis currently and other indications of advanced vascular disease F2     Assessment:  Patient who is in poor control diabetes with moderate to significant neuropathy localized with no breaks in skin but thick yellow brittle nailbeds she cannot take care of     Plan:  Reviewed all conditions did initial H&P diabetic education rendered and debridement of nailbeds 1-5 both feet accomplished with no iatrogenic bleeding noted

## 2020-02-06 NOTE — Progress Notes (Signed)
CARDIOLOGY OFFICE NOTE  Date:  02/11/2020    Kathleen Kane Date of Birth: 05-Aug-1961 Medical Record #[id number]  PCP:  [name], MD  Cardiologist:  Gillian Shields    Chief Complaint  Patient presents with  . Follow-up    Seen for Dr. Johnsie Cancel    History of Present Illness: Kathleen Kane is a 59 y.o. female who presents today for a follow up visit. Seen for Dr. Johnsie Cancel.    She has a history of persistent AF - on Eliquis, diastolic HF, OSA (does not use CPAP), HTN, DM, RA, obesity and prior tobacco abuse. Normal Myoview from 2014.   She was hospitalized in 2019 with pneumonia. Noted non compliance with meds. Atherosclerosis noted on that CT scan.   Last seen in December of 2019 by Dr. Johnsie Cancel - noted to have poor insight into her health issues. Seemed more limited from her RA.   The patient does not have symptoms concerning for COVID-19 infection (fever, chills, cough, or new shortness of breath).   Comes in today. Here with her daughter [name]. She is pretty sedentary due to her RA. She says she is here because the eye doctor "saw a blood clot or a stroke in her eye", concern over her AF and if that was normal for her,  getting her echo updated as well and the blockage that was noted on prior CT. She has no active chest pain. She is very sedentary. She will have some shortness of breath if she over exerts. Unclear what her diabetes status is - no recent A1C. She is not on statin therapy - not really clear as to why. She says she has just started trying to eat better. She does not understand why she has not lost weight. She says she is taking her Eliquis - no missed doses.   Past Medical History:  Diagnosis Date  . Acute on chronic diastolic CHF (congestive heart failure) ([place]) 08/08/2015  . Anxiety   . Atrial fibrillation, persistent ([place])   . Carpal tunnel syndrome, right 12/15/2016  . CHF (congestive heart failure) ([place]) 2013   No echo found from that time, most  likely diastolic  . CHF exacerbation ([place]) 10/01/2018  . Depression   . Diabetes mellitus ([place]) 08/10/2012  . Diabetes mellitus without complication ([place])   . Hypertension   . 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 Morbid obesity ([place]) 05/06/2015  . Obesity   . Obstructive sleep apnea    Sleep study performed 10/18/2008. AHI-6.8/hr, during REM-27.3/hr. RDI-25.0/hr, during REM-40.9/hr. avg o2 sat during REM and NREM 95%  . OSA (obstructive sleep apnea) 02/10/2017  . Pelvic mass in female 01/29/2013   With ascites   . Pneumonia 10/02/2018  . Rheumatoid arthritis ([place])   . Rheumatoid arthritis, 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[place]) 10/08/2015    Past Surgical History:  Procedure Laterality Date  . CARDIOVASCULAR STRESS TEST  02/16/2013   no significant EKG changes with Lexiscan, normal LV function and normal wall function  . CESAREAN SECTION     x2  . DOPPLER ECHOCARDIOGRAPHY  01/24/2008   EF 55-60%, no diagnostic evidence of LV wall motion abnormalities, LV wall thickness was mild-moderately increased.  Marland Kitchen LAPAROTOMY Right 02/27/2013   Procedure: EXPLORATORY LAPAROTOMY, right salpingo-oopherectomy;  Surgeon: [name], MD;  Location: [facility];  Service: Gynecology;  Laterality: Right;  . left breast cyst    . SALPINGOOPHORECTOMY Right 02/27/2013   Procedure: SALPINGO OOPHORECTOMY;  Surgeon:  [name], MD;  Location: [facility];  Service: Gynecology;  Laterality: Right;     Medications: Current Meds  Medication Sig  . blood glucose meter kit and supplies Dispense based on patient and insurance preference. Use up to four times daily as directed. (FOR ICD-10 E10.9, E11.9).  Marland Kitchen diclofenac sodium (VOLTAREN) 1 % GEL Apply 2 g topically 4 (four) times daily.  Marland Kitchen ELIQUIS 5 MG TABS tablet TAKE 1 TABLET BY MOUTH TWICE A DAY  . folic acid (FOLVITE) 1 MG tablet Take 1 mg by mouth daily.  . furosemide (LASIX) 40 MG tablet TAKE 1 TABLET BY MOUTH TWICE A DAY  . losartan (COZAAR) 25 MG  tablet TAKE 1 TABLET BY MOUTH EVERY DAY  . metFORMIN (GLUCOPHAGE) 500 MG tablet Take 2 tablets (1,000 mg total) by mouth 2 (two) times daily.  . methotrexate (RHEUMATREX) 2.5 MG tablet Take 10 mg by mouth every Sunday. Caution:Chemotherapy. Protect from light.   . metoprolol tartrate (LOPRESSOR) 25 MG tablet Take 0.5 tablets (12.5 mg total) by mouth 2 (two) times daily.  Marland Kitchen NOVOLOG MIX 70/30 FLEXPEN (70-30) 100 UNIT/ML FlexPen Inject 0.4 mLs (40 Units total) into the skin 2 (two) times daily with a meal.  . predniSONE (DELTASONE) 5 MG tablet Take 3 tablets (15 mg total) by mouth daily with breakfast. 15 mg daily for a week then reevaluate by PCP  . traMADol (ULTRAM) 50 MG tablet Take 1 tablet (50 mg total) by mouth every 6 (six) hours as needed for moderate pain.     Allergies: Allergies  Allergen Reactions  . Ace Inhibitors Cough  . Lisinopril Cough    Social History: The patient  reports that she quit smoking about 5 years ago. Her smoking use included cigarettes. She has a 18.00 pack-year smoking history. She has never used smokeless tobacco. She reports that she does not drink alcohol or use drugs.   Family History: The patient's family history includes Diabetes in her brother, mother, paternal grandmother, sister, and another family member; Heart Problems in her sister; Hypertension in her brother, father, and mother; Kidney failure in her sister.   Review of Systems: Please see the history of present illness.   All other systems are reviewed and negative.   Physical Exam: VS:  BP 126/72   Pulse 87   Ht '5\' 2"'  (1.575 m)   Wt 245 lb 12.8 oz (111.5 kg)   LMP 10/28/2013 Comment: perimenopausal  SpO2 96%   BMI 44.96 kg/m  .  BMI Body mass index is 44.96 kg/m.  Wt Readings from Last 3 Encounters:  02/11/20 245 lb 12.8 oz (111.5 kg)  12/29/18 245 lb (111.1 kg)  10/09/18 248 lb 9.6 oz (112.8 kg)    General: Pleasant. She is in a wheelchair. She is morbidly obese. She is in no  acute distress.   HEENT: Normal.  Neck: Thick.  Cardiac: Regular rate and rhythm. No murmurs, rubs, or gallops. No significant edema.  Respiratory:  Lungs are clear to auscultation bilaterally with normal work of breathing.  GI: Soft and nontender.  MS: No deformity or atrophy. Gait not tested. She is in a wheelchair.    Skin: Warm and dry. Color is normal.  Neuro:  Strength and sensation are intact and no gross focal deficits noted.  Psych: Alert, appropriate and with normal affect.   LABORATORY DATA:  EKG:  EKG is ordered today.  Personally reviewed by me. This demonstrates AF with controlled VR - HR is 87.  Lab  Results  Component Value Date   WBC 6.6 12/29/2018   HGB 12.9 12/29/2018   HCT 45.7 12/29/2018   PLT 291 12/29/2018   GLUCOSE 140 (H) 12/29/2018   CHOL  01/24/2008    155        ATP III CLASSIFICATION:  <200     mg/dL   Desirable  200-239  mg/dL   Borderline High  >=240    mg/dL   High   TRIG 141 01/24/2008   HDL 30 (L) 01/24/2008   LDLCALC  01/24/2008    97        Total Cholesterol/HDL:CHD Risk Coronary Heart Disease Risk Table                     Men   Women  1/2 Average Risk   3.4   3.3   ALT 22 12/29/2018   AST 23 12/29/2018   NA 138 12/29/2018   K 3.7 12/29/2018   CL 105 12/29/2018   CREATININE 0.85 12/29/2018   BUN 20 12/29/2018   CO2 19 (L) 12/29/2018   TSH 1.162 Test methodology is 3rd generation TSH 01/23/2008   INR 1.08 01/10/2016   HGBA1C 8.5 (H) 10/02/2018     BNP (last 3 results) No results for input(s): BNP in the last 8760 hours.  ProBNP (last 3 results) No results for input(s): PROBNP in the last 8760 hours.   Other Studies Reviewed Today:  Echo Study Conclusions 09/2018  - Left ventricle: The cavity size was normal. Systolic function was  normal. The estimated ejection fraction was in the range of 50%  to 55%. Although no diagnostic regional wall motion abnormality  was identified, this possibility cannot be completely  excluded on  the basis of this study. The study is not technically sufficient  to allow evaluation of LV diastolic function.  - Aortic valve: Transvalvular velocity was within the normal range.  There was no stenosis. There was no regurgitation.  - Mitral valve: Mildly thickened leaflets . There was moderate  regurgitation.  - Left atrium: The atrium was moderately dilated.  - Right ventricle: The cavity size was mildly dilated. Wall  thickness was normal. Systolic function was low normal. RV  systolic pressure (S, est): 48 mm Hg.  - Right atrium: The atrium was normal in size. Central venous  pressure (est): 8 mm Hg.  - Tricuspid valve: There was moderate regurgitation.  - Pulmonary arteries: Systolic pressure was moderately increased.  PA peak pressure: 48 mm Hg (S).  - Inferior vena cava: The vessel was dilated. The respirophasic  diameter changes were in the normal range (= 50%).  - Pericardium, extracardiac: There was no pericardial effusion.     Assessment/Plan:  1. Persistent AF - explained that this is a chronic condition - managed with rate control and anticoagulation. She is on Eliquis. Her rate is stable with low dose beta blocker - would continue. She had surveillance labs noted in [place] from February. Needs to be using CPAP.   2. 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   3. HLD - most likely needs statin - checking lab today.   4. Chronic diastolic dysfunction - BP is ok. Updating her echo. Needs salt restriction.   5. Valvular heart disease - no significant murmur on exam - will get her echo updated.   6. DM - poorly controlled I suspect - checking A1C today.  7. Obesity - explained the need for modification of her diet along with increased activity - her RA makes this challenging for her.   8. COVID-19 Education: The signs and symptoms of COVID-19 were  discussed with the patient and how to seek care for testing (follow up with PCP or arrange E-visit).  The importance of social distancing, staying at home, hand hygiene and wearing a mask when out in public were discussed today.  Current medicines are reviewed with the patient today.  The patient does not have concerns regarding medicines other than what has been noted above.  The following changes have been made:  See above.  Labs/ tests ordered today include:    Orders Placed This Encounter  Procedures  . Lipid panel  . Hepatic function panel  . Hemoglobin A1c  . EKG 12-Lead  . ECHOCARDIOGRAM COMPLETE  . VAS US CAROTID     Disposition:   FU with [place] in about 4 months.      Patient is agreeable to this plan and will call if any problems develop in the interim.   SignedTruitt Merle, NP  02/11/2020 3:26 PM  [facility] [address] [place], [place]  [address] Phone: [phone] Fax: [phone]

## 2020-02-11 ENCOUNTER — Other Ambulatory Visit: Payer: Self-pay

## 2020-02-11 ENCOUNTER — Ambulatory Visit: Payer: Medicaid Other | Admitting: Nurse Practitioner

## 2020-02-11 ENCOUNTER — Encounter: Payer: Self-pay | Admitting: Nurse Practitioner

## 2020-02-11 VITALS — BP 126/72 | HR 87 | Ht 62.0 in | Wt 245.8 lb

## 2020-02-11 DIAGNOSIS — E782 Mixed hyperlipidemia: Secondary | ICD-10-CM | POA: Diagnosis not present

## 2020-02-11 DIAGNOSIS — I340 Nonrheumatic mitral (valve) insufficiency: Secondary | ICD-10-CM

## 2020-02-11 DIAGNOSIS — I5032 Chronic diastolic (congestive) heart failure: Secondary | ICD-10-CM | POA: Diagnosis not present

## 2020-02-11 DIAGNOSIS — I10 Essential (primary) hypertension: Secondary | ICD-10-CM | POA: Diagnosis not present

## 2020-02-11 DIAGNOSIS — I482 Chronic atrial fibrillation, unspecified: Secondary | ICD-10-CM | POA: Diagnosis not present

## 2020-02-11 DIAGNOSIS — H34219 Partial retinal artery occlusion, unspecified eye: Secondary | ICD-10-CM

## 2020-02-11 NOTE — Patient Instructions (Addendum)
After Visit Summary:  We will be checking the following labs today - A1C, Lipids and LFTs   Medication Instructions:    Continue with your current medicines.   We will most likely be starting you on cholesterol medicine once I see your labs   If you need a refill on your cardiac medications before your next appointment, please call your pharmacy.     Testing/Procedures To Be Arranged:  Carotids doppler   Echocardiogram  Follow-Up:  See [place] back in about 4 months -  You will receive a reminder letter in the mail two months in advance. If you don't receive a letter, please call our office to schedule the follow-up appointment.    At [facility], you and your health needs are our priority.  As part of our continuing mission to provide you with exceptional heart care, we have created designated Provider Care Teams.  These Care Teams include your primary Cardiologist (physician) and Advanced Practice Providers (APPs -  Physician Assistants and Nurse Practitioners) who all work together to provide you with the care you need, when you need it.  Special Instructions:  . Stay safe, stay home, wash your hands for at least 20 seconds and wear a mask when out in public.  . It was good to talk with you today.  . Think about what we talked about today.    Call the [place] office at [phone] if you have any questions, problems or concerns.

## 2020-02-12 LAB — LIPID PANEL
Chol/HDL Ratio: 3.7 ratio (ref 0.0–4.4)
Cholesterol, Total: 160 mg/dL (ref 100–199)
HDL: 43 mg/dL (ref >39)
LDL Chol Calc (NIH): 86 mg/dL (ref 0–99)
Triglycerides: 181 mg/dL — ABNORMAL HIGH (ref 0–149)
VLDL Cholesterol Cal: 31 mg/dL (ref 5–40)

## 2020-02-12 LAB — HEPATIC FUNCTION PANEL
ALT: 22 IU/L (ref 0–32)
AST: 18 IU/L (ref 0–40)
Albumin: 4 g/dL (ref 3.8–4.9)
Alkaline Phosphatase: 86 IU/L (ref 39–117)
Bilirubin Total: 0.5 mg/dL (ref 0.0–1.2)
Bilirubin, Direct: 0.17 mg/dL (ref 0.00–0.40)
Total Protein: 6.8 g/dL (ref 6.0–8.5)

## 2020-02-12 LAB — HEMOGLOBIN A1C
Est. average glucose Bld gHb Est-mCnc: 183 mg/dL
Hgb A1c MFr Bld: 8 % — ABNORMAL HIGH (ref 4.8–5.6)

## 2020-02-13 ENCOUNTER — Other Ambulatory Visit: Payer: Self-pay

## 2020-02-13 ENCOUNTER — Ambulatory Visit
Admission: RE | Admit: 2020-02-13 | Discharge: 2020-02-13 | Disposition: A | Discharge status: Home / Self Care | Payer: Medicaid Other | Source: Ambulatory Visit | Attending: Internal Medicine | Admitting: Internal Medicine

## 2020-02-13 DIAGNOSIS — Z1231 Encounter for screening mammogram for malignant neoplasm of breast: Secondary | ICD-10-CM

## 2020-02-14 ENCOUNTER — Other Ambulatory Visit: Payer: Self-pay | Admitting: *Deleted

## 2020-02-14 ENCOUNTER — Telehealth: Payer: Self-pay | Admitting: Nurse Practitioner

## 2020-02-14 DIAGNOSIS — E782 Mixed hyperlipidemia: Secondary | ICD-10-CM

## 2020-02-14 MED ORDER — ATORVASTATIN CALCIUM 10 MG PO TABS: 10.0000 mg | ORAL_TABLET | Freq: Every day | ORAL | 3 refills | Status: DC

## 2020-02-14 NOTE — Telephone Encounter (Signed)
New message ° ° °Patient is returning call for lab results. Please call. °

## 2020-02-15 ENCOUNTER — Other Ambulatory Visit: Payer: Self-pay

## 2020-02-15 ENCOUNTER — Ambulatory Visit (HOSPITAL_COMMUNITY)
Admission: RE | Admit: 2020-02-15 | Discharge: 2020-02-15 | Disposition: A | Discharge status: Home / Self Care | Payer: Medicaid Other | Source: Ambulatory Visit | Attending: Cardiovascular Disease | Admitting: Cardiovascular Disease

## 2020-02-15 DIAGNOSIS — I340 Nonrheumatic mitral (valve) insufficiency: Secondary | ICD-10-CM | POA: Diagnosis not present

## 2020-02-15 DIAGNOSIS — E782 Mixed hyperlipidemia: Secondary | ICD-10-CM | POA: Diagnosis not present

## 2020-02-15 DIAGNOSIS — I5032 Chronic diastolic (congestive) heart failure: Secondary | ICD-10-CM | POA: Diagnosis not present

## 2020-02-15 DIAGNOSIS — H34219 Partial retinal artery occlusion, unspecified eye: Secondary | ICD-10-CM | POA: Diagnosis not present

## 2020-02-26 ENCOUNTER — Other Ambulatory Visit: Payer: Self-pay

## 2020-02-26 ENCOUNTER — Ambulatory Visit (HOSPITAL_COMMUNITY): Payer: Medicaid Other | Attending: Cardiovascular Disease

## 2020-02-26 DIAGNOSIS — H34219 Partial retinal artery occlusion, unspecified eye: Secondary | ICD-10-CM | POA: Insufficient documentation

## 2020-02-26 DIAGNOSIS — I340 Nonrheumatic mitral (valve) insufficiency: Secondary | ICD-10-CM | POA: Diagnosis not present

## 2020-02-26 DIAGNOSIS — I5032 Chronic diastolic (congestive) heart failure: Secondary | ICD-10-CM | POA: Diagnosis not present

## 2020-02-26 DIAGNOSIS — E782 Mixed hyperlipidemia: Secondary | ICD-10-CM | POA: Insufficient documentation

## 2020-03-24 ENCOUNTER — Other Ambulatory Visit: Payer: Self-pay

## 2020-03-24 ENCOUNTER — Other Ambulatory Visit: Payer: Medicaid Other

## 2020-03-24 DIAGNOSIS — E782 Mixed hyperlipidemia: Secondary | ICD-10-CM

## 2020-03-25 LAB — HEPATIC FUNCTION PANEL
ALT: 13 IU/L (ref 0–32)
AST: 13 IU/L (ref 0–40)
Albumin: 4.3 g/dL (ref 3.8–4.8)
Alkaline Phosphatase: 80 IU/L (ref 48–121)
Bilirubin Total: 0.3 mg/dL (ref 0.0–1.2)
Bilirubin, Direct: 0.12 mg/dL (ref 0.00–0.40)
Total Protein: 6.7 g/dL (ref 6.0–8.5)

## 2020-03-25 LAB — LIPID PANEL
Chol/HDL Ratio: 3.1 ratio (ref 0.0–4.4)
Cholesterol, Total: 147 mg/dL (ref 100–199)
HDL: 48 mg/dL (ref >39)
LDL Chol Calc (NIH): 81 mg/dL (ref 0–99)
Triglycerides: 96 mg/dL (ref 0–149)
VLDL Cholesterol Cal: 18 mg/dL (ref 5–40)

## 2020-04-04 ENCOUNTER — Ambulatory Visit: Payer: Medicaid Other | Attending: Internal Medicine | Admitting: Rehabilitative and Restorative Service Providers"

## 2020-04-04 ENCOUNTER — Encounter: Payer: Self-pay | Admitting: Rehabilitative and Restorative Service Providers"

## 2020-04-04 ENCOUNTER — Other Ambulatory Visit: Payer: Self-pay

## 2020-04-04 DIAGNOSIS — M6281 Muscle weakness (generalized): Secondary | ICD-10-CM

## 2020-04-04 DIAGNOSIS — G8929 Other chronic pain: Secondary | ICD-10-CM | POA: Insufficient documentation

## 2020-04-04 DIAGNOSIS — R262 Difficulty in walking, not elsewhere classified: Secondary | ICD-10-CM | POA: Diagnosis not present

## 2020-04-04 DIAGNOSIS — M545 Low back pain: Secondary | ICD-10-CM | POA: Diagnosis present

## 2020-04-04 NOTE — Patient Instructions (Signed)
Access Code: [email or web address] URL: [email or web address] Date: 04/04/2020 Prepared by: Vista Mink  Exercises Standing Scapular Retraction - 5-10 x daily - 7 x weekly - 1 sets - 5 reps - 5 hold Standing Lumbar Extension at [place] - 5-10 x daily - 7 x weekly - 1 sets - 5 reps - 3 hold Heel Toe Raises with Counter Support - 2-3 x daily - 7 x weekly - 1 sets - 10 reps - 3 seconds hold Sit to Stand without Arm Support - 2 x daily - 7 x weekly - 1 sets - 5 reps

## 2020-04-04 NOTE — Therapy (Signed)
[place], [place], [address] Phone: [phone]   Fax:  [phone]  Physical Therapy Evaluation  Patient Details  Name: Kathleen Kane MRN: [id number] Date of Birth: 03-25-1961 Referring Provider (PT): Girard Cooter   Encounter Date: 04/04/2020  PT End of Session - 04/04/20 1033    Visit Number  1    Number of Visits  12    Date for PT Re-Evaluation  05/30/20    PT Start Time  0916    PT Stop Time  1000    PT Time Calculation (min)  44 min    Activity Tolerance  Patient limited by fatigue;No increased pain    Behavior During Therapy  WFL for tasks assessed/performed       Past Medical History:  Diagnosis Date  . Acute on chronic diastolic CHF (congestive heart failure) ([place]) 08/08/2015  . Anxiety   . Atrial fibrillation, persistent ([place])   . Carpal tunnel syndrome, right 12/15/2016  . CHF (congestive heart failure) ([place]) 2013   No echo found from that time, most likely diastolic  . CHF exacerbation ([place]) 10/01/2018  . Depression   . Diabetes mellitus ([place]) 08/10/2012  . Diabetes mellitus without complication ([place])   . Hypertension   . 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 Morbid obesity ([place]) 05/06/2015  . Obesity   . Obstructive sleep apnea    Sleep study performed 10/18/2008. AHI-6.8/hr, during REM-27.3/hr. RDI-25.0/hr, during REM-40.9/hr. avg o2 sat during REM and NREM 95%  . OSA (obstructive sleep apnea) 02/10/2017  . Pelvic mass in female 01/29/2013   With ascites   . Pneumonia 10/02/2018  . Rheumatoid arthritis ([place])   . Rheumatoid arthritis, 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[place]) 10/08/2015    Past Surgical History:  Procedure Laterality Date  . CARDIOVASCULAR STRESS TEST  02/16/2013   no significant EKG changes with Lexiscan, normal LV function and normal wall function  . CESAREAN SECTION     x2  . DOPPLER ECHOCARDIOGRAPHY  01/24/2008   EF 55-60%, no diagnostic  evidence of LV wall motion abnormalities, LV wall thickness was mild-moderately increased.  Marland Kitchen LAPAROTOMY Right 02/27/2013   Procedure: EXPLORATORY LAPAROTOMY, right salpingo-oopherectomy;  Surgeon: [name], MD;  Location: [facility];  Service: Gynecology;  Laterality: Right;  . left breast cyst    . SALPINGOOPHORECTOMY Right 02/27/2013   Procedure: SALPINGO OOPHORECTOMY;  Surgeon: [name], MD;  Location: [facility];  Service: Gynecology;  Laterality: Right;    There were no vitals filed for this visit.   Subjective Assessment - 04/04/20 0918    Subjective  Kathleen Kane notes low back, gluteal and upper lateral thigh pain.    Limitations  Sitting;Lifting;Standing;Walking    How long can you sit comfortably?  1 hour    How long can you stand comfortably?  < 5 minutes    How long can you walk comfortably?  < 5 minutes    Patient Stated Goals  Decrease pain and improve gait quality and weight-bearing function    Currently in Pain?  Yes    Pain Score  4     Pain Location  Back    Pain Orientation  Lower    Pain Descriptors / Indicators  Aching    Pain Type  Chronic pain    Pain Radiating Towards  Gluteals and upper lateral thighs    Pain Onset  More than a month ago    Pain Frequency  Constant  Aggravating Factors   Weight-bearing    Pain Relieving Factors  Sitting    Effect of Pain on Daily Activities  Doesn't cook, clean or do normal household chores.         [facility] PT Assessment - 04/04/20 0001      Assessment   Medical Diagnosis  Low back pain    Referring Provider (PT)  Kathleen Kane    Onset Date/Surgical Date  11/09/19      Balance Screen   Has the patient fallen in the past 6 months  No    Has the patient had a decrease in activity level because of a fear of falling?   No    Is the patient reluctant to leave their home because of a fear of falling?   No      Home Social worker  Private residence    Living Arrangements  Spouse/significant  other      Prior Function   Level of Independence  Independent      Cognition   Overall Cognitive Status  Within Functional Limits for tasks assessed      Posture/Postural Control   Posture/Postural Control  Postural limitations    Postural Limitations  Rounded Shoulders;Forward head;Decreased lumbar lordosis      ROM / Strength   AROM / PROM / Strength  AROM      AROM   AROM Assessment Site  Lumbar    Lumbar Extension  -20      Ambulation/Gait   Ambulation/Gait  Yes    Ambulation/Gait Assistance  6: Modified independent (Device/Increase time)    Ambulation Distance (Feet)  30 Feet    Assistive device  Rolling walker    Gait Pattern  Decreased stance time - right    Ambulation Surface  Level                  Objective measurements completed on examination: See above findings.              PT Education - 04/04/20 1030    Education Details  Discussed the importance of posture, negatives of prolonged sitting [place] sits most of the day), the importance of short, frequent breaks to rest and stretch (change position), spine anatomy and the importance of walking short distances throughout the day (limited by RA).    Person(s) Educated  Patient    Methods  Explanation;Demonstration;Tactile cues;Verbal cues;Handout    Comprehension  Verbalized understanding;Tactile cues required;Need further instruction;Returned demonstration;Verbal cues required       PT Short Term Goals - 04/04/20 1055      PT SHORT TERM GOAL #1   Title  Kathleen Kane will be independent with her starter HEP.    Time  2    Period  Weeks    Status  New    Target Date  04/18/20        PT Long Term Goals - 04/04/20 1106      PT LONG TERM GOAL #1   Title  Kathleen Kane will report low back pain consistently 0-3/10 on the Numeric Pain Rating Scale.    Baseline  As high as 5/10.    Time  8    Period  Weeks    Status  New    Target Date  05/30/20      PT LONG TERM GOAL #2   Title  Kathleen Kane will be  able to return to helping her husband with household chores at [place].  Baseline  Unable to help with any household chores.    Time  8    Period  Weeks    Status  New    Target Date  05/30/20      PT LONG TERM GOAL #3   Title  Kathleen Kane will be able to stand upright due to improved hip flexors AROM, trunk extension AROM and core strength.    Baseline  Can only stand short periods of time in a 20 degree flexed posture.    Time  8    Period  Weeks    Status  New    Target Date  05/30/20      PT LONG TERM GOAL #4   Title  Kathleen Kane will be independent with her final long-term HEP at [place].    Time  8    Period  Weeks    Status  New    Target Date  05/30/20             Plan - 04/04/20 1000    Clinical Impression Statement  Kathleen Kane is deconditioned and spends a large percentage of her day sitting.  Her hip flexors are very tight as a result thus she stands in a flexed posture which increases strain on her low back.  Physical therapy will address tight anterior hip musculature, core and lower extremity weakness, general conditioning and postural awareness/body mechanics to return Kathleen Kane to her previous level of function where she was able to help her husband with household chores (unable to now).    Personal Factors and Comorbidities  Comorbidity 3+    Comorbidities  CHF, atrial fibrillation, CHF, diabetes, HTN, RA, obesity, R carpal tunnel    Examination-Activity Limitations  Bathing;Dressing;Sit;Transfers;Bed Mobility;Hygiene/Grooming;Bend;Lift;Squat;Locomotion Level;Stairs;Carry;Reach Overhead;Stand    Examination-Participation Restrictions  Interpersonal Relationship;Yard Work;Laundry;Cleaning;Community Activity;Meal Prep    Clinical Decision Making  Moderate    Rehab Potential  Good    PT Frequency  2x / week    PT Duration  6 weeks    PT Treatment/Interventions  ADLs/Self Care Home Management;Cryotherapy;Therapeutic activities;Functional mobility training;Gait training;Therapeutic  exercise;Neuromuscular re-education;Patient/family education    PT Next Visit Plan  General strengthening and conditioning, postural work, Dietitian, spine education, work on hip flexors and trunk extension AROM limitations and core strength.    PT Home Exercise Plan  Access Code: [email or web address]    Consulted and Agree with Plan of Care  Patient       Patient will benefit from skilled therapeutic intervention in order to improve the following deficits and impairments:  Abnormal gait, Cardiopulmonary status limiting activity, Decreased activity tolerance, Decreased endurance, Decreased range of motion, Decreased mobility, Decreased strength, Difficulty walking, Hypomobility, Postural dysfunction, Improper body mechanics, Obesity, Pain  Visit Diagnosis: Difficulty in walking, not elsewhere classified  Chronic bilateral low back pain without sciatica  Muscle weakness (generalized)     Problem List Patient Active Problem List   Diagnosis Date Noted  . HCAP (healthcare-associated pneumonia) 10/02/2018  . CHF exacerbation ([place]) 10/01/2018  . Community acquired pneumonia 10/01/2018  . OSA (obstructive sleep apnea) 02/10/2017  . Carpal tunnel syndrome, right 12/15/2016  . Extensor tenosynovitis of wrist, right 12/15/2016  . Bilateral primary osteoarthritis of knee 10/08/2015  . Rheumatoid arthritis, 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[place]) 10/08/2015  . Acute on chronic diastolic CHF (congestive heart failure) ([place]) 08/08/2015  . Morbid obesity ([place]) 05/06/2015  . Essential hypertension 04/10/2015  . 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 Primary osteoarthritis involving multiple joints 04/09/2015  .  Elevated uric acid in blood 01/23/2014  . High risk medication use 01/23/2014  . Numbness and tingling in left hand 10/01/2013  . Tobacco abuse 10/01/2013  . Elevated CA-125 02/06/2013  . Pelvic mass in female 01/29/2013  . Diabetes mellitus ([employer]) 08/10/2012  . Atrial  fibrillation ([place])   . Chronic diastolic CHF (congestive heart failure) ([place])     Farley Ly PT, MPT 04/04/2020, 11:12 AM  [place], [place], [address] Phone: [phone]   Fax:  [phone]  Name: Kathleen Kane MRN: [id number] Date of Birth: 24-Oct-1961

## 2020-04-16 ENCOUNTER — Ambulatory Visit: Payer: Medicaid Other | Admitting: Physical Therapy

## 2020-04-18 ENCOUNTER — Ambulatory Visit: Payer: Medicaid Other | Admitting: Physical Therapy

## 2020-04-21 ENCOUNTER — Ambulatory Visit: Payer: Medicaid Other | Attending: Internal Medicine | Admitting: Physical Therapy

## 2020-04-21 ENCOUNTER — Other Ambulatory Visit: Payer: Self-pay

## 2020-04-21 DIAGNOSIS — G8929 Other chronic pain: Secondary | ICD-10-CM | POA: Insufficient documentation

## 2020-04-21 DIAGNOSIS — M6281 Muscle weakness (generalized): Secondary | ICD-10-CM

## 2020-04-21 DIAGNOSIS — R262 Difficulty in walking, not elsewhere classified: Secondary | ICD-10-CM

## 2020-04-21 DIAGNOSIS — M545 Low back pain, unspecified: Secondary | ICD-10-CM

## 2020-04-21 NOTE — Therapy (Signed)
[place], [place], [address] Phone: [phone]   Fax:  [phone]  Physical Therapy Treatment  Patient Details  Name: Kathleen Kane MRN: [id number] Date of Birth: Apr 15, 1961 Referring Provider (PT): Girard Cooter   Encounter Date: 04/21/2020   PT End of Session - 04/21/20 1138    Visit Number 2    Number of Visits 12    Date for PT Re-Evaluation 05/30/20    PT Start Time 1055    PT Stop Time 1142    PT Time Calculation (min) 47 min    Activity Tolerance Patient limited by fatigue;No increased pain    Behavior During Therapy WFL for tasks assessed/performed           Past Medical History:  Diagnosis Date  . Acute on chronic diastolic CHF (congestive heart failure) ([place]) 08/08/2015  . Anxiety   . Atrial fibrillation, persistent ([place])   . Carpal tunnel syndrome, right 12/15/2016  . CHF (congestive heart failure) ([place]) 2013   No echo found from that time, most likely diastolic  . CHF exacerbation ([place]) 10/01/2018  . Depression   . Diabetes mellitus ([place]) 08/10/2012  . Diabetes mellitus without complication ([place])   . Hypertension   . 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 Morbid obesity ([place]) 05/06/2015  . Obesity   . Obstructive sleep apnea    Sleep study performed 10/18/2008. AHI-6.8/hr, during REM-27.3/hr. RDI-25.0/hr, during REM-40.9/hr. avg o2 sat during REM and NREM 95%  . OSA (obstructive sleep apnea) 02/10/2017  . Pelvic mass in female 01/29/2013   With ascites   . Pneumonia 10/02/2018  . Rheumatoid arthritis ([place])   . Rheumatoid arthritis, 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[place]) 10/08/2015    Past Surgical History:  Procedure Laterality Date  . CARDIOVASCULAR STRESS TEST  02/16/2013   no significant EKG changes with Lexiscan, normal LV function and normal wall function  . CESAREAN SECTION     x2  . DOPPLER ECHOCARDIOGRAPHY  01/24/2008   EF 55-60%, no diagnostic evidence  of LV wall motion abnormalities, LV wall thickness was mild-moderately increased.  Marland Kitchen LAPAROTOMY Right 02/27/2013   Procedure: EXPLORATORY LAPAROTOMY, right salpingo-oopherectomy;  Surgeon: [name], MD;  Location: [facility];  Service: Gynecology;  Laterality: Right;  . left breast cyst    . SALPINGOOPHORECTOMY Right 02/27/2013   Procedure: SALPINGO OOPHORECTOMY;  Surgeon: [name], MD;  Location: [facility];  Service: Gynecology;  Laterality: Right;    There were no vitals filed for this visit.   Subjective Assessment - 04/21/20 1058    Subjective Pt reports hard time getting up this morning. Pt states she had to go on a long trip out of town and aggravated her back.    Limitations Sitting;Lifting;Standing;Walking    How long can you sit comfortably? 1 hour    How long can you stand comfortably? < 5 minutes    How long can you walk comfortably? < 5 minutes    Patient Stated Goals Decrease pain and improve gait quality and weight-bearing function    Currently in Pain? Yes    Pain Score 6     Pain Location Back    Pain Descriptors / Indicators Aching    Pain Type Chronic pain    Pain Radiating Towards buttocks                             [facility] Adult PT Treatment/Exercise -  04/21/20 0001      Lumbar Exercises: Stretches   Hip Flexor Stretch 30 seconds;Right;Left    Pelvic Tilt 10 reps      Lumbar Exercises: Aerobic   Stationary Bike 2 min      Lumbar Exercises: Standing   Other Standing Lumbar Exercises sit<>stand x 5 reps with 5 sec hold for glute activation and scapular retraction      Lumbar Exercises: Supine   Clam 10 reps;2 seconds    Bridge 10 reps    Other Supine Lumbar Exercises clam shell x 10 reps    Other Supine Lumbar Exercises Standing Lumbar Extension at Wall - Forearms 5 reps x 5 sec hold      Modalities   Modalities Electrical Stimulation;Moist Heat      Moist Heat Therapy   Number Minutes Moist Heat 10 Minutes    Moist Heat Location  Lumbar Spine   sacroiliac area     Electrical Stimulation   Electrical Stimulation Location Sacroiliac area    Electrical Stimulation Action IFC    Electrical Stimulation Parameters to pt tolerance    Electrical Stimulation Goals Pain      Manual Therapy   Manual Therapy Joint mobilization    Joint Mobilization thoracic and lumbar grade II to III PA mobs                  PT Education - 04/21/20 1137    Education Details Reinforced importance of short walks with rollator throughout the day to stretch hip    Person(s) Educated Patient    Methods Explanation;Demonstration;Tactile cues;Verbal cues;Handout    Comprehension Verbalized understanding;Tactile cues required            PT Short Term Goals - 04/04/20 1055      PT SHORT TERM GOAL #1   Title Kathleen Kane will be independent with her starter HEP.    Time 2    Period Weeks    Status New    Target Date 04/18/20             PT Long Term Goals - 04/04/20 1106      PT LONG TERM GOAL #1   Title Kathleen Kane will report low back pain consistently 0-3/10 on the Numeric Pain Rating Scale.    Baseline As high as 5/10.    Time 8    Period Weeks    Status New    Target Date 05/30/20      PT LONG TERM GOAL #2   Title Kathleen Kane will be able to return to helping her husband with [place] chores at [place].    Baseline Unable to help with any household chores.    Time 8    Period Weeks    Status New    Target Date 05/30/20      PT LONG TERM GOAL #3   Title Kathleen Kane will be able to stand upright due to improved hip flexors AROM, trunk extension AROM and core strength.    Baseline Can only stand short periods of time in a 20 degree flexed posture.    Time 8    Period Weeks    Status New    Target Date 05/30/20      PT LONG TERM GOAL #4   Title Kathleen Kane will be independent with her final long-term HEP at [place].    Time 8    Period Weeks    Status New    Target Date 05/30/20  Plan - 04/21/20 1138    Clinical  Impression Statement Treatment focused on initiating and progressing core and hip strengthening. Pt educated and provided stretching for hip flexors.    Personal Factors and Comorbidities Comorbidity 3+    Comorbidities CHF, atrial fibrillation, CHF, diabetes, HTN, RA, obesity, R carpal tunnel    Examination-Activity Limitations Bathing;Dressing;Sit;Transfers;Bed Mobility;Hygiene/Grooming;Bend;Lift;Squat;Locomotion Level;Stairs;Carry;Reach Overhead;Stand    Examination-Participation Restrictions Interpersonal Relationship;Yard Work;Laundry;Cleaning;Community Activity;Meal Prep    Clinical Decision Making Moderate    Rehab Potential Good    PT Frequency 2x / week    PT Duration 6 weeks    PT Treatment/Interventions ADLs/Self Care Home Management;Cryotherapy;Therapeutic activities;Functional mobility training;Gait training;Therapeutic exercise;Neuromuscular re-education;Patient/family education    PT Next Visit Plan General strengthening and conditioning, postural work, Dietitian, spine education, work on hip flexors and trunk extension AROM limitations and core strength.    PT Home Exercise Plan Access Code: [email or web address]    Consulted and Agree with Plan of Care Patient           Patient will benefit from skilled therapeutic intervention in order to improve the following deficits and impairments:  Abnormal gait, Cardiopulmonary status limiting activity, Decreased activity tolerance, Decreased endurance, Decreased range of motion, Decreased mobility, Decreased strength, Difficulty walking, Hypomobility, Postural dysfunction, Improper body mechanics, Obesity, Pain  Visit Diagnosis: Difficulty in walking, not elsewhere classified  Chronic bilateral low back pain without sciatica  Muscle weakness (generalized)     Problem List Patient Active Problem List   Diagnosis Date Noted  . HCAP (healthcare-associated pneumonia) 10/02/2018  . CHF exacerbation ([place]) 10/01/2018  . Community  acquired pneumonia 10/01/2018  . OSA (obstructive sleep apnea) 02/10/2017  . Carpal tunnel syndrome, right 12/15/2016  . Extensor tenosynovitis of wrist, right 12/15/2016  . Bilateral primary osteoarthritis of knee 10/08/2015  . Rheumatoid arthritis, 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[place]) 10/08/2015  . Acute on chronic diastolic CHF (congestive heart failure) ([place]) 08/08/2015  . Morbid obesity ([place]) 05/06/2015  . Essential hypertension 04/10/2015  . 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 Primary osteoarthritis involving multiple joints 04/09/2015  . Elevated uric acid in blood 01/23/2014  . High risk medication use 01/23/2014  . Numbness and tingling in left hand 10/01/2013  . Tobacco abuse 10/01/2013  . Elevated CA-125 02/06/2013  . Pelvic mass in female 01/29/2013  . Diabetes mellitus ([place]) 08/10/2012  . Atrial fibrillation ([place])   . Chronic diastolic CHF (congestive heart failure) [facility])     Dawood Spitler April Ma L Quin Mcpherson PT, DPT 04/21/2020, 11:40 AM  [facility] [address] [place], [place], [address] Phone: [phone]   Fax:  [phone]  Name: Kathleen Kane MRN: [id number] Date of Birth: 1961/08/05

## 2020-04-22 ENCOUNTER — Ambulatory Visit: Payer: Medicaid Other | Admitting: Podiatry

## 2020-04-23 ENCOUNTER — Ambulatory Visit: Payer: Medicaid Other | Admitting: Physical Therapy

## 2020-04-23 ENCOUNTER — Other Ambulatory Visit: Payer: Self-pay

## 2020-04-23 ENCOUNTER — Encounter: Payer: Self-pay | Admitting: Physical Therapy

## 2020-04-23 DIAGNOSIS — M6281 Muscle weakness (generalized): Secondary | ICD-10-CM

## 2020-04-23 DIAGNOSIS — R262 Difficulty in walking, not elsewhere classified: Secondary | ICD-10-CM

## 2020-04-23 DIAGNOSIS — G8929 Other chronic pain: Secondary | ICD-10-CM

## 2020-04-23 NOTE — Therapy (Signed)
[place], [place], [address] Phone: [phone]   Fax:  [phone]  Physical Therapy Treatment  Patient Details  Name: Kathleen Kane MRN: [id number] Date of Birth: 07-31-1961 Referring Provider (PT): Girard Cooter   Encounter Date: 04/23/2020   PT End of Session - 04/23/20 1008    Visit Number 3    Number of Visits 12    Date for PT Re-Evaluation 05/30/20    PT Start Time 1009    PT Stop Time 1245    PT Time Calculation (min) 38 min    Activity Tolerance Patient limited by fatigue;No increased pain    Behavior During Therapy WFL for tasks assessed/performed           Past Medical History:  Diagnosis Date  . Acute on chronic diastolic CHF (congestive heart failure) ([place]) 08/08/2015  . Anxiety   . Atrial fibrillation, persistent ([place])   . Carpal tunnel syndrome, right 12/15/2016  . CHF (congestive heart failure) ([place]) 2013   No echo found from that time, most likely diastolic  . CHF exacerbation ([place]) 10/01/2018  . Depression   . Diabetes mellitus ([place]) 08/10/2012  . Diabetes mellitus without complication ([place])   . Hypertension   . 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 Morbid obesity ([place]) 05/06/2015  . Obesity   . Obstructive sleep apnea    Sleep study performed 10/18/2008. AHI-6.8/hr, during REM-27.3/hr. RDI-25.0/hr, during REM-40.9/hr. avg o2 sat during REM and NREM 95%  . OSA (obstructive sleep apnea) 02/10/2017  . Pelvic mass in female 01/29/2013   With ascites   . Pneumonia 10/02/2018  . Rheumatoid arthritis ([place])   . Rheumatoid arthritis, 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[place]) 10/08/2015    Past Surgical History:  Procedure Laterality Date  . CARDIOVASCULAR STRESS TEST  02/16/2013   no significant EKG changes with Lexiscan, normal LV function and normal wall function  . CESAREAN SECTION     x2  . DOPPLER ECHOCARDIOGRAPHY  01/24/2008   EF 55-60%, no diagnostic evidence  of LV wall motion abnormalities, LV wall thickness was mild-moderately increased.  Marland Kitchen LAPAROTOMY Right 02/27/2013   Procedure: EXPLORATORY LAPAROTOMY, right salpingo-oopherectomy;  Surgeon: [name], MD;  Location: [facility];  Service: Gynecology;  Laterality: Right;  . left breast cyst    . SALPINGOOPHORECTOMY Right 02/27/2013   Procedure: SALPINGO OOPHORECTOMY;  Surgeon: [name], MD;  Location: [facility];  Service: Gynecology;  Laterality: Right;    There were no vitals filed for this visit.                      [facility] Adult PT Treatment/Exercise - 04/23/20 0001      Lumbar Exercises: Stretches   Hip Flexor Stretch Right;Left;30 seconds    Prone on Elbows Stretch 30 seconds    Quad Stretch 30 seconds;Right;Left      Lumbar Exercises: Standing   Other Standing Lumbar Exercises sit<>stand x 7 reps      Lumbar Exercises: Seated   Other Seated Lumbar Exercises Scapular retraction x 10 reps with 2 sec hold    Other Seated Lumbar Exercises Cervical retraction x10 with 2 sec hold      Lumbar Exercises: Supine   Bridge 10 reps    Straight Leg Raise 10 reps      Lumbar Exercises: Prone   Other Prone Lumbar Exercises knee bent hip extension x 10 reps bilat      Knee/Hip Exercises: Standing  Hip Abduction Stengthening;Both;10 reps    Hip Extension Stengthening;Both;10 reps      Manual Therapy   Manual Therapy Joint mobilization;Soft tissue mobilization    Joint Mobilization thoracic and lumbar grade II to III PA mobs    Soft tissue mobilization thoracolumbar paraspinals                    PT Short Term Goals - 04/04/20 1055      PT SHORT TERM GOAL #1   Title Myria will be independent with her starter HEP.    Time 2    Period Weeks    Status New    Target Date 04/18/20             PT Long Term Goals - 04/04/20 1106      PT LONG TERM GOAL #1   Title Rayna will report low back pain consistently 0-3/10 on the Numeric Pain Rating Scale.     Baseline As high as 5/10.    Time 8    Period Weeks    Status New    Target Date 05/30/20      PT LONG TERM GOAL #2   Title Dayrin will be able to return to helping her husband with [place] chores at [place].    Baseline Unable to help with any household chores.    Time 8    Period Weeks    Status New    Target Date 05/30/20      PT LONG TERM GOAL #3   Title Genora will be able to stand upright due to improved hip flexors AROM, trunk extension AROM and core strength.    Baseline Can only stand short periods of time in a 20 degree flexed posture.    Time 8    Period Weeks    Status New    Target Date 05/30/20      PT LONG TERM GOAL #4   Title Silvanna will be independent with her final long-term HEP at [place].    Time 8    Period Weeks    Status New    Target Date 05/30/20                 Plan - 04/23/20 1049    Clinical Impression Statement Treatment focused on increasing core and hip strengthening and neuromuscular re-education for posture. Continued education on posture in sitting and standing. Pt is progressing well    Personal Factors and Comorbidities Comorbidity 3+    Comorbidities CHF, atrial fibrillation, CHF, diabetes, HTN, RA, obesity, R carpal tunnel    Examination-Activity Limitations Bathing;Dressing;Sit;Transfers;Bed Mobility;Hygiene/Grooming;Bend;Lift;Squat;Locomotion Level;Stairs;Carry;Reach Overhead;Stand    Examination-Participation Restrictions Interpersonal Relationship;Yard Work;Laundry;Cleaning;Community Activity;Meal Prep    Rehab Potential Good    PT Frequency 2x / week    PT Duration 6 weeks    PT Treatment/Interventions ADLs/Self Care Home Management;Cryotherapy;Therapeutic activities;Functional mobility training;Gait training;Therapeutic exercise;Neuromuscular re-education;Patient/family education    PT Next Visit Plan General strengthening and conditioning, postural work, Dietitian, spine education, work on hip flexors and trunk extension  AROM limitations and core strength.    PT Home Exercise Plan Access Code: [email or web address]    Consulted and Agree with Plan of Care Patient           Patient will benefit from skilled therapeutic intervention in order to improve the following deficits and impairments:  Abnormal gait, Cardiopulmonary status limiting activity, Decreased activity tolerance, Decreased endurance, Decreased range of motion, Decreased mobility, Decreased strength, Difficulty walking, Hypomobility, Postural  dysfunction, Improper body mechanics, Obesity, Pain  Visit Diagnosis: Difficulty in walking, not elsewhere classified  Chronic bilateral low back pain without sciatica  Muscle weakness (generalized)     Problem List Patient Active Problem List   Diagnosis Date Noted  . HCAP (healthcare-associated pneumonia) 10/02/2018  . CHF exacerbation ([place]) 10/01/2018  . Community acquired pneumonia 10/01/2018  . OSA (obstructive sleep apnea) 02/10/2017  . Carpal tunnel syndrome, right 12/15/2016  . Extensor tenosynovitis of wrist, right 12/15/2016  . Bilateral primary osteoarthritis of knee 10/08/2015  . Rheumatoid arthritis, 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[place]) 10/08/2015  . Acute on chronic diastolic CHF (congestive heart failure) ([place]) 08/08/2015  . Morbid obesity ([place]) 05/06/2015  . Essential hypertension 04/10/2015  . 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 Primary osteoarthritis involving multiple joints 04/09/2015  . Elevated uric acid in blood 01/23/2014  . High risk medication use 01/23/2014  . Numbness and tingling in left hand 10/01/2013  . Tobacco abuse 10/01/2013  . Elevated CA-125 02/06/2013  . Pelvic mass in female 01/29/2013  . Diabetes mellitus ([place]) 08/10/2012  . Atrial fibrillation ([place])   . Chronic diastolic CHF (congestive heart failure) [facility])     Constant [place] April Ma L Paddy Neis PT, DPT 04/23/2020, 3:03 PM  [facility] [address] [place], [place], [address] Phone: [phone]   Fax:  [phone]  Name: YEHUDIS MONCEAUX MRN: [id number] Date of Birth: 1961/08/01

## 2020-04-28 ENCOUNTER — Other Ambulatory Visit: Payer: Self-pay

## 2020-04-28 ENCOUNTER — Ambulatory Visit: Payer: Medicaid Other | Admitting: Physical Therapy

## 2020-04-28 DIAGNOSIS — R262 Difficulty in walking, not elsewhere classified: Secondary | ICD-10-CM

## 2020-04-28 DIAGNOSIS — M6281 Muscle weakness (generalized): Secondary | ICD-10-CM

## 2020-04-28 DIAGNOSIS — M545 Low back pain, unspecified: Secondary | ICD-10-CM

## 2020-04-28 NOTE — Therapy (Signed)
[place], [place], [address] Phone: [phone]   Fax:  [phone]  Physical Therapy Treatment  Patient Details  Name: Kathleen Kane MRN: [id number] Date of Birth: February 07, 1961 Referring Provider (PT): Girard Cooter   Encounter Date: 04/28/2020   PT End of Session - 04/28/20 0957    Visit Number 4    Number of Visits 12    Date for PT Re-Evaluation 05/30/20    Authorization Type Re-auth requested    PT Start Time 0958    PT Stop Time 1050    PT Time Calculation (min) 52 min    Activity Tolerance Patient limited by fatigue;No increased pain    Behavior During Therapy WFL for tasks assessed/performed           Past Medical History:  Diagnosis Date  . Acute on chronic diastolic CHF (congestive heart failure) ([place]) 08/08/2015  . Anxiety   . Atrial fibrillation, persistent ([place])   . Carpal tunnel syndrome, right 12/15/2016  . CHF (congestive heart failure) ([place]) 2013   No echo found from that time, most likely diastolic  . CHF exacerbation ([place]) 10/01/2018  . Depression   . Diabetes mellitus ([place]) 08/10/2012  . Diabetes mellitus without complication ([place])   . Hypertension   . 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 Morbid obesity ([place]) 05/06/2015  . Obesity   . Obstructive sleep apnea    Sleep study performed 10/18/2008. AHI-6.8/hr, during REM-27.3/hr. RDI-25.0/hr, during REM-40.9/hr. avg o2 sat during REM and NREM 95%  . OSA (obstructive sleep apnea) 02/10/2017  . Pelvic mass in female 01/29/2013   With ascites   . Pneumonia 10/02/2018  . Rheumatoid arthritis ([place])   . Rheumatoid arthritis, 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[place]) 10/08/2015    Past Surgical History:  Procedure Laterality Date  . CARDIOVASCULAR STRESS TEST  02/16/2013   no significant EKG changes with Lexiscan, normal LV function and normal wall function  . CESAREAN SECTION     x2  . DOPPLER ECHOCARDIOGRAPHY   01/24/2008   EF 55-60%, no diagnostic evidence of LV wall motion abnormalities, LV wall thickness was mild-moderately increased.  Marland Kitchen LAPAROTOMY Right 02/27/2013   Procedure: EXPLORATORY LAPAROTOMY, right salpingo-oopherectomy;  Surgeon: [name], MD;  Location: [facility];  Service: Gynecology;  Laterality: Right;  . left breast cyst    . SALPINGOOPHORECTOMY Right 02/27/2013   Procedure: SALPINGO OOPHORECTOMY;  Surgeon: [name], MD;  Location: [facility];  Service: Gynecology;  Laterality: Right;    There were no vitals filed for this visit.   Subjective Assessment - 04/28/20 1000    Subjective Pt states she still feels some pain in the back but mostly reports increased knee pain today. Pt notes she has RA. Pt states she is getting a shot for the RA and medication to help with this.    Limitations Sitting;Lifting;Standing;Walking    How long can you sit comfortably? 1 hour    How long can you stand comfortably? < 5 minutes    How long can you walk comfortably? < 5 minutes    Patient Stated Goals Decrease pain and improve gait quality and weight-bearing function    Currently in Pain? Yes    Pain Score 5     Pain Location Back    Pain Descriptors / Indicators Aching    Pain Type Chronic pain    Multiple Pain Sites Yes    Pain Score 6    Pain Location Knee  Pain Orientation Right;Left    Pain Descriptors / Indicators Sharp    Pain Type Chronic pain                             [facility] Adult PT Treatment/Exercise - 04/28/20 0001      Lumbar Exercises: Stretches   Hip Flexor Stretch Right;Left;30 seconds    Prone on Elbows Stretch 30 seconds    Quad Stretch Right;Left;30 seconds      Lumbar Exercises: Aerobic   Nustep L5 x 6 min      Lumbar Exercises: Supine   Dead Bug 10 reps    Bridge 10 reps      Lumbar Exercises: Prone   Straight Leg Raise 10 reps      Knee/Hip Exercises: Standing   Hip Abduction Stengthening;Both;10 reps   red tband   Hip Extension  Stengthening;Both;10 reps   red tband     Knee/Hip Exercises: Seated   Sit to Sand 10 reps;without UE support   36 sec     Modalities   Modalities Electrical Stimulation      Electrical Stimulation   Electrical Stimulation Location lower lumbar/sacrum    Electrical Stimulation Action IFC    Electrical Stimulation Parameters to pt tolerance    Electrical Stimulation Goals Pain      Manual Therapy   Manual Therapy Joint mobilization;Soft tissue mobilization    Joint Mobilization thoracic and lumbar grade II to III PA mobs    Soft tissue mobilization lumabar and thoacolumbar paraspinals                    PT Short Term Goals - 04/28/20 1049      PT SHORT TERM GOAL #1   Title Kathleen Kane will be independent with her starter HEP.    Time 2    Period Weeks    Status Partially Met    Target Date 04/18/20             PT Long Term Goals - 04/28/20 1050      PT LONG TERM GOAL #1   Title Kathleen Kane will report low back pain consistently 0-3/10 on the Numeric Pain Rating Scale.    Baseline As high as 5/10.    Time 8    Period Weeks    Status On-going      PT LONG TERM GOAL #2   Title Kathleen Kane will be able to return to helping her husband with [place] chores at [place].    Baseline Unable to help with any household chores.    Time 8    Period Weeks    Status On-going      PT LONG TERM GOAL #3   Title Kathleen Kane will be able to stand upright due to improved hip flexors AROM, trunk extension AROM and core strength.    Baseline Can only stand short periods of time; however, demonstrates 10 deg flexed posture    Time 8    Period Weeks    Status Partially Met      PT LONG TERM GOAL #4   Title Kathleen Kane will be independent with her final long-term HEP at [place].    Time 8    Period Weeks    Status On-going      PT LONG TERM GOAL #5   Title Pt wants to be able to amb short distance (~10') with LRAD    Baseline Unable without increased pain and poor  gait mechanics    Time 6    Period Weeks     Status New    Target Date 06/09/20                 Plan - 04/28/20 1047    Clinical Impression Statement Pt continues to demonstrate slow increase in strength with improving upright posture during amb. Pt with improved education on maintaining upright posture in sitting and standing. Treatment focused on continuing to progress hip and core strengthening for low back and chronic knee pain. Pt reports some improvement with tolerating standing. Her RA continues to be the biggest limiting factor. Pt will continue to benefit from therapy services to meet her goals.    Personal Factors and Comorbidities Comorbidity 3+    Comorbidities CHF, atrial fibrillation, CHF, diabetes, HTN, RA, obesity, R carpal tunnel    Examination-Activity Limitations Bathing;Dressing;Sit;Transfers;Bed Mobility;Hygiene/Grooming;Bend;Lift;Squat;Locomotion Level;Stairs;Carry;Reach Overhead;Stand    Examination-Participation Restrictions Interpersonal Relationship;Yard Work;Laundry;Cleaning;Community Activity;Meal Prep    Rehab Potential Good    PT Frequency 2x / week    PT Duration 6 weeks    PT Treatment/Interventions ADLs/Self Care Home Management;Cryotherapy;Therapeutic activities;Functional mobility training;Gait training;Therapeutic exercise;Neuromuscular re-education;Patient/family education    PT Next Visit Plan General strengthening and conditioning, postural work, Dietitian, work on hip flexors and trunk extension AROM limitations and core/hip strength.    PT Home Exercise Plan Access Code: [email or web address]    Consulted and Agree with Plan of Care Patient           Patient will benefit from skilled therapeutic intervention in order to improve the following deficits and impairments:  Abnormal gait, Cardiopulmonary status limiting activity, Decreased activity tolerance, Decreased endurance, Decreased range of motion, Decreased mobility, Decreased strength, Difficulty walking, Hypomobility, Postural  dysfunction, Improper body mechanics, Obesity, Pain  Visit Diagnosis: Difficulty in walking, not elsewhere classified  Chronic bilateral low back pain without sciatica  Muscle weakness (generalized)     Problem List Patient Active Problem List   Diagnosis Date Noted  . HCAP (healthcare-associated pneumonia) 10/02/2018  . CHF exacerbation ([place]) 10/01/2018  . Community acquired pneumonia 10/01/2018  . OSA (obstructive sleep apnea) 02/10/2017  . Carpal tunnel syndrome, right 12/15/2016  . Extensor tenosynovitis of wrist, right 12/15/2016  . Bilateral primary osteoarthritis of knee 10/08/2015  . Rheumatoid arthritis, 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[place]) 10/08/2015  . Acute on chronic diastolic CHF (congestive heart failure) ([place]) 08/08/2015  . Morbid obesity ([place]) 05/06/2015  . Essential hypertension 04/10/2015  . 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 Primary osteoarthritis involving multiple joints 04/09/2015  . Elevated uric acid in blood 01/23/2014  . High risk medication use 01/23/2014  . Numbness and tingling in left hand 10/01/2013  . Tobacco abuse 10/01/2013  . Elevated CA-125 02/06/2013  . Pelvic mass in female 01/29/2013  . Diabetes mellitus ([place]) 08/10/2012  . Atrial fibrillation ([place])   . Chronic diastolic CHF (congestive heart failure) [facility])     Kathleen Kane  PT, DPT 04/28/2020, 12:08 PM  [facility] [address] [place], [place], [address] Phone: [phone]   Fax:  [phone]  Name: Kathleen Kane MRN: [id number] Date of Birth: 1961/11/04

## 2020-04-29 ENCOUNTER — Other Ambulatory Visit: Payer: Self-pay | Admitting: *Deleted

## 2020-04-29 MED ORDER — ATORVASTATIN CALCIUM 20 MG PO TABS: 20.0000 mg | ORAL_TABLET | Freq: Every day | ORAL | 3 refills | Status: DC

## 2020-04-30 ENCOUNTER — Ambulatory Visit: Payer: Medicaid Other | Admitting: Physical Therapy

## 2020-04-30 ENCOUNTER — Other Ambulatory Visit: Payer: Self-pay

## 2020-04-30 DIAGNOSIS — R262 Difficulty in walking, not elsewhere classified: Secondary | ICD-10-CM

## 2020-04-30 DIAGNOSIS — M6281 Muscle weakness (generalized): Secondary | ICD-10-CM

## 2020-04-30 DIAGNOSIS — G8929 Other chronic pain: Secondary | ICD-10-CM

## 2020-04-30 NOTE — Therapy (Signed)
[place], [place], [address] Phone: [phone]   Fax:  [phone]  Physical Therapy Treatment  Patient Details  Name: Kathleen Kane MRN: [id number] Date of Birth: 1961/02/05 Referring Provider (PT): Girard Cooter   Encounter Date: 04/30/2020   PT End of Session - 04/30/20 1005    Visit Number 5    Number of Visits 12    Date for PT Re-Evaluation 05/30/20    Authorization Type Re-auth requested    PT Start Time 1007    PT Stop Time 1050    PT Time Calculation (min) 43 min    Activity Tolerance Patient limited by fatigue;No increased pain    Behavior During Therapy WFL for tasks assessed/performed           Past Medical History:  Diagnosis Date  . Acute on chronic diastolic CHF (congestive heart failure) ([place]) 08/08/2015  . Anxiety   . Atrial fibrillation, persistent ([place])   . Carpal tunnel syndrome, right 12/15/2016  . CHF (congestive heart failure) ([place]) 2013   No echo found from that time, most likely diastolic  . CHF exacerbation ([place]) 10/01/2018  . Depression   . Diabetes mellitus ([place]) 08/10/2012  . Diabetes mellitus without complication ([place])   . Hypertension   . 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 Morbid obesity ([place]) 05/06/2015  . Obesity   . Obstructive sleep apnea    Sleep study performed 10/18/2008. AHI-6.8/hr, during REM-27.3/hr. RDI-25.0/hr, during REM-40.9/hr. avg o2 sat during REM and NREM 95%  . OSA (obstructive sleep apnea) 02/10/2017  . Pelvic mass in female 01/29/2013   With ascites   . Pneumonia 10/02/2018  . Rheumatoid arthritis ([place])   . Rheumatoid arthritis, 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[place]) 10/08/2015    Past Surgical History:  Procedure Laterality Date  . CARDIOVASCULAR STRESS TEST  02/16/2013   no significant EKG changes with Lexiscan, normal LV function and normal wall function  . CESAREAN SECTION     x2  . DOPPLER ECHOCARDIOGRAPHY   01/24/2008   EF 55-60%, no diagnostic evidence of LV wall motion abnormalities, LV wall thickness was mild-moderately increased.  Marland Kitchen LAPAROTOMY Right 02/27/2013   Procedure: EXPLORATORY LAPAROTOMY, right salpingo-oopherectomy;  Surgeon: [name], MD;  Location: [facility];  Service: Gynecology;  Laterality: Right;  . left breast cyst    . SALPINGOOPHORECTOMY Right 02/27/2013   Procedure: SALPINGO OOPHORECTOMY;  Surgeon: [name], MD;  Location: [facility];  Service: Gynecology;  Laterality: Right;    There were no vitals filed for this visit.   Subjective Assessment - 04/30/20 1010    Subjective Pt reports increased R knee pain today. Pt notes she still has some back pain but the knee is taking up all of her attention. Pt states she hasn't tried any ice. Pt states she was unable to sleep last night due to having to take her daughter to the emergency room at 4am.    Limitations Sitting;Lifting;Standing;Walking    How long can you sit comfortably? 1 hour    How long can you stand comfortably? < 5 minutes    How long can you walk comfortably? < 5 minutes    Patient Stated Goals Decrease pain and improve gait quality and weight-bearing function    Currently in Pain? Yes    Pain Score 6     Pain Location Knee    Pain Orientation Right    Pain Descriptors / Indicators Aching    Pain  Type Chronic pain    Pain Score 5    Pain Location Back                             [facility] Adult PT Treatment/Exercise - 04/30/20 0001      Ambulation/Gait   Ambulation/Gait Yes    Ambulation/Gait Assistance 4: Min guard    Ambulation Distance (Feet) 50 Feet    Assistive device Parallel bars    Gait Pattern Decreased weight shift to right;Lateral trunk lean to right;Step-through pattern    Ambulation Surface Level    Gait Comments cues on decreasing BOS, decreasing Rt lateral lean with weightbearing on R      Lumbar Exercises: Stretches   Hip Flexor Stretch Right;60 seconds    Quad  Stretch Right;60 seconds    Other Lumbar Stretch Exercise Hip adductor stretch x 30 sec      Lumbar Exercises: Aerobic   Nustep L5 x 5 min      Lumbar Exercises: Seated   Other Seated Lumbar Exercises On balance disk marching focusing on core activation x 10 reps    Other Seated Lumbar Exercises On balance disk: LAQ x 10 reps, both feet off the ground and marching x 10 reps   cues to decrease posterior trunk lean as compensation     Knee/Hip Exercises: Seated   Sit to Sand 5 reps      Manual Therapy   Manual Therapy Soft tissue mobilization;Myofascial release;Taping    Manual therapy comments KT tape on R knee to create space    Soft tissue mobilization R quad, adductor insertion                    PT Short Term Goals - 04/28/20 1049      PT SHORT TERM GOAL #1   Title Jaidon will be independent with her starter HEP.    Time 2    Period Weeks    Status Partially Met    Target Date 04/18/20             PT Long Term Goals - 04/28/20 1050      PT LONG TERM GOAL #1   Title Amiel will report low back pain consistently 0-3/10 on the Numeric Pain Rating Scale.    Baseline As high as 5/10.    Time 8    Period Weeks    Status On-going      PT LONG TERM GOAL #2   Title Neesa will be able to return to helping her husband with [place] chores at [place].    Baseline Unable to help with any household chores.    Time 8    Period Weeks    Status On-going      PT LONG TERM GOAL #3   Title Brigit will be able to stand upright due to improved hip flexors AROM, trunk extension AROM and core strength.    Baseline Can only stand short periods of time; however, demonstrates 10 deg flexed posture    Time 8    Period Weeks    Status Partially Met      PT LONG TERM GOAL #4   Title Ardelle will be independent with her final long-term HEP at [place].    Time 8    Period Weeks    Status On-going      PT LONG TERM GOAL #5   Title Pt wants to be able to amb  short distance (~10') with  LRAD    Baseline Unable without increased pain and poor gait mechanics    Time 6    Period Weeks    Status New    Target Date 06/09/20                 Plan - 04/30/20 1255    Clinical Impression Statement Pt comes into therapy with complaints of R medial knee/thigh pain -- addressed with manual therapy, gentle stretching and taping. Treatment focused on gait training with no a/d and increasing core strengthening on sissel. Gait remains antalgic due to R knee pain.    Personal Factors and Comorbidities Comorbidity 3+    Comorbidities CHF, atrial fibrillation, CHF, diabetes, HTN, RA, obesity, R carpal tunnel    Examination-Activity Limitations Bathing;Dressing;Sit;Transfers;Bed Mobility;Hygiene/Grooming;Bend;Lift;Squat;Locomotion Level;Stairs;Carry;Reach Overhead;Stand    Examination-Participation Restrictions Interpersonal Relationship;Yard Work;Laundry;Cleaning;Community Activity;Meal Prep    Rehab Potential Good    PT Frequency 2x / week    PT Duration 6 weeks    PT Treatment/Interventions ADLs/Self Care Home Management;Cryotherapy;Therapeutic activities;Functional mobility training;Gait training;Therapeutic exercise;Neuromuscular re-education;Patient/family education    PT Next Visit Plan Continue to work on posture, hip/knee strengthening, gait, and back/core stabilization.    PT Home Exercise Plan Access Code: [email or web address]    Consulted and Agree with Plan of Care Patient           Patient will benefit from skilled therapeutic intervention in order to improve the following deficits and impairments:  Abnormal gait, Cardiopulmonary status limiting activity, Decreased activity tolerance, Decreased endurance, Decreased range of motion, Decreased mobility, Decreased strength, Difficulty walking, Hypomobility, Postural dysfunction, Improper body mechanics, Obesity, Pain  Visit Diagnosis: Difficulty in walking, not elsewhere classified  Chronic bilateral low back pain without  sciatica  Muscle weakness (generalized)     Problem List Patient Active Problem List   Diagnosis Date Noted  . HCAP (healthcare-associated pneumonia) 10/02/2018  . CHF exacerbation ([place]) 10/01/2018  . Community acquired pneumonia 10/01/2018  . OSA (obstructive sleep apnea) 02/10/2017  . Carpal tunnel syndrome, right 12/15/2016  . Extensor tenosynovitis of wrist, right 12/15/2016  . Bilateral primary osteoarthritis of knee 10/08/2015  . Rheumatoid arthritis, 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[place]) 10/08/2015  . Acute on chronic diastolic CHF (congestive heart failure) ([place]) 08/08/2015  . Morbid obesity ([place]) 05/06/2015  . Essential hypertension 04/10/2015  . 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 Primary osteoarthritis involving multiple joints 04/09/2015  . Elevated uric acid in blood 01/23/2014  . High risk medication use 01/23/2014  . Numbness and tingling in left hand 10/01/2013  . Tobacco abuse 10/01/2013  . Elevated CA-125 02/06/2013  . Pelvic mass in female 01/29/2013  . Diabetes mellitus ([place]) 08/10/2012  . Atrial fibrillation ([place])   . Chronic diastolic CHF (congestive heart failure) [facility])     Tearsa Kowalewski April Ma L Stylianos Stradling PT, DPT 04/30/2020, 1:07 PM  [facility] [address] [place], [place], [address] Phone: [phone]   Fax:  [phone]  Name: LAYIA WALLA MRN: [id number] Date of Birth: 01-21-1961

## 2020-05-05 ENCOUNTER — Ambulatory Visit: Payer: Medicaid Other | Admitting: Physical Therapy

## 2020-05-07 ENCOUNTER — Other Ambulatory Visit: Payer: Self-pay

## 2020-05-07 ENCOUNTER — Ambulatory Visit: Payer: Medicaid Other | Admitting: Physical Therapy

## 2020-05-07 DIAGNOSIS — M6281 Muscle weakness (generalized): Secondary | ICD-10-CM

## 2020-05-07 DIAGNOSIS — R262 Difficulty in walking, not elsewhere classified: Secondary | ICD-10-CM

## 2020-05-07 DIAGNOSIS — M545 Low back pain, unspecified: Secondary | ICD-10-CM

## 2020-05-07 NOTE — Therapy (Signed)
[place], [place], [address] Phone: [phone]   Fax:  [phone]  Physical Therapy Treatment  Patient Details  Name: Kathleen Kane MRN: [id number] Date of Birth: 04/23/1961 Referring Provider (PT): Girard Cooter   Encounter Date: 05/07/2020   PT End of Session - 05/07/20 1058    Visit Number 6    Number of Visits 12    Date for PT Re-Evaluation 05/30/20    Authorization Type Re-auth requested    Authorization Time Period 05/05/2020 - 06/15/2020    Authorization - Visit Number 1    Authorization - Number of Visits 12    PT Start Time 3295    PT Stop Time 1884    PT Time Calculation (min) 46 min    Activity Tolerance Patient limited by fatigue;No increased pain    Behavior During Therapy WFL for tasks assessed/performed           Past Medical History:  Diagnosis Date  . Acute on chronic diastolic CHF (congestive heart failure) ([place]) 08/08/2015  . Anxiety   . Atrial fibrillation, persistent ([place])   . Carpal tunnel syndrome, right 12/15/2016  . CHF (congestive heart failure) ([place]) 2013   No echo found from that time, most likely diastolic  . CHF exacerbation ([place]) 10/01/2018  . Depression   . Diabetes mellitus ([place]) 08/10/2012  . Diabetes mellitus without complication ([place])   . Hypertension   . 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 Morbid obesity ([place]) 05/06/2015  . Obesity   . Obstructive sleep apnea    Sleep study performed 10/18/2008. AHI-6.8/hr, during REM-27.3/hr. RDI-25.0/hr, during REM-40.9/hr. avg o2 sat during REM and NREM 95%  . OSA (obstructive sleep apnea) 02/10/2017  . Pelvic mass in female 01/29/2013   With ascites   . Pneumonia 10/02/2018  . Rheumatoid arthritis ([place])   . Rheumatoid arthritis, 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[place]) 10/08/2015    Past Surgical History:  Procedure Laterality Date  . CARDIOVASCULAR STRESS TEST  02/16/2013   no significant EKG  changes with Lexiscan, normal LV function and normal wall function  . CESAREAN SECTION     x2  . DOPPLER ECHOCARDIOGRAPHY  01/24/2008   EF 55-60%, no diagnostic evidence of LV wall motion abnormalities, LV wall thickness was mild-moderately increased.  Marland Kitchen LAPAROTOMY Right 02/27/2013   Procedure: EXPLORATORY LAPAROTOMY, right salpingo-oopherectomy;  Surgeon: [name], MD;  Location: [facility];  Service: Gynecology;  Laterality: Right;  . left breast cyst    . SALPINGOOPHORECTOMY Right 02/27/2013   Procedure: SALPINGO OOPHORECTOMY;  Surgeon: [name], MD;  Location: [facility];  Service: Gynecology;  Laterality: Right;    There were no vitals filed for this visit.   Subjective Assessment - 05/07/20 1009    Subjective Pt reports she had a flare up of her RA on Monday and was unable to come in. Pt states she has continued Rt knee pain. Pt notes that pain is decreased when she stabilizes with her core.    Limitations Sitting;Lifting;Standing;Walking    How long can you sit comfortably? 1 hour    How long can you stand comfortably? < 5 minutes    How long can you walk comfortably? < 5 minutes    Patient Stated Goals Decrease pain and improve gait quality and weight-bearing function    Currently in Pain? Yes    Pain Score 6     Pain Location Knee    Pain Orientation Right  Pain Type Chronic pain    Multiple Pain Sites Yes    Pain Score 4    Pain Location Knee    Pain Orientation Left    Pain Score 5    Pain Location Back    Pain Radiating Towards Top of buttock                             [facility] Adult PT Treatment/Exercise - 05/07/20 0001      Ambulation/Gait   Ambulation/Gait Yes    Ambulation/Gait Assistance 4: Min guard    Ambulation Distance (Feet) --   50' x 2   Assistive device --    Gait Pattern Lateral trunk lean to right;Step-through pattern    Ambulation Surface Level    Gait Comments cues on decreasing BOS, decreasing Rt lateral lean with  weightbearing on R      Lumbar Exercises: Aerobic   Nustep L5 x 5 min      Lumbar Exercises: Seated   Other Seated Lumbar Exercises On balance disk marching with alternating UE focusing on core activation x 10 reps   seated and then standing   Other Seated Lumbar Exercises neck and scapular retraction 10 reps x 5 sec hold      Knee/Hip Exercises: Standing   Hip Abduction Stengthening;Both;10 reps   green tband   Hip Extension Stengthening;Both;10 reps   green tband     Knee/Hip Exercises: Seated   Long Arc Quad Strengthening;10 reps;Both;2 sets   green tband                 PT Education - 05/07/20 1058    Education Details Reinforced postural exercises in sitting and standing. Educated pt at [employer] about office ergonomics and provided Technical brewer) Educated Patient    Methods Explanation;Demonstration;Tactile cues;Verbal cues;Handout    Comprehension Verbalized understanding;Returned demonstration;Verbal cues required;Tactile cues required            PT Short Term Goals - 04/28/20 1049      PT SHORT TERM GOAL #1   Title Paislie will be independent with her starter HEP.    Time 2    Period Weeks    Status Partially Met    Target Date 04/18/20             PT Long Term Goals - 04/28/20 1050      PT LONG TERM GOAL #1   Title Misheel will report low back pain consistently 0-3/10 on the Numeric Pain Rating Scale.    Baseline As high as 5/10.    Time 8    Period Weeks    Status On-going      PT LONG TERM GOAL #2   Title Datra will be able to return to helping her husband with household chores at [place].    Baseline Unable to help with any household chores.    Time 8    Period Weeks    Status On-going      PT LONG TERM GOAL #3   Title Cambria will be able to stand upright due to improved hip flexors AROM, trunk extension AROM and core strength.    Baseline Can only stand short periods of time; however, demonstrates 10 deg flexed posture    Time 8    Period  Weeks    Status Partially Met      PT LONG TERM GOAL #4   Title Simona will be  independent with her final long-term HEP at [place].    Time 8    Period Weeks    Status On-going      PT LONG TERM GOAL #5   Title Pt wants to be able to amb short distance (~10') with LRAD    Baseline Unable without increased pain and poor gait mechanics    Time 6    Period Weeks    Status New    Target Date 06/09/20                 Plan - 05/07/20 1102    Clinical Impression Statement Pt returns to clinic with continued knee pain; however, posture appears improved. Treatment focused on improving gait without a/d, increasing strength for core, hip, and overall posture.    Personal Factors and Comorbidities Comorbidity 3+    Comorbidities CHF, atrial fibrillation, CHF, diabetes, HTN, RA, obesity, R carpal tunnel    Examination-Activity Limitations Bathing;Dressing;Sit;Transfers;Bed Mobility;Hygiene/Grooming;Bend;Lift;Squat;Locomotion Level;Stairs;Carry;Reach Overhead;Stand    Examination-Participation Restrictions Interpersonal Relationship;Yard Work;Laundry;Cleaning;Community Activity;Meal Prep    Rehab Potential Good    PT Frequency 2x / week    PT Duration 6 weeks    PT Treatment/Interventions ADLs/Self Care Home Management;Cryotherapy;Therapeutic activities;Functional mobility training;Gait training;Therapeutic exercise;Neuromuscular re-education;Patient/family education    PT Next Visit Plan Continue to work on posture, hip/knee strengthening, gait, and back/core stabilization. Increase quad strengthening. Provide manual as indicated.    PT Home Exercise Plan Access Code: [email or web address]    Consulted and Agree with Plan of Care Patient           Patient will benefit from skilled therapeutic intervention in order to improve the following deficits and impairments:  Abnormal gait, Cardiopulmonary status limiting activity, Decreased activity tolerance, Decreased endurance, Decreased range of motion,  Decreased mobility, Decreased strength, Difficulty walking, Hypomobility, Postural dysfunction, Improper body mechanics, Obesity, Pain  Visit Diagnosis: Difficulty in walking, not elsewhere classified  Chronic bilateral low back pain without sciatica  Muscle weakness (generalized)     Problem List Patient Active Problem List   Diagnosis Date Noted  . HCAP (healthcare-associated pneumonia) 10/02/2018  . CHF exacerbation ([place]) 10/01/2018  . Community acquired pneumonia 10/01/2018  . OSA (obstructive sleep apnea) 02/10/2017  . Carpal tunnel syndrome, right 12/15/2016  . Extensor tenosynovitis of wrist, right 12/15/2016  . Bilateral primary osteoarthritis of knee 10/08/2015  . Rheumatoid arthritis, 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[place]) 10/08/2015  . Acute on chronic diastolic CHF (congestive heart failure) ([place]) 08/08/2015  . Morbid obesity ([place]) 05/06/2015  . Essential hypertension 04/10/2015  . 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 Primary osteoarthritis involving multiple joints 04/09/2015  . Elevated uric acid in blood 01/23/2014  . High risk medication use 01/23/2014  . Numbness and tingling in left hand 10/01/2013  . Tobacco abuse 10/01/2013  . Elevated CA-125 02/06/2013  . Pelvic mass in female 01/29/2013  . Diabetes mellitus ([place]) 08/10/2012  . Atrial fibrillation ([place])   . Chronic diastolic CHF (congestive heart failure) [facility])     Von Quintanar April Ma L Ilijah Doucet PT, DPT 05/07/2020, 11:04 AM  [facility] [address] [place], [place], [address] Phone: [phone]   Fax:  [phone]  Name: AISLINN FELIZ MRN: [id number] Date of Birth: 03/13/1961

## 2020-05-14 ENCOUNTER — Ambulatory Visit: Payer: Medicaid Other | Attending: Internal Medicine | Admitting: Physical Therapy

## 2020-05-14 ENCOUNTER — Other Ambulatory Visit: Payer: Self-pay

## 2020-05-14 DIAGNOSIS — M6281 Muscle weakness (generalized): Secondary | ICD-10-CM | POA: Insufficient documentation

## 2020-05-14 DIAGNOSIS — M545 Low back pain: Secondary | ICD-10-CM | POA: Diagnosis present

## 2020-05-14 DIAGNOSIS — R262 Difficulty in walking, not elsewhere classified: Secondary | ICD-10-CM | POA: Insufficient documentation

## 2020-05-14 DIAGNOSIS — G8929 Other chronic pain: Secondary | ICD-10-CM | POA: Diagnosis present

## 2020-05-14 NOTE — Therapy (Signed)
[place], [place], [address] Phone: [phone]   Fax:  [phone]  Physical Therapy Treatment  Patient Details  Name: Kathleen Kane MRN: [id number] Date of Birth: 05-20-1961 Referring Provider (PT): Girard Cooter   Encounter Date: 05/14/2020   PT End of Session - 05/14/20 1001    Visit Number 7    Number of Visits 12    Date for PT Re-Evaluation 05/30/20    Authorization Type Re-auth requested    Authorization Time Period 05/05/2020 - 06/15/2020    Authorization - Visit Number 2    Authorization - Number of Visits 12    PT Start Time 6962    PT Stop Time 1048    PT Time Calculation (min) 46 min    Activity Tolerance No increased pain;Patient tolerated treatment well    Behavior During Therapy [facility] for tasks assessed/performed           Past Medical History:  Diagnosis Date  . Acute on chronic diastolic CHF (congestive heart failure) ([place]) 08/08/2015  . Anxiety   . Atrial fibrillation, persistent ([place])   . Carpal tunnel syndrome, right 12/15/2016  . CHF (congestive heart failure) ([place]) 2013   No echo found from that time, most likely diastolic  . CHF exacerbation ([place]) 10/01/2018  . Depression   . Diabetes mellitus ([place]) 08/10/2012  . Diabetes mellitus without complication ([place])   . Hypertension   . 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 Morbid obesity ([place]) 05/06/2015  . Obesity   . Obstructive sleep apnea    Sleep study performed 10/18/2008. AHI-6.8/hr, during REM-27.3/hr. RDI-25.0/hr, during REM-40.9/hr. avg o2 sat during REM and NREM 95%  . OSA (obstructive sleep apnea) 02/10/2017  . Pelvic mass in female 01/29/2013   With ascites   . Pneumonia 10/02/2018  . Rheumatoid arthritis ([place])   . Rheumatoid arthritis, 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[place]) 10/08/2015    Past Surgical History:  Procedure Laterality Date  . CARDIOVASCULAR STRESS TEST  02/16/2013   no significant EKG  changes with Lexiscan, normal LV function and normal wall function  . CESAREAN SECTION     x2  . DOPPLER ECHOCARDIOGRAPHY  01/24/2008   EF 55-60%, no diagnostic evidence of LV wall motion abnormalities, LV wall thickness was mild-moderately increased.  Marland Kitchen LAPAROTOMY Right 02/27/2013   Procedure: EXPLORATORY LAPAROTOMY, right salpingo-oopherectomy;  Surgeon: [name], MD;  Location: [facility];  Service: Gynecology;  Laterality: Right;  . left breast cyst    . SALPINGOOPHORECTOMY Right 02/27/2013   Procedure: SALPINGO OOPHORECTOMY;  Surgeon: [name], MD;  Location: [facility];  Service: Gynecology;  Laterality: Right;    There were no vitals filed for this visit.   Subjective Assessment - 05/14/20 1005    Subjective Pt reports that she was getting over confident and had a near fall at home. Pt states she has been walking around her home short distances without the rollator.    Limitations Sitting;Lifting;Standing;Walking    How long can you sit comfortably? 1 hour    How long can you stand comfortably? < 5 minutes    How long can you walk comfortably? < 5 minutes    Patient Stated Goals Decrease pain and improve gait quality and weight-bearing function    Currently in Pain? Yes    Pain Score 5     Pain Location Knee    Pain Orientation Right    Pain Type Chronic pain    Pain  Score 5    Pain Location Buttocks    Pain Orientation Left;Right                             [facility] Adult PT Treatment/Exercise - 05/14/20 0001      Lumbar Exercises: Stretches   Passive Hamstring Stretch Right;Left;30 seconds    Lower Trunk Rotation 3 reps;20 seconds    Piriformis Stretch Right;Left;30 seconds      Lumbar Exercises: Seated   Other Seated Lumbar Exercises On bosu: marching x 10, alternating UE with marching x 10, bilat hip flexion with knee straight x 10      Knee/Hip Exercises: Standing   Terminal Knee Extension Strengthening;10 reps;Both   5 sec hold   Theraband  Level (Terminal Knee Extension) Level 4 (Blue)      Knee/Hip Exercises: Seated   Long Arc Quad Strengthening;10 reps;Both;2 sets    Long Arc Quad Limitations blue tband    Hamstring Curl Strengthening;Both;10 reps    Hamstring Limitations blue tband                  PT Education - 05/14/20 1452    Education Details Discussed decreasing knee hyperextension to decrease pressure and pain on her knees.    Person(s) Educated Patient    Methods Explanation;Demonstration;Tactile cues;Verbal cues    Comprehension Verbalized understanding;Returned demonstration;Verbal cues required;Tactile cues required            PT Short Term Goals - 04/28/20 1049      PT SHORT TERM GOAL #1   Title Kathleen Kane will be independent with her starter HEP.    Time 2    Period Weeks    Status Partially Met    Target Date 04/18/20             PT Long Term Goals - 04/28/20 1050      PT LONG TERM GOAL #1   Title Kathleen Kane will report low back pain consistently 0-3/10 on the Numeric Pain Rating Scale.    Baseline As high as 5/10.    Time 8    Period Weeks    Status On-going      PT LONG TERM GOAL #2   Title Kathleen Kane will be able to return to helping her husband with household chores at [place].    Baseline Unable to help with any household chores.    Time 8    Period Weeks    Status On-going      PT LONG TERM GOAL #3   Title Kathleen Kane will be able to stand upright due to improved hip flexors AROM, trunk extension AROM and core strength.    Baseline Can only stand short periods of time; however, demonstrates 10 deg flexed posture    Time 8    Period Weeks    Status Partially Met      PT LONG TERM GOAL #4   Title Kathleen Kane will be independent with her final long-term HEP at [place].    Time 8    Period Weeks    Status On-going      PT LONG TERM GOAL #5   Title Pt wants to be able to amb short distance (~10') with LRAD    Baseline Unable without increased pain and poor gait mechanics    Time 6    Period Weeks     Status New    Target Date 06/09/20  Plan - 05/14/20 1449    Clinical Impression Statement Decreased knee pain this session. Treatment focused on improving knee neuro re-ed for stability and strengthening, core strengthening, and hip stretching for pt's buttock and hip pain. Pt with improving amb and is demonstrating increased confidence with walking without a/d. Cautioned pt about keeping close to a counter for safety when attempting this at home.    Personal Factors and Comorbidities Comorbidity 3+    Comorbidities CHF, atrial fibrillation, CHF, diabetes, HTN, RA, obesity, R carpal tunnel    Examination-Activity Limitations Bathing;Dressing;Sit;Transfers;Bed Mobility;Hygiene/Grooming;Bend;Lift;Squat;Locomotion Level;Stairs;Carry;Reach Overhead;Stand    Examination-Participation Restrictions Interpersonal Relationship;Yard Work;Laundry;Cleaning;Community Activity;Meal Prep    Rehab Potential Good    PT Frequency 2x / week    PT Duration 6 weeks    PT Treatment/Interventions ADLs/Self Care Home Management;Cryotherapy;Therapeutic activities;Functional mobility training;Gait training;Therapeutic exercise;Neuromuscular re-education;Patient/family education    PT Next Visit Plan Continue to work on posture, hip/knee strengthening, gait, and back/core stabilization. Increase quad and hamstring strengthening. Provide manual therapy as indicated.    PT Home Exercise Plan Access Code: [email or web address]    Consulted and Agree with Plan of Care Patient           Patient will benefit from skilled therapeutic intervention in order to improve the following deficits and impairments:  Abnormal gait, Cardiopulmonary status limiting activity, Decreased activity tolerance, Decreased endurance, Decreased range of motion, Decreased mobility, Decreased strength, Difficulty walking, Hypomobility, Postural dysfunction, Improper body mechanics, Obesity, Pain  Visit Diagnosis: Difficulty in walking,  not elsewhere classified  Chronic bilateral low back pain without sciatica  Muscle weakness (generalized)     Problem List Patient Active Problem List   Diagnosis Date Noted  . HCAP (healthcare-associated pneumonia) 10/02/2018  . CHF exacerbation ([place]) 10/01/2018  . Community acquired pneumonia 10/01/2018  . OSA (obstructive sleep apnea) 02/10/2017  . Carpal tunnel syndrome, right 12/15/2016  . Extensor tenosynovitis of wrist, right 12/15/2016  . Bilateral primary osteoarthritis of knee 10/08/2015  . Rheumatoid arthritis, 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[place]) 10/08/2015  . Acute on chronic diastolic CHF (congestive heart failure) ([place]) 08/08/2015  . Morbid obesity ([place]) 05/06/2015  . Essential hypertension 04/10/2015  . 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 Primary osteoarthritis involving multiple joints 04/09/2015  . Elevated uric acid in blood 01/23/2014  . High risk medication use 01/23/2014  . Numbness and tingling in left hand 10/01/2013  . Tobacco abuse 10/01/2013  . Elevated CA-125 02/06/2013  . Pelvic mass in female 01/29/2013  . Diabetes mellitus ([place]) 08/10/2012  . Atrial fibrillation ([place])   . Chronic diastolic CHF (congestive heart failure) [facility])     Nandan Willems April Ma L Artavius Stearns PT, DPT 05/14/2020, 2:56 PM  [facility] [address] [place], [place], [address] Phone: [phone]   Fax:  [phone]  Name: Kathleen Kane MRN: [id number] Date of Birth: [birth year]

## 2020-05-15 ENCOUNTER — Telehealth: Payer: Self-pay | Admitting: Physical Therapy

## 2020-05-15 ENCOUNTER — Ambulatory Visit: Payer: Medicaid Other | Admitting: Physical Therapy

## 2020-05-15 NOTE — Telephone Encounter (Signed)
LVM to pt about missed 10am appointment.   Pt reminded to call clinic if there are any scheduling issues or conflicts. Provided reminder that pt has another appointment at [place] on 7/12.   Reynard Christoffersen April Gordy Levan, PT, DPT

## 2020-05-19 ENCOUNTER — Other Ambulatory Visit: Payer: Self-pay

## 2020-05-19 ENCOUNTER — Ambulatory Visit: Payer: Medicaid Other | Admitting: Physical Therapy

## 2020-05-19 DIAGNOSIS — R262 Difficulty in walking, not elsewhere classified: Secondary | ICD-10-CM

## 2020-05-19 DIAGNOSIS — G8929 Other chronic pain: Secondary | ICD-10-CM

## 2020-05-19 DIAGNOSIS — M6281 Muscle weakness (generalized): Secondary | ICD-10-CM

## 2020-05-19 NOTE — Therapy (Signed)
[place], [place], [address] Phone: [phone]   Fax:  [phone]  Physical Therapy Treatment  Patient Details  Name: Kathleen Kane MRN: [id number] Date of Birth: January 28, 1961 Referring Provider (PT): Girard Cooter   Encounter Date: 05/19/2020   PT End of Session - 05/19/20 1003    Visit Number 8    Number of Visits 12    Date for PT Re-Evaluation 05/30/20    Authorization Type Re-auth requested    Authorization Time Period 05/05/2020 - 06/15/2020    Authorization - Visit Number 3    Authorization - Number of Visits 12    PT Start Time 1004    PT Stop Time 1157    PT Time Calculation (min) 43 min    Activity Tolerance No increased pain;Patient tolerated treatment well    Behavior During Therapy [facility] for tasks assessed/performed           Past Medical History:  Diagnosis Date   Acute on chronic diastolic CHF (congestive heart failure) ([place]) 08/08/2015   Anxiety    Atrial fibrillation, persistent (HCC)    Carpal tunnel syndrome, right 12/15/2016   CHF (congestive heart failure) ([place]) 2013   No echo found from that time, most likely diastolic   CHF exacerbation ([place]) 10/01/2018   Depression    Diabetes mellitus ([place]) 08/10/2012   Diabetes mellitus without complication ([place])    Hypertension    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   Morbid obesity ([place]) 05/06/2015   Obesity    Obstructive sleep apnea    Sleep study performed 10/18/2008. AHI-6.8/hr, during REM-27.3/hr. RDI-25.0/hr, during REM-40.9/hr. avg o2 sat during REM and NREM 95%   OSA (obstructive sleep apnea) 02/10/2017   Pelvic mass in female 01/29/2013   With ascites    Pneumonia 10/02/2018   Rheumatoid arthritis ([place])    Rheumatoid arthritis, 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[place]) 10/08/2015    Past Surgical History:  Procedure Laterality Date   CARDIOVASCULAR STRESS TEST  02/16/2013   no significant EKG  changes with Lexiscan, normal LV function and normal wall function   CESAREAN SECTION     x2   DOPPLER ECHOCARDIOGRAPHY  01/24/2008   EF 55-60%, no diagnostic evidence of LV wall motion abnormalities, LV wall thickness was mild-moderately increased.   LAPAROTOMY Right 02/27/2013   Procedure: EXPLORATORY LAPAROTOMY, right salpingo-oopherectomy;  Surgeon: [name], MD;  Location: [facility];  Service: Gynecology;  Laterality: Right;   left breast cyst     SALPINGOOPHORECTOMY Right 02/27/2013   Procedure: SALPINGO OOPHORECTOMY;  Surgeon: [name], MD;  Location: [facility];  Service: Gynecology;  Laterality: Right;    There were no vitals filed for this visit.   Subjective Assessment - 05/19/20 1006    Subjective Pt states that stretches did help after last session. Pt reports she took some ibuprofen this morning and helped with her knee pain. Pt states she's been walking around the house with no a/d. Pt reports she's been trying to be more aware of her knees hyperextending.    Limitations Sitting;Lifting;Standing;Walking    How long can you sit comfortably? 1 hour    How long can you stand comfortably? < 5 minutes    How long can you walk comfortably? < 5 minutes    Patient Stated Goals Decrease pain and improve gait quality and weight-bearing function    Currently in Pain? Yes    Pain Score 5     Pain Location  Knee    Pain Orientation Left;Right                             [facility] Adult PT Treatment/Exercise - 05/19/20 0001      Lumbar Exercises: Stretches   Passive Hamstring Stretch Right;Left;30 seconds    Lower Trunk Rotation 3 reps;20 seconds    Hip Flexor Stretch Right;60 seconds;Left    Quad Stretch Right;60 seconds;Left   in prone     Lumbar Exercises: Aerobic   Nustep L5 x 6 min      Lumbar Exercises: Prone   Straight Leg Raise 20 reps      Knee/Hip Exercises: Stretches   Hip Flexor Stretch Both;30 seconds      Knee/Hip Exercises: Seated    Long Arc Quad Strengthening;10 reps;Both;2 sets    Long Arc Quad Limitations blue tband    Sit to [employer] 10 reps   eccentric return to sit     Manual Therapy   Manual Therapy Soft tissue mobilization;Myofascial release    Soft tissue mobilization TPR bilat upper buttocks (glute med/max, QL)                    PT Short Term Goals - 04/28/20 1049      PT SHORT TERM GOAL #1   Title Kanisha will be independent with her starter HEP.    Time 2    Period Weeks    Status Partially Met    Target Date 04/18/20             PT Long Term Goals - 04/28/20 1050      PT LONG TERM GOAL #1   Title Porsche will report low back pain consistently 0-3/10 on the Numeric Pain Rating Scale.    Baseline As high as 5/10.    Time 8    Period Weeks    Status On-going      PT LONG TERM GOAL #2   Title Sherrie will be able to return to helping her husband with household chores at [place].    Baseline Unable to help with any household chores.    Time 8    Period Weeks    Status On-going      PT LONG TERM GOAL #3   Title Alisen will be able to stand upright due to improved hip flexors AROM, trunk extension AROM and core strength.    Baseline Can only stand short periods of time; however, demonstrates 10 deg flexed posture    Time 8    Period Weeks    Status Partially Met      PT LONG TERM GOAL #4   Title Aliya will be independent with her final long-term HEP at [place].    Time 8    Period Weeks    Status On-going      PT LONG TERM GOAL #5   Title Pt wants to be able to amb short distance (~10') with LRAD    Baseline Unable without increased pain and poor gait mechanics    Time 6    Period Weeks    Status New    Target Date 06/09/20                 Plan - 05/19/20 1131    Clinical Impression Statement Pt with improving standing and walking tolerance. Treatment focused on manual for her buttock pain, improving hip/trunk extension, and quad strengthening.  Personal Factors and  Comorbidities Comorbidity 3+    Comorbidities CHF, atrial fibrillation, CHF, diabetes, HTN, RA, obesity, R carpal tunnel    Examination-Activity Limitations Bathing;Dressing;Sit;Transfers;Bed Mobility;Hygiene/Grooming;Bend;Lift;Squat;Locomotion Level;Stairs;Carry;Reach Overhead;Stand    Examination-Participation Restrictions Interpersonal Relationship;Yard Work;Laundry;Cleaning;Community Activity;Meal Prep    Rehab Potential Good    PT Frequency 2x / week    PT Duration 6 weeks    PT Treatment/Interventions ADLs/Self Care Home Management;Cryotherapy;Therapeutic activities;Functional mobility training;Gait training;Therapeutic exercise;Neuromuscular re-education;Patient/family education    PT Next Visit Plan Continue to work on posture, hip/knee strengthening, gait, and back/core stabilization. Increase quad and hamstring strengthening. Provide manual therapy as indicated.    PT Home Exercise Plan Access Code: [email or web address]    Consulted and Agree with Plan of Care Patient           Patient will benefit from skilled therapeutic intervention in order to improve the following deficits and impairments:  Abnormal gait, Cardiopulmonary status limiting activity, Decreased activity tolerance, Decreased endurance, Decreased range of motion, Decreased mobility, Decreased strength, Difficulty walking, Hypomobility, Postural dysfunction, Improper body mechanics, Obesity, Pain  Visit Diagnosis: Difficulty in walking, not elsewhere classified  Chronic bilateral low back pain without sciatica  Muscle weakness (generalized)     Problem List Patient Active Problem List   Diagnosis Date Noted   HCAP (healthcare-associated pneumonia) 10/02/2018   CHF exacerbation ([place]) 10/01/2018   Community acquired pneumonia 10/01/2018   OSA (obstructive sleep apnea) 02/10/2017   Carpal tunnel syndrome, right 12/15/2016   Extensor tenosynovitis of wrist, right 12/15/2016   Bilateral primary osteoarthritis of  knee 10/08/2015   Rheumatoid arthritis, 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[place]) 10/08/2015   Acute on chronic diastolic CHF (congestive heart failure) ([place]) 08/08/2015   Morbid obesity ([place]) 05/06/2015   Essential hypertension 04/10/2015   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   Primary osteoarthritis involving multiple joints 04/09/2015   Elevated uric acid in blood 01/23/2014   High risk medication use 01/23/2014   Numbness and tingling in left hand 10/01/2013   Tobacco abuse 10/01/2013   Elevated CA-125 02/06/2013   Pelvic mass in female 01/29/2013   Diabetes mellitus ([place]) 08/10/2012   Atrial fibrillation (HCC)    Chronic diastolic CHF (congestive heart failure) [facility])     Hayat [place]ton April Ma L Geneieve Duell  PT, DPT 05/19/2020, 11:45 AM  [facility] [address] [place], [place], [address] Phone: [phone]   Fax:  [phone]  Name: DAVEENA ELMORE MRN: [id number] Date of Birth: Jun 04, 1961

## 2020-05-21 ENCOUNTER — Ambulatory Visit: Payer: Medicaid Other | Admitting: Physical Therapy

## 2020-05-21 ENCOUNTER — Other Ambulatory Visit: Payer: Self-pay

## 2020-05-21 DIAGNOSIS — R262 Difficulty in walking, not elsewhere classified: Secondary | ICD-10-CM

## 2020-05-21 DIAGNOSIS — M6281 Muscle weakness (generalized): Secondary | ICD-10-CM

## 2020-05-21 DIAGNOSIS — M545 Low back pain, unspecified: Secondary | ICD-10-CM

## 2020-05-21 NOTE — Therapy (Addendum)
[place], [place], [address] Phone: [phone]   Fax:  [phone]  Physical Therapy Treatment and Discharge  Patient Details  Name: Kathleen Kane MRN: [id number] Date of Birth: 1960/12/05 Referring Provider (PT): Girard Cooter   Encounter Date: 05/21/2020   PT End of Session - 05/21/20 1043    Visit Number 9    Number of Visits 12    Date for PT Re-Evaluation 05/30/20    Authorization Type Re-auth requested    Authorization Time Period 05/05/2020 - 06/15/2020    Authorization - Visit Number 4    Authorization - Number of Visits 12    PT Start Time 1001    PT Stop Time 1045    PT Time Calculation (min) 44 min    Activity Tolerance No increased pain;Patient tolerated treatment well    Behavior During Therapy [facility] for tasks assessed/performed           Past Medical History:  Diagnosis Date  . Acute on chronic diastolic CHF (congestive heart failure) ([place]) 08/08/2015  . Anxiety   . Atrial fibrillation, persistent ([place])   . Carpal tunnel syndrome, right 12/15/2016  . CHF (congestive heart failure) ([place]) 2013   No echo found from that time, most likely diastolic  . CHF exacerbation ([place]) 10/01/2018  . Depression   . Diabetes mellitus ([place]) 08/10/2012  . Diabetes mellitus without complication ([place])   . Hypertension   . 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 Morbid obesity ([place]) 05/06/2015  . Obesity   . Obstructive sleep apnea    Sleep study performed 10/18/2008. AHI-6.8/hr, during REM-27.3/hr. RDI-25.0/hr, during REM-40.9/hr. avg o2 sat during REM and NREM 95%  . OSA (obstructive sleep apnea) 02/10/2017  . Pelvic mass in female 01/29/2013   With ascites   . Pneumonia 10/02/2018  . Rheumatoid arthritis ([place])   . Rheumatoid arthritis, 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[place]) 10/08/2015    Past Surgical History:  Procedure Laterality Date  . CARDIOVASCULAR STRESS TEST  02/16/2013   no  significant EKG changes with Lexiscan, normal LV function and normal wall function  . CESAREAN SECTION     x2  . DOPPLER ECHOCARDIOGRAPHY  01/24/2008   EF 55-60%, no diagnostic evidence of LV wall motion abnormalities, LV wall thickness was mild-moderately increased.  Marland Kitchen LAPAROTOMY Right 02/27/2013   Procedure: EXPLORATORY LAPAROTOMY, right salpingo-oopherectomy;  Surgeon: [name], MD;  [facility];  Service: Gynecology;  Laterality: Right;  . left breast cyst    . SALPINGOOPHORECTOMY Right 02/27/2013   Procedure: SALPINGO OOPHORECTOMY;  Surgeon: [name], MD;  [facility];  Service: Gynecology;  Laterality: Right;    There were no vitals filed for this visit.   Subjective Assessment - 05/21/20 1006    Subjective Pt notes that her buttock and knee pain have decreased. She states that rolling it on the tennis ball has helped a little bit.    Limitations Sitting;Lifting;Standing;Walking    How long can you sit comfortably? 1 hour    How long can you stand comfortably? < 5 minutes    How long can you walk comfortably? < 5 minutes    Patient Stated Goals Decrease pain and improve gait quality and weight-bearing function    Currently in Pain? Yes    Pain Score 4     Pain Location Knee    Pain Score 4    Pain Location Buttocks  [facility] Adult PT Treatment/Exercise - 05/21/20 0001      Lumbar Exercises: Stretches   Lower Trunk Rotation 5 reps;10 seconds    Quad Stretch Right;Left;60 seconds      Lumbar Exercises: Aerobic   Nustep L5 x 5 min      Lumbar Exercises: Standing   Other Standing Lumbar Exercises diagonal chop x 10 reps      Knee/Hip Exercises: Standing   Other Standing Knee Exercises Marching high knees x 10 reps    Other Standing Knee Exercises On airex: feet together eyes closed x 30 sec, feet together with horizontal and vertical head turns x 10 reps,       Manual Therapy   Manual Therapy Soft tissue  mobilization;Myofascial release    Joint Mobilization grade III anterior, lateral & inferior hip mobs    Soft tissue mobilization TPR bilat upper buttocks (glute med/max, QL)                    PT Short Term Goals - 04/28/20 1049      PT SHORT TERM GOAL #1   Title Hilari will be independent with her starter HEP.    Time 2    Period Weeks    Status Partially Met    Target Date 04/18/20             PT Long Term Goals - 04/28/20 1050      PT LONG TERM GOAL #1   Title Rahaf will report low back pain consistently 0-3/10 on the Numeric Pain Rating Scale.    Baseline As high as 5/10.    Time 8    Period Weeks    Status On-going      PT LONG TERM GOAL #2   Title Keslee will be able to return to helping her husband with household chores at [place].    Baseline Unable to help with any household chores.    Time 8    Period Weeks    Status On-going      PT LONG TERM GOAL #3   Title Marlinda will be able to stand upright due to improved hip flexors AROM, trunk extension AROM and core strength.    Baseline Can only stand short periods of time; however, demonstrates 10 deg flexed posture    Time 8    Period Weeks    Status Partially Met      PT LONG TERM GOAL #4   Title Alayja will be independent with her final long-term HEP at [place].    Time 8    Period Weeks    Status On-going      PT LONG TERM GOAL #5   Title Pt wants to be able to amb short distance (~10') with LRAD    Baseline Unable without increased pain and poor gait mechanics    Time 6    Period Weeks    Status New    Target Date 06/09/20                 Plan - 05/21/20 1047    Clinical Impression Statement Treatment focused on continued manual and stretching for buttock/hip pain. Increased core strengthening and initiated balance/proprioceptive exercises. Pt continues to improve.    Personal Factors and Comorbidities Comorbidity 3+    Comorbidities CHF, atrial fibrillation, CHF, diabetes, HTN, RA, obesity,  R carpal tunnel    Examination-Activity Limitations Bathing;Dressing;Sit;Transfers;Bed Mobility;Hygiene/Grooming;Bend;Lift;Squat;Locomotion Level;Stairs;Carry;Reach Overhead;Stand    Examination-Participation Restrictions Interpersonal Relationship;Yard Work;Laundry;Cleaning;Community Activity;Meal Prep  Rehab Potential Good    PT Frequency 2x / week    PT Duration 6 weeks    PT Treatment/Interventions ADLs/Self Care Home Management;Cryotherapy;Therapeutic activities;Functional mobility training;Gait training;Therapeutic exercise;Neuromuscular re-education;Patient/family education    PT Next Visit Plan Continue to work on CC hip/knee strengthening, gait, and back/core stabilization. Increase quad and hamstring strengthening. Provide manual therapy as indicated. Increase SLS, stepping, and balance as able. Consider improving walking endurance    PT Home Exercise Plan Access Code: [email or web address]    Consulted and Agree with Plan of Care Patient           PHYSICAL THERAPY DISCHARGE SUMMARY  Visits from Start of Care: 9  Current functional level related to goals / functional outcomes: Pt has been consistently rating LBP <5/10. She has been able to ambulate without the rollator and stand with improved mechanics.   Remaining deficits: Reports some continued back pain and not yet able to help husband with chores.   Education / Equipment: See above  Plan: Patient agrees to discharge.  Patient goals were partially met. Patient is being discharged due to not returning since the last visit.  ?????        Patient will benefit from skilled therapeutic intervention in order to improve the following deficits and impairments:  Abnormal gait, Cardiopulmonary status limiting activity, Decreased activity tolerance, Decreased endurance, Decreased range of motion, Decreased mobility, Decreased strength, Difficulty walking, Hypomobility, Postural dysfunction, Improper body mechanics, Obesity, Pain  Visit  Diagnosis: Difficulty in walking, not elsewhere classified  Chronic bilateral low back pain without sciatica  Muscle weakness (generalized)     Problem List Patient Active Problem List   Diagnosis Date Noted  . HCAP (healthcare-associated pneumonia) 10/02/2018  . CHF exacerbation ([place]) 10/01/2018  . Community acquired pneumonia 10/01/2018  . OSA (obstructive sleep apnea) 02/10/2017  . Carpal tunnel syndrome, right 12/15/2016  . Extensor tenosynovitis of wrist, right 12/15/2016  . Bilateral primary osteoarthritis of knee 10/08/2015  . Rheumatoid arthritis, 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[place]) 10/08/2015  . Acute on chronic diastolic CHF (congestive heart failure) ([place]) 08/08/2015  . Morbid obesity ([place]) 05/06/2015  . Essential hypertension 04/10/2015  . 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 Primary osteoarthritis involving multiple joints 04/09/2015  . Elevated uric acid in blood 01/23/2014  . High risk medication use 01/23/2014  . Numbness and tingling in left hand 10/01/2013  . Tobacco abuse 10/01/2013  . Elevated CA-125 02/06/2013  . Pelvic mass in female 01/29/2013  . Diabetes mellitus ([place]) 08/10/2012  . Atrial fibrillation ([place])   . Chronic diastolic CHF (congestive heart failure) [facility])     Marjorie Deprey April Ma L Jaeli Grubb PT, DPT 05/21/2020, 12:51 PM  [facility] [address] [place], [place], [address] Phone: [phone]   Fax:  [phone]  Name: JHANA GIARRATANO MRN: [id number] Date of Birth: 09-Aug-1961

## 2020-05-26 ENCOUNTER — Ambulatory Visit: Payer: Medicaid Other | Admitting: Physical Therapy

## 2020-05-28 ENCOUNTER — Other Ambulatory Visit: Payer: Self-pay

## 2020-05-28 ENCOUNTER — Ambulatory Visit: Payer: Medicaid Other | Admitting: Podiatry

## 2020-05-28 ENCOUNTER — Encounter: Payer: Self-pay | Admitting: Podiatry

## 2020-05-28 DIAGNOSIS — E1149 Type 2 diabetes mellitus with other diabetic neurological complication: Secondary | ICD-10-CM

## 2020-05-28 DIAGNOSIS — E1140 Type 2 diabetes mellitus with diabetic neuropathy, unspecified: Secondary | ICD-10-CM

## 2020-05-28 DIAGNOSIS — M79674 Pain in right toe(s): Secondary | ICD-10-CM

## 2020-05-28 DIAGNOSIS — M79675 Pain in left toe(s): Secondary | ICD-10-CM

## 2020-05-28 DIAGNOSIS — B351 Tinea unguium: Secondary | ICD-10-CM

## 2020-06-01 NOTE — Progress Notes (Signed)
Subjective:  Patient ID: Kathleen Kane, female    DOB: Mar 19, 1961,  MRN: [id number]  59 y.o. female presents with preventative diabetic foot care and painful thick toenails that are difficult to trim. Pain interferes with ambulation. Aggravating factors include wearing enclosed shoe gear. Pain is relieved with periodic professional debridement..    Review of Systems: Negative except as noted in the HPI. Denies N/V/F/Ch. Past Medical History:  Diagnosis Date  . Acute on chronic diastolic CHF (congestive heart failure) ([place]) 08/08/2015  . Anxiety   . Atrial fibrillation, persistent ([place])   . Carpal tunnel syndrome, right 12/15/2016  . CHF (congestive heart failure) ([place]) 2013   No echo found from that time, most likely diastolic  . CHF exacerbation ([place]) 10/01/2018  . Depression   . Diabetes mellitus ([place]) 08/10/2012  . Diabetes mellitus without complication ([place])   . Hypertension   . 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 Morbid obesity ([place]) 05/06/2015  . Obesity   . Obstructive sleep apnea    Sleep study performed 10/18/2008. AHI-6.8/hr, during REM-27.3/hr. RDI-25.0/hr, during REM-40.9/hr. avg o2 sat during REM and NREM 95%  . OSA (obstructive sleep apnea) 02/10/2017  . Pelvic mass in female 01/29/2013   With ascites   . Pneumonia 10/02/2018  . Rheumatoid arthritis ([place])   . Rheumatoid arthritis, 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[place]) 10/08/2015   Past Surgical History:  Procedure Laterality Date  . CARDIOVASCULAR STRESS TEST  02/16/2013   no significant EKG changes with Lexiscan, normal LV function and normal wall function  . CESAREAN SECTION     x2  . DOPPLER ECHOCARDIOGRAPHY  01/24/2008   EF 55-60%, no diagnostic evidence of LV wall motion abnormalities, LV wall thickness was mild-moderately increased.  Marland Kitchen LAPAROTOMY Right 02/27/2013   Procedure: EXPLORATORY LAPAROTOMY, right salpingo-oopherectomy;  Surgeon: [name], MD;  Location: [facility];  Service: Gynecology;   Laterality: Right;  . left breast cyst    . SALPINGOOPHORECTOMY Right 02/27/2013   Procedure: SALPINGO OOPHORECTOMY;  Surgeon: [name], MD;  Location: [facility];  Service: Gynecology;  Laterality: Right;   Patient Active Problem List   Diagnosis Date Noted  . HCAP (healthcare-associated pneumonia) 10/02/2018  . CHF exacerbation ([place]) 10/01/2018  . Community acquired pneumonia 10/01/2018  . OSA (obstructive sleep apnea) 02/10/2017  . Carpal tunnel syndrome, right 12/15/2016  . Extensor tenosynovitis of wrist, right 12/15/2016  . Bilateral primary osteoarthritis of knee 10/08/2015  . Rheumatoid arthritis, 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[place]) 10/08/2015  . Acute on chronic diastolic CHF (congestive heart failure) ([place]) 08/08/2015  . Morbid obesity ([place]) 05/06/2015  . Essential hypertension 04/10/2015  . 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 Primary osteoarthritis involving multiple joints 04/09/2015  . Elevated uric acid in blood 01/23/2014  . High risk medication use 01/23/2014  . Numbness and tingling in left hand 10/01/2013  . Tobacco abuse 10/01/2013  . Elevated CA-125 02/06/2013  . Pelvic mass in female 01/29/2013  . Diabetes mellitus ([place]) 08/10/2012  . Atrial fibrillation ([place])   . Chronic diastolic CHF (congestive heart failure) ([facility])     Current Outpatient Medications:  .  Abatacept 125 MG/ML SOAJ, INJECT THE CONTENTS OF 1 PEN INTO THE SKIN EVERY 7 DAYS., Disp: , Rfl:  .  atorvastatin (LIPITOR) 20 MG tablet, Take 1 tablet (20 mg total) by mouth daily., Disp: 90 tablet, Rfl: 3 .  blood glucose meter kit and supplies, Dispense based on patient and insurance preference. Use up to four times daily  as directed. (FOR ICD-10 E10.9, E11.9)., Disp: 1 each, Rfl: 0 .  diclofenac sodium (VOLTAREN) 1 % GEL, Apply 2 g topically 4 (four) times daily., Disp: 1 Tube, Rfl: 0 .  ELIQUIS 5 MG TABS tablet, TAKE 1 TABLET BY MOUTH TWICE A DAY, Disp: 60 tablet, Rfl: 6 .  folic acid  (FOLVITE) 1 MG tablet, Take 1 mg by mouth daily., Disp: , Rfl: 3 .  furosemide (LASIX) 40 MG tablet, TAKE 1 TABLET BY MOUTH TWICE A DAY, Disp: 180 tablet, Rfl: 2 .  losartan (COZAAR) 25 MG tablet, TAKE 1 TABLET BY MOUTH EVERY DAY, Disp: 90 tablet, Rfl: 3 .  metFORMIN (GLUCOPHAGE) 500 MG tablet, Take 2 tablets (1,000 mg total) by mouth 2 (two) times daily., Disp: 60 tablet, Rfl: 0 .  methotrexate (RHEUMATREX) 2.5 MG tablet, Take 10 mg by mouth every Sunday. Caution:Chemotherapy. Protect from light. , Disp: , Rfl:  .  metoprolol tartrate (LOPRESSOR) 25 MG tablet, Take 0.5 tablets (12.5 mg total) by mouth 2 (two) times daily., Disp: 90 tablet, Rfl: 2 .  NOVOLOG MIX 70/30 FLEXPEN (70-30) 100 UNIT/ML FlexPen, Inject 0.4 mLs (40 Units total) into the skin 2 (two) times daily with a meal., Disp: 15 mL, Rfl: 0 .  predniSONE (DELTASONE) 5 MG tablet, Take 3 tablets (15 mg total) by mouth daily with breakfast. 15 mg daily for a week then reevaluate by PCP, Disp: 30 tablet, Rfl: 0 .  traMADol (ULTRAM) 50 MG tablet, Take 1 tablet (50 mg total) by mouth every 6 (six) hours as needed for moderate pain., Disp: 20 tablet, Rfl: 0 Allergies  Allergen Reactions  . Ace Inhibitors Cough  . Lisinopril Cough   Social History   Tobacco Use  Smoking Status Former Smoker  . Packs/day: 0.75  . Years: 24.00  . Pack years: 18.00  . Types: Cigarettes  . Quit date: 06/08/2014  . Years since quitting: 5.9  Smokeless Tobacco Never Used  Tobacco Comment   smoking cessation info given.    Objective:  There were no vitals filed for this visit. Constitutional Patient is a pleasant 59 y.o. African American female, in NAD.Marland Kitchen  Vascular Capillary fill time to digits <3 seconds b/l lower extremities. Faintly palpable pedal pulses b/l. Pedal hair absent. Lower extremity skin temperature gradient within normal limits. No pain with calf compression b/l. No edema noted b/l lower extremities. No ischemia or gangrene noted b/l lower  extremities. Capillary refill normal to all digits.  No cyanosis or clubbing noted.  Neurologic Normal speech. Oriented to person, place, and time. Protective sensation diminished with 10g monofilament b/l. Vibratory sensation diminished b/l. Babinski reflex negative b/l. Clonus negative b/l.  Dermatologic Pedal skin with normal turgor, texture and tone bilaterally. No open wounds bilaterally. No interdigital macerations bilaterally. Toenails L hallux, L 2nd toe, L 3rd toe, L 4th toe, R hallux, R 2nd toe and R 3rd toe elongated, discolored, dystrophic, thickened, and crumbly with subungual debris and tenderness to dorsal palpation. Remnants of mycoticl nail growth of b/l 5th toes and right 4th digit.  Orthopedic: Normal muscle strength 5/5 to all lower extremity muscle groups bilaterally. No pain crepitus or joint limitation noted with ROM b/l. No gross bony deformities bilaterally.   Radiographs: None Assessment:   1. Pain due to onychomycosis of toenails of both feet   2. Diabetic neuropathy with neurologic complication [facility])    Plan:  Patient was evaluated and treated and all questions answered.  Onychomycosis with pain -Nails palliatively debridement as  below. -Educated on self-care  Procedure: Nail Debridement Rationale: Pain Type of Debridement: manual, sharp debridement. Instrumentation: Nail nipper, rotary burr. Number of Nails: 10  -Examined patient. -No new findings. No new orders. -Continue diabetic foot care principles. -Toenails 1-5 b/l were debrided in length and girth with sterile nail nippers and dremel without iatrogenic bleeding.  -Patient to report any pedal injuries to medical professional immediately. -Patient to continue soft, supportive shoe gear daily. -Patient/POA to call should there be question/concern in the interim.  Return in about 3 months (around 08/28/2020) for diabetic nail trim.  Marzetta Board, DPM

## 2020-06-02 ENCOUNTER — Ambulatory Visit: Payer: Medicaid Other | Admitting: Physical Therapy

## 2020-06-04 ENCOUNTER — Ambulatory Visit: Payer: Medicaid Other | Admitting: Physical Therapy

## 2020-06-04 ENCOUNTER — Telehealth: Payer: Self-pay | Admitting: Physical Therapy

## 2020-06-04 NOTE — Telephone Encounter (Signed)
Attempted to contact patient regarding no show to appointment today. Left reminder of appointments next week.

## 2020-06-09 ENCOUNTER — Ambulatory Visit: Payer: Medicaid Other | Attending: Internal Medicine | Admitting: Physical Therapy

## 2020-06-11 ENCOUNTER — Telehealth: Payer: Self-pay | Admitting: Physical Therapy

## 2020-06-11 ENCOUNTER — Ambulatory Visit: Payer: Medicaid Other | Admitting: Physical Therapy

## 2020-06-11 NOTE — Telephone Encounter (Signed)
Called pt in regards to her missed 10:00am appointment today.  Pt states she had 2 falls since the last time she saw therapy and has been unable to come. Discussed with pt that due to not canceling via phone, per attendance policy after 3 no-shows she is to be discharged from therapy and she will need a doctor to re-order it if she wants to resume. Pt agreeable to d/c at this time.  E[facility] Pitta April Gordy Levan, PT, DPT

## 2020-06-17 NOTE — Progress Notes (Signed)
CARDIOLOGY OFFICE NOTE  Date:  06/25/2020    Kathleen Kane Date of Birth: 21-May-1961 Medical Record #[id number]  PCP:  [name], MD  Cardiologist:  Gillian Shields    No chief complaint on file.   History of Present Illness: Kathleen Kane is a 59 y.o. female who presents today for a follow up visit.   She has a history of persistent AF - on Eliquis, diastolic HF, OSA (does not use CPAP), HTN, DM, RA, obesity and prior tobacco abuse. Normal Myoview from 2014. Activity limited by RA. 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 DM poorly controlled A1c 8.0    The patient does not have symptoms concerning for COVID-19 infection (fever, chills, cough, or new shortness of breath).   She has not had vaccine Long discussion about benefits and low risks .   Past Medical History:  Diagnosis Date  . Acute on chronic diastolic CHF (congestive heart failure) ([place]) 08/08/2015  . Anxiety   . Atrial fibrillation, persistent ([place])   . Carpal tunnel syndrome, right 12/15/2016  . CHF (congestive heart failure) ([place]) 2013   No echo found from that time, most likely diastolic  . CHF exacerbation ([place]) 10/01/2018  . Depression   . Diabetes mellitus ([place]) 08/10/2012  . Diabetes mellitus without complication ([place])   . Hypertension   . 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 Morbid obesity ([place]) 05/06/2015  . Obesity   . Obstructive sleep apnea    Sleep study performed 10/18/2008. AHI-6.8/hr, during REM-27.3/hr. RDI-25.0/hr, during REM-40.9/hr. avg o2 sat during REM and NREM 95%  . OSA (obstructive sleep apnea) 02/10/2017  . Pelvic mass in female 01/29/2013   With ascites   . Pneumonia 10/02/2018  . Rheumatoid arthritis ([place])   . Rheumatoid arthritis, 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[place]) 10/08/2015    Past Surgical History:  Procedure  Laterality Date  . CARDIOVASCULAR STRESS TEST  02/16/2013   no significant EKG changes with Lexiscan, normal LV function and normal wall function  . CESAREAN SECTION     x2  . DOPPLER ECHOCARDIOGRAPHY  01/24/2008   EF 55-60%, no diagnostic evidence of LV wall motion abnormalities, LV wall thickness was mild-moderately increased.  Marland Kitchen LAPAROTOMY Right 02/27/2013   Procedure: EXPLORATORY LAPAROTOMY, right salpingo-oopherectomy;  Surgeon: [name], MD;  Location: [facility];  Service: Gynecology;  Laterality: Right;  . left breast cyst    . SALPINGOOPHORECTOMY Right 02/27/2013   Procedure: SALPINGO OOPHORECTOMY;  Surgeon: [name], MD;  Location: [facility];  Service: Gynecology;  Laterality: Right;     Medications: Current Meds  Medication Sig  . Abatacept 125 MG/ML SOAJ INJECT THE CONTENTS OF 1 PEN INTO THE SKIN EVERY 7 DAYS.  Marland Kitchen atorvastatin (LIPITOR) 20 MG tablet Take 1 tablet (20 mg total) by mouth daily.  . blood glucose meter kit and supplies Dispense based on patient and insurance preference. Use up to four times daily as directed. (FOR ICD-10 E10.9, E11.9).  Marland Kitchen diclofenac sodium (VOLTAREN) 1 % GEL Apply 2 g topically 4 (four) times daily.  Marland Kitchen ELIQUIS 5 MG TABS tablet TAKE 1 TABLET BY MOUTH TWICE A DAY  . folic acid (FOLVITE) 1 MG tablet Take 1 mg by mouth daily.  . furosemide (LASIX) 40 MG tablet TAKE 1 TABLET BY MOUTH TWICE A DAY  . losartan (COZAAR) 25  MG tablet TAKE 1 TABLET BY MOUTH EVERY DAY  . metFORMIN (GLUCOPHAGE) 500 MG tablet Take 2 tablets (1,000 mg total) by mouth 2 (two) times daily.  . methotrexate (RHEUMATREX) 2.5 MG tablet Take 10 mg by mouth every Sunday. Caution:Chemotherapy. Protect from light.   . metoprolol tartrate (LOPRESSOR) 25 MG tablet Take 0.5 tablets (12.5 mg total) by mouth 2 (two) times daily.  Marland Kitchen NOVOLOG MIX 70/30 FLEXPEN (70-30) 100 UNIT/ML FlexPen Inject 0.4 mLs (40 Units total) into the skin 2 (two) times daily with a meal.     Allergies: Allergies   Allergen Reactions  . Ace Inhibitors Cough  . Lisinopril Cough    Social History: The patient  reports that she quit smoking about 6 years ago. Her smoking use included cigarettes. She has a 18.00 pack-year smoking history. She has never used smokeless tobacco. She reports that she does not drink alcohol and does not use drugs.   Family History: The patient's family history includes Diabetes in her brother, mother, paternal grandmother, sister, and another family member; Heart Problems in her sister; Hypertension in her brother, father, and mother; Kidney failure in her sister.   Review of Systems: Please see the history of present illness.   All other systems are reviewed and negative.   Physical Exam: VS:  BP 122/72   Pulse 97   Ht _0  (1.575 m)   Wt 243 lb (110.2 kg)   LMP 10/28/2013 Comment: perimenopausal  SpO2 95%   BMI 44.45 kg/m  .  BMI Body mass index is 44.45 kg/m.  Wt Readings from Last 3 Encounters:  06/25/20 243 lb (110.2 kg)  02/11/20 245 lb 12.8 oz (111.5 kg)  12/29/18 245 lb (111.1 kg)   Affect appropriate Overweight black female  HEENT: normal Neck supple with no adenopathy JVP normal no bruits no thyromegaly Lungs clear with no wheezing and good diaphragmatic motion Heart:  S1/S2 no murmur, no rub, gallop or click PMI normal Abdomen: benighn, BS positve, no tenderness, no AAA no bruit.  No HSM or HJR Distal pulses intact with no bruits No edema Neuro non-focal Skin warm and dry No muscular weakness    LABORATORY DATA:  EKG:  EKG is ordered today.  Personally reviewed by me. This demonstrates AF with controlled VR - HR is 87.  Lab Results  Component Value Date   WBC 6.6 12/29/2018   HGB 12.9 12/29/2018   HCT 45.7 12/29/2018   PLT 291 12/29/2018   GLUCOSE 140 (H) 12/29/2018   CHOL  01/24/2008    155        ATP III CLASSIFICATION:  <200     mg/dL   Desirable  200-239  mg/dL   Borderline High  >=240    mg/dL   High   TRIG 141  01/24/2008   HDL 30 (L) 01/24/2008   LDLCALC  01/24/2008    97        Total Cholesterol/HDL:CHD Risk Coronary Heart Disease Risk Table                     Men   Women  1/2 Average Risk   3.4   3.3   ALT 22 12/29/2018   AST 23 12/29/2018   NA 138 12/29/2018   K 3.7 12/29/2018   CL 105 12/29/2018   CREATININE 0.85 12/29/2018   BUN 20 12/29/2018   CO2 19 (L) 12/29/2018   TSH 1.162 Test methodology is 3rd generation TSH 01/23/2008  INR 1.08 01/10/2016   HGBA1C 8.5 (H) 10/02/2018     BNP (last 3 results) No results for input(s): BNP in the last 8760 hours.  ProBNP (last 3 results) No results for input(s): PROBNP in the last 8760 hours.   Other Studies Reviewed Today:  Echo Study Conclusions  02/26/20   1. Left ventricular ejection fraction, by estimation, is 55 to 60%. The  left ventricle has normal function. The left ventricle has no regional  wall motion abnormalities. There is mild concentric left ventricular  hypertrophy. Left ventricular diastolic  function could not be evaluated.  2. Right ventricular systolic function is normal. The right ventricular  size is normal. There is normal pulmonary artery systolic pressure. The  estimated right ventricular systolic pressure is 83.4 mmHg.  3. Left atrial size was mildly dilated.  4. Restricted PMVL with posteriorly directed jet. Mild to moderate MR.  The mitral valve is grossly normal. Mild to moderate mitral valve  regurgitation.  5. The aortic valve is tricuspid. Aortic valve regurgitation is not  visualized. No aortic stenosis is present.  6. The inferior vena cava is normal in size with greater than 50%  respiratory variability, suggesting right atrial pressure of 3 mmHg.   Comparison(s): No significant change from prior study. EF 55-60%. Mild to  moderate MR.   Carotid:  Plaque no stenosis 1-39%  02/15/20     Assessment/Plan:  1. 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 HLD -  LDL 81 03/24/20 started on lipitor 04/29/20 F/U labs ordered   4. Chronic diastolic dysfunction - BP is ok.  Echo EF 19-62% mild LVH diastolic indeterminate due to afib  5. Valvular heart disease - mild to moderate MR see above f/u echo per symptoms   6. DM - poorly controlled A1c 8.0 f/u primary    8. COVID-19 Education: The signs and symptoms of COVID-19 were discussed with the patient and how to seek care for testing (follow up with PCP or arrange E-visit).  The importance of social distancing, staying at home, hand hygiene and wearing a mask when out in public were discussed today.  Current medicines are reviewed with the patient today.  The patient does not have concerns regarding medicines other than what has been noted above.  The following changes have been made:  See above.  Labs/ tests ordered today include:    No orders of the defined types were placed in this encounter.    Disposition:   FU with [place] in a year      Patient is agreeable to this plan and will call if any problems develop in the interim.   Signed: Jenkins Rouge, MD  06/25/2020 10:41 AM  [facility] [address] [place], [place]  [address] Phone: [phone] Fax: [phone]

## 2020-06-23 ENCOUNTER — Ambulatory Visit: Payer: Medicaid Other | Admitting: Cardiovascular Disease

## 2020-06-25 ENCOUNTER — Other Ambulatory Visit: Payer: Self-pay

## 2020-06-25 ENCOUNTER — Encounter: Payer: Self-pay | Admitting: Cardiovascular Disease

## 2020-06-25 ENCOUNTER — Ambulatory Visit: Payer: Medicaid Other | Admitting: Cardiovascular Disease

## 2020-06-25 VITALS — BP 122/72 | HR 97 | Ht 62.0 in | Wt 243.0 lb

## 2020-06-25 DIAGNOSIS — E785 Hyperlipidemia, unspecified: Secondary | ICD-10-CM | POA: Diagnosis not present

## 2020-06-25 DIAGNOSIS — I4819 Other persistent atrial fibrillation: Secondary | ICD-10-CM | POA: Diagnosis not present

## 2020-06-25 NOTE — Patient Instructions (Signed)
Medication Instructions:  *If you need a refill on your cardiac medications before your next appointment, please call your pharmacy*  Lab Work: Your physician recommends that you return for lab work in: 1 month for fasting lipid and liver panel  If you have labs (blood work) drawn today and your tests are completely normal, you will receive your results only by: Marland Kitchen MyChart Message (if you have MyChart) OR . A paper copy in the mail If you have any lab test that is abnormal or we need to change your treatment, we will call you to review the results.   Follow-Up: At [facility], you and your health needs are our priority.  As part of our continuing mission to provide you with exceptional heart care, we have created designated Provider Care Teams.  These Care Teams include your primary Cardiologist (physician) and Advanced Practice Providers (APPs -  Physician Assistants and Nurse Practitioners) who all work together to provide you with the care you need, when you need it.  We recommend signing up for the patient portal called "MyChart".  Sign up information is provided on this After Visit Summary.  MyChart is used to connect with patients for Virtual Visits (Telemedicine).  Patients are able to view lab/test results, encounter notes, upcoming appointments, etc.  Non-urgent messages can be sent to your provider as well.   To learn more about what you can do with MyChart, go to [email or web address].    Your next appointment:   1 year(s)  The format for your next appointment:   In Person  Provider:   You may see Jenkins Rouge, MD or one of the following Advanced Practice Providers on your designated Care Team:    Truitt Merle, NP  Cecilie Kicks, NP  Kathyrn Drown, NP

## 2020-07-21 ENCOUNTER — Other Ambulatory Visit: Payer: Medicaid Other | Admitting: *Deleted

## 2020-07-21 ENCOUNTER — Other Ambulatory Visit: Payer: Self-pay

## 2020-07-21 DIAGNOSIS — I4819 Other persistent atrial fibrillation: Secondary | ICD-10-CM

## 2020-07-21 DIAGNOSIS — E785 Hyperlipidemia, unspecified: Secondary | ICD-10-CM

## 2020-07-21 LAB — LIPID PANEL
Chol/HDL Ratio: 3.4 ratio (ref 0.0–4.4)
Cholesterol, Total: 100 mg/dL (ref 100–199)
HDL: 29 mg/dL — ABNORMAL LOW (ref >39)
LDL Chol Calc (NIH): 45 mg/dL (ref 0–99)
Triglycerides: 154 mg/dL — ABNORMAL HIGH (ref 0–149)
VLDL Cholesterol Cal: 26 mg/dL (ref 5–40)

## 2020-07-21 LAB — HEPATIC FUNCTION PANEL
ALT: 13 IU/L (ref 0–32)
AST: 11 IU/L (ref 0–40)
Albumin: 4 g/dL (ref 3.8–4.8)
Alkaline Phosphatase: 105 IU/L (ref 44–121)
Bilirubin Total: 0.5 mg/dL (ref 0.0–1.2)
Bilirubin, Direct: 0.19 mg/dL (ref 0.00–0.40)
Total Protein: 6.2 g/dL (ref 6.0–8.5)

## 2020-09-05 ENCOUNTER — Ambulatory Visit: Payer: Medicaid Other | Admitting: Podiatry

## 2020-09-05 ENCOUNTER — Encounter: Payer: Self-pay | Admitting: Podiatry

## 2020-09-05 ENCOUNTER — Other Ambulatory Visit: Payer: Self-pay

## 2020-09-05 DIAGNOSIS — E1149 Type 2 diabetes mellitus with other diabetic neurological complication: Secondary | ICD-10-CM | POA: Diagnosis not present

## 2020-09-05 DIAGNOSIS — E1140 Type 2 diabetes mellitus with diabetic neuropathy, unspecified: Secondary | ICD-10-CM | POA: Diagnosis not present

## 2020-09-05 DIAGNOSIS — B351 Tinea unguium: Secondary | ICD-10-CM | POA: Diagnosis not present

## 2020-09-05 DIAGNOSIS — M79674 Pain in right toe(s): Secondary | ICD-10-CM

## 2020-09-05 DIAGNOSIS — M79675 Pain in left toe(s): Secondary | ICD-10-CM

## 2020-09-07 NOTE — Progress Notes (Signed)
Subjective:  Patient ID: Kathleen Kane, female    DOB: [birth year],  MRN: [id number]  59 y.o. female presents with preventative diabetic foot care and painful thick toenails that are difficult to trim. Pain interferes with ambulation. Aggravating factors include wearing enclosed shoe gear. Pain is relieved with periodic professional debridement..   She voices no new pedal problems on today's visit.   Review of Systems: Negative except as noted in the HPI. Denies N/V/F/Ch. Past Medical History:  Diagnosis Date  . Acute on chronic diastolic CHF (congestive heart failure) ([place]) 08/08/2015  . Anxiety   . Atrial fibrillation, persistent ([place])   . Carpal tunnel syndrome, right 12/15/2016  . CHF (congestive heart failure) ([place]) 2013   No echo found from that time, most likely diastolic  . CHF exacerbation ([place]) 10/01/2018  . Depression   . Diabetes mellitus ([place]) 08/10/2012  . Diabetes mellitus without complication ([place])   . Hypertension   . 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 Morbid obesity ([place]) 05/06/2015  . Obesity   . Obstructive sleep apnea    Sleep study performed 10/18/2008. AHI-6.8/hr, during REM-27.3/hr. RDI-25.0/hr, during REM-40.9/hr. avg o2 sat during REM and NREM 95%  . OSA (obstructive sleep apnea) 02/10/2017  . Pelvic mass in female 01/29/2013   With ascites   . Pneumonia 10/02/2018  . Rheumatoid arthritis ([place])   . Rheumatoid arthritis, 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[place]) 10/08/2015   Past Surgical History:  Procedure Laterality Date  . CARDIOVASCULAR STRESS TEST  02/16/2013   no significant EKG changes with Lexiscan, normal LV function and normal wall function  . CESAREAN SECTION     x2  . DOPPLER ECHOCARDIOGRAPHY  01/24/2008   EF 55-60%, no diagnostic evidence of LV wall motion abnormalities, LV wall thickness was mild-moderately increased.  Marland Kitchen LAPAROTOMY Right 02/27/2013   Procedure: EXPLORATORY LAPAROTOMY, right salpingo-oopherectomy;  Surgeon: [name], MD;  Location: [facility];  Service: Gynecology;  Laterality: Right;  . left breast cyst    . SALPINGOOPHORECTOMY Right 02/27/2013   Procedure: SALPINGO OOPHORECTOMY;  Surgeon: [name], MD;  Location: [facility];  Service: Gynecology;  Laterality: Right;   Patient Active Problem List   Diagnosis Date Noted  . HCAP (healthcare-associated pneumonia) 10/02/2018  . CHF exacerbation ([place]) 10/01/2018  . Community acquired pneumonia 10/01/2018  . OSA (obstructive sleep apnea) 02/10/2017  . Carpal tunnel syndrome, right 12/15/2016  . Extensor tenosynovitis of wrist, right 12/15/2016  . Bilateral primary osteoarthritis of knee 10/08/2015  . Rheumatoid arthritis, 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[place]) 10/08/2015  . Acute on chronic diastolic CHF (congestive heart failure) ([place]) 08/08/2015  . Morbid obesity ([place]) 05/06/2015  . Essential hypertension 04/10/2015  . 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 Primary osteoarthritis involving multiple joints 04/09/2015  . Elevated uric acid in blood 01/23/2014  . High risk medication use 01/23/2014  . Numbness and tingling in left hand 10/01/2013  . Tobacco abuse 10/01/2013  . Elevated CA-125 02/06/2013  . Pelvic mass in female 01/29/2013  . Diabetes mellitus ([place]) 08/10/2012  . Atrial fibrillation ([place])   . Chronic diastolic CHF (congestive heart failure) ([facility])     Current Outpatient Medications:  .  Abatacept 125 MG/ML SOAJ, INJECT THE CONTENTS OF 1 PEN INTO THE SKIN EVERY 7 DAYS., Disp: , Rfl:  .  Accu-Chek FastClix Lancets MISC, Apply topically., Disp: , Rfl:  .  ACCU-CHEK GUIDE test strip, , Disp: , Rfl:  .  B-D ULTRAFINE III SHORT PEN 31G X  8 MM MISC, Inject into the skin 2 (two) times daily., Disp: , Rfl:  .  blood glucose meter kit and supplies, Dispense based on patient and insurance preference. Use up to four times daily as directed. (FOR ICD-10 E10.9, E11.9)., Disp: 1 each, Rfl: 0 .  [employer] PAIN RELIEF EXTRA STRENGTH 500 MG  tablet, Take 1,000 mg by mouth 3 (three) times daily as needed., Disp: , Rfl:  .  diclofenac sodium (VOLTAREN) 1 % GEL, Apply 2 g topically 4 (four) times daily., Disp: 1 Tube, Rfl: 0 .  ELIQUIS 5 MG TABS tablet, TAKE 1 TABLET BY MOUTH TWICE A DAY, Disp: 60 tablet, Rfl: 6 .  folic acid (FOLVITE) 1 MG tablet, Take 1 mg by mouth daily., Disp: , Rfl: 3 .  furosemide (LASIX) 40 MG tablet, TAKE 1 TABLET BY MOUTH TWICE A DAY, Disp: 180 tablet, Rfl: 2 .  losartan (COZAAR) 25 MG tablet, TAKE 1 TABLET BY MOUTH EVERY DAY, Disp: 90 tablet, Rfl: 3 .  metFORMIN (GLUCOPHAGE) 500 MG tablet, Take 2 tablets (1,000 mg total) by mouth 2 (two) times daily., Disp: 60 tablet, Rfl: 0 .  methotrexate (RHEUMATREX) 2.5 MG tablet, Take 8 tablets by mouth once a week., Disp: , Rfl:  .  metoprolol tartrate (LOPRESSOR) 25 MG tablet, Take 0.5 tablets (12.5 mg total) by mouth 2 (two) times daily., Disp: 90 tablet, Rfl: 2 .  NOVOLOG MIX 70/30 FLEXPEN (70-30) 100 UNIT/ML FlexPen, Inject 0.4 mLs (40 Units total) into the skin 2 (two) times daily with a meal., Disp: 15 mL, Rfl: 0 .  predniSONE (DELTASONE) 5 MG tablet, Take 5 mg by mouth daily., Disp: , Rfl:  .  triamcinolone cream (KENALOG) 0.5 %, Apply topically., Disp: , Rfl:  .  atorvastatin (LIPITOR) 20 MG tablet, Take 1 tablet (20 mg total) by mouth daily., Disp: 90 tablet, Rfl: 3 Allergies  Allergen Reactions  . Ace Inhibitors Cough  . Lisinopril Cough   Social History   Tobacco Use  Smoking Status Former Smoker  . Packs/day: 0.75  . Years: 24.00  . Pack years: 18.00  . Types: Cigarettes  . Quit date: 06/08/2014  . Years since quitting: 6.2  Smokeless Tobacco Never Used  Tobacco Comment   smoking cessation info given.    Objective:  There were no vitals filed for this visit. Constitutional Patient is a pleasant 59 y.o. African American female, in NAD.Marland Kitchen  Vascular Capillary fill time to digits <3 seconds b/l lower extremities. Faintly palpable pedal pulses b/l.  Pedal hair absent. Lower extremity skin temperature gradient within normal limits. No pain with calf compression b/l. No edema noted b/l lower extremities. No ischemia or gangrene noted b/l lower extremities. Capillary refill normal to all digits. No cyanosis or clubbing noted.  Neurologic Normal speech. Oriented to person, place, and time. Protective sensation diminished with 10g monofilament b/l. Vibratory sensation diminished b/l. Babinski reflex negative b/l. Clonus negative b/l.  Dermatologic Pedal skin with normal turgor, texture and tone bilaterally. No open wounds bilaterally. No interdigital macerations bilaterally. Toenails L hallux, L 2nd toe, L 3rd toe, L 4th toe, R hallux, R 2nd toe and R 3rd toe elongated, discolored, dystrophic, thickened, and crumbly with subungual debris and tenderness to dorsal palpation. Remnants of mycoticl nail growth of b/l 5th toes and right 4th digit.  Orthopedic: Normal muscle strength 5/5 to all lower extremity muscle groups bilaterally. No pain crepitus or joint limitation noted with ROM b/l. No gross bony deformities bilaterally.   Radiographs:  None Assessment:   1. Pain due to onychomycosis of toenails of both feet   2. Diabetic neuropathy with neurologic complication [facility])    Plan:  Patient was evaluated and treated and all questions answered.  Onychomycosis with pain -Nails palliatively debridement as below. -Educated on self-care  Procedure: Nail Debridement Rationale: Pain Type of Debridement: manual, sharp debridement. Instrumentation: Nail nipper, rotary burr. Number of Nails: 10  -Examined patient. -No new findings. No new orders. -Continue diabetic foot care principles. -Toenails 1-5 b/l were debrided in length and girth with sterile nail nippers and dremel without iatrogenic bleeding.  -Patient to report any pedal injuries to medical professional immediately. -Patient to continue soft, supportive shoe gear daily. -Patient/POA to  call should there be question/concern in the interim.  Return in about 3 months (around 12/06/2020) for diabetic foot care.  Marzetta Board, DPM

## 2020-11-05 ENCOUNTER — Encounter: Payer: Self-pay | Admitting: Podiatry

## 2020-11-05 ENCOUNTER — Ambulatory Visit (INDEPENDENT_AMBULATORY_CARE_PROVIDER_SITE_OTHER): Payer: Medicaid Other | Admitting: Podiatry

## 2020-11-05 ENCOUNTER — Other Ambulatory Visit: Payer: Self-pay

## 2020-11-05 DIAGNOSIS — M6740 Ganglion, unspecified site: Secondary | ICD-10-CM | POA: Diagnosis not present

## 2020-11-05 DIAGNOSIS — E1140 Type 2 diabetes mellitus with diabetic neuropathy, unspecified: Secondary | ICD-10-CM | POA: Diagnosis not present

## 2020-11-05 DIAGNOSIS — E1149 Type 2 diabetes mellitus with other diabetic neurological complication: Secondary | ICD-10-CM

## 2020-11-05 NOTE — Progress Notes (Signed)
Subjective:   Patient ID: Kathleen Kane, female   DOB: 59 y.o.   MRN: [id number]   HPI Patient presents stating that she was concerned about some nodules on her feet and also wanted her feet checked for diabetic issues.  States that she is trying to take good care of herself but wants to be checked and presents with caregiver   ROS      Objective:  Physical Exam  Neurovascular status was found to be unchanged from previous visits with patient having good range of motion subtalar midtarsal joint and dry skin but no indications of breaks in skin with full thorough evaluation today.  Patient is noted to have small nodules on the hallux metatarsal joint and the extensor tendon right over left     Assessment:  Probability that this is related to low-grade cyst formation with no indications of significant pathology with diabetes which appears to be under good control currently     Plan:  H&P reviewed all conditions discussed continued conservative care for this patient and utilizing heat treatment for the dorsal tissue.  Explained compression and patient will be seen back to recheck

## 2020-11-18 ENCOUNTER — Other Ambulatory Visit: Payer: Self-pay | Admitting: Rheumatology

## 2020-11-18 DIAGNOSIS — M059 Rheumatoid arthritis with rheumatoid factor, unspecified: Secondary | ICD-10-CM

## 2020-11-20 NOTE — Progress Notes (Deleted)
CARDIOLOGY OFFICE NOTE  Date:  11/20/2020    Kathleen Kane Date of Birth: [birth year] Medical Record #[id number]  PCP:  [name], MD  Cardiologist:  Gillian Shields    No chief complaint on file.   History of Present Illness: Kathleen Kane is a 60 y.o. female who presents today for a 6 month check. Seen for Dr. Johnsie Cancel.   She has a history of persistent AF - on Eliquis, diastolic HF, OSA (does not use CPAP), HTN, DM, RA, obesity and prior tobacco abuse. Normal Myoview from 2014. Activity limited by RA. 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 Echo benign with stable mild / moderate MR and normal EF. Diabetes has been poorly controlled.   Last seen by Dr. Johnsie Cancel in August.   Comes in today. Here with   Past Medical History:  Diagnosis Date  . Acute on chronic diastolic CHF (congestive heart failure) ([place]) 08/08/2015  . Anxiety   . Atrial fibrillation, persistent ([place])   . Carpal tunnel syndrome, right 12/15/2016  . CHF (congestive heart failure) ([place]) 2013   No echo found from that time, most likely diastolic  . CHF exacerbation ([place]) 10/01/2018  . Depression   . Diabetes mellitus ([place]) 08/10/2012  . Diabetes mellitus without complication ([place])   . Hypertension   . 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 Morbid obesity ([place]) 05/06/2015  . Obesity   . Obstructive sleep apnea    Sleep study performed 10/18/2008. AHI-6.8/hr, during REM-27.3/hr. RDI-25.0/hr, during REM-40.9/hr. avg o2 sat during REM and NREM 95%  . OSA (obstructive sleep apnea) 02/10/2017  . Pelvic mass in female 01/29/2013   With ascites   . Pneumonia 10/02/2018  . Rheumatoid arthritis ([place])   . Rheumatoid arthritis, 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[place]) 10/08/2015    Past Surgical History:  Procedure Laterality Date  . CARDIOVASCULAR STRESS TEST  02/16/2013   no significant EKG changes with Lexiscan, normal LV function and  normal wall function  . CESAREAN SECTION     x2  . DOPPLER ECHOCARDIOGRAPHY  01/24/2008   EF 55-60%, no diagnostic evidence of LV wall motion abnormalities, LV wall thickness was mild-moderately increased.  Marland Kitchen LAPAROTOMY Right 02/27/2013   Procedure: EXPLORATORY LAPAROTOMY, right salpingo-oopherectomy;  Surgeon: [name], MD;  Location: [facility];  Service: Gynecology;  Laterality: Right;  . left breast cyst    . SALPINGOOPHORECTOMY Right 02/27/2013   Procedure: SALPINGO OOPHORECTOMY;  Surgeon: [name], MD;  Location: [facility];  Service: Gynecology;  Laterality: Right;     Medications: No outpatient medications have been marked as taking for the 12/04/20 encounter (Appointment) with Burtis Junes, NP.     Allergies: Allergies  Allergen Reactions  . Ace Inhibitors Cough  . Lisinopril Cough    Social History: The patient  reports that she quit smoking about 6 years ago. Her smoking use included cigarettes. She has a 18.00 pack-year smoking history. She has never used smokeless tobacco. She reports that she does not drink alcohol and does not use drugs.   Family History: The patient's ***family history includes Diabetes in her brother, mother, paternal grandmother, sister, and another family member; Heart Problems in her sister; Hypertension in her brother, father, and mother; Kidney failure in her sister.   Review of Systems: Please see the history of present illness.   All other systems are reviewed and negative.   Physical Exam: VS:  LMP 10/28/2013 Comment: perimenopausal .  BMI There is no height or weight on file to calculate BMI.  Wt Readings from Last 3 Encounters:  06/25/20 243 lb (110.2 kg)  02/11/20 245 lb 12.8 oz (111.5 kg)  12/29/18 245 lb (111.1 kg)    General: Pleasant. Well developed, well nourished and in no acute distress.   HEENT: Normal.  Neck: Supple, no JVD, carotid bruits, or masses noted.  Cardiac: ***Regular rate and rhythm. No murmurs, rubs,  or gallops. No edema.  Respiratory:  Lungs are clear to auscultation bilaterally with normal work of breathing.  GI: Soft and nontender.  MS: No deformity or atrophy. Gait and ROM intact.  Skin: Warm and dry. Color is normal.  Neuro:  Strength and sensation are intact and no gross focal deficits noted.  Psych: Alert, appropriate and with normal affect.   LABORATORY DATA:  EKG:  EKG {ACTION; IS/IS GI:[id number] ordered today.  Personally reviewed by me. This demonstrates ***.  Lab Results  Component Value Date   WBC 6.6 12/29/2018   HGB 12.9 12/29/2018   HCT 45.7 12/29/2018   PLT 291 12/29/2018   GLUCOSE 140 (H) 12/29/2018   CHOL 100 07/21/2020   TRIG 154 (H) 07/21/2020   HDL 29 (L) 07/21/2020   LDLCALC 45 07/21/2020   ALT 13 07/21/2020   AST 11 07/21/2020   NA 138 12/29/2018   K 3.7 12/29/2018   CL 105 12/29/2018   CREATININE 0.85 12/29/2018   BUN 20 12/29/2018   CO2 19 (L) 12/29/2018   TSH 1.162 ***Test methodology is 3rd generation TSH*** 01/23/2008   INR 1.08 01/10/2016   HGBA1C 8.0 (H) 02/11/2020     BNP (last 3 results) No results for input(s): BNP in the last 8760 hours.  ProBNP (last 3 results) No results for input(s): PROBNP in the last 8760 hours.   Other Studies Reviewed Today:  Echo Study Conclusions  02/26/20     1. Left ventricular ejection fraction, by estimation, is 55 to 60%. The  left ventricle has normal function. The left ventricle has no regional  wall motion abnormalities. There is mild concentric left ventricular  hypertrophy. Left ventricular diastolic  function could not be evaluated.   2. Right ventricular systolic function is normal. The right ventricular  size is normal. There is normal pulmonary artery systolic pressure. The  estimated right ventricular systolic pressure is [id number] mmHg.   3. Left atrial size was mildly dilated.   4. Restricted PMVL with posteriorly directed jet. Mild to moderate MR.  The mitral valve is grossly  normal. Mild to moderate mitral valve  regurgitation.   5. The aortic valve is tricuspid. Aortic valve regurgitation is not  visualized. No aortic stenosis is present.   6. The inferior vena cava is normal in size with greater than 50%  respiratory variability, suggesting right atrial pressure of 3 mmHg.   Comparison(s): No significant change from prior study. EF 55-60%. Mild to  moderate MR.   Carotid:  Plaque no stenosis 1-39%  02/15/20        Assessment/Plan:   1. 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 HLD -  LDL 81 03/24/20 started on lipitor 04/29/20 F/U labs ordered    4. Chronic diastolic dysfunction - BP is ok.  Echo EF 0000000 mild LVH diastolic indeterminate due to afib   5. Valvular heart  disease - mild to moderate MR see above f/u echo per symptoms    6. DM - poorly controlled A1c 8.0 f/u primary    Current medicines are reviewed with the patient today.  The patient does not have concerns regarding medicines other than what has been noted above.  The following changes have been made:  See above.  Labs/ tests ordered today include:   No orders of the defined types were placed in this encounter.    Disposition:   FU with *** in {gen number [id number]} {Days to years:10300}.   Patient is agreeable to this plan and will call if any problems develop in the interim.   SignedTruitt Merle, NP  11/20/2020 7:24 AM  [facility] [address] [place], [place]  [address] Phone: [phone] Fax: [phone]

## 2020-11-26 DIAGNOSIS — H6123 Impacted cerumen, bilateral: Secondary | ICD-10-CM | POA: Insufficient documentation

## 2020-11-26 DIAGNOSIS — H9201 Otalgia, right ear: Secondary | ICD-10-CM | POA: Insufficient documentation

## 2020-11-26 DIAGNOSIS — M26621 Arthralgia of right temporomandibular joint: Secondary | ICD-10-CM | POA: Insufficient documentation

## 2020-12-04 ENCOUNTER — Ambulatory Visit: Payer: Medicaid Other | Admitting: Nurse Practitioner

## 2020-12-08 NOTE — Progress Notes (Signed)
CARDIOLOGY OFFICE NOTE  Date:  12/09/2020    Kathleen Kane Date of Birth: [birth year] Medical Record #[id number]  PCP:  [name], MD  Cardiologist:  Gillian Shields    No chief complaint on file.   History of Present Illness: Kathleen Kane is a 60 y.o. female who presents today for a follow up visit.   She has a history of persistent AF - on Eliquis, diastolic HF, OSA (does not use CPAP), HTN, DM, RA, obesity and prior tobacco abuse. Normal Myoview from 2014. Activity limited by RA. 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 DM poorly controlled A1c 8.0   She has not had vaccine Long discussion about benefits and low risks . Ironically has had flu shot   No palpitations dyspnea or chest pain   Past Medical History:  Diagnosis Date  . Acute on chronic diastolic CHF (congestive heart failure) ([place]) 08/08/2015  . Anxiety   . Atrial fibrillation, persistent ([place])   . Carpal tunnel syndrome, right 12/15/2016  . CHF (congestive heart failure) ([place]) 2013   No echo found from that time, most likely diastolic  . CHF exacerbation ([place]) 10/01/2018  . Depression   . Diabetes mellitus ([place]) 08/10/2012  . Diabetes mellitus without complication ([place])   . Hypertension   . 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 Morbid obesity ([place]) 05/06/2015  . Obesity   . Obstructive sleep apnea    Sleep study performed 10/18/2008. AHI-6.8/hr, during REM-27.3/hr. RDI-25.0/hr, during REM-40.9/hr. avg o2 sat during REM and NREM 95%  . OSA (obstructive sleep apnea) 02/10/2017  . Pelvic mass in female 01/29/2013   With ascites   . Pneumonia 10/02/2018  . Rheumatoid arthritis ([place])   . Rheumatoid arthritis, 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[place]) 10/08/2015    Past Surgical History:  Procedure Laterality Date  . CARDIOVASCULAR STRESS TEST  02/16/2013    no significant EKG changes with Lexiscan, normal LV function and normal wall function  . CESAREAN SECTION     x2  . DOPPLER ECHOCARDIOGRAPHY  01/24/2008   EF 55-60%, no diagnostic evidence of LV wall motion abnormalities, LV wall thickness was mild-moderately increased.  Marland Kitchen LAPAROTOMY Right 02/27/2013   Procedure: EXPLORATORY LAPAROTOMY, right salpingo-oopherectomy;  Surgeon: [name], MD;  Location: [facility];  Service: Gynecology;  Laterality: Right;  . left breast cyst    . SALPINGOOPHORECTOMY Right 02/27/2013   Procedure: SALPINGO OOPHORECTOMY;  Surgeon: [name], MD;  Location: [facility];  Service: Gynecology;  Laterality: Right;     Medications: Current Meds  Medication Sig  . Abatacept 125 MG/ML SOAJ INJECT THE CONTENTS OF 1 PEN INTO THE SKIN EVERY 7 DAYS.  Marland Kitchen Accu-Chek FastClix Lancets MISC Apply topically.  Marland Kitchen ACCU-CHEK GUIDE test strip   . B-D ULTRAFINE III SHORT PEN 31G X 8 MM MISC Inject into the skin 2 (two) times daily.  . blood glucose meter kit and supplies Dispense based on patient and insurance preference. Use up to four times daily as directed. (FOR ICD-10 E10.9, E11.9).  . [employer] PAIN RELIEF EXTRA STRENGTH 500 MG tablet Take 1,000 mg by mouth 3 (three) times daily as needed.  . diclofenac sodium (VOLTAREN) 1 % GEL Apply 2 g topically 4 (four) times daily.  Marland Kitchen ELIQUIS 5 MG TABS tablet TAKE 1 TABLET BY MOUTH TWICE A DAY  .  folic acid (FOLVITE) 1 MG tablet Take 1 mg by mouth daily.  . furosemide (LASIX) 40 MG tablet TAKE 1 TABLET BY MOUTH TWICE A DAY  . losartan (COZAAR) 25 MG tablet TAKE 1 TABLET BY MOUTH EVERY DAY  . metFORMIN (GLUCOPHAGE) 500 MG tablet Take 2 tablets (1,000 mg total) by mouth 2 (two) times daily.  . methotrexate (RHEUMATREX) 2.5 MG tablet Take 8 tablets by mouth once a week.  . metoprolol tartrate (LOPRESSOR) 25 MG tablet Take 0.5 tablets (12.5 mg total) by mouth 2 (two) times daily.  Marland Kitchen NOVOLOG MIX 70/30 FLEXPEN (70-30) 100 UNIT/ML FlexPen Inject 0.4 mLs  (40 Units total) into the skin 2 (two) times daily with a meal.  . predniSONE (DELTASONE) 5 MG tablet Take 5 mg by mouth daily.  Marland Kitchen triamcinolone cream (KENALOG) 0.5 % Apply topically.     Allergies: Allergies  Allergen Reactions  . Ace Inhibitors Cough  . Lisinopril Cough    Social History: The patient  reports that she quit smoking about 6 years ago. Her smoking use included cigarettes. She has a 18.00 pack-year smoking history. She has never used smokeless tobacco. She reports that she does not drink alcohol and does not use drugs.   Family History: The patient's family history includes Diabetes in her brother, mother, paternal grandmother, sister, and another family member; Heart Problems in her sister; Hypertension in her brother, father, and mother; Kidney failure in her sister.   Review of Systems: Please see the history of present illness.   All other systems are reviewed and negative.   Physical Exam: VS:  BP 100/82   Pulse 68   Ht 5' 2.75" (1.594 m)   Wt 104.8 kg   LMP 10/28/2013 Comment: perimenopausal  SpO2 98%   BMI 41.25 kg/m  .  BMI Body mass index is 41.25 kg/m.  Wt Readings from Last 3 Encounters:  12/09/20 104.8 kg  06/25/20 110.2 kg  02/11/20 111.5 kg   Affect appropriate Overweight black female  HEENT: normal Neck supple with no adenopathy JVP normal no bruits no thyromegaly Lungs clear with no wheezing and good diaphragmatic motion Heart:  S1/S2 apical MR  murmur, no rub, gallop or click PMI normal Abdomen: benighn, BS positve, no tenderness, no AAA no bruit.  No HSM or HJR Distal pulses intact with no bruits No edema Neuro non-focal Skin warm and dry No muscular weakness    LABORATORY DATA:  EKG:  EKG is ordered today.  Personally reviewed by me. This demonstrates AF with controlled VR - HR is 87.  Lab Results  Component Value Date   WBC 6.6 12/29/2018   HGB 12.9 12/29/2018   HCT 45.7 12/29/2018   PLT 291 12/29/2018   GLUCOSE 140  (H) 12/29/2018   CHOL  01/24/2008    155        ATP III CLASSIFICATION:  <200     mg/dL   Desirable  200-239  mg/dL   Borderline High  >=240    mg/dL   High   TRIG 141 01/24/2008   HDL 30 (L) 01/24/2008   LDLCALC  01/24/2008    97        Total Cholesterol/HDL:CHD Risk Coronary Heart Disease Risk Table                     Men   Women  1/2 Average Risk   3.4   3.3   ALT 22 12/29/2018   AST 23 12/29/2018  NA 138 12/29/2018   K 3.7 12/29/2018   CL 105 12/29/2018   CREATININE 0.85 12/29/2018   BUN 20 12/29/2018   CO2 19 (L) 12/29/2018   TSH 1.162 Test methodology is 3rd generation TSH 01/23/2008   INR 1.08 01/10/2016   HGBA1C 8.5 (H) 10/02/2018     BNP (last 3 results) No results for input(s): BNP in the last 8760 hours.  ProBNP (last 3 results) No results for input(s): PROBNP in the last 8760 hours.   Other Studies Reviewed Today:  Echo Study Conclusions  02/26/20   1. Left ventricular ejection fraction, by estimation, is 55 to 60%. The  left ventricle has normal function. The left ventricle has no regional  wall motion abnormalities. There is mild concentric left ventricular  hypertrophy. Left ventricular diastolic  function could not be evaluated.  2. Right ventricular systolic function is normal. The right ventricular  size is normal. There is normal pulmonary artery systolic pressure. The  estimated right ventricular systolic pressure is 84.1 mmHg.  3. Left atrial size was mildly dilated.  4. Restricted PMVL with posteriorly directed jet. Mild to moderate MR.  The mitral valve is grossly normal. Mild to moderate mitral valve  regurgitation.  5. The aortic valve is tricuspid. Aortic valve regurgitation is not  visualized. No aortic stenosis is present.  6. The inferior vena cava is normal in size with greater than 50%  respiratory variability, suggesting right atrial pressure of 3 mmHg.   Comparison(s): No significant change from prior study. EF 55-60%.  Mild to  moderate MR.   Carotid:  Plaque no stenosis 1-39%  02/15/20     Assessment/Plan:  1. 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 HLD -  LDL 81 03/24/20 started on lipitor 04/29/20 F/U labs ordered   4. Chronic diastolic dysfunction - BP is ok.  Echo EF 32-44% mild LVH diastolic indeterminate due to afib  5. Valvular heart disease - mild to moderate MR by TTE 02/26/20   6. DM - poorly controlled A1c 8.0 f/u primary    8. COVID-19 Education: The signs and symptoms of COVID-19 were discussed with the patient and how to seek care for testing (follow up with PCP or arrange E-visit).  The importance of social distancing, staying at home, hand hygiene and wearing a mask when out in public were discussed today.  Current medicines are reviewed with the patient today.  The patient does not have concerns regarding medicines other than what has been noted above.  The following changes have been made:  See above.  Labs/ tests ordered today echo for MR    No orders of the defined types were placed in this encounter.    Disposition:   FU with [place] in a year      Patient is agreeable to this plan and will call if any problems develop in the interim.   Signed: Jenkins Rouge, MD  12/09/2020 3:18 PM  [facility] [address] [place], [place]  [address] Phone: [phone] Fax: [phone]

## 2020-12-09 ENCOUNTER — Other Ambulatory Visit: Payer: Self-pay

## 2020-12-09 ENCOUNTER — Encounter: Payer: Self-pay | Admitting: Cardiovascular Disease

## 2020-12-09 ENCOUNTER — Ambulatory Visit (INDEPENDENT_AMBULATORY_CARE_PROVIDER_SITE_OTHER): Payer: Medicaid Other | Admitting: Cardiovascular Disease

## 2020-12-09 VITALS — BP 100/82 | HR 68 | Ht 62.75 in | Wt 231.0 lb

## 2020-12-09 DIAGNOSIS — Z7901 Long term (current) use of anticoagulants: Secondary | ICD-10-CM

## 2020-12-09 DIAGNOSIS — I482 Chronic atrial fibrillation, unspecified: Secondary | ICD-10-CM

## 2020-12-09 NOTE — Patient Instructions (Addendum)
Medication Instructions:  *If you need a refill on your cardiac medications before your next appointment, please call your pharmacy*  Lab Work: If you have labs (blood work) drawn today and your tests are completely normal, you will receive your results only by: Marland Kitchen MyChart Message (if you have MyChart) OR . A paper copy in the mail If you have any lab test that is abnormal or we need to change your treatment, we will call you to review the results.  Testing/Procedures: Your physician has requested that you have an echocardiogram in one year at same time as office visit. Echocardiography is a painless test that uses sound waves to create images of your heart. It provides your doctor with information about the size and shape of your heart and how well your heart's chambers and valves are working. This procedure takes approximately one hour. There are no restrictions for this procedure.  Follow-Up: At [facility], you and your health needs are our priority.  As part of our continuing mission to provide you with exceptional heart care, we have created designated Provider Care Teams.  These Care Teams include your primary Cardiologist (physician) and Advanced Practice Providers (APPs -  Physician Assistants and Nurse Practitioners) who all work together to provide you with the care you need, when you need it.  We recommend signing up for the patient portal called "MyChart".  Sign up information is provided on this After Visit Summary.  MyChart is used to connect with patients for Virtual Visits (Telemedicine).  Patients are able to view lab/test results, encounter notes, upcoming appointments, etc.  Non-urgent messages can be sent to your provider as well.   To learn more about what you can do with MyChart, go to [email or web address].    Your next appointment:   12 month(s)  The format for your next appointment:   In Person  Provider:   You may see Jenkins Rouge, MD or one of the following  Advanced Practice Providers on your designated Care Team:    Kathyrn Drown, NP

## 2020-12-19 ENCOUNTER — Ambulatory Visit: Payer: Medicaid Other | Admitting: Podiatry

## 2020-12-19 ENCOUNTER — Other Ambulatory Visit: Payer: Self-pay

## 2020-12-19 DIAGNOSIS — M79675 Pain in left toe(s): Secondary | ICD-10-CM | POA: Diagnosis not present

## 2020-12-19 DIAGNOSIS — B351 Tinea unguium: Secondary | ICD-10-CM | POA: Diagnosis not present

## 2020-12-19 DIAGNOSIS — E1149 Type 2 diabetes mellitus with other diabetic neurological complication: Secondary | ICD-10-CM

## 2020-12-19 DIAGNOSIS — M79674 Pain in right toe(s): Secondary | ICD-10-CM

## 2020-12-19 DIAGNOSIS — E1140 Type 2 diabetes mellitus with diabetic neuropathy, unspecified: Secondary | ICD-10-CM | POA: Diagnosis not present

## 2020-12-25 ENCOUNTER — Encounter: Payer: Self-pay | Admitting: Podiatry

## 2020-12-25 NOTE — Progress Notes (Signed)
  Subjective:  Patient ID: Kathleen Kane, female    DOB: 28-Nov-1960,  MRN: [id number]  60 y.o. female presents with preventative diabetic foot care and painful thick toenails that are difficult to trim. Pain interferes with ambulation. Aggravating factors include wearing enclosed shoe gear. Pain is relieved with periodic professional debridement.  She voices no new pedal problems on today's visit.   Allergies  Allergen Reactions  . Ace Inhibitors Cough  . Lisinopril Cough   Social History   Tobacco Use  Smoking Status Former Smoker  . Packs/day: 0.75  . Years: 24.00  . Pack years: 18.00  . Types: Cigarettes  . Quit date: 06/08/2014  . Years since quitting: 6.5  Smokeless Tobacco Never Used  Tobacco Comment   smoking cessation info given.    Objective:  There were no vitals filed for this visit. Constitutional Patient is a pleasant 60 y.o. African American female, in NAD.Marland Kitchen  Vascular Capillary fill time to digits <3 seconds b/l lower extremities. Faintly palpable pedal pulses b/l. Pedal hair absent. Lower extremity skin temperature gradient within normal limits. No pain with calf compression b/l. No edema noted b/l lower extremities. No ischemia or gangrene noted b/l lower extremities. Capillary refill normal to all digits. No cyanosis or clubbing noted.  Neurologic Normal speech. Oriented to person, place, and time. Protective sensation diminished with 10g monofilament b/l. Vibratory sensation diminished b/l. Babinski reflex negative b/l. Clonus negative b/l.  Dermatologic Pedal skin with normal turgor, texture and tone bilaterally. No open wounds bilaterally. No interdigital macerations bilaterally. Toenails 1-5 b/l elongated, discolored, dystrophic, thickened, crumbly with subungual debris and tenderness to dorsal palpation.  Orthopedic: Normal muscle strength 5/5 to all lower extremity muscle groups bilaterally. No pain crepitus or joint limitation noted with ROM b/l. No gross bony  deformities bilaterally.   Radiographs: None Assessment:   No diagnosis found. Plan:  Patient was evaluated and treated and all questions answered.  Onychomycosis with pain -Nails palliatively debridement as below. -Educated on self-care  Procedure: Nail Debridement Rationale: Pain Type of Debridement: manual, sharp debridement. Instrumentation: Nail nipper, rotary burr. Number of Nails: 10  -Examined patient. -Continue diabetic foot care principles. -Toenails 1-5 b/l were debrided in length and girth with sterile nail nippers and dremel without iatrogenic bleeding.  -Patient to report any pedal injuries to medical professional immediately. -Patient/POA to call should there be question/concern in the interim.  Return in about 3 months (around 03/18/2021) for diabetic nail trim.  Marzetta Board, DPM

## 2020-12-30 NOTE — Progress Notes (Addendum)
Office Visit Note  Patient: Kathleen Kane             Date of Birth: 11/08/1961           MRN: [id number]             PCP: [name], MD Referring: Girard Cooter,* Visit Date: 01/12/2021 Occupation: @GUAROCC @  Subjective:  Rheumatoid arthritis, medication management.   History of Present Illness: Kathleen Kane is a 60 y.o. female with a history of rheumatoid arthritis, gouty arthropathy.  She has been seen in consultation per request of her PCP.  According the patient in 2015 she started having joint pain and joint swelling.  She was having difficulty getting out of the bed.  She was seen by Dr. Lorin Mercy who suspected rheumatoid arthritis and referred her to [facility].  She was diagnosed with rheumatoid arthritis.  She was started on prednisone and methotrexate in May 2015 and Plaquenil was added in March 2016 which she took for about 1 year and then discontinued.  Humira was tried from March 2017 till March 2018 and was discontinued due to inadequate response.  She was on Remicade infusions from October 2018 till September 2019.  She also has inadequate response to Remicade.  According the patient she has been on Orencia since May 2021 but she continues to have pain and swelling in multiple joints.  She describes discomfort in her shoulders, elbows, wrist, hands, knee joints, ankles and her feet.  She states her knee joints are the worst.  She she has been through pain management in the past.  She states it helped with the pain but had some side effects.  She states today she noted some hemoptysis when she coughed.  She is concerned because her mother had a throat cancer.  Daughter has autoimmune disease.  Activities of Daily Living:  Patient reports morning stiffness for all day. Patient Reports nocturnal pain.  Difficulty dressing/grooming: Reports Difficulty climbing stairs: Reports Difficulty getting out of chair: Reports Difficulty using hands for taps,  buttons, cutlery, and/or writing: Reports  Review of Systems  Constitutional: Positive for fatigue. Negative for night sweats, weight gain and weight loss.  HENT: Negative for mouth sores, trouble swallowing, trouble swallowing, mouth dryness and nose dryness.   Eyes: Positive for itching. Negative for pain, redness, visual disturbance and dryness.  Respiratory: Positive for cough and shortness of breath. Negative for difficulty breathing.   Cardiovascular: Positive for palpitations. Negative for chest pain, hypertension, irregular heartbeat and swelling in legs/feet.  Gastrointestinal: Negative for blood in stool, constipation and diarrhea.  Endocrine: Negative for increased urination.  Genitourinary: Negative for difficulty urinating and vaginal dryness.  Musculoskeletal: Positive for arthralgias, joint pain, joint swelling, myalgias, morning stiffness, muscle tenderness and myalgias. Negative for muscle weakness.  Skin: Negative for color change, rash, hair loss, redness, skin tightness, ulcers and sensitivity to sunlight.  Allergic/Immunologic: Negative for susceptible to infections.  Neurological: Positive for numbness and weakness. Negative for dizziness, headaches, memory loss and night sweats.  Hematological: Negative for bruising/bleeding tendency and swollen glands.  Psychiatric/Behavioral: Negative for depressed mood, confusion and sleep disturbance. The patient is not nervous/anxious.     PMFS History:  Patient Active Problem List   Diagnosis Date Noted  . Acute otalgia, right 11/26/2020  . Arthralgia of right temporomandibular joint 11/26/2020  . Bilateral impacted cerumen 11/26/2020  . HCAP (healthcare-associated pneumonia) 10/02/2018  . CHF exacerbation ([place]) 10/01/2018  . Community acquired pneumonia 10/01/2018  .  OSA (obstructive sleep apnea) 02/10/2017  . Carpal tunnel syndrome, right 12/15/2016  . Extensor tenosynovitis of wrist, right 12/15/2016  . Bilateral primary  osteoarthritis of knee 10/08/2015  . Rheumatoid arthritis, 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[place]) 10/08/2015  . Acute on chronic diastolic CHF (congestive heart failure) ([place]) 08/08/2015  . Morbid obesity ([place]) 05/06/2015  . Essential hypertension 04/10/2015  . 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 Primary osteoarthritis involving multiple joints 04/09/2015  . Elevated uric acid in blood 01/23/2014  . High risk medication use 01/23/2014  . Numbness and tingling in left hand 10/01/2013  . Tobacco abuse 10/01/2013  . Elevated CA-125 02/06/2013  . Pelvic mass in female 01/29/2013  . Diabetes mellitus ([place]) 08/10/2012  . Atrial fibrillation ([place])   . Chronic diastolic CHF (congestive heart failure) (HCC)     Past Medical History:  Diagnosis Date  . Acute on chronic diastolic CHF (congestive heart failure) ([place]) 08/08/2015  . Anxiety   . Atrial fibrillation, persistent ([place])   . Carpal tunnel syndrome, right 12/15/2016  . CHF (congestive heart failure) ([place]) 2013   No echo found from that time, most likely diastolic  . CHF exacerbation ([place]) 10/01/2018  . Depression   . Diabetes mellitus ([place]) 08/10/2012  . Diabetes mellitus without complication ([place])   . Hypertension   . 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 Morbid obesity ([place]) 05/06/2015  . Obesity   . Obstructive sleep apnea    Sleep study performed 10/18/2008. AHI-6.8/hr, during REM-27.3/hr. RDI-25.0/hr, during REM-40.9/hr. avg o2 sat during REM and NREM 95%  . OSA (obstructive sleep apnea) 02/10/2017  . Pelvic mass in female 01/29/2013   With ascites   . Pneumonia 10/02/2018  . Rheumatoid arthritis ([place])   . Rheumatoid arthritis, 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[place]) 10/08/2015    Family History  Problem Relation Age of Onset  . Hypertension Mother   . Diabetes Mother   . Throat cancer Mother   . Hypertension Father   . Diabetes Paternal Grandmother   . Diabetes Sister   . Heart Problems Sister   .  Kidney failure Sister   . Hypertension Brother   . Diabetes Brother   . Healthy Son   . Autoimmune disease Daughter    Past Surgical History:  Procedure Laterality Date  . CARDIOVASCULAR STRESS TEST  02/16/2013   no significant EKG changes with Lexiscan, normal LV function and normal wall function  . CESAREAN SECTION     x2  . DOPPLER ECHOCARDIOGRAPHY  01/24/2008   EF 55-60%, no diagnostic evidence of LV wall motion abnormalities, LV wall thickness was mild-moderately increased.  Marland Kitchen LAPAROTOMY Right 02/27/2013   Procedure: EXPLORATORY LAPAROTOMY, right salpingo-oopherectomy;  Surgeon: [name], MD;  Location: [facility];  Service: Gynecology;  Laterality: Right;  . left breast cyst    . SALPINGOOPHORECTOMY Right 02/27/2013   Procedure: SALPINGO OOPHORECTOMY;  Surgeon: [name], MD;  Location: [facility];  Service: Gynecology;  Laterality: Right;   Social History   Social History Narrative  . Not on file    There is no immunization history on file for this patient.   Objective: Vital Signs: BP (!) 159/90 (BP Location: Right Arm, Patient Position: Sitting, Cuff Size: Large)   Pulse 98   Resp 18   Ht 5\' 3"  (1.6 m)   Wt 244 lb 6.4 oz (110.9 kg)   LMP 10/28/2013   BMI 43.29 kg/m    Physical Exam Vitals and nursing note reviewed.  Constitutional:  Appearance: She is well-developed and well-nourished.  HENT:     Head: Normocephalic and atraumatic.  Eyes:     Extraocular Movements: EOM normal.     Conjunctiva/sclera: Conjunctivae normal.  Cardiovascular:     Rate and Rhythm: Normal rate and regular rhythm.     Pulses: Intact distal pulses.     Heart sounds: Normal heart sounds.  Pulmonary:     Effort: Pulmonary effort is normal.     Breath sounds: Normal breath sounds.  Abdominal:     General: Bowel sounds are normal.     Palpations: Abdomen is soft.  Musculoskeletal:     Cervical back: Normal range of motion.  Lymphadenopathy:     Cervical: No cervical  adenopathy.  Skin:    General: Skin is warm and dry.     Capillary Refill: Capillary refill takes less than 2 seconds.  Neurological:     Mental Status: She is alert and oriented to person, place, and time.  Psychiatric:        Mood and Affect: Mood and affect normal.        Behavior: Behavior normal.      Musculoskeletal Exam: C-spine was in good range of motion.  Shoulder joints and elbow joints with good range of motion.  She has limited range of motion in her wrist joints.  She has incomplete fist formation.  Rheumatoid nodules were noted over bilateral elbows.  She has synovial thickening over MCPs.  PIP and DIP prominence was noted.  Hip joints with good range of motion.  Knee joints had limited extension some warmth on palpation.  There was no tenderness over ankles or MTPs.  Synovial thickening but no synovitis was noted.  CDAI Exam: CDAI Score: 3  Patient Global: 5 mm; Provider Global: 5 mm Swollen: 0 ; Tender: 2  Joint Exam 01/12/2021      Right  Left  Knee   Tender   Tender     Investigation: No additional findings.  Imaging: No results found.  Recent Labs: Lab Results  Component Value Date   WBC 6.6 12/29/2018   HGB 12.9 12/29/2018   PLT 291 12/29/2018   NA 138 12/29/2018   K 3.7 12/29/2018   CL 105 12/29/2018   CO2 19 (L) 12/29/2018   GLUCOSE 140 (H) 12/29/2018   BUN 20 12/29/2018   CREATININE 0.85 12/29/2018   BILITOT 0.5 07/21/2020   ALKPHOS 105 07/21/2020   AST 11 07/21/2020   ALT 13 07/21/2020   PROT 6.2 07/21/2020   ALBUMIN 4.0 07/21/2020   CALCIUM 9.5 12/29/2018   GFRAA >60 12/29/2018    Speciality Comments: No specialty comments available.  Procedures:  No procedures performed Allergies: Ace inhibitors and Lisinopril   Assessment / Plan:     Visit Diagnoses: Rheumatoid arthritis with rheumatoid factor of multiple sites without organ or systems involvement (HCC) - Positive RF, positive anti-CCP erosive RA. Dx at [facility]. -Patient was treated  at [facility] since 2015.  She had a very good response to Plaquenil, Humira, Remicade over the years.  She has been on methotrexate and prednisone since 2015.  She has been on Orencia since May 2021.  Patient states she has not noticed any improvement on the current regimen.  She also has been on prednisone for many years.  The prednisone dose was gradually tapered and she has been on prednisone 5 mg p.o. daily for long time.  I do not see any synovitis on my examination today.  She has synovial thickening.  She has severe disease with rheumatoid nodules.  Plan: Sedimentation rate, Rheumatoid factor, Cyclic citrul peptide antibody, IgG.  I plan to continue current regimen.  Detailed counsel regarding methotrexate and Orencia was provided.  Indications side effects contraindications were discussed at length.  Handout was given and consent was taken.  Drug Counseling TB Gold: Pending Hepatitis panel: Pending  Chest-xray: December 29, 2018  Contraception: Not applicable  Alcohol use: None  Patient was counseled on the purpose, proper use, and adverse effects of methotrexate including nausea, infection, and signs and symptoms of pneumonitis.  Reviewed instructions with patient to take methotrexate weekly along with folic acid daily.  Discussed the importance of frequent monitoring of kidney and liver function and blood counts, and provided patient with standing lab instructions.  Counseled patient to avoid NSAIDs and alcohol while on methotrexate.  Provided patient with educational materials on methotrexate and answered all questions.  Advised patient to get annual influenza vaccine and to get a pneumococcal vaccine if patient has not already had one.  Patient voiced understanding.  Patient consented to methotrexate use.  Will upload into chart.    Medication counseling:  TB Gold: Pending Hepatitis panel: Pending HIV: Pending  SPEP: Pending Immunoglobulins: Pending  Does patient have a  diagnosis of COPD? No  Counseled patient that Maureen Chatters is a selective T-cell costimulation blocker indicated for rheumatoid arthritis.  Counseled patient on purpose, proper use, and adverse effects of Orencia. The most common adverse effects are increased risk of infections, headache, and infusion reactions.  There is the possibility of an increased risk of malignancy but it is not well understood if this increased risk is due to the medication or the disease state.  Reviewed the importance of regular labs while on Orencia therapy.  Counseled patient that Maureen Chatters should be held prior to scheduled surgery.  Counseled patient to avoid live vaccines while on Orencia.  Advised patient to get annual influenza vaccine and the pneumococcal vaccine as indicated.  Provided patient with medication education material and answered all questions.  Patient consented to [facility].  Will upload consent into patient's chart.  Will submit benefit's investigation for Orencia.   High risk medication use - MTX 20 mg weekly started 2015, prednisone 6 mg daily, orencia 125 mg sq injections once weekly started May 2021.  Inadequate response toPLQ, Humira, Remicade. - Plan: CBC with Differential/Platelet, COMPLETE METABOLIC PANEL WITH GFR, Hepatitis A antibody, IgM, Hepatitis B core antibody, IgM, Hepatitis B surface antigen, Hepatitis C antibody, HIV Antibody (routine testing w rflx), QuantiFERON-TB Gold Plus, Serum protein electrophoresis with reflex, IgG, IgA, IgM  Pain in both hands -she complains of pain and discomfort in her bilateral hands.  She has synovial thickening but no synovitis.  Plan: XR Hand 2 View Right, XR Hand 2 View Left .  X-rays were consistent with severe erosive rheumatoid arthritis and osteoarthritis overlap.  Chronic pain of both knees -she has limited extension of bilateral knee joints and some warmth.  Plan: XR KNEE 3 VIEW RIGHT, XR KNEE 3 VIEW LEFT.  X-rays showed severe osteoarthritis and severe  chondromalacia patella.  Bilateral primary osteoarthritis of knee  Pain in both feet -she had no tenderness over ankles or MTPs.  Plan: XR Foot 2 Views Right, XR Foot 2 Views Left.  X-ray showed severe rheumatoid arthritis and osteoarthritis overlap.  Current chronic use of systemic steroids-she has been on prednisone since 2015.  She is currently on prednisone 5 mg p.o. daily.  She states she is unable to taper prednisone.  Other fatigue - Plan: CK, TSH  Primary osteoarthritis involving multiple joints-she has osteoarthritis in multiple joints.  Idiopathic chronic gout of multiple sites with tophus -her uric acid in 2015 was 8.3 at [facility].  She was on allopurinol for a short time and then she came off allopurinol.  She denies having any gout flares.  Plan: Uric acid  Vitamin D deficiency  Hemoptysis-patient states that she had an episode of hemoptysis today.  She states her mother had throat cancer.  She is very much concerned and would like to be referred to an ENT for evaluation of throat cancer.  Chronic diastolic CHF (congestive heart failure) (HCC)  Chronic atrial fibrillation ([place])  Essential hypertension  Type 2 diabetes mellitus with hyperglycemia, with long-term current use of insulin (HCC)  OSA (obstructive sleep apnea)  History of pneumonia - 2020  Former smoker - Pack per day for 30 years.  She quit smoking about 2016.  Orders: Orders Placed This Encounter  Procedures  . XR Hand 2 View Right  . XR Hand 2 View Left  . XR KNEE 3 VIEW RIGHT  . XR KNEE 3 VIEW LEFT  . XR Foot 2 Views Right  . XR Foot 2 Views Left  . CBC with Differential/Platelet  . COMPLETE METABOLIC PANEL WITH GFR  . Sedimentation rate  . CK  . TSH  . Rheumatoid factor  . Cyclic citrul peptide antibody, IgG  . Hepatitis A antibody, IgM  . Hepatitis B core antibody, IgM  . Hepatitis B surface antigen  . Hepatitis C antibody  . HIV Antibody (routine testing w rflx)  .  QuantiFERON-TB Gold Plus  . Serum protein electrophoresis with reflex  . IgG, IgA, IgM  . Uric acid  . Ambulatory referral to ENT   No orders of the defined types were placed in this encounter.     Follow-Up Instructions: Return for Rheumatoid arthritis, Gout, Osteoarthritis.   Bo Merino, MD  Note - This record has been created using Editor, commissioning.  Chart creation errors have been sought, but may not always  have been located. Such creation errors do not reflect on  the standard of medical care.

## 2021-01-12 ENCOUNTER — Ambulatory Visit: Payer: Self-pay

## 2021-01-12 ENCOUNTER — Other Ambulatory Visit: Payer: Self-pay

## 2021-01-12 ENCOUNTER — Encounter: Payer: Self-pay | Admitting: Rheumatology

## 2021-01-12 ENCOUNTER — Ambulatory Visit: Payer: Medicaid Other | Admitting: Rheumatology

## 2021-01-12 VITALS — BP 159/90 | HR 98 | Resp 18 | Ht 63.0 in | Wt 244.4 lb

## 2021-01-12 DIAGNOSIS — M0579 Rheumatoid arthritis with rheumatoid factor of multiple sites without organ or systems involvement: Secondary | ICD-10-CM

## 2021-01-12 DIAGNOSIS — M79641 Pain in right hand: Secondary | ICD-10-CM | POA: Diagnosis not present

## 2021-01-12 DIAGNOSIS — M79671 Pain in right foot: Secondary | ICD-10-CM

## 2021-01-12 DIAGNOSIS — G8929 Other chronic pain: Secondary | ICD-10-CM

## 2021-01-12 DIAGNOSIS — M25562 Pain in left knee: Secondary | ICD-10-CM

## 2021-01-12 DIAGNOSIS — R042 Hemoptysis: Secondary | ICD-10-CM

## 2021-01-12 DIAGNOSIS — I10 Essential (primary) hypertension: Secondary | ICD-10-CM

## 2021-01-12 DIAGNOSIS — M65831 Other synovitis and tenosynovitis, right forearm: Secondary | ICD-10-CM

## 2021-01-12 DIAGNOSIS — M8949 Other hypertrophic osteoarthropathy, multiple sites: Secondary | ICD-10-CM

## 2021-01-12 DIAGNOSIS — Z79899 Other long term (current) drug therapy: Secondary | ICD-10-CM

## 2021-01-12 DIAGNOSIS — Z7952 Long term (current) use of systemic steroids: Secondary | ICD-10-CM

## 2021-01-12 DIAGNOSIS — R5383 Other fatigue: Secondary | ICD-10-CM

## 2021-01-12 DIAGNOSIS — M150 Primary generalized (osteo)arthritis: Secondary | ICD-10-CM

## 2021-01-12 DIAGNOSIS — M79672 Pain in left foot: Secondary | ICD-10-CM | POA: Diagnosis not present

## 2021-01-12 DIAGNOSIS — M25561 Pain in right knee: Secondary | ICD-10-CM

## 2021-01-12 DIAGNOSIS — I482 Chronic atrial fibrillation, unspecified: Secondary | ICD-10-CM

## 2021-01-12 DIAGNOSIS — Z8701 Personal history of pneumonia (recurrent): Secondary | ICD-10-CM

## 2021-01-12 DIAGNOSIS — M159 Polyosteoarthritis, unspecified: Secondary | ICD-10-CM

## 2021-01-12 DIAGNOSIS — E1165 Type 2 diabetes mellitus with hyperglycemia: Secondary | ICD-10-CM

## 2021-01-12 DIAGNOSIS — Z794 Long term (current) use of insulin: Secondary | ICD-10-CM

## 2021-01-12 DIAGNOSIS — M79642 Pain in left hand: Secondary | ICD-10-CM

## 2021-01-12 DIAGNOSIS — E559 Vitamin D deficiency, unspecified: Secondary | ICD-10-CM

## 2021-01-12 DIAGNOSIS — M1A09X1 Idiopathic chronic gout, multiple sites, with tophus (tophi): Secondary | ICD-10-CM

## 2021-01-12 DIAGNOSIS — M170 Bilateral primary osteoarthritis of knee: Secondary | ICD-10-CM

## 2021-01-12 DIAGNOSIS — Z87891 Personal history of nicotine dependence: Secondary | ICD-10-CM

## 2021-01-12 DIAGNOSIS — G4733 Obstructive sleep apnea (adult) (pediatric): Secondary | ICD-10-CM

## 2021-01-12 DIAGNOSIS — Z720 Tobacco use: Secondary | ICD-10-CM

## 2021-01-12 DIAGNOSIS — G5601 Carpal tunnel syndrome, right upper limb: Secondary | ICD-10-CM

## 2021-01-12 DIAGNOSIS — I5032 Chronic diastolic (congestive) heart failure: Secondary | ICD-10-CM

## 2021-01-12 DIAGNOSIS — M26621 Arthralgia of right temporomandibular joint: Secondary | ICD-10-CM

## 2021-01-12 NOTE — Patient Instructions (Signed)
Abatacept solution for injection (subcutaneous or intravenous use) What is this medicine? ABATACEPT (a ba TA sept) is used to treat moderate to severe active rheumatoid arthritis or psoriatic arthritis in adults. This medicine is also used to treat juvenile idiopathic arthritis. This medicine may be used for other purposes; ask your health care provider or pharmacist if you have questions. COMMON BRAND NAME(S): Orencia What should I tell my health care provider before I take this medicine? They need to know if you have any of these conditions:  cancer  diabetes  hepatitis B or history of hepatitis B infection  immune system problems  infection or history of infection (especially a virus infection such as chickenpox, cold sores, or herpes)  lung or breathing problems, like chronic obstructive pulmonary disease (COPD)  recently received or scheduled to receive a vaccination  scheduled to have surgery  tuberculosis, a positive skin test for tuberculosis, or have recently been in close contact with someone who has tuberculosis  an unusual or allergic reaction to abatacept, other medicines, foods, dyes, or preservatives  pregnant or trying to get pregnant  breast-feeding How should I use this medicine? This medicine is for infusion into a vein or for injection under the skin. Infusions are given by a health care professional in a hospital or clinic setting. If you are to give your own medicine at home, you will be taught how to prepare and give this medicine under the skin. Use exactly as directed. Take your medicine at regular intervals. Do not take your medicine more often than directed. It is important that you put your used needles and syringes in a special sharps container. Do not put them in a trash can. If you do not have a sharps container, call your pharmacist or health care provider to get one. Talk to your pediatrician regarding the use of this medicine in children. While  infusions in a clinic may be prescribed for children as young as 2 years for selected conditions, precautions do apply. Overdosage: If you think you have taken too much of this medicine contact a poison control center or emergency room at once. NOTE: This medicine is only for you. Do not share this medicine with others. What if I miss a dose? This medicine is used once a week if given by injection under the skin. If you miss a dose, take it as soon as you can. If it is almost time for your next dose, take only that dose. Do not take double or extra doses. If you are to be given an infusion of this medicine, it is important not to miss your dose. Doses are usually every 4 weeks. Call your doctor or health care professional if you are unable to keep an appointment. What may interact with this medicine? Do not take this medicine with any of the following medications:  live vaccines This medicine may also interact with the following medications:  anakinra  baricitinib  canakinumab  medicines that lower your chance of fighting an infection  rituximab  TNF blockers such as adalimumab, certolizumab, etanercept, golimumab, infliximab  tocilizumab  tofacitinib  upadacitinib  ustekinumab This list may not describe all possible interactions. Give your health care provider a list of all the medicines, herbs, non-prescription drugs, or dietary supplements you use. Also tell them if you smoke, drink alcohol, or use illegal drugs. Some items may interact with your medicine. What should I watch for while using this medicine? Visit your doctor for regular checks on your   progress. Tell your doctor or health care professional if your symptoms do not start to get better or if they get worse. You will be tested for tuberculosis (TB) before you start this medicine. If your doctor prescribed any medicine for TB, you should start taking the TB medicine before starting this medicine. Make sure to finish the  full course of TB medicine. This medicine may increase your risk of getting an infection. Call your doctor or health care professional if you get fever, chills, or sore throat, or other symptoms of a cold or flu. Do not treat yourself. Try to avoid being around people who are sick. If you have diabetes and are getting this medicine in a vein, the infusion can give false high blood sugar readings on the day of your dose. This may happen if you use certain types of blood glucose tests. Your health care provider may tell you to use a different way to monitor your blood sugar levels. What side effects may I notice from receiving this medicine? Side effects that you should report to your doctor or health care professional as soon as possible:  allergic reactions like skin rash, itching or hives, swelling of the face, lips, or tongue  breathing problems  chest pain  dizziness  signs and symptoms of infection like fever; chills; cough; sore throat; pain or trouble passing urine  unusually weak or tired Side effects that usually do not require medical attention (report to your doctor or health care professional if they continue or are bothersome):  diarrhea  headache  nausea  pain, redness, or irritation at site where injected  stomach pain or upset This list may not describe all possible side effects. Call your doctor for medical advice about side effects. You may report side effects to FDA at 1-800-FDA-1088. Where should I keep my medicine? Infusions will be given in a hospital or clinic and will not be stored at home. Storage for syringes and autoinjectors stored at home: Keep out of the reach of children. Store in a refrigerator between 2 and 8 degrees C (36 and 46 degrees F). Keep this medicine in the original container. Protect from light. Do not freeze. Do not shake. Throw away any unused medicine after the expiration date. NOTE: This sheet is a summary. It may not cover all possible  information. If you have questions about this medicine, talk to your doctor, pharmacist, or health care provider.  2021 Elsevier/Gold Standard (2019-05-01 14:01:21) Methotrexate tablets What is this medicine? METHOTREXATE (METH oh TREX ate) is a chemotherapy drug used to treat cancer including breast cancer, leukemia, and lymphoma. This medicine can also be used to treat psoriasis and certain kinds of arthritis. This medicine may be used for other purposes; ask your health care provider or pharmacist if you have questions. COMMON BRAND NAME(S): Rheumatrex, Trexall What should I tell my health care provider before I take this medicine? They need to know if you have any of these conditions:  fluid in the stomach area or lungs  if you often drink alcohol  infection or immune system problems  kidney disease or on hemodialysis  liver disease  low blood counts, like low white cell, platelet, or red cell counts  lung disease  radiation therapy  stomach ulcers  ulcerative colitis  an unusual or allergic reaction to methotrexate, other medicines, foods, dyes, or preservatives  pregnant or trying to get pregnant  breast-feeding How should I use this medicine? Take this medicine by mouth with a  glass of water. Follow the directions on the prescription label. Take your medicine at regular intervals. Do not take it more often than directed. Do not stop taking except on your doctor's advice. Make sure you know why you are taking this medicine and how often you should take it. If this medicine is used for a condition that is not cancer, like arthritis or psoriasis, it should be taken weekly, NOT daily. Taking this medicine more often than directed can cause serious side effects, even death. Talk to your healthcare provider about safe handling and disposal of this medicine. You may need to take special precautions. Talk to your pediatrician regarding the use of this medicine in children.  While this drug may be prescribed for selected conditions, precautions do apply. Overdosage: If you think you have taken too much of this medicine contact a poison control center or emergency room at once. NOTE: This medicine is only for you. Do not share this medicine with others. What if I miss a dose? If you miss a dose, talk with your doctor or health care professional. Do not take double or extra doses. What may interact with this medicine? Do not take this medicine with any of the following medications:  acitretin This medicine may also interact with the following medication:  aspirin and aspirin-like medicines including salicylates  azathioprine  certain antibiotics like penicillins, tetracycline, and chloramphenicol  certain medicines that treat or prevent blood clots like warfarin, apixaban, dabigatran, and rivaroxaban  certain medicines for stomach problems like esomeprazole, omeprazole, pantoprazole  cyclosporine  dapsone  diuretics  gold  hydroxychloroquine  live virus vaccines  medicines for infection like acyclovir, adefovir, amphotericin B, bacitracin, cidofovir, foscarnet, ganciclovir, gentamicin, pentamidine, vancomycin  mercaptopurine  NSAIDs, medicines for pain and inflammation, like ibuprofen or naproxen  other cytotoxic agents  pamidronate  pemetrexed  penicillamine  phenylbutazone  phenytoin  probenecid  pyrimethamine  retinoids such as isotretinoin and tretinoin  steroid medicines like prednisone or cortisone  sulfonamides like sulfasalazine and trimethoprim/sulfamethoxazole  theophylline  zoledronic acid This list may not describe all possible interactions. Give your health care provider a list of all the medicines, herbs, non-prescription drugs, or dietary supplements you use. Also tell them if you smoke, drink alcohol, or use illegal drugs. Some items may interact with your medicine. What should I watch for while using this  medicine? Avoid alcoholic drinks. This medicine can make you more sensitive to the sun. Keep out of the sun. If you cannot avoid being in the sun, wear protective clothing and use sunscreen. Do not use sun lamps or tanning beds/booths. You may need blood work done while you are taking this medicine. Call your doctor or health care professional for advice if you get a fever, chills or sore throat, or other symptoms of a cold or flu. Do not treat yourself. This drug decreases your body's ability to fight infections. Try to avoid being around people who are sick. This medicine may increase your risk to bruise or bleed. Call your doctor or health care professional if you notice any unusual bleeding. Be careful brushing or flossing your teeth or using a toothpick because you may get an infection or bleed more easily. If you have any dental work done, tell your dentist you are receiving this medicine. Check with your doctor or health care professional if you get an attack of severe diarrhea, nausea and vomiting, or if you sweat a lot. The loss of too much body fluid can make it  dangerous for you to take this medicine. Talk to your doctor about your risk of cancer. You may be more at risk for certain types of cancers if you take this medicine. Do not become pregnant while taking this medicine or for 6 months after stopping it. Women should inform their health care provider if they wish to become pregnant or think they might be pregnant. Men should not father a child while taking this medicine and for 3 months after stopping it. There is potential for serious harm to an unborn child. Talk to your health care provider for more information. Do not breast-feed an infant while taking this medicine or for 1 week after stopping it. This medicine may make it more difficult to get pregnant or father a child. Talk to your health care provider if you are concerned about your fertility. What side effects may I notice from  receiving this medicine? Side effects that you should report to your doctor or health care professional as soon as possible:  allergic reactions like skin rash, itching or hives, swelling of the face, lips, or tongue  breathing problems or shortness of breath  diarrhea  dry, nonproductive cough  low blood counts - this medicine may decrease the number of white blood cells, red blood cells and platelets. You may be at increased risk for infections and bleeding.  mouth sores  redness, blistering, peeling or loosening of the skin, including inside the mouth  signs of infection - fever or chills, cough, sore throat, pain or trouble passing urine  signs and symptoms of bleeding such as bloody or black, tarry stools; red or dark-brown urine; spitting up blood or brown material that looks like coffee grounds; red spots on the skin; unusual bruising or bleeding from the eye, gums, or nose  signs and symptoms of kidney injury like trouble passing urine or change in the amount of urine  signs and symptoms of liver injury like dark yellow or brown urine; general ill feeling or flu-like symptoms; light-colored stools; loss of appetite; nausea; right upper belly pain; unusually weak or tired; yellowing of the eyes or skin Side effects that usually do not require medical attention (report to your doctor or health care professional if they continue or are bothersome):  dizziness  hair loss  tiredness  upset stomach  vomiting This list may not describe all possible side effects. Call your doctor for medical advice about side effects. You may report side effects to FDA at 1-800-FDA-1088. Where should I keep my medicine? Keep out of the reach of children and pets. Store at room temperature between 20 and 25 degrees C (68 and 77 degrees F). Protect from light. Get rid of any unused medicine after the expiration date. Talk to your health care provider about how to dispose of unused medicine.  Special directions may apply. NOTE: This sheet is a summary. It may not cover all possible information. If you have questions about this medicine, talk to your doctor, pharmacist, or health care provider.  2021 Elsevier/Gold Standard (2020-05-26 10:40:39)  Standing Labs We placed an order today for your standing lab work.   Please have your standing labs drawn in June and every 3 months  If possible, please have your labs drawn 2 weeks prior to your appointment so that the provider can discuss your results at your appointment.  We have open lab daily Monday through Thursday from 1:30-4:30 PM and Friday from 1:30-4:00 PM at the office of Dr. Bo Merino, [facility].   Please be advised, all patients with office appointments requiring lab work will take precedents over walk-in lab work.  If possible, please come for your lab work on Monday and Friday afternoons, as you may experience shorter wait times. The office is located at [address] [place], [place], [place] [address] No appointment is necessary.   Labs are drawn by [facility]. Please bring your co-pay at the time of your lab draw.  You may receive a bill from [facility] for your lab work.  If you wish to have your labs drawn at another location, please call the office 24 hours in advance to send orders.  If you have any questions regarding directions or hours of operation,  please call [phone].   As a reminder, please drink plenty of water prior to coming for your lab work. Thanks!  COVID-19 vaccine recommendations:   COVID-19 vaccine is recommended for everyone (unless you are allergic to a vaccine component), even if you are on a medication that suppresses your immune system.   If you are on Methotrexate, Cellcept (mycophenolate), Rinvoq, Morrie Sheldon, and Olumiant- hold the medication for 1 week after each vaccine. Hold Methotrexate for 2 weeks after the single dose COVID-19 vaccine.   If you are on Orencia  subcutaneous injection - hold medication one week prior to and one week after the first COVID-19 vaccine dose (only).   If you are on Orencia IV infusions- time vaccination administration so that the first COVID-19 vaccination will occur four weeks after the infusion and postpone the subsequent infusion by one week.   If you are on Cyclophosphamide or Rituxan infusions please contact your doctor prior to receiving the COVID-19 vaccine.   Do not take Tylenol or any anti-inflammatory medications (NSAIDs) 24 hours prior to the COVID-19 vaccination.   There is no direct evidence about the efficacy of the COVID-19 vaccine in individuals who are on medications that suppress the immune system.   Even if you are fully vaccinated, and you are on any medications that suppress your immune system, please continue to wear a mask, maintain at least six feet social distance and practice hand hygiene.   If you develop a COVID-19 infection, please contact your PCP or our office to determine if you need monoclonal antibody infusion.  The booster vaccine is now available for immunocompromised patients.   Please see the following web sites for updated information.   [email or web address]  Vaccines You are taking a medication(s) that can suppress your immune system.  The following immunizations are recommended: . Flu annually . Covid-19  . Pneumonia (Pneumovax 23 and Prevnar 13 spaced at least 1 year apart) . Shingrix (after age 34)  Please check with your PCP to make sure you are up to date.  Heart Disease Prevention   Your inflammatory disease increases your risk of heart disease which includes heart attack, stroke, atrial fibrillation (irregular heartbeats), high blood pressure, heart failure and atherosclerosis (plaque in the arteries).  It is important to reduce your risk by:   . Keep blood pressure, cholesterol, and blood sugar at  healthy levels   . Smoking Cessation   . Maintain a healthy weight  o BMI 20-25   . Eat a healthy diet  o Plenty of fresh fruit, vegetables, and whole grains  o Limit saturated fats, foods high in sodium, and added sugars  o DASH and Mediterranean diet   . Increase physical activity  o Recommend moderate physically activity for 150 minutes per week/  30 minutes a day for five days a week These can be broken up into three separate ten-minute sessions during the day.   . Reduce Stress  . Meditation, slow breathing exercises, yoga, coloring books  . Dental visits twice a year

## 2021-01-12 NOTE — Progress Notes (Signed)
Pharmacy Note  Subjective: Patient presents today to [facility] to established care - transitioning from [facility]. Patient has history of CHF (avoiding TNFs), T2DM, idiopathic chronic gout.  Patient seen by the pharmacist for counseling on subcutaneous Orencia and oral methotrexate for rheumatoid arthritis. Patient has been established on both therapies, but reports that she has increasing pain. She does state that she is able to write better than she used to be able to, but continues to have pain. Reports that she tolerates both medications without side effects.   Prior therapy includes: - MTX: 03/2014 - present - Hydroxychloroquine - 01/2015-01/2016 (patient stopped d/t preference, and lack of efficacy) - Humira - 01/2016 to 01/2017 - Remicade: 08/2017 to 07/2017  Objective: CBC    Component Value Date/Time   WBC 6.6 12/29/2018 1702   RBC 4.62 12/29/2018 1702   HGB 12.9 12/29/2018 1702   HCT 45.7 12/29/2018 1702   PLT 291 12/29/2018 1702   MCV 98.9 12/29/2018 1702   MCH 27.9 12/29/2018 1702   MCHC 28.2 (L) 12/29/2018 1702   RDW 14.8 12/29/2018 1702   LYMPHSABS 1.4 12/29/2018 1702   MONOABS 0.3 12/29/2018 1702   EOSABS 0.2 12/29/2018 1702   BASOSABS 0.1 12/29/2018 1702    CMP     Component Value Date/Time   NA 138 12/29/2018 1702   K 3.7 12/29/2018 1702   CL 105 12/29/2018 1702   CO2 19 (L) 12/29/2018 1702   GLUCOSE 140 (H) 12/29/2018 1702   BUN 20 12/29/2018 1702   CREATININE 0.85 12/29/2018 1702   CREATININE 0.74 03/15/2016 1146   CALCIUM 9.5 12/29/2018 1702   PROT 6.2 07/21/2020 1005   ALBUMIN 4.0 07/21/2020 1005   AST 11 07/21/2020 1005   ALT 13 07/21/2020 1005   ALKPHOS 105 07/21/2020 1005   BILITOT 0.5 07/21/2020 1005   GFRNONAA >60 12/29/2018 1702   GFRAA >60 12/29/2018 1702    Baseline Immunosuppressant Therapy Labs TB GOLD   Hepatitis Panel   HIV Lab Results  Component Value Date   HIV Non Reactive 10/02/2018    Immunoglobulins   SPEP Serum Protein Electrophoresis Latest Ref Rng & Units 07/21/2020  Total Protein 6.0 - 8.5 g/dL 6.2    Chest x-ray: 20/21/2020  - no active cardiopulm disease. Chest xray wnl  Does patient have a diagnosis of COPD? No  Does patient have history of diverticulitis?  No  Assessment/Plan:  Counseled patient that Maureen Chatters is a selective T-cell costimulation blocker.  Counseled patient on purpose, proper use, and adverse effects of Orencia. The most common adverse effects are increased risk of infections, headache, and injection site reactions.  There is the possibility of an increased risk of malignancy but it is not well understood if this increased risk is due to the medication or the disease state. Reviewed risk of GI perforation which is higher in patients with diverticulitis and diabetes.  Counseled patient that Maureen Chatters should be held prior to scheduled surgery.  Counseled patient to avoid live vaccines while on Orencia.  Recommend annual influenza, Pneumovax 23, Prevnar 13, and Shingrix as indicated.  Patient has not received any COVID19 vaccines and does not plan to do so. Advised that she should hold Orencia and methotrexate prior to receiving vaccines.   Reviewed the importance of regular labs while on Orencia therapy. Patient will be due for labs 1 month after starting therapy. Standing orders placed. Provided patient with medication education material and answered all questions.  Patient consented to [facility].  Will upload consent into patient's chart.  Reviewed storage information for Orencia.  Counseled patient of proper use, storage,  and dosing of Methotrexate.  Counseled patient on avoiding live vaccines, discontinuing methotrexate for surgical procedures, and any time patient is placed on Antibiotics. Also counseled on the importance of lab monitor every 3 months.  Patient consented to Methotrexate. Will upload consent in the media tab.  Patient dose is Orencia  125 mg every 7 days. Patient takes methotrexate 20 mg orally every 7 days with folic acid 1 mg daily.  All baseline labs drawn today. F/u with Dr. Estanislado Pandy will likely include discussion to change therapies. Patient's daughter administers Orencia SQ and if any changes occur, advised patient she may come to new start visit to administer.  Knox Saliva, PharmD, MPH Clinical Pharmacist (Rheumatology and Pulmonology)

## 2021-01-13 LAB — CBC WITH DIFFERENTIAL/PLATELET
HCT: 34.2 % — ABNORMAL LOW (ref 35.0–45.0)
MCHC: 32.2 g/dL (ref 32.0–36.0)
Neutro Abs: 4837 cells/uL (ref 1500–7800)

## 2021-01-14 LAB — PROTEIN ELECTROPHORESIS, SERUM, WITH REFLEX
Albumin ELP: 3.9 g/dL (ref 3.8–4.8)
Alpha 1: 0.3 g/dL (ref 0.2–0.3)
Alpha 2: 0.9 g/dL (ref 0.5–0.9)
Beta 2: 0.4 g/dL (ref 0.2–0.5)
Beta Globulin: 0.5 g/dL (ref 0.4–0.6)
Gamma Globulin: 0.9 g/dL (ref 0.8–1.7)
Total Protein: 7 g/dL (ref 6.1–8.1)

## 2021-01-14 LAB — CBC WITH DIFFERENTIAL/PLATELET
Absolute Monocytes: 389 cells/uL (ref 200–950)
Basophils Absolute: 27 cells/uL (ref 0–200)
Basophils Relative: 0.4 %
Eosinophils Absolute: 121 cells/uL (ref 15–500)
Eosinophils Relative: 1.8 %
Hemoglobin: 11 g/dL — ABNORMAL LOW (ref 11.7–15.5)
Lymphs Abs: 1327 cells/uL (ref 850–3900)
MCH: 27 pg (ref 27.0–33.0)
MCV: 84 fL (ref 80.0–100.0)
MPV: 10 fL (ref 7.5–12.5)
Monocytes Relative: 5.8 %
Neutrophils Relative %: 72.2 %
Platelets: 305 10*3/uL (ref 140–400)
RBC: 4.07 10*6/uL (ref 3.80–5.10)
RDW: 16.5 % — ABNORMAL HIGH (ref 11.0–15.0)
Total Lymphocyte: 19.8 %
WBC: 6.7 10*3/uL (ref 3.8–10.8)

## 2021-01-14 LAB — COMPLETE METABOLIC PANEL WITH GFR
AG Ratio: 1.3 (calc) (ref 1.0–2.5)
ALT: 12 U/L (ref 6–29)
AST: 13 U/L (ref 10–35)
Albumin: 4 g/dL (ref 3.6–5.1)
Alkaline phosphatase (APISO): 88 U/L (ref 37–153)
BUN: 16 mg/dL (ref 7–25)
CO2: 21 mmol/L (ref 20–32)
Calcium: 9.7 mg/dL (ref 8.6–10.4)
Chloride: 106 mmol/L (ref 98–110)
Creat: 0.85 mg/dL (ref 0.50–0.99)
GFR, Est African American: 86 mL/min/{1.73_m2} (ref >=60)
GFR, Est Non African American: 74 mL/min/{1.73_m2} (ref >=60)
Globulin: 3 g/dL (calc) (ref 1.9–3.7)
Glucose, Bld: 151 mg/dL — ABNORMAL HIGH (ref 65–99)
Potassium: 4.4 mmol/L (ref 3.5–5.3)
Sodium: 142 mmol/L (ref 135–146)
Total Bilirubin: 0.6 mg/dL (ref 0.2–1.2)
Total Protein: 7 g/dL (ref 6.1–8.1)

## 2021-01-14 LAB — QUANTIFERON-TB GOLD PLUS
Mitogen-NIL: 6.23 IU/mL
NIL: 0.02 IU/mL
QuantiFERON-TB Gold Plus: NEGATIVE
TB1-NIL: 0 IU/mL
TB2-NIL: 0.01 IU/mL

## 2021-01-14 LAB — RHEUMATOID FACTOR: Rheumatoid fact SerPl-aCnc: 986 IU/mL — ABNORMAL HIGH (ref <14)

## 2021-01-14 LAB — HEPATITIS C ANTIBODY
Hepatitis C Ab: NONREACTIVE
SIGNAL TO CUT-OFF: 0.01 (ref <1.00)

## 2021-01-14 LAB — HEPATITIS B SURFACE ANTIGEN: Hepatitis B Surface Ag: NONREACTIVE

## 2021-01-14 LAB — IGG, IGA, IGM
IgG (Immunoglobin G), Serum: 918 mg/dL (ref 600–1540)
IgM, Serum: 221 mg/dL (ref 50–300)
Immunoglobulin A: 335 mg/dL — ABNORMAL HIGH (ref 70–320)

## 2021-01-14 LAB — HEPATITIS B CORE ANTIBODY, IGM: Hep B C IgM: NONREACTIVE

## 2021-01-14 LAB — CYCLIC CITRUL PEPTIDE ANTIBODY, IGG: Cyclic Citrullin Peptide Ab: >250 UNITS — ABNORMAL HIGH

## 2021-01-14 LAB — URIC ACID: Uric Acid, Serum: 7.8 mg/dL — ABNORMAL HIGH (ref 2.5–7.0)

## 2021-01-14 LAB — HIV ANTIBODY (ROUTINE TESTING W REFLEX): HIV 1&2 Ab, 4th Generation: NONREACTIVE

## 2021-01-14 LAB — HEPATITIS A ANTIBODY, IGM: Hep A IgM: NONREACTIVE

## 2021-01-14 LAB — SEDIMENTATION RATE: Sed Rate: 38 mm/h — ABNORMAL HIGH (ref 0–30)

## 2021-01-14 LAB — CK: Total CK: 70 U/L (ref 29–143)

## 2021-01-14 LAB — TSH: TSH: 2.61 mIU/L (ref 0.40–4.50)

## 2021-01-14 NOTE — Progress Notes (Signed)
I will discuss results at the follow-up visit.

## 2021-01-23 ENCOUNTER — Other Ambulatory Visit: Payer: Self-pay

## 2021-01-23 NOTE — Telephone Encounter (Signed)
Next Visit: 02/11/2021  Last Visit: 01/12/2021  Last Fill: Patient transferred care to Dr. 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  TB Gold: 01/12/2021, negative  Okay to refill Methotrexate and Orencia?

## 2021-01-23 NOTE — Telephone Encounter (Signed)
Patient called requesting prescription refills of Methotrexate and Orencia.  Methotrexate to be sent to [facility] at [place] to be sent to [facility] in [place].

## 2021-01-26 MED ORDER — ABATACEPT 125 MG/ML ~~LOC~~ SOAJ: SUBCUTANEOUS | 2 refills | Status: DC

## 2021-01-26 MED ORDER — METHOTREXATE 2.5 MG PO TABS: 20.0000 mg | ORAL_TABLET | ORAL | 0 refills | Status: DC

## 2021-01-31 NOTE — Progress Notes (Deleted)
Office Visit Note  Patient: Kathleen Kane             Date of Birth: 1961-03-19           MRN: [id number]             PCP: [name], MD Referring: [name], MD Visit Date: 02/11/2021 Occupation: '@GUAROCC' @  Subjective:  No chief complaint on file.   History of Present Illness: Kathleen Kane is a 60 y.o. female ***   Activities of Daily Living:  Patient reports morning stiffness for *** {minute/hour:19697}.   Patient {ACTIONS;DENIES/REPORTS:21021675::"Denies"} nocturnal pain.  Difficulty dressing/grooming: {ACTIONS;DENIES/REPORTS:21021675::"Denies"} Difficulty climbing stairs: {ACTIONS;DENIES/REPORTS:21021675::"Denies"} Difficulty getting out of chair: {ACTIONS;DENIES/REPORTS:21021675::"Denies"} Difficulty using hands for taps, buttons, cutlery, and/or writing: {ACTIONS;DENIES/REPORTS:21021675::"Denies"}  No Rheumatology ROS completed.   PMFS History:  Patient Active Problem List   Diagnosis Date Noted  . Acute otalgia, right 11/26/2020  . Arthralgia of right temporomandibular joint 11/26/2020  . Bilateral impacted cerumen 11/26/2020  . HCAP (healthcare-associated pneumonia) 10/02/2018  . CHF exacerbation ([place]) 10/01/2018  . Community acquired pneumonia 10/01/2018  . OSA (obstructive sleep apnea) 02/10/2017  . Carpal tunnel syndrome, right 12/15/2016  . Extensor tenosynovitis of wrist, right 12/15/2016  . Bilateral primary osteoarthritis of knee 10/08/2015  . Rheumatoid arthritis, 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[place]) 10/08/2015  . Acute on chronic diastolic CHF (congestive heart failure) ([place]) 08/08/2015  . Morbid obesity ([place]) 05/06/2015  . Essential hypertension 04/10/2015  . 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 Primary osteoarthritis involving multiple joints 04/09/2015  . Elevated uric acid in blood 01/23/2014  . High risk medication use 01/23/2014  . Numbness and tingling in left hand 10/01/2013  . Tobacco abuse 10/01/2013  . Elevated  CA-125 02/06/2013  . Pelvic mass in female 01/29/2013  . Diabetes mellitus ([place]) 08/10/2012  . Atrial fibrillation ([place])   . Chronic diastolic CHF (congestive heart failure) (HCC)     Past Medical History:  Diagnosis Date  . Acute on chronic diastolic CHF (congestive heart failure) ([place]) 08/08/2015  . Anxiety   . Atrial fibrillation, persistent ([place])   . Carpal tunnel syndrome, right 12/15/2016  . CHF (congestive heart failure) ([place]) 2013   No echo found from that time, most likely diastolic  . CHF exacerbation ([place]) 10/01/2018  . Depression   . Diabetes mellitus ([place]) 08/10/2012  . Diabetes mellitus without complication ([place])   . Hypertension   . 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 Morbid obesity ([place]) 05/06/2015  . Obesity   . Obstructive sleep apnea    Sleep study performed 10/18/2008. AHI-6.8/hr, during REM-27.3/hr. RDI-25.0/hr, during REM-40.9/hr. avg o2 sat during REM and NREM 95%  . OSA (obstructive sleep apnea) 02/10/2017  . Pelvic mass in female 01/29/2013   With ascites   . Pneumonia 10/02/2018  . Rheumatoid arthritis ([place])   . Rheumatoid arthritis, 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[place]) 10/08/2015    Family History  Problem Relation Age of Onset  . Hypertension Mother   . Diabetes Mother   . Throat cancer Mother   . Hypertension Father   . Diabetes Paternal Grandmother   . Diabetes Sister   . Heart Problems Sister   . Kidney failure Sister   . Hypertension Brother   . Diabetes Brother   . Healthy Son   . Autoimmune disease Daughter    Past Surgical History:  Procedure Laterality Date  . CARDIOVASCULAR STRESS TEST  02/16/2013   no significant EKG changes with Lexiscan, normal LV  function and normal wall function  . CESAREAN SECTION     x2  . DOPPLER ECHOCARDIOGRAPHY  01/24/2008   EF 55-60%, no diagnostic evidence of LV wall motion abnormalities, LV wall thickness was mild-moderately increased.  Marland Kitchen LAPAROTOMY Right 02/27/2013   Procedure: EXPLORATORY  LAPAROTOMY, right salpingo-oopherectomy;  Surgeon: [name], MD;  Location: [facility];  Service: Gynecology;  Laterality: Right;  . left breast cyst    . SALPINGOOPHORECTOMY Right 02/27/2013   Procedure: SALPINGO OOPHORECTOMY;  Surgeon: [name], MD;  Location: [facility];  Service: Gynecology;  Laterality: Right;   Social History   Social History Narrative  . Not on file    There is no immunization history on file for this patient.   Objective: Vital Signs: LMP 10/28/2013    Physical Exam   Musculoskeletal Exam: ***  CDAI Exam: CDAI Score: - Patient Global: -; Provider Global: - Swollen: -; Tender: - Joint Exam 02/11/2021   No joint exam has been documented for this visit   There is currently no information documented on the homunculus. Go to the Rheumatology activity and complete the homunculus joint exam.  Investigation: No additional findings.  Imaging: XR Foot 2 Views Left  Result Date: 01/12/2021 Extra-articular osteopenia was noted.  Narrowing of PIP and DIP joints and first MTP joints was noted.  Erosion was noted at the base of the fifth metatarsal.  Intertarsal, subtalar joint space narrowing was noted.  Inferior and posterior calcaneal spurs were noted. Impression: These findings are consistent with rheumatoid arthritis and osteoarthritis overlap.  XR Foot 2 Views Right  Result Date: 01/12/2021 Juxta-articular osteopenia was noted.  First MTP, PIP and DIP narrowing was noted.  Metatarsotarsal, and subtalar joint space narrowing was noted.  Inferior and posterior calcaneal spurs were noted. Impression: These findings are consistent with rheumatoid arthritis and osteoarthritis overlap.  XR Hand 2 View Left  Result Date: 01/12/2021 Juxta-articular osteopenia was noted.  PIP, DIP, metacarpocarpal, intercarpal and radiocarpal joint space narrowing was noted.  Erosive changes were noted in the PIP joints, all MCP joints, and carpal bones. Impression: These findings  are consistent with severe erosive rheumatoid arthritis.  XR Hand 2 View Right  Result Date: 01/12/2021 Juxta-articular osteopenia was noted.  First second and third MCP narrowing was noted.  PIP and DIP narrowing was noted.  Metacarpocarpal, intercarpal and radiocarpal joint space narrowing was noted.  Erosive changes were noted in the first the second and third MCP joints, all PIP joints, base of fifth metacarpal, and the carpal bones. Impression: These findings are consistent with severe erosive rheumatoid arthritis.  XR KNEE 3 VIEW LEFT  Result Date: 01/12/2021 Severe medial compartment narrowing with medial and intercondylar osteophytes was noted.  Lateral osteophytes were noted.  Severe patellofemoral narrowing was noted. Impression: These findings are consistent with severe end-stage osteoarthritis and severe chondromalacia patella.  XR KNEE 3 VIEW RIGHT  Result Date: 01/12/2021 Severe medial compartment narrowing with medial and intercondylar osteophytes was noted.  Lateral osteophytes were noted.  Severe patellofemoral narrowing was noted. Impression: These findings are consistent with severe end-stage osteoarthritis and severe chondromalacia patella.   Recent Labs: Lab Results  Component Value Date   WBC 6.7 01/12/2021   HGB 11.0 (L) 01/12/2021   PLT 305 01/12/2021   NA 142 01/12/2021   K 4.4 01/12/2021   CL 106 01/12/2021   CO2 21 01/12/2021   GLUCOSE 151 (H) 01/12/2021   BUN 16 01/12/2021   CREATININE 0.85 01/12/2021   BILITOT 0.6 01/12/2021  ALKPHOS 105 07/21/2020   AST 13 01/12/2021   ALT 12 01/12/2021   PROT 7.0 01/12/2021   PROT 7.0 01/12/2021   ALBUMIN 4.0 07/21/2020   CALCIUM 9.7 01/12/2021   GFRAA 86 01/12/2021   QFTBGOLDPLUS NEGATIVE 01/12/2021   January 12, 2021 SPEP normal, immunoglobulins normal, TB Gold negative, HIV negative, hepatitis B-, hepatitis C negative, CK 70, TSH normal, ESR 38, RF 996, anti-CCP> 250, uric acid 7.8  Speciality Comments: No  specialty comments available.  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  No orders of the defined types were placed in this encounter.   Face-to-face time spent with patient was *** minutes. Greater than 50% of time was spent in counseling and coordination of care.  Follow-Up Instructions: No follow-ups on file.   Bo Merino, MD  Note - This record has been created using Editor, commissioning.  Chart creation errors have been sought, but may not always  have been located. Such creation errors do not reflect on  the standard of medical care.

## 2021-02-06 ENCOUNTER — Ambulatory Visit: Payer: Medicaid Other | Admitting: Rheumatology

## 2021-02-11 ENCOUNTER — Ambulatory Visit: Payer: Medicaid Other | Admitting: Rheumatology

## 2021-02-11 DIAGNOSIS — M79671 Pain in right foot: Secondary | ICD-10-CM

## 2021-02-11 DIAGNOSIS — I482 Chronic atrial fibrillation, unspecified: Secondary | ICD-10-CM

## 2021-02-11 DIAGNOSIS — R042 Hemoptysis: Secondary | ICD-10-CM

## 2021-02-11 DIAGNOSIS — M1A09X1 Idiopathic chronic gout, multiple sites, with tophus (tophi): Secondary | ICD-10-CM

## 2021-02-11 DIAGNOSIS — I10 Essential (primary) hypertension: Secondary | ICD-10-CM

## 2021-02-11 DIAGNOSIS — E1165 Type 2 diabetes mellitus with hyperglycemia: Secondary | ICD-10-CM

## 2021-02-11 DIAGNOSIS — E559 Vitamin D deficiency, unspecified: Secondary | ICD-10-CM

## 2021-02-11 DIAGNOSIS — M79641 Pain in right hand: Secondary | ICD-10-CM

## 2021-02-11 DIAGNOSIS — M170 Bilateral primary osteoarthritis of knee: Secondary | ICD-10-CM

## 2021-02-11 DIAGNOSIS — Z87891 Personal history of nicotine dependence: Secondary | ICD-10-CM

## 2021-02-11 DIAGNOSIS — I5032 Chronic diastolic (congestive) heart failure: Secondary | ICD-10-CM

## 2021-02-11 DIAGNOSIS — Z7952 Long term (current) use of systemic steroids: Secondary | ICD-10-CM

## 2021-02-11 DIAGNOSIS — Z79899 Other long term (current) drug therapy: Secondary | ICD-10-CM

## 2021-02-11 DIAGNOSIS — Z8701 Personal history of pneumonia (recurrent): Secondary | ICD-10-CM

## 2021-02-11 DIAGNOSIS — G4733 Obstructive sleep apnea (adult) (pediatric): Secondary | ICD-10-CM

## 2021-02-11 DIAGNOSIS — M0579 Rheumatoid arthritis with rheumatoid factor of multiple sites without organ or systems involvement: Secondary | ICD-10-CM

## 2021-02-18 ENCOUNTER — Other Ambulatory Visit: Payer: Self-pay

## 2021-02-18 MED ORDER — PREDNISONE 5 MG PO TABS: 5.0000 mg | ORAL_TABLET | Freq: Every day | ORAL | 2 refills | Status: DC

## 2021-02-18 NOTE — Telephone Encounter (Signed)
Patient called stating she is in "a lot of pain" and requesting prescription of Prednsione 5 mg to be sent to [facility] at [address].  Patient states Dr. Rogers Blocker has prescribed this medication in the past, but told her she would need to contact Dr. Estanislado Pandy for refill.

## 2021-02-18 NOTE — Telephone Encounter (Signed)
Patient transferred care to Dr. Estanislado Pandy on 01/12/2021 and has only been seen once. She is due to follow up on 04/15/2021 due to no showing on 02/11/2021. According to Dr. Arlean Hopping note on 01/12/2021 "She has been on methotrexate and prednisone since 2015.  She has been on Orencia since May 2021." "The prednisone dose was gradually tapered and she has been on prednisone 5 mg p.o. daily for long time."   We have taken over prescribing the orencia and methotrexate. I attempted to contact patient to obtain more information but I was unable to reach the patient and her voicemail box is not set up.   Okay to refill prednisone 5mg ?

## 2021-02-18 NOTE — Telephone Encounter (Signed)
Spoke to patient and advised patient the refill of prednisone has been sent to the pharmacy. Patient verbalized understanding.

## 2021-02-23 ENCOUNTER — Other Ambulatory Visit: Payer: Self-pay | Admitting: Internal Medicine

## 2021-02-23 DIAGNOSIS — Z1231 Encounter for screening mammogram for malignant neoplasm of breast: Secondary | ICD-10-CM

## 2021-03-09 ENCOUNTER — Other Ambulatory Visit: Payer: Self-pay | Admitting: Internal Medicine

## 2021-03-10 LAB — COMPLETE METABOLIC PANEL WITH GFR
AG Ratio: 1.5 (calc) (ref 1.0–2.5)
ALT: 12 U/L (ref 6–29)
AST: 12 U/L (ref 10–35)
Albumin: 3.9 g/dL (ref 3.6–5.1)
Alkaline phosphatase (APISO): 86 U/L (ref 37–153)
BUN: 17 mg/dL (ref 7–25)
CO2: 23 mmol/L (ref 20–32)
Calcium: 9.1 mg/dL (ref 8.6–10.4)
Chloride: 103 mmol/L (ref 98–110)
Creat: 0.85 mg/dL (ref 0.50–0.99)
GFR, Est African American: 85 mL/min/{1.73_m2} (ref >=60)
GFR, Est Non African American: 73 mL/min/{1.73_m2} (ref >=60)
Globulin: 2.6 g/dL (calc) (ref 1.9–3.7)
Glucose, Bld: 245 mg/dL — ABNORMAL HIGH (ref 65–99)
Potassium: 3.8 mmol/L (ref 3.5–5.3)
Sodium: 140 mmol/L (ref 135–146)
Total Bilirubin: 0.9 mg/dL (ref 0.2–1.2)
Total Protein: 6.5 g/dL (ref 6.1–8.1)

## 2021-03-10 LAB — FOLATE: Folate: >24 ng/mL

## 2021-03-10 LAB — CBC
HCT: 34.4 % — ABNORMAL LOW (ref 35.0–45.0)
Hemoglobin: 11 g/dL — ABNORMAL LOW (ref 11.7–15.5)
MCH: 26.9 pg — ABNORMAL LOW (ref 27.0–33.0)
MCHC: 32 g/dL (ref 32.0–36.0)
MCV: 84.1 fL (ref 80.0–100.0)
MPV: 10.2 fL (ref 7.5–12.5)
Platelets: 341 10*3/uL (ref 140–400)
RBC: 4.09 10*6/uL (ref 3.80–5.10)
RDW: 15.1 % — ABNORMAL HIGH (ref 11.0–15.0)
WBC: 5.8 10*3/uL (ref 3.8–10.8)

## 2021-03-10 LAB — TSH: TSH: 1.95 mIU/L (ref 0.40–4.50)

## 2021-03-10 LAB — URIC ACID: Uric Acid, Serum: 8.2 mg/dL — ABNORMAL HIGH (ref 2.5–7.0)

## 2021-03-10 LAB — LIPID PANEL
Cholesterol: 108 mg/dL (ref <200)
HDL: 38 mg/dL — ABNORMAL LOW (ref >=50)
LDL Cholesterol (Calc): 54 mg/dL (calc)
Non-HDL Cholesterol (Calc): 70 mg/dL (calc) (ref <130)
Total CHOL/HDL Ratio: 2.8 (calc) (ref <5.0)
Triglycerides: 81 mg/dL (ref <150)

## 2021-03-10 LAB — VITAMIN D 25 HYDROXY (VIT D DEFICIENCY, FRACTURES): Vit D, 25-Hydroxy: 44 ng/mL (ref 30–100)

## 2021-03-10 LAB — VITAMIN B12: Vitamin B-12: 387 pg/mL (ref 200–1100)

## 2021-03-23 ENCOUNTER — Other Ambulatory Visit: Payer: Self-pay

## 2021-03-23 NOTE — Telephone Encounter (Signed)
Patient left a voicemail requesting prescription refill of Orencia to be sent to the specialty pharmacy.

## 2021-03-24 ENCOUNTER — Other Ambulatory Visit: Payer: Self-pay | Admitting: Physician Assistant

## 2021-03-24 DIAGNOSIS — R1310 Dysphagia, unspecified: Secondary | ICD-10-CM

## 2021-03-24 MED ORDER — ABATACEPT 125 MG/ML ~~LOC~~ SOAJ: SUBCUTANEOUS | 2 refills | Status: DC

## 2021-03-24 NOTE — Telephone Encounter (Signed)
Next Visit: 04/15/2021  Last Visit: 01/12/2021  Last Fill: 01/26/2021  DX: Rheumatoid arthritis with rheumatoid factor of multiple sites without organ or systems involvement   Current Dose per office note 01/12/2021, orencia 125 mg sq injections once weekly  Labs: 01/12/2021, Glucose 151, Hemoglobin 11.0, HCT 34.2, RDW 16.5,   TB Gold: 01/12/2021, negative  Okay to refill Orencia?

## 2021-03-25 ENCOUNTER — Telehealth: Payer: Self-pay | Admitting: *Deleted

## 2021-03-25 DIAGNOSIS — I700 Atherosclerosis of aorta: Secondary | ICD-10-CM

## 2021-03-25 NOTE — Telephone Encounter (Signed)
/  Patient with diagnosis of atrial fibrillation on Eliquis for anticoagulation.  /  Procedure: colonoscopy/endoscopy Date of procedure: 07/09/21   CHA2DS2-VASc Score = 4  This indicates a 4.8% annual risk of stroke. The patient's score is based upon: CHF History: Yes HTN History: Yes Diabetes History: Yes Stroke History: No Vascular Disease History: No Age Score: 0 Gender Score: 1    CrCl 120.1 (56.8 with IBW) Platelet count 341  Per office protocol, patient can hold Eliquis for 2 days prior to procedure.   Patient will not need bridging with Lovenox (enoxaparin) around procedure.  Please note that this request is > 90 days to procedure.  Would recommend that it be reviewed at a date closer to Sept 1, as any changes in her health could alter our recommendation.

## 2021-03-25 NOTE — Telephone Encounter (Signed)
   [facility] Pre-operative Risk Assessment    Patient Name: Kathleen Kane  DOB: 1961/01/11  MRN: [id number]   HEARTCARE STAFF: - Please ensure there is not already an duplicate clearance open for this procedure. - Under Visit Info/Reason for Call, type in Other and utilize the format Clearance MM/DD/YY or Clearance TBD. Do not use dashes or single digits. - If request is for dental extraction, please clarify the # of teeth to be extracted.  Request for surgical clearance:  1. What type of surgery is being performed? COLONOSCOPY/ENDOSCOPY   2. When is this surgery scheduled? 07/09/21   3. What type of clearance is required (medical clearance vs. Pharmacy clearance to hold med vs. Both)? BOTH  4. Are there any medications that need to be held prior to surgery and how long? ELIQUIS   5. Practice name and name of physician performing surgery? [facility]; DR. Paulita Fujita   6. What is the office phone number? [phone]   7.   What is the office fax number? [phone]  8.   Anesthesia type (None, local, MAC, general) ? PROPOFOL   Julaine Hua 03/25/2021, 11:51 AM  _________________________________________________________________   (provider comments below)

## 2021-03-25 NOTE — Telephone Encounter (Signed)
Will route to PharmD for rec's re: holding anticoagulation. Richardson Dopp, PA-C    03/25/2021 12:32 PM

## 2021-03-27 NOTE — Progress Notes (Signed)
Office Visit Note  Patient: Kathleen Kane             Date of Birth: 1961/02/25           MRN: [id number]             PCP: [name], MD Referring: [name], MD Visit Date: 04/02/2021 Occupation: '@GUAROCC' @  Subjective:  Pain in multiple joints.   History of Present Illness: Kathleen Kane is a 60 y.o. female with seropositive rheumatoid arthritis.  Patient states she ran out of [place] and then she got the prescription.  She missed about 3 doses of Orencia.  She has been having a flare with increased pain and discomfort in multiple joints.  She describes discomfort in her shoulders, hands and her knees.  She would like a referral to orthopedic surgeon for resection of rheumatoid nodules on her elbows.  She states she is in constant pain.  She has tried pain management in the past which was not very effective.  Activities of Daily Living:  Patient reports morning stiffness for 24 hours.   Patient Reports nocturnal pain.  Difficulty dressing/grooming: Reports Difficulty climbing stairs: Reports Difficulty getting out of chair: Reports Difficulty using hands for taps, buttons, cutlery, and/or writing: Reports  Review of Systems  Constitutional: Positive for fatigue.  HENT: Negative for mouth sores, mouth dryness and nose dryness.   Eyes: Positive for itching. Negative for pain, visual disturbance and dryness.  Respiratory: Positive for cough. Negative for hemoptysis, shortness of breath and difficulty breathing.   Cardiovascular: Positive for swelling in legs/feet. Negative for chest pain and palpitations.  Gastrointestinal: Negative for abdominal pain, blood in stool, constipation and diarrhea.  Endocrine: Negative for increased urination.  Genitourinary: Negative for painful urination.  Musculoskeletal: Positive for arthralgias, joint pain, joint swelling, myalgias, morning stiffness and myalgias. Negative for muscle weakness and muscle tenderness.  Skin: Negative for  color change, rash and redness.  Allergic/Immunologic: Negative for susceptible to infections.  Neurological: Positive for numbness, headaches and weakness. Negative for dizziness and memory loss.  Hematological: Positive for bruising/bleeding tendency. Negative for swollen glands.  Psychiatric/Behavioral: Positive for sleep disturbance. Negative for confusion.    PMFS History:  Patient Active Problem List   Diagnosis Date Noted  . Acute otalgia, right 11/26/2020  . Arthralgia of right temporomandibular joint 11/26/2020  . Bilateral impacted cerumen 11/26/2020  . HCAP (healthcare-associated pneumonia) 10/02/2018  . CHF exacerbation ([place]) 10/01/2018  . Community acquired pneumonia 10/01/2018  . OSA (obstructive sleep apnea) 02/10/2017  . Carpal tunnel syndrome, right 12/15/2016  . Extensor tenosynovitis of wrist, right 12/15/2016  . Bilateral primary osteoarthritis of knee 10/08/2015  . Rheumatoid arthritis, 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[place]) 10/08/2015  . Acute on chronic diastolic CHF (congestive heart failure) ([place]) 08/08/2015  . Morbid obesity ([place]) 05/06/2015  . Essential hypertension 04/10/2015  . 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 Primary osteoarthritis involving multiple joints 04/09/2015  . Elevated uric acid in blood 01/23/2014  . High risk medication use 01/23/2014  . Numbness and tingling in left hand 10/01/2013  . Tobacco abuse 10/01/2013  . Elevated CA-125 02/06/2013  . Pelvic mass in female 01/29/2013  . Diabetes mellitus ([place]) 08/10/2012  . Atrial fibrillation ([place])   . Chronic diastolic CHF (congestive heart failure) (HCC)     Past Medical History:  Diagnosis Date  . Acute on chronic diastolic CHF (congestive heart failure) ([place]) 08/08/2015  . Anxiety   . Atrial fibrillation, persistent ([place])   .  Carpal tunnel syndrome, right 12/15/2016  . CHF (congestive heart failure) ([place]) 2013   No echo found from that time, most likely diastolic  . CHF  exacerbation ([place]) 10/01/2018  . Depression   . Diabetes mellitus ([place]) 08/10/2012  . Diabetes mellitus without complication ([place])   . Hypertension   . 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 Morbid obesity ([place]) 05/06/2015  . Obesity   . Obstructive sleep apnea    Sleep study performed 10/18/2008. AHI-6.8/hr, during REM-27.3/hr. RDI-25.0/hr, during REM-40.9/hr. avg o2 sat during REM and NREM 95%  . OSA (obstructive sleep apnea) 02/10/2017  . Pelvic mass in female 01/29/2013   With ascites   . Pneumonia 10/02/2018  . Rheumatoid arthritis ([place])   . Rheumatoid arthritis, 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[place]) 10/08/2015    Family History  Problem Relation Age of Onset  . Hypertension Mother   . Diabetes Mother   . Throat cancer Mother   . Hypertension Father   . Diabetes Paternal Grandmother   . Diabetes Sister   . Heart Problems Sister   . Kidney failure Sister   . Hypertension Brother   . Diabetes Brother   . Healthy Son   . Autoimmune disease Daughter    Past Surgical History:  Procedure Laterality Date  . CARDIOVASCULAR STRESS TEST  02/16/2013   no significant EKG changes with Lexiscan, normal LV function and normal wall function  . CESAREAN SECTION     x2  . DOPPLER ECHOCARDIOGRAPHY  01/24/2008   EF 55-60%, no diagnostic evidence of LV wall motion abnormalities, LV wall thickness was mild-moderately increased.  Marland Kitchen LAPAROTOMY Right 02/27/2013   Procedure: EXPLORATORY LAPAROTOMY, right salpingo-oopherectomy;  Surgeon: [name], MD;  Location: [facility];  Service: Gynecology;  Laterality: Right;  . left breast cyst    . SALPINGOOPHORECTOMY Right 02/27/2013   Procedure: SALPINGO OOPHORECTOMY;  Surgeon: [name], MD;  Location: [facility];  Service: Gynecology;  Laterality: Right;   Social History   Social History Narrative  . Not on file    There is no immunization history on file for this patient.   Objective: Vital Signs: BP 123/80 (BP Location: Left Arm,  Patient Position: Sitting, Cuff Size: Large)   Pulse 70   Ht 5' 2.75" (1.594 m)   Wt 247 lb (112 kg)   LMP 10/28/2013   BMI 44.10 kg/m    Physical Exam Vitals and nursing note reviewed.  Constitutional:      Appearance: She is well-developed.  HENT:     Head: Normocephalic and atraumatic.  Eyes:     Conjunctiva/sclera: Conjunctivae normal.  Cardiovascular:     Rate and Rhythm: Normal rate and regular rhythm.     Heart sounds: Normal heart sounds.  Pulmonary:     Effort: Pulmonary effort is normal.     Breath sounds: Normal breath sounds.  Abdominal:     General: Bowel sounds are normal.     Palpations: Abdomen is soft.  Musculoskeletal:     Cervical back: Normal range of motion.  Lymphadenopathy:     Cervical: No cervical adenopathy.  Skin:    General: Skin is warm and dry.     Capillary Refill: Capillary refill takes less than 2 seconds.  Neurological:     Mental Status: She is alert and oriented to person, place, and time.  Psychiatric:        Behavior: Behavior normal.      Musculoskeletal Exam: C-spine was in good range.  She had  discomfort range of motion of bilateral shoulders.  Elbow joints were in good range of motion.  She had limited range of motion of her elbow joints.  She had left wrist extensor tenosynovitis.  There was no synovitis over MCPs or PIPs.  Rheumatoid nodules were noted over bilateral elbows.  Hip joints were difficult to assess in the sitting position.  Knee joints were warm to touch today.  There was no tenderness over ankles or MTPs.  CDAI Exam: CDAI Score: 9  Patient Global: 5 mm; Provider Global: 5 mm Swollen: 3 ; Tender: 5  Joint Exam 04/02/2021      Right  Left  Glenohumeral   Tender   Tender  Wrist     Swollen Tender  Knee  Swollen Tender  Swollen Tender     Investigation: No additional findings.  Imaging: No results found.  Recent Labs: Lab Results  Component Value Date   WBC 5.8 03/09/2021   HGB 11.0 (L) 03/09/2021    PLT 341 03/09/2021   NA 140 03/09/2021   K 3.8 03/09/2021   CL 103 03/09/2021   CO2 23 03/09/2021   GLUCOSE 245 (H) 03/09/2021   BUN 17 03/09/2021   CREATININE 0.85 03/09/2021   BILITOT 0.9 03/09/2021   ALKPHOS 105 07/21/2020   AST 12 03/09/2021   ALT 12 03/09/2021   PROT 6.5 03/09/2021   ALBUMIN 4.0 07/21/2020   CALCIUM 9.1 03/09/2021   GFRAA 85 03/09/2021   QFTBGOLDPLUS NEGATIVE 01/12/2021   January 12, 2021 SPEP normal, TB Gold negative, IgA mildly elevated, hepatitis C negative, hepatitis B-, hepatitis C negative, HIV negative, CK 70, TSH normal, RF 986, anti-CCP> 250, ESR 38, uric acid 7.8  Speciality Comments: dxd 2015 at [facility].  An inadequate response to Plaquenil, Humira, Remicade.  On methotrexate and prednisone since 2015.  Orencia added May 2021.  Prednisone 5 mg p.o. daily  Procedures:  No procedures performed Allergies: Ace inhibitors and Lisinopril   Assessment / Plan:     Visit Diagnoses: Rheumatoid arthritis with rheumatoid factor of multiple sites without organ or systems involvement (HCC) - Positive RF, positive anti-CCP, erosive RA dxd [id number].  She has tried Plaquenil, Humira and Remicade in the past.  She has been on methotrexate and prednisone since 2015.  She has been on Orencia since May 2021.  She states this combination has worked well for her.  Although she missed last 3 doses of Orencia which caused a flare.  She is having increased pain and discomfort in multiple joints today.  Some synovitis was noted on the examination today.  Have advised her to resume Orencia.  High risk medication use - Methotrexate 20 mg weekly, prednisone 5 mg p.o. daily, Orencia 125 mg subcu weekly since May 2021.  Inadequate response to Plaquenil Humira and Remicade. - Plan: CBC with Differential/Platelet, COMPLETE METABOLIC PANEL WITH GFR today and then every 3 months to monitor for drug toxicity.  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  We will make a referral for her.  Plan: Ambulatory referral to Orthopedic Surgery  Current chronic use of systemic steroids - Since 2015.  According to patient she has been unable to taper prednisone.  Pain in both hands - X-rays were consistent with osteoarthritis and erosive rheumatoid arthritis overlap.  Primary osteoarthritis of both knees - Bilateral severe osteoarthritis and severe chondromalacia patella.  Pain in both feet - Bilateral severe osteoarthritis and rheumatoid arthritis.  Idiopathic chronic gout of multiple sites with tophus - Diagnosed with gout at [facility].  She discontinued allopurinol after few years.  She denies having any gout flares.  She has mild hyperuricemia.  Hemoptysis - Patient was to see ENT for evaluation of possible throat cancer.  History of throat cancer in the mother.  Chronic diastolic CHF (congestive heart failure) (HCC)  Chronic atrial fibrillation (HCC)  Essential hypertension-blood pressure is normal today.  Type 2 diabetes mellitus with hyperglycemia, with long-term current use of insulin (HCC)  OSA (obstructive sleep apnea)  Vitamin D deficiency -she has a history of vitamin D deficiency and has been taking vitamin D.  Plan: VITAMIN D 25 Hydroxy (Vit-D Deficiency, Fractures)  History of pneumonia - 2020  Former smoker - 1 pack/day for 30 years.  She quit smoking in 2016.  Orders: Orders Placed This Encounter  Procedures  . CBC with Differential/Platelet  . COMPLETE METABOLIC PANEL WITH GFR  . VITAMIN D 25 Hydroxy (Vit-D Deficiency, Fractures)  . Ambulatory referral to Orthopedic Surgery   No orders of the defined types were placed in this encounter.    Follow-Up Instructions: Return in about 3 months (around 07/03/2021) for Rheumatoid arthritis.   Bo Merino, MD  Note - This record has been created using Editor, commissioning.  Chart creation errors have been sought, but may not  always  have been located. Such creation errors do not reflect on  the standard of medical care.

## 2021-03-30 ENCOUNTER — Telehealth: Payer: Self-pay

## 2021-03-30 NOTE — Telephone Encounter (Signed)
Caryl Pina from [place] called checking the status of the prior authorization paperwork for patient's Orencia medication that was faxed to to Dr. Estanislado Pandy.  Please complete and return PA so patient will receive her injection on time.  If you have any questions, please call back at [phone]

## 2021-03-30 NOTE — Telephone Encounter (Signed)
Received notification from [employer] regarding a prior authorization for [facility]. Authorization has been APPROVED from 03/30/21 to 03/30/22.   Authorization # [id number]  Faxing Authorization form with approval letter back to [place]

## 2021-03-30 NOTE — Telephone Encounter (Signed)
Submitted a Prior Authorization request to [facility] for [facility] via CoverMyMeds. Will update once we receive a response.   Key: [id number]

## 2021-04-02 ENCOUNTER — Ambulatory Visit: Payer: Medicaid Other | Admitting: Rheumatology

## 2021-04-02 ENCOUNTER — Other Ambulatory Visit: Payer: Self-pay

## 2021-04-02 ENCOUNTER — Encounter: Payer: Self-pay | Admitting: Rheumatology

## 2021-04-02 VITALS — BP 123/80 | HR 70 | Ht 62.75 in | Wt 247.0 lb

## 2021-04-02 DIAGNOSIS — M0579 Rheumatoid arthritis with rheumatoid factor of multiple sites without organ or systems involvement: Secondary | ICD-10-CM

## 2021-04-02 DIAGNOSIS — E1165 Type 2 diabetes mellitus with hyperglycemia: Secondary | ICD-10-CM

## 2021-04-02 DIAGNOSIS — G4733 Obstructive sleep apnea (adult) (pediatric): Secondary | ICD-10-CM

## 2021-04-02 DIAGNOSIS — I10 Essential (primary) hypertension: Secondary | ICD-10-CM

## 2021-04-02 DIAGNOSIS — M79641 Pain in right hand: Secondary | ICD-10-CM

## 2021-04-02 DIAGNOSIS — M170 Bilateral primary osteoarthritis of knee: Secondary | ICD-10-CM

## 2021-04-02 DIAGNOSIS — Z79899 Other long term (current) drug therapy: Secondary | ICD-10-CM | POA: Diagnosis not present

## 2021-04-02 DIAGNOSIS — M79672 Pain in left foot: Secondary | ICD-10-CM

## 2021-04-02 DIAGNOSIS — Z8701 Personal history of pneumonia (recurrent): Secondary | ICD-10-CM

## 2021-04-02 DIAGNOSIS — M1A09X1 Idiopathic chronic gout, multiple sites, with tophus (tophi): Secondary | ICD-10-CM

## 2021-04-02 DIAGNOSIS — R042 Hemoptysis: Secondary | ICD-10-CM

## 2021-04-02 DIAGNOSIS — M0630 Rheumatoid nodule, unspecified site: Secondary | ICD-10-CM

## 2021-04-02 DIAGNOSIS — I482 Chronic atrial fibrillation, unspecified: Secondary | ICD-10-CM

## 2021-04-02 DIAGNOSIS — Z7952 Long term (current) use of systemic steroids: Secondary | ICD-10-CM

## 2021-04-02 DIAGNOSIS — M79642 Pain in left hand: Secondary | ICD-10-CM

## 2021-04-02 DIAGNOSIS — Z87891 Personal history of nicotine dependence: Secondary | ICD-10-CM

## 2021-04-02 DIAGNOSIS — I5032 Chronic diastolic (congestive) heart failure: Secondary | ICD-10-CM

## 2021-04-02 DIAGNOSIS — M79671 Pain in right foot: Secondary | ICD-10-CM

## 2021-04-02 DIAGNOSIS — E559 Vitamin D deficiency, unspecified: Secondary | ICD-10-CM

## 2021-04-02 DIAGNOSIS — Z794 Long term (current) use of insulin: Secondary | ICD-10-CM

## 2021-04-02 NOTE — Patient Instructions (Addendum)
Standing Labs We placed an order today for your standing lab work.   Please have your standing labs drawn in August and every 3 months  If possible, please have your labs drawn 2 weeks prior to your appointment so that the provider can discuss your results at your appointment.  Please note that you may see your imaging and lab results in [facility] before we have reviewed them. We may be awaiting multiple results to interpret others before contacting you. Please allow our office up to 72 hours to thoroughly review all of the results before contacting the office for clarification of your results.  We have open lab daily: Monday through Thursday from 1:30-4:30 PM and Friday from 1:30-4:00 PM at the office of Dr. Bo Merino, [facility].   Please be advised, all patients with office appointments requiring lab work will take precedent over walk-in lab work.  If possible, please come for your lab work on Monday and Friday afternoons, as you may experience shorter wait times. The office is located at [address] [place], [place], [place] [address] No appointment is necessary.   Labs are drawn by [facility]. Please bring your co-pay at the time of your lab draw.  You may receive a bill from [facility] for your lab work.  If you wish to have your labs drawn at another location, please call the office 24 hours in advance to send orders.  If you have any questions regarding directions or hours of operation,  please call [phone].   As a reminder, please drink plenty of water prior to coming for your lab work. Thanks!  Vaccines You are taking a medication(s) that can suppress your immune system.  The following immunizations are recommended: . Flu annually . Covid-19  . Td/Tdap (tetanus, diphtheria, pertussis) every 10 years . Pneumonia (Prevnar 15 then Pneumovax 23 at least 1 year apart.  Alternatively, can take Prevnar 20 without needing additional dose) . Shingrix (after age  26): 2 doses from 4 weeks to 6 months apart  Please check with your PCP to make sure you are up to date.  If you test POSITIVE for COVID19 and have MILD to MODERATE symptoms: o First, call your PCP if you would like to receive COVID19 treatment AND o Hold your medications during the infection and for at least 1 week after your symptoms have resolved: - Injectable medication (Benlysta, Cimzia, Cosentyx, Enbrel, Humira, Orencia, Remicade, Simponi, Stelara, Taltz, Tremfya) - Methotrexate - Leflunomide (Arava) - Mycophenolate (Cellcept) - Morrie Sheldon, Olumiant, or Rinvoq o If you take Actemra or Kevzara, you DO NOT need to hold these for COVID19 infection.  If you test POSITIVE for COVID19 and have NO symptoms: o First, call your PCP if you would like to receive COVID19 treatment AND o Hold your medications for at least 10 days after the day that you tested positive - Injectable medication (Benlysta, Cimzia, Cosentyx, Enbrel, Humira, Orencia, Remicade, Simponi, Stelara, Taltz, Tremfya) - Methotrexate - Leflunomide (Arava) - Mycophenolate (Cellcept) - Morrie Sheldon, Olumiant, or Rinvoq o If you take Actemra or Kevzara, you DO NOT need to hold these for COVID19 infection.  If you have signs or symptoms of an infection or start antibiotics: . First, call your PCP for workup of your infection. . Hold your medication through the infection, until you complete your antibiotics, and until symptoms resolve if you take the following: o Injectable medication (Actemra, Benlysta, Cimzia, Cosentyx, Enbrel, Humira, Kevzara, Orencia, Remicade, Simponi, Stelara, Taltz, Tremfya) o Methotrexate o Leflunomide (  Arava) o Mycophenolate (Cellcept) o Morrie Sheldon, Olumiant, or Rinvoq  Heart Disease Prevention   Your inflammatory disease increases your risk of heart disease which includes heart attack, stroke, atrial fibrillation (irregular heartbeats), high blood pressure, heart failure and atherosclerosis (plaque in the  arteries).  It is important to reduce your risk by:   . Keep blood pressure, cholesterol, and blood sugar at healthy levels   . Smoking Cessation   . Maintain a healthy weight  o BMI 20-25   . Eat a healthy diet  o Plenty of fresh fruit, vegetables, and whole grains  o Limit saturated fats, foods high in sodium, and added sugars  o DASH and Mediterranean diet   . Increase physical activity  o Recommend moderate physically activity for 150 minutes per week/ 30 minutes a day for five days a week These can be broken up into three separate ten-minute sessions during the day.   . Reduce Stress  . Meditation, slow breathing exercises, yoga, coloring books  . Dental visits twice a year    Shoulder Exercises Ask your health care provider which exercises are safe for you. Do exercises exactly as told by your health care provider and adjust them as directed. It is normal to feel mild stretching, pulling, tightness, or discomfort as you do these exercises. Stop right away if you feel sudden pain or your pain gets worse. Do not begin these exercises until told by your health care provider. Stretching exercises External rotation and abduction This exercise is sometimes called corner stretch. This exercise rotates your arm outward (external rotation) and moves your arm out from your body (abduction). 1. Stand in a doorway with one of your feet slightly in front of the other. This is called a staggered stance. If you cannot reach your forearms to the door frame, stand facing a corner of a room. 2. Choose one of the following positions as told by your health care provider: ? Place your hands and forearms on the door frame above your head. ? Place your hands and forearms on the door frame at the height of your head. ? Place your hands on the door frame at the height of your elbows. 3. Slowly move your weight onto your front foot until you feel a stretch across your chest and in the front of your  shoulders. Keep your head and chest upright and keep your abdominal muscles tight. 4. Hold for __________ seconds. 5. To release the stretch, shift your weight to your back foot. Repeat __________ times. Complete this exercise __________ times a day.   Extension, standing 1. Stand and hold a broomstick, a cane, or a similar object behind your back. ? Your hands should be a little wider than shoulder width apart. ? Your palms should face away from your back. 2. Keeping your elbows straight and your shoulder muscles relaxed, move the stick away from your body until you feel a stretch in your shoulders (extension). ? Avoid shrugging your shoulders while you move the stick. Keep your shoulder blades tucked down toward the middle of your back. 3. Hold for __________ seconds. 4. Slowly return to the starting position. Repeat __________ times. Complete this exercise __________ times a day. Range-of-motion exercises Pendulum 1. Stand near a wall or a surface that you can hold onto for balance. 2. Bend at the waist and let your left / right arm hang straight down. Use your other arm to support you. Keep your back straight and do not lock your knees.  3. Relax your left / right arm and shoulder muscles, and move your hips and your trunk so your left / right arm swings freely. Your arm should swing because of the motion of your body, not because you are using your arm or shoulder muscles. 4. Keep moving your hips and trunk so your arm swings in the following directions, as told by your health care provider: ? Side to side. ? Forward and backward. ? In clockwise and counterclockwise circles. 5. Continue each motion for __________ seconds, or for as long as told by your health care provider. 6. Slowly return to the starting position. Repeat __________ times. Complete this exercise __________ times a day.   Shoulder flexion, standing 1. Stand and hold a broomstick, a cane, or a similar object. Place your  hands a little more than shoulder width apart on the object. Your left / right hand should be palm up, and your other hand should be palm down. 2. Keep your elbow straight and your shoulder muscles relaxed. Push the stick up with your healthy arm to raise your left / right arm in front of your body, and then over your head until you feel a stretch in your shoulder (flexion). ? Avoid shrugging your shoulder while you raise your arm. Keep your shoulder blade tucked down toward the middle of your back. 3. Hold for __________ seconds. 4. Slowly return to the starting position. Repeat __________ times. Complete this exercise __________ times a day.   Shoulder abduction, standing 1. Stand and hold a broomstick, a cane, or a similar object. Place your hands a little more than shoulder width apart on the object. Your left / right hand should be palm up, and your other hand should be palm down. 2. Keep your elbow straight and your shoulder muscles relaxed. Push the object across your body toward your left / right side. Raise your left / right arm to the side of your body (abduction) until you feel a stretch in your shoulder. ? Do not raise your arm above shoulder height unless your health care provider tells you to do that. ? If directed, raise your arm over your head. ? Avoid shrugging your shoulder while you raise your arm. Keep your shoulder blade tucked down toward the middle of your back. 3. Hold for __________ seconds. 4. Slowly return to the starting position. Repeat __________ times. Complete this exercise __________ times a day. Internal rotation 1. Place your left / right hand behind your back, palm up. 2. Use your other hand to dangle an exercise band, a towel, or a similar object over your shoulder. Grasp the band with your left / right hand so you are holding on to both ends. 3. Gently pull up on the band until you feel a stretch in the front of your left / right shoulder. The movement of your  arm toward the center of your body is called internal rotation. ? Avoid shrugging your shoulder while you raise your arm. Keep your shoulder blade tucked down toward the middle of your back. 4. Hold for __________ seconds. 5. Release the stretch by letting go of the band and lowering your hands. Repeat __________ times. Complete this exercise __________ times a day.   Strengthening exercises External rotation 1. Sit in a stable chair without armrests. 2. Secure an exercise band to a stable object at elbow height on your left / right side. 3. Place a soft object, such as a folded towel or a small pillow, between  your left / right upper arm and your body to move your elbow about 4 inches (10 cm) away from your side. 4. Hold the end of the exercise band so it is tight and there is no slack. 5. Keeping your elbow pressed against the soft object, slowly move your forearm out, away from your abdomen (external rotation). Keep your body steady so only your forearm moves. 6. Hold for __________ seconds. 7. Slowly return to the starting position. Repeat __________ times. Complete this exercise __________ times a day.   Shoulder abduction 1. Sit in a stable chair without armrests, or stand up. 2. Hold a __________ weight in your left / right hand, or hold an exercise band with both hands. 3. Start with your arms straight down and your left / right palm facing in, toward your body. 4. Slowly lift your left / right hand out to your side (abduction). Do not lift your hand above shoulder height unless your health care provider tells you that this is safe. ? Keep your arms straight. ? Avoid shrugging your shoulder while you do this movement. Keep your shoulder blade tucked down toward the middle of your back. 5. Hold for __________ seconds. 6. Slowly lower your arm, and return to the starting position. Repeat __________ times. Complete this exercise __________ times a day.   Shoulder extension 1. Sit in a  stable chair without armrests, or stand up. 2. Secure an exercise band to a stable object in front of you so it is at shoulder height. 3. Hold one end of the exercise band in each hand. Your palms should face each other. 4. Straighten your elbows and lift your hands up to shoulder height. 5. Step back, away from the secured end of the exercise band, until the band is tight and there is no slack. 6. Squeeze your shoulder blades together as you pull your hands down to the sides of your thighs (extension). Stop when your hands are straight down by your sides. Do not let your hands go behind your body. 7. Hold for __________ seconds. 8. Slowly return to the starting position. Repeat __________ times. Complete this exercise __________ times a day. Shoulder row 1. Sit in a stable chair without armrests, or stand up. 2. Secure an exercise band to a stable object in front of you so it is at waist height. 3. Hold one end of the exercise band in each hand. Position your palms so that your thumbs are facing the ceiling (neutral position). 4. Bend each of your elbows to a 90-degree angle (right angle) and keep your upper arms at your sides. 5. Step back until the band is tight and there is no slack. 6. Slowly pull your elbows back behind you. 7. Hold for __________ seconds. 8. Slowly return to the starting position. Repeat __________ times. Complete this exercise __________ times a day. Shoulder press-ups 1. Sit in a stable chair that has armrests. Sit upright, with your feet flat on the floor. 2. Put your hands on the armrests so your elbows are bent and your fingers are pointing forward. Your hands should be about even with the sides of your body. 3. Push down on the armrests and use your arms to lift yourself off the chair. Straighten your elbows and lift yourself up as much as you comfortably can. ? Move your shoulder blades down, and avoid letting your shoulders move up toward your ears. ? Keep your  feet on the ground. As you get stronger, your  feet should support less of your body weight as you lift yourself up. 4. Hold for __________ seconds. 5. Slowly lower yourself back into the chair. Repeat __________ times. Complete this exercise __________ times a day.   Wall push-ups 1. Stand so you are facing a stable wall. Your feet should be about one arm-length away from the wall. 2. Lean forward and place your palms on the wall at shoulder height. 3. Keep your feet flat on the floor as you bend your elbows and lean forward toward the wall. 4. Hold for __________ seconds. 5. Straighten your elbows to push yourself back to the starting position. Repeat __________ times. Complete this exercise __________ times a day.   This information is not intended to replace advice given to you by your health care provider. Make sure you discuss any questions you have with your health care provider. Document Revised: 02/16/2019 Document Reviewed: 11/24/2018 Elsevier Patient Education  2021 [employer] for [employer]ers, 15(4), [phone]. Retrieved August 14, 2018 from http://clinicalkey.com/nursing">  Knee Exercises Ask your health care provider which exercises are safe for you. Do exercises exactly as told by your health care provider and adjust them as directed. It is normal to feel mild stretching, pulling, tightness, or discomfort as you do these exercises. Stop right away if you feel sudden pain or your pain gets worse. Do not begin these exercises until told by your health care provider. Stretching and range-of-motion exercises These exercises warm up your muscles and joints and improve the movement and flexibility of your knee. These exercises also help to relieve pain and swelling. Knee extension, prone 6. Lie on your abdomen (prone position) on a bed. 7. Place your left / right knee just beyond the edge of the surface so your knee is not on the bed. You can put a towel under your  left / right thigh just above your kneecap for comfort. 8. Relax your leg muscles and allow gravity to straighten your knee (extension). You should feel a stretch behind your left / right knee. 9. Hold this position for __________ seconds. 10. Scoot up so your knee is supported between repetitions. Repeat __________ times. Complete this exercise __________ times a day. Knee flexion, active 5. Lie on your back with both legs straight. If this causes back discomfort, bend your left / right knee so your foot is flat on the floor. 6. Slowly slide your left / right heel back toward your buttocks. Stop when you feel a gentle stretch in the front of your knee or thigh (flexion). 7. Hold this position for __________ seconds. 8. Slowly slide your left / right heel back to the starting position. Repeat __________ times. Complete this exercise __________ times a day.   Quadriceps stretch, prone 7. Lie on your abdomen on a firm surface, such as a bed or padded floor. 8. Bend your left / right knee and hold your ankle. If you cannot reach your ankle or pant leg, loop a belt around your foot and grab the belt instead. 9. Gently pull your heel toward your buttocks. Your knee should not slide out to the side. You should feel a stretch in the front of your thigh and knee (quadriceps). 10. Hold this position for __________ seconds. Repeat __________ times. Complete this exercise __________ times a day.   Hamstring, supine 5. Lie on your back (supine position). 6. Loop a belt or towel over the ball of your left / right foot. The ball of your foot  is on the walking surface, right under your toes. 7. Straighten your left / right knee and slowly pull on the belt to raise your leg until you feel a gentle stretch behind your knee (hamstring). ? Do not let your knee bend while you do this. ? Keep your other leg flat on the floor. 8. Hold this position for __________ seconds. Repeat __________ times. Complete this  exercise __________ times a day. Strengthening exercises These exercises build strength and endurance in your knee. Endurance is the ability to use your muscles for a long time, even after they get tired. Quadriceps, isometric This exercise stretches the muscles in front of your thigh (quadriceps) without moving your knee joint (isometric). 5. Lie on your back with your left / right leg extended and your other knee bent. Put a rolled towel or small pillow under your knee if told by your health care provider. 6. Slowly tense the muscles in the front of your left / right thigh. You should see your kneecap slide up toward your hip or see increased dimpling just above the knee. This motion will push the back of the knee toward the floor. 7. For __________ seconds, hold the muscle as tight as you can without increasing your pain. 8. Relax the muscles slowly and completely. Repeat __________ times. Complete this exercise __________ times a day.   Straight leg raises This exercise stretches the muscles in front of your thigh (quadriceps) and the muscles that move your hips (hip flexors). 6. Lie on your back with your left / right leg extended and your other knee bent. 7. Tense the muscles in the front of your left / right thigh. You should see your kneecap slide up or see increased dimpling just above the knee. Your thigh may even shake a bit. 8. Keep these muscles tight as you raise your leg 4-6 inches (10-15 cm) off the floor. Do not let your knee bend. 9. Hold this position for __________ seconds. 10. Keep these muscles tense as you lower your leg. 11. Relax your muscles slowly and completely after each repetition. Repeat __________ times. Complete this exercise __________ times a day. Hamstring, isometric 8. Lie on your back on a firm surface. [place]on your left / right knee about __________ degrees. 10. Dig your left / right heel into the surface as if you are trying to pull it toward your  buttocks. Tighten the muscles in the back of your thighs (hamstring) to "dig" as hard as you can without increasing any pain. 11. Hold this position for __________ seconds. 12. Release the tension gradually and allow your muscles to relax completely for __________ seconds after each repetition. Repeat __________ times. Complete this exercise __________ times a day. Hamstring curls If told by your health care provider, do this exercise while wearing ankle weights. Begin with __________ lb weights. Then increase the weight by 1 lb (0.5 kg) increments. Do not wear ankle weights that are more than __________ lb. 7. Lie on your abdomen with your legs straight. 8. Bend your left / right knee as far as you can without feeling pain. Keep your hips flat against the floor. 9. Hold this position for __________ seconds. 10. Slowly lower your leg to the starting position. Repeat __________ times. Complete this exercise __________ times a day.   Squats This exercise strengthens the muscles in front of your thigh and knee (quadriceps). 9. Stand in front of a table, with your feet and knees pointing straight ahead. You may  rest your hands on the table for balance but not for support. 10. Slowly bend your knees and lower your hips like you are going to sit in a chair. ? Keep your weight over your heels, not over your toes. ? Keep your lower legs upright so they are parallel with the table legs. ? Do not let your hips go lower than your knees. ? Do not bend lower than told by your health care provider. ? If your knee pain increases, do not bend as low. 11. Hold the squat position for __________ seconds. 12. Slowly push with your legs to return to standing. Do not use your hands to pull yourself to standing. Repeat __________ times. Complete this exercise __________ times a day. Wall slides This exercise strengthens the muscles in front of your thigh and knee (quadriceps). 9. Lean your back against a smooth  wall or door, and walk your feet out 18-24 inches (46-61 cm) from it. 10. Place your feet hip-width apart. 11. Slowly slide down the wall or door until your knees bend __________ degrees. Keep your knees over your heels, not over your toes. Keep your knees in line with your hips. 12. Hold this position for __________ seconds. Repeat __________ times. Complete this exercise __________ times a day.   Straight leg raises This exercise strengthens the muscles that rotate the leg at the hip and move it away from your body (hip abductors). 6. Lie on your side with your left / right leg in the top position. Lie so your head, shoulder, knee, and hip line up. You may bend your bottom knee to help you keep your balance. 7. Roll your hips slightly forward so your hips are stacked directly over each other and your left / right knee is facing forward. 8. Leading with your heel, lift your top leg 4-6 inches (10-15 cm). You should feel the muscles in your outer hip lifting. ? Do not let your foot drift forward. ? Do not let your knee roll toward the ceiling. 9. Hold this position for __________ seconds. 10. Slowly return your leg to the starting position. 11. Let your muscles relax completely after each repetition. Repeat __________ times. Complete this exercise __________ times a day.   Straight leg raises This exercise stretches the muscles that move your hips away from the front of the pelvis (hip extensors). 6. Lie on your abdomen on a firm surface. You can put a pillow under your hips if that is more comfortable. 7. Tense the muscles in your buttocks and lift your left / right leg about 4-6 inches (10-15 cm). Keep your knee straight as you lift your leg. 8. Hold this position for __________ seconds. 9. Slowly lower your leg to the starting position. 10. Let your leg relax completely after each repetition. Repeat __________ times. Complete this exercise __________ times a day. This information is not  intended to replace advice given to you by your health care provider. Make sure you discuss any questions you have with your health care provider. Document Revised: 08/15/2018 Document Reviewed: 08/15/2018 Elsevier Patient Education  2021 [employer].

## 2021-04-03 ENCOUNTER — Encounter: Payer: Self-pay | Admitting: *Deleted

## 2021-04-03 ENCOUNTER — Other Ambulatory Visit: Payer: Self-pay

## 2021-04-03 ENCOUNTER — Ambulatory Visit (INDEPENDENT_AMBULATORY_CARE_PROVIDER_SITE_OTHER): Payer: Medicaid Other | Admitting: Podiatry

## 2021-04-03 ENCOUNTER — Encounter: Payer: Self-pay | Admitting: Podiatry

## 2021-04-03 ENCOUNTER — Other Ambulatory Visit: Payer: Self-pay | Admitting: *Deleted

## 2021-04-03 DIAGNOSIS — E1140 Type 2 diabetes mellitus with diabetic neuropathy, unspecified: Secondary | ICD-10-CM

## 2021-04-03 DIAGNOSIS — M79674 Pain in right toe(s): Secondary | ICD-10-CM

## 2021-04-03 DIAGNOSIS — B351 Tinea unguium: Secondary | ICD-10-CM | POA: Diagnosis not present

## 2021-04-03 DIAGNOSIS — E1149 Type 2 diabetes mellitus with other diabetic neurological complication: Secondary | ICD-10-CM

## 2021-04-03 DIAGNOSIS — M79675 Pain in left toe(s): Secondary | ICD-10-CM

## 2021-04-03 DIAGNOSIS — M0579 Rheumatoid arthritis with rheumatoid factor of multiple sites without organ or systems involvement: Secondary | ICD-10-CM

## 2021-04-03 DIAGNOSIS — M0630 Rheumatoid nodule, unspecified site: Secondary | ICD-10-CM

## 2021-04-03 DIAGNOSIS — Z79899 Other long term (current) drug therapy: Secondary | ICD-10-CM

## 2021-04-03 LAB — COMPLETE METABOLIC PANEL WITH GFR
AG Ratio: 1.5 (calc) (ref 1.0–2.5)
ALT: 16 U/L (ref 6–29)
AST: 14 U/L (ref 10–35)
Albumin: 4.1 g/dL (ref 3.6–5.1)
Alkaline phosphatase (APISO): 88 U/L (ref 37–153)
BUN/Creatinine Ratio: 27 (calc) — ABNORMAL HIGH (ref 6–22)
BUN: 35 mg/dL — ABNORMAL HIGH (ref 7–25)
CO2: 24 mmol/L (ref 20–32)
Calcium: 10 mg/dL (ref 8.6–10.4)
Chloride: 102 mmol/L (ref 98–110)
Creat: 1.29 mg/dL — ABNORMAL HIGH (ref 0.50–0.99)
GFR, Est African American: 51 mL/min/{1.73_m2} — ABNORMAL LOW (ref >=60)
GFR, Est Non African American: 44 mL/min/{1.73_m2} — ABNORMAL LOW (ref >=60)
Globulin: 2.8 g/dL (calc) (ref 1.9–3.7)
Glucose, Bld: 220 mg/dL — ABNORMAL HIGH (ref 65–99)
Potassium: 4.7 mmol/L (ref 3.5–5.3)
Sodium: 137 mmol/L (ref 135–146)
Total Bilirubin: 0.4 mg/dL (ref 0.2–1.2)
Total Protein: 6.9 g/dL (ref 6.1–8.1)

## 2021-04-03 LAB — CBC WITH DIFFERENTIAL/PLATELET
Absolute Monocytes: 193 cells/uL — ABNORMAL LOW (ref 200–950)
Basophils Absolute: 42 cells/uL (ref 0–200)
Basophils Relative: 0.5 %
Eosinophils Absolute: 336 cells/uL (ref 15–500)
Eosinophils Relative: 4 %
HCT: 36.4 % (ref 35.0–45.0)
Hemoglobin: 11.7 g/dL (ref 11.7–15.5)
Lymphs Abs: 1252 cells/uL (ref 850–3900)
MCH: 27.1 pg (ref 27.0–33.0)
MCHC: 32.1 g/dL (ref 32.0–36.0)
MCV: 84.5 fL (ref 80.0–100.0)
MPV: 10.2 fL (ref 7.5–12.5)
Monocytes Relative: 2.3 %
Neutro Abs: 6577 cells/uL (ref 1500–7800)
Neutrophils Relative %: 78.3 %
Platelets: 394 10*3/uL (ref 140–400)
RBC: 4.31 10*6/uL (ref 3.80–5.10)
RDW: 14.8 % (ref 11.0–15.0)
Total Lymphocyte: 14.9 %
WBC: 8.4 10*3/uL (ref 3.8–10.8)

## 2021-04-03 LAB — VITAMIN D 25 HYDROXY (VIT D DEFICIENCY, FRACTURES): Vit D, 25-Hydroxy: 47 ng/mL (ref 30–100)

## 2021-04-03 NOTE — Progress Notes (Signed)
CBC is normal, glucose is elevated, creatinine is elevated, most likely due to the use of diuretics.  Please advise patient to decrease methotrexate to 7 tablets p.o. weekly.  Recheck BMP with GFR in 1 month.

## 2021-04-03 NOTE — Progress Notes (Signed)
Subjective: PYPER OLEXA is a pleasant 60 y.o. female patient seen today painful thick toenail right 3rd digit that is difficult to trim. Pain interferes with ambulation. Aggravating factors include wearing enclosed shoe gear. Pain is relieved with periodic professional debridement.  PCP is [name], MD.   She states her blood glucose was "180-something" this morning.  She is accompanied by her husband on today's visit. She notes no new problems on today's visit.  Allergies  Allergen Reactions  . Ace Inhibitors Cough  . Lisinopril Cough    Objective: Physical Exam  General: DULSE RUTAN is a pleasant 60 y.o. African American female, morbidly obese in NAD. AAO x 3.   Vascular:  Capillary fill time to digits <3 seconds b/l lower extremities. Palpable pedal pulses b/l LE. Pedal hair absent. Lower extremity skin temperature gradient within normal limits. No pain with calf compression b/l. No edema noted b/l lower extremities.  Dermatological:  Pedal skin with normal turgor, texture and tone bilaterally. No open wounds bilaterally. No interdigital macerations bilaterally. Toenails R 3rd toe elongated, discolored, dystrophic, thickened, and crumbly with subungual debris and tenderness to dorsal palpation.  Musculoskeletal:  Normal muscle strength 5/5 to all lower extremity muscle groups bilaterally. No pain crepitus or joint limitation noted with ROM b/l. No gross bony deformities bilaterally. Utilizes wheelchair for mobility assistance.  Neurological:  Protective sensation diminished with 10g monofilament b/l. Vibratory sensation diminished b/l.  Assessment and Plan:  1. Pain due to onychomycosis of toenails of both feet   2. Diabetic neuropathy with neurologic complication ([place])     -Examined patient. -Continue diabetic foot care principles. -Patient to continue soft, supportive shoe gear daily. -Toenails R 3rd toe debrided in length and girth without iatrogenic  bleeding with sterile nail nipper and dremel.  -Patient to report any pedal injuries to medical professional immediately. -Patient/POA to call should there be question/concern in the interim.  Return in about 3 months (around 07/04/2021).  Marzetta Board, DPM

## 2021-04-09 ENCOUNTER — Other Ambulatory Visit: Payer: Self-pay

## 2021-04-09 MED ORDER — ATORVASTATIN CALCIUM 20 MG PO TABS: 20.0000 mg | ORAL_TABLET | Freq: Every day | ORAL | 2 refills | Status: DC

## 2021-04-13 ENCOUNTER — Ambulatory Visit
Admission: RE | Admit: 2021-04-13 | Discharge: 2021-04-13 | Disposition: A | Discharge status: Home / Self Care | Payer: Medicaid Other | Source: Ambulatory Visit | Attending: Internal Medicine | Admitting: Internal Medicine

## 2021-04-13 ENCOUNTER — Other Ambulatory Visit: Payer: Self-pay

## 2021-04-13 DIAGNOSIS — Z1231 Encounter for screening mammogram for malignant neoplasm of breast: Secondary | ICD-10-CM

## 2021-04-15 ENCOUNTER — Ambulatory Visit: Payer: Medicaid Other | Admitting: Rheumatology

## 2021-04-16 ENCOUNTER — Ambulatory Visit
Admission: RE | Admit: 2021-04-16 | Discharge: 2021-04-16 | Disposition: A | Discharge status: Home / Self Care | Payer: Medicaid Other | Source: Ambulatory Visit | Attending: Physician Assistant | Admitting: Physician Assistant

## 2021-04-16 DIAGNOSIS — R1310 Dysphagia, unspecified: Secondary | ICD-10-CM

## 2021-04-17 ENCOUNTER — Ambulatory Visit (INDEPENDENT_AMBULATORY_CARE_PROVIDER_SITE_OTHER): Payer: Medicaid Other | Admitting: Orthopedic Surgery

## 2021-04-17 ENCOUNTER — Telehealth: Payer: Self-pay | Admitting: Radiology

## 2021-04-17 DIAGNOSIS — M170 Bilateral primary osteoarthritis of knee: Secondary | ICD-10-CM | POA: Diagnosis not present

## 2021-04-17 NOTE — Telephone Encounter (Signed)
Noted  

## 2021-04-17 NOTE — Telephone Encounter (Signed)
Could you please obtain authorization for bilateral knee gel injections? Thanks.

## 2021-04-18 ENCOUNTER — Encounter: Payer: Self-pay | Admitting: Orthopedic Surgery

## 2021-04-18 DIAGNOSIS — M170 Bilateral primary osteoarthritis of knee: Secondary | ICD-10-CM | POA: Diagnosis not present

## 2021-04-18 MED ORDER — METHYLPREDNISOLONE ACETATE 40 MG/ML IJ SUSP
40.0000 mg | INTRAMUSCULAR | Status: AC | PRN
  Administered 2021-04-18: 40 mg via INTRA_ARTICULAR

## 2021-04-18 MED ORDER — LIDOCAINE HCL 1 % IJ SOLN
5.0000 mL | INTRAMUSCULAR | Status: AC | PRN
  Administered 2021-04-18: 5 mL

## 2021-04-18 MED ORDER — BUPIVACAINE HCL 0.25 % IJ SOLN
4.0000 mL | INTRAMUSCULAR | Status: AC | PRN
  Administered 2021-04-18: 4 mL via INTRA_ARTICULAR

## 2021-04-18 NOTE — Progress Notes (Signed)
Office Visit Note   Patient: Kathleen Kane           Date of Birth: Mar 25, 1961           MRN: [id number] Visit Date: 04/17/2021 Requested by: Bo Merino, MD [place] [address] [place],  [place] [id number] PCP: [name], MD  Subjective: Chief Complaint  Patient presents with   Right Knee - Pain   Left Knee - Pain    HPI: Kathleen Kane is a 60 y.o. female who presents to the office complaining of bilateral knee pain and elbow nodules.  Patient has history of rheumatoid arthritis and sees Dr Estanislado Pandy.  She is on methotrexate for several years and has been on Orencia for about 1 year.  She complains of constant pain in her bilateral knees.  She is not able to stand and walk more than 5 minutes.  Pain wakes her up at night.  She has no history of prior knee surgery.  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  She would like to try knee injections today.  She does have history of diabetes.  Last A1c was 8.0.  She is rechecking her A1c on Monday.  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  She has had these for years and she feels they are unsightly and she does not like how they look.  She would like to have them removed and she is okay with having a scar instead of having the nodules.              ROS: All systems reviewed are negative as they relate to the chief complaint within the history of present illness.  Patient denies fevers or chills.  Assessment & Plan: Visit Diagnoses:  1. Bilateral primary osteoarthritis of knee     Plan: Patient is a 61 year old female who presents complaining of bilateral knee pain.  She has history of rheumatoid arthritis and moderate to severe arthritis of both knees that is worse in the right knee.  Due to history of diabetes with A1c 8.0, only 1 knee  can be injected today per her request.  Plan to aspirate and inject right knee.  She tolerated the procedure well.  We will preapproved her for bilateral knee gel injections in the future.  Additionally, she will follow-up in 10 days for left knee cortisone injection.  Regarding the rheumatoid nodules, plan for right elbow nodule excision first followed by left elbow.  Risk and benefits of the procedure discussed with the patient including not limited to infection nerve vessel damage potential recurrence of the nodule along with the cosmesis of the scar.  Patient understands risk benefits and wishes to proceed.  No evidence of active infection around either olecranon bursa region at this time..  This patient is diagnosed with osteoarthritis of the knee(s).    Radiographs show evidence of joint space narrowing, osteophytes, subchondral sclerosis and/or subchondral cysts.  This patient has knee pain which interferes with functional and activities of daily living.    This patient has experienced inadequate response, adverse effects and/or intolerance with conservative treatments such as acetaminophen, NSAIDS, topical creams, physical therapy or regular exercise, knee bracing and/or weight loss.   This patient has experienced inadequate response or has a contraindication to intra articular steroid injections for  at least 3 months.   This patient is not scheduled to have a total knee replacement within 6 months of starting treatment with viscosupplementation.   Follow-Up Instructions: No follow-ups on file.   Orders:  No orders of the defined types were placed in this encounter.  No orders of the defined types were placed in this encounter.     Procedures: Large Joint Inj: R knee on 04/18/2021 2:06 PM Indications: diagnostic evaluation, joint swelling and pain Details: 18 G 1.5 in needle, superolateral approach  Arthrogram: No  Medications: 5 mL lidocaine 1 %; 40 mg methylPREDNISolone  acetate 40 MG/ML; 4 mL bupivacaine 0.25 % Outcome: tolerated well, no immediate complications Procedure, treatment alternatives, risks and benefits explained, specific risks discussed. Consent was given by the patient. Immediately prior to procedure a time out was called to verify the correct patient, procedure, equipment, support staff and site/side marked as required. Patient was prepped and draped in the usual sterile fashion.    Correction to the medical record above.  Injections performed on 04/15/2021.  Clinical Data: No additional findings.  Objective: Vital Signs: LMP 10/28/2013   Physical Exam:  Constitutional: Patient appears well-developed HEENT:  Head: Normocephalic Eyes:EOM are normal Neck: Normal range of motion Cardiovascular: Normal rate Pulmonary/chest: Effort normal Neurologic: Patient is alert Skin: Skin is warm Psychiatric: Patient has normal mood and affect  Ortho Exam: Ortho exam demonstrates right knee with 10 degrees extension and 110 degrees of knee flexion.  Left knee with 5 degrees extension and 120 degrees of knee flexion.  Effusion present in both knees.  Medial and lateral joint line tenderness of both knees.  No pain with hip range of motion.  No calf tenderness.  Negative Homans' sign.  Able to form straight leg raise.  No ligamentous laxity noted.  Multiple rheumatoid nodules that are clumped together over both olecranon processes.  She also has what feel like rheumatoid nodules in the right dorsal proximal forearm.  Specialty Comments:  No specialty comments available.  Imaging: No results found.   PMFS History: Patient Active Problem List   Diagnosis Date Noted   Acute otalgia, right 11/26/2020   Arthralgia of right temporomandibular joint 11/26/2020   Bilateral impacted cerumen 11/26/2020   HCAP (healthcare-associated pneumonia) 10/02/2018   CHF exacerbation ([place]) 10/01/2018   Community acquired pneumonia 10/01/2018   OSA (obstructive  sleep apnea) 02/10/2017   Carpal tunnel syndrome, right 12/15/2016   Extensor tenosynovitis of wrist, right 12/15/2016   Bilateral primary osteoarthritis of knee 10/08/2015   Rheumatoid arthritis, 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[place]) 10/08/2015   Acute on chronic diastolic CHF (congestive heart failure) ([place]) 08/08/2015   Morbid obesity ([place]) 05/06/2015   Essential hypertension 04/10/2015   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   Primary osteoarthritis involving multiple joints 04/09/2015   Elevated uric acid in blood 01/23/2014   High risk medication use 01/23/2014   Numbness and tingling in left hand 10/01/2013   Tobacco abuse 10/01/2013   Elevated CA-125 02/06/2013   Pelvic mass in female 01/29/2013   Diabetes mellitus ([place]) 08/10/2012   Atrial fibrillation (HCC)    Chronic diastolic CHF (congestive heart failure) ([place])    Past Medical History:  Diagnosis Date   Acute on chronic diastolic CHF (congestive heart failure) ([place]) 08/08/2015   Anxiety    Atrial fibrillation, persistent (HCC)    Carpal tunnel syndrome, right 12/15/2016   CHF (congestive heart failure) ([place]) 2013   No echo found from that time, most likely diastolic  CHF exacerbation ([place]) 10/01/2018   Depression    Diabetes mellitus ([place]) 08/10/2012   Diabetes mellitus without complication ([place])    Hypertension    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 AHI-6.8/hr, during REM-27.3/hr. RDI-25.0/hr, during REM-40.9/hr. avg o2 sat during REM and NREM 95%   OSA (obstructive sleep apnea) 02/10/2017   Pelvic mass in female 01/29/2013   With ascites    Pneumonia 10/02/2018   Rheumatoid arthritis ([place])    Rheumatoid arthritis, 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[place]) 10/08/2015    Family History  Problem Relation Age of Onset   Hypertension Mother    Diabetes Mother    Throat cancer Mother     Hypertension Father    Diabetes Paternal Grandmother    Diabetes Sister    Heart Problems Sister    Kidney failure Sister    Hypertension Brother    Diabetes Brother    Healthy Son    Autoimmune disease Daughter     Past Surgical History:  Procedure Laterality Date   CARDIOVASCULAR STRESS TEST  02/16/2013   no significant EKG changes with Lexiscan, normal LV function and normal wall function   CESAREAN SECTION     x2   DOPPLER ECHOCARDIOGRAPHY  01/24/2008   EF 55-60%, no diagnostic evidence of LV wall motion abnormalities, LV wall thickness was mild-moderately increased.   LAPAROTOMY Right 02/27/2013   Procedure: EXPLORATORY LAPAROTOMY, right salpingo-oopherectomy;  Surgeon: [name], MD;  Location: [facility];  Service: Gynecology;  Laterality: Right;   left breast cyst     SALPINGOOPHORECTOMY Right 02/27/2013   Procedure: SALPINGO OOPHORECTOMY;  Surgeon: [name], MD;  Location: [facility];  Service: Gynecology;  Laterality: Right;   Social History   Occupational History   Not on file  Tobacco Use   Smoking status: Former    Packs/day: 0.75    Years: 24.00    Pack years: 18.00    Types: Cigarettes    Quit date: 06/08/2014    Years since quitting: 6.8   Smokeless tobacco: Never   Tobacco comments:    smoking cessation info given.   Vaping Use   Vaping Use: Former  Substance and Sexual Activity   Alcohol use: No    Alcohol/week: 0.0 standard drinks   Drug use: No   Sexual activity: Yes    Partners: Male

## 2021-04-21 ENCOUNTER — Telehealth: Payer: Self-pay

## 2021-04-21 NOTE — Telephone Encounter (Signed)
Tried calling patient concerning gel injection, but no answer and no Vm setup to leave a message.

## 2021-04-29 ENCOUNTER — Ambulatory Visit: Payer: Medicaid Other | Admitting: Orthopedic Surgery

## 2021-05-04 ENCOUNTER — Other Ambulatory Visit: Payer: Self-pay | Admitting: Physician Assistant

## 2021-05-04 NOTE — Telephone Encounter (Signed)
Next Visit: 07/02/2021  Last Visit: 04/02/2021  Last Fill: 01/26/2021  DX: Rheumatoid arthritis with rheumatoid factor of multiple sites without organ or systems involvement  Current Dose per office note on 04/02/2021: Methotrexate 20 mg weekly  Labs: 04/02/2021 CBC is normal, glucose is elevated, creatinine is elevated, most likely due to the use of diuretics.  Please advise patient to decrease methotrexate to 7tablets p.o. weekly.  Recheck BMP with GFR in 1 month.  Called patient and advised patient she is due to update BMP with GFR. Patient verbalized understanding and will update this week. Provided patient with lab hours.   Okay to refill methotrexate?

## 2021-05-08 NOTE — Telephone Encounter (Signed)
Unable to leave voice message.  Attempted to contact at [phone] on 05/08/2021.  Patient is call back

## 2021-05-13 DIAGNOSIS — I700 Atherosclerosis of aorta: Secondary | ICD-10-CM | POA: Insufficient documentation

## 2021-05-13 MED ORDER — APIXABAN 5 MG PO TABS: 5.0000 mg | ORAL_TABLET | Freq: Two times a day (BID) | ORAL | 5 refills | Status: DC

## 2021-05-13 NOTE — Telephone Encounter (Addendum)
   Name: Kathleen Kane  DOB: Jun 20, 1961  MRN: [id number]   Primary Cardiologist: Jenkins Rouge, MD  Chart reviewed as part of pre-operative protocol coverage. Patient was contacted 05/13/2021 in reference to pre-operative risk assessment for pending surgery as outlined below.  Kathleen Kane was last seen on 12/2020 by Dr. Johnsie Cancel with history outlined of persistent AF, chronic diastolic CHF, OSA, HTN, DM, RA, obesity, tobacco abuse, mild carotid stenosis, mild-moderate MR by echo 02/2020. At that [place] was felt to be doing well and f/u planned in 1 year with repeat echo at that time. Tried to call pt but got VM that is not set up yet. Sent MyChart msg to return our call. In meantime will route back to pharmD team to re-review to ensure recs still hold per Kristin's comments below.   Charlie Pitter, PA-C 05/13/2021, 10:31 AM

## 2021-05-13 NOTE — Telephone Encounter (Signed)
Pt last assessed 03/25/21, CHADS2VASc score was 4 at that time (CHF, HTN, DM, sex).  Pt does also have hx of aortic atherosclerosis noted on 09/2018 CT angio so her updated CHADS2VASC score is 5. I have added this to her problem list as well.  CHA2DS2-VASc Score = 5  his indicates a 7.2% annual risk of stroke. The patient's score is based upon: CHF History: Yes HTN History: Yes Diabetes History: Yes Stroke History: No Vascular Disease History: Yes Age Score: 0 Gender Score: 1   She has also had updated labs since last clearance rec. SCr has increased from 0.85 to 1.29, new CrCl is 80 using actual body weight and 53 using adjusted body weight due to morbid obesity. Plt stable at 394K.  Pt still at acceptable risk to hold Eliquis for 2 days prior to procedure. I will send in refill on med as well based on last fill history.

## 2021-05-13 NOTE — Addendum Note (Signed)
Addended by: Yatzil Clippinger E on: 05/13/2021 02:59 PM   Modules accepted: Orders

## 2021-05-15 NOTE — Telephone Encounter (Signed)
    Patient Name: Kathleen Kane  DOB: [birth year] MRN: [id number]  Primary Cardiologist: Jenkins Rouge, MD  Chart reviewed as part of pre-operative protocol coverage. I was able to reach the patient this time.   Given past medical history and time since last visit, based on ACC/AHA guidelines, Kathleen Kane would be at acceptable risk for the planned procedure without further cardiovascular testing. RCRI 0.9% indicating low risk of CV complications. The patient affirms she has been doing well without any new cardiac symptoms.  Therefore, based on ACC/AHA guidelines, the patient would be at acceptable risk for the planned procedure without further cardiovascular testing. The patient was advised that if she develops new symptoms prior to surgery to contact our office to arrange for a follow-up visit, and she verbalized understanding.  Per pharm D, patient at acceptable risk to hold Eliquis for 2 days prior to procedure.  I will route this recommendation to the requesting party via Epic fax function and remove from pre-op pool.  Please call with questions.  Charlie Pitter, PA-C 05/15/2021, 11:15 AM

## 2021-05-16 ENCOUNTER — Other Ambulatory Visit: Payer: Self-pay | Admitting: Physician Assistant

## 2021-05-18 NOTE — Telephone Encounter (Signed)
Pt called and would like a call back regarding the injection

## 2021-05-18 NOTE — Telephone Encounter (Signed)
Next Visit: 07/02/2021   Last Visit: 04/02/2021   Last Fill: 02/18/2021  DX: Rheumatoid arthritis with rheumatoid factor of multiple sites without organ or systems involvement   Current Dose per office note on 04/02/2021: prednisone 5 mg p.o. daily  Per protocol, okay to refill per Dr. Estanislado Pandy

## 2021-05-19 NOTE — Telephone Encounter (Signed)
Tried calling patient back concerning gel injection, but no answer and no VM setup to leave a message.

## 2021-05-28 DIAGNOSIS — M1712 Unilateral primary osteoarthritis, left knee: Secondary | ICD-10-CM | POA: Diagnosis not present

## 2021-06-03 ENCOUNTER — Ambulatory Visit (INDEPENDENT_AMBULATORY_CARE_PROVIDER_SITE_OTHER): Payer: Medicaid Other | Admitting: Orthopedic Surgery

## 2021-06-03 ENCOUNTER — Other Ambulatory Visit: Payer: Self-pay

## 2021-06-03 DIAGNOSIS — M0630 Rheumatoid nodule, unspecified site: Secondary | ICD-10-CM | POA: Diagnosis not present

## 2021-06-03 DIAGNOSIS — M1712 Unilateral primary osteoarthritis, left knee: Secondary | ICD-10-CM | POA: Diagnosis not present

## 2021-06-03 DIAGNOSIS — M170 Bilateral primary osteoarthritis of knee: Secondary | ICD-10-CM

## 2021-06-11 ENCOUNTER — Encounter: Payer: Self-pay | Admitting: Orthopedic Surgery

## 2021-06-11 MED ORDER — METHYLPREDNISOLONE ACETATE 40 MG/ML IJ SUSP
40.0000 mg | INTRAMUSCULAR | Status: AC | PRN
  Administered 2021-05-28: 40 mg via INTRA_ARTICULAR

## 2021-06-11 MED ORDER — LIDOCAINE HCL 1 % IJ SOLN
5.0000 mL | INTRAMUSCULAR | Status: AC | PRN
  Administered 2021-05-28: 5 mL

## 2021-06-11 MED ORDER — BUPIVACAINE HCL 0.25 % IJ SOLN
4.0000 mL | INTRAMUSCULAR | Status: AC | PRN
  Administered 2021-05-28: 4 mL via INTRA_ARTICULAR

## 2021-06-11 NOTE — Progress Notes (Signed)
Office Visit Note   Patient: Kathleen Kane           Date of Birth: June 08, 1961           MRN: [id number] Visit Date: 06/03/2021 Requested by: [name], MD [address] [place],  [place] [address] PCP: [name], MD  Subjective: Chief Complaint  Patient presents with   Left Knee - Pain    HPI: Kathleen Kane is a 60 year old patient with end-stage arthritis in both knees.  She had right knee injected and did well with that several weeks ago.  She is here to get the left knee injected.  She also has rheumatoid nodules in both elbows and she would like to have them excised.  She denies any numbness and tingling in the hands.              ROS: All systems reviewed are negative as they relate to the chief complaint within the history of present illness.  Patient denies  fevers or chills.   Assessment & Plan: Visit Diagnoses:  1. Bilateral primary osteoarthritis of knee   2. Rheumatoid nodules (HCC)     Plan: Impression is bilateral knee arthritis.  Left knee injected for planned today.  Patient would also like to have rheumatoid nodules excised in the right and left elbow region.  Would like to do that one at a time.  The rheumatoid nodules are large at 4 x 4 cm cumulative size.  No infection erythema or drainage present.  Elbow range of motion otherwise intact.  Risk and benefits of elbow nodule excision are discussed including not limited to infection nerve vessel damage and potential slow healing with hematoma formation.  Patient understands risk and benefits and would like to have them removed.  All questions answered.  Follow-Up Instructions: No follow-ups on file.   Orders:  No orders of the defined types were placed in this encounter.  No orders of the defined types were placed in this encounter.     Procedures: Large Joint Inj: L knee on 05/28/2021 12:19 PM Indications: diagnostic evaluation, joint swelling and pain Details: 18 G 1.5 in needle, superolateral  approach  Arthrogram: No  Medications: 5 mL lidocaine 1 %; 40 mg methylPREDNISolone acetate 40 MG/ML; 4 mL bupivacaine 0.25 % Outcome: tolerated well, no immediate complications Procedure, treatment alternatives, risks and benefits explained, specific risks discussed. Consent was given by the patient. Immediately prior to procedure a time out was called to verify the correct patient, procedure, equipment, support staff and site/side marked as required. Patient was prepped and draped in the usual sterile fashion.      Clinical Data: No additional findings.  Objective: Vital Signs: LMP 10/28/2013   Physical Exam:   Constitutional: Patient appears well-developed HEENT:  Head: Normocephalic Eyes:EOM are normal Neck: Normal range of motion Cardiovascular: Normal rate Pulmonary/chest: Effort normal Neurologic: Patient is alert Skin: Skin is warm Psychiatric: Patient has normal mood and affect   Ortho Exam: Ortho exam demonstrates full range of motion of both elbows.  She has nodule collection around both elbows.  Some would be accessible on the right-hand side through a single longitudinal incision measuring about 7 to 8 cm.  Motor sensory function hands intact.  Both knees are examined and she has unchanged range of motion and trace effusion in the left knee.  Extensor mechanism is intact.  No other masses lymphadenopathy or skin changes noted in that left knee region.  The right elbow nodules are mobile and  below the skin and consistent with rheumatoid nodules and not olecranon bursitis or gout.  Specialty Comments:  No specialty comments available.  Imaging: No results found.   PMFS History: Patient Active Problem List   Diagnosis Date Noted   Aortic atherosclerosis ([place]) 05/13/2021   Acute otalgia, right 11/26/2020   Arthralgia of right temporomandibular joint 11/26/2020   Bilateral impacted cerumen 11/26/2020   HCAP (healthcare-associated pneumonia) 10/02/2018   CHF  exacerbation ([place]) 10/01/2018   Community acquired pneumonia 10/01/2018   OSA (obstructive sleep apnea) 02/10/2017   Carpal tunnel syndrome, right 12/15/2016   Extensor tenosynovitis of wrist, right 12/15/2016   Bilateral primary osteoarthritis of knee 10/08/2015   Rheumatoid arthritis, 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[place]) 10/08/2015   Acute on chronic diastolic CHF (congestive heart failure) ([place]) 08/08/2015   Morbid obesity ([place]) 05/06/2015   Essential hypertension 04/10/2015   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   Primary osteoarthritis involving multiple joints 04/09/2015   Elevated uric acid in blood 01/23/2014   High risk medication use 01/23/2014   Numbness and tingling in left hand 10/01/2013   Tobacco abuse 10/01/2013   Elevated CA-125 02/06/2013   Pelvic mass in female 01/29/2013   Diabetes mellitus ([place]) 08/10/2012   Atrial fibrillation (HCC)    Chronic diastolic CHF (congestive heart failure) ([place])    Past Medical History:  Diagnosis Date   Acute on chronic diastolic CHF (congestive heart failure) ([place]) 08/08/2015   Anxiety    Atrial fibrillation, persistent (HCC)    Carpal tunnel syndrome, right 12/15/2016   CHF (congestive heart failure) ([place]) 2013   No echo found from that time, most likely diastolic   CHF exacerbation ([place]) 10/01/2018   Depression    Diabetes mellitus ([place]) 08/10/2012   Diabetes mellitus without complication ([place])    Hypertension    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 AHI-6.8/hr, during REM-27.3/hr. RDI-25.0/hr, during REM-40.9/hr. avg o2 sat during REM and NREM 95%   OSA (obstructive sleep apnea) 02/10/2017   Pelvic mass in female 01/29/2013   With ascites    Pneumonia 10/02/2018   Rheumatoid arthritis ([place])    Rheumatoid arthritis, 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[place]) 10/08/2015    Family History  Problem Relation  Age of Onset   Hypertension Mother    Diabetes Mother    Throat cancer Mother    Hypertension Father    Diabetes Paternal Grandmother    Diabetes Sister    Heart Problems Sister    Kidney failure Sister    Hypertension Brother    Diabetes Brother    Healthy Son    Autoimmune disease Daughter     Past Surgical History:  Procedure Laterality Date   CARDIOVASCULAR STRESS TEST  02/16/2013   no significant EKG changes with Lexiscan, normal LV function and normal wall function   CESAREAN SECTION     x2   DOPPLER ECHOCARDIOGRAPHY  01/24/2008   EF 55-60%, no diagnostic evidence of LV wall motion abnormalities, LV wall thickness was mild-moderately increased.   LAPAROTOMY Right 02/27/2013   Procedure: EXPLORATORY LAPAROTOMY, right salpingo-oopherectomy;  Surgeon: [name], MD;  Location: [facility];  Service: Gynecology;  Laterality: Right;   left breast cyst     SALPINGOOPHORECTOMY Right 02/27/2013   Procedure: SALPINGO OOPHORECTOMY;  Surgeon: [name], MD;  Location: [facility];  Service: Gynecology;  Laterality: Right;   Social History  Occupational History   Not on file  Tobacco Use   Smoking status: Former    Packs/day: 0.75    Years: 24.00    Pack years: 18.00    Types: Cigarettes    Quit date: 06/08/2014    Years since quitting: 7.0   Smokeless tobacco: Never   Tobacco comments:    smoking cessation info given.   Vaping Use   Vaping Use: Former  Substance and Sexual Activity   Alcohol use: No    Alcohol/week: 0.0 standard drinks   Drug use: No   Sexual activity: Yes    Partners: Male

## 2021-06-15 ENCOUNTER — Other Ambulatory Visit: Payer: Self-pay

## 2021-06-16 NOTE — Progress Notes (Signed)
Surgical Instructions    Your procedure is scheduled on Tuesday, August 16th, 2022.   Report to [facility] Main Entrance "A" at 14:00 A.M., then check in with the Admitting office.  Call this number if you have problems the morning of surgery:  [phone]   If you have any questions prior to your surgery date call [phone]: Open Monday-Friday 8am-4pm    Remember:  Do not eat after midnight the night before your surgery  You may drink clear liquids until 13:00 the day of your surgery.   Clear liquids allowed are: Water, Non-Citrus Juices (without pulp), Carbonated Beverages, Clear Tea, Black Coffee Only, and Gatorade  Patient Instructions   The day of surgery (if you have diabetes): Drink ONE (1) 12 oz G2 given to you in your pre admission testing appointment by 13:00 the day of surgery. Drink in one sitting. Do not sip.  This drink was given to you during your hospital  pre-op appointment visit.  Nothing else to drink after completing the  12 oz bottle of G2.         If you have questions, please contact your surgeon's office.     Take these medicines the morning of surgery with A SIP OF WATER:  allopurinol (ZYLOPRIM)  atorvastatin (LIPITOR) gabapentin (NEURONTIN)  metoprolol tartrate (LOPRESSOR) predniSONE (DELTASONE) tiZANidine (ZANAFLEX) - if needed  Follow your surgeon's instructions on when to stop Eliquis.  If no instructions were given by your surgeon then you will need to call the office to get those instructions.     Ask the doctor if you need to stop taking methotrexate ([place]) before surgery  As of today, STOP taking any Aspirin (unless otherwise instructed by your surgeon) Aleve, Naproxen, Ibuprofen, Motrin, Advil, Goody's, BC's, all herbal medications, fish oil, and all vitamins.  WHAT DO I DO ABOUT MY DIABETES MEDICATION?   Do not take metFORMIN (GLUCOPHAGE)  the morning of surgery.  THE NIGHT BEFORE SURGERY, take 28 units of NOVOLOG MIX  70/30 insulin.      THE MORNING OF SURGERY, do not take NOVOLOG MIX 70/30 insulin.   HOW TO MANAGE YOUR DIABETES BEFORE AND AFTER SURGERY  Why is it important to control my blood sugar before and after surgery? Improving blood sugar levels before and after surgery helps healing and can limit problems. A way of improving blood sugar control is eating a healthy diet by:  Eating less sugar and carbohydrates  Increasing activity/exercise  Talking with your doctor about reaching your blood sugar goals High blood sugars (greater than 180 mg/dL) can raise your risk of infections and slow your recovery, so you will need to focus on controlling your diabetes during the weeks before surgery. Make sure that the doctor who takes care of your diabetes knows about your planned surgery including the date and location.  How do I manage my blood sugar before surgery? Check your blood sugar at least 4 times a day, starting 2 days before surgery, to make sure that the level is not too high or low.  Check your blood sugar the morning of your surgery when you wake up and every 2 hours until you get to the Short Stay unit.  If your blood sugar is less than 70 mg/dL, you will need to treat for low blood sugar: Do not take insulin. Treat a low blood sugar (less than 70 mg/dL) with  cup of clear juice (cranberry or apple), 4 glucose tablets, OR glucose gel. Recheck blood sugar in  15 minutes after treatment (to make sure it is greater than 70 mg/dL). If your blood sugar is not greater than 70 mg/dL on recheck, call [phone] for further instructions. Report your blood sugar to the short stay nurse when you get to Short Stay.  If you are admitted to the hospital after surgery: Your blood sugar will be checked by the staff and you will probably be given insulin after surgery (instead of oral diabetes medicines) to make sure you have good blood sugar levels. The goal for blood sugar control after surgery is  80-180 mg/dL.           Do not wear jewelry or makeup Do not wear lotions, powders, perfumes, or deodorant. Do not shave 48 hours prior to surgery.   Do not bring valuables to the hospital. DO Not wear nail polish, gel polish, artificial nails, or any other type of covering on natural nails including finger and toenails. If patients have artificial nails, gel coating, etc. that need to be removed by a nail salon please have this removed prior to surgery or surgery may need to be canceled/delayed if the surgeon/ anesthesia feels like the patient is unable to be adequately monitored.             [facility] is not responsible for any belongings or valuables.  Do NOT Smoke (Tobacco/Vaping) or drink Alcohol 24 hours prior to your procedure If you use a CPAP at night, you may bring all equipment for your overnight stay.   Contacts, glasses, dentures or bridgework may not be worn into surgery, please bring cases for these belongings   For patients admitted to the hospital, discharge time will be determined by your treatment team.   Patients discharged the day of surgery will not be allowed to drive home, and someone needs to stay with them for 24 hours.  ONLY 1 SUPPORT PERSON MAY BE PRESENT WHILE YOU ARE IN SURGERY. IF YOU ARE TO BE ADMITTED ONCE YOU ARE IN YOUR ROOM YOU WILL BE ALLOWED TWO (2) VISITORS.  Minor children may have two parents present. Special consideration for safety and communication needs will be reviewed on a case by case basis.  Special instructions:    Oral Hygiene is also important to reduce your risk of infection.  Remember - BRUSH YOUR TEETH THE MORNING OF SURGERY WITH YOUR REGULAR TOOTHPASTE   [facility]- Preparing For Surgery  Before surgery, you can play an important role. Because skin is not sterile, your skin needs to be as free of germs as possible. You can reduce the number of germs on your skin by washing with CHG (chlorahexidine gluconate) Soap before surgery.   CHG is an antiseptic cleaner which kills germs and bonds with the skin to continue killing germs even after washing.     Please do not use if you have an allergy to CHG or antibacterial soaps. If your skin becomes reddened/irritated stop using the CHG.  Do not shave (including legs and underarms) for at least 48 hours prior to first CHG shower. It is OK to shave your face.  Please follow these instructions carefully.     Shower the NIGHT BEFORE SURGERY and the MORNING OF SURGERY with CHG Soap.   If you chose to wash your hair, wash your hair first as usual with your normal shampoo. After you shampoo, rinse your hair and body thoroughly to remove the shampoo.  Then [employer] and genitals (private parts) with your normal soap and  rinse thoroughly to remove soap.  After that Use CHG Soap as you would any other liquid soap. You can apply CHG directly to the skin and wash gently with a scrungie or a clean washcloth.   Apply the CHG Soap to your body ONLY FROM THE NECK DOWN.  Do not use on open wounds or open sores. Avoid contact with your eyes, ears, mouth and genitals (private parts). Wash Face and genitals (private parts)  with your normal soap.   Wash thoroughly, paying special attention to the area where your surgery will be performed.  Thoroughly rinse your body with warm water from the neck down.  DO NOT shower/wash with your normal soap after using and rinsing off the CHG Soap.  Pat yourself dry with a CLEAN TOWEL.  Wear CLEAN PAJAMAS to bed the night before surgery  Place CLEAN SHEETS on your bed the night before your surgery  DO NOT SLEEP WITH PETS.   Day of Surgery:  Take a shower with CHG soap. Wear Clean/Comfortable clothing the morning of surgery Do not apply any deodorants/lotions.   Remember to brush your teeth WITH YOUR REGULAR TOOTHPASTE.   Please read over the following fact sheets that you were given.

## 2021-06-17 ENCOUNTER — Other Ambulatory Visit: Payer: Self-pay

## 2021-06-17 ENCOUNTER — Encounter (HOSPITAL_COMMUNITY): Payer: Self-pay

## 2021-06-17 ENCOUNTER — Encounter (HOSPITAL_COMMUNITY)
Admission: RE | Admit: 2021-06-17 | Discharge: 2021-06-17 | Disposition: A | Discharge status: Home / Self Care | Payer: Medicaid Other | Source: Ambulatory Visit | Attending: Orthopedic Surgery | Admitting: Orthopedic Surgery

## 2021-06-17 DIAGNOSIS — I4891 Unspecified atrial fibrillation: Secondary | ICD-10-CM | POA: Diagnosis not present

## 2021-06-17 DIAGNOSIS — Z01818 Encounter for other preprocedural examination: Secondary | ICD-10-CM | POA: Insufficient documentation

## 2021-06-17 LAB — HEMOGLOBIN A1C
Hgb A1c MFr Bld: 9.2 % — ABNORMAL HIGH (ref 4.8–5.6)
Mean Plasma Glucose: 217.34 mg/dL

## 2021-06-17 LAB — CBC
HCT: 33.9 % — ABNORMAL LOW (ref 36.0–46.0)
Hemoglobin: 10.7 g/dL — ABNORMAL LOW (ref 12.0–15.0)
MCH: 28.7 pg (ref 26.0–34.0)
MCHC: 31.6 g/dL (ref 30.0–36.0)
MCV: 90.9 fL (ref 80.0–100.0)
Platelets: 268 10*3/uL (ref 150–400)
RBC: 3.73 MIL/uL — ABNORMAL LOW (ref 3.87–5.11)
RDW: 15.6 % — ABNORMAL HIGH (ref 11.5–15.5)
WBC: 5.8 10*3/uL (ref 4.0–10.5)
nRBC: 0 % (ref 0.0–0.2)

## 2021-06-17 LAB — GLUCOSE, CAPILLARY: Glucose-Capillary: 223 mg/dL — ABNORMAL HIGH (ref 70–99)

## 2021-06-17 LAB — BASIC METABOLIC PANEL
Anion gap: 9 (ref 5–15)
BUN: 11 mg/dL (ref 8–23)
CO2: 21 mmol/L — ABNORMAL LOW (ref 22–32)
Calcium: 8.9 mg/dL (ref 8.9–10.3)
Chloride: 108 mmol/L (ref 98–111)
Creatinine, Ser: 0.98 mg/dL (ref 0.44–1.00)
GFR, Estimated: >60 mL/min (ref >60)
Glucose, Bld: 203 mg/dL — ABNORMAL HIGH (ref 70–99)
Potassium: 3.5 mmol/L (ref 3.5–5.1)
Sodium: 138 mmol/L (ref 135–145)

## 2021-06-17 NOTE — Progress Notes (Signed)
PCP - [name], MD Cardiologist - Melina Copa, PA-C  PPM/ICD - denies  Chest x-ray - 12/29/2018 EKG - 06/17/2021 (At PAT appt) Pt A-fib at baseline Stress Test - 02/16/2013 ECHO - 02/26/2020 (55-60%) Cardiac Cath - denies  Sleep Study - 10/18/2008 CPAP - denies  Fasting Blood Sugar - 102 Checks Blood Sugar 3 times a day  Blood Thinner Instructions: Pt instructed to hold Eliquis 2 days prior. Stop taking on 06/21/21.   ERAS Protcol - yes PRE-SURGERY G2  COVID TEST- not applicable, ambulatory surgery   Anesthesia review: yes, HgbA1c is 9.2 at PAT appt and pt has cardiac hx  Patient denies shortness of breath, fever, cough and chest pain at PAT appointment   All instructions explained to the patient, with a verbal understanding of the material. Patient agrees to go over the instructions while at home for a better understanding. The opportunity to ask questions was provided.

## 2021-06-18 ENCOUNTER — Encounter (HOSPITAL_COMMUNITY): Payer: Self-pay | Admitting: Vascular Surgery

## 2021-06-18 NOTE — Progress Notes (Signed)
Anesthesia Chart Review:  Case: [id number] Date/Time: 06/23/21 1547   Procedure: right elbow nodule excision (Right)   Anesthesia type: General   Pre-op diagnosis: right elbow nodule   Location: [facility] OR ROOM 06 / [place] OR   Surgeons: Meredith Pel, MD       DISCUSSION: Patient is a 60 year old female scheduled for the above procedure.  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 She had evidence of right TMJ dysfunction per 11/26/20 ENT evaluation by Jerrell Belfast, MD.   Last cardiology visit with Dr. Johnsie Cancel 12/09/20. Preoperative cardiology input outlined by Melina Copa, PA-c on 05/15/21, "Given past medical history and time since last visit, based on ACC/AHA guidelines, Kathleen Kane would be at acceptable risk for the planned procedure without further cardiovascular testing. RCRI 0.9% indicating low risk of CV complications. The patient affirms she has been doing well without any new cardiac symptoms.  Therefore, based on ACC/AHA guidelines, the patient would be at acceptable risk for the planned procedure without further cardiovascular testing.Marland KitchenMarland KitchenPer pharm D, patient at acceptable risk to hold Eliquis for 2 days prior to procedure."  She reported a home fasting CBG of 102. A1c 9.2%, up from 8.0% last year. Will route result to Dr. Marlou Sa. She will get a CBG on arrival for surgery. She is on Novolg 70/30 40 Units BID, metformin 1000 mg BID.  RA regimen include prednisone 5 mg daily, methotrexate weekly, and Abatacept.  Anesthesia team to evaluate him for surgery.   VS: BP 134/62   Pulse 88   Temp 37.1 C (Oral)   Resp 19   Ht $R'5\' 2"'Al$  (1.575 m)   Wt 116.3 kg   LMP 10/28/2013   SpO2 97%    BMI 46.90 kg/m   PROVIDERS: [name], MD is PCP  Jenkins Rouge, MD is cardiologist  Lorenza Cambridge, MD is rheumatologist   LABS: Preoperative labs noted. A1c 9.2%, routed to Dr. Marlou Sa.  (all labs ordered are listed, but only abnormal results are displayed)  Labs Reviewed  GLUCOSE, CAPILLARY - Abnormal; Notable for the following components:      Result Value   Glucose-Capillary 223 (*)    All other components within normal limits  CBC - Abnormal; Notable for the following components:   RBC 3.73 (*)    Hemoglobin 10.7 (*)    HCT 33.9 (*)    RDW 15.6 (*)    All other components within normal limits  BASIC METABOLIC PANEL - Abnormal; Notable for the following components:   CO2 21 (*)    Glucose, Bld 203 (*)    All other components within normal limits  HEMOGLOBIN A1C - Abnormal; Notable for the following components:   Hgb A1c MFr Bld 9.2 (*)    All other components within normal limits     EKG: 06/17/21: Atrial fibrillation at 83 bpm Nonspecific T wave abnormality Abnormal ECG No significant change since last tracing Confirmed by [place], Peter [phone]) on 06/17/2021 4:57:24 PM   CV: Echo 02/26/20: IMPRESSIONS   1. Left ventricular ejection fraction, by estimation, is 55 to 60%. The  left ventricle has normal function. The left ventricle has no regional  wall motion abnormalities. There is mild concentric left ventricular  hypertrophy. Left ventricular diastolic  function could not be evaluated.   2. Right ventricular systolic function is normal. The right ventricular  size is normal. There is normal pulmonary artery systolic pressure. The  estimated right ventricular systolic pressure is 09.9 mmHg.   3. Left atrial size was mildly dilated.   4. Restricted PMVL with posteriorly directed jet. Mild to moderate MR.  The mitral valve is grossly normal. Mild to moderate mitral valve  regurgitation.   5. The aortic valve is tricuspid. Aortic valve regurgitation is not   visualized. No aortic stenosis is present.   6. The inferior vena cava is normal in size with greater than 50%  respiratory variability, suggesting right atrial pressure of 3 mmHg.  - Comparison(s): No significant change from prior study. EF 55-60%. Mild to  moderate MR.    US Carotid 02/15/20: Summary:  - Right Carotid: Velocities in the right ICA are consistent with a 1-39% stenosis. The RICA velocities remain within normal range and stable compared to the prior exam.  - Left Carotid: Velocities in the left ICA are consistent with a 1-39% stenosis. Non-hemodynamically significant plaque <50% noted in the CCA. The LICA velocities remain within normal range and have increased compared to the prior exam.  - Vertebrals:  Bilateral vertebral arteries demonstrate antegrade flow.  - Subclavians: Left subclavian artery flow was disturbed. Normal flow hemodynamics were seen in the right subclavian artery.    Nuclear stress test 02/16/13: Overall Impression:  Normal stress nuclear study.  Low risk stress nuclear study.   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 AHI-6.8/hr, during REM-27.3/hr. RDI-25.0/hr, during REM-40.9/hr. avg o2 sat during REM and NREM 95%   OSA (obstructive sleep apnea) 02/10/2017   Pelvic mass in female 01/29/2013   With ascites    Pneumonia 10/02/2018   Rheumatoid arthritis ([place])     Rheumatoid arthritis, 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[place]) 10/08/2015    Past Surgical History:  Procedure Laterality Date   CARDIOVASCULAR STRESS TEST  02/16/2013   no significant EKG changes with Lexiscan, normal LV function and normal wall function   CESAREAN SECTION     x2   DOPPLER ECHOCARDIOGRAPHY  01/24/2008   EF 55-60%, no diagnostic evidence of LV wall motion abnormalities, LV wall thickness was mild-moderately increased.   HAND SURGERY Right 2018   LAPAROTOMY Right 02/27/2013   Procedure: EXPLORATORY LAPAROTOMY, right salpingo-oopherectomy;  Surgeon: [name], MD;  Location: [facility] ORS;  Service: Gynecology;  Laterality: Right;   left breast cyst     SALPINGOOPHORECTOMY Right 02/27/2013   Procedure: SALPINGO OOPHORECTOMY;  Surgeon: [name], MD;  Location: [facility] ORS;  Service: Gynecology;  Laterality: Right;   TUBAL LIGATION Bilateral 1989    MEDICATIONS:  Abatacept 125 MG/ML SOAJ   Accu-Chek FastClix Lancets MISC   ACCU-CHEK GUIDE test strip   allopurinol (ZYLOPRIM) 100 MG tablet   apixaban (ELIQUIS) 5 MG TABS tablet  atorvastatin (LIPITOR) 20 MG tablet   B-D ULTRAFINE III SHORT PEN 31G X 8 MM MISC   blood glucose meter kit and supplies   diclofenac sodium (VOLTAREN) 1 % GEL   folic acid (FOLVITE) 1 MG tablet   furosemide (LASIX) 40 MG tablet   gabapentin (NEURONTIN) 300 MG capsule   ibuprofen (ADVIL) 800 MG tablet   losartan (COZAAR) 25 MG tablet   metFORMIN (GLUCOPHAGE) 500 MG tablet   methotrexate (RHEUMATREX) 2.5 MG tablet   metoprolol tartrate (LOPRESSOR) 25 MG tablet   NOVOLOG MIX 70/30 FLEXPEN (70-30) 100 UNIT/ML FlexPen   predniSONE (DELTASONE) 5 MG tablet   tiZANidine (ZANAFLEX) 4 MG tablet   No current facility-administered medications for this encounter.    Myra Gianotti, PA-C Surgical Short Stay/Anesthesiology [facility] Phone [phone] [facility] Phone [phone] 06/18/2021 11:17 AM

## 2021-06-18 NOTE — Anesthesia Preprocedure Evaluation (Deleted)
Anesthesia Evaluation    Airway        Dental   Pulmonary former smoker,           Cardiovascular hypertension,      Neuro/Psych    GI/Hepatic   Endo/Other  diabetes  Renal/GU      Musculoskeletal   Abdominal   Peds  Hematology   Anesthesia Other Findings   Reproductive/Obstetrics                             Anesthesia Physical Anesthesia Plan  ASA:   Anesthesia Plan:    Post-op Pain Management:    Induction:   PONV Risk Score and Plan:   Airway Management Planned:   Additional Equipment:   Intra-op Plan:   Post-operative Plan:   Informed Consent:   Plan Discussed with:   Anesthesia Plan Comments: (PAT note written 06/18/2021 by Myra Gianotti, PA-C. )        Anesthesia Quick Evaluation

## 2021-06-19 NOTE — Progress Notes (Deleted)
Office Visit Note  Patient: Kathleen Kane             Date of Birth: Sep 03, 1961           MRN: [id number]             PCP: [name], MD Referring: [name], MD Visit Date: 07/02/2021 Occupation: '@GUAROCC'$ @  Subjective:    History of Present Illness: Kathleen Kane is a 60 y.o. female with history of seropositive rheumatoid arthritis, osteoarthritis, and gout.  Patient is currently on Orencia 125 mg subcutaneous injections once weekly, methotrexate 7 tablets by mouth once weekly, folic acid 2 mg by mouth daily, and prednisone 5 mg 1 tablet by mouth daily.  TB Gold negative on 01/12/2021.  CBC and BMP updated on 06/17/2021.    Activities of Daily Living:  Patient reports morning stiffness for *** {minute/hour:19697}.   Patient {ACTIONS;DENIES/REPORTS:21021675::"Denies"} nocturnal pain.  Difficulty dressing/grooming: {ACTIONS;DENIES/REPORTS:21021675::"Denies"} Difficulty climbing stairs: {ACTIONS;DENIES/REPORTS:21021675::"Denies"} Difficulty getting out of chair: {ACTIONS;DENIES/REPORTS:21021675::"Denies"} Difficulty using hands for taps, buttons, cutlery, and/or writing: {ACTIONS;DENIES/REPORTS:21021675::"Denies"}  No Rheumatology ROS completed.   PMFS History:  Patient Active Problem List   Diagnosis Date Noted   Aortic atherosclerosis ([place]) 05/13/2021   Acute otalgia, right 11/26/2020   Arthralgia of right temporomandibular joint 11/26/2020   Bilateral impacted cerumen 11/26/2020   HCAP (healthcare-associated pneumonia) 10/02/2018   CHF exacerbation ([place]) 10/01/2018   Community acquired pneumonia 10/01/2018   OSA (obstructive sleep apnea) 02/10/2017   Carpal tunnel syndrome, right 12/15/2016   Extensor tenosynovitis of wrist, right 12/15/2016   Bilateral primary osteoarthritis of knee 10/08/2015   Rheumatoid arthritis, 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[place]) 10/08/2015   Acute on chronic diastolic CHF (congestive heart failure) ([place]) 08/08/2015   Morbid obesity ([place]) 05/06/2015    Essential hypertension 04/10/2015   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   Primary osteoarthritis involving multiple joints 04/09/2015   Elevated uric acid in blood 01/23/2014   High risk medication use 01/23/2014   Numbness and tingling in left hand 10/01/2013   Tobacco abuse 10/01/2013   Elevated CA-125 02/06/2013   Pelvic mass in female 01/29/2013   Diabetes mellitus ([place]) 08/10/2012   Atrial fibrillation ([facility])    Chronic diastolic CHF (congestive heart failure) ([place])     Past Medical History:  Diagnosis Date   Acute on chronic diastolic CHF (congestive heart failure) ([place]) 08/08/2015   Anxiety    Atrial fibrillation, persistent ([place])    Carpal tunnel syndrome, right 12/15/2016   CHF (congestive heart failure) ([place]) 2013   No echo found from that time, most likely diastolic   CHF exacerbation ([place]) 10/01/2018   Depression    Diabetes mellitus ([place]) 08/10/2012   Diabetes mellitus without complication ([place])    Hypertension    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   Morbid obesity ([place]) 05/06/2015   Obesity    Obstructive sleep apnea    Sleep study performed 10/18/2008. AHI-6.8/hr, during REM-27.3/hr. RDI-25.0/hr, during REM-40.9/hr. avg o2 sat during REM and NREM 95%   OSA (obstructive sleep apnea) 02/10/2017   Pelvic mass in female 01/29/2013   With ascites    Pneumonia 10/02/2018   Rheumatoid arthritis ([place])    Rheumatoid arthritis, 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[place]) 10/08/2015    Family History  Problem Relation Age of Onset   Hypertension Mother    Diabetes Mother    Throat cancer Mother    Hypertension Father    Diabetes Paternal Grandmother    Diabetes Sister  Heart Problems Sister    Kidney failure Sister    Hypertension Brother    Diabetes Brother    Healthy Son    Autoimmune disease Daughter    Past Surgical History:  Procedure Laterality Date   CARDIOVASCULAR STRESS TEST  02/16/2013   no significant EKG  changes with Lexiscan, normal LV function and normal wall function   CESAREAN SECTION     x2   DOPPLER ECHOCARDIOGRAPHY  01/24/2008   EF 55-60%, no diagnostic evidence of LV wall motion abnormalities, LV wall thickness was mild-moderately increased.   HAND SURGERY Right 2018   LAPAROTOMY Right 02/27/2013   Procedure: EXPLORATORY LAPAROTOMY, right salpingo-oopherectomy;  Surgeon: [name], MD;  Location: [facility];  Service: Gynecology;  Laterality: Right;   left breast cyst     SALPINGOOPHORECTOMY Right 02/27/2013   Procedure: SALPINGO OOPHORECTOMY;  Surgeon: [name], MD;  Location: [facility];  Service: Gynecology;  Laterality: Right;   TUBAL LIGATION Bilateral 1989   Social History   Social History Narrative   Not on file    There is no immunization history on file for this patient.   Objective: Vital Signs: LMP 10/28/2013    Physical Exam Vitals and nursing note reviewed.  Constitutional:      Appearance: She is well-developed.  HENT:     Head: Normocephalic and atraumatic.  Eyes:     Conjunctiva/sclera: Conjunctivae normal.  Pulmonary:     Effort: Pulmonary effort is normal.  Abdominal:     Palpations: Abdomen is soft.  Musculoskeletal:     Cervical back: Normal range of motion.  Skin:    General: Skin is warm and dry.     Capillary Refill: Capillary refill takes less than 2 seconds.  Neurological:     Mental Status: She is alert and oriented to person, place, and time.  Psychiatric:        Behavior: Behavior normal.     Musculoskeletal Exam: ***  CDAI Exam: CDAI Score: -- Patient Global: --; Provider Global: -- Swollen: --; Tender: -- Joint Exam 07/02/2021   No joint exam has been documented for this visit   There is currently no information documented on the homunculus. Go to the Rheumatology activity and complete the homunculus joint exam.  Investigation: No additional findings.  Imaging: No results found.  Recent Labs: Lab Results   Component Value Date   WBC 5.8 06/17/2021   HGB 10.7 (L) 06/17/2021   PLT 268 06/17/2021   NA 138 06/17/2021   K 3.5 06/17/2021   CL 108 06/17/2021   CO2 21 (L) 06/17/2021   GLUCOSE 203 (H) 06/17/2021   BUN 11 06/17/2021   CREATININE 0.98 06/17/2021   BILITOT 0.4 04/02/2021   ALKPHOS 105 07/21/2020   AST 14 04/02/2021   ALT 16 04/02/2021   PROT 6.9 04/02/2021   ALBUMIN 4.0 07/21/2020   CALCIUM 8.9 06/17/2021   GFRAA 51 (L) 04/02/2021   QFTBGOLDPLUS NEGATIVE 01/12/2021    Speciality Comments: dxd 2015 at [place].  An inadequate response to Plaquenil, Humira, Remicade.  On methotrexate and prednisone since 2015.  Orencia added May 2021.  Prednisone 5 mg p.o. daily  Procedures:  No procedures performed Allergies: Ace inhibitors and Lisinopril   Assessment / Plan:     Visit Diagnoses: No diagnosis found.  Orders: No orders of the defined types were placed in this encounter.  No orders of the defined types were placed in this encounter.   Face-to-face time spent with patient was ***  minutes. Greater than 50% of time was spent in counseling and coordination of care.  Follow-Up Instructions: No follow-ups on file.   Earnestine Mealing, CMA  Note - This record has been created using Editor, commissioning.  Chart creation errors have been sought, but may not always  have been located. Such creation errors do not reflect on  the standard of medical care.

## 2021-06-23 ENCOUNTER — Ambulatory Visit (HOSPITAL_COMMUNITY): Admission: RE | Admit: 2021-06-23 | Payer: Medicaid Other | Source: Home / Self Care | Admitting: Orthopedic Surgery

## 2021-06-23 ENCOUNTER — Encounter (HOSPITAL_COMMUNITY): Admission: RE | Payer: Self-pay | Source: Home / Self Care

## 2021-06-23 SURGERY — EXCISION, MASS, UPPER EXTREMITY
Anesthesia: General | Laterality: Right

## 2021-07-01 ENCOUNTER — Encounter: Payer: Medicaid Other | Admitting: Orthopedic Surgery

## 2021-07-02 ENCOUNTER — Ambulatory Visit: Payer: Medicaid Other | Admitting: Physician Assistant

## 2021-07-02 DIAGNOSIS — Z7952 Long term (current) use of systemic steroids: Secondary | ICD-10-CM

## 2021-07-02 DIAGNOSIS — M79641 Pain in right hand: Secondary | ICD-10-CM

## 2021-07-02 DIAGNOSIS — M170 Bilateral primary osteoarthritis of knee: Secondary | ICD-10-CM

## 2021-07-02 DIAGNOSIS — Z8701 Personal history of pneumonia (recurrent): Secondary | ICD-10-CM

## 2021-07-02 DIAGNOSIS — G4733 Obstructive sleep apnea (adult) (pediatric): Secondary | ICD-10-CM

## 2021-07-02 DIAGNOSIS — I10 Essential (primary) hypertension: Secondary | ICD-10-CM

## 2021-07-02 DIAGNOSIS — Z79899 Other long term (current) drug therapy: Secondary | ICD-10-CM

## 2021-07-02 DIAGNOSIS — E559 Vitamin D deficiency, unspecified: Secondary | ICD-10-CM

## 2021-07-02 DIAGNOSIS — M0630 Rheumatoid nodule, unspecified site: Secondary | ICD-10-CM

## 2021-07-02 DIAGNOSIS — E1165 Type 2 diabetes mellitus with hyperglycemia: Secondary | ICD-10-CM

## 2021-07-02 DIAGNOSIS — R042 Hemoptysis: Secondary | ICD-10-CM

## 2021-07-02 DIAGNOSIS — I482 Chronic atrial fibrillation, unspecified: Secondary | ICD-10-CM

## 2021-07-02 DIAGNOSIS — Z87891 Personal history of nicotine dependence: Secondary | ICD-10-CM

## 2021-07-02 DIAGNOSIS — I5032 Chronic diastolic (congestive) heart failure: Secondary | ICD-10-CM

## 2021-07-02 DIAGNOSIS — M0579 Rheumatoid arthritis with rheumatoid factor of multiple sites without organ or systems involvement: Secondary | ICD-10-CM

## 2021-07-02 DIAGNOSIS — M79671 Pain in right foot: Secondary | ICD-10-CM

## 2021-07-02 DIAGNOSIS — M1A09X1 Idiopathic chronic gout, multiple sites, with tophus (tophi): Secondary | ICD-10-CM

## 2021-07-10 ENCOUNTER — Encounter: Payer: Self-pay | Admitting: Podiatry

## 2021-07-10 ENCOUNTER — Other Ambulatory Visit: Payer: Self-pay

## 2021-07-10 ENCOUNTER — Ambulatory Visit: Payer: Medicaid Other | Admitting: Podiatry

## 2021-07-10 DIAGNOSIS — E1149 Type 2 diabetes mellitus with other diabetic neurological complication: Secondary | ICD-10-CM

## 2021-07-10 DIAGNOSIS — B351 Tinea unguium: Secondary | ICD-10-CM | POA: Diagnosis not present

## 2021-07-10 DIAGNOSIS — M79675 Pain in left toe(s): Secondary | ICD-10-CM

## 2021-07-10 DIAGNOSIS — M79674 Pain in right toe(s): Secondary | ICD-10-CM

## 2021-07-10 DIAGNOSIS — E1140 Type 2 diabetes mellitus with diabetic neuropathy, unspecified: Secondary | ICD-10-CM | POA: Diagnosis not present

## 2021-07-14 NOTE — Progress Notes (Signed)
  Subjective:  Patient ID: Kathleen Kane, female    DOB: 02-Aug-1961,  MRN: [id number]  60 y.o. female presents with at risk foot care with history of diabetic neuropathy and painful thick toenails that are difficult to trim. Pain interferes with ambulation. Aggravating factors include wearing enclosed shoe gear. Pain is relieved with periodic professional debridement..    Patient's blood sugar was 215 mg/dl yesterday.  Last A1c was 9.2%.  PCP: [name], MD and last visit was: last month.  Review of Systems: Negative except as noted in the HPI.   Allergies  Allergen Reactions   Ace Inhibitors Cough   Lisinopril Cough    Objective:  There were no vitals filed for this visit. Constitutional Patient is a pleasant 60 y.o. African American female in NAD. AAO x 3.  Vascular Capillary fill time to digits <3 seconds b/l lower extremities. [place] DP/PT pulse(s) b/l lower extremities. Pedal hair absent. Lower extremity skin temperature gradient within normal limits. No pain with calf compression b/l. No edema noted b/l lower extremities. No cyanosis or clubbing noted.  Neurologic Normal speech. Protective sensation diminished bilaterally with 10g monofilament b/l.   Dermatologic Pedal skin with normal turgor, texture and tone b/l lower extremities. No open wounds b/l lower extremities. No interdigital macerations b/l lower extremities. Toenail right 3rd digit toenail elongated, discolored, dystrophic, thickened, crumbly with subungual debris and tenderness to dorsal palpation. Anonychia 1-5 left, right 1, 2, 4, 5 secondary to matrixectomy.  Orthopedic: Normal muscle strength 5/5 to all lower extremity muscle groups bilaterally. Patient ambulates independent of any assistive aids. Utilizes wheelchair for mobility assistance.   Hemoglobin A1C Latest Ref Rng & Units 06/17/2021  HGBA1C 4.8 - 5.6 % 9.2(H)  Some recent data might be hidden   Assessment:   1. Pain due to onychomycosis of  toenails of both feet   2. Diabetic neuropathy with neurologic complication [facility])    Plan:  Patient was evaluated and treated and all questions answered. Consent given for treatment as described below: -Examined patient. -Continue diabetic foot care principles: inspect feet daily, monitor glucose as recommended by PCP and/or Endocrinologist, and follow prescribed diet per PCP, Endocrinologist and/or dietician. -Patient to continue soft, supportive shoe gear daily. -Toenails R 3rd toe debrided in length and girth without iatrogenic bleeding with sterile nail nipper and dremel.  -Patient to report any pedal injuries to medical professional immediately. -Patient/POA to call should there be question/concern in the interim.  Return in about 3 months (around 10/09/2021).  Marzetta Board, DPM

## 2021-07-20 ENCOUNTER — Other Ambulatory Visit: Payer: Self-pay | Admitting: Rheumatology

## 2021-07-20 DIAGNOSIS — M059 Rheumatoid arthritis with rheumatoid factor, unspecified: Secondary | ICD-10-CM

## 2021-07-20 NOTE — Telephone Encounter (Signed)
Next Visit: patient is due for a follow up, I attempted to contact patient and no answer/no voicemail set up.   Last Visit: 04/02/2021  Last Fill: 03/24/2021  [id number] arthritis with rheumatoid factor of multiple sites without organ or systems involvement  Current Dose per office note on 04/02/2021: Orencia 125 mg subcu weekly   Labs: 06/17/2021 CBC & BMP CO2 21, Glucose 203, RBC 3.73, hemoglobin 10.7, HCT 33.9.  TB Gold: 01/12/2021 negative    Okay to refill 30 day supply of orencia?

## 2021-07-21 NOTE — Progress Notes (Signed)
Office Visit Note  Patient: Kathleen Kane             Date of Birth: 10-18-1961           MRN: [id number]             PCP: [name], MD Referring: [name], MD Visit Date: 07/22/2021 Occupation: '@GUAROCC'$ @  Subjective:  Pain in multiple joints  History of Present Illness: Kathleen Kane is a 60 y.o. female with history of seropositive rheumatoid arthritis.  She is currently on orencia 125 mg sq injections once weekly, methotrexate 7 tablets once weekly, folic acid 2 mg daily, and prednisone 5 mg daily.  She is unable to take NSAIDs due to elevation in creatinine and taking eliquis.  She continues to have chronic pain in multiple joints.  She has intermittent inflammation in both hands and both knees.  She was in a wheelchair during examination today.  In the past she was evaluated at pain management but the patient does not want to take narcotics.  She feels frustrated since she is unable to perform her ADLs without assistance due to her level of pain.  She denies any recent infections.    Activities of Daily Living:  Patient reports morning stiffness for all day. Patient Reports nocturnal pain.  Difficulty dressing/grooming: Reports Difficulty climbing stairs: Reports Difficulty getting out of chair: Reports Difficulty using hands for taps, buttons, cutlery, and/or writing: Reports  Review of Systems  Constitutional:  Positive for fatigue.  HENT:  Positive for mouth dryness. Negative for mouth sores and nose dryness.   Eyes:  Positive for itching. Negative for pain, visual disturbance and dryness.  Respiratory:  Negative for cough and hemoptysis.   Cardiovascular:  Positive for palpitations. Negative for chest pain, hypertension and swelling in legs/feet.  Gastrointestinal:  Negative for blood in stool, constipation and diarrhea.  Endocrine: Negative for increased urination.  Genitourinary:  Negative for difficulty urinating and painful urination.  Musculoskeletal:   Positive for joint pain, joint pain, joint swelling, myalgias, morning stiffness and myalgias. Negative for muscle weakness and muscle tenderness.  Skin:  Positive for rash. Negative for color change, pallor, hair loss, nodules/bumps, skin tightness, ulcers and sensitivity to sunlight.  Allergic/Immunologic: Negative for susceptible to infections.  Neurological:  Positive for headaches. Negative for dizziness and memory loss.  Hematological:  Positive for bruising/bleeding tendency. Negative for swollen glands.  Psychiatric/Behavioral:  Negative for depressed mood, confusion and sleep disturbance. The patient is not nervous/anxious.    PMFS History:  Patient Active Problem List   Diagnosis Date Noted   Aortic atherosclerosis ([place]) 05/13/2021   Acute otalgia, right 11/26/2020   Arthralgia of right temporomandibular joint 11/26/2020   Bilateral impacted cerumen 11/26/2020   HCAP (healthcare-associated pneumonia) 10/02/2018   CHF exacerbation ([place]) 10/01/2018   Community acquired pneumonia 10/01/2018   OSA (obstructive sleep apnea) 02/10/2017   Carpal tunnel syndrome, right 12/15/2016   Extensor tenosynovitis of wrist, right 12/15/2016   Bilateral primary osteoarthritis of knee 10/08/2015   Rheumatoid arthritis, 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[place]) 10/08/2015   Acute on chronic diastolic CHF (congestive heart failure) ([place]) 08/08/2015   Morbid obesity ([place]) 05/06/2015   Essential hypertension 04/10/2015   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   Primary osteoarthritis involving multiple joints 04/09/2015   Elevated uric acid in blood 01/23/2014   High risk medication use 01/23/2014   Numbness and tingling in left hand 10/01/2013   Tobacco abuse 10/01/2013   Elevated CA-125 02/06/2013  Pelvic mass in female 01/29/2013   Diabetes mellitus ([place]) 08/10/2012   Atrial fibrillation (H[facility])    Chronic diastolic CHF (congestive heart failure) ([place])     Past Medical History:   Diagnosis Date   Acute on chronic diastolic CHF (congestive heart failure) ([place]) 08/08/2015   Anxiety    Atrial fibrillation, persistent (HCC)    Carpal tunnel syndrome, right 12/15/2016   CHF (congestive heart failure) ([place]) 2013   No echo found from that time, most likely diastolic   CHF exacerbation ([place]) 10/01/2018   Depression    Diabetes mellitus ([place]) 08/10/2012   Diabetes mellitus without complication ([place])    Hypertension    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   Morbid obesity ([place]) 05/06/2015   Obesity    Obstructive sleep apnea    Sleep study performed 10/18/2008. AHI-6.8/hr, during REM-27.3/hr. RDI-25.0/hr, during REM-40.9/hr. avg o2 sat during REM and NREM 95%   OSA (obstructive sleep apnea) 02/10/2017   Pelvic mass in female 01/29/2013   With ascites    Pneumonia 10/02/2018   Rheumatoid arthritis ([place])    Rheumatoid arthritis, 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[place]) 10/08/2015    Family History  Problem Relation Age of Onset   Hypertension Mother    Diabetes Mother    Throat cancer Mother    Hypertension Father    Diabetes Paternal Grandmother    Diabetes Sister    Heart Problems Sister    Kidney failure Sister    Hypertension Brother    Diabetes Brother    Healthy Son    Autoimmune disease Daughter    Past Surgical History:  Procedure Laterality Date   CARDIOVASCULAR STRESS TEST  02/16/2013   no significant EKG changes with Lexiscan, normal LV function and normal wall function   CESAREAN SECTION     x2   DOPPLER ECHOCARDIOGRAPHY  01/24/2008   EF 55-60%, no diagnostic evidence of LV wall motion abnormalities, LV wall thickness was mild-moderately increased.   HAND SURGERY Right 2018   LAPAROTOMY Right 02/27/2013   Procedure: EXPLORATORY LAPAROTOMY, right salpingo-oopherectomy;  Surgeon: [name], MD;  Location: [facility];  Service: Gynecology;  Laterality: Right;   left breast cyst     SALPINGOOPHORECTOMY Right 02/27/2013   Procedure:  SALPINGO OOPHORECTOMY;  Surgeon: [name], MD;  Location: [facility];  Service: Gynecology;  Laterality: Right;   TUBAL LIGATION Bilateral 1989   Social History   Social History Narrative   Not on file    There is no immunization history on file for this patient.   Objective: Vital Signs: BP 133/78 (BP Location: Right Arm, Patient Position: Sitting, Cuff Size: Large)   Pulse 83   Resp 12   Ht '5\' 2"'$  (1.575 m)   Wt 252 lb (114.3 kg)   LMP 10/28/2013   BMI 46.09 kg/m    Physical Exam Vitals and nursing note reviewed.  Constitutional:      Appearance: She is well-developed.  HENT:     Head: Normocephalic and atraumatic.  Eyes:     Conjunctiva/sclera: Conjunctivae normal.  Pulmonary:     Effort: Pulmonary effort is normal.  Abdominal:     Palpations: Abdomen is soft.  Musculoskeletal:     Cervical back: Normal range of motion.  Skin:    General: Skin is warm and dry.     Capillary Refill: Capillary refill takes less than 2 seconds.  Neurological:     Mental Status: She is alert and oriented to person, place, and time.  Psychiatric:        Behavior: Behavior normal.     Musculoskeletal Exam: C-spine has good range of motion.  Postural thoracic kyphosis noted.  Patient remained in wheelchair during the examination today.  Painful range of motion of both shoulder joints.  Rheumatoid nodules palpable on both elbows.  Painful limited range of motion of both wrist joints.  Synovitis of the left wrist noted.  Tenderness and synovitis over the right third PIP joint.  Incomplete fist formation.  Hip joints difficult to assess.  Painful and limited ROM of both knees.  Warmth of both knees noted.  Ankle joints have good ROM.   CDAI Exam: CDAI Score: 10.4  Patient Global: 8 mm; Provider Global: 6 mm Swollen: 4 ; Tender: 5  Joint Exam 07/22/2021      Right  Left  Wrist   Tender  Swollen Tender  PIP 3  Swollen Tender     Knee  Swollen Tender  Swollen Tender      Investigation: No additional findings.  Imaging: No results found.  Recent Labs: Lab Results  Component Value Date   WBC 5.8 06/17/2021   HGB 10.7 (L) 06/17/2021   PLT 268 06/17/2021   NA 138 06/17/2021   K 3.5 06/17/2021   CL 108 06/17/2021   CO2 21 (L) 06/17/2021   GLUCOSE 203 (H) 06/17/2021   BUN 11 06/17/2021   CREATININE 0.98 06/17/2021   BILITOT 0.4 04/02/2021   ALKPHOS 105 07/21/2020   AST 14 04/02/2021   ALT 16 04/02/2021   PROT 6.9 04/02/2021   ALBUMIN 4.0 07/21/2020   CALCIUM 8.9 06/17/2021   GFRAA 51 (L) 04/02/2021   QFTBGOLDPLUS NEGATIVE 01/12/2021    Speciality Comments: dxd 2015 at [place].  An inadequate response to Plaquenil, Humira, Remicade.  On methotrexate and prednisone since 2015.  Orencia added May 2021.  Prednisone 5 mg p.o. daily  Procedures:  No procedures performed Allergies: Ace inhibitors and Lisinopril   Assessment / Plan:     Visit Diagnoses: Rheumatoid arthritis with rheumatoid factor of multiple sites without organ or systems involvement [facility]): She presents today with ongoing pain in multiple joints including both shoulders, both hands, both knee joints.  She has difficulty performing ADLs without assistance due to the severity of pain she has been under.  She has been advised to avoid NSAIDs due to elevated creatinine and low GFR as well as due to the interaction with Eliquis.  She is on long-term prednisone 5 mg daily which she is unable to taper.  We cannot increase the dose of prednisone due to uncontrolled type 2 diabetes and history of CHF.  She is currently on Orencia 125 mg 5 days injections once weekly, methotrexate 7 tablets by mouth once weekly, folic acid 2 mg by mouth daily.  According to the patient she has not missed any doses of these medications recently.  She has not had any recent infections.  She previously had an inadequate response to Plaquenil, Humira, and Remicade.  She has been on methotrexate and prednisone since 2015  and has been on Orencia since May 2021.  According to the patient she continues to find methotrexate and Orencia to be effective at managing her rheumatoid arthritis but she is still concerned by the level of pain she experiences on a daily basis.  She was offered a referral to pain management but she declined.  I discussed the importance of avoiding NSAIDs and also the risks of long-term prednisone use.  Patient  requested a prescription for acetaminophen 650 mg 1 tablet daily as needed (dispense 30 tablets with zero refills) to be sent to the pharmacy so her insurance will cover the prescription.  Hepatic function panel will be checked today prior to sending in the prescription.  Discussed the importance of only taking acetaminophen as needed since she is on methotrexate.  We also discussed switching from oral to injectable methotrexate to try to increase the efficacy.  She is in agreement.  We will apply for Rasuvo 17.5 mg subcutaneous injections once weekly through her insurance.  Once she receives the new prescription she was advised to avoid taking oral methotrexate going forward.  She will remain on folic acid, prednisone, and Orencia as prescribed.  She will follow-up in the office in 3 months to assess her response.  High risk medication use -Applying for Rasuvo 17.5 mg sq injections once weekly.  She will continue taking prednisone 5 mg p.o. daily, Orencia 125 mg sq injections weekly.   Inadequate response to Plaquenil Humira and Remicade.  CBC and BMP were updated on 06/17/2021.  Hepatic function panel will be drawn today.  She will continue to require lab work every 3 months to monitor for drug toxicity.  TB Gold negative on 01/12/2021 and will continue to be monitored yearly.- Plan: Hepatic function panel, CBC with Differential/Platelet, COMPLETE METABOLIC PANEL WITH GFR Discussed the importance of holding methotrexate and Orencia if she develops signs or symptoms of an infection and to resume once  infection has completely cleared.  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 Marlou Sa.  She plans to reschedule the procedure.  Current chronic use of systemic steroids - Since 2015. She remains on prednisone 5 mg daily.  She is aware of the risks of long-term prednisone use.  Discussed that she should not increase the dose of prednisone at this time due to her history of type 2 diabetes and CHF.  Primary osteoarthritis of both hands - Severe RA and OA overlap. She has severe pain and stiffness in both hands.  Difficulty making a complete fist.   Primary osteoarthritis of both knees: Severe, end-stage osteoarthritis.  She has painful ROM of both knee joints.  Warmth but no effusion noted.  Patient remained in wheelchair during examination.   Primary osteoarthritis of both feet - Severe RA and OA overlap.  Chronic pain  Idiopathic chronic gout of multiple sites with tophus: She remains on allopurinol 100 g daily.  No signs or symptoms of a gout flare recently.  Other medical conditions are listed as follows:  Chronic diastolic CHF (congestive heart failure) ([place]): She is not a good candidate for TNF inhibitors.  Chronic atrial fibrillation ([place]): She remains on Eliquis.  Advised to avoid NSAIDs.  Essential hypertension: Blood pressure was 133/78 today in the office.  Type 2 diabetes mellitus with hyperglycemia, with long-term current use of insulin ([place]): Unable to increase the dose of prednisone at this time.  OSA (obstructive sleep apnea)  Vitamin D deficiency  History of pneumonia  Former smoker  Orders: Orders Placed This Encounter  Procedures   Hepatic function panel   CBC with Differential/Platelet   COMPLETE METABOLIC PANEL WITH GFR   Meds ordered this encounter  Medications   clobetasol cream (TEMOVATE) 0.05 %    Sig: Apply 1 application topically 2 (two) times daily.    Dispense:  30 g    Refill:  0     Follow-Up  Instructions: Return in  about 3 months (around 10/21/2021) for Rheumatoid arthritis.   Ofilia Neas, PA-C  Note - This record has been created using Dragon software.  Chart creation errors have been sought, but may not always  have been located. Such creation errors do not reflect on  the standard of medical care.

## 2021-07-22 ENCOUNTER — Encounter: Payer: Self-pay | Admitting: Physician Assistant

## 2021-07-22 ENCOUNTER — Ambulatory Visit (INDEPENDENT_AMBULATORY_CARE_PROVIDER_SITE_OTHER): Payer: Medicaid Other | Admitting: Physician Assistant

## 2021-07-22 ENCOUNTER — Other Ambulatory Visit: Payer: Self-pay

## 2021-07-22 ENCOUNTER — Telehealth: Payer: Self-pay

## 2021-07-22 VITALS — BP 133/78 | HR 83 | Resp 12 | Ht 62.0 in | Wt 252.0 lb

## 2021-07-22 DIAGNOSIS — I5032 Chronic diastolic (congestive) heart failure: Secondary | ICD-10-CM

## 2021-07-22 DIAGNOSIS — I482 Chronic atrial fibrillation, unspecified: Secondary | ICD-10-CM

## 2021-07-22 DIAGNOSIS — M170 Bilateral primary osteoarthritis of knee: Secondary | ICD-10-CM

## 2021-07-22 DIAGNOSIS — M0579 Rheumatoid arthritis with rheumatoid factor of multiple sites without organ or systems involvement: Secondary | ICD-10-CM

## 2021-07-22 DIAGNOSIS — M1A09X1 Idiopathic chronic gout, multiple sites, with tophus (tophi): Secondary | ICD-10-CM

## 2021-07-22 DIAGNOSIS — G4733 Obstructive sleep apnea (adult) (pediatric): Secondary | ICD-10-CM

## 2021-07-22 DIAGNOSIS — Z7952 Long term (current) use of systemic steroids: Secondary | ICD-10-CM | POA: Diagnosis not present

## 2021-07-22 DIAGNOSIS — E1165 Type 2 diabetes mellitus with hyperglycemia: Secondary | ICD-10-CM

## 2021-07-22 DIAGNOSIS — M0630 Rheumatoid nodule, unspecified site: Secondary | ICD-10-CM

## 2021-07-22 DIAGNOSIS — M19072 Primary osteoarthritis, left ankle and foot: Secondary | ICD-10-CM

## 2021-07-22 DIAGNOSIS — M19041 Primary osteoarthritis, right hand: Secondary | ICD-10-CM

## 2021-07-22 DIAGNOSIS — Z8701 Personal history of pneumonia (recurrent): Secondary | ICD-10-CM

## 2021-07-22 DIAGNOSIS — Z79899 Other long term (current) drug therapy: Secondary | ICD-10-CM

## 2021-07-22 DIAGNOSIS — M19042 Primary osteoarthritis, left hand: Secondary | ICD-10-CM

## 2021-07-22 DIAGNOSIS — Z794 Long term (current) use of insulin: Secondary | ICD-10-CM

## 2021-07-22 DIAGNOSIS — M19071 Primary osteoarthritis, right ankle and foot: Secondary | ICD-10-CM

## 2021-07-22 DIAGNOSIS — Z87891 Personal history of nicotine dependence: Secondary | ICD-10-CM

## 2021-07-22 DIAGNOSIS — I10 Essential (primary) hypertension: Secondary | ICD-10-CM

## 2021-07-22 DIAGNOSIS — E559 Vitamin D deficiency, unspecified: Secondary | ICD-10-CM

## 2021-07-22 MED ORDER — CLOBETASOL PROPIONATE 0.05 % EX CREA: 1.0000 "application " | TOPICAL_CREAM | Freq: Two times a day (BID) | CUTANEOUS | 0 refills | Status: DC

## 2021-07-22 NOTE — Telephone Encounter (Signed)
Please apply for Rasuvo '20mg'$  per Hazel Sams, PA-C. Thanks!

## 2021-07-22 NOTE — Patient Instructions (Signed)

## 2021-07-23 ENCOUNTER — Telehealth: Payer: Self-pay | Admitting: *Deleted

## 2021-07-23 DIAGNOSIS — Z79899 Other long term (current) drug therapy: Secondary | ICD-10-CM

## 2021-07-23 LAB — HEPATIC FUNCTION PANEL
AG Ratio: 1.9 (calc) (ref 1.0–2.5)
ALT: 39 U/L — ABNORMAL HIGH (ref 6–29)
AST: 26 U/L (ref 10–35)
Albumin: 4.2 g/dL (ref 3.6–5.1)
Alkaline phosphatase (APISO): 82 U/L (ref 37–153)
Bilirubin, Direct: 0.1 mg/dL (ref 0.0–0.2)
Globulin: 2.2 g/dL (calc) (ref 1.9–3.7)
Indirect Bilirubin: 0.3 mg/dL (calc) (ref 0.2–1.2)
Total Bilirubin: 0.4 mg/dL (ref 0.2–1.2)
Total Protein: 6.4 g/dL (ref 6.1–8.1)

## 2021-07-23 NOTE — Progress Notes (Signed)
ALT is slightly elevated.  Patient has been taking MTX 7 tablets once weekly.  The plan was to switch her to rasuvo 17.5 mg sq injections once weekly.   Please advise the patient to avoid tylenol, NSAIDs, and alcohol use since her ALT is elevated.  We will be unable to send in a prescription for tylenol at this time.   Recommend repeating hepatic function panel in 2-3 weeks.

## 2021-07-23 NOTE — Telephone Encounter (Signed)
-----   Message from Ofilia Neas, PA-C sent at 07/23/2021  8:00 AM EDT ----- ALT is slightly elevated.  Patient has been taking MTX 7 tablets once weekly.  The plan was to switch her to rasuvo 17.5 mg sq injections once weekly.   Please advise the patient to avoid tylenol, NSAIDs, and alcohol use since her ALT is elevated.  We will be unable to send in a prescription for tylenol at this time.   Recommend repeating hepatic function panel in 2-3 weeks.

## 2021-07-23 NOTE — Telephone Encounter (Signed)
Submitted a Prior Authorization request to [employer] for RASUVO via CoverMyMeds. Will update once we receive a response.  Dose will be 17.'5mg'$  weekly per OV note.  [id number]  Knox Saliva, PharmD, MPH, BCPS Clinical Pharmacist (Rheumatology and Pulmonology)

## 2021-07-24 ENCOUNTER — Other Ambulatory Visit (HOSPITAL_COMMUNITY): Payer: Self-pay

## 2021-07-24 MED ORDER — RASUVO 17.5 MG/0.35ML ~~LOC~~ SOAJ
17.5000 mg | SUBCUTANEOUS | 0 refills | Status: DC
  Filled 2021-07-24: qty 1.4, 28d supply, fill #0
  Filled 2021-08-28: qty 1.4, 28d supply, fill #1
  Filled 2021-10-05: qty 1.4, 28d supply, fill #2

## 2021-07-24 NOTE — Telephone Encounter (Signed)
Received notification from [place] regarding a prior authorization for RASUVO. Authorization has been APPROVED from 07/23/21 to 07/23/22.   Per test claim, copay for 28 days supply is $4  Patient can fill through [facility]: [phone]. Rx sent for 12 week supply. Authorization # [id number]  Patient will plan to take methotrexate oral tablets this Sunday nad has been advised that pharmacy will reach out to collect copay and schedule shipment to her home. She can start Rasuvo injections on 08/02/21  Knox Saliva, PharmD, MPH, BCPS Clinical Pharmacist (Rheumatology and Pulmonology)

## 2021-07-24 NOTE — Addendum Note (Signed)
Addended by: Cassandria Anger on: 07/24/2021 09:11 AM   Modules accepted: Orders

## 2021-07-26 ENCOUNTER — Other Ambulatory Visit: Payer: Self-pay | Admitting: Rheumatology

## 2021-07-27 ENCOUNTER — Other Ambulatory Visit (HOSPITAL_COMMUNITY): Payer: Self-pay

## 2021-07-27 NOTE — Telephone Encounter (Signed)
Delivery instructions have been updated, medication will be shipped to patient's home address on 9/22 once sufficient inventory has been obtained by pharmacy.

## 2021-07-28 ENCOUNTER — Telehealth: Payer: Self-pay

## 2021-07-28 NOTE — Telephone Encounter (Signed)
Patient called stating at her last appointment with Lovena Le she discussed the Methotrexate injection.  Patient states she has done some research and decided she would rather take Hydroxychloroquine.  She states both medications do the same thing, but Hydroxychloroquine is much safer.

## 2021-07-28 NOTE — Telephone Encounter (Signed)
Upon further review of her chart she had an inadequate response to PLQ in the past.  Injectable MTX is significantly more effective than PLQ will be.  She has been tolerating MTX without any side effects.

## 2021-07-29 NOTE — Telephone Encounter (Signed)
Ok thank you for informing me.  The inadequate response to PLQ was noted at her initial consultation, but I will correct that for future visits.

## 2021-07-29 NOTE — Telephone Encounter (Signed)
Patient advised upon further review of her chart she had an inadequate response to PLQ in the past.  Injectable MTX is significantly more effective than PLQ will be.  She has been tolerating MTX without any side effects.   Patient states she may have been given a prescription for PLQ in the past but she did not take it. Patient states she will try the injectable MTX for a while. Patient wanted you to be aware she never took PLQ.

## 2021-07-30 ENCOUNTER — Other Ambulatory Visit (HOSPITAL_COMMUNITY): Payer: Self-pay

## 2021-07-30 NOTE — Telephone Encounter (Signed)
Called [facility] since patient's Rasuvo has not yet been shipped. They state that they've ATC patient to collect payment since card declined. I reached out to patient to inform her that pharmacy has been reaching out. She states she will add funds to card. I texted her with pharmacy phone and address so she can provide her payment information over the phone herself. Also recommended that she can pick up medication from pharmacy since it is local to [place] and pay with cash. She is due for Rasuvo on Sunday, 08/02/21  Will f/u  Knox Saliva, PharmD, MPH, BCPS Clinical Pharmacist (Rheumatology and Pulmonology)

## 2021-07-31 ENCOUNTER — Other Ambulatory Visit (HOSPITAL_COMMUNITY): Payer: Self-pay

## 2021-08-03 ENCOUNTER — Other Ambulatory Visit (HOSPITAL_COMMUNITY): Payer: Self-pay

## 2021-08-03 NOTE — Telephone Encounter (Signed)
Patient's Rasuvo 17.5mg  weekly shipped from [facility] on Friday, 07/31/21.  Knox Saliva, PharmD, MPH, BCPS Clinical Pharmacist (Rheumatology and Pulmonology)

## 2021-08-10 ENCOUNTER — Other Ambulatory Visit: Payer: Self-pay | Admitting: *Deleted

## 2021-08-10 DIAGNOSIS — Z79899 Other long term (current) drug therapy: Secondary | ICD-10-CM

## 2021-08-11 LAB — HEPATIC FUNCTION PANEL
AG Ratio: 1.7 (calc) (ref 1.0–2.5)
ALT: 9 U/L (ref 6–29)
AST: 13 U/L (ref 10–35)
Albumin: 4.3 g/dL (ref 3.6–5.1)
Alkaline phosphatase (APISO): 87 U/L (ref 37–153)
Bilirubin, Direct: 0.1 mg/dL (ref 0.0–0.2)
Globulin: 2.5 g/dL (calc) (ref 1.9–3.7)
Indirect Bilirubin: 0.4 mg/dL (calc) (ref 0.2–1.2)
Total Bilirubin: 0.5 mg/dL (ref 0.2–1.2)
Total Protein: 6.8 g/dL (ref 6.1–8.1)

## 2021-08-11 NOTE — Progress Notes (Signed)
Liver functions are normal.

## 2021-08-14 ENCOUNTER — Telehealth: Payer: Self-pay | Admitting: Cardiovascular Disease

## 2021-08-14 NOTE — Telephone Encounter (Signed)
Message back from Dr. Johnsie Cancel: She has no documented CAD should see DOD today. There is no availability today.   Reviewed w Dr. Kyla Balzarine nurse who will call pt to advise pt to go to ER.

## 2021-08-14 NOTE — Telephone Encounter (Signed)
Pt c/o of Chest Pain: STAT if CP now or developed within 24 hours  1. Are you having CP right now? Yes, heaviness not pain   2. Are you experiencing any other symptoms (ex. SOB, nausea, vomiting, sweating)? No   3. How long have you been experiencing CP? Since yesterday   4. Is your CP continuous or coming and going? Continuous   5. Have you taken Nitroglycerin? No  ?   Wants appt in regards to this.

## 2021-08-14 NOTE — Telephone Encounter (Addendum)
Chest heaviness and sob constant since yesterday.Not brought on by any activity. There is no pain.  No cough. Has not been sick at all.  BLE are swollen daily, usually better in AM.  This morning they are more swollen than normal.  Has rheumatoid and can't move around much. Has not done anything that makes it worse.  BP 155/93, irrg hb with no reading of heart rate.  Normal kidney function 06/2021.  She takes lasix 40 mg daily and has been told in past w chest heaviness to take the lasix twice a day for 3 days. I have adv her to double lasix (80 mg daily) for 3 days and then go back to 40 mg and call back for worsening of symptoms.  If develops chest pain call EMS go get evaluated.    Pt in agreement w this plan.    She is aware I will forward to Dr. Johnsie Cancel and his nurse and will be called back for any additional recommendations.

## 2021-08-14 NOTE — Telephone Encounter (Signed)
Called patient about Dr. Johnsie Cancel message. Advised patient to go to the ED, since there are no available appointments today with DOD.

## 2021-08-28 ENCOUNTER — Other Ambulatory Visit (HOSPITAL_COMMUNITY): Payer: Self-pay

## 2021-08-31 ENCOUNTER — Other Ambulatory Visit (HOSPITAL_COMMUNITY): Payer: Self-pay

## 2021-09-03 ENCOUNTER — Other Ambulatory Visit (HOSPITAL_COMMUNITY): Payer: Self-pay

## 2021-09-16 ENCOUNTER — Other Ambulatory Visit: Payer: Self-pay | Admitting: Rheumatology

## 2021-09-16 DIAGNOSIS — M059 Rheumatoid arthritis with rheumatoid factor, unspecified: Secondary | ICD-10-CM

## 2021-09-16 NOTE — Telephone Encounter (Signed)
Next Visit: 10/21/2021  Last Visit: 07/22/2021  Last Fill: 07/20/2021  [id number] arthritis with rheumatoid factor of multiple sites without organ or systems involvement   Current Dose per office note 07/22/2021: Orencia 125 mg sq injections weekly.   Labs: 06/17/2021 RBC 3.73, Hgb 10.7, Hct 33.9, RDW 15.6, CO2 21, Glucose 203 08/10/2021 Hepatic Function Panel WNL  TB Gold: 01/12/2021 Neg   Okay to refill Orencia?

## 2021-09-23 ENCOUNTER — Other Ambulatory Visit: Payer: Self-pay | Admitting: Internal Medicine

## 2021-09-23 ENCOUNTER — Ambulatory Visit (INDEPENDENT_AMBULATORY_CARE_PROVIDER_SITE_OTHER): Payer: Medicaid Other

## 2021-09-23 ENCOUNTER — Ambulatory Visit (HOSPITAL_COMMUNITY)
Admission: EM | Admit: 2021-09-23 | Discharge: 2021-09-23 | Disposition: A | Discharge status: Home / Self Care | Payer: Medicaid Other | Attending: Urgent Care | Admitting: Urgent Care

## 2021-09-23 ENCOUNTER — Other Ambulatory Visit: Payer: Self-pay

## 2021-09-23 ENCOUNTER — Encounter (HOSPITAL_COMMUNITY): Payer: Self-pay

## 2021-09-23 DIAGNOSIS — I4891 Unspecified atrial fibrillation: Secondary | ICD-10-CM

## 2021-09-23 DIAGNOSIS — R0602 Shortness of breath: Secondary | ICD-10-CM

## 2021-09-23 DIAGNOSIS — J189 Pneumonia, unspecified organism: Secondary | ICD-10-CM

## 2021-09-23 DIAGNOSIS — E1159 Type 2 diabetes mellitus with other circulatory complications: Secondary | ICD-10-CM | POA: Diagnosis not present

## 2021-09-23 DIAGNOSIS — I110 Hypertensive heart disease with heart failure: Secondary | ICD-10-CM

## 2021-09-23 DIAGNOSIS — R051 Acute cough: Secondary | ICD-10-CM

## 2021-09-23 DIAGNOSIS — R059 Cough, unspecified: Secondary | ICD-10-CM | POA: Diagnosis not present

## 2021-09-23 DIAGNOSIS — E119 Type 2 diabetes mellitus without complications: Secondary | ICD-10-CM

## 2021-09-23 DIAGNOSIS — I509 Heart failure, unspecified: Secondary | ICD-10-CM

## 2021-09-23 DIAGNOSIS — Z872 Personal history of diseases of the skin and subcutaneous tissue: Secondary | ICD-10-CM

## 2021-09-23 DIAGNOSIS — Z794 Long term (current) use of insulin: Secondary | ICD-10-CM

## 2021-09-23 MED ORDER — AMOXICILLIN-POT CLAVULANATE 875-125 MG PO TABS: 1.0000 | ORAL_TABLET | Freq: Two times a day (BID) | ORAL | 0 refills | Status: DC

## 2021-09-23 NOTE — ED Provider Notes (Signed)
[facility]   MRN: [id number] DOB: April 19, 1961  Subjective:   MARIALY URBANCZYK is a 60 y.o. female with pmh of CHF, diabetes 2 treated with insulin, HTN, atrial fibrillation presenting for 1 month history of persistent productive cough, intermittent shortness of breath.  Denies fever, chest pain, wheezing.  No history of COPD.  Patient is immunocompromised due to being on methotrexate for her rheumatoid arthritis.  She also has a history of atrial fibrillation, hypertension and is taking her medications consistently for this.  Does not feel heart racing, palpitations.  She also has a history of community-acquired pneumonia, hospital-acquired pneumonia.  Last episode was in 2019.  She is a former smoker, quit years ago.  No history of COPD, asthma.  Has a history of allergic rhinitis and is taking her medications for her allergies.  In the past 2 weeks has had intermittent right ear pain.  No fever, drainage, tinnitus, dizziness.  No current facility-administered medications for this encounter.  Current Outpatient Medications:    Accu-Chek FastClix Lancets MISC, Apply topically., Disp: , Rfl:    ACCU-CHEK GUIDE test strip, , Disp: , Rfl:    allopurinol (ZYLOPRIM) 100 MG tablet, Take 100 mg by mouth daily., Disp: , Rfl:    apixaban (ELIQUIS) 5 MG TABS tablet, Take 1 tablet (5 mg total) by mouth 2 (two) times daily., Disp: 60 tablet, Rfl: 5   atorvastatin (LIPITOR) 20 MG tablet, Take 1 tablet (20 mg total) by mouth daily., Disp: 90 tablet, Rfl: 2   B-D ULTRAFINE III SHORT PEN 31G X 8 MM MISC, Inject into the skin 2 (two) times daily., Disp: , Rfl:    blood glucose meter kit and supplies, Dispense based on patient and insurance preference. Use up to four times daily as directed. (FOR ICD-10 E10.9, E11.9)., Disp: 1 each, Rfl: 0   clobetasol cream (TEMOVATE) 2.58 %, Apply 1 application topically 2 (two) times daily., Disp: 30 g, Rfl: 0   diclofenac sodium (VOLTAREN) 1 % GEL, Apply 2 g  topically 4 (four) times daily., Disp: 1 Tube, Rfl: 0   folic acid (FOLVITE) 1 MG tablet, Take 1 mg by mouth daily., Disp: , Rfl: 3   furosemide (LASIX) 40 MG tablet, TAKE 1 TABLET BY MOUTH TWICE A DAY (Patient taking differently: Take 40 mg by mouth daily.), Disp: 180 tablet, Rfl: 2   gabapentin (NEURONTIN) 300 MG capsule, Take 300 mg by mouth 3 (three) times daily., Disp: , Rfl:    ibuprofen (ADVIL) 800 MG tablet, Take 800 mg by mouth every evening., Disp: , Rfl:    losartan (COZAAR) 25 MG tablet, TAKE 1 TABLET BY MOUTH EVERY DAY, Disp: 90 tablet, Rfl: 3   metFORMIN (GLUCOPHAGE) 500 MG tablet, Take 2 tablets (1,000 mg total) by mouth 2 (two) times daily., Disp: 60 tablet, Rfl: 0   Methotrexate, PF, (RASUVO) 17.5 MG/0.35ML SOAJ, Inject  1 pen (17.5 mg) into the skin once a week., Disp: 4.2 mL, Rfl: 0   metoprolol tartrate (LOPRESSOR) 25 MG tablet, Take 0.5 tablets (12.5 mg total) by mouth 2 (two) times daily., Disp: 90 tablet, Rfl: 2   NOVOLOG MIX 70/30 FLEXPEN (70-30) 100 UNIT/ML FlexPen, Inject 0.4 mLs (40 Units total) into the skin 2 (two) times daily with a meal., Disp: 15 mL, Rfl: 0   ORENCIA CLICKJECT 527 MG/ML SOAJ, INJECT THE CONTENTS OF 1 PEN INTO THE SKIN EVERY 7 DAYS., Disp: 4 mL, Rfl: 0   polyethylene glycol-electrolytes (NULYTELY) 420 g  solution, See admin instructions. (Patient not taking: Reported on 07/22/2021), Disp: , Rfl:    predniSONE (DELTASONE) 5 MG tablet, TAKE 1 TABLET (5 MG TOTAL) BY MOUTH DAILY., Disp: 30 tablet, Rfl: 2   tiZANidine (ZANAFLEX) 4 MG tablet, Take 4 mg by mouth 2 (two) times daily as needed for muscle spasms., Disp: , Rfl:    triamcinolone cream (KENALOG) 0.5 %, Apply topically 2 (two) times daily as needed., Disp: , Rfl:    Allergies  Allergen Reactions   Ace Inhibitors Cough   Lisinopril Cough    Past Medical History:  Diagnosis Date   Acute on chronic diastolic CHF (congestive heart failure) ([place]) 08/08/2015   Anxiety    Atrial fibrillation,  persistent ([facility])    Carpal tunnel syndrome, right 12/15/2016   CHF (congestive heart failure) ([place]) 2013   No echo found from that time, most likely diastolic   CHF exacerbation ([place]) 10/01/2018   Depression    Diabetes mellitus ([place]) 08/10/2012   Diabetes mellitus without complication ([place])    Hypertension    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 AHI-6.8/hr, during REM-27.3/hr. RDI-25.0/hr, during REM-40.9/hr. avg o2 sat during REM and NREM 95%   OSA (obstructive sleep apnea) 02/10/2017   Pelvic mass in female 01/29/2013   With ascites    Pneumonia 10/02/2018   Rheumatoid arthritis ([place])    Rheumatoid arthritis, 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[place]) 10/08/2015     Past Surgical History:  Procedure Laterality Date   CARDIOVASCULAR STRESS TEST  02/16/2013   no significant EKG changes with Lexiscan, normal LV function and normal wall function   CESAREAN SECTION     x2   DOPPLER ECHOCARDIOGRAPHY  01/24/2008   EF 55-60%, no diagnostic evidence of LV wall motion abnormalities, LV wall thickness was mild-moderately increased.   HAND SURGERY Right 2018   LAPAROTOMY Right 02/27/2013   Procedure: EXPLORATORY LAPAROTOMY, right salpingo-oopherectomy;  Surgeon: [name], MD;  Location: [facility];  Service: Gynecology;  Laterality: Right;   left breast cyst     SALPINGOOPHORECTOMY Right 02/27/2013   Procedure: SALPINGO OOPHORECTOMY;  Surgeon: [name], MD;  Location: [facility];  Service: Gynecology;  Laterality: Right;   TUBAL LIGATION Bilateral 1989    Family History  Problem Relation Age of Onset   Hypertension Mother    Diabetes Mother    Throat cancer Mother    Hypertension Father    Diabetes Paternal Grandmother    Diabetes Sister    Heart Problems Sister    Kidney failure Sister    Hypertension Brother    Diabetes Brother    Healthy Son    Autoimmune  disease Daughter     Social History   Tobacco Use   Smoking status: Former    Packs/day: 0.75    Years: 24.00    Pack years: 18.00    Types: Cigarettes    Quit date: 06/08/2014    Years since quitting: 7.2   Smokeless tobacco: Never   Tobacco comments:    smoking cessation info given.   Vaping Use   Vaping Use: Former  Substance Use Topics   Alcohol use: No    Alcohol/week: 0.0 standard drinks   Drug use: No    ROS   Objective:   Vitals: BP (!) 145/83 (BP Location: Left Arm)   Pulse 88   Temp 98.4 F (36.9 C) (  Oral)   Resp 17   LMP 10/28/2013   SpO2 96%   Physical Exam Constitutional:      General: She is not in acute distress.    Appearance: Normal appearance. She is well-developed. She is obese. She is not ill-appearing, toxic-appearing or diaphoretic.  HENT:     Head: Normocephalic and atraumatic.     Right Ear: Tympanic membrane and ear canal normal. No drainage or tenderness. No middle ear effusion. Tympanic membrane is not erythematous.     Left Ear: Tympanic membrane and ear canal normal. No drainage or tenderness.  No middle ear effusion. Tympanic membrane is not erythematous.     Nose: Nose normal. No congestion or rhinorrhea.     Mouth/Throat:     Mouth: Mucous membranes are moist. No oral lesions.     Pharynx: Oropharynx is clear. No pharyngeal swelling, oropharyngeal exudate, posterior oropharyngeal erythema or uvula swelling.     Tonsils: No tonsillar exudate or tonsillar abscesses.  Eyes:     Extraocular Movements: Extraocular movements intact.     Right eye: Normal extraocular motion.     Left eye: Normal extraocular motion.     Conjunctiva/sclera: Conjunctivae normal.     Pupils: Pupils are equal, round, and reactive to light.  Cardiovascular:     Rate and Rhythm: Normal rate and regular rhythm.     Pulses: Normal pulses.     Heart sounds: Normal heart sounds. No murmur heard.   No friction rub. No gallop.  Pulmonary:     Effort: Pulmonary  effort is normal. No respiratory distress.     Breath sounds: No stridor. Examination of the right-middle field reveals decreased breath sounds. Examination of the left-middle field reveals decreased breath sounds. Examination of the right-lower field reveals decreased breath sounds. Examination of the left-lower field reveals decreased breath sounds. Decreased breath sounds present. No wheezing, rhonchi or rales.  Musculoskeletal:     Cervical back: Normal range of motion and neck supple.  Lymphadenopathy:     Cervical: No cervical adenopathy.  Skin:    General: Skin is warm and dry.     Findings: No rash.  Neurological:     General: No focal deficit present.     Mental Status: She is alert and oriented to person, place, and time.  Psychiatric:        Mood and Affect: Mood normal.        Behavior: Behavior normal.        Thought Content: Thought content normal.    DG Chest 2 View  Result Date: 09/23/2021 CLINICAL DATA:  Shortness of breath with cough. EXAM: CHEST - 2 VIEW COMPARISON:  Chest x-ray 12/29/2018. FINDINGS: Heart is enlarged. There is central interstitial prominence bilaterally. There is no pleural effusion. There is some focal anterior right upper lobe airspace disease best seen on the lateral view. There is no evidence for pneumothorax. No acute fractures are seen. IMPRESSION: 1. Cardiomegaly with mild interstitial edema pattern. 2. Right upper lobe airspace disease worrisome for infection. Followup PA and lateral chest X-ray is recommended in 3-4 weeks following trial of antibiotic therapy to ensure resolution and exclude underlying malignancy. Electronically Signed   By: Ronney Asters M.D.   On: 09/23/2021 17:39     Assessment and Plan :   PDMP not reviewed this encounter.  1. Community acquired pneumonia of right upper lobe of lung   2. Shortness of breath   3. Acute cough   4. Atrial fibrillation, unspecified type (  [place])   5. Type 2 diabetes mellitus treated with  insulin ([place])   6. Chronic congestive heart failure, unspecified heart failure type ([place])    No signs of otitis media, therefore will have her continue with her allergy medications.  As she has community-acquired pneumonia of the right upper lobe, will use Augmentin to manage this which can help with any infection as well.  She has standing orders from her cardiologist to increase her Lasix for episodes which is what she is undergoing and given that she has a mild interstitial edema pattern we will have her do this for the next 3 days.  Emphasized need for follow-up with her cardiologist for recheck on her CHF.  She will need a repeat chest x-ray in 3 to 4 weeks and recommended she follow-up with her PCP for this.  Use supportive care otherwise. Counseled patient on potential for adverse effects with medications prescribed/recommended today, ER and return-to-clinic precautions discussed, patient verbalized understanding.    Jaynee Eagles, [place] 09/23/21 [id number]

## 2021-09-23 NOTE — Discharge Instructions (Addendum)
Please start Augmentin to address the pneumonia of your right upper lung. Take an extra dose of your Lasix (furosemide) the next 3 days. Call your heart doctor tomorrow to go over this and set up a follow up appointment. Call your regular doctor to set up an appointment for a recheck on your pneumonia, need a repeat x-ray in 3-4 weeks.

## 2021-09-23 NOTE — ED Triage Notes (Signed)
Pt presents with shortness of breath and productive cough X 1 month; pt also complains of right ear pain X 2 weeks.

## 2021-09-24 ENCOUNTER — Telehealth: Payer: Self-pay | Admitting: Cardiovascular Disease

## 2021-09-24 DIAGNOSIS — I340 Nonrheumatic mitral (valve) insufficiency: Secondary | ICD-10-CM

## 2021-09-24 NOTE — Telephone Encounter (Signed)
Per Urgent Care Provider "Emphasized need for follow-up with her cardiologist for recheck on her CHF." Will forward message to Dr. Johnsie Cancel to see if we need an echo or any other test.

## 2021-09-24 NOTE — Telephone Encounter (Signed)
Per Dr. Johnsie Cancel, She was seen in ER for right upper lobe pneumonia Can take extra lasix as needed for weight/edema  Fu with primary for this and for repeat CXR in 3-4 weeks She can get an echo for EF and MR.  Called patient with recommendations. Patient verbalized understanding. Placed order for ehco.

## 2021-09-24 NOTE — Telephone Encounter (Signed)
Patient states she was in the urgent care yesterday and was told to contact Dr. Johnsie Cancel and inform him immediately. She states she has pneumonia and was told it may have effected her heart. She states she was given antibiotics, but the urgent care doctor wanted Dr. Johnsie Cancel to determine if she needs more or not.

## 2021-10-05 ENCOUNTER — Other Ambulatory Visit (HOSPITAL_COMMUNITY): Payer: Self-pay

## 2021-10-06 ENCOUNTER — Other Ambulatory Visit (HOSPITAL_COMMUNITY): Payer: Self-pay

## 2021-10-08 NOTE — Progress Notes (Signed)
Office Visit Note  Patient: Kathleen Kane             Date of Birth: [birth year]           MRN: [id number]             PCP: [name], MD Referring: [name], MD Visit Date: 10/21/2021 Occupation: @GUAROCC @  Subjective:  Other (Patient reports open wound under stomach )   History of Present Illness: Kathleen Kane is a 60 y.o. female with a history of rheumatoid arthritis and osteoarthritis.  She states she has been taking Rasuvo and Orencia on a regular basis.  She continues to have pain and discomfort in her shoulders, wrists, hands, hips, knees and ankles.  She has noticed swelling in her hands and her feet.  She states that no fever she was having some itching sensation in her left groin.  Just before she came to the office visit today she scratched the area with a washcloth and noticed some blood on the washcloth.  She states she has a wound in the left groin.  She denies any fever.  Patient states that she was diagnosed with pneumonia on September 23, 2021.  She had a chest x-ray and then a follow-up chest x-ray.  She had a virtual visit with her doctor.  She was treated with antibiotics.  She just finished antibiotics.  She states that she had cough but no fever.  She still has some cough.   Activities of Daily Living:  Patient reports morning stiffness for all day. Patient Reports nocturnal pain.  Difficulty dressing/grooming: Reports Difficulty climbing stairs: Reports Difficulty getting out of chair: Reports Difficulty using hands for taps, buttons, cutlery, and/or writing: Reports  Review of Systems  Constitutional:  Positive for fatigue.  HENT:  Negative for mouth sores, mouth dryness and nose dryness.   Eyes:  Negative for pain, itching and dryness.  Respiratory:  Positive for shortness of breath. Negative for difficulty breathing.   Cardiovascular:  Positive for palpitations. Negative for chest pain.  Gastrointestinal:  Negative for blood in stool,  constipation and diarrhea.  Endocrine: Negative for increased urination.  Genitourinary:  Negative for difficulty urinating.  Musculoskeletal:  Positive for joint pain, joint pain, joint swelling, myalgias, muscle weakness, morning stiffness, muscle tenderness and myalgias.  Skin:  Positive for color change and rash. Negative for sensitivity to sunlight.       & open wound per patient   Allergic/Immunologic: Positive for susceptible to infections.  Neurological:  Positive for numbness, headaches and weakness. Negative for dizziness and memory loss.  Hematological:  Negative for bruising/bleeding tendency.  Psychiatric/Behavioral:  Negative for confusion.    PMFS History:  Patient Active Problem List   Diagnosis Date Noted   Aortic atherosclerosis ([place]) 05/13/2021   Acute otalgia, right 11/26/2020   Arthralgia of right temporomandibular joint 11/26/2020   Bilateral impacted cerumen 11/26/2020   HCAP (healthcare-associated pneumonia) 10/02/2018   CHF exacerbation ([place]) 10/01/2018   Community acquired pneumonia 10/01/2018   OSA (obstructive sleep apnea) 02/10/2017   Carpal tunnel syndrome, right 12/15/2016   Extensor tenosynovitis of wrist, right 12/15/2016   Bilateral primary osteoarthritis of knee 10/08/2015   Rheumatoid arthritis, 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[place]) 10/08/2015   Acute on chronic diastolic CHF (congestive heart failure) ([place]) 08/08/2015   Morbid obesity ([place]) 05/06/2015   Essential hypertension 04/10/2015   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   Primary osteoarthritis involving multiple joints 04/09/2015   Elevated uric acid  in blood 01/23/2014   High risk medication use 01/23/2014   Numbness and tingling in left hand 10/01/2013   Tobacco abuse 10/01/2013   Elevated CA-125 02/06/2013   Pelvic mass in female 01/29/2013   Diabetes mellitus ([place]) 08/10/2012   Atrial fibrillation (HCC)    Chronic diastolic CHF (congestive heart failure) ([place])     Past  Medical History:  Diagnosis Date   Acute on chronic diastolic CHF (congestive heart failure) ([place]) 08/08/2015   Anxiety    Atrial fibrillation, persistent (HCC)    Carpal tunnel syndrome, right 12/15/2016   CHF (congestive heart failure) ([place]) 2013   No echo found from that time, most likely diastolic   CHF exacerbation ([place]) 10/01/2018   Depression    Diabetes mellitus ([place]) 08/10/2012   Diabetes mellitus without complication ([place])    Hypertension    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   Morbid obesity ([place]) 05/06/2015   Obesity    Obstructive sleep apnea    Sleep study performed 10/18/2008. AHI-6.8/hr, during REM-27.3/hr. RDI-25.0/hr, during REM-40.9/hr. avg o2 sat during REM and NREM 95%   OSA (obstructive sleep apnea) 02/10/2017   Pelvic mass in female 01/29/2013   With ascites    Pneumonia 10/02/2018   Rheumatoid arthritis ([place])    Rheumatoid arthritis, 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[place]) 10/08/2015    Family History  Problem Relation Age of Onset   Hypertension Mother    Diabetes Mother    Throat cancer Mother    Hypertension Father    Diabetes Paternal Grandmother    Diabetes Sister    Heart Problems Sister    Kidney failure Sister    Hypertension Brother    Diabetes Brother    Healthy Son    Autoimmune disease Daughter    Past Surgical History:  Procedure Laterality Date   CARDIOVASCULAR STRESS TEST  02/16/2013   no significant EKG changes with Lexiscan, normal LV function and normal wall function   CESAREAN SECTION     x2   DOPPLER ECHOCARDIOGRAPHY  01/24/2008   EF 55-60%, no diagnostic evidence of LV wall motion abnormalities, LV wall thickness was mild-moderately increased.   HAND SURGERY Right 2018   LAPAROTOMY Right 02/27/2013   Procedure: EXPLORATORY LAPAROTOMY, right salpingo-oopherectomy;  Surgeon: [name], MD;  Location: [facility];  Service: Gynecology;  Laterality: Right;   left breast cyst     SALPINGOOPHORECTOMY Right 02/27/2013    Procedure: SALPINGO OOPHORECTOMY;  Surgeon: [name], MD;  Location: [facility];  Service: Gynecology;  Laterality: Right;   TUBAL LIGATION Bilateral 1989   Social History   Social History Narrative   Not on file    There is no immunization history on file for this patient.   Objective: Vital Signs: BP 121/75 (BP Location: Left Arm, Patient Position: Sitting, Cuff Size: Large)   Pulse 88   Ht 5\' 2"  (1.575 m)   Wt 240 lb (108.9 kg) Comment: per patient  LMP 10/28/2013   BMI 43.90 kg/m    Physical Exam Vitals and nursing note reviewed.  Constitutional:      Appearance: She is well-developed.  HENT:     Head: Normocephalic and atraumatic.  Eyes:     Conjunctiva/sclera: Conjunctivae normal.  Cardiovascular:     Rate and Rhythm: Normal rate and regular rhythm.     Heart sounds: Normal heart sounds.  Pulmonary:     Effort: Pulmonary effort is normal.     Comments: Decreased breath sounds were noted in the lung  bases. Abdominal:     General: Bowel sounds are normal.     Palpations: Abdomen is soft.  Musculoskeletal:     Cervical back: Normal range of motion.  Lymphadenopathy:     Cervical: No cervical adenopathy.  Skin:    General: Skin is warm and dry.     Capillary Refill: Capillary refill takes less than 2 seconds.     Comments: Erythematous rash was noted in the left inguinal region with excoriation marks and skin ulceration.  Neurological:     Mental Status: She is alert and oriented to person, place, and time.  Psychiatric:        Behavior: Behavior normal.     Musculoskeletal Exam: She had good range of motion of her cervical spine.  She had painful range of motion of bilateral shoulder joints with abduction limited to about 110 degrees.  Elbow joints in good range of motion.  She had limited range of motion of bilateral wrist joints with synovitis.  Synovitis noted over MCPs and PIP joints.  Hip joints were difficult to assess during the sitting position.  She  has swelling in her bilateral knee joints and ankles.  There was  tenderness on palpation of her MTPs.  CDAI Exam: CDAI Score: 21.4  Patient Global: 6 mm; Provider Global: 8 mm Swollen: 11 ; Tender: 17  Joint Exam 10/21/2021      Right  Left  Glenohumeral   Tender   Tender  Wrist  Swollen Tender  Swollen Tender  MCP 2  Swollen Tender  Swollen Tender  MCP 3  Swollen Tender  Swollen Tender  MCP 4     Swollen Tender  Knee  Swollen Tender  Swollen Tender  Ankle  Swollen Tender  Swollen Tender  MTP 1      Tender  MTP 2   Tender   Tender  MTP 3   Tender        Investigation: No additional findings.  Imaging: DG Chest 2 View  Result Date: 09/23/2021 CLINICAL DATA:  Shortness of breath with cough. EXAM: CHEST - 2 VIEW COMPARISON:  Chest x-ray 12/29/2018. FINDINGS: Heart is enlarged. There is central interstitial prominence bilaterally. There is no pleural effusion. There is some focal anterior right upper lobe airspace disease best seen on the lateral view. There is no evidence for pneumothorax. No acute fractures are seen. IMPRESSION: 1. Cardiomegaly with mild interstitial edema pattern. 2. Right upper lobe airspace disease worrisome for infection. Followup PA and lateral chest X-ray is recommended in 3-4 weeks following trial of antibiotic therapy to ensure resolution and exclude underlying malignancy. Electronically Signed   By: Ronney Asters M.D.   On: 09/23/2021 17:39    Recent Labs: Lab Results  Component Value Date   WBC 5.8 06/17/2021   HGB 10.7 (L) 06/17/2021   PLT 268 06/17/2021   NA 138 06/17/2021   K 3.5 06/17/2021   CL 108 06/17/2021   CO2 21 (L) 06/17/2021   GLUCOSE 203 (H) 06/17/2021   BUN 11 06/17/2021   CREATININE 0.98 06/17/2021   BILITOT 0.5 08/10/2021   ALKPHOS 105 07/21/2020   AST 13 08/10/2021   ALT 9 08/10/2021   PROT 6.8 08/10/2021   ALBUMIN 4.0 07/21/2020   CALCIUM 8.9 06/17/2021   GFRAA 51 (L) 04/02/2021   QFTBGOLDPLUS NEGATIVE 01/12/2021     Speciality Comments: dxd 2015 at [facility].  An inadequate response to Plaquenil, Humira, Remicade.  On methotrexate and prednisone since 2015.  Orencia added May  2021.  Prednisone 5 mg p.o. daily  Procedures:  No procedures performed Allergies: Ace inhibitors and Lisinopril   Assessment / Plan:     Visit Diagnoses: Rheumatoid arthritis with rheumatoid factor of multiple sites without organ or systems involvement ([place]) -she continues to have severe arthritis involving multiple joints.  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  Patient states that she was recently diagnosed with pneumonia and was treated with antibiotics.  She did not stop methotrexate and Orencia.  I advised her to stop methotrexate and Orencia now.  She noticed a rash in her left inguinal region which is being oozing blood.  She is planning to go to the urgent care right after this appointment.  I advised her to discontinue Orencia and methotrexate and and advised not to restart until the lesions have completely healed.  She still has quite aggressive disease despite taking the combination therapy.  Plan: Sedimentation rate  High risk medication use - Rasuvo 17.5 mg sq injections once weekly, Orencia 125 mg sq injections weekly, prednisone 5mg  po qd.   Inadequate response to Plaquenil Humira and Remicade - Plan: CBC with Differential/Platelet, COMPLETE METABOLIC PANEL WITH GFR today and then every 3 months if she resumes her medications.  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 Marlou Sa.  She has nodulosis over her elbows and over her bilateral hands.  Current chronic use of systemic steroids - Since 2015. She remains on prednisone 5 mg daily.  Primary osteoarthritis of both hands - Severe RA and OA overlap.   Primary osteoarthritis of both knees - Severe, end-stage osteoarthritis.   Primary osteoarthritis of both feet - Severe RA and OA  overlap.  Idiopathic chronic gout of multiple sites with tophus - allopurinol 100 g daily. - Plan: Uric acid  Subacute cough-patient was seen at urgent care and was diagnosed with pneumonia.  She was treated with antibiotics.  She still has a productive cough.  Patient states she had a virtual visit.  She will go to the urgent care today again.  Rash-she noticed a rash in her left inguinal region.  Hyperpigmented erythematous rash within her excoriation marks and a skin ulceration was noted.  Patient is going to the urgent care right after this appointment for evaluation and treatment.  Chronic diastolic CHF (congestive heart failure) ([place]) - She is not a good candidate for TNF inhibitors.  Patient has an appointment with the cardiologist tomorrow.  Chronic atrial fibrillation ([place]) - She remains on Eliquis.  Advised to avoid NSAIDs.  Essential hypertension-blood pressure was normal today.  OSA (obstructive sleep apnea)  Type 2 diabetes mellitus with hyperglycemia, with long-term current use of insulin ([place])  History of pneumonia  Vitamin D deficiency  Former smoker  Orders: Orders Placed This Encounter  Procedures   CBC with Differential/Platelet   COMPLETE METABOLIC PANEL WITH GFR   Sedimentation rate   Uric acid    No orders of the defined types were placed in this encounter.    Follow-Up Instructions: Return in about 3 months (around 01/19/2022) for Rheumatoid arthritis, Osteoarthritis.   Bo Merino, MD  Note - This record has been created using Editor, commissioning.  Chart creation errors have been sought, but may not always  have been located. Such creation errors do not reflect on  the standard of medical care.

## 2021-10-16 ENCOUNTER — Other Ambulatory Visit: Payer: Self-pay | Admitting: Physician Assistant

## 2021-10-16 DIAGNOSIS — M059 Rheumatoid arthritis with rheumatoid factor, unspecified: Secondary | ICD-10-CM

## 2021-10-19 ENCOUNTER — Encounter: Payer: Self-pay | Admitting: Podiatry

## 2021-10-19 ENCOUNTER — Ambulatory Visit: Payer: Medicaid Other | Admitting: Podiatry

## 2021-10-19 ENCOUNTER — Other Ambulatory Visit: Payer: Self-pay

## 2021-10-19 DIAGNOSIS — M2141 Flat foot [pes planus] (acquired), right foot: Secondary | ICD-10-CM | POA: Diagnosis not present

## 2021-10-19 DIAGNOSIS — B351 Tinea unguium: Secondary | ICD-10-CM | POA: Diagnosis not present

## 2021-10-19 DIAGNOSIS — M2142 Flat foot [pes planus] (acquired), left foot: Secondary | ICD-10-CM

## 2021-10-19 DIAGNOSIS — E1142 Type 2 diabetes mellitus with diabetic polyneuropathy: Secondary | ICD-10-CM | POA: Diagnosis not present

## 2021-10-19 DIAGNOSIS — E119 Type 2 diabetes mellitus without complications: Secondary | ICD-10-CM

## 2021-10-19 DIAGNOSIS — M79674 Pain in right toe(s): Secondary | ICD-10-CM

## 2021-10-19 DIAGNOSIS — M79675 Pain in left toe(s): Secondary | ICD-10-CM | POA: Diagnosis not present

## 2021-10-19 NOTE — Progress Notes (Signed)
ANNUAL DIABETIC FOOT EXAM  Subjective: Kathleen Kane presents today for for annual diabetic foot examination and thick, elongated toenails R 3rd toe which are tender when wearing enclosed shoe gear..  Patient relates 15 year h/o diabetes.  Patient denies any h/o foot wounds.  Patient has been diagnosed with neuropathy and it is managed with gabapentin.  Patient's blood sugar was 176 mg/dl today.   Patient's husband is present during today's visit. They voice no new pedal problems on today's visit.  Kathleen Ebbs, MD is patient's PCP. Last visit was 10/19/2021.  Past Medical History:  Diagnosis Date   Acute on chronic diastolic CHF (congestive heart failure) ([place]) 08/08/2015   Anxiety    Atrial fibrillation, persistent (HCC)    Carpal tunnel syndrome, right 12/15/2016   CHF (congestive heart failure) ([place]) 2013   No echo found from that time, most likely diastolic   CHF exacerbation ([place]) 10/01/2018   Depression    Diabetes mellitus ([place]) 08/10/2012   Diabetes mellitus without complication ([place])    Hypertension    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   Morbid obesity ([place]) 05/06/2015   Obesity    Obstructive sleep apnea    Sleep study performed 10/18/2008. AHI-6.8/hr, during REM-27.3/hr. RDI-25.0/hr, during REM-40.9/hr. avg o2 sat during REM and NREM 95%   OSA (obstructive sleep apnea) 02/10/2017   Pelvic mass in female 01/29/2013   With ascites    Pneumonia 10/02/2018   Rheumatoid arthritis ([place])    Rheumatoid arthritis, 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[place]) 10/08/2015   Patient Active Problem List   Diagnosis Date Noted   Aortic atherosclerosis ([place]) 05/13/2021   Acute otalgia, right 11/26/2020   Arthralgia of right temporomandibular joint 11/26/2020   Bilateral impacted cerumen 11/26/2020   HCAP (healthcare-associated pneumonia) 10/02/2018   CHF exacerbation ([place]) 10/01/2018   Community acquired pneumonia 10/01/2018   OSA (obstructive sleep apnea)  02/10/2017   Carpal tunnel syndrome, right 12/15/2016   Extensor tenosynovitis of wrist, right 12/15/2016   Bilateral primary osteoarthritis of knee 10/08/2015   Rheumatoid arthritis, 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[place]) 10/08/2015   Acute on chronic diastolic CHF (congestive heart failure) ([place]) 08/08/2015   Morbid obesity ([place]) 05/06/2015   Essential hypertension 04/10/2015   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   Primary osteoarthritis involving multiple joints 04/09/2015   Elevated uric acid in blood 01/23/2014   High risk medication use 01/23/2014   Numbness and tingling in left hand 10/01/2013   Tobacco abuse 10/01/2013   Elevated CA-125 02/06/2013   Pelvic mass in female 01/29/2013   Diabetes mellitus ([place]) 08/10/2012   Atrial fibrillation (HCC)    Chronic diastolic CHF (congestive heart failure) ([place])    Past Surgical History:  Procedure Laterality Date   CARDIOVASCULAR STRESS TEST  02/16/2013   no significant EKG changes with Lexiscan, normal LV function and normal wall function   CESAREAN SECTION     x2   DOPPLER ECHOCARDIOGRAPHY  01/24/2008   EF 55-60%, no diagnostic evidence of LV wall motion abnormalities, LV wall thickness was mild-moderately increased.   HAND SURGERY Right 2018   LAPAROTOMY Right 02/27/2013   Procedure: EXPLORATORY LAPAROTOMY, right salpingo-oopherectomy;  Surgeon: [name], MD;  [facility];  Service: Gynecology;  Laterality: Right;   left breast cyst     SALPINGOOPHORECTOMY Right 02/27/2013   Procedure: SALPINGO OOPHORECTOMY;  Surgeon: [name], MD;  [facility];  Service: Gynecology;  Laterality: Right;   TUBAL LIGATION Bilateral 1989   Current Outpatient  Medications on File Prior to Visit  Medication Sig Dispense Refill   Accu-Chek FastClix Lancets MISC Apply topically.     ACCU-CHEK GUIDE test strip      allopurinol (ZYLOPRIM) 100 MG tablet Take 100 mg by mouth daily.     amoxicillin-clavulanate (AUGMENTIN)  875-125 MG tablet Take 1 tablet by mouth every 12 (twelve) hours. (Patient not taking: Reported on 10/21/2021) 20 tablet 0   apixaban (ELIQUIS) 5 MG TABS tablet Take 1 tablet (5 mg total) by mouth 2 (two) times daily. 60 tablet 5   atorvastatin (LIPITOR) 20 MG tablet Take 1 tablet (20 mg total) by mouth daily. 90 tablet 2   B-D ULTRAFINE III SHORT PEN 31G X 8 MM MISC Inject into the skin 2 (two) times daily.     blood glucose meter kit and supplies Dispense based on patient and insurance preference. Use up to four times daily as directed. (FOR ICD-10 E10.9, E11.9). 1 each 0   clobetasol cream (TEMOVATE) 1.44 % Apply 1 application topically 2 (two) times daily. 30 g 0   diclofenac sodium (VOLTAREN) 1 % GEL Apply 2 g topically 4 (four) times daily. 1 Tube 0   folic acid (FOLVITE) 1 MG tablet Take 1 mg by mouth daily.  3   furosemide (LASIX) 40 MG tablet TAKE 1 TABLET BY MOUTH TWICE A DAY (Patient taking differently: Take 40 mg by mouth daily.) 180 tablet 2   gabapentin (NEURONTIN) 300 MG capsule Take 300 mg by mouth 3 (three) times daily.     ibuprofen (ADVIL) 800 MG tablet Take 800 mg by mouth every evening.     losartan (COZAAR) 25 MG tablet TAKE 1 TABLET BY MOUTH EVERY DAY 90 tablet 3   metFORMIN (GLUCOPHAGE) 500 MG tablet Take 2 tablets (1,000 mg total) by mouth 2 (two) times daily. 60 tablet 0   Methotrexate, PF, (RASUVO) 17.5 MG/0.35ML SOAJ Inject  1 pen (17.5 mg) into the skin once a week. 4.2 mL 0   metoprolol tartrate (LOPRESSOR) 25 MG tablet Take 0.5 tablets (12.5 mg total) by mouth 2 (two) times daily. 90 tablet 2   NOVOLOG MIX 70/30 FLEXPEN (70-30) 100 UNIT/ML FlexPen Inject 0.4 mLs (40 Units total) into the skin 2 (two) times daily with a meal. 15 mL 0   ORENCIA CLICKJECT 315 MG/ML SOAJ INJECT THE CONTENTS OF 1 PEN INTO THE SKIN EVERY 7 DAYS. 4 mL 0   polyethylene glycol-electrolytes (NULYTELY) 420 g solution See admin instructions. (Patient not taking: Reported on 07/22/2021)      predniSONE (DELTASONE) 5 MG tablet TAKE 1 TABLET (5 MG TOTAL) BY MOUTH DAILY. 30 tablet 2   tiZANidine (ZANAFLEX) 4 MG tablet Take 4 mg by mouth 2 (two) times daily as needed for muscle spasms.     triamcinolone cream (KENALOG) 0.5 % Apply topically 2 (two) times daily as needed.     No current facility-administered medications on file prior to visit.    Allergies  Allergen Reactions   Ace Inhibitors Cough   Lisinopril Cough   Social History   Occupational History   Not on file  Tobacco Use   Smoking status: Former    Packs/day: 0.75    Years: 24.00    Pack years: 18.00    Types: Cigarettes    Quit date: 06/08/2014    Years since quitting: 7.3   Smokeless tobacco: Never   Tobacco comments:    smoking cessation info given.   Vaping Use   Vaping Use:  Former  Substance and Sexual Activity   Alcohol use: No    Alcohol/week: 0.0 standard drinks   Drug use: No   Sexual activity: Yes    Partners: Male   Family History  Problem Relation Age of Onset   Hypertension Mother    Diabetes Mother    Throat cancer Mother    Hypertension Father    Diabetes Paternal Grandmother    Diabetes Sister    Heart Problems Sister    Kidney failure Sister    Hypertension Brother    Diabetes Brother    Healthy Son    Autoimmune disease Daughter     There is no immunization history on file for this patient.   Review of Systems: Negative except as noted in the HPI.   Objective: There were no vitals filed for this visit.  Kathleen Kane is a pleasant 60 y.o. female in NAD. AAO X 3.  Vascular Examination: CFT <3 seconds b/l LE. Palpable DP/PT pulses b/l LE. Digital hair absent b/l. Skin temperature gradient WNL b/l. No pain with calf compression b/l. No edema noted b/l. No cyanosis or clubbing noted b/l LE.  Dermatological Examination: Pedal integument with normal turgor, texture and tone b/l LE. No open wounds b/l. No interdigital macerations b/l. Toenails R 3rd toe elongated,  thickened, discolored with subungual debris. +Tenderness with dorsal palpation of nailplates. No hyperkeratotic or porokeratotic lesions present. Anonychia noted 1-5 left, R hallux, R 2nd toe, R 4th toe, and R 5th toe. Nailbed(s) epithelialized.   Musculoskeletal Examination: Muscle strength 5/5 to all lower extremity muscle groups bilaterally. No pain, crepitus or joint limitation noted with ROM bilateral LE. Pes planus deformity noted bilateral LE. Utilizes wheelchair for mobility assistance.  Footwear Assessment: Does the patient wear appropriate shoes? Yes. Does the patient need inserts/orthotics? No.  Neurological Examination: Protective sensation diminished with 10g monofilament b/l.  Hemoglobin A1C Latest Ref Rng & Units 06/17/2021  HGBA1C 4.8 - 5.6 % 9.2(H)  Some recent data might be hidden   Assessment: 1. Pain due to onychomycosis of toenails of both feet   2. Pes planus of both feet   3. Diabetic peripheral neuropathy associated with type 2 diabetes mellitus ([place])   4. Encounter for diabetic foot exam ([place])     ADA Risk Categorization: Low Risk :  Patient has all of the following: Intact protective sensation No prior foot ulcer  No severe deformity Pedal pulses present  Plan: -Diabetic foot examination performed today. -Continue foot and shoe inspections daily. Monitor blood glucose per PCP/Endocrinologist's recommendations. -Mycotic toenails R 3rd toe were debrided in length and girth with sterile nail nippers and dremel without iatrogenic bleeding. -Patient/POA to call should there be question/concern in the interim.  Return in about 3 months (around 01/17/2022).  Marzetta Board, DPM

## 2021-10-21 ENCOUNTER — Ambulatory Visit (HOSPITAL_COMMUNITY)
Admission: EM | Admit: 2021-10-21 | Discharge: 2021-10-21 | Disposition: A | Discharge status: Home / Self Care | Payer: Medicaid Other | Attending: Urgent Care | Admitting: Urgent Care

## 2021-10-21 ENCOUNTER — Ambulatory Visit (INDEPENDENT_AMBULATORY_CARE_PROVIDER_SITE_OTHER): Payer: Medicaid Other

## 2021-10-21 ENCOUNTER — Encounter: Payer: Self-pay | Admitting: Rheumatology

## 2021-10-21 ENCOUNTER — Ambulatory Visit (INDEPENDENT_AMBULATORY_CARE_PROVIDER_SITE_OTHER): Payer: Medicaid Other | Admitting: Rheumatology

## 2021-10-21 ENCOUNTER — Other Ambulatory Visit: Payer: Self-pay

## 2021-10-21 ENCOUNTER — Encounter (HOSPITAL_COMMUNITY): Payer: Self-pay

## 2021-10-21 VITALS — BP 121/75 | HR 88 | Ht 62.0 in | Wt 240.0 lb

## 2021-10-21 DIAGNOSIS — Z7952 Long term (current) use of systemic steroids: Secondary | ICD-10-CM | POA: Diagnosis not present

## 2021-10-21 DIAGNOSIS — M0579 Rheumatoid arthritis with rheumatoid factor of multiple sites without organ or systems involvement: Secondary | ICD-10-CM

## 2021-10-21 DIAGNOSIS — Z87891 Personal history of nicotine dependence: Secondary | ICD-10-CM

## 2021-10-21 DIAGNOSIS — Z794 Long term (current) use of insulin: Secondary | ICD-10-CM | POA: Diagnosis not present

## 2021-10-21 DIAGNOSIS — I482 Chronic atrial fibrillation, unspecified: Secondary | ICD-10-CM

## 2021-10-21 DIAGNOSIS — M19041 Primary osteoarthritis, right hand: Secondary | ICD-10-CM

## 2021-10-21 DIAGNOSIS — M19072 Primary osteoarthritis, left ankle and foot: Secondary | ICD-10-CM

## 2021-10-21 DIAGNOSIS — E119 Type 2 diabetes mellitus without complications: Secondary | ICD-10-CM

## 2021-10-21 DIAGNOSIS — M19042 Primary osteoarthritis, left hand: Secondary | ICD-10-CM

## 2021-10-21 DIAGNOSIS — E559 Vitamin D deficiency, unspecified: Secondary | ICD-10-CM

## 2021-10-21 DIAGNOSIS — Z79899 Other long term (current) drug therapy: Secondary | ICD-10-CM | POA: Diagnosis not present

## 2021-10-21 DIAGNOSIS — Z8701 Personal history of pneumonia (recurrent): Secondary | ICD-10-CM

## 2021-10-21 DIAGNOSIS — M0630 Rheumatoid nodule, unspecified site: Secondary | ICD-10-CM

## 2021-10-21 DIAGNOSIS — G4733 Obstructive sleep apnea (adult) (pediatric): Secondary | ICD-10-CM

## 2021-10-21 DIAGNOSIS — M170 Bilateral primary osteoarthritis of knee: Secondary | ICD-10-CM

## 2021-10-21 DIAGNOSIS — I10 Essential (primary) hypertension: Secondary | ICD-10-CM

## 2021-10-21 DIAGNOSIS — R052 Subacute cough: Secondary | ICD-10-CM

## 2021-10-21 DIAGNOSIS — J189 Pneumonia, unspecified organism: Secondary | ICD-10-CM

## 2021-10-21 DIAGNOSIS — B372 Candidiasis of skin and nail: Secondary | ICD-10-CM | POA: Diagnosis not present

## 2021-10-21 DIAGNOSIS — E11628 Type 2 diabetes mellitus with other skin complications: Secondary | ICD-10-CM | POA: Diagnosis not present

## 2021-10-21 DIAGNOSIS — R21 Rash and other nonspecific skin eruption: Secondary | ICD-10-CM

## 2021-10-21 DIAGNOSIS — E1165 Type 2 diabetes mellitus with hyperglycemia: Secondary | ICD-10-CM

## 2021-10-21 DIAGNOSIS — M1A09X1 Idiopathic chronic gout, multiple sites, with tophus (tophi): Secondary | ICD-10-CM

## 2021-10-21 DIAGNOSIS — L299 Pruritus, unspecified: Secondary | ICD-10-CM | POA: Diagnosis not present

## 2021-10-21 DIAGNOSIS — I5032 Chronic diastolic (congestive) heart failure: Secondary | ICD-10-CM

## 2021-10-21 DIAGNOSIS — M19071 Primary osteoarthritis, right ankle and foot: Secondary | ICD-10-CM

## 2021-10-21 MED ORDER — CEPHALEXIN 500 MG PO CAPS: 500.0000 mg | ORAL_CAPSULE | Freq: Three times a day (TID) | ORAL | 0 refills | Status: DC

## 2021-10-21 MED ORDER — FLUCONAZOLE 150 MG PO TABS: 150.0000 mg | ORAL_TABLET | ORAL | 0 refills | Status: DC

## 2021-10-21 MED ORDER — HYDROXYZINE HCL 25 MG PO TABS: 12.5000 mg | ORAL_TABLET | Freq: Three times a day (TID) | ORAL | 0 refills | Status: DC | PRN

## 2021-10-21 NOTE — ED Triage Notes (Signed)
Pt presents with yeast like wound on lower abdomen & groin area X 1 month that patient states is itchy and painful.

## 2021-10-21 NOTE — Patient Instructions (Signed)
°  Discontinue Rasuvo and Orencia until the rash is completely healed.  You may restart the medications once the rash has completely healed and the cough resolves.   Standing Labs We placed an order today for your standing lab work.   Please have your standing labs drawn in March and every 3 months  If possible, please have your labs drawn 2 weeks prior to your appointment so that the provider can discuss your results at your appointment.  Please note that you may see your imaging and lab results in [place] before we have reviewed them. We may be awaiting multiple results to interpret others before contacting you. Please allow our office up to 72 hours to thoroughly review all of the results before contacting the office for clarification of your results.  We have open lab daily: Monday through Thursday from 1:30-4:30 PM and Friday from 1:30-4:00 PM at the office of Dr. Bo Merino, [facility].   Please be advised, all patients with office appointments requiring lab work will take precedent over walk-in lab work.  If possible, please come for your lab work on Monday and Friday afternoons, as you may experience shorter wait times. The office is located at [address] [place], [place], [place] [address] No appointment is necessary.   Labs are drawn by [facility]. Please bring your co-pay at the time of your lab draw.  You may receive a bill from [facility] for your lab work.  If you wish to have your labs drawn at another location, please call the office 24 hours in advance to send orders.  If you have any questions regarding directions or hours of operation,  please call [phone].   As a reminder, please drink plenty of water prior to coming for your lab work. Thanks!  Vaccines You are taking a medication(s) that can suppress your immune system.  The following immunizations are recommended: Flu annually Covid-19  Td/Tdap (tetanus, diphtheria, pertussis) every 10  years Pneumonia (Prevnar 15 then Pneumovax 23 at least 1 year apart.  Alternatively, can take Prevnar 20 without needing additional dose) Shingrix: 2 doses from 4 weeks to 6 months apart  Please check with your PCP to make sure you are up to date.   If you have signs or symptoms of an infection or start antibiotics: First, call your PCP for workup of your infection. Hold your medication through the infection, until you complete your antibiotics, and until symptoms resolve if you take the following: Injectable medication (Actemra, Benlysta, Cimzia, Cosentyx, Enbrel, Humira, Kevzara, Orencia, Remicade, Simponi, Stelara, Taltz, Tremfya) Methotrexate Leflunomide (Arava) Mycophenolate (Cellcept) Morrie Sheldon, Olumiant, or Rinvoq

## 2021-10-21 NOTE — Discharge Instructions (Addendum)
Please make sure you follow up with your regular doctor to pursue a chest CT scan and make sure the right part of your lung is okay. For now, I do not think this is pneumonia and therefore I will not prescribe more antibiotics. This is also not an emergency so please go through your PCP's office to try and get an outpatient CT scan as soon as possible.

## 2021-10-21 NOTE — ED Provider Notes (Signed)
[place]   MRN: [id number] DOB: Apr 27, 1961  Subjective:   Kathleen Kane is a 60 y.o. female presenting for 1 month history of persistent worsening rash over the left lower abdomen.  Patient states that it primarily itches and does not hurt as much as it does itch.  However its become worse that she continues to scratch the area.  She has been taking an antibiotic for community-acquired pneumonia.  She saw her PCP after she completed the initial round and was put on another one which she completed.  No repeat x-rays were done.  She also has uncontrolled type 2 diabetes treated with insulin on a sliding scale.  Denies any spontaneous drainage of pus or bleeding, fevers, chest pain, shortness of breath.  No current facility-administered medications for this encounter.  Current Outpatient Medications:    Accu-Chek FastClix Lancets MISC, Apply topically., Disp: , Rfl:    ACCU-CHEK GUIDE test strip, , Disp: , Rfl:    allopurinol (ZYLOPRIM) 100 MG tablet, Take 100 mg by mouth daily., Disp: , Rfl:    amoxicillin-clavulanate (AUGMENTIN) 875-125 MG tablet, Take 1 tablet by mouth every 12 (twelve) hours. (Patient not taking: Reported on 10/21/2021), Disp: 20 tablet, Rfl: 0   apixaban (ELIQUIS) 5 MG TABS tablet, Take 1 tablet (5 mg total) by mouth 2 (two) times daily., Disp: 60 tablet, Rfl: 5   atorvastatin (LIPITOR) 20 MG tablet, Take 1 tablet (20 mg total) by mouth daily., Disp: 90 tablet, Rfl: 2   B-D ULTRAFINE III SHORT PEN 31G X 8 MM MISC, Inject into the skin 2 (two) times daily., Disp: , Rfl:    blood glucose meter kit and supplies, Dispense based on patient and insurance preference. Use up to four times daily as directed. (FOR ICD-10 E10.9, E11.9)., Disp: 1 each, Rfl: 0   clobetasol cream (TEMOVATE) 1.69 %, Apply 1 application topically 2 (two) times daily., Disp: 30 g, Rfl: 0   diclofenac sodium (VOLTAREN) 1 % GEL, Apply 2 g topically 4 (four) times daily., Disp: 1 Tube,  Rfl: 0   folic acid (FOLVITE) 1 MG tablet, Take 1 mg by mouth daily., Disp: , Rfl: 3   furosemide (LASIX) 40 MG tablet, TAKE 1 TABLET BY MOUTH TWICE A DAY (Patient taking differently: Take 40 mg by mouth daily.), Disp: 180 tablet, Rfl: 2   gabapentin (NEURONTIN) 300 MG capsule, Take 300 mg by mouth 3 (three) times daily., Disp: , Rfl:    ibuprofen (ADVIL) 800 MG tablet, Take 800 mg by mouth every evening., Disp: , Rfl:    losartan (COZAAR) 25 MG tablet, TAKE 1 TABLET BY MOUTH EVERY DAY, Disp: 90 tablet, Rfl: 3   metFORMIN (GLUCOPHAGE) 500 MG tablet, Take 2 tablets (1,000 mg total) by mouth 2 (two) times daily., Disp: 60 tablet, Rfl: 0   Methotrexate, PF, (RASUVO) 17.5 MG/0.35ML SOAJ, Inject  1 pen (17.5 mg) into the skin once a week., Disp: 4.2 mL, Rfl: 0   metoprolol tartrate (LOPRESSOR) 25 MG tablet, Take 0.5 tablets (12.5 mg total) by mouth 2 (two) times daily., Disp: 90 tablet, Rfl: 2   NOVOLOG MIX 70/30 FLEXPEN (70-30) 100 UNIT/ML FlexPen, Inject 0.4 mLs (40 Units total) into the skin 2 (two) times daily with a meal., Disp: 15 mL, Rfl: 0   ORENCIA CLICKJECT 678 MG/ML SOAJ, INJECT THE CONTENTS OF 1 PEN INTO THE SKIN EVERY 7 DAYS., Disp: 4 mL, Rfl: 0   polyethylene glycol-electrolytes (NULYTELY) 420 g solution, See admin  instructions. (Patient not taking: Reported on 07/22/2021), Disp: , Rfl:    predniSONE (DELTASONE) 5 MG tablet, TAKE 1 TABLET (5 MG TOTAL) BY MOUTH DAILY., Disp: 30 tablet, Rfl: 2   tiZANidine (ZANAFLEX) 4 MG tablet, Take 4 mg by mouth 2 (two) times daily as needed for muscle spasms., Disp: , Rfl:    triamcinolone cream (KENALOG) 0.5 %, Apply topically 2 (two) times daily as needed., Disp: , Rfl:    Allergies  Allergen Reactions   Ace Inhibitors Cough   Lisinopril Cough    Past Medical History:  Diagnosis Date   Acute on chronic diastolic CHF (congestive heart failure) ([place]) 08/08/2015   Anxiety    Atrial fibrillation, persistent (HCC)    Carpal tunnel syndrome, right  12/15/2016   CHF (congestive heart failure) ([place]) 2013   No echo found from that time, most likely diastolic   CHF exacerbation ([facility]) 10/01/2018   Depression    Diabetes mellitus ([place]) 08/10/2012   Diabetes mellitus without complication ([place])    Hypertension    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 AHI-6.8/hr, during REM-27.3/hr. RDI-25.0/hr, during REM-40.9/hr. avg o2 sat during REM and NREM 95%   OSA (obstructive sleep apnea) 02/10/2017   Pelvic mass in female 01/29/2013   With ascites    Pneumonia 10/02/2018   Rheumatoid arthritis ([place])    Rheumatoid arthritis, 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[place]) 10/08/2015     Past Surgical History:  Procedure Laterality Date   CARDIOVASCULAR STRESS TEST  02/16/2013   no significant EKG changes with Lexiscan, normal LV function and normal wall function   CESAREAN SECTION     x2   DOPPLER ECHOCARDIOGRAPHY  01/24/2008   EF 55-60%, no diagnostic evidence of LV wall motion abnormalities, LV wall thickness was mild-moderately increased.   HAND SURGERY Right 2018   LAPAROTOMY Right 02/27/2013   Procedure: EXPLORATORY LAPAROTOMY, right salpingo-oopherectomy;  Surgeon: [name], MD;  Location: [facility];  Service: Gynecology;  Laterality: Right;   left breast cyst     SALPINGOOPHORECTOMY Right 02/27/2013   Procedure: SALPINGO OOPHORECTOMY;  Surgeon: [name], MD;  Location: [facility];  Service: Gynecology;  Laterality: Right;   TUBAL LIGATION Bilateral 1989    Family History  Problem Relation Age of Onset   Hypertension Mother    Diabetes Mother    Throat cancer Mother    Hypertension Father    Diabetes Paternal Grandmother    Diabetes Sister    Heart Problems Sister    Kidney failure Sister    Hypertension Brother    Diabetes Brother    Healthy Son    Autoimmune disease Daughter     Social History   Tobacco  Use   Smoking status: Former    Packs/day: 0.75    Years: 24.00    Pack years: 18.00    Types: Cigarettes    Quit date: 06/08/2014    Years since quitting: 7.3   Smokeless tobacco: Never   Tobacco comments:    smoking cessation info given.   Vaping Use   Vaping Use: Former  Substance Use Topics   Alcohol use: No    Alcohol/week: 0.0 standard drinks   Drug use: No    ROS   Objective:   Vitals: BP 116/76 (BP Location: Left Arm)    Pulse 88    Temp 98 F (36.7 C) (Oral)  Resp 20    LMP 10/28/2013    SpO2 96%   Physical Exam Constitutional:      General: She is not in acute distress.    Appearance: Normal appearance. She is well-developed. She is not ill-appearing.  HENT:     Head: Normocephalic and atraumatic.     Nose: Nose normal.     Mouth/Throat:     Mouth: Mucous membranes are moist.     Pharynx: Oropharynx is clear.  Eyes:     General: No scleral icterus.    Extraocular Movements: Extraocular movements intact.     Pupils: Pupils are equal, round, and reactive to light.  Cardiovascular:     Rate and Rhythm: Normal rate.  Pulmonary:     Effort: Pulmonary effort is normal.  Skin:    General: Skin is warm and dry.  Neurological:     General: No focal deficit present.     Mental Status: She is alert and oriented to person, place, and time.  Psychiatric:        Mood and Affect: Mood normal.        Behavior: Behavior normal.       DG Chest 2 View  Result Date: 10/21/2021 CLINICAL DATA:  Pneumonia EXAM: CHEST - 2 VIEW COMPARISON:  09/23/2021 FINDINGS: Frontal and lateral views of the chest demonstrates stable enlargement of the cardiac silhouette. There is diffuse interstitial prominence, unchanged since prior exam, likely representing scarring and fibrosis. The patchy right upper lobe consolidation best appreciated on the lateral view persists, and could reflect underlying fibrosis. However, given the lack of interval change and background parenchymal lung  disease, follow-up CT chest may be useful. No effusion or pneumothorax. No acute bony abnormalities. IMPRESSION: 1. Diffuse interstitial prominence throughout the lungs, with patchy right upper lobe consolidation again noted on lateral view. I would favor chronic interstitial lung disease and background fibrosis over acute pneumonia though a follow-up chest CT may be useful to exclude underlying infection or neoplasm. Electronically Signed   By: Randa Ngo M.D.   On: 10/21/2021 17:29     Assessment and Plan :   PDMP not reviewed this encounter.  1. Candidiasis of skin   2. Itching   3. Type 2 diabetes mellitus treated with insulin (HCC)    Start fluconazole 150 mg once weekly for 4 weeks.  Start Keflex to address a secondary skin infection.  Recommended follow-up with the wound care clinic as she does have an open wound underneath the pannus. Will have patient follow up with her PCP for a chest CT scan for further evaluation of her abnormal chest x-ray. Counseled patient on potential for adverse effects with medications prescribed/recommended today, ER and return-to-clinic precautions discussed, patient verbalized understanding.    Jaynee Eagles, PA-C 10/21/21 1736

## 2021-10-22 ENCOUNTER — Ambulatory Visit (HOSPITAL_COMMUNITY): Payer: Medicaid Other | Attending: Cardiology

## 2021-10-22 DIAGNOSIS — I340 Nonrheumatic mitral (valve) insufficiency: Secondary | ICD-10-CM | POA: Insufficient documentation

## 2021-10-22 LAB — COMPLETE METABOLIC PANEL WITHOUT GFR
AG Ratio: 1.4 (calc) (ref 1.0–2.5)
ALT: 8 U/L (ref 6–29)
AST: 10 U/L (ref 10–35)
Albumin: 4.2 g/dL (ref 3.6–5.1)
Alkaline phosphatase (APISO): 99 U/L (ref 37–153)
BUN: 14 mg/dL (ref 7–25)
CO2: 26 mmol/L (ref 20–32)
Calcium: 10.2 mg/dL (ref 8.6–10.4)
Chloride: 101 mmol/L (ref 98–110)
Creat: 1.01 mg/dL (ref 0.50–1.05)
Globulin: 3.1 g/dL (ref 1.9–3.7)
Glucose, Bld: 163 mg/dL — ABNORMAL HIGH (ref 65–99)
Potassium: 3.9 mmol/L (ref 3.5–5.3)
Sodium: 140 mmol/L (ref 135–146)
Total Bilirubin: 0.7 mg/dL (ref 0.2–1.2)
Total Protein: 7.3 g/dL (ref 6.1–8.1)
eGFR: 63 mL/min/1.73m2 (ref >=60)

## 2021-10-22 LAB — CBC WITH DIFFERENTIAL/PLATELET
Absolute Monocytes: 143 cells/uL — ABNORMAL LOW (ref 200–950)
Basophils Absolute: 39 cells/uL (ref 0–200)
Basophils Relative: 0.6 %
Eosinophils Absolute: 826 cells/uL — ABNORMAL HIGH (ref 15–500)
Eosinophils Relative: 12.7 %
HCT: 32.1 % — ABNORMAL LOW (ref 35.0–45.0)
Hemoglobin: 10.3 g/dL — ABNORMAL LOW (ref 11.7–15.5)
Lymphs Abs: 1066 cells/uL (ref 850–3900)
MCH: 26.8 pg — ABNORMAL LOW (ref 27.0–33.0)
MCHC: 32.1 g/dL (ref 32.0–36.0)
MCV: 83.6 fL (ref 80.0–100.0)
MPV: 10.3 fL (ref 7.5–12.5)
Monocytes Relative: 2.2 %
Neutro Abs: 4427 cells/uL (ref 1500–7800)
Neutrophils Relative %: 68.1 %
Platelets: 359 10*3/uL (ref 140–400)
RBC: 3.84 10*6/uL (ref 3.80–5.10)
RDW: 14.8 % (ref 11.0–15.0)
Total Lymphocyte: 16.4 %
WBC: 6.5 10*3/uL (ref 3.8–10.8)

## 2021-10-22 LAB — ECHOCARDIOGRAM COMPLETE
MV M vel: 5.1 m/s
MV Peak grad: 103.8 mmHg
Radius: 0.65 cm
S' Lateral: 3.7 cm

## 2021-10-22 LAB — URIC ACID: Uric Acid, Serum: 10.9 mg/dL — ABNORMAL HIGH (ref 2.5–7.0)

## 2021-10-22 LAB — SEDIMENTATION RATE: Sed Rate: 62 mm/h — ABNORMAL HIGH (ref 0–30)

## 2021-10-22 NOTE — Progress Notes (Signed)
Anemia noted due to chronic disease.  Uric acid is high.  Patient may need higher dose of allopurinol which she can discuss with her PCP.  We are not prescribing allopurinol.  Sed rate is elevated which may be due to recent infection and also rheumatoid arthritis flare.  Patient should return for the follow-up visit once the infection resolves.

## 2021-10-22 NOTE — Progress Notes (Deleted)
The patient has been notified of the result and verbalized understanding.  All questions (if any) were answered. Michaelyn Barter, RN 10/22/2021 2:49 PM

## 2021-10-23 ENCOUNTER — Encounter: Payer: Self-pay | Admitting: Podiatry

## 2021-10-27 ENCOUNTER — Other Ambulatory Visit: Payer: Self-pay

## 2021-10-27 ENCOUNTER — Encounter (HOSPITAL_BASED_OUTPATIENT_CLINIC_OR_DEPARTMENT_OTHER): Payer: Medicaid Other | Attending: Internal Medicine | Admitting: Internal Medicine

## 2021-10-27 DIAGNOSIS — S31104A Unspecified open wound of abdominal wall, left lower quadrant without penetration into peritoneal cavity, initial encounter: Secondary | ICD-10-CM | POA: Insufficient documentation

## 2021-10-27 DIAGNOSIS — Z7984 Long term (current) use of oral hypoglycemic drugs: Secondary | ICD-10-CM | POA: Diagnosis not present

## 2021-10-27 DIAGNOSIS — Z87891 Personal history of nicotine dependence: Secondary | ICD-10-CM | POA: Diagnosis not present

## 2021-10-27 DIAGNOSIS — L98491 Non-pressure chronic ulcer of skin of other sites limited to breakdown of skin: Secondary | ICD-10-CM | POA: Diagnosis not present

## 2021-10-27 DIAGNOSIS — B3789 Other sites of candidiasis: Secondary | ICD-10-CM

## 2021-10-27 DIAGNOSIS — E119 Type 2 diabetes mellitus without complications: Secondary | ICD-10-CM | POA: Diagnosis not present

## 2021-10-27 DIAGNOSIS — X58XXXA Exposure to other specified factors, initial encounter: Secondary | ICD-10-CM | POA: Diagnosis not present

## 2021-10-27 DIAGNOSIS — B372 Candidiasis of skin and nail: Secondary | ICD-10-CM | POA: Diagnosis not present

## 2021-10-27 NOTE — Progress Notes (Signed)
Kathleen Kane, Kathleen Kane ([id number]) Visit Report for 10/27/2021 Abuse/Suicide Risk Screen Details Patient Name: Date of Service: Kathleen Kane 10/27/2021 8:00 A M Medical Record Number: [id number] Patient Account Number: [id number] Date of Birth/Sex: Treating RN: [birth year] (60 y.o. [name] Etoile Looman: [name] Other Clinician: Referring Syan Cullimore: Treating Schuyler Behan/Extender: Burnard Leigh in Treatment: 0 Abuse/Suicide Risk Screen Items Answer ABUSE RISK SCREEN: Has anyone close to you tried to hurt or harm you recentlyo No Do you feel uncomfortable with anyone in your familyo No Has anyone forced you do things that you didnt want to doo No Electronic Signature(s) Signed: 10/27/2021 5:11:02 PM By: Baruch Gouty RN, BSN Entered By: Baruch Gouty on 10/27/2021 08:22:27 -------------------------------------------------------------------------------- Activities of Daily Living Details Patient Name: Date of Service: Kathleen Kane 10/27/2021 8:00 A M Medical Record Number: [id number] Patient Account Number: [id number] Date of Birth/Sex: Treating RN: Aug 28, 1961 (60 y.o. [name] Aleigha Gilani: [name] Other Clinician: Referring Lochlin Eppinger: Treating Everitt Wenner/Extender: Burnard Leigh in Treatment: 0 Activities of Daily Living Items Answer Activities of Daily Living (Please select one for each item) Drive Automobile Completely Able T Medications ake Completely Able Use T elephone Completely Able Care for Appearance Need Assistance Use T oilet Completely Able Bath / Shower Need Assistance Dress Self Need Assistance Feed Self Completely Able Walk Need Assistance Get In / Out Bed Need Assistance Housework Need Assistance Prepare Meals Need Assistance Handle Money Completely Able Shop for Self Need Assistance Electronic Signature(s) Signed: 10/27/2021 5:11:02 PM By: Baruch Gouty  RN, BSN Entered By: Baruch Gouty on 10/27/2021 53:97:67 -------------------------------------------------------------------------------- Education Screening Details Patient Name: Date of Service: Kathleen [name] 10/27/2021 8:00 A M Medical Record Number: [id number] Patient Account Number: [id number] Date of Birth/Sex: Treating RN: 26-Dec-1960 (60 y.o. [name] Chamia Schmutz: [name] Other Clinician: Referring Makhia Vosler: Treating Monti Villers/Extender: Burnard Leigh in Treatment: 0 Primary Learner Assessed: Patient Learning Preferences/Education Level/Primary Language Learning Preference: Demonstration, Printed Material Highest Education Level: College or Above Preferred Language: English Cognitive Barrier Language Barrier: No Translator Needed: No Memory Deficit: No Emotional Barrier: No Cultural/Religious Beliefs Affecting Medical Care: No Physical Barrier Impaired Vision: No Impaired Hearing: No Decreased Hand dexterity: Yes Limitations: difficult to grip d/t arthritis Knowledge/Comprehension Knowledge Level: High Comprehension Level: High Ability to understand written instructions: High Ability to understand verbal instructions: High Motivation Anxiety Level: Calm Cooperation: Cooperative Education Importance: Acknowledges Need Interest in Health Problems: Asks Questions Perception: Coherent Willingness to Engage in Self-Management High Activities: Readiness to Engage in Self-Management High Activities: Electronic Signature(s) Signed: 10/27/2021 5:11:02 PM By: Baruch Gouty RN, BSN Entered By: Baruch Gouty on 10/27/2021 08:24:37 -------------------------------------------------------------------------------- Fall Risk Assessment Details Patient Name: Date of Service: Kathleen [name] 10/27/2021 8:00 A M Medical Record Number: [id number] Patient Account Number: [id number] Date of Birth/Sex: Treating  RN: 04-25-1961 (60 y.o. [name] Orvin Netter: [name] Other Clinician: Referring Santhosh Gulino: Treating Marguis Mathieson/Extender: Burnard Leigh in Treatment: 0 Fall Risk Assessment Items Have you had 2 or more falls in the last 12 monthso 0 No Have you had any fall that resulted in injury in the last 12 monthso 0 No FALLS RISK SCREEN History of falling - immediate or within 3 months 0 No Secondary diagnosis (Do you have 2 or more medical diagnoseso) 15 Yes Ambulatory aid None/bed rest/wheelchair/nurse 0 No Crutches/cane/walker 15 Yes Furniture 30 Yes Intravenous therapy Access/Saline/Heparin Lock 0 No Gait/Transferring  Normal/ bed rest/ wheelchair 0 No Weak (short steps with or without shuffle, stooped but able to lift head while walking, may seek 10 Yes support from furniture) Impaired (short steps with shuffle, may have difficulty arising from chair, head down, impaired 0 No balance) Mental Status Oriented to own ability 0 Yes Electronic Signature(s) Signed: 10/27/2021 5:11:02 PM By: Baruch Gouty RN, BSN Entered By: Baruch Gouty on 10/27/2021 08:25:26 -------------------------------------------------------------------------------- Foot Assessment Details Patient Name: Date of Service: Kathleen [name] 10/27/2021 8:00 A M Medical Record Number: [id number] Patient Account Number: [id number] Date of Birth/Sex: Treating RN: 1960-12-25 (60 y.o. [name] Amaira Safley: [name] Other Clinician: Referring Ambrosia Wisnewski: Treating Jaziya Obarr/Extender: Burnard Leigh in Treatment: 0 Foot Assessment Items Site Locations + = Sensation present, - = Sensation absent, C = Callus, U = Ulcer R = Redness, W = Warmth, M = Maceration, PU = Pre-ulcerative lesion F = Fissure, S = Swelling, D = Dryness Assessment Right: Left: Other Deformity: No No Prior Foot Ulcer: No No Prior Amputation: No No Charcot  Joint: No No Ambulatory Status: Ambulatory With Help Assistance Device: Walker Gait: Steady Electronic Signature(s) Signed: 10/27/2021 5:11:02 PM By: Baruch Gouty RN, BSN Entered By: Baruch Gouty on 10/27/2021 08:26:04 -------------------------------------------------------------------------------- Nutrition Risk Screening Details Patient Name: Date of Service: Kathleen [name] 10/27/2021 8:00 A M Medical Record Number: [id number] Patient Account Number: [id number] Date of Birth/Sex: Treating RN: 07/06/1961 (60 y.o. [name] Eriko Economos: [name] Other Clinician: Referring Lauraine Crespo: Treating Casyn Becvar/Extender: Burnard Leigh in Treatment: 0 Height (in): 62 Weight (lbs): 240 Body Mass Index (BMI): 43.9 Nutrition Risk Screening Items Score Screening NUTRITION RISK SCREEN: I have an illness or condition that made me change the kind and/or amount of food I eat 0 No I eat fewer than two meals per day 0 No I eat few fruits and vegetables, or milk products 0 No I have three or more drinks of beer, liquor or wine almost every day 0 No I have tooth or mouth problems that make it hard for me to eat 0 No I don't always have enough money to buy the food I need 0 No I eat alone most of the time 0 No I take three or more different prescribed or over-the-counter drugs a day 1 Yes Without wanting to, I have lost or gained 10 pounds in the last six months 0 No I am not always physically able to shop, cook and/or feed myself 0 No Nutrition Protocols Good Risk Protocol 0 No interventions needed Moderate Risk Protocol High Risk Proctocol Risk Level: Good Risk Score: 1 Electronic Signature(s) Signed: 10/27/2021 5:11:02 PM By: Baruch Gouty RN, BSN Entered By: Baruch Gouty on 10/27/2021 08:25:47

## 2021-10-27 NOTE — Progress Notes (Signed)
RAYLENE, CARMICKLE ([id number]) Visit Report for 10/27/2021 Chief Complaint Document Details Patient Name: Date of Service: MCQ JULIEANN, DRUMMONDS 10/27/2021 8:00 A M Medical Record Number: [id number] Patient Account Number: [id number] Date of Birth/Sex: Treating RN: [birth year] (60 y.o. Debby Bud Primary Care Provider: [name] Other Clinician: Referring Provider: Treating Provider/Extender: Burnard Leigh in Treatment: 0 Information Obtained from: Patient Chief Complaint Candidiasis of the skin of the pannus Electronic Signature(s) Signed: 10/27/2021 9:27:46 AM By: Kalman Shan DO Entered By: Kalman Shan on 10/27/2021 09:07:43 -------------------------------------------------------------------------------- HPI Details Patient Name: Date of Service: [name] 10/27/2021 8:00 A M Medical Record Number: [id number] Patient Account Number: [id number] Date of Birth/Sex: Treating RN: 08/27/1961 (60 y.o. Debby Bud Primary Care Provider: [name] Other Clinician: Referring Provider: Treating Provider/Extender: Burnard Leigh in Treatment: 0 History of Present Illness HPI Description: Admission 10/27/2021 Ms. Tyla Burgner is a 60 year old female with a past medical history of uncontrolled type 2 diabetes on oral agents with last hemoglobin A1c of 9.2, and rheumatoid arthritis that presents to the clinic for healing wounds to her abdomen and left upper thigh. She states she developed symptoms of itching and rash about 1-1/2 months ago. On 10/21/2021 she visited urgent care. She was placed on fluconazole 150 mg once weekly for 4 weeks and Keflex. She reports significant improvement on the medications. She has been keeping the area clean clean and dry. He currently denies signs of infection. Electronic Signature(s) Signed: 10/27/2021 9:27:46 AM By: Kalman Shan DO Entered By: Kalman Shan on 10/27/2021  09:19:56 -------------------------------------------------------------------------------- Physical Exam Details Patient Name: Date of Service: [name] 10/27/2021 8:00 A M Medical Record Number: [id number] Patient Account Number: [id number] Date of Birth/Sex: Treating RN: [birth year] (60 y.o. Debby Bud Primary Care Provider: [name] Other Clinician: Referring Provider: Treating Provider/Extender: Burnard Leigh in Treatment: 0 Constitutional respirations regular, non-labored and within target range for patient.Marland Kitchen Psychiatric pleasant and cooperative. Notes 2 wounds remaining to the left lower abdomen and left upper thigh Limited to skin breakdown. Scattered areas of epithelialization around the wound beds. No signs of infection. Electronic Signature(s) Signed: 10/27/2021 9:27:46 AM By: Kalman Shan DO Entered By: Kalman Shan on 10/27/2021 09:20:57 -------------------------------------------------------------------------------- Physician Orders Details Patient Name: Date of Service: [name] 10/27/2021 8:00 A M Medical Record Number: [id number] Patient Account Number: [id number] Date of Birth/Sex: Treating RN: 05/06/1961 (60 y.o. [name] Provider: [name] Other Clinician: Referring Provider: Treating Provider/Extender: Burnard Leigh in Treatment: 0 Verbal / Phone Orders: No Diagnosis Coding Follow-up Appointments ppointment in 2 weeks. - Dr. Heber [place] Return A Bathing/ Shower/ Hygiene May shower and wash wound with soap and water. - be sure to completely dry abdominal skin fold Wound Treatment Wound #1 - Abdomen - Lower Quadrant Wound Laterality: Left Cleanser: Soap and Water 1 x Per Day/30 Days Discharge Instructions: May shower and wash wound with dial antibacterial soap and water prior to dressing change. Prim Dressing: interdry (DME) (Generic) 1 x Per Day/30  Days ary Discharge Instructions: to abdominal skin fold daily Wound #2 - Upper Leg Wound Laterality: Left, Anterior Cleanser: Soap and Water 1 x Per Day/30 Days Discharge Instructions: May shower and wash wound with dial antibacterial soap and water prior to dressing change. Prim Dressing: interdry (DME) (Generic) 1 x Per Day/30 Days ary Discharge Instructions: to abdominal skin fold daily Electronic Signature(s) Signed: 10/27/2021 9:27:46 AM By: Heber [place],  Janett Billow DO Entered By: Kalman Shan on 10/27/2021 09:23:02 -------------------------------------------------------------------------------- Problem List Details Patient Name: Date of Service: MCQ ZURY, FAZZINO 10/27/2021 8:00 A M Medical Record Number: [id number] Patient Account Number: [id number] Date of Birth/Sex: Treating RN: Apr 04, 1961 (60 y.o. 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 10/27/2021 8:00 A M Medical Record Number: [id number] Patient Account Number: [id number] Date of Birth/Sex: Treating RN: 06/06/1961 (60 y.o. Debby Bud Primary Care Provider: [name] Other  Clinician: Referring Provider: Treating Provider/Extender: Burnard Leigh in Treatment: 0 Subjective Chief Complaint Information obtained from Patient Candidiasis of the skin of the pannus History of Present Illness (HPI) Admission 10/27/2021 Ms. Olean Sangster is a 60 year old female with a past medical history of uncontrolled type 2 diabetes on oral agents with last hemoglobin A1c of 9.2, and rheumatoid arthritis that presents to the clinic for healing wounds to her abdomen and left upper thigh. She states she developed symptoms of itching and rash about 1-1/2 months ago. On 10/21/2021 she visited urgent care. She was placed on fluconazole 150 mg once weekly for 4 weeks and Keflex. She reports significant improvement on the medications. She has been keeping the area clean clean and dry. He currently denies signs of infection. Patient History Information obtained from Patient, Chart. Allergies ACE Inhibitors (Reaction: cough) Family History Diabetes - Siblings, Heart Disease - Siblings,Maternal Grandparents, Hypertension - Father,Mother, Kidney Disease - Siblings, No family history of Cancer, Hereditary Spherocytosis, Lung Disease, Seizures, Stroke, Thyroid Problems, Tuberculosis. Social History Former smoker - quit 2015, Marital Status - Married, Alcohol Use - Never, Drug Use - Prior History - hx TCH, Caffeine Use - Daily - soda. Medical History Ear/Nose/Mouth/Throat Denies history of Chronic sinus problems/congestion, Middle ear problems Respiratory Patient has history of Sleep Apnea - no CPAP Cardiovascular Patient has history of Arrhythmia - afib, Congestive Heart Failure, Hypertension, Peripheral Venous Disease Endocrine Patient has history of Type II Diabetes Denies history of Type I Diabetes Genitourinary Denies history of End Stage Renal Disease Integumentary (Skin) Denies history of History of Burn Musculoskeletal Patient has history of Gout,  Rheumatoid Arthritis Denies history of Osteoarthritis, Osteomyelitis Neurologic Patient has history of Neuropathy Oncologic Denies history of Received Chemotherapy, Received Radiation Psychiatric Patient has history of Confinement Anxiety Denies history of Anorexia/bulimia Patient is treated with Insulin, Oral Agents. Blood sugar is tested. Hospitalization/Surgery History - c-section x2. - right carpal tunnel release. - exploratory lap, right oophrectomy. - salpingoophrectomy. - tubal ligation. Medical A Surgical History Notes nd Constitutional Symptoms (General Health) morbid obesity Ear/Nose/Mouth/Throat chronic cough Respiratory hx PNA Gastrointestinal chronic "stomach pain" Neurologic carpal tunnel right Review of Systems (ROS) Constitutional Symptoms (General Health) Complains or has symptoms of Fatigue. Denies complaints or symptoms of Fever, Chills, Marked Weight Change. Eyes Denies complaints or symptoms of Dry Eyes, Vision Changes, Glasses / Contacts. Ear/Nose/Mouth/Throat Denies complaints or symptoms of Chronic sinus problems or rhinitis.  Respiratory Complains or has symptoms of Chronic or frequent coughs, Shortness of Breath. Cardiovascular Denies complaints or symptoms of Chest pain. Gastrointestinal Denies complaints or symptoms of Frequent diarrhea, Nausea, Vomiting. Endocrine Denies complaints or symptoms of Heat/cold intolerance. Genitourinary Denies complaints or symptoms of Frequent urination. Integumentary (Skin) Complains or has symptoms of Wounds - abdominal fold. Musculoskeletal Complains or has symptoms of Muscle Weakness. Denies complaints or symptoms of Muscle Pain. Neurologic Complains or has symptoms of Numbness/parasthesias - feet. Psychiatric Complains or has symptoms of Claustrophobia. Denies complaints or symptoms of Suicidal. Objective Constitutional respirations regular, non-labored and within target range for patient.. Vitals  Time Taken: 8:05 AM, Height: 62 in, Source: Stated, Weight: 240 lbs, Source: Stated, BMI: 43.9, Temperature: 98.5 F, Pulse: 109 bpm, Respiratory Rate: 20 breaths/min, Blood Pressure: 119/80 mmHg, Capillary Blood Glucose: 157 mg/dl. General Notes: glucose per pt report yesterday Psychiatric pleasant and cooperative. General Notes: 2 wounds remaining to the left lower abdomen and left upper thigh Limited to skin breakdown. Scattered areas of epithelialization around the wound beds. No signs of infection. Integumentary (Hair, Skin) Wound #1 status is Open. Original cause of wound was Gradually Appeared. The date acquired was: 09/28/2021. The wound is located on the Left Abdomen - Lower Quadrant. The wound measures 0.2cm length x 0.2cm width x 0.1cm depth; 0.031cm^2 area and 0.003cm^3 volume. The wound is limited to skin breakdown. There is no tunneling or undermining noted. There is a small amount of serous drainage noted. The wound margin is indistinct and nonvisible. There is small (1-33%) pink granulation within the wound bed. There is no necrotic tissue within the wound bed. Wound #2 status is Open. Original cause of wound was Gradually Appeared. The date acquired was: 09/28/2021. The wound is located on the Left,Anterior Upper Leg. The wound measures 0.4cm length x 0.4cm width x 0.1cm depth; 0.126cm^2 area and 0.013cm^3 volume. The wound is limited to skin breakdown. There is no tunneling or undermining noted. There is a medium amount of serous drainage noted. The wound margin is flat and intact. There is no granulation within the wound bed. There is no necrotic tissue within the wound bed. Assessment Active Problems ICD-10 Other sites of candidiasis Non-pressure chronic ulcer of skin of other sites limited to breakdown of skin Patient presents with a 1-1/26-month history of wounds to her abdomen from candidiasis. They appear well-healing. She is taking fluconazole and Keflex. She only has 2  small areas remaining that are open. At this time I recommended continuing to keep the area clean and dry and she can use Interdry sheets to help with this. We will order this for her. No signs of infection on exam. Follow-up in 2 weeks. I am hopeful she will be healed by then. Plan Follow-up Appointments: Return Appointment in 2 weeks. - Dr. Heber [place] Bathing/ Shower/ Hygiene: May shower and wash wound with soap and water. - be sure to completely dry abdominal skin fold WOUND #1: - Abdomen - Lower Quadrant Wound Laterality: Left Cleanser: Soap and Water 1 x Per Day/30 Days Discharge Instructions: May shower and wash wound with dial antibacterial soap and water prior to dressing change. Prim Dressing: interdry (DME) (Generic) 1 x Per Day/30 Days ary Discharge Instructions: to abdominal skin fold daily WOUND #2: - Upper Leg Wound Laterality: Left, Anterior Cleanser: Soap and Water 1 x Per Day/30 Days Discharge Instructions: May shower and wash wound with dial antibacterial soap and water prior to dressing change. Prim Dressing: interdry (DME) (Generic) 1 x Per  Day/30 Days ary Discharge Instructions: to abdominal skin fold daily 1. Interdry 2. Follow-up in 2 weeks Electronic Signature(s) Signed: 10/27/2021 9:27:46 AM By: Kalman Shan DO Entered By: Kalman Shan on 10/27/2021 09:25:18 -------------------------------------------------------------------------------- HxROS Details Patient Name: Date of Service: [name] 10/27/2021 8:00 A M Medical Record Number: [id number] Patient Account Number: [id number] Date of Birth/Sex: Treating RN: 1961/10/28 (60 y.o. [name] Provider: [name] Other Clinician: Referring Provider: Treating Provider/Extender: Burnard Leigh in Treatment: 0 Information Obtained From Patient Chart Constitutional Symptoms (General Health) Complaints and Symptoms: Positive for: Fatigue Negative  for: Fever; Chills; Marked Weight Change Medical History: Past Medical History Notes: morbid obesity Eyes Complaints and Symptoms: Negative for: Dry Eyes; Vision Changes; Glasses / Contacts Ear/Nose/Mouth/Throat Complaints and Symptoms: Negative for: Chronic sinus problems or rhinitis Medical History: Negative for: Chronic sinus problems/congestion; Middle ear problems Past Medical History Notes: chronic cough Respiratory Complaints and Symptoms: Positive for: Chronic or frequent coughs; Shortness of Breath Medical History: Positive for: Sleep Apnea - no CPAP Past Medical History Notes: hx PNA Cardiovascular Complaints and Symptoms: Negative for: Chest pain Medical History: Positive for: Arrhythmia - afib; Congestive Heart Failure; Hypertension; Peripheral Venous Disease Gastrointestinal Complaints and Symptoms: Negative for: Frequent diarrhea; Nausea; Vomiting Medical History: Past Medical History Notes: chronic "stomach pain" Endocrine Complaints and Symptoms: Negative for: Heat/cold intolerance Medical History: Positive for: Type II Diabetes Negative for: Type I Diabetes Time with diabetes: 20 yrs Treated with: Insulin, Oral agents Blood sugar tested every day: Yes Tested : 2 times per day Genitourinary Complaints and Symptoms: Negative for: Frequent urination Medical History: Negative for: End Stage Renal Disease Integumentary (Skin) Complaints and Symptoms: Positive for: Wounds - abdominal fold Medical History: Negative for: History of Burn Musculoskeletal Complaints and Symptoms: Positive for: Muscle Weakness Negative for: Muscle Pain Medical History: Positive for: Gout; Rheumatoid Arthritis Negative for: Osteoarthritis; Osteomyelitis Neurologic Complaints and Symptoms: Positive for: Numbness/parasthesias - feet Medical History: Positive for: Neuropathy Past Medical History Notes: carpal tunnel right Psychiatric Complaints and  Symptoms: Positive for: Claustrophobia Negative for: Suicidal Medical History: Positive for: Confinement Anxiety Negative for: Anorexia/bulimia Hematologic/Lymphatic Oncologic Medical History: Negative for: Received Chemotherapy; Received Radiation Immunizations Pneumococcal Vaccine: Received Pneumococcal Vaccination: No Implantable Devices No devices added Hospitalization / Surgery History Type of Hospitalization/Surgery c-section x2 right carpal tunnel release exploratory lap, right oophrectomy salpingoophrectomy tubal ligation Family and Social History Cancer: No; Diabetes: Yes - Siblings; Heart Disease: Yes - Siblings,Maternal Grandparents; Hereditary Spherocytosis: No; Hypertension: Yes - Father,Mother; Kidney Disease: Yes - Siblings; Lung Disease: No; Seizures: No; Stroke: No; Thyroid Problems: No; Tuberculosis: No; Former smoker - quit 2015; Marital Status - Married; Alcohol Use: Never; Drug Use: Prior History - hx TCH; Caffeine Use: Daily - soda; Financial Concerns: No; Food, Clothing or Shelter Needs: No; Support System Lacking: No; Transportation Concerns: No Electronic Signature(s) Signed: 10/27/2021 9:27:46 AM By: Kalman Shan DO Signed: 10/27/2021 5:11:02 PM By: Baruch Gouty RN, BSN Entered By: Baruch Gouty on 10/27/2021 08:22:12 -------------------------------------------------------------------------------- [place] Details Patient Name: Date of Service: MCQ [name] 10/27/2021 Medical Record Number: [id number] Patient Account Number: [id number] Date of Birth/Sex: Treating RN: Nov 26, 1960 (60 y.o. 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 limited to breakdown of  skin S31.104A Unspecified open wound of abdominal wall,  left lower quadrant without penetration into peritoneal cavity, initial encounter Facility Procedures CPT4 Code: [id number] Description: 99214 - WOUND CARE VISIT-LEV 4 EST PT Modifier: Quantity: 1 Physician Procedures : CPT4 Code Description Modifier [id number] [place] PHYS LEVEL 3 NEW PT ICD-10 Diagnosis Description B37.89 Other sites of candidiasis 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

## 2021-10-27 NOTE — Progress Notes (Signed)
Kathleen Kane, Kathleen Kane ([id number]) Visit Report for 10/27/2021 Allergy List Details Patient Name: Date of Service: Kathleen Kathleen Kane, Kathleen Kane 10/27/2021 8:00 A M Medical Record Number: [id number] Patient Account Number: [id number] Date of Birth/Sex: Treating RN: April 26, 1961 (60 y.o. Kathleen Kane Primary Care Trelon Plush: [name] Other Clinician: Referring Roe Koffman: Treating Braxston Quinter/Extender: Burnard Leigh in Treatment: 0 Allergies Active Allergies ACE Inhibitors Reaction: cough Allergy Notes Electronic Signature(s) Signed: 10/27/2021 5:11:02 PM By: Baruch Gouty RN, BSN Entered By: Baruch Gouty on 10/27/2021 08:09:40 -------------------------------------------------------------------------------- Arrival Information Details Patient Name: Date of Service: Kathleen Kane. 10/27/2021 8:00 A M Medical Record Number: [id number] Patient Account Number: [id number] Date of Birth/Sex: Treating RN: Feb 03, 1961 (60 y.o. Kathleen Kane Primary Care Maddyn Lieurance: [name] Other Clinician: Referring Jakai Risse: Treating Harshika Mago/Extender: Burnard Leigh in Treatment: 0 Visit Information Patient Arrived: Wheel Chair Arrival Time: 08:02 Accompanied By: daughter Transfer Assistance: None Patient Identification Verified: Yes Secondary Verification Process Completed: Yes Patient Requires Transmission-Based Precautions: No Patient Has Alerts: No Electronic Signature(s) Signed: 10/27/2021 5:11:02 PM By: Baruch Gouty RN, BSN Entered By: Baruch Gouty on 10/27/2021 08:05:17 -------------------------------------------------------------------------------- Clinic Level of Care Assessment Details Patient Name: Date of Service: Kathleen Kathleen Kane, Kathleen Kane. 10/27/2021 8:00 A M Medical Record Number: [id number] Patient Account Number: [id number] Date of Birth/Sex: Treating RN: 10-12-1961 (60 y.o. Kathleen Kane Primary Care Jaylene Schrom: [name]  Other Clinician: Referring Hadassa Cermak: Treating Taje Littler/Extender: Burnard Leigh in Treatment: 0 Clinic Level of Care Assessment Items TOOL 2 Quantity Score []  - 0 Use when only an EandM is performed on the INITIAL visit ASSESSMENTS - Nursing Assessment / Reassessment X- 1 20 General Physical Exam (combine w/ comprehensive assessment (listed just below) when performed on new pt. evals) X- 1 25 Comprehensive Assessment (HX, ROS, Risk Assessments, Wounds Hx, etc.) ASSESSMENTS - Wound and Skin A ssessment / Reassessment []  - 0 Simple Wound Assessment / Reassessment - one wound X- 2 5 Complex Wound Assessment / Reassessment - multiple wounds []  - 0 Dermatologic / Skin Assessment (not related to wound area) ASSESSMENTS - Ostomy and/or Continence Assessment and Care []  - 0 Incontinence Assessment and Management []  - 0 Ostomy Care Assessment and Management (repouching, etc.) PROCESS - Coordination of Care X - Simple Patient / Family Education for ongoing care 1 15 []  - 0 Complex (extensive) Patient / Family Education for ongoing care X- 1 10 Staff obtains Programmer, systems, Records, T Results / Process Orders est []  - 0 Staff telephones HHA, Nursing Homes / Clarify orders / etc []  - 0 Routine Transfer to another Facility (non-emergent condition) []  - 0 Routine Hospital Admission (non-emergent condition) X- 1 15 New Admissions / Biomedical engineer / Ordering NPWT Apligraf, etc. , []  - 0 Emergency Hospital Admission (emergent condition) X- 1 10 Simple Discharge Coordination []  - 0 Complex (extensive) Discharge Coordination PROCESS - Special Needs []  - 0 Pediatric / Minor Patient Management []  - 0 Isolation Patient Management []  - 0 Hearing / Language / Visual special needs []  - 0 Assessment of Community assistance (transportation, D/C planning, etc.) []  - 0 Additional assistance / Altered mentation []  - 0 Support Surface(s) Assessment (bed, cushion,  seat, etc.) INTERVENTIONS - Wound Cleansing / Measurement X- 1 5 Wound Imaging (photographs - any number of wounds) []  - 0 Wound Tracing (instead of photographs) []  - 0 Simple Wound Measurement - one wound X- 2 5 Complex Wound Measurement - multiple wounds []  - 0 Simple Wound Cleansing - one  wound X- 2 5 Complex Wound Cleansing - multiple wounds INTERVENTIONS - Wound Dressings X - Small Wound Dressing one or multiple wounds 1 10 []  - 0 Medium Wound Dressing one or multiple wounds []  - 0 Large Wound Dressing one or multiple wounds []  - 0 Application of Medications - injection INTERVENTIONS - Miscellaneous []  - 0 External ear exam []  - 0 Specimen Collection (cultures, biopsies, blood, body fluids, etc.) []  - 0 Specimen(s) / Culture(s) sent or taken to Lab for analysis []  - 0 Patient Transfer (multiple staff / Harrel Lemon Lift / Similar devices) []  - 0 Simple Staple / Suture removal (25 or less) []  - 0 Complex Staple / Suture removal (26 or more) []  - 0 Hypo / Hyperglycemic Management (close monitor of Blood Glucose) []  - 0 Ankle / Brachial Index (ABI) - do not check if billed separately Has the patient been seen at the hospital within the last three years: Yes Total Score: 140 Level Of Care: New/Established - Level 4 Electronic Signature(s) Signed: 10/27/2021 5:11:02 PM By: Baruch Gouty RN, BSN Entered By: Baruch Gouty on 10/27/2021 11:38:53 -------------------------------------------------------------------------------- Encounter Discharge Information Details Patient Name: Date of Service: Kathleen Kane. 10/27/2021 8:00 A M Medical Record Number: [id number] Patient Account Number: [id number] Date of Birth/Sex: Treating RN: 10/18/1961 (60 y.o. Kathleen Kane Primary Care Briston Lax: [name] Other Clinician: Referring Lakie Mclouth: Treating Brittni Hult/Extender: Burnard Leigh in Treatment: 0 Encounter Discharge Information  Items Discharge Condition: Stable Ambulatory Status: Wheelchair Discharge Destination: Home Transportation: Private Auto Accompanied By: daughter Schedule Follow-up Appointment: Yes Clinical Summary of Care: Patient Declined Electronic Signature(s) Signed: 10/27/2021 5:11:02 PM By: Baruch Gouty RN, BSN Entered By: Baruch Gouty on 10/27/2021 11:40:20 -------------------------------------------------------------------------------- Lower Extremity Assessment Details Patient Name: Date of Service: Kathleen Kane. 10/27/2021 8:00 A M Medical Record Number: [id number] Patient Account Number: [id number] Date of Birth/Sex: Treating RN: [birth year] (60 y.o. Kathleen Kane Primary Care Deana Krock: [name] Other Clinician: Referring Madilyne Tadlock: Treating Dearion Huot/Extender: Burnard Leigh in Treatment: 0 Electronic Signature(s) Signed: 10/27/2021 5:11:02 PM By: Baruch Gouty RN, BSN Entered By: Baruch Gouty on 10/27/2021 08:26:10 -------------------------------------------------------------------------------- Multi Wound Chart Details Patient Name: Date of Service: Kathleen Kane. 10/27/2021 8:00 A M Medical Record Number: [id number] Patient Account Number: [id number] Date of Birth/Sex: Treating RN: 1961-03-05 (60 y.o. Helene Shoe, Meta.Reding Primary Care Daisa Stennis: [name] Other Clinician: Referring Danelle Curiale: Treating Vadis Slabach/Extender: Burnard Leigh in Treatment: 0 Vital Signs Height(in): 74 Capillary Blood Glucose(mg/dl): 157 Weight(lbs): 240 Pulse(bpm): 109 Body Mass Index(BMI): 51 Blood Pressure(mmHg): 119/80 Temperature(F): 98.5 Respiratory Rate(breaths/min): 20 Photos: [N/A:N/A] Left Abdomen - Lower Quadrant Left, Anterior Upper Leg N/A Wound Location: Gradually Appeared Gradually Appeared N/A Wounding Event: Fungal Fungal N/A Primary Etiology: Sleep Apnea, Arrhythmia, Congestive Sleep Apnea, Arrhythmia,  Congestive N/A Comorbid History: Heart Failure, Hypertension, Peripheral Heart Failure, Hypertension, Peripheral Venous Disease, Type II Diabetes, Venous Disease, Type II Diabetes, Gout, Rheumatoid Arthritis, Gout, Rheumatoid Arthritis, Neuropathy, Confinement Anxiety Neuropathy, Confinement Anxiety 09/28/2021 09/28/2021 N/A Date Acquired: 0 0 N/A Weeks of Treatment: Open Open N/A Wound Status: 0.2x0.2x0.1 0.4x0.4x0.1 N/A Measurements L x W x D (cm) 0.031 0.126 N/A A (cm) : rea 0.003 0.013 N/A Volume (cm) : 0.00% 0.00% N/A % Reduction in A rea: 0.00% 0.00% N/A % Reduction in Volume: Partial Thickness Partial Thickness N/A Classification: Small Medium N/A Exudate A mount: Serous Serous N/A Exudate Type: amber amber N/A Exudate Color: Indistinct, nonvisible Flat and Intact N/A Wound Margin:  Small (1-33%) None Present (0%) N/A Granulation A mount: Pink N/A N/A Granulation Quality: None Present (0%) None Present (0%) N/A Necrotic A mount: Fascia: No Fascia: No N/A Exposed Structures: Fat Layer (Subcutaneous Tissue): No Fat Layer (Subcutaneous Tissue): No Tendon: No Tendon: No Muscle: No Muscle: No Joint: No Joint: No Bone: No Bone: No Limited to Skin Breakdown Limited to Skin Breakdown Large (67-100%) Medium (34-66%) N/A Epithelialization: Treatment Notes Electronic Signature(s) Signed: 10/27/2021 9:27:46 AM By: Kalman Shan DO Signed: 10/27/2021 5:06:43 PM By: Deon Pilling RN, BSN Signed: 10/27/2021 5:06:43 PM By: Deon Pilling RN, BSN Entered By: Kalman Shan on 10/27/2021 09:06:53 -------------------------------------------------------------------------------- Multi-Disciplinary Care Plan Details Patient Name: Date of Service: Kathleen Kane. 10/27/2021 8:00 A M Medical Record Number: [id number] Patient Account Number: [id number] Date of Birth/Sex: Treating RN: Apr 01, 1961 (60 y.o. Kathleen Kane Primary Care Dario Yono: [name] Other Clinician: Referring Ginnette Gates: Treating Brandi Tomlinson/Extender: Burnard Leigh in Treatment: 0 Multidisciplinary Care Plan reviewed with physician Active Inactive Abuse / Safety / Falls / Self Care Management Nursing Diagnoses: History of Falls Potential for falls Goals: Patient/caregiver will verbalize/demonstrate measures taken to prevent injury and/or falls Date Initiated: 10/27/2021 Target Resolution Date: 11/24/2021 Goal Status: Active Interventions: Assess impairment of mobility on admission and as needed per policy Assess personal safety and home safety (as indicated) on admission and as needed Notes: Wound/Skin Impairment Nursing Diagnoses: Impaired tissue integrity Knowledge deficit related to ulceration/compromised skin integrity Goals: Patient/caregiver will verbalize understanding of skin care regimen Date Initiated: 10/27/2021 Target Resolution Date: 11/24/2021 Goal Status: Active Ulcer/skin breakdown will have a volume reduction of 30% by week 4 Date Initiated: 10/27/2021 Target Resolution Date: 11/24/2021 Goal Status: Active Interventions: Assess patient/caregiver ability to obtain necessary supplies Assess patient/caregiver ability to perform ulcer/skin care regimen upon admission and as needed Assess ulceration(s) every visit Provide education on ulcer and skin care Treatment Activities: Skin care regimen initiated : 10/27/2021 Topical wound management initiated : 10/27/2021 Notes: Electronic Signature(s) Signed: 10/27/2021 5:11:02 PM By: Baruch Gouty RN, BSN Entered By: Baruch Gouty on 10/27/2021 11:37:35 -------------------------------------------------------------------------------- Pain Assessment Details Patient Name: Date of Service: Kathleen Kane. 10/27/2021 8:00 A M Medical Record Number: [id number] Patient Account Number: [id number] Date of Birth/Sex: Treating RN: 08/21/1961 (60 y.o. Kathleen Kane Primary Care Reyana Leisey: [name] Other Clinician: Referring Cerra Eisenhower: Treating Matej Sappenfield/Extender: Burnard Leigh in Treatment: 0 Active Problems Location of Pain Severity and Description of Pain Patient Has Paino No Site Locations Rate the pain. Current Pain Level: 0 Pain Management and Medication Current Pain Management: Electronic Signature(s) Signed: 10/27/2021 5:11:02 PM By: Baruch Gouty RN, BSN Entered By: Baruch Gouty on 10/27/2021 08:45:28 -------------------------------------------------------------------------------- Patient/Caregiver Education Details Patient Name: Date of Service: Kathleen Kathleen Kane, Kathleen W. 12/20/2022andnbsp8:00 A M Medical Record Number: [id number] Patient Account Number: [id number] Date of Birth/Gender: Treating RN: 22-Dec-1960 (60 y.o. Kathleen Kane Primary Care Physician: [name] Other Clinician: Referring Physician: Treating Physician/Extender: Burnard Leigh in Treatment: 0 Education Assessment Education Provided To: Patient Education Topics Provided Wound/Skin Impairment: Methods: Explain/Verbal Responses: Reinforcements needed, State content correctly Electronic Signature(s) Signed: 10/27/2021 5:11:02 PM By: Baruch Gouty RN, BSN Entered By: Baruch Gouty on 10/27/2021 11:37:49 -------------------------------------------------------------------------------- Wound Assessment Details Patient Name: Date of Service: Kathleen Kane. 10/27/2021 8:00 A M Medical Record Number: [id number] Patient Account Number: [id number] Date of Birth/Sex: Treating RN: 03/07/1961 (60 y.o. Kathleen Kane Primary Care Teena Mangus: [name] Other Clinician: Referring Kaytlin Burklow: Treating Elisavet Buehrer/Extender: Heber Somerset,  Vevelyn Royals, Mario Weeks in Treatment: 0 Wound Status Wound Number: 1 Primary Fungal Etiology: Wound Location: Left Abdomen - Lower Quadrant Wound Open Wounding  Event: Gradually Appeared Status: Date Acquired: 09/28/2021 Comorbid Sleep Apnea, Arrhythmia, Congestive Heart Failure, Hypertension, Weeks Of Treatment: 0 History: Peripheral Venous Disease, Type II Diabetes, Gout, Rheumatoid Clustered Wound: No Arthritis, Neuropathy, Confinement Anxiety Photos Wound Measurements Length: (cm) 0.2 Width: (cm) 0.2 Depth: (cm) 0.1 Area: (cm) 0.031 Volume: (cm) 0.003 % Reduction in Area: 0% % Reduction in Volume: 0% Epithelialization: Large (67-100%) Tunneling: No Undermining: No Wound Description Classification: Partial Thickness Wound Margin: Indistinct, nonvisible Exudate Amount: Small Exudate Type: Serous Exudate Color: amber Foul Odor After Cleansing: No Slough/Fibrino No Wound Bed Granulation Amount: Small (1-33%) Exposed Structure Granulation Quality: Pink Fascia Exposed: No Necrotic Amount: None Present (0%) Fat Layer (Subcutaneous Tissue) Exposed: No Tendon Exposed: No Muscle Exposed: No Joint Exposed: No Bone Exposed: No Limited to Skin Breakdown Treatment Notes Wound #1 (Abdomen - Lower Quadrant) Wound Laterality: Left Cleanser Soap and Water Discharge Instruction: May shower and wash wound with dial antibacterial soap and water prior to dressing change. Peri-Wound Care Topical Primary Dressing interdry Discharge Instruction: to abdominal skin fold daily Secondary Dressing Secured With Compression Wrap Compression Stockings Add-Ons Electronic Signature(s) Signed: 10/27/2021 5:11:02 PM By: Baruch Gouty RN, BSN Entered By: Baruch Gouty on 10/27/2021 08:43:33 -------------------------------------------------------------------------------- Wound Assessment Details Patient Name: Date of Service: Kathleen Kane. 10/27/2021 8:00 A M Medical Record Number: [id number] Patient Account Number: [id number] Date of Birth/Sex: Treating RN: 1961/05/11 (60 y.o. Kathleen Kane Primary Care Nanda Bittick: [name] Other Clinician: Referring Jaydien Panepinto: Treating Melvina Pangelinan/Extender: Burnard Leigh in Treatment: 0 Wound Status Wound Number: 2 Primary Fungal Etiology: Wound Location: Left, Anterior Upper Leg Wound Open Wounding Event: Gradually Appeared Status: Date Acquired: 09/28/2021 Comorbid Sleep Apnea, Arrhythmia, Congestive Heart Failure, Hypertension, Weeks Of Treatment: 0 History: Peripheral Venous Disease, Type II Diabetes, Gout, Rheumatoid Clustered Wound: No Arthritis, Neuropathy, Confinement Anxiety Photos Wound Measurements Length: (cm) 0.4 Width: (cm) 0.4 Depth: (cm) 0.1 Area: (cm) 0.126 Volume: (cm) 0.013 % Reduction in Area: 0% % Reduction in Volume: 0% Epithelialization: Medium (34-66%) Tunneling: No Undermining: No Wound Description Classification: Partial Thickness Wound Margin: Flat and Intact Exudate Amount: Medium Exudate Type: Serous Exudate Color: amber Wound Bed Granulation Amount: None Present (0%) Necrotic Amount: None Present (0%) Foul Odor After Cleansing: No Slough/Fibrino No Exposed Structure Fascia Exposed: No Fat Layer (Subcutaneous Tissue) Exposed: No Tendon Exposed: No Muscle Exposed: No Joint Exposed: No Bone Exposed: No Limited to Skin Breakdown Treatment Notes Wound #2 (Upper Leg) Wound Laterality: Left, Anterior Cleanser Soap and Water Discharge Instruction: May shower and wash wound with dial antibacterial soap and water prior to dressing change. Peri-Wound Care Topical Primary Dressing interdry Discharge Instruction: to abdominal skin fold daily Secondary Dressing Secured With Compression Wrap Compression Stockings Add-Ons Electronic Signature(s) Signed: 10/27/2021 5:11:02 PM By: Baruch Gouty RN, BSN Entered By: Baruch Gouty on 10/27/2021 08:44:54 -------------------------------------------------------------------------------- [place] Details Patient Name: Date of Service: Kathleen Kane. 10/27/2021 8:00 A M Medical Record Number: [id number] Patient Account Number: [id number] Date of Birth/Sex: Treating RN: 06-19-1961 (60 y.o. Kathleen Kane Primary Care Aylee Littrell: [name] Other Clinician: Referring Monique Gift: Treating Johnathan Heskett/Extender: Burnard Leigh in Treatment: 0 Vital Signs Time Taken: 08:05 Temperature (F): 98.5 Height (in): 62 Pulse (bpm): 109 Source: Stated Respiratory Rate (breaths/min): 20 Weight (lbs): 240 Blood Pressure (mmHg): 119/80 Source: Stated Capillary Blood Glucose (  mg/dl): 157 Body Mass Index (BMI): 43.9 Reference Range: 80 - 120 mg / dl Notes glucose per pt report yesterday Electronic Signature(s) Signed: 10/27/2021 5:11:02 PM By: Baruch Gouty RN, BSN Entered By: Baruch Gouty on 10/27/2021 08:09:02

## 2021-11-04 ENCOUNTER — Other Ambulatory Visit: Payer: Medicaid Other

## 2021-11-05 ENCOUNTER — Other Ambulatory Visit: Payer: Self-pay | Admitting: Rheumatology

## 2021-11-05 NOTE — Telephone Encounter (Signed)
Next Visit: 01/20/2022  Last Visit: 10/21/2021  Last Fill: 05/18/2021  Dx: Rheumatoid arthritis with rheumatoid factor of multiple sites without organ or systems involvement  Current Dose per office note on 10/21/2021: prednisone 5mg  po qd  Okay to refill Prednisone?

## 2021-11-10 ENCOUNTER — Encounter (HOSPITAL_BASED_OUTPATIENT_CLINIC_OR_DEPARTMENT_OTHER): Payer: Medicaid Other | Attending: Internal Medicine | Admitting: Internal Medicine

## 2021-11-10 DIAGNOSIS — E1165 Type 2 diabetes mellitus with hyperglycemia: Secondary | ICD-10-CM | POA: Insufficient documentation

## 2021-11-10 DIAGNOSIS — S31104A Unspecified open wound of abdominal wall, left lower quadrant without penetration into peritoneal cavity, initial encounter: Secondary | ICD-10-CM | POA: Insufficient documentation

## 2021-11-10 DIAGNOSIS — Z7984 Long term (current) use of oral hypoglycemic drugs: Secondary | ICD-10-CM | POA: Insufficient documentation

## 2021-11-10 DIAGNOSIS — M069 Rheumatoid arthritis, unspecified: Secondary | ICD-10-CM | POA: Insufficient documentation

## 2021-11-10 DIAGNOSIS — X58XXXA Exposure to other specified factors, initial encounter: Secondary | ICD-10-CM | POA: Insufficient documentation

## 2021-11-10 DIAGNOSIS — L98491 Non-pressure chronic ulcer of skin of other sites limited to breakdown of skin: Secondary | ICD-10-CM | POA: Insufficient documentation

## 2021-11-10 DIAGNOSIS — B3789 Other sites of candidiasis: Secondary | ICD-10-CM | POA: Insufficient documentation

## 2021-11-13 ENCOUNTER — Telehealth: Payer: Self-pay | Admitting: Rheumatology

## 2021-11-13 NOTE — Telephone Encounter (Signed)
[facility] left a voicemail in regards to the patient. They stated they received a refill request from her on her Orencia and they have been unable to contact her. They state they aren't able to reach her to tell her she needs to update labs in order to get more refills.

## 2021-11-13 NOTE — Telephone Encounter (Signed)
Spoke with patient and she states she has not been on her Orencia or Rasuvo for about 3 weeks because she has had Pneumonia. Patient states she has had 3 rounds of antibiotics. Patient states she has not followed up with PCP for clearance. Patient advised she should see her PCP for clearance and to have them send [place] a letter letting us know the Pneumonia has resolved. Patient expressed understanding.

## 2021-11-17 ENCOUNTER — Other Ambulatory Visit: Payer: Self-pay

## 2021-11-17 ENCOUNTER — Encounter (HOSPITAL_BASED_OUTPATIENT_CLINIC_OR_DEPARTMENT_OTHER): Payer: Medicaid Other | Admitting: Internal Medicine

## 2021-11-17 DIAGNOSIS — M069 Rheumatoid arthritis, unspecified: Secondary | ICD-10-CM | POA: Diagnosis not present

## 2021-11-17 DIAGNOSIS — B3789 Other sites of candidiasis: Secondary | ICD-10-CM

## 2021-11-17 DIAGNOSIS — S31104A Unspecified open wound of abdominal wall, left lower quadrant without penetration into peritoneal cavity, initial encounter: Secondary | ICD-10-CM | POA: Diagnosis not present

## 2021-11-17 DIAGNOSIS — E1165 Type 2 diabetes mellitus with hyperglycemia: Secondary | ICD-10-CM | POA: Diagnosis not present

## 2021-11-17 DIAGNOSIS — X58XXXA Exposure to other specified factors, initial encounter: Secondary | ICD-10-CM | POA: Diagnosis not present

## 2021-11-17 DIAGNOSIS — L98491 Non-pressure chronic ulcer of skin of other sites limited to breakdown of skin: Secondary | ICD-10-CM

## 2021-11-17 DIAGNOSIS — Z7984 Long term (current) use of oral hypoglycemic drugs: Secondary | ICD-10-CM | POA: Diagnosis not present

## 2021-11-17 NOTE — Progress Notes (Signed)
Kathleen Kane, Kathleen Kane ([id number]) Visit Report for 11/17/2021 Arrival Information Details Patient Name: Date of Service: Kathleen Kane 11/17/2021 2:30 PM Medical Record Number: [id number] Patient Account Number: [id number] Date of Birth/Sex: Treating RN: Jul 16, 1961 (61 y.o. Kathleen Kane, Kathleen Kane Primary Care Kathleen Kane: Kathleen Kane Other Clinician: Referring [name]: Treating [name]/Extender: Kathleen Kane in Treatment: 3 Visit Information History Since Last Visit Added or deleted any medications: No Patient Arrived: Wheel Chair Any new allergies or adverse reactions: No Arrival Time: 14:21 Had a fall or experienced change in No Accompanied By: spouse activities of daily living that may affect Transfer Assistance: None risk of falls: Patient Identification Verified: Yes Signs or symptoms of abuse/neglect since last visito No Secondary Verification Process Completed: Yes Hospitalized since last visit: No Patient Requires Transmission-Based Precautions: No Implantable device outside of the clinic excluding No Patient Has Alerts: No cellular tissue based products placed in the center since last visit: Has Dressing in Place as Prescribed: Yes Pain Present Now: No Electronic Signature(s) Signed: 11/17/2021 5:43:46 PM By: Baruch Gouty RN, BSN Entered By: Baruch Gouty on 11/17/2021 14:24:37 -------------------------------------------------------------------------------- Clinic Level of Care Assessment Details Patient Name: Date of Service: Kathleen Kane 11/17/2021 2:30 PM Medical Record Number: [id number] Patient Account Number: [id number] Date of Birth/Sex: Treating RN: [birth year] (61 y.o. Kathleen Kane: Kathleen Kane Other Clinician: Referring Kathleen Kane: Treating Kathleen Kane/Extender: Kathleen Kane in Treatment: 3 Clinic Level of Care Assessment Items TOOL 4 Quantity Score []  - 0 Use when only  an EandM is performed on FOLLOW-UP visit ASSESSMENTS - Nursing Assessment / Reassessment X- 1 10 Reassessment of Co-morbidities (includes updates in patient status) X- 1 5 Reassessment of Adherence to Treatment Plan ASSESSMENTS - Wound and Skin A ssessment / Reassessment []  - 0 Simple Wound Assessment / Reassessment - one wound X- 2 5 Complex Wound Assessment / Reassessment - multiple wounds []  - 0 Dermatologic / Skin Assessment (not related to wound area) ASSESSMENTS - Focused Assessment []  - 0 Circumferential Edema Measurements - multi extremities []  - 0 Nutritional Assessment / Counseling / Intervention []  - 0 Lower Extremity Assessment (monofilament, tuning fork, pulses) []  - 0 Peripheral Arterial Disease Assessment (using hand held doppler) ASSESSMENTS - Ostomy and/or Continence Assessment and Care []  - 0 Incontinence Assessment and Management []  - 0 Ostomy Care Assessment and Management (repouching, etc.) PROCESS - Coordination of Care X - Simple Patient / Family Education for ongoing care 1 15 []  - 0 Complex (extensive) Patient / Family Education for ongoing care X- 1 10 Staff obtains Programmer, systems, Records, T Results / Process Orders est []  - 0 Staff telephones HHA, Nursing Homes / Clarify orders / etc []  - 0 Routine Transfer to another Facility (non-emergent condition) []  - 0 Routine Hospital Admission (non-emergent condition) []  - 0 New Admissions / Biomedical engineer / Ordering NPWT Apligraf, etc. , []  - 0 Emergency Hospital Admission (emergent condition) X- 1 10 Simple Discharge Coordination []  - 0 Complex (extensive) Discharge Coordination PROCESS - Special Needs []  - 0 Pediatric / Minor Patient Management []  - 0 Isolation Patient Management []  - 0 Hearing / Language / Visual special needs []  - 0 Assessment of Community assistance (transportation, D/C planning, etc.) []  - 0 Additional assistance / Altered mentation []  - 0 Support Surface(s)  Assessment (bed, cushion, seat, etc.) INTERVENTIONS - Wound Cleansing / Measurement []  - 0 Simple Wound Cleansing - one wound X- 2 5 Complex Wound Cleansing - multiple wounds X-  1 5 Wound Imaging (photographs - any number of wounds) []  - 0 Wound Tracing (instead of photographs) []  - 0 Simple Wound Measurement - one wound []  - 0 Complex Wound Measurement - multiple wounds INTERVENTIONS - Wound Dressings []  - 0 Small Wound Dressing one or multiple wounds []  - 0 Medium Wound Dressing one or multiple wounds []  - 0 Large Wound Dressing one or multiple wounds []  - 0 Application of Medications - topical []  - 0 Application of Medications - injection INTERVENTIONS - Miscellaneous []  - 0 External ear exam []  - 0 Specimen Collection (cultures, biopsies, blood, body fluids, etc.) []  - 0 Specimen(s) / Culture(s) sent or taken to Lab for analysis []  - 0 Patient Transfer (multiple staff / Civil Service fast streamer / Similar devices) []  - 0 Simple Staple / Suture removal (25 or less) []  - 0 Complex Staple / Suture removal (26 or more) []  - 0 Hypo / Hyperglycemic Management (close monitor of Blood Glucose) []  - 0 Ankle / Brachial Index (ABI) - do not check if billed separately X- 1 5 Vital Signs Has the patient been seen at the hospital within the last three years: Yes Total Score: 80 Level Of Care: New/Established - Level 3 Electronic Signature(s) Signed: 11/17/2021 5:43:46 PM By: Baruch Gouty RN, BSN Entered By: Baruch Gouty on 11/17/2021 14:45:19 -------------------------------------------------------------------------------- Encounter Discharge Information Details Patient Name: Date of Service: Kathleen Kathleen Kane. 11/17/2021 2:30 PM Medical Record Number: [id number] Patient Account Number: [id number] Date of Birth/Sex: Treating RN: 03-23-1961 (61 y.o. Kathleen Kane Primary Care Kathleen Kane: Kathleen Kane Other Clinician: Referring Davionte Lusby: Treating Kathleen Kane/Extender: Kathleen Kane in Treatment: 3 Encounter Discharge Information Items Discharge Condition: Stable Ambulatory Status: Wheelchair Discharge Destination: Home Transportation: Private Auto Accompanied By: spouse Schedule Follow-up Appointment: Yes Clinical Summary of Care: Patient Declined Electronic Signature(s) Signed: 11/17/2021 5:43:46 PM By: Baruch Gouty RN, BSN Entered By: Baruch Gouty on 11/17/2021 15:00:51 -------------------------------------------------------------------------------- Multi Wound Chart Details Patient Name: Date of Service: Kathleen Kathleen Kane. 11/17/2021 2:30 PM Medical Record Number: [id number] Patient Account Number: [id number] Date of Birth/Sex: Treating RN: 1961-07-18 (61 y.o. Nancy Fetter Primary Care Naliah Eddington: Kathleen Kane Other Clinician: Referring Sinead Hockman: Treating Carey Lafon/Extender: Kathleen Kane in Treatment: 3 Vital Signs Height(in): 62 Capillary Blood Glucose(mg/dl): 155 Weight(lbs): 240 Pulse(bpm): 91 Body Mass Index(BMI): 54 Blood Pressure(mmHg): 154/88 Temperature(F): 98.7 Respiratory Rate(breaths/min): 20 Photos: [1:Left Abdomen - Lower Quadrant] [2:Left, Anterior Upper Leg] [N/A:N/A N/A] Wound Location: [1:Gradually Appeared] [2:Gradually Appeared] [N/A:N/A] Wounding Event: [1:Fungal] [2:Fungal] [N/A:N/A] Primary Etiology: [1:Sleep Apnea, Arrhythmia, Congestive Sleep Apnea, Arrhythmia, Congestive N/A] Comorbid History: [1:Heart Failure, Hypertension, Peripheral Heart Failure, Hypertension, Peripheral Venous Disease, Type II Diabetes, Gout, Rheumatoid Arthritis, Neuropathy, Confinement Anxiety 09/28/2021] [2:Venous Disease, Type II Diabetes, Gout,  Rheumatoid Arthritis, Neuropathy, Confinement Anxiety 09/28/2021] [N/A:N/A] Date Acquired: [1:3] [2:3] [N/A:N/A] Weeks of Treatment: [1:Healed - Epithelialized] [2:Healed - Epithelialized] [N/A:N/A] Wound Status: [1:0x0x0] [2:0x0x0]  [N/A:N/A] Measurements L x W x D (cm) [1:0] [2:0] [N/A:N/A] A (cm) : rea [1:0] [2:0] [N/A:N/A] Volume (cm) : [1:100.00%] [2:100.00%] [N/A:N/A] % Reduction in A rea: [1:100.00%] [2:100.00%] [N/A:N/A] % Reduction in Volume: [1:Partial Thickness] [2:Partial Thickness] [N/A:N/A] Classification: [1:None Present] [2:None Present] [N/A:N/A] Exudate A mount: [1:None Present (0%)] [2:None Present (0%)] [N/A:N/A] Granulation A mount: [1:None Present (0%)] [2:None Present (0%)] [N/A:N/A] Necrotic A mount: [1:Fascia: No] [2:Fascia: No] [N/A:N/A] Exposed Structures: [1:Fat Layer (Subcutaneous Tissue): No Fat Layer (Subcutaneous Tissue): No Tendon: No Muscle: No Joint: No Bone: No Large (67-100%)] [2:Tendon: No  Muscle: No Joint: No Bone: No Large (67-100%)] [N/A:N/A] Treatment Notes Electronic Signature(s) Signed: 11/17/2021 3:37:01 PM By: Kalman Shan DO Signed: 11/17/2021 5:00:37 PM By: Levan Hurst RN, BSN Entered By: Kalman Shan on 11/17/2021 15:32:51 -------------------------------------------------------------------------------- Multi-Disciplinary Care Plan Details Patient Name: Date of Service: Kathleen Kathleen Kane. 11/17/2021 2:30 PM Medical Record Number: [id number] Patient Account Number: [id number] Date of Birth/Sex: Treating RN: 24-Jul-1961 (61 y.o. Kathleen Kane Primary Care Amberleigh Gerken: Kathleen Kane Other Clinician: Referring Allyiah Gartner: Treating Shloima Clinch/Extender: Kathleen Kane in Treatment: 3 Multidisciplinary Care Plan reviewed with physician Active Inactive Electronic Signature(s) Signed: 11/17/2021 5:43:46 PM By: Baruch Gouty RN, BSN Entered By: Baruch Gouty on 11/17/2021 14:34:49 -------------------------------------------------------------------------------- Pain Assessment Details Patient Name: Date of Service: Kathleen Kathleen Kane. 11/17/2021 2:30 PM Medical Record Number: [id number] Patient Account Number: [id number] Date of  Birth/Sex: Treating RN: [birth year] (61 y.o. Kathleen Kane Primary Care Deboraha Goar: Kathleen Kane Other Clinician: Referring Braden Cimo: Treating Charles Niese/Extender: Kathleen Kane in Treatment: 3 Active Problems Location of Pain Severity and Description of Pain Patient Has Paino No Site Locations Rate the pain. Current Pain Level: 0 Pain Management and Medication Current Pain Management: Electronic Signature(s) Signed: 11/17/2021 5:43:46 PM By: Baruch Gouty RN, BSN Entered By: Baruch Gouty on 11/17/2021 14:26:59 -------------------------------------------------------------------------------- Patient/Caregiver Education Details Patient Name: Date of Service: Kathleen Kathleen Kane 1/10/2023andnbsp2:30 PM Medical Record Number: [id number] Patient Account Number: [id number] Date of Birth/Gender: Treating RN: [birth year] (60 y.o. Kathleen Kane Primary Care Physician: Kathleen Kane Other Clinician: Referring Physician: Treating Physician/Extender: Kathleen Kane in Treatment: 3 Education Assessment Education Provided To: Patient Education Topics Provided Wound/Skin Impairment: Methods: Explain/Verbal Responses: Reinforcements needed, State content correctly Electronic Signature(s) Signed: 11/17/2021 5:43:46 PM By: Baruch Gouty RN, BSN Entered By: Baruch Gouty on 11/17/2021 14:35:18 -------------------------------------------------------------------------------- Wound Assessment Details Patient Name: Date of Service: Kathleen Kathleen Kane. 11/17/2021 2:30 PM Medical Record Number: [id number] Patient Account Number: [id number] Date of Birth/Sex: Treating RN: January 01, 1961 (60 y.o. Kathleen Kane Primary Care Chalsey Leeth: Kathleen Kane Other Clinician: Referring Yuvraj Pfeifer: Treating Jadien Lehigh/Extender: Kathleen Kane in Treatment: 3 Wound Status Wound Number: 1 Primary Fungal Etiology: Wound  Location: Left Abdomen - Lower Quadrant Wound Healed - Epithelialized Wounding Event: Gradually Appeared Status: Date Acquired: 09/28/2021 Comorbid Sleep Apnea, Arrhythmia, Congestive Heart Failure, Hypertension, Weeks Of Treatment: 3 History: Peripheral Venous Disease, Type II Diabetes, Gout, Rheumatoid Clustered Wound: No Arthritis, Neuropathy, Confinement Anxiety Photos Wound Measurements Length: (cm) Width: (cm) Depth: (cm) Area: (cm) Volume: (cm) 0 % Reduction in Area: 100% 0 % Reduction in Volume: 100% 0 Epithelialization: Large (67-100%) 0 Tunneling: No 0 Undermining: No Wound Description Classification: Partial Thickness Exudate Amount: None Present Foul Odor After Cleansing: No Slough/Fibrino No Wound Bed Granulation Amount: None Present (0%) Exposed Structure Necrotic Amount: None Present (0%) Fascia Exposed: No Fat Layer (Subcutaneous Tissue) Exposed: No Tendon Exposed: No Muscle Exposed: No Joint Exposed: No Bone Exposed: No Electronic Signature(s) Signed: 11/17/2021 5:43:46 PM By: Baruch Gouty RN, BSN Entered By: Baruch Gouty on 11/17/2021 14:32:57 -------------------------------------------------------------------------------- Wound Assessment Details Patient Name: Date of Service: Kathleen Kathleen Kane. 11/17/2021 2:30 PM Medical Record Number: [id number] Patient Account Number: [id number] Date of Birth/Sex: Treating RN: 04/14/1961 (61 y.o. Kathleen Kane Primary Care Makayle Krahn: Kathleen Kane Other Clinician: Referring Anjelique Makar: Treating Waunita Sandstrom/Extender: Kathleen Kane in Treatment: 3 Wound Status Wound Number: 2 Primary Fungal Etiology: Wound Location: Left, Anterior Upper Leg Wound Healed - Epithelialized Wounding Event: Gradually  Appeared Status: Date Acquired: 09/28/2021 Comorbid Sleep Apnea, Arrhythmia, Congestive Heart Failure, Hypertension, Weeks Of Treatment: 3 History: Peripheral Venous Disease, Type  II Diabetes, Gout, Rheumatoid Clustered Wound: No Arthritis, Neuropathy, Confinement Anxiety Photos Wound Measurements Length: (cm) Width: (cm) Depth: (cm) Area: (cm) Volume: (cm) 0 % Reduction in Area: 100% 0 % Reduction in Volume: 100% 0 Epithelialization: Large (67-100%) 0 Tunneling: No 0 Undermining: No Wound Description Classification: Partial Thickness Exudate Amount: None Present Foul Odor After Cleansing: No Slough/Fibrino No Wound Bed Granulation Amount: None Present (0%) Exposed Structure Necrotic Amount: None Present (0%) Fascia Exposed: No Fat Layer (Subcutaneous Tissue) Exposed: No Tendon Exposed: No Muscle Exposed: No Joint Exposed: No Bone Exposed: No Electronic Signature(s) Signed: 11/17/2021 5:43:46 PM By: Baruch Gouty RN, BSN Entered By: Baruch Gouty on 11/17/2021 14:34:07 -------------------------------------------------------------------------------- [place] Details Patient Name: Date of Service: Kathleen Kathleen Kane. 11/17/2021 2:30 PM Medical Record Number: [id number] Patient Account Number: [id number] Date of Birth/Sex: Treating RN: 1961/07/05 (61 y.o. Kathleen Kane Primary Care Shail Urbas: Kathleen Kane Other Clinician: Referring Jasani Lengel: Treating Jolana Runkles/Extender: Kathleen Kane in Treatment: 3 Vital Signs Time Taken: 14:24 Temperature (F): 98.7 Height (in): 62 Pulse (bpm): 91 Source: Stated Respiratory Rate (breaths/min): 20 Weight (lbs): 240 Blood Pressure (mmHg): 154/88 Source: Stated Capillary Blood Glucose (mg/dl): 155 Body Mass Index (BMI): 43.9 Reference Range: 80 - 120 mg / dl Notes glucose per pt report yesterday Electronic Signature(s) Signed: 11/17/2021 5:43:46 PM By: Baruch Gouty RN, BSN Entered By: Baruch Gouty on 11/17/2021 14:26:50

## 2021-11-18 NOTE — Progress Notes (Signed)
Kathleen, Kane ([id number]) Visit Report for 11/17/2021 Chief Complaint Document Details Patient Name: Date of Service: Kathleen Kane, Kathleen Kane 11/17/2021 2:30 PM Medical Record Number: [id number] Patient Account Number: [id number] Date of Birth/Sex: Treating RN: [birth year] (61 y.o. Nancy Fetter Primary Care Provider: [name] Other Clinician: Referring Provider: Treating Provider/Extender: [name] in Treatment: 3 Information Obtained from: Patient Chief Complaint Candidiasis of the skin of the pannus Electronic Signature(s) Signed: 11/17/2021 3:37:01 PM By: Kalman Shan DO Entered By: Kalman Shan on 11/17/2021 15:33:00 -------------------------------------------------------------------------------- HPI Details Patient Name: Date of Service: Kathleen Kathleen Kane. 11/17/2021 2:30 PM Medical Record Number: [id number] Patient Account Number: [id number] Date of Birth/Sex: Treating RN: July 28, 1961 (61 y.o. Nancy Fetter Primary Care Provider: [name] Other Clinician: Referring Provider: Treating Provider/Extender: [name] in Treatment: 3 History of Present Illness HPI Description: Admission 10/27/2021 Kathleen Kane is a 61 year old female with a past medical history of uncontrolled type 2 diabetes on oral agents with last hemoglobin A1c of 9.2, and rheumatoid arthritis that presents to the clinic for healing wounds to her abdomen and left upper thigh. She states she developed symptoms of itching and rash about 1-1/2 months ago. On 10/21/2021 she visited urgent care. She was placed on fluconazole 150 mg once weekly for 4 weeks and Keflex. She reports significant improvement on the medications. She has been keeping the area clean clean and dry. He currently denies signs of infection. 1/10; patient presents for follow-up. She reports that her wounds have completely healed. She has been trying to keep the area  dry with pads. Electronic Signature(s) Signed: 11/17/2021 3:37:01 PM By: Kalman Shan DO Entered By: Kalman Shan on 11/17/2021 15:33:37 -------------------------------------------------------------------------------- Physical Exam Details Patient Name: Date of Service: Kathleen Kathleen, Kane 11/17/2021 2:30 PM Medical Record Number: [id number] Patient Account Number: [id number] Date of Birth/Sex: Treating RN: 1961/10/08 (61 y.o. Nancy Fetter Primary Care Provider: Other Clinician: [name] Referring Provider: Treating Provider/Extender: [name] in Treatment: 3 Constitutional respirations regular, non-labored and within target range for patient.Marland Kitchen Psychiatric pleasant and cooperative. Notes Epithelialization to the previous wound sites of the left lower abdomen and left upper thigh. No signs of active infection. Electronic Signature(s) Signed: 11/17/2021 3:37:01 PM By: Kalman Shan DO Entered By: Kalman Shan on 11/17/2021 15:34:08 -------------------------------------------------------------------------------- Physician Orders Details Patient Name: Date of Service: Kathleen Kathleen Kane. 11/17/2021 2:30 PM Medical Record Number: [id number] Patient Account Number: [id number] Date of Birth/Sex: Treating RN: 1961/03/01 (61 y.o. [name] Provider: [name] Other Clinician: Referring Provider: Treating Provider/Extender: [name] in Treatment: 3 Verbal / Phone Orders: No Diagnosis Coding Discharge From [facility] Services Discharge from [place] Bathing/ Shower/ Hygiene May shower and wash wound with soap and water. - be sure to completely dry abdominal skin fold Non Wound Condition Other Non Wound Condition Orders/Instructions: - keep clean dry cloth in abdominal skin fold, may use baby powder in skin folds to keep dry Electronic Signature(s) Signed: 11/17/2021 3:37:01 PM By:  Kalman Shan DO Entered By: Kalman Shan on 11/17/2021 15:34:19 -------------------------------------------------------------------------------- Problem List Details Patient Name: Date of Service: Kathleen Kathleen Kane. 11/17/2021 2:30 PM Medical Record Number: [id number] Patient Account Number: [id number] Date of Birth/Sex: Treating RN: April 20, 1961 (61 y.o. 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 11/17/2021 2:30 PM Medical Record Number: [id number] Patient Account Number: [id number] Date of Birth/Sex: Treating RN: Jun 10, 1961 (61 y.o. Nancy Fetter Primary Care Provider: [name] Other Clinician: Referring Provider: Treating Provider/Extender: [name] in Treatment: 3 Subjective Chief Complaint Information obtained from Patient Candidiasis of the skin of the pannus History of Present Illness (HPI) Admission 10/27/2021 Kathleen Kane is a 61 year old female with a past medical history of uncontrolled type 2 diabetes on oral agents with last hemoglobin A1c of 9.2, and rheumatoid arthritis that presents to the  clinic for healing wounds to her abdomen and left upper thigh. She states she developed symptoms of itching and rash about 1-1/2 months ago. On 10/21/2021 she visited urgent care. She was placed on fluconazole 150 mg once weekly for 4 weeks and Keflex. She reports significant improvement on the medications. She has been keeping the area clean clean and dry. He currently denies signs of infection. 1/10; patient presents for follow-up. She reports that her wounds have completely healed. She has been trying to keep the area dry with pads. Patient History Information obtained from Patient, Chart. Family History Diabetes - Siblings, Heart Disease - Siblings,Maternal Grandparents, Hypertension - Father,Mother, Kidney Disease - Siblings, No family history of Cancer, Hereditary Spherocytosis, Lung Disease, Seizures, Stroke, Thyroid Problems, Tuberculosis. Social History Former smoker - quit 2015, Marital Status - Married, Alcohol Use - Never, Drug Use - Prior History - hx TCH, Caffeine Use - Daily - soda. Medical History Ear/Nose/Mouth/Throat Denies history of Chronic sinus problems/congestion, Middle ear problems Respiratory Patient has history of Sleep Apnea - no CPAP Cardiovascular Patient has history of Arrhythmia - afib, Congestive Heart Failure, Hypertension, Peripheral Venous Disease Endocrine Patient has history of Type II Diabetes Denies history of Type I Diabetes Genitourinary Denies history of End Stage Renal Disease Integumentary (Skin) Denies history of History of Burn Musculoskeletal Patient has history of Gout, Rheumatoid Arthritis Denies history of Osteoarthritis, Osteomyelitis Neurologic Patient has history of Neuropathy Oncologic Denies history of Received Chemotherapy, Received Radiation Psychiatric Patient has history of Confinement Anxiety Denies history of Anorexia/bulimia Hospitalization/Surgery History - c-section x2. - right carpal tunnel release. - exploratory  lap, right oophrectomy. - salpingoophrectomy. - tubal ligation. Medical A Surgical History Notes nd Constitutional Symptoms (General Health) morbid obesity Ear/Nose/Mouth/Throat chronic cough Respiratory hx PNA Gastrointestinal chronic "stomach pain" Neurologic carpal tunnel right Objective Constitutional respirations regular, non-labored and within target range for patient.. Vitals Time Taken: 2:24 PM, Height: 62 in, Source: Stated, Weight: 240 lbs, Source: Stated, BMI: 43.9, Temperature: 98.7 F, Pulse: 91 bpm, Respiratory Rate: 20 breaths/min, Blood Pressure: 154/88 mmHg, Capillary Blood Glucose: 155 mg/dl. General Notes: glucose per pt report yesterday Psychiatric pleasant and cooperative. General Notes: Epithelialization to the previous wound sites of the left lower abdomen and left upper thigh. No signs of active infection. Integumentary (Hair, Skin) Wound #1 status is Healed - Epithelialized. Original cause of wound was Gradually Appeared. The date acquired was: 09/28/2021. The wound has been in treatment 3 weeks. The wound is located  on the Left Abdomen - Lower Quadrant. The wound measures 0cm length x 0cm width x 0cm depth; 0cm^2 area and 0cm^3 volume. There is no tunneling or undermining noted. There is a none present amount of drainage noted. There is no granulation within the wound bed. There is no necrotic tissue within the wound bed. Wound #2 status is Healed - Epithelialized. Original cause of wound was Gradually Appeared. The date acquired was: 09/28/2021. The wound has been in treatment 3 weeks. The wound is located on the Left,Anterior Upper Leg. The wound measures 0cm length x 0cm width x 0cm depth; 0cm^2 area and 0cm^3 volume. There is no tunneling or undermining noted. There is a none present amount of drainage noted. There is no granulation within the wound bed. There is no necrotic tissue within the wound bed. Assessment Active Problems ICD-10 Other sites of  candidiasis Non-pressure chronic ulcer of skin of other sites limited to breakdown of skin Unspecified open wound of abdominal wall, left lower quadrant without penetration into peritoneal cavity, initial encounter Patient's wounds have healed. I recommended continuing to keep the skin folds dry to avoid skin breakdown due to moisture. She may follow-up as needed. Plan Discharge From [facility] Services: Discharge from [place] Bathing/ Shower/ Hygiene: May shower and wash wound with soap and water. - be sure to completely dry abdominal skin fold Non Wound Condition: Other Non Wound Condition Orders/Instructions: - keep clean dry cloth in abdominal skin fold, may use baby powder in skin folds to keep dry 1. Discharge from wound care center due to closed wound 2. Follow-up as needed Electronic Signature(s) Signed: 11/17/2021 3:37:01 PM By: Kalman Shan DO Entered By: Kalman Shan on 11/17/2021 15:35:29 -------------------------------------------------------------------------------- HxROS Details Patient Name: Date of Service: Kathleen Kathleen Kane. 11/17/2021 2:30 PM Medical Record Number: [id number] Patient Account Number: [id number] Date of Birth/Sex: Treating RN: 1961/09/23 (61 y.o. Nancy Fetter Primary Care Provider: [name] Other Clinician: Referring Provider: Treating Provider/Extender: [name] in Treatment: 3 Information Obtained From Patient Chart Constitutional Symptoms (General Health) Medical History: Past Medical History Notes: morbid obesity Ear/Nose/Mouth/Throat Medical History: Negative for: Chronic sinus problems/congestion; Middle ear problems Past Medical History Notes: chronic cough Respiratory Medical History: Positive for: Sleep Apnea - no CPAP Past Medical History Notes: hx PNA Cardiovascular Medical History: Positive for: Arrhythmia - afib; Congestive Heart Failure; Hypertension; Peripheral Venous  Disease Gastrointestinal Medical History: Past Medical History Notes: chronic "stomach pain" Endocrine Medical History: Positive for: Type II Diabetes Negative for: Type I Diabetes Time with diabetes: 20 yrs Treated with: Insulin, Oral agents Blood sugar tested every day: Yes Tested : 2 times per day Genitourinary Medical History: Negative for: End Stage Renal Disease Integumentary (Skin) Medical History: Negative for: History of Burn Musculoskeletal Medical History: Positive for: Gout; Rheumatoid Arthritis Negative for: Osteoarthritis; Osteomyelitis Neurologic Medical History: Positive for: Neuropathy Past Medical History Notes: carpal tunnel right Oncologic Medical History: Negative for: Received Chemotherapy; Received Radiation Psychiatric Medical History: Positive for: Confinement Anxiety Negative for: Anorexia/bulimia Immunizations Pneumococcal Vaccine: Received Pneumococcal Vaccination: No Implantable Devices No devices added Hospitalization / Surgery History Type of Hospitalization/Surgery c-section x2 right carpal tunnel release exploratory lap, right oophrectomy salpingoophrectomy tubal ligation Family and Social History Cancer: No; Diabetes: Yes - Siblings; Heart Disease: Yes - Siblings,Maternal Grandparents; Hereditary Spherocytosis: No; Hypertension: Yes - Father,Mother; Kidney Disease: Yes - Siblings; Lung Disease: No; Seizures: No; Stroke: No; Thyroid Problems: No; Tuberculosis: No; Former smoker - quit 2015; Marital Status - Married;  Alcohol Use: Never; Drug Use: Prior History - hx TCH; Caffeine Use: Daily - soda; Financial Concerns: No; Food, Clothing or Shelter Needs: No; Support System Lacking: No; Transportation Concerns: No Electronic Signature(s) Signed: 11/17/2021 3:37:01 PM By: Kalman Shan DO Signed: 11/17/2021 5:00:37 PM By: Levan Hurst RN, BSN Entered By: Kalman Shan on 11/17/2021  15:33:44 -------------------------------------------------------------------------------- [place] Details Patient Name: Date of Service: Kathleen Kathleen Kane 11/17/2021 Medical Record Number: [id number] Patient Account Number: [id number] Date of Birth/Sex: Treating RN: 01/02/1961 (61 y.o. 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 S31.104A Unspecified open wound of abdominal wall, left lower quadrant without penetration into peritoneal cavity, initial encounter Facility Procedures CPT4 Code: [id number] Description: 99213 - WOUND CARE VISIT-LEV 3 EST PT Modifier: Quantity: 1 Physician Procedures : CPT4 Code Description Modifier [id number] - WC PHYS LEVEL 3 - EST PT ICD-10 Diagnosis Description 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

## 2021-12-11 ENCOUNTER — Ambulatory Visit
Admission: RE | Admit: 2021-12-11 | Discharge: 2021-12-11 | Disposition: A | Discharge status: Home / Self Care | Payer: Medicaid Other | Source: Ambulatory Visit | Attending: Internal Medicine | Admitting: Internal Medicine

## 2021-12-11 ENCOUNTER — Other Ambulatory Visit: Payer: Self-pay

## 2021-12-11 DIAGNOSIS — Z872 Personal history of diseases of the skin and subcutaneous tissue: Secondary | ICD-10-CM

## 2022-01-04 ENCOUNTER — Other Ambulatory Visit: Payer: Self-pay | Admitting: Cardiovascular Disease

## 2022-01-06 NOTE — Progress Notes (Signed)
Office Visit Note  Patient: Kathleen Kane             Date of Birth: 03-Mar-1961           MRN: [id number]             PCP: Fleet Contras, MD Referring: Fleet Contras, MD Visit Date: 01/20/2022 Occupation: @GUAROCC @  Subjective:  Pain in multiple joints  History of Present Illness: BRINYA HEBERLEIN is a 61 y.o. female with history of seropositive rheumatoid arthritis and osteoarthritis.  Patient is currently not taking any immunosuppressive agents.  At her last office visit on 10/21/2021 she was advised to hold Orencia and Rasuvo while being treated for pneumonia and an open wound in the inguinal region.  According to the patient she has had recurrent bouts of pneumonia and is apprehensive to restart on Orencia or Rasuvo at this time.  She states that urgent care provided a prescription for prednisone so she followed her own taper starting at 40 mg and then reducing to 20 mg then down to 15 mg.  Her last dose of prednisone was on Monday.  She states that while on prednisone she was able to increase her activity level.  She states that the pain and swelling in her hands knees and ankles improved significantly.  She would like to remain on prednisone long-term.  She states that she has not had any recurrence of an infection while taking prednisone.  Activities of Daily Living:  Patient reports morning stiffness for all day. Patient Denies nocturnal pain.  Difficulty dressing/grooming: Reports Difficulty climbing stairs: Reports Difficulty getting out of chair: Reports Difficulty using hands for taps, buttons, cutlery, and/or writing: Reports  Review of Systems  Constitutional:  Positive for fatigue.  HENT:  Negative for mouth sores, mouth dryness and nose dryness.   Eyes:  Negative for pain, itching and dryness.  Respiratory:  Negative for shortness of breath and difficulty breathing.   Cardiovascular:  Negative for chest pain and palpitations.  Gastrointestinal:  Negative for blood in  stool, constipation and diarrhea.  Endocrine: Negative for increased urination.  Genitourinary:  Negative for difficulty urinating.  Musculoskeletal:  Positive for joint pain, joint pain, joint swelling, myalgias, morning stiffness, muscle tenderness and myalgias.  Skin:  Negative for color change, rash and redness.  Allergic/Immunologic: Positive for susceptible to infections.  Neurological:  Positive for headaches and weakness. Negative for dizziness, numbness and memory loss.  Hematological:  Negative for bruising/bleeding tendency.  Psychiatric/Behavioral:  Negative for confusion.    PMFS History:  Patient Active Problem List   Diagnosis Date Noted   Aortic atherosclerosis (HCC) 05/13/2021   Acute otalgia, right 11/26/2020   Arthralgia of right temporomandibular joint 11/26/2020   Bilateral impacted cerumen 11/26/2020   HCAP (healthcare-associated pneumonia) 10/02/2018   CHF exacerbation (HCC) 10/01/2018   Community acquired pneumonia 10/01/2018   OSA (obstructive sleep apnea) 02/10/2017   Carpal tunnel syndrome, right 12/15/2016   Extensor tenosynovitis of wrist, right 12/15/2016   Bilateral primary osteoarthritis of knee 10/08/2015   Rheumatoid arthritis, 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   Acute on chronic diastolic CHF (congestive heart failure) (HCC) 08/08/2015   Morbid obesity (HCC) 05/06/2015   Essential hypertension 04/10/2015   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   Primary osteoarthritis involving multiple joints 04/09/2015   Elevated uric acid in blood 01/23/2014   High risk medication use 01/23/2014   Numbness and tingling in left hand 10/01/2013   Tobacco abuse 10/01/2013  Elevated CA-125 02/06/2013   Pelvic mass in female 01/29/2013   Diabetes mellitus (HCC) 08/10/2012   Atrial fibrillation (HCC)    Chronic diastolic CHF (congestive heart failure) (HCC)     Past Medical History:  Diagnosis Date   Acute on chronic diastolic CHF  (congestive heart failure) (HCC) 08/08/2015   Anxiety    Atrial fibrillation, persistent (HCC)    Carpal tunnel syndrome, right 12/15/2016   CHF (congestive heart failure) (HCC) 2013   No echo found from that time, most likely diastolic   CHF exacerbation (HCC) 10/01/2018   Depression    Diabetes mellitus (HCC) 08/10/2012   Diabetes mellitus without complication (HCC)    Hypertension    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 AHI-6.8/hr, during REM-27.3/hr. RDI-25.0/hr, during REM-40.9/hr. 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    Family History  Problem Relation Age of Onset   Hypertension Mother    Diabetes Mother    Throat cancer Mother    Hypertension Father    Diabetes Paternal Grandmother    Diabetes Sister    Heart Problems Sister    Kidney failure Sister    Hypertension Brother    Diabetes Brother    Healthy Son    Autoimmune disease Daughter    Past Surgical History:  Procedure Laterality Date   CARDIOVASCULAR STRESS TEST  02/16/2013   no significant EKG changes with Lexiscan, normal LV function and normal wall function   CESAREAN SECTION     x2   DOPPLER ECHOCARDIOGRAPHY  01/24/2008   EF 55-60%, no diagnostic evidence of LV wall motion abnormalities, LV wall thickness was mild-moderately increased.   HAND SURGERY Right 2018   LAPAROTOMY Right 02/27/2013   Procedure: EXPLORATORY LAPAROTOMY, right salpingo-oopherectomy;  Surgeon: Laurette Schimke, MD;  Location: [facility];  Service: Gynecology;  Laterality: Right;   left breast cyst     SALPINGOOPHORECTOMY Right 02/27/2013   Procedure: SALPINGO OOPHORECTOMY;  Surgeon: Laurette Schimke, MD;   Location: [facility];  Service: Gynecology;  Laterality: Right;   TUBAL LIGATION Bilateral 1989   Social History   Social History Narrative   Not on file    There is no immunization history on file for this patient.   Objective: Vital Signs: BP 127/82 (BP Location: Right Arm, Patient Position: Sitting, Cuff Size: Normal)   Pulse 74   Ht 5\' 2"  (1.575 m)   Wt 230 lb (104.3 kg) Comment: per patient, patient in wheelchair  LMP 10/28/2013   BMI 42.07 kg/m    Physical Exam Vitals and nursing note reviewed.  Constitutional:      Appearance: She is well-developed.  HENT:     Head: Normocephalic and atraumatic.  Eyes:     Conjunctiva/sclera: Conjunctivae normal.  Pulmonary:     Effort: Pulmonary effort is normal.  Abdominal:     Palpations: Abdomen is soft.  Musculoskeletal:     Cervical back: Normal range of motion.  Skin:    General: Skin is warm and dry.     Capillary Refill: Capillary refill takes less than 2 seconds.  Neurological:     Mental Status: She is alert and oriented  to person, place, and time.  Psychiatric:        Behavior: Behavior normal.     Musculoskeletal Exam: Patient remained in the wheelchair during the examination today.  C-spine has limited range of motion.  Painful range of motion of both shoulder joints with abduction to about 110 degrees bilaterally.  Elbow joints have good range of motion.  Rheumatoid nodules palpable on the extensor surface of the right elbow.  Limited range of motion of both wrist joints with tenderness and warmth.  Tenderness and synovitis of several MCP and PIP joints as described below.  Hip joints difficult to assess in seated position painful range of motion of both knee joints and ankle joints.  No tenderness over MTP joints.  CDAI Exam: CDAI Score: 19.4  Patient Global: 7 mm; Provider Global: 7 mm Swollen: 8 ; Tender: 12  Joint Exam 01/20/2022      Right  Left  Wrist   Tender   Tender  MCP 2  Swollen Tender  Swollen Tender   MCP 3  Swollen Tender     PIP 2  Swollen Tender  Swollen Tender  PIP 3  Swollen Tender  Swollen Tender  PIP 4  Swollen Tender     Ankle   Tender   Tender     Investigation: No additional findings.  Imaging: DG Chest 2 View  Result Date: 01/11/2022 CLINICAL DATA:  Cough x3 weeks. EXAM: CHEST - 2 VIEW COMPARISON:  October 21, 2021 FINDINGS: Stable, mild to moderate severity diffusely increased interstitial lung markings are seen. Stable, mild to moderate severity patchy airspace disease is again noted within the right upper lobe on the lateral view. There is no evidence of a pleural effusion or pneumothorax. The cardiac silhouette is mildly enlarged and unchanged in size. Degenerative changes seen throughout the bilateral shoulders and thoracic spine. IMPRESSION: Cardiomegaly with stable diffuse interstitial prominence and patchy right upper lobe airspace disease. While this is likely chronic in etiology, a superimposed infectious component cannot completely be excluded. Correlation with chest CT is recommended. Electronically Signed   By: Aram Candela M.D.   On: 01/11/2022 20:17    Recent Labs: Lab Results  Component Value Date   WBC 8.1 01/14/2022   HGB 11.7 01/14/2022   PLT 324 01/14/2022   NA 140 01/14/2022   K 3.9 01/14/2022   CL 108 01/14/2022   CO2 19 (L) 01/14/2022   GLUCOSE 105 (H) 01/14/2022   BUN 33 (H) 01/14/2022   CREATININE 0.79 01/14/2022   BILITOT 0.3 01/14/2022   ALKPHOS 105 07/21/2020   AST 10 01/14/2022   ALT 10 01/14/2022   PROT 7.0 01/14/2022   ALBUMIN 4.0 07/21/2020   CALCIUM 9.5 01/14/2022   GFRAA 51 (L) 04/02/2021   QFTBGOLDPLUS NEGATIVE 01/12/2021    Speciality Comments: dxd 2015 at [facility].  An inadequate response to Plaquenil, Humira, Remicade.  On methotrexate and prednisone since 2015.  Orencia added May 2021.  Prednisone 5 mg p.o. daily  Procedures:  No procedures performed Allergies: Ace inhibitors and Lisinopril   Assessment / Plan:      Visit Diagnoses: Rheumatoid arthritis with rheumatoid factor of multiple sites without organ or systems involvement [employer]): She presents today with persistent joint tenderness and synovitis involving multiple joints.  She continues to have chronic pain and stiffness in both shoulders, both wrists, both hands, both knees, both ankle joints.  She has not currently taking any immunosuppressive agents.  She discontinued Orencia and Rasuvo in December 2022  after recurrent bouts of pneumonia as well as a wound infection in the left inguinal region.  She has not restarted on Orencia or Rasuvo yet and does not plan to due to the concern for immunosuppression.  She previously had an inadequate response to Plaquenil, Humira, and Remicade.  She is not a good candidate for TNF inhibitors due to history of CHF.  The patient was given a prednisone taper at urgent care and has been following her own taper starting at 40 mg and tapering to 20 mg then to 15 mg.  Her last dose of prednisone was 15 mg on Monday but she ran out of the prescription.  She would like to remain on long-term prednisone.  I discussed the risks of long-term prednisone use at length.  The patient reports that she has recurrent flares if the prednisone dose is less than 15 mg.  I discussed the patient's case with Dr. Corliss Skains today in the office.  She discouraged the use of high-dose long-term prednisone use.  I discussed with the patient that ideally her prednisone dose should remain less than 7.5 mg daily to minimize long-term risks.  She is concerned that this dose of prednisone will not manage her symptoms.  Different treatment options were discussed today in detail.  She does not want to take immunosuppressive agent at this time.  Referral to transfer care for a second opinion to a rheumatologist in [place] will be placed today for further evaluation and management.  The patient was in agreement.  A prednisone taper will be sent to the pharmacy today  to manage her current symptoms.  High risk medication use -Recurrent infections and inadequate response to Orencia and Rasuvo combination.   Inadequate response to Plaquenil, Humira, and Remicade.  She does not want to restart on immunosuppressive therapy at this time. Discussed the risks of long-term prednisone use.  Discussed that we do not feel comfortable prescribing high-dose prednisone over long periods of time.  Rheumatoid nodulosis (HCC) - Canceled right elbow nodule excision on 06/23/21 with Dr. August Saucer.  She has nodulosis over her elbows and over her bilateral hands is unchanged.  Current chronic use of systemic steroids - Since 2015.  Discussed the risks of long-term prednisone use at length today.  All questions were addressed.  Primary osteoarthritis of both hands - Severe RA and OA overlap.  She has severe pain and intermittent inflammation in both hands.  According to the patient the only thing that helps with her hand pain and swelling is prednisone.  She did not notice any improvement while on Orencia and Rasuvo.  Primary osteoarthritis of both knees - Severe, end-stage osteoarthritis.  She has chronic pain in both knee joints.  She remained in the wheelchair during the examination today.  She has painful range of motion of both knee joints on examination.  Primary osteoarthritis of both feet - Severe RA and OA overlap.  She has ongoing pain and stiffness in both ankle joints.  She has tenderness of both ankle joints on examination today.  Idiopathic chronic gout of multiple sites with tophus -She has not had any signs or symptoms of a gout flare.  She is clinically doing well taking allopurinol 100 g daily.  Other medical conditions are listed as follows:  Subacute cough: Resolved.  Rash -left inguinal region: Healed  Chronic diastolic CHF (congestive heart failure) (HCC) - She is not a good candidate for TNF inhibitors.   Chronic atrial fibrillation (HCC) - She remains on  Eliquis.  OSA (obstructive sleep apnea)  Essential hypertension  Vitamin D deficiency  Type 2 diabetes mellitus with hyperglycemia, with long-term current use of insulin (HCC)  History of pneumonia  Former smoker  Orders: Orders Placed This Encounter  Procedures   Ambulatory referral to Rheumatology   Meds ordered this encounter  Medications   predniSONE (DELTASONE) 5 MG tablet    Sig: Take 4 tablets by mouth daily x1 week, 3 tablets daily x1 week, 2 tablets daily x1 week, 1 tablet daily x1 week.    Dispense:  70 tablet    Refill:  0      Follow-Up Instructions: Return if symptoms worsen or fail to improve, for Rheumatoid arthritis, Osteoarthritis.   Gearldine Bienenstock, PA-C  Note - This record has been created using Dragon software.  Chart creation errors have been sought, but may not always  have been located. Such creation errors do not reflect on  the standard of medical care.

## 2022-01-10 ENCOUNTER — Other Ambulatory Visit: Payer: Self-pay | Admitting: Cardiovascular Disease

## 2022-01-11 ENCOUNTER — Ambulatory Visit (HOSPITAL_COMMUNITY)
Admission: EM | Admit: 2022-01-11 | Discharge: 2022-01-11 | Disposition: A | Discharge status: Home / Self Care | Payer: Medicaid Other | Attending: Family Medicine | Admitting: Family Medicine

## 2022-01-11 ENCOUNTER — Encounter (HOSPITAL_COMMUNITY): Payer: Self-pay | Admitting: *Deleted

## 2022-01-11 ENCOUNTER — Other Ambulatory Visit: Payer: Self-pay

## 2022-01-11 ENCOUNTER — Ambulatory Visit (INDEPENDENT_AMBULATORY_CARE_PROVIDER_SITE_OTHER): Payer: Medicaid Other

## 2022-01-11 DIAGNOSIS — J069 Acute upper respiratory infection, unspecified: Secondary | ICD-10-CM | POA: Diagnosis not present

## 2022-01-11 DIAGNOSIS — M069 Rheumatoid arthritis, unspecified: Secondary | ICD-10-CM

## 2022-01-11 DIAGNOSIS — R059 Cough, unspecified: Secondary | ICD-10-CM

## 2022-01-11 MED ORDER — BENZONATATE 100 MG PO CAPS: 100.0000 mg | ORAL_CAPSULE | Freq: Three times a day (TID) | ORAL | 0 refills | Status: DC | PRN

## 2022-01-11 MED ORDER — METHYLPREDNISOLONE 4 MG PO TBPK: ORAL_TABLET | ORAL | 0 refills | Status: DC

## 2022-01-11 NOTE — Telephone Encounter (Signed)
Prescription refill request for Eliquis received. ?Indication: afib  ?Last office visit: nishan 12/09/2020 ?Scr: 1.01, 10/21/2021 ?Age: 61 yo  ?Weight: 108.9 kg  ? ?Pt is overdue to see cardiologist. MSG sent to schedulers.  ?

## 2022-01-11 NOTE — Telephone Encounter (Signed)
Pt is scheduled to see Dr Johnsie Cancel on 6/1. Will send in refill  ?

## 2022-01-11 NOTE — Discharge Instructions (Addendum)
Your chest x-ray did not show any new changes or pneumonia.  You do have some increased markings in your right upper lung, that is been there for some time.  Please see your primary doctor about this to see if need to do any further evaluation ? ?Benzonatate 100 mg 1 every 8 hours as needed for cough ? ?You can also take Mucinex DM or Robitussin-DM as needed for cough ? ?Medrol Dosepak has been sent in  and you will taper that dose over 6 days.  Do not take your other prednisone when you are taking the Dosepak ?

## 2022-01-11 NOTE — ED Triage Notes (Signed)
Pt reports a cough for 3 weeks. Pt also reports joint pain. ?

## 2022-01-11 NOTE — ED Provider Notes (Signed)
[place]    CSN: [id number] Arrival date & time: 01/11/22  [place]      History   Chief Complaint Chief Complaint  Patient presents with   Cough   Joint Pain    Generalized      HPI Kathleen Kane is a 61 y.o. female.    Cough Here for 3 weeks of cough and rhinorrhea.  Her husband came in about the time she started coughing and was tested positive for COVID.  Patient did not get tested.  She continues to cough and is not short of breath.  She requests  Tray  Also she has rheumatoid arthritis.  She states that she would like to have a prednisone taper for her arthritis flare.  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 AHI-6.8/hr, during REM-27.3/hr. RDI-25.0/hr, during REM-40.9/hr. avg o2 sat during REM and NREM 95%   OSA (obstructive sleep apnea) 02/10/2017   Pelvic mass in female 01/29/2013   With ascites    Pneumonia 10/02/2018   Rheumatoid arthritis ([place])    Rheumatoid arthritis, 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[place]) 10/08/2015    Patient Active Problem List   Diagnosis Date Noted   Aortic atherosclerosis ([place]) 05/13/2021   Acute otalgia, right 11/26/2020   Arthralgia of right temporomandibular joint 11/26/2020   Bilateral impacted cerumen 11/26/2020   HCAP (healthcare-associated pneumonia) 10/02/2018   CHF exacerbation  ([place]) 10/01/2018   Community acquired pneumonia 10/01/2018   OSA (obstructive sleep apnea) 02/10/2017   Carpal tunnel syndrome, right 12/15/2016   Extensor tenosynovitis of wrist, right 12/15/2016   Bilateral primary osteoarthritis of knee 10/08/2015   Rheumatoid arthritis, 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[place]) 10/08/2015   Acute on chronic diastolic CHF (congestive heart failure) ([place]) 08/08/2015   Morbid obesity ([place]) 05/06/2015   Essential hypertension 04/10/2015   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   Primary osteoarthritis involving multiple joints 04/09/2015   Elevated uric acid in blood 01/23/2014   High risk medication use 01/23/2014   Numbness and tingling in left hand 10/01/2013   Tobacco abuse 10/01/2013   Elevated CA-125 02/06/2013   Pelvic mass in female 01/29/2013   Diabetes mellitus ([place]) 08/10/2012   Atrial fibrillation (HCC)    Chronic diastolic CHF (congestive heart failure) ([place])     Past Surgical History:  Procedure Laterality Date   CARDIOVASCULAR STRESS TEST  02/16/2013   no significant EKG changes with Lexiscan, normal LV function and normal wall function   CESAREAN SECTION     x2   DOPPLER ECHOCARDIOGRAPHY  01/24/2008   EF 55-60%, no diagnostic evidence of LV wall motion abnormalities, LV wall thickness was mild-moderately increased.   HAND SURGERY Right 2018   LAPAROTOMY Right 02/27/2013   Procedure: EXPLORATORY LAPAROTOMY, right salpingo-oopherectomy;  Surgeon: [name], MD;  Location: [facility];  Service: Gynecology;  Laterality: Right;   left breast cyst     SALPINGOOPHORECTOMY Right 02/27/2013   Procedure: SALPINGO OOPHORECTOMY;  Surgeon: [name], MD;  Location: [facility];  Service: Gynecology;  Laterality: Right;   TUBAL LIGATION Bilateral 1989    OB History   No obstetric history on file.      Home Medications    Prior to Admission medications   Medication Sig Start Date End Date Taking? Authorizing Provider   benzonatate (TESSALON) 100 MG capsule Take 1 capsule (100 mg total) by mouth 3 (three) times daily as needed for cough. 01/11/22  Yes Barrett Henle, MD  methylPREDNISolone (MEDROL DOSEPAK) 4 MG TBPK tablet Take as per package instructions 01/11/22  Yes Dontrail Blackwell, Gwenlyn Perking, MD  predniSONE (DELTASONE) 5 MG tablet TAKE 1 TABLET (5 MG TOTAL) BY MOUTH DAILY. 11/05/21   Ofilia Neas, PA-C  Accu-Chek FastClix Lancets MISC Apply topically. 08/04/20   [provider]  ACCU-CHEK GUIDE test strip  08/14/20   [provider]  allopurinol (ZYLOPRIM) 100 MG tablet Take 100 mg by mouth daily. 03/10/21   [provider]  apixaban (ELIQUIS) 5 MG TABS tablet TAKE 1 TABLET BY MOUTH TWICE A DAY 01/11/22   Josue Hector, MD  atorvastatin (LIPITOR) 20 MG tablet Take 1 tablet (20 mg total) by mouth daily. Please schedule yearly appointment for future refills. 1st attempt. Thank you 01/05/22   Josue Hector, MD  B-D ULTRAFINE III SHORT PEN 31G X 8 MM MISC Inject into the skin 2 (two) times daily. 06/30/20   [provider]  blood glucose meter kit and supplies Dispense based on patient and insurance preference. Use up to four times daily as directed. (FOR ICD-10 E10.9, E11.9). 10/03/18   Aline August, MD  cephALEXin (KEFLEX) 500 MG capsule Take 1 capsule (500 mg total) by mouth 3 (three) times daily. 10/21/21   Jaynee Eagles, PA-C  clobetasol cream (TEMOVATE) 3.70 % Apply 1 application topically 2 (two) times daily. 07/22/21   Ofilia Neas, PA-C  diclofenac sodium (VOLTAREN) 1 % GEL Apply 2 g topically 4 (four) times daily. 04/14/18   Kirichenko, Tatyana, PA-C  fluconazole (DIFLUCAN) 150 MG tablet Take 1 tablet (150 mg total) by mouth once a week. 10/21/21   Jaynee Eagles, PA-C  folic acid (FOLVITE) 1 MG tablet Take 1 mg by mouth daily. 08/19/17   [provider]  furosemide (LASIX) 40 MG tablet TAKE 1 TABLET BY MOUTH TWICE A DAY Patient taking differently: Take 40 mg by mouth  daily. 10/29/19   Josue Hector, MD  gabapentin (NEURONTIN) 300 MG capsule Take 300 mg by mouth 3 (three) times daily.    [provider]  hydrOXYzine (ATARAX) 25 MG tablet Take 0.5-1 tablets (12.5-25 mg total) by mouth every 8 (eight) hours as needed for itching. 10/21/21   Jaynee Eagles, PA-C  ibuprofen (ADVIL) 800 MG tablet Take 800 mg by mouth every evening.    [provider]  losartan (COZAAR) 25 MG tablet TAKE 1 TABLET BY MOUTH EVERY DAY 10/29/19   Josue Hector, MD  metFORMIN (GLUCOPHAGE) 500 MG tablet Take 2 tablets (1,000 mg total) by mouth 2 (two) times daily. 10/03/18   Aline August, MD  Methotrexate, PF, (RASUVO) 17.5 MG/0.35ML SOAJ Inject  1 pen (17.5 mg) into the skin once a week. 07/24/21   Ofilia Neas, PA-C  metoprolol tartrate (LOPRESSOR) 25 MG tablet Take 0.5 tablets (12.5 mg total) by mouth 2 (two) times daily.  02/07/19   Josue Hector, MD  NOVOLOG MIX 70/30 FLEXPEN (70-30) 100 UNIT/ML FlexPen Inject 0.4 mLs (40 Units total) into the skin 2 (two) times daily with a meal. 10/03/18   Starla Link, Kshitiz, MD  ORENCIA CLICKJECT 233 MG/ML SOAJ INJECT THE CONTENTS OF 1 PEN INTO THE SKIN EVERY 7 DAYS. 09/16/21   Ofilia Neas, PA-C  polyethylene glycol-electrolytes (NULYTELY) 420 g solution See admin instructions. Patient not taking: Reported on 07/22/2021 07/07/21   [provider]  tiZANidine (ZANAFLEX) 4 MG tablet Take 4 mg by mouth 2 (two) times daily as needed for muscle spasms. 03/09/21   [provider]  triamcinolone cream (KENALOG) 0.5 % Apply topically 2 (two) times daily as needed. 06/24/21   [provider]    Family History Family History  Problem Relation Age of Onset   Hypertension Mother    Diabetes Mother    Throat cancer Mother    Hypertension Father    Diabetes Paternal Grandmother    Diabetes Sister    Heart Problems Sister    Kidney failure Sister    Hypertension Brother    Diabetes Brother    Healthy Son     Autoimmune disease Daughter     Social History Social History   Tobacco Use   Smoking status: Former    Packs/day: 0.75    Years: 24.00    Pack years: 18.00    Types: Cigarettes    Quit date: 06/08/2014    Years since quitting: 7.6   Smokeless tobacco: Never   Tobacco comments:    smoking cessation info given.   Vaping Use   Vaping Use: Former  Substance Use Topics   Alcohol use: No    Alcohol/week: 0.0 standard drinks   Drug use: No     Allergies   Ace inhibitors and Lisinopril   Review of Systems Review of Systems  Respiratory:  Positive for cough.     Physical Exam Triage Vital Signs ED Triage Vitals  Enc Vitals Group     BP 01/11/22 1927 (!) 122/58     Pulse Rate 01/11/22 1927 88     Resp 01/11/22 1927 20     Temp 01/11/22 1927 98.6 F (37 C)     Temp src --      SpO2 01/11/22 1927 98 %     Weight --      Height --      Head Circumference --      Peak Flow --      Pain Score 01/11/22 1924 8     Pain Loc --      Pain Edu? --      Excl. in [place]? --    No data found.  Updated Vital Signs BP (!) 122/58    Pulse 88    Temp 98.6 F (37 C)    Resp 20    LMP 10/28/2013    SpO2 98%   Visual Acuity Right Eye Distance:   Left Eye Distance:   Bilateral Distance:    Right Eye Near:   Left Eye Near:    Bilateral Near:     Physical Exam Vitals reviewed.  Constitutional:      General: She is not in acute distress.    Appearance: She is not toxic-appearing.  HENT:     Nose: Nose normal.     Mouth/Throat:     Mouth: Mucous membranes are moist.  Eyes:     Extraocular Movements: Extraocular movements  intact.  Cardiovascular:     Rate and Rhythm: Normal rate and regular rhythm.     Heart sounds: No murmur heard. Pulmonary:     Effort: Pulmonary effort is normal. No respiratory distress.     Breath sounds: Normal breath sounds. No stridor. No wheezing, rhonchi or rales.  Musculoskeletal:     Cervical back: Neck supple.  Lymphadenopathy:      Cervical: No cervical adenopathy.  Skin:    Coloration: Skin is not jaundiced or pale.  Neurological:     Mental Status: She is oriented to person, place, and time.  Psychiatric:        Behavior: Behavior normal.     UC Treatments / Results  Labs (all labs ordered are listed, but only abnormal results are displayed) Labs Reviewed - No data to display  EKG   Radiology DG Chest 2 View  Result Date: 01/11/2022 CLINICAL DATA:  Cough x3 weeks. EXAM: CHEST - 2 VIEW COMPARISON:  October 21, 2021 FINDINGS: Stable, mild to moderate severity diffusely increased interstitial lung markings are seen. Stable, mild to moderate severity patchy airspace disease is again noted within the right upper lobe on the lateral view. There is no evidence of a pleural effusion or pneumothorax. The cardiac silhouette is mildly enlarged and unchanged in size. Degenerative changes seen throughout the bilateral shoulders and thoracic spine. IMPRESSION: Cardiomegaly with stable diffuse interstitial prominence and patchy right upper lobe airspace disease. While this is likely chronic in etiology, a superimposed infectious component cannot completely be excluded. Correlation with chest CT is recommended. Electronically Signed   By: Virgina Norfolk M.D.   On: 01/11/2022 20:17    Procedures Procedures (including critical care time)  Medications Ordered in UC Medications - No data to display  Initial Impression / Assessment and Plan / UC Course  I have reviewed the triage vital signs and the nursing notes.  Pertinent labs & imaging results that were available during my care of the patient were reviewed by me and considered in my medical decision making (see chart for details).     Chest x-ray shows a stable abnormality of some interstitial lung markings being increased in her right upper lobe.  This was present on the December 2022 chest x-ray also.  I will treat the cough.  Also she requests steroid taper for her  RA flare.  Her last A1c I see in epic was in August and was 9.2.  I acquiesced to do a Medrol Dosepak since it is brief and fairly low-dose.  Discussed with her that she needs to drink plenty of fluids and be really good on her diet while she is on this medicine as the steroids can increase her sugars.  She asked me if I could do any longer than that and I told her she needed to talk to her rheumatologist about that Final Clinical Impressions(s) / UC Diagnoses   Final diagnoses:  Viral URI with cough  Rheumatoid arthritis, involving unspecified site, unspecified whether rheumatoid factor present [facility])     Discharge Instructions      Your chest x-ray did not show any new changes or pneumonia.  You do have some increased markings in your right upper lung, that is been there for some time.  Please see your primary doctor about this to see if need to do any further evaluation  Benzonatate 100 mg 1 every 8 hours as needed for cough  You can also take Mucinex DM or Robitussin-DM as needed for  cough  Medrol Dosepak has been sent in  and you will taper that dose over 6 days.  Do not take your other prednisone when you are taking the [facility]     ED Prescriptions     Medication Sig Dispense Auth. Provider   benzonatate (TESSALON) 100 MG capsule Take 1 capsule (100 mg total) by mouth 3 (three) times daily as needed for cough. 21 capsule Jian Hodgman, Gwenlyn Perking, MD   methylPREDNISolone (MEDROL DOSEPAK) 4 MG TBPK tablet Take as per package instructions 1 each Windy Carina Gwenlyn Perking, MD      PDMP not reviewed this encounter.   Barrett Henle, MD 01/11/22 2032

## 2022-01-14 ENCOUNTER — Other Ambulatory Visit: Payer: Self-pay | Admitting: *Deleted

## 2022-01-14 DIAGNOSIS — Z79899 Other long term (current) drug therapy: Secondary | ICD-10-CM

## 2022-01-15 LAB — CBC WITH DIFFERENTIAL/PLATELET
Absolute Monocytes: 373 cells/uL (ref 200–950)
Basophils Absolute: 32 cells/uL (ref 0–200)
Basophils Relative: 0.4 %
Eosinophils Absolute: 122 cells/uL (ref 15–500)
Eosinophils Relative: 1.5 %
HCT: 36.9 % (ref 35.0–45.0)
Hemoglobin: 11.7 g/dL (ref 11.7–15.5)
Lymphs Abs: 859 cells/uL (ref 850–3900)
MCH: 25.7 pg — ABNORMAL LOW (ref 27.0–33.0)
MCHC: 31.7 g/dL — ABNORMAL LOW (ref 32.0–36.0)
MCV: 80.9 fL (ref 80.0–100.0)
MPV: 10.2 fL (ref 7.5–12.5)
Monocytes Relative: 4.6 %
Neutro Abs: 6715 cells/uL (ref 1500–7800)
Neutrophils Relative %: 82.9 %
Platelets: 324 10*3/uL (ref 140–400)
RBC: 4.56 10*6/uL (ref 3.80–5.10)
RDW: 16.3 % — ABNORMAL HIGH (ref 11.0–15.0)
Total Lymphocyte: 10.6 %
WBC: 8.1 10*3/uL (ref 3.8–10.8)

## 2022-01-15 LAB — COMPLETE METABOLIC PANEL WITH GFR
AG Ratio: 1.4 (calc) (ref 1.0–2.5)
ALT: 10 U/L (ref 6–29)
AST: 10 U/L (ref 10–35)
Albumin: 4.1 g/dL (ref 3.6–5.1)
Alkaline phosphatase (APISO): 59 U/L (ref 37–153)
BUN/Creatinine Ratio: 42 (calc) — ABNORMAL HIGH (ref 6–22)
BUN: 33 mg/dL — ABNORMAL HIGH (ref 7–25)
CO2: 19 mmol/L — ABNORMAL LOW (ref 20–32)
Calcium: 9.5 mg/dL (ref 8.6–10.4)
Chloride: 108 mmol/L (ref 98–110)
Creat: 0.79 mg/dL (ref 0.50–1.05)
Globulin: 2.9 g/dL (calc) (ref 1.9–3.7)
Glucose, Bld: 105 mg/dL — ABNORMAL HIGH (ref 65–99)
Potassium: 3.9 mmol/L (ref 3.5–5.3)
Sodium: 140 mmol/L (ref 135–146)
Total Bilirubin: 0.3 mg/dL (ref 0.2–1.2)
Total Protein: 7 g/dL (ref 6.1–8.1)
eGFR: 85 mL/min/{1.73_m2} (ref >=60)

## 2022-01-15 NOTE — Progress Notes (Signed)
BUN is elevated. Creatinine is WNL and GFR is WNL.  Rest of CMP WNL.  CBC stable.  We will continue to monitor lab work closely.

## 2022-01-20 ENCOUNTER — Encounter: Payer: Self-pay | Admitting: Physician Assistant

## 2022-01-20 ENCOUNTER — Other Ambulatory Visit: Payer: Self-pay

## 2022-01-20 ENCOUNTER — Ambulatory Visit (INDEPENDENT_AMBULATORY_CARE_PROVIDER_SITE_OTHER): Payer: Medicaid Other | Admitting: Physician Assistant

## 2022-01-20 VITALS — BP 127/82 | HR 74 | Ht 62.0 in | Wt 230.0 lb

## 2022-01-20 DIAGNOSIS — M0579 Rheumatoid arthritis with rheumatoid factor of multiple sites without organ or systems involvement: Secondary | ICD-10-CM | POA: Diagnosis not present

## 2022-01-20 DIAGNOSIS — E559 Vitamin D deficiency, unspecified: Secondary | ICD-10-CM

## 2022-01-20 DIAGNOSIS — M19041 Primary osteoarthritis, right hand: Secondary | ICD-10-CM

## 2022-01-20 DIAGNOSIS — Z7952 Long term (current) use of systemic steroids: Secondary | ICD-10-CM

## 2022-01-20 DIAGNOSIS — I10 Essential (primary) hypertension: Secondary | ICD-10-CM

## 2022-01-20 DIAGNOSIS — R052 Subacute cough: Secondary | ICD-10-CM

## 2022-01-20 DIAGNOSIS — I482 Chronic atrial fibrillation, unspecified: Secondary | ICD-10-CM

## 2022-01-20 DIAGNOSIS — M0630 Rheumatoid nodule, unspecified site: Secondary | ICD-10-CM | POA: Diagnosis not present

## 2022-01-20 DIAGNOSIS — I5032 Chronic diastolic (congestive) heart failure: Secondary | ICD-10-CM

## 2022-01-20 DIAGNOSIS — Z87891 Personal history of nicotine dependence: Secondary | ICD-10-CM

## 2022-01-20 DIAGNOSIS — Z79899 Other long term (current) drug therapy: Secondary | ICD-10-CM

## 2022-01-20 DIAGNOSIS — M19071 Primary osteoarthritis, right ankle and foot: Secondary | ICD-10-CM

## 2022-01-20 DIAGNOSIS — Z794 Long term (current) use of insulin: Secondary | ICD-10-CM

## 2022-01-20 DIAGNOSIS — M1A09X1 Idiopathic chronic gout, multiple sites, with tophus (tophi): Secondary | ICD-10-CM

## 2022-01-20 DIAGNOSIS — R21 Rash and other nonspecific skin eruption: Secondary | ICD-10-CM

## 2022-01-20 DIAGNOSIS — M170 Bilateral primary osteoarthritis of knee: Secondary | ICD-10-CM

## 2022-01-20 DIAGNOSIS — G4733 Obstructive sleep apnea (adult) (pediatric): Secondary | ICD-10-CM

## 2022-01-20 DIAGNOSIS — Z8701 Personal history of pneumonia (recurrent): Secondary | ICD-10-CM

## 2022-01-20 MED ORDER — PREDNISONE 5 MG PO TABS: ORAL_TABLET | ORAL | 0 refills | Status: DC

## 2022-01-26 ENCOUNTER — Ambulatory Visit (INDEPENDENT_AMBULATORY_CARE_PROVIDER_SITE_OTHER): Payer: Medicaid Other | Admitting: Podiatry

## 2022-01-26 ENCOUNTER — Other Ambulatory Visit: Payer: Self-pay

## 2022-01-26 DIAGNOSIS — R1310 Dysphagia, unspecified: Secondary | ICD-10-CM | POA: Insufficient documentation

## 2022-01-26 DIAGNOSIS — M79674 Pain in right toe(s): Secondary | ICD-10-CM | POA: Diagnosis not present

## 2022-01-26 DIAGNOSIS — M79675 Pain in left toe(s): Secondary | ICD-10-CM | POA: Diagnosis not present

## 2022-01-26 DIAGNOSIS — Z860100 Personal history of colon polyps, unspecified: Secondary | ICD-10-CM | POA: Insufficient documentation

## 2022-01-26 DIAGNOSIS — Z1211 Encounter for screening for malignant neoplasm of colon: Secondary | ICD-10-CM | POA: Insufficient documentation

## 2022-01-26 DIAGNOSIS — B351 Tinea unguium: Secondary | ICD-10-CM

## 2022-01-26 DIAGNOSIS — E1142 Type 2 diabetes mellitus with diabetic polyneuropathy: Secondary | ICD-10-CM

## 2022-01-26 DIAGNOSIS — K5730 Diverticulosis of large intestine without perforation or abscess without bleeding: Secondary | ICD-10-CM | POA: Insufficient documentation

## 2022-01-27 ENCOUNTER — Telehealth: Payer: Self-pay

## 2022-01-27 NOTE — Telephone Encounter (Signed)
Patient was evaluated in the office on 01/20/2022. ?Patient was then referred to [facility] for transfer of care but referral was declined based on insurance. I called patient to advise and then offered a referral to Dr. Genene Churn and patient declined. Patient states she would like to come back to our office and get back on whatever medications or re-start methotrexate. Please advise.  ?

## 2022-01-29 ENCOUNTER — Telehealth: Payer: Self-pay | Admitting: Rheumatology

## 2022-01-29 ENCOUNTER — Other Ambulatory Visit: Payer: Self-pay | Admitting: Cardiovascular Disease

## 2022-01-29 NOTE — Telephone Encounter (Signed)
I returned patient's call.  I discussed with her that she would not be a candidate for DMARDs or biologic DMARD therapy due to recurrent infections.  She will have to stay on long-term prednisone.  Prednisone can be prescribed and managed by Dr.Avbuere.  She would not need any further appointments with [place]. ?Bo Merino, MD  ?

## 2022-01-29 NOTE — Telephone Encounter (Signed)
I returned patient's call and discussed that due to recurrent infections she would not be a good candidate for DMARDs or Biologics.  She will stay on prednisone long-term.  Which can be managed by her PCP.  She was in agreement.  She will not need any further follow-up visits.

## 2022-01-31 ENCOUNTER — Encounter: Payer: Self-pay | Admitting: Podiatry

## 2022-01-31 NOTE — Progress Notes (Signed)
?  Subjective:  ?Patient ID: Kathleen Kane, female    DOB: February 24, 1961,  MRN: [id number] ? ?Kathleen Kane presents to clinic today for at risk foot care with history of diabetic neuropathy and thick, elongated toenails R 3rd toe which are tender when wearing enclosed shoe gear. ? ?Patient states blood glucose was 156 mg/dl today.  ? ?New problem(s): None.  ? ?PCP is [name], MD , and last visit was January, 2023. ? ?Allergies  ?Allergen Reactions  ? Ace Inhibitors Cough  ? Lisinopril Cough  ? ? ?Review of Systems: Negative except as noted in the HPI. ? ?Objective: No changes noted in today's physical examination. ?Kathleen Kane is a pleasant 61 y.o. female in NAD. AAO X 3. ? ?Vascular Examination: ?CFT <3 seconds b/l LE. Palpable DP/PT pulses b/l LE. Digital hair absent b/l. Skin temperature gradient WNL b/l. No pain with calf compression b/l. No edema noted b/l. No cyanosis or clubbing noted b/l LE. ? ?Dermatological Examination: ?Pedal integument with normal turgor, texture and tone b/l LE. No open wounds b/l. No interdigital macerations b/l. Toenails R 3rd toe elongated, thickened, discolored with subungual debris. +Tenderness with dorsal palpation of nailplates. No hyperkeratotic or porokeratotic lesions present. Anonychia noted 1-5 left, R hallux, R 2nd toe, R 4th toe, and R 5th toe. Nailbed(s) epithelialized.  ? ?Musculoskeletal Examination: ?Muscle strength 5/5 to all lower extremity muscle groups bilaterally. No pain, crepitus or joint limitation noted with ROM bilateral LE. Pes planus deformity noted bilateral LE. Utilizes wheelchair for mobility assistance. ? ?Neurological Examination: ?Protective sensation diminished with 10g monofilament b/l. ? ?  Latest Ref Rng & Units 06/17/2021  ?  2:43 PM  ?Hemoglobin A1C  ?Hemoglobin-A1c 4.8 - 5.6 % 9.2    ?Assessment/Plan: ?1. Pain due to onychomycosis of toenails of both feet   ?2. Diabetic peripheral neuropathy associated with type 2 diabetes mellitus  ([place])   ?  ?-Examined patient. ?-Toenails R 3rd toe debrided in length and girth without iatrogenic bleeding with sterile nail nipper and dremel.  ?-Patient to report any pedal injuries to medical professional immediately. ?-Patient/POA to call should there be question/concern in the interim.  ? ?Return in about 3 months (around 04/28/2022). ? ?Marzetta Board, DPM  ?

## 2022-02-09 ENCOUNTER — Encounter (HOSPITAL_COMMUNITY): Payer: Self-pay | Admitting: Radiology

## 2022-02-15 ENCOUNTER — Other Ambulatory Visit: Payer: Self-pay | Admitting: Rheumatology

## 2022-02-15 MED ORDER — PREDNISONE 5 MG PO TABS: 10.0000 mg | ORAL_TABLET | Freq: Every day | ORAL | 0 refills | Status: DC

## 2022-02-15 NOTE — Telephone Encounter (Signed)
Dr. Estanislado Pandy spoke with the patient on 01/29/2022 regarding prednisone and advised the patient that her PCP can manage and prescribe the prednisone. Patient states she is scheduled with her PCP on 03/15/2022 at 3:45pm, as that is the soonest appointment available. Patient states she finished the weekly taper that was prescribed on 01/20/2022 and cannot wait until 03/15/2022 for more prednisone. Patient would like a prednisone prescription in the meantime until she can see her PCP. Please advise.  ?

## 2022-02-15 NOTE — Telephone Encounter (Signed)
Please review pended prescription and send to the pharmacy. Thanks!  ?

## 2022-02-15 NOTE — Telephone Encounter (Signed)
Please call in a prescription for prednisone 5 mg tablets, 2 tablets p.o. every morning for 30-day supply.

## 2022-02-15 NOTE — Telephone Encounter (Signed)
Patient called the office requesting a refill of Prednisone '5mg'$  to be sent to [facility] on [address]. ?

## 2022-02-16 NOTE — Telephone Encounter (Signed)
Attempted to contact patient to advise that prednisone prescription was sent to the pharmacy but unable to leave message, voicemail box is full.  ?

## 2022-03-07 ENCOUNTER — Ambulatory Visit (HOSPITAL_COMMUNITY)
Admission: EM | Admit: 2022-03-07 | Discharge: 2022-03-07 | Disposition: A | Discharge status: Home / Self Care | Payer: Medicaid Other | Attending: Emergency Medicine | Admitting: Emergency Medicine

## 2022-03-07 ENCOUNTER — Encounter (HOSPITAL_COMMUNITY): Payer: Self-pay | Admitting: *Deleted

## 2022-03-07 ENCOUNTER — Other Ambulatory Visit: Payer: Self-pay

## 2022-03-07 DIAGNOSIS — J069 Acute upper respiratory infection, unspecified: Secondary | ICD-10-CM

## 2022-03-07 MED ORDER — PREDNISONE 10 MG (21) PO TBPK: ORAL_TABLET | Freq: Every day | ORAL | 0 refills | Status: DC

## 2022-03-07 NOTE — ED Triage Notes (Signed)
Pt reports cough ,sore throat and body aches for one week. ?

## 2022-03-07 NOTE — ED Provider Notes (Signed)
[facility]    CSN: [id number] Arrival date & time: 03/07/22  1259      History   Chief Complaint Chief Complaint  Patient presents with   Cough   Sore Throat   Generalized Body Aches    HPI Kathleen Kane is a 61 y.o. female.   Patient presents with a nonproductive cough, sore throat, generalized headaches, nasal congestion, rhinorrhea and joint pain worse in the bilateral shoulders for 7 days.  Tolerating food and liquids.  No known sick contacts.  Has not attempted treatment of symptoms.  History of rheumatoid arthritis, diabetes mellitus, CHF, atrial fibrillation and sleep apnea.  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 AHI-6.8/hr, during REM-27.3/hr. RDI-25.0/hr, during REM-40.9/hr. 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   Dysphagia 01/26/2022   Personal history of colonic polyps 01/26/2022   Aortic atherosclerosis (HCC) 05/13/2021   Acute otalgia, right 11/26/2020   Arthralgia of right  temporomandibular joint 11/26/2020   Bilateral impacted cerumen 11/26/2020   HCAP (healthcare-associated pneumonia) 10/02/2018   CHF exacerbation (HCC) 10/01/2018   Community acquired pneumonia 10/01/2018   OSA (obstructive sleep apnea) 02/10/2017   Carpal tunnel syndrome, right 12/15/2016   Extensor tenosynovitis of wrist, right 12/15/2016   Bilateral primary osteoarthritis of knee 10/08/2015   Rheumatoid arthritis, 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   Acute on chronic diastolic CHF (congestive heart failure) (HCC) 08/08/2015   Morbid obesity (HCC) 05/06/2015   Essential hypertension 04/10/2015   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   Primary osteoarthritis involving multiple joints 04/09/2015   Elevated uric acid in blood 01/23/2014   High risk medication use 01/23/2014   Numbness and tingling in left hand 10/01/2013   Tobacco abuse 10/01/2013   Elevated CA-125 02/06/2013   Pelvic mass in female 01/29/2013   Diabetes mellitus (HCC) 08/10/2012   Atrial fibrillation (HCC)    Chronic diastolic CHF (congestive heart failure) (HCC)     Past Surgical History:  Procedure Laterality Date   CARDIOVASCULAR STRESS TEST  02/16/2013   no significant EKG changes with Lexiscan, normal LV function and normal wall function   CESAREAN SECTION     x2   DOPPLER ECHOCARDIOGRAPHY  01/24/2008   EF 55-60%, no diagnostic evidence of LV wall motion abnormalities, LV wall thickness was mild-moderately increased.   HAND SURGERY Right 2018   LAPAROTOMY Right 02/27/2013   Procedure: EXPLORATORY LAPAROTOMY, right salpingo-oopherectomy;  Surgeon: Laurette Schimke, MD;  Location: [facility];  Service: Gynecology;  Laterality: Right;   left breast cyst     SALPINGOOPHORECTOMY Right 02/27/2013   Procedure: SALPINGO OOPHORECTOMY;  Surgeon: Laurette Schimke, MD;  Location: [facility];  Service: Gynecology;  Laterality: Right;   TUBAL LIGATION Bilateral 1989    OB History   No obstetric history on  file.      Home Medications    Prior to Admission medications   Medication Sig Start Date End Date Taking? Authorizing Provider  Accu-Chek FastClix Lancets MISC Apply topically. 08/04/20   [provider]  ACCU-CHEK GUIDE test strip  08/14/20   [provider]  allopurinol (ZYLOPRIM) 100 MG tablet Take 100 mg by mouth daily. 03/10/21   [provider]  apixaban (ELIQUIS) 5 MG TABS tablet TAKE 1 TABLET BY MOUTH TWICE A DAY 01/11/22   Wendall Stade, MD  atorvastatin (LIPITOR) 20 MG tablet TAKE 1 TABLET (20 MG TOTAL) BY MOUTH DAILY. PLEASE SCHEDULE YEARLY APPOINTMENT FOR FUTURE REFILLS. 1ST ATTEMPT. THANK YOU 01/29/22   Wendall Stade, MD  B-D ULTRAFINE III SHORT PEN 31G X 8 MM MISC Inject into the skin 2 (two) times daily. 06/30/20   [provider]  benzonatate (TESSALON) 100 MG capsule Take 1 capsule (100 mg total) by mouth 3 (three) times daily as needed for cough. Patient not taking: Reported on 01/20/2022 01/11/22   Zenia Resides, MD  blood glucose meter kit and supplies Dispense based on patient and insurance preference. Use up to four times daily as directed. (FOR ICD-10 E10.9, E11.9). 10/03/18   Glade Lloyd, MD  cephALEXin (KEFLEX) 500 MG capsule Take 1 capsule (500 mg total) by mouth 3 (three) times daily. Patient not taking: Reported on 01/20/2022 10/21/21   Wallis Bamberg, PA-C  clobetasol cream (TEMOVATE) 0.05 % Apply 1 application topically 2 (two) times daily. 07/22/21   Gearldine Bienenstock, PA-C  diclofenac sodium (VOLTAREN) 1 % GEL Apply 2 g topically 4 (four) times daily. 04/14/18   Kirichenko, Tatyana, PA-C  fluconazole (DIFLUCAN) 150 MG tablet Take 1 tablet (150 mg total) by mouth once a week. Patient not taking: Reported on 01/20/2022 10/21/21   Wallis Bamberg, PA-C  folic acid (FOLVITE) 1 MG tablet Take 1 mg by mouth daily. 08/19/17   [provider]  furosemide (LASIX) 40 MG tablet TAKE 1 TABLET BY MOUTH TWICE A DAY Patient taking  differently: Take 40 mg by mouth daily. 10/29/19   Wendall Stade, MD  gabapentin (NEURONTIN) 300 MG capsule Take 300 mg by mouth 3 (three) times daily.    [provider]  hydrOXYzine (ATARAX) 25 MG tablet Take 0.5-1 tablets (12.5-25 mg total) by mouth every 8 (eight) hours as needed for itching. 10/21/21   Wallis Bamberg, PA-C  ibuprofen (ADVIL) 800 MG tablet Take 800 mg by mouth as needed.    [provider]  losartan (COZAAR) 25 MG tablet TAKE 1 TABLET BY MOUTH EVERY DAY 10/29/19   Wendall Stade, MD  metFORMIN (GLUCOPHAGE) 500 MG tablet Take 2 tablets (1,000 mg total) by mouth 2 (two) times daily. 10/03/18   Glade Lloyd, MD  Methotrexate, PF, (RASUVO) 17.5 MG/0.35ML SOAJ Inject  1 pen (17.5 mg) into the skin once a week. Patient not taking: Reported on 01/20/2022 07/24/21   Gearldine Bienenstock, PA-C  metoprolol tartrate (LOPRESSOR) 25 MG tablet Take 0.5 tablets (12.5 mg total) by mouth 2 (two) times daily. 02/07/19   Wendall Stade, MD  NOVOLOG MIX 70/30  FLEXPEN (70-30) 100 UNIT/ML FlexPen Inject 0.4 mLs (40 Units total) into the skin 2 (two) times daily with a meal. 10/03/18   Hanley Ben, Kshitiz, MD  ORENCIA CLICKJECT 125 MG/ML SOAJ INJECT THE CONTENTS OF 1 PEN INTO THE SKIN EVERY 7 DAYS. Patient not taking: Reported on 01/20/2022 09/16/21   Gearldine Bienenstock, PA-C  polyethylene glycol-electrolytes (NULYTELY) 420 g solution See admin instructions. Patient not taking: Reported on 07/22/2021 07/07/21   [provider]  predniSONE (DELTASONE) 5 MG tablet Take 2 tablets (10 mg total) by mouth daily with breakfast. 02/15/22   Pollyann Savoy, MD  tiZANidine (ZANAFLEX) 4 MG tablet Take 4 mg by mouth 2 (two) times daily as needed for muscle spasms. 03/09/21   [provider]  triamcinolone cream (KENALOG) 0.5 % Apply topically 2 (two) times daily as needed. 06/24/21   [provider]    Family History Family History  Problem Relation Age of Onset   Hypertension Mother     Diabetes Mother    Throat cancer Mother    Hypertension Father    Diabetes Paternal Grandmother    Diabetes Sister    Heart Problems Sister    Kidney failure Sister    Hypertension Brother    Diabetes Brother    Healthy Son    Autoimmune disease Daughter     Social History Social History   Tobacco Use   Smoking status: Former    Packs/day: 0.75    Years: 24.00    Pack years: 18.00    Types: Cigarettes    Quit date: 06/08/2014    Years since quitting: 7.7    Passive exposure: Never   Smokeless tobacco: Never   Tobacco comments:    smoking cessation info given.   Vaping Use   Vaping Use: Former  Substance Use Topics   Alcohol use: No    Alcohol/week: 0.0 standard drinks   Drug use: No     Allergies   Ace inhibitors and Lisinopril   Review of Systems Review of Systems  Constitutional: Negative.   HENT:  Positive for congestion, rhinorrhea and sore throat. Negative for dental problem, drooling, ear discharge, ear pain, facial swelling, hearing loss, mouth sores, nosebleeds, postnasal drip, sinus pressure, sinus pain, sneezing, tinnitus, trouble swallowing and voice change.   Respiratory:  Positive for cough. Negative for apnea, choking, chest tightness, shortness of breath and stridor.   Cardiovascular: Negative.   Gastrointestinal: Negative.   Skin: Negative.   Neurological:  Positive for headaches. Negative for dizziness, tremors, seizures, syncope, facial asymmetry, speech difficulty, weakness, light-headedness and numbness.    Physical Exam Triage Vital Signs ED Triage Vitals  Enc Vitals Group     BP 03/07/22 1500 (!) 149/61     Pulse Rate 03/07/22 1500 82     Resp 03/07/22 1500 20     Temp 03/07/22 1500 98.9 F (37.2 C)     Temp src --      SpO2 03/07/22 1500 96 %     Weight --      Height --      Head Circumference --      Peak Flow --      Pain Score 03/07/22 1458 8     Pain Loc --      Pain Edu? --      Excl. in GC? --    No data  found.  Updated Vital Signs BP (!) 149/61   Pulse 82   Temp 98.9 F (37.2 C)  Resp 20   LMP 10/28/2013   SpO2 96%   Visual Acuity Right Eye Distance:   Left Eye Distance:   Bilateral Distance:    Right Eye Near:   Left Eye Near:    Bilateral Near:     Physical Exam Constitutional:      Appearance: Normal appearance. She is well-developed.  HENT:     Head: Normocephalic.     Right Ear: Tympanic membrane and ear canal normal.     Left Ear: Ear canal normal.     Nose: Congestion and rhinorrhea present.     Mouth/Throat:     Mouth: Mucous membranes are moist.     Pharynx: Posterior oropharyngeal erythema present.     Tonsils: No tonsillar exudate. 0 on the right. 0 on the left.  Cardiovascular:     Rate and Rhythm: Normal rate and regular rhythm.     Heart sounds: Normal heart sounds.  Pulmonary:     Effort: Pulmonary effort is normal.     Breath sounds: Normal breath sounds.  Musculoskeletal:        General: Normal range of motion.     Cervical back: Normal range of motion.  Skin:    General: Skin is warm and dry.  Neurological:     General: No focal deficit present.     Mental Status: She is alert and oriented to person, place, and time.  Psychiatric:        Mood and Affect: Mood normal.        Behavior: Behavior normal.     UC Treatments / Results  Labs (all labs ordered are listed, but only abnormal results are displayed) Labs Reviewed - No data to display  EKG   Radiology No results found.  Procedures Procedures (including critical care time)  Medications Ordered in UC Medications - No data to display  Initial Impression / Assessment and Plan / UC Course  I have reviewed the triage vital signs and the nursing notes.  Pertinent labs & imaging results that were available during my care of the patient were reviewed by me and considered in my medical decision making (see chart for details).  Viral URI with cough  Vital signs are stable, O2  saturation 96% on room air, lungs are clear to auscultation, will defer imaging at this time, discussed with patient, etiology is symptoms are most likely viral, prednisone 60 mg taper as body aches/joint aching is most worrisome symptom, advised patient to stop daily prednisone while taper is being used, may use over-the-counter medications for additional supportive care of remaining symptoms, patient has upcoming PCP appointment on Mar 15, 2022, may revaluate symptoms at that time, may follow-up with urgent care as needed Final Clinical Impressions(s) / UC Diagnoses   Final diagnoses:  None   Discharge Instructions   None    ED Prescriptions   None    PDMP not reviewed this encounter.   Valinda Hoar, NP 03/07/22 1555

## 2022-03-07 NOTE — Discharge Instructions (Signed)
Your symptoms today are most likely being caused by a virus and should steadily improve in time it can take up to 7 to 10 days before you truly start to see a turnaround however things will get better ? ?Starting tomorrow take prednisone course every morning as directed, once steroid medication is complete you may return to taking your daily prescribed dosage ? ?You will be at your primary care appointment by the time you finish this medicine therefore they will take over treatment ? ?May use any of the following below for additional management of your symptoms ?   ?You can take Tylenol as needed for fever reduction and pain relief. ?  ?For cough: honey 1/2 to 1 teaspoon (you can dilute the honey in water or another fluid).  You can also use guaifenesin and dextromethorphan for cough. You can use a humidifier for chest congestion and cough.  If you don't have a humidifier, you can sit in the bathroom with the hot shower running.    ?  ?For sore throat: try warm salt water gargles, cepacol lozenges, throat spray, warm tea or water with lemon/honey, popsicles or ice, or OTC cold relief medicine for throat discomfort. ?  ?For congestion: take a daily anti-histamine like Zyrtec, Claritin. You can also use Flonase 1-2 sprays in each nostril daily. ?  ?It is important to stay hydrated: drink plenty of fluids (water, gatorade/powerade/pedialyte, juices, or teas) to keep your throat moisturized and help further relieve irritation/discomfort.  ?

## 2022-03-09 ENCOUNTER — Other Ambulatory Visit: Payer: Self-pay

## 2022-03-09 ENCOUNTER — Emergency Department (HOSPITAL_COMMUNITY)
Admission: EM | Admit: 2022-03-09 | Discharge: 2022-03-10 | Disposition: A | Discharge status: Home / Self Care | Payer: Medicaid Other | Attending: Emergency Medicine | Admitting: Emergency Medicine

## 2022-03-09 ENCOUNTER — Emergency Department (HOSPITAL_COMMUNITY): Payer: Medicaid Other

## 2022-03-09 ENCOUNTER — Encounter (HOSPITAL_COMMUNITY): Payer: Self-pay | Admitting: Emergency Medicine

## 2022-03-09 DIAGNOSIS — I4891 Unspecified atrial fibrillation: Secondary | ICD-10-CM | POA: Diagnosis not present

## 2022-03-09 DIAGNOSIS — I5030 Unspecified diastolic (congestive) heart failure: Secondary | ICD-10-CM | POA: Diagnosis not present

## 2022-03-09 DIAGNOSIS — R059 Cough, unspecified: Secondary | ICD-10-CM | POA: Insufficient documentation

## 2022-03-09 DIAGNOSIS — E119 Type 2 diabetes mellitus without complications: Secondary | ICD-10-CM | POA: Diagnosis not present

## 2022-03-09 DIAGNOSIS — R0602 Shortness of breath: Secondary | ICD-10-CM | POA: Diagnosis not present

## 2022-03-09 DIAGNOSIS — Z7901 Long term (current) use of anticoagulants: Secondary | ICD-10-CM | POA: Insufficient documentation

## 2022-03-09 DIAGNOSIS — Z7984 Long term (current) use of oral hypoglycemic drugs: Secondary | ICD-10-CM | POA: Insufficient documentation

## 2022-03-09 DIAGNOSIS — R079 Chest pain, unspecified: Secondary | ICD-10-CM | POA: Diagnosis not present

## 2022-03-09 DIAGNOSIS — R6883 Chills (without fever): Secondary | ICD-10-CM | POA: Diagnosis not present

## 2022-03-09 DIAGNOSIS — R042 Hemoptysis: Secondary | ICD-10-CM | POA: Insufficient documentation

## 2022-03-09 DIAGNOSIS — Z79899 Other long term (current) drug therapy: Secondary | ICD-10-CM | POA: Diagnosis not present

## 2022-03-09 LAB — CBC
HCT: 32.8 % — ABNORMAL LOW (ref 36.0–46.0)
Hemoglobin: 10.3 g/dL — ABNORMAL LOW (ref 12.0–15.0)
MCH: 26.8 pg (ref 26.0–34.0)
MCHC: 31.4 g/dL (ref 30.0–36.0)
MCV: 85.4 fL (ref 80.0–100.0)
Platelets: 309 10*3/uL (ref 150–400)
RBC: 3.84 MIL/uL — ABNORMAL LOW (ref 3.87–5.11)
RDW: 15.9 % — ABNORMAL HIGH (ref 11.5–15.5)
WBC: 11.4 10*3/uL — ABNORMAL HIGH (ref 4.0–10.5)
nRBC: 0.4 % — ABNORMAL HIGH (ref 0.0–0.2)

## 2022-03-09 LAB — BASIC METABOLIC PANEL
Anion gap: 8 (ref 5–15)
BUN: 21 mg/dL (ref 8–23)
CO2: 21 mmol/L — ABNORMAL LOW (ref 22–32)
Calcium: 9.6 mg/dL (ref 8.9–10.3)
Chloride: 111 mmol/L (ref 98–111)
Creatinine, Ser: 0.88 mg/dL (ref 0.44–1.00)
GFR, Estimated: >60 mL/min (ref >60)
Glucose, Bld: 153 mg/dL — ABNORMAL HIGH (ref 70–99)
Potassium: 4.2 mmol/L (ref 3.5–5.1)
Sodium: 140 mmol/L (ref 135–145)

## 2022-03-09 LAB — TROPONIN I (HIGH SENSITIVITY)
Troponin I (High Sensitivity): 14 ng/L (ref <18)
Troponin I (High Sensitivity): 19 ng/L — ABNORMAL HIGH (ref <18)

## 2022-03-09 LAB — BRAIN NATRIURETIC PEPTIDE: B Natriuretic Peptide: 407 pg/mL — ABNORMAL HIGH (ref 0.0–100.0)

## 2022-03-09 MED ORDER — SODIUM CHLORIDE 0.9 % IV BOLUS
500.0000 mL | Freq: Once | INTRAVENOUS | Status: AC
  Administered 2022-03-09: 500 mL via INTRAVENOUS

## 2022-03-09 MED ORDER — IOHEXOL 300 MG/ML  SOLN
75.0000 mL | Freq: Once | INTRAMUSCULAR | Status: AC | PRN
  Administered 2022-03-09: 75 mL via INTRAVENOUS

## 2022-03-09 NOTE — ED Notes (Signed)
Returned from ct at this time ?

## 2022-03-09 NOTE — ED Notes (Signed)
Per pt hereto be evaluated for coughing up blood, chest pain, irregular heart rate, headache, elevated bp, elevated CBG, and SOB x 1 week. Per pt she is still having irregular heart rate, chest tightness in middle of chest, and coughing of blood, SOB at this time ?

## 2022-03-09 NOTE — ED Notes (Signed)
Assumed care of pt at this time.

## 2022-03-09 NOTE — ED Provider Triage Note (Signed)
Emergency Medicine Provider Triage Evaluation Note ? ?Kathleen Kane , a 61 y.o. female  was evaluated in triage.  Pt complains of dark stools and coughing up small amounts of blood as of yesterday.  Hx of chronic tobacco use.  On Eliquis due to A-fib.  Hx of CHF, HTN, DM 2.  Complaining of fatigue and mild chest discomfort as well.  Denies fever, nausea, vomiting, diarrhea. ? ?Review of Systems  ?Positive: As above ?Negative: As above ? ?Physical Exam  ?BP (!) 164/94 (BP Location: Right Arm)   Pulse 96   Temp 97.9 ?F (36.6 ?C) (Oral)   Resp (!) 22   Ht '5\' 2"'$  (1.575 m)   Wt 104 kg   LMP 10/28/2013   SpO2 94%   BMI 41.94 kg/m?  ?Gen:   Awake, no distress   ?Resp:  Normal effort CTAB ?MSK:   Moves extremities without difficulty  ?Other:  Chest non-TTP.  Abdomen soft nontender.  CRT less than less than 2 seconds bilaterally. ? ?Medical Decision Making  ?Medically screening exam initiated at 5:40 PM.  Appropriate orders placed.  Kathleen Kane was informed that the remainder of the evaluation will be completed by another provider, this initial triage assessment does not replace that evaluation, and the importance of remaining in the ED until their evaluation is complete. ? ?Labs, imaging, EKG ordered ?  ?Kathleen Rome, PA-C ?16/10/96 1756 ? ?

## 2022-03-09 NOTE — ED Notes (Signed)
NT took pt on a short walk with pulse oximetry. Pt O2 dropped to a 92 at its lowest with 35 breaths per minute. Pt complained of being light headed but never stopped or slowed down while ambulating.  ?

## 2022-03-09 NOTE — ED Triage Notes (Signed)
Pt here complaining of coughing up blood starting yesterday. Pt complaining of SHOB and chest tightness that started yesterday at 11 am. Pt reports coughing up tiny clots. Pt with hx of CHF and A fib- taking Eliquis.Pt reports black stools that started yesterday as well.  VSS NAD at this time.  ?

## 2022-03-09 NOTE — Discharge Instructions (Addendum)
You were seen in the ER for bloody sputum. ? ?CT scan reveals that you have chronic lung disease, but no evidence of any infection, tumor. ?We recommend that you discontinue your Eliquis, the blood thinner for 3 days. ? ?Take the medications prescribed.  ? ?Please return to the ER if the cough gets more bloody, you start having worsening shortness of breath. ?Please follow-up with your primary care doctor in 1 week. ? ?

## 2022-03-09 NOTE — ED Provider Notes (Addendum)
?[place] ?Provider Note ? ? ?CSN: [id number] ?Arrival date & time: 03/09/22  1634 ? ?  ? ?History ? ?Chief Complaint  ?Patient presents with  ? Shortness of Breath  ? Hemoptysis  ? ? ?Kathleen Kane is a 61 y.o. female. ? ?HPI ? ?  ? ?Pt presents today for SOB, hemoptysis, and chest pain that started yesterday. Pt does have a past medical history of A fib, Diastolic HF, but does not have any past pulmonary hx. Pt states that since last Monday (03/01/22) she had been experiencing a dry cough, chills, and SOB. Pt went to Urgent care Sunday (03/07/22) and was dx with Viral URI and was not given any meds. Yesterday, she started to experience hemoptysis, chest pain, and SOB which worsened today. Denies nausea, vomiting, being around sick contacts, night sweats, weight loss, palpitations, or recent travel hx.   ? ?Home Medications ?Prior to Admission medications   ?Medication Sig Start Date End Date Taking? Authorizing Provider  ?Accu-Chek FastClix Lancets MISC Apply topically. 08/04/20   [provider]  ?ACCU-CHEK GUIDE test strip  08/14/20   [provider]  ?allopurinol (ZYLOPRIM) 100 MG tablet Take 100 mg by mouth daily. 03/10/21   [provider]  ?apixaban (ELIQUIS) 5 MG TABS tablet TAKE 1 TABLET BY MOUTH TWICE A DAY 01/11/22   Josue Hector, MD  ?atorvastatin (LIPITOR) 20 MG tablet TAKE 1 TABLET (20 MG TOTAL) BY MOUTH DAILY. PLEASE SCHEDULE YEARLY APPOINTMENT FOR FUTURE REFILLS. 1ST ATTEMPT. THANK YOU 01/29/22   Josue Hector, MD  ?B-D ULTRAFINE III SHORT PEN 31G X 8 MM MISC Inject into the skin 2 (two) times daily. 06/30/20   [provider]  ?benzonatate (TESSALON) 100 MG capsule Take 1 capsule (100 mg total) by mouth 3 (three) times daily as needed for cough. ?Patient not taking: Reported on 01/20/2022 01/11/22   Barrett Henle, MD  ?blood glucose meter kit and supplies Dispense based on patient and insurance preference. Use up to four  times daily as directed. (FOR ICD-10 E10.9, E11.9). 10/03/18   Aline August, MD  ?cephALEXin (KEFLEX) 500 MG capsule Take 1 capsule (500 mg total) by mouth 3 (three) times daily. ?Patient not taking: Reported on 01/20/2022 10/21/21   Jaynee Eagles, PA-C  ?clobetasol cream (TEMOVATE) 7.37 % Apply 1 application topically 2 (two) times daily. 07/22/21   Ofilia Neas, PA-C  ?diclofenac sodium (VOLTAREN) 1 % GEL Apply 2 g topically 4 (four) times daily. 04/14/18   Kirichenko, [place] Rocker, PA-C  ?fluconazole (DIFLUCAN) 150 MG tablet Take 1 tablet (150 mg total) by mouth once a week. ?Patient not taking: Reported on 01/20/2022 10/21/21   Jaynee Eagles, PA-C  ?folic acid (FOLVITE) 1 MG tablet Take 1 mg by mouth daily. 08/19/17   [provider]  ?furosemide (LASIX) 40 MG tablet TAKE 1 TABLET BY MOUTH TWICE A DAY ?Patient taking differently: Take 40 mg by mouth daily. 10/29/19   Josue Hector, MD  ?gabapentin (NEURONTIN) 300 MG capsule Take 300 mg by mouth 3 (three) times daily.    [provider]  ?hydrOXYzine (ATARAX) 25 MG tablet Take 0.5-1 tablets (12.5-25 mg total) by mouth every 8 (eight) hours as needed for itching. 10/21/21   Jaynee Eagles, PA-C  ?ibuprofen (ADVIL) 800 MG tablet Take 800 mg by mouth as needed.    [provider]  ?losartan (COZAAR) 25 MG tablet TAKE 1 TABLET BY MOUTH EVERY DAY 10/29/19   Jenkins Rouge  C, MD  ?metFORMIN (GLUCOPHAGE) 500 MG tablet Take 2 tablets (1,000 mg total) by mouth 2 (two) times daily. 10/03/18   Aline August, MD  ?Methotrexate, PF, (RASUVO) 17.5 MG/0.35ML SOAJ Inject  1 pen (17.5 mg) into the skin once a week. ?Patient not taking: Reported on 01/20/2022 07/24/21   Ofilia Neas, PA-C  ?metoprolol tartrate (LOPRESSOR) 25 MG tablet Take 0.5 tablets (12.5 mg total) by mouth 2 (two) times daily. 02/07/19   Josue Hector, MD  ?NOVOLOG MIX 70/30 FLEXPEN (70-30) 100 UNIT/ML FlexPen Inject 0.4 mLs (40 Units total) into the skin 2 (two) times daily with a meal.  10/03/18   Aline August, MD  ?Maureen Chatters CLICKJECT 277 MG/ML SOAJ INJECT THE CONTENTS OF 1 PEN INTO THE SKIN EVERY 7 DAYS. ?Patient not taking: Reported on 01/20/2022 09/16/21   Ofilia Neas, PA-C  ?polyethylene glycol-electrolytes (NULYTELY) 420 g solution See admin instructions. ?Patient not taking: Reported on 07/22/2021 07/07/21   [provider]  ?predniSONE (DELTASONE) 5 MG tablet Take 2 tablets (10 mg total) by mouth daily with breakfast. 02/15/22   Bo Merino, MD  ?predniSONE (STERAPRED UNI-PAK 21 TAB) 10 MG (21) TBPK tablet Take by mouth daily. Take 6 tabs by mouth daily  for 2 days, then 5 tabs for 2 days, then 4 tabs for 2 days, then 3 tabs for 2 days, 2 tabs for 2 days, then 1 tab by mouth daily for 2 days 03/07/22   Hans Eden, NP  ?tiZANidine (ZANAFLEX) 4 MG tablet Take 4 mg by mouth 2 (two) times daily as needed for muscle spasms. 03/09/21   [provider]  ?triamcinolone cream (KENALOG) 0.5 % Apply topically 2 (two) times daily as needed. 06/24/21   [provider]  ?   ? ?Allergies    ?Ace inhibitors and Lisinopril   ? ?Review of Systems   ?Review of Systems ? ?Physical Exam ?Updated Vital Signs ?BP (!) 147/91   Pulse 88   Temp 98.1 ?F (36.7 ?C) (Oral)   Resp (!) 21   Ht 5' 2" (1.575 m)   Wt 104 kg   LMP 10/28/2013   SpO2 93%   BMI 41.94 kg/m?  ?Physical Exam ? ?ED Results / Procedures / Treatments   ?Labs ?(all labs ordered are listed, but only abnormal results are displayed) ?Labs Reviewed  ?BASIC METABOLIC PANEL - Abnormal; Notable for the following components:  ?    Result Value  ? CO2 21 (*)   ? Glucose, Bld 153 (*)   ? All other components within normal limits  ?CBC - Abnormal; Notable for the following components:  ? WBC 11.4 (*)   ? RBC 3.84 (*)   ? Hemoglobin 10.3 (*)   ? HCT 32.8 (*)   ? RDW 15.9 (*)   ? nRBC 0.4 (*)   ? All other components within normal limits  ?BRAIN NATRIURETIC PEPTIDE - Abnormal; Notable for the following components:  ? B  Natriuretic Peptide 407.0 (*)   ? All other components within normal limits  ?TROPONIN I (HIGH SENSITIVITY) - Abnormal; Notable for the following components:  ? Troponin I (High Sensitivity) 19 (*)   ? All other components within normal limits  ?TROPONIN I (HIGH SENSITIVITY)  ? ? ?EKG ?EKG Interpretation ? ?Date/Time:  Tuesday Mar 09 2022 18:42:01 EDT ?Ventricular Rate:  92 ?PR Interval:    ?QRS Duration: 89 ?QT Interval:  430 ?QTC Calculation: 532 ?R Axis:   14 ?Text Interpretation: Atrial fibrillation  Abnormal R-wave progression, late transition Prolonged QT interval No acute changes No significant change since last tracing Confirmed by Varney Biles [phone]) on 03/09/2022 7:56:04 PM ? ?Radiology ?DG Chest 2 View ? ?Result Date: 03/09/2022 ?CLINICAL DATA:  Hemoptysis since yesterday, short of breath, chest tightness EXAM: CHEST - 2 VIEW COMPARISON:  01/11/2022 FINDINGS: Frontal and lateral views of the chest demonstrate enlarged cardiac silhouette. Since the prior exam, there is interval increase in the diffuse interstitial and ground-glass opacity seen previously. No effusion or pneumothorax. No acute bony abnormalities. IMPRESSION: 1. Constellation of findings favoring worsening volume status and congestive heart failure. Underlying atypical infection or pulmonary hemorrhage cannot be excluded. Electronically Signed   By: Randa Ngo M.D.   On: 03/09/2022 18:19  ? ?CT Chest W Contrast ? ?Result Date: 03/09/2022 ?CLINICAL DATA:  Hemoptysis. EXAM: CT CHEST WITH CONTRAST TECHNIQUE: Multidetector CT imaging of the chest was performed during intravenous contrast administration. RADIATION DOSE REDUCTION: This exam was performed according to the departmental dose-optimization program which includes automated exposure control, adjustment of the mA and/or kV according to patient size and/or use of iterative reconstruction technique. CONTRAST:  68m OMNIPAQUE IOHEXOL 300 MG/ML  SOLN COMPARISON:  October 01, 2018 FINDINGS:  Cardiovascular: There is moderate to marked severity calcification of the thoracic aorta, without evidence of aortic aneurysm or dissection. There is mild cardiomegaly with marked severity coronary artery calcification. No

## 2022-03-09 NOTE — ED Notes (Signed)
Pt transported to ct at this time 

## 2022-03-10 DIAGNOSIS — E1165 Type 2 diabetes mellitus with hyperglycemia: Secondary | ICD-10-CM | POA: Insufficient documentation

## 2022-03-10 DIAGNOSIS — Z794 Long term (current) use of insulin: Secondary | ICD-10-CM | POA: Insufficient documentation

## 2022-03-10 DIAGNOSIS — R1031 Right lower quadrant pain: Secondary | ICD-10-CM | POA: Insufficient documentation

## 2022-03-10 DIAGNOSIS — D72829 Elevated white blood cell count, unspecified: Secondary | ICD-10-CM | POA: Insufficient documentation

## 2022-03-10 DIAGNOSIS — R112 Nausea with vomiting, unspecified: Secondary | ICD-10-CM | POA: Insufficient documentation

## 2022-03-10 DIAGNOSIS — Z7984 Long term (current) use of oral hypoglycemic drugs: Secondary | ICD-10-CM | POA: Insufficient documentation

## 2022-03-10 DIAGNOSIS — Z7901 Long term (current) use of anticoagulants: Secondary | ICD-10-CM | POA: Insufficient documentation

## 2022-03-10 DIAGNOSIS — I110 Hypertensive heart disease with heart failure: Secondary | ICD-10-CM | POA: Insufficient documentation

## 2022-03-10 DIAGNOSIS — Z87891 Personal history of nicotine dependence: Secondary | ICD-10-CM | POA: Insufficient documentation

## 2022-03-10 DIAGNOSIS — R000 Tachycardia, unspecified: Secondary | ICD-10-CM | POA: Insufficient documentation

## 2022-03-10 DIAGNOSIS — I5033 Acute on chronic diastolic (congestive) heart failure: Secondary | ICD-10-CM | POA: Insufficient documentation

## 2022-03-11 ENCOUNTER — Other Ambulatory Visit: Payer: Self-pay

## 2022-03-11 ENCOUNTER — Emergency Department (HOSPITAL_COMMUNITY)
Admission: EM | Admit: 2022-03-11 | Discharge: 2022-03-11 | Disposition: A | Discharge status: Home / Self Care | Payer: Medicaid Other | Source: Home / Self Care | Attending: Student | Admitting: Student

## 2022-03-11 ENCOUNTER — Emergency Department (HOSPITAL_COMMUNITY): Payer: Medicaid Other

## 2022-03-11 ENCOUNTER — Encounter (HOSPITAL_COMMUNITY): Payer: Self-pay | Admitting: Emergency Medicine

## 2022-03-11 ENCOUNTER — Inpatient Hospital Stay (HOSPITAL_COMMUNITY)
Admission: EM | Admit: 2022-03-11 | Discharge: 2022-03-15 | DRG: 193 | Disposition: A | Discharge status: Home / Self Care | Payer: Medicaid Other | Attending: Internal Medicine | Admitting: Internal Medicine

## 2022-03-11 ENCOUNTER — Encounter (HOSPITAL_COMMUNITY): Payer: Self-pay

## 2022-03-11 DIAGNOSIS — I4891 Unspecified atrial fibrillation: Secondary | ICD-10-CM | POA: Diagnosis present

## 2022-03-11 DIAGNOSIS — Z888 Allergy status to other drugs, medicaments and biological substances status: Secondary | ICD-10-CM | POA: Diagnosis not present

## 2022-03-11 DIAGNOSIS — M069 Rheumatoid arthritis, unspecified: Secondary | ICD-10-CM | POA: Diagnosis present

## 2022-03-11 DIAGNOSIS — Z20822 Contact with and (suspected) exposure to covid-19: Secondary | ICD-10-CM | POA: Diagnosis present

## 2022-03-11 DIAGNOSIS — Z8249 Family history of ischemic heart disease and other diseases of the circulatory system: Secondary | ICD-10-CM

## 2022-03-11 DIAGNOSIS — Z87891 Personal history of nicotine dependence: Secondary | ICD-10-CM | POA: Diagnosis not present

## 2022-03-11 DIAGNOSIS — I10 Essential (primary) hypertension: Secondary | ICD-10-CM | POA: Diagnosis not present

## 2022-03-11 DIAGNOSIS — E119 Type 2 diabetes mellitus without complications: Secondary | ICD-10-CM

## 2022-03-11 DIAGNOSIS — J8410 Pulmonary fibrosis, unspecified: Secondary | ICD-10-CM

## 2022-03-11 DIAGNOSIS — I110 Hypertensive heart disease with heart failure: Secondary | ICD-10-CM | POA: Diagnosis present

## 2022-03-11 DIAGNOSIS — D638 Anemia in other chronic diseases classified elsewhere: Secondary | ICD-10-CM | POA: Diagnosis present

## 2022-03-11 DIAGNOSIS — J9601 Acute respiratory failure with hypoxia: Secondary | ICD-10-CM | POA: Diagnosis present

## 2022-03-11 DIAGNOSIS — G4733 Obstructive sleep apnea (adult) (pediatric): Secondary | ICD-10-CM | POA: Diagnosis present

## 2022-03-11 DIAGNOSIS — Z794 Long term (current) use of insulin: Secondary | ICD-10-CM | POA: Diagnosis not present

## 2022-03-11 DIAGNOSIS — R1030 Lower abdominal pain, unspecified: Secondary | ICD-10-CM

## 2022-03-11 DIAGNOSIS — I509 Heart failure, unspecified: Secondary | ICD-10-CM

## 2022-03-11 DIAGNOSIS — D509 Iron deficiency anemia, unspecified: Secondary | ICD-10-CM | POA: Diagnosis present

## 2022-03-11 DIAGNOSIS — I482 Chronic atrial fibrillation, unspecified: Secondary | ICD-10-CM | POA: Diagnosis not present

## 2022-03-11 DIAGNOSIS — Z7984 Long term (current) use of oral hypoglycemic drugs: Secondary | ICD-10-CM | POA: Diagnosis not present

## 2022-03-11 DIAGNOSIS — I5032 Chronic diastolic (congestive) heart failure: Secondary | ICD-10-CM | POA: Diagnosis present

## 2022-03-11 DIAGNOSIS — I4821 Permanent atrial fibrillation: Secondary | ICD-10-CM | POA: Diagnosis present

## 2022-03-11 DIAGNOSIS — M1A09X1 Idiopathic chronic gout, multiple sites, with tophus (tophi): Secondary | ICD-10-CM | POA: Diagnosis present

## 2022-03-11 DIAGNOSIS — Z79899 Other long term (current) drug therapy: Secondary | ICD-10-CM

## 2022-03-11 DIAGNOSIS — N179 Acute kidney failure, unspecified: Secondary | ICD-10-CM | POA: Diagnosis present

## 2022-03-11 DIAGNOSIS — J209 Acute bronchitis, unspecified: Secondary | ICD-10-CM | POA: Diagnosis not present

## 2022-03-11 DIAGNOSIS — I2510 Atherosclerotic heart disease of native coronary artery without angina pectoris: Secondary | ICD-10-CM | POA: Diagnosis present

## 2022-03-11 DIAGNOSIS — Z7901 Long term (current) use of anticoagulants: Secondary | ICD-10-CM | POA: Diagnosis not present

## 2022-03-11 DIAGNOSIS — Z833 Family history of diabetes mellitus: Secondary | ICD-10-CM | POA: Diagnosis not present

## 2022-03-11 DIAGNOSIS — Z6841 Body Mass Index (BMI) 40.0 and over, adult: Secondary | ICD-10-CM

## 2022-03-11 DIAGNOSIS — J810 Acute pulmonary edema: Secondary | ICD-10-CM

## 2022-03-11 DIAGNOSIS — I5033 Acute on chronic diastolic (congestive) heart failure: Secondary | ICD-10-CM | POA: Diagnosis present

## 2022-03-11 DIAGNOSIS — J189 Pneumonia, unspecified organism: Secondary | ICD-10-CM | POA: Diagnosis present

## 2022-03-11 DIAGNOSIS — R5381 Other malaise: Secondary | ICD-10-CM | POA: Diagnosis present

## 2022-03-11 DIAGNOSIS — E1165 Type 2 diabetes mellitus with hyperglycemia: Secondary | ICD-10-CM | POA: Diagnosis not present

## 2022-03-11 DIAGNOSIS — R0902 Hypoxemia: Secondary | ICD-10-CM

## 2022-03-11 LAB — CBC WITH DIFFERENTIAL/PLATELET
Abs Immature Granulocytes: 0.05 10*3/uL (ref 0.00–0.07)
Abs Immature Granulocytes: 0.06 10*3/uL (ref 0.00–0.07)
Basophils Absolute: 0 10*3/uL (ref 0.0–0.1)
Basophils Absolute: 0 10*3/uL (ref 0.0–0.1)
Basophils Relative: 0 %
Basophils Relative: 0 %
Eosinophils Absolute: 0.3 10*3/uL (ref 0.0–0.5)
Eosinophils Absolute: 0.2 10*3/uL (ref 0.0–0.5)
Eosinophils Relative: 3 %
Eosinophils Relative: 2 %
HCT: 36.6 % (ref 36.0–46.0)
HCT: 34.7 % — ABNORMAL LOW (ref 36.0–46.0)
Hemoglobin: 11.6 g/dL — ABNORMAL LOW (ref 12.0–15.0)
Hemoglobin: 10.6 g/dL — ABNORMAL LOW (ref 12.0–15.0)
Immature Granulocytes: 1 %
Immature Granulocytes: 1 %
Lymphocytes Relative: 17 %
Lymphocytes Relative: 13 %
Lymphs Abs: 1.8 10*3/uL (ref 0.7–4.0)
Lymphs Abs: 1.3 10*3/uL (ref 0.7–4.0)
MCH: 26.8 pg (ref 26.0–34.0)
MCH: 25.9 pg — ABNORMAL LOW (ref 26.0–34.0)
MCHC: 31.7 g/dL (ref 30.0–36.0)
MCHC: 30.5 g/dL (ref 30.0–36.0)
MCV: 84.5 fL (ref 80.0–100.0)
MCV: 84.8 fL (ref 80.0–100.0)
Monocytes Absolute: 0.7 10*3/uL (ref 0.1–1.0)
Monocytes Absolute: 0.6 10*3/uL (ref 0.1–1.0)
Monocytes Relative: 7 %
Monocytes Relative: 7 %
Neutro Abs: 7.8 10*3/uL — ABNORMAL HIGH (ref 1.7–7.7)
Neutro Abs: 7.2 10*3/uL (ref 1.7–7.7)
Neutrophils Relative %: 72 %
Neutrophils Relative %: 77 %
Platelets: 306 10*3/uL (ref 150–400)
Platelets: 297 10*3/uL (ref 150–400)
RBC: 4.33 MIL/uL (ref 3.87–5.11)
RBC: 4.09 MIL/uL (ref 3.87–5.11)
RDW: 15.8 % — ABNORMAL HIGH (ref 11.5–15.5)
RDW: 15.9 % — ABNORMAL HIGH (ref 11.5–15.5)
WBC: 10.7 10*3/uL — ABNORMAL HIGH (ref 4.0–10.5)
WBC: 9.4 10*3/uL (ref 4.0–10.5)
nRBC: 0.6 % — ABNORMAL HIGH (ref 0.0–0.2)
nRBC: 0.4 % — ABNORMAL HIGH (ref 0.0–0.2)

## 2022-03-11 LAB — BLOOD GAS, VENOUS
Acid-Base Excess: 1.2 mmol/L (ref 0.0–2.0)
Bicarbonate: 27.6 mmol/L (ref 20.0–28.0)
Drawn by: 64699
O2 Saturation: 65.6 %
Patient temperature: 37
pCO2, Ven: 50 mmHg (ref 44–60)
pH, Ven: 7.35 (ref 7.25–7.43)
pO2, Ven: 39 mmHg (ref 32–45)

## 2022-03-11 LAB — COMPREHENSIVE METABOLIC PANEL
ALT: 21 U/L (ref 0–44)
AST: 17 U/L (ref 15–41)
Albumin: 3.8 g/dL (ref 3.5–5.0)
Alkaline Phosphatase: 68 U/L (ref 38–126)
Anion gap: 14 (ref 5–15)
BUN: 18 mg/dL (ref 8–23)
CO2: 20 mmol/L — ABNORMAL LOW (ref 22–32)
Calcium: 10 mg/dL (ref 8.9–10.3)
Chloride: 103 mmol/L (ref 98–111)
Creatinine, Ser: 1.06 mg/dL — ABNORMAL HIGH (ref 0.44–1.00)
GFR, Estimated: 59 mL/min — ABNORMAL LOW (ref >60)
Glucose, Bld: 218 mg/dL — ABNORMAL HIGH (ref 70–99)
Potassium: 3.5 mmol/L (ref 3.5–5.1)
Sodium: 137 mmol/L (ref 135–145)
Total Bilirubin: 1.2 mg/dL (ref 0.3–1.2)
Total Protein: 7.1 g/dL (ref 6.5–8.1)

## 2022-03-11 LAB — URINALYSIS, ROUTINE W REFLEX MICROSCOPIC
Bilirubin Urine: NEGATIVE
Glucose, UA: NEGATIVE mg/dL
Hgb urine dipstick: NEGATIVE
Ketones, ur: NEGATIVE mg/dL
Leukocytes,Ua: NEGATIVE
Nitrite: NEGATIVE
Protein, ur: NEGATIVE mg/dL
Specific Gravity, Urine: 1.006 (ref 1.005–1.030)
pH: 5 (ref 5.0–8.0)

## 2022-03-11 LAB — I-STAT CHEM 8, ED
BUN: 29 mg/dL — ABNORMAL HIGH (ref 8–23)
Calcium, Ion: 1.18 mmol/L (ref 1.15–1.40)
Chloride: 97 mmol/L — ABNORMAL LOW (ref 98–111)
Creatinine, Ser: 1.7 mg/dL — ABNORMAL HIGH (ref 0.44–1.00)
Glucose, Bld: 254 mg/dL — ABNORMAL HIGH (ref 70–99)
HCT: 34 % — ABNORMAL LOW (ref 36.0–46.0)
Hemoglobin: 11.6 g/dL — ABNORMAL LOW (ref 12.0–15.0)
Potassium: 4.2 mmol/L (ref 3.5–5.1)
Sodium: 133 mmol/L — ABNORMAL LOW (ref 135–145)
TCO2: 25 mmol/L (ref 22–32)

## 2022-03-11 LAB — BASIC METABOLIC PANEL
Anion gap: 12 (ref 5–15)
BUN: 30 mg/dL — ABNORMAL HIGH (ref 8–23)
CO2: 24 mmol/L (ref 22–32)
Calcium: 9.4 mg/dL (ref 8.9–10.3)
Chloride: 98 mmol/L (ref 98–111)
Creatinine, Ser: 1.68 mg/dL — ABNORMAL HIGH (ref 0.44–1.00)
GFR, Estimated: 34 mL/min — ABNORMAL LOW (ref >60)
Glucose, Bld: 260 mg/dL — ABNORMAL HIGH (ref 70–99)
Potassium: 4.2 mmol/L (ref 3.5–5.1)
Sodium: 134 mmol/L — ABNORMAL LOW (ref 135–145)

## 2022-03-11 LAB — TROPONIN I (HIGH SENSITIVITY)
Troponin I (High Sensitivity): 17 ng/L (ref <18)
Troponin I (High Sensitivity): 23 ng/L — ABNORMAL HIGH (ref <18)
Troponin I (High Sensitivity): 27 ng/L — ABNORMAL HIGH (ref <18)

## 2022-03-11 LAB — LIPASE, BLOOD: Lipase: 33 U/L (ref 11–51)

## 2022-03-11 MED ORDER — METHYLPREDNISOLONE SODIUM SUCC 125 MG IJ SOLR
125.0000 mg | Freq: Once | INTRAMUSCULAR | Status: AC
  Administered 2022-03-11: 125 mg via INTRAVENOUS
  Filled 2022-03-11: qty 2

## 2022-03-11 MED ORDER — IPRATROPIUM-ALBUTEROL 0.5-2.5 (3) MG/3ML IN SOLN
3.0000 mL | Freq: Four times a day (QID) | RESPIRATORY_TRACT | Status: DC
  Administered 2022-03-12 (×4): 3 mL via RESPIRATORY_TRACT
  Filled 2022-03-11 (×4): qty 3

## 2022-03-11 MED ORDER — DICYCLOMINE HCL 20 MG PO TABS: 20.0000 mg | ORAL_TABLET | Freq: Two times a day (BID) | ORAL | 0 refills | Status: DC

## 2022-03-11 MED ORDER — LACTATED RINGERS IV BOLUS
1000.0000 mL | Freq: Once | INTRAVENOUS | Status: AC
  Administered 2022-03-11: 1000 mL via INTRAVENOUS

## 2022-03-11 MED ORDER — INSULIN ASPART 100 UNIT/ML IJ SOLN
0.0000 [IU] | Freq: Three times a day (TID) | INTRAMUSCULAR | Status: DC
  Administered 2022-03-12 (×2): 15 [IU] via SUBCUTANEOUS
  Administered 2022-03-12 – 2022-03-13 (×3): 8 [IU] via SUBCUTANEOUS
  Administered 2022-03-13: 3 [IU] via SUBCUTANEOUS
  Administered 2022-03-14: 11 [IU] via SUBCUTANEOUS
  Administered 2022-03-14 (×2): 5 [IU] via SUBCUTANEOUS
  Filled 2022-03-11: qty 0.15

## 2022-03-11 MED ORDER — INSULIN ASPART 100 UNIT/ML IJ SOLN
0.0000 [IU] | Freq: Every day | INTRAMUSCULAR | Status: DC
  Administered 2022-03-12: 2 [IU] via SUBCUTANEOUS
  Administered 2022-03-13 – 2022-03-14 (×2): 3 [IU] via SUBCUTANEOUS
  Filled 2022-03-11: qty 0.05

## 2022-03-11 MED ORDER — ALBUTEROL SULFATE (2.5 MG/3ML) 0.083% IN NEBU
5.0000 mg | INHALATION_SOLUTION | Freq: Once | RESPIRATORY_TRACT | Status: AC
  Administered 2022-03-11: 5 mg via RESPIRATORY_TRACT
  Filled 2022-03-11: qty 6

## 2022-03-11 MED ORDER — ACETAMINOPHEN 500 MG PO TABS: 1000.0000 mg | ORAL_TABLET | Freq: Once | ORAL | Status: DC

## 2022-03-11 MED ORDER — METOPROLOL TARTRATE 25 MG PO TABS
12.5000 mg | ORAL_TABLET | Freq: Two times a day (BID) | ORAL | Status: DC
  Administered 2022-03-12 – 2022-03-13 (×5): 12.5 mg via ORAL
  Filled 2022-03-11 (×5): qty 1

## 2022-03-11 MED ORDER — ONDANSETRON 4 MG PO TBDP: 8.0000 mg | ORAL_TABLET | Freq: Once | ORAL | Status: DC

## 2022-03-11 MED ORDER — IOHEXOL 300 MG/ML  SOLN
90.0000 mL | Freq: Once | INTRAMUSCULAR | Status: AC | PRN
  Administered 2022-03-11: 90 mL via INTRAVENOUS

## 2022-03-11 MED ORDER — PANTOPRAZOLE SODIUM 40 MG PO TBEC
40.0000 mg | DELAYED_RELEASE_TABLET | Freq: Every day | ORAL | Status: DC
  Administered 2022-03-12 – 2022-03-15 (×5): 40 mg via ORAL
  Filled 2022-03-11 (×5): qty 1

## 2022-03-11 MED ORDER — APIXABAN 5 MG PO TABS
5.0000 mg | ORAL_TABLET | Freq: Two times a day (BID) | ORAL | Status: DC
  Administered 2022-03-12 – 2022-03-15 (×8): 5 mg via ORAL
  Filled 2022-03-11 (×8): qty 1

## 2022-03-11 MED ORDER — METHYLPREDNISOLONE SODIUM SUCC 40 MG IJ SOLR: 40.0000 mg | Freq: Two times a day (BID) | INTRAMUSCULAR | Status: DC

## 2022-03-11 MED ORDER — ONDANSETRON HCL 4 MG/2ML IJ SOLN
4.0000 mg | Freq: Once | INTRAMUSCULAR | Status: AC
  Administered 2022-03-11: 4 mg via INTRAVENOUS
  Filled 2022-03-11: qty 2

## 2022-03-11 MED ORDER — ATORVASTATIN CALCIUM 20 MG PO TABS
20.0000 mg | ORAL_TABLET | Freq: Every day | ORAL | Status: DC
  Administered 2022-03-12 – 2022-03-15 (×4): 20 mg via ORAL
  Filled 2022-03-11: qty 2
  Filled 2022-03-11 (×3): qty 1

## 2022-03-11 MED ORDER — MORPHINE SULFATE (PF) 4 MG/ML IV SOLN
4.0000 mg | Freq: Once | INTRAVENOUS | Status: AC
  Administered 2022-03-11: 4 mg via INTRAVENOUS
  Filled 2022-03-11: qty 1

## 2022-03-11 MED ORDER — FUROSEMIDE 10 MG/ML IJ SOLN
40.0000 mg | Freq: Once | INTRAMUSCULAR | Status: AC
  Administered 2022-03-11: 40 mg via INTRAVENOUS
  Filled 2022-03-11: qty 4

## 2022-03-11 MED ORDER — FOLIC ACID 1 MG PO TABS
1.0000 mg | ORAL_TABLET | Freq: Every day | ORAL | Status: DC
  Administered 2022-03-12 – 2022-03-15 (×4): 1 mg via ORAL
  Filled 2022-03-11 (×4): qty 1

## 2022-03-11 MED ORDER — SODIUM CHLORIDE 0.9 % IV BOLUS: 1000.0000 mL | Freq: Once | INTRAVENOUS | Status: DC

## 2022-03-11 NOTE — H&P (Addendum)
?History and Physical ? ?Kathleen Kane OMV:[id number] DOB: Oct 20, 1961 DOA: 03/11/2022 ? ?Referring physician: Dr. Darl Householder, [place]. ?PCP: [name], MD  ?Outpatient Specialists: Cardiology, rheumatology. ?Patient coming from: Home. ? ?Chief Complaint: Shortness of breath. ? ?HPI: Kathleen Kane is a 61 y.o. female with medical history significant for pulmonary fibrosis seen on CT scan (03/09/22), rheumatoid arthritis (stopped seeing her rheumatologist and stopped taking methotrexate and home prednisone), essential hypertension, type 2 diabetes, morbid obesity, permanent atrial fibrillation on Eliquis.  She presented to [facility] ED with complaints of gradually worsening shortness of breath, after she was discharged from [facility] ED the same day.  She took twice the dose of her home p.o. Lasix with minimal improvement of her symptomatology.  EMS was activated.  Upon EMS arrival, she was noted to have oxygen saturation 80% on room air.  Not associated with chest pain. Her hemoptysis has resolved.  Endorses a cough with light brown mucus.  No fevers or chills. ? ?To note, the patient presented earlier today at [facility] ED with complaints of right lower abdominal pain, nausea, and vomiting which have now resolved.  She was also seen 2 days ago on 03/09/22 at [facility] ED for hemoptysis, at that time CTA chest ruled out pulmonary embolism, however showed extensive pulmonary fibrosis, no intrathoracic mass.  No prior pulmonary history.  Her home Eliquis was held and she was discharged to home.  ? ?On 03/07/2022 she presented to [facility] urgent care for cough sore throat and generalized body aches, at that time she was diagnosed with a viral infection was prescribed prednisone taper and sent home.  On 01/11/2022 she went to [facility] urgent care with cough and generalized joint pain.  It was thought that her symptomatology was secondary to a viral upper respiratory infection with cough.  She was prescribed Medrol taper and sent home and advised to follow-up with her  rheumatologist.   ? ?In the ED at [facility], patient endorses that she stopped taking her home prednisone 5 days ago and no longer has a rheumatologist.  O2 saturation is improved, 95% on 2 L.  Persistent tachycardia with heart rate in the 110s.  Due to concern for new hypoxia, EDP requested admission.  The patient was admitted by [facility], hospitalist service. ? ? ?ED Course: Tmax 99.8.  BP 102/67, pulse of 103, respiratory rate 32, oxygen saturation 95% on 2 L.  Serum sodium 133, glucose 254, BUN 29, creatinine 1.70, from 1.06 earlier this morning.  GFR 34.  WBC 10.7, hemoglobin 11.6, MCV 84.  UA negative for pyuria. ? ? ?Review of Systems: ?Review of systems as noted in the HPI. All other systems reviewed and are negative. ? ? ?Past Medical History:  ?Diagnosis Date  ? Acute on chronic diastolic CHF (congestive heart failure) ([place]) 08/08/2015  ? Anxiety   ? Atrial fibrillation, persistent ([place])   ? Carpal tunnel syndrome, right 12/15/2016  ? CHF (congestive heart failure) ([place]) 2013  ? No echo found from that time, most likely diastolic  ? CHF exacerbation ([place]) 10/01/2018  ? Depression   ? Diabetes mellitus ([place]) 08/10/2012  ? Diabetes mellitus without complication ([place])   ? Hypertension   ? 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 Morbid obesity ([place]) 05/06/2015  ? Obesity   ? Obstructive sleep apnea   ? Sleep study performed 10/18/2008. AHI-6.8/hr, during REM-27.3/hr. RDI-25.0/hr, during REM-40.9/hr. avg o2 sat during REM and NREM 95%  ? OSA (obstructive sleep apnea) 02/10/2017  ?  Pelvic mass in female 01/29/2013  ? With ascites   ? Pneumonia 10/02/2018  ? Rheumatoid arthritis ([place])   ? 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 ?Past Surgical History:  ?Procedure Laterality Date  ? CARDIOVASCULAR STRESS TEST  02/16/2013  ? no significant EKG changes with Lexiscan, normal LV function and normal wall function  ? CESAREAN SECTION    ? x2  ? DOPPLER ECHOCARDIOGRAPHY  01/24/2008  ? EF  55-60%, no diagnostic evidence of LV wall motion abnormalities, LV wall thickness was mild-moderately increased.  ? HAND SURGERY Right 2018  ? LAPAROTOMY Right 02/27/2013  ? Procedure: EXPLORATORY LAPAROTOMY, right salpingo-oopherectomy;  Surgeon: [name], MD;  Location: [facility] ORS;  Service: Gynecology;  Laterality: Right;  ? left breast cyst    ? SALPINGOOPHORECTOMY Right 02/27/2013  ? Procedure: SALPINGO OOPHORECTOMY;  Surgeon: [name], MD;  Location: [facility] ORS;  Service: Gynecology;  Laterality: Right;  ? TUBAL LIGATION Bilateral 1989  ? ? ?Social History:  reports that she quit smoking about 7 years ago. Her smoking use included cigarettes. She has a 18.00 pack-year smoking history. She has never been exposed to tobacco smoke. She has never used smokeless tobacco. She reports that she does not drink alcohol and does not use drugs. ? ? ?Allergies  ?Allergen Reactions  ? Ace Inhibitors Cough  ? Lisinopril Cough  ? ? ?Family History  ?Problem Relation Age of Onset  ? Hypertension Mother   ? Diabetes Mother   ? Throat cancer Mother   ? Hypertension Father   ? Diabetes Paternal Grandmother   ? Diabetes Sister   ? Heart Problems Sister   ? Kidney failure Sister   ? Hypertension Brother   ? Diabetes Brother   ? Healthy Son   ? Autoimmune disease Daughter   ?  ? ? ?Prior to Admission medications   ?Medication Sig Start Date End Date Taking? Authorizing Provider  ?Accu-Chek FastClix Lancets MISC Apply topically. 08/04/20   [provider]  ?ACCU-CHEK GUIDE test strip  08/14/20   [provider]  ?allopurinol (ZYLOPRIM) 100 MG tablet Take 100 mg by mouth daily. ?Patient not taking: Reported on 03/11/2022 03/10/21   [provider]  ?apixaban (ELIQUIS) 5 MG TABS tablet TAKE 1 TABLET BY MOUTH TWICE A DAY 01/11/22   Josue Hector, MD  ?atorvastatin (LIPITOR) 20 MG tablet TAKE 1 TABLET (20 MG TOTAL) BY MOUTH DAILY. PLEASE SCHEDULE YEARLY APPOINTMENT FOR FUTURE REFILLS. 1ST ATTEMPT. THANK YOU  01/29/22   Josue Hector, MD  ?B-D ULTRAFINE III SHORT PEN 31G X 8 MM MISC Inject into the skin 2 (two) times daily. 06/30/20   [provider]  ?benzonatate (TESSALON) 100 MG capsule Take 1 capsule (100 mg total) by mouth 3 (three) times daily as needed for cough. ?Patient not taking: Reported on 01/20/2022 01/11/22   Barrett Henle, MD  ?blood glucose meter kit and supplies Dispense based on patient and insurance preference. Use up to four times daily as directed. (FOR ICD-10 E10.9, E11.9). 10/03/18   Aline August, MD  ?cephALEXin (KEFLEX) 500 MG capsule Take 1 capsule (500 mg total) by mouth 3 (three) times daily. ?Patient not taking: Reported on 01/20/2022 10/21/21   Jaynee Eagles, PA-C  ?clobetasol cream (TEMOVATE) 5.05 % Apply 1 application topically 2 (two) times daily. ?Patient not taking: Reported on 03/11/2022 07/22/21   Ofilia Neas, PA-C  ?diclofenac sodium (VOLTAREN) 1 % GEL Apply 2 g topically 4 (four) times daily. ?Patient not taking:  Reported on 03/11/2022 04/14/18   Jeannett Senior, PA-C  ?dicyclomine (BENTYL) 20 MG tablet Take 1 tablet (20 mg total) by mouth 2 (two) times daily. 03/11/22   Kommor, [place], MD  ?fluconazole (DIFLUCAN) 150 MG tablet Take 1 tablet (150 mg total) by mouth once a week. ?Patient not taking: Reported on 01/20/2022 10/21/21   Jaynee Eagles, PA-C  ?folic acid (FOLVITE) 1 MG tablet Take 1 mg by mouth daily. 08/19/17   [provider]  ?furosemide (LASIX) 40 MG tablet TAKE 1 TABLET BY MOUTH TWICE A DAY ?Patient taking differently: Take 40 mg by mouth 2 (two) times daily as needed for fluid. 10/29/19   Josue Hector, MD  ?gabapentin (NEURONTIN) 300 MG capsule Take 300 mg by mouth 3 (three) times daily. ?Patient not taking: Reported on 03/11/2022    [provider]  ?hydrOXYzine (ATARAX) 25 MG tablet Take 0.5-1 tablets (12.5-25 mg total) by mouth every 8 (eight) hours as needed for itching. ?Patient not taking: Reported on 03/11/2022 10/21/21   Jaynee Eagles,  PA-C  ?ibuprofen (ADVIL) 800 MG tablet Take 800 mg by mouth as needed.    [provider]  ?losartan (COZAAR) 25 MG tablet TAKE 1 TABLET BY MOUTH EVERY DAY ?Patient taking differently: Take 25 mg by

## 2022-03-11 NOTE — Discharge Instructions (Addendum)
Please take 1000 mg of Tylenol 3 times daily over the next 5 days for your abdominal pain.  There is also a prescription for Bentyl that will be sent to your pharmacy for abdominal pain.  Please take this twice daily.  Please do not miss your PCP follow-up in 4 days and please have a discussion about pulmonary fibrosis and outpatient pulmonology follow-up. ?

## 2022-03-11 NOTE — ED Triage Notes (Signed)
Pt c/o abd pain x 1 week, worsening yesterday with n/v/d ?

## 2022-03-11 NOTE — ED Provider Notes (Signed)
?[facility] DEPT ?Provider Note ? ? ?CSN: [id number] ?Arrival date & time: 03/11/22  2141 ? ?  ? ?History ? ?Chief Complaint  ?Patient presents with  ? Shortness of Breath  ?  [facility] and [facility] with a  DX of pulmonary fibrosis. She became increaseingly SOB at home and called EMS to bring her to the ED. Sats in the 80's on room air when EMS arrived.  ? ? ?Kathleen Kane is a 61 y.o. female hx of afib on eliquis, CHF, pulmonary fibrosis, here with SOB.  Patient states that she  was seen multiple times recently.  She went to urgent care 5 days ago was diagnosed with a URI with cough and sent home with a course of steroids.  She then started having hemoptysis 2 days ago and had a CT chest that showed pulmonary fibrosis and cardiomegaly.  Her BNP at that time was 400.  Patient then went to the ED yesterday for abdominal pain and a CT abdomen pelvis that was unremarkable.  She was told to stop her Eliquis for several days.  She states that she went home and felt worsening shortness of breath.  Her oxygen at home was 84%.  Patient denies any fevers or chills or cough.  ? ?The history is provided by the patient.  ? ?  ? ?Home Medications ?Prior to Admission medications   ?Medication Sig Start Date End Date Taking? Authorizing Provider  ?Accu-Chek FastClix Lancets MISC Apply topically. 08/04/20   [provider]  ?ACCU-CHEK GUIDE test strip  08/14/20   [provider]  ?allopurinol (ZYLOPRIM) 100 MG tablet Take 100 mg by mouth daily. ?Patient not taking: Reported on 03/11/2022 03/10/21   [provider]  ?apixaban (ELIQUIS) 5 MG TABS tablet TAKE 1 TABLET BY MOUTH TWICE A DAY 01/11/22   Josue Hector, MD  ?atorvastatin (LIPITOR) 20 MG tablet TAKE 1 TABLET (20 MG TOTAL) BY MOUTH DAILY. PLEASE SCHEDULE YEARLY APPOINTMENT FOR FUTURE REFILLS. 1ST ATTEMPT. THANK YOU 01/29/22   Josue Hector, MD  ?B-D ULTRAFINE III SHORT PEN 31G X 8 MM MISC Inject into the  skin 2 (two) times daily. 06/30/20   [provider]  ?benzonatate (TESSALON) 100 MG capsule Take 1 capsule (100 mg total) by mouth 3 (three) times daily as needed for cough. ?Patient not taking: Reported on 01/20/2022 01/11/22   Barrett Henle, MD  ?blood glucose meter kit and supplies Dispense based on patient and insurance preference. Use up to four times daily as directed. (FOR ICD-10 E10.9, E11.9). 10/03/18   Aline August, MD  ?cephALEXin (KEFLEX) 500 MG capsule Take 1 capsule (500 mg total) by mouth 3 (three) times daily. ?Patient not taking: Reported on 01/20/2022 10/21/21   Jaynee Eagles, PA-C  ?clobetasol cream (TEMOVATE) 6.38 % Apply 1 application topically 2 (two) times daily. ?Patient not taking: Reported on 03/11/2022 07/22/21   Ofilia Neas, PA-C  ?diclofenac sodium (VOLTAREN) 1 % GEL Apply 2 g topically 4 (four) times daily. ?Patient not taking: Reported on 03/11/2022 04/14/18   Jeannett Senior, PA-C  ?dicyclomine (BENTYL) 20 MG tablet Take 1 tablet (20 mg total) by mouth 2 (two) times daily. 03/11/22   Kommor, [place], MD  ?fluconazole (DIFLUCAN) 150 MG tablet Take 1 tablet (150 mg total) by mouth once a week. ?Patient not taking: Reported on 01/20/2022 10/21/21   Jaynee Eagles, PA-C  ?folic acid (FOLVITE) 1 MG tablet Take 1 mg by mouth daily.  08/19/17   [provider]  ?furosemide (LASIX) 40 MG tablet TAKE 1 TABLET BY MOUTH TWICE A DAY ?Patient taking differently: Take 40 mg by mouth 2 (two) times daily as needed for fluid. 10/29/19   Josue Hector, MD  ?gabapentin (NEURONTIN) 300 MG capsule Take 300 mg by mouth 3 (three) times daily. ?Patient not taking: Reported on 03/11/2022    [provider]  ?hydrOXYzine (ATARAX) 25 MG tablet Take 0.5-1 tablets (12.5-25 mg total) by mouth every 8 (eight) hours as needed for itching. ?Patient not taking: Reported on 03/11/2022 10/21/21   Jaynee Eagles, PA-C  ?ibuprofen (ADVIL) 800 MG tablet Take 800 mg by mouth as needed.    [provider]  ?losartan (COZAAR) 25 MG tablet TAKE 1 TABLET BY MOUTH EVERY DAY ?Patient taking differently: Take 25 mg by mouth daily. 10/29/19   Josue Hector, MD  ?metFORMIN (GLUCOPHAGE) 500 MG tablet Take 2 tablets (1,000 mg total) by mouth 2 (two) times daily. 10/03/18   Aline August, MD  ?Methotrexate, PF, (RASUVO) 17.5 MG/0.35ML SOAJ Inject  1 pen (17.5 mg) into the skin once a week. ?Patient not taking: Reported on 01/20/2022 07/24/21   Ofilia Neas, PA-C  ?metoprolol tartrate (LOPRESSOR) 25 MG tablet Take 0.5 tablets (12.5 mg total) by mouth 2 (two) times daily. 02/07/19   Josue Hector, MD  ?NOVOLOG MIX 70/30 FLEXPEN (70-30) 100 UNIT/ML FlexPen Inject 0.4 mLs (40 Units total) into the skin 2 (two) times daily with a meal. 10/03/18   Aline August, MD  ?Maureen Chatters CLICKJECT 132 MG/ML SOAJ INJECT THE CONTENTS OF 1 PEN INTO THE SKIN EVERY 7 DAYS. ?Patient not taking: Reported on 01/20/2022 09/16/21   Ofilia Neas, PA-C  ?polyethylene glycol-electrolytes (NULYTELY) 420 g solution See admin instructions. ?Patient not taking: Reported on 07/22/2021 07/07/21   [provider]  ?predniSONE (DELTASONE) 5 MG tablet Take 2 tablets (10 mg total) by mouth daily with breakfast. 02/15/22   Bo Merino, MD  ?predniSONE (STERAPRED UNI-PAK 21 TAB) 10 MG (21) TBPK tablet Take by mouth daily. Take 6 tabs by mouth daily  for 2 days, then 5 tabs for 2 days, then 4 tabs for 2 days, then 3 tabs for 2 days, 2 tabs for 2 days, then 1 tab by mouth daily for 2 days ?Patient not taking: Reported on 03/11/2022 03/07/22   Hans Eden, NP  ?tiZANidine (ZANAFLEX) 4 MG tablet Take 4 mg by mouth 2 (two) times daily as needed for muscle spasms. ?Patient not taking: Reported on 03/11/2022 03/09/21   [provider]  ?triamcinolone cream (KENALOG) 0.5 % Apply topically 2 (two) times daily as needed. ?Patient not taking: Reported on 03/11/2022 06/24/21   [provider]  ?   ? ?Allergies    ?Ace inhibitors and  Lisinopril   ? ?Review of Systems   ?Review of Systems  ?Respiratory:  Positive for shortness of breath.   ?All other systems reviewed and are negative. ? ?Physical Exam ?Updated Vital Signs ?BP 137/73 (BP Location: Right Arm)   Pulse (!) 109   Temp 98.1 ?F (36.7 ?C) (Oral)   Resp 20   LMP 10/28/2013   SpO2 (!) 84%  ?Physical Exam ?Vitals and nursing note reviewed.  ?Constitutional:   ?   Comments: Tachypneic, chronically ill   ?HENT:  ?   Head: Normocephalic.  ?Eyes:  ?   Pupils: Pupils are equal, round, and reactive to light.  ?Cardiovascular:  ?   Rate  and Rhythm: Normal rate and regular rhythm.  ?Pulmonary:  ?   Comments: Dry crackles bilaterally. ?Musculoskeletal:     ?   General: Normal range of motion.  ?   Cervical back: Normal range of motion and neck supple.  ?Skin: ?   General: Skin is warm.  ?   Capillary Refill: Capillary refill takes less than 2 seconds.  ?Neurological:  ?   General: No focal deficit present.  ?   Mental Status: She is alert and oriented to person, place, and time.  ?Psychiatric:     ?   Mood and Affect: Mood normal.     ?   Behavior: Behavior normal.  ? ? ?ED Results / Procedures / Treatments   ?Labs ?(all labs ordered are listed, but only abnormal results are displayed) ?Labs Reviewed  ?CBC WITH DIFFERENTIAL/PLATELET  ?BASIC METABOLIC PANEL  ?BLOOD GAS, VENOUS  ?BRAIN NATRIURETIC PEPTIDE  ?I-STAT CHEM 8, ED  ?TROPONIN I (HIGH SENSITIVITY)  ? ? ?EKG ?EKG Interpretation ? ?Date/Time:  Thursday Mar 11 2022 22:40:21 EDT ?Ventricular Rate:  89 ?PR Interval:    ?QRS Duration: 92 ?QT Interval:  396 ?QTC Calculation: 482 ?R Axis:   18 ?Text Interpretation: Atrial fibrillation Nonspecific T abnormalities, lateral leads No significant change since last tracing Confirmed by Wandra Arthurs [phone]) on 03/11/2022 10:44:22 PM ? ?Radiology ?CT ABDOMEN PELVIS W CONTRAST ? ?Result Date: 03/11/2022 ?CLINICAL DATA:  Right lower quadrant pain. EXAM: CT ABDOMEN AND PELVIS WITH CONTRAST TECHNIQUE:  Multidetector CT imaging of the abdomen and pelvis was performed using the standard protocol following bolus administration of intravenous contrast. RADIATION DOSE REDUCTION: This exam was performed according to the departmental d

## 2022-03-11 NOTE — ED Provider Triage Note (Addendum)
Emergency Medicine Provider Triage Evaluation Note ? ?Kathleen Kane , a 61 y.o. female  was evaluated in triage.  Pt complains of Abdominal pain.  Has history of atrial fibrillation, was seen yesterday due to concern for possible pulmonary hemorrhage and chest pain.  States she was having abdominal pain at that time.  After work-up she was discharged home with instructions to hold Eliquis, has not had anticoagulation today.  States the abdominal pain is diffuse all over her abdomen and associated with nausea and one episode of emesis.  Also having some diarrhea.  States it moves up to her chest and feels like chest pain as well.  Earlier today she had a witnessed syncopal episode that lasted less than a minute and she did not hit her head.  States she has been taking Mylanta with no improvement of her abdominal pain. ? ?Review of Systems  ?Per HPI ? ?Physical Exam  ?BP (!) 145/86 (BP Location: Right Arm)   Pulse (!) 119   Temp 99.8 ?F (37.7 ?C) (Oral)   Resp 20   LMP 10/28/2013   SpO2 90%  ?Gen:   Awake, no distress   ?Resp:  Normal effort  ?MSK:   Moves extremities without difficulty  ?Other:  Diffuse abdominal tenderness,.  Abdomen is soft.  Patient is tachycardic with a regular rhythm ? ?Medical Decision Making  ?Medically screening exam initiated at 12:38 AM.  Appropriate orders placed.  Kathleen Kane was informed that the remainder of the evaluation will be completed by another provider, this initial triage assessment does not replace that evaluation, and the importance of remaining in the ED until their evaluation is complete. ? ?CT chest with contrast obtained yesterday, notable for pulmonary fibrosis but no active hemorrhaging.  EKG obtained in triage shows atrial fibrillation with rate of 119.  We will start with labs and given patient having persistent chest pain troponin up pain. ? ?Patient is tachycardic with an elevated temperature 99.8 but is not technically meet SIRS criteria at this time. ?   ?Sherrill Raring, PA-C ?03/11/22 8916 ? ?  ?Sherrill Raring, PA-C ?03/11/22 9450 ? ?

## 2022-03-11 NOTE — ED Provider Notes (Signed)
?[facility] ?Provider Note ? ?CSN: [id number] ?Arrival date & time: 03/10/22 2314 ? ?Chief Complaint(s) ?Abdominal Pain ? ?HPI ?Kathleen Kane is a 61 y.o. female with PMH CHF and EF 55 to 60%, T2DM, persistent A-fib on Eliquis, rheumatoid arthritis on methotrexate who presents emergency department for evaluation of abdominal pain nausea and vomiting.  Patient was seen 2 days ago with hemoptysis and received a CT chest that showed extensive pulmonary fibrosis but no intrathoracic mass and patient was ultimately discharged home.  Patient states that over the last week she has had gradually worsening right lower quadrant pain and had such severe pain that she had an episode of syncope in the waiting room.  She states that her last bowel movement was yesterday but denies hematochezia, hematemesis, headache, fever, dysuria, increased frequency or other systemic symptoms. ? ? ?Past Medical History ?Past Medical History:  ?Diagnosis Date  ? Acute on chronic diastolic CHF (congestive heart failure) ([place]) 08/08/2015  ? Anxiety   ? Atrial fibrillation, persistent ([place])   ? Carpal tunnel syndrome, right 12/15/2016  ? CHF (congestive heart failure) ([place]) 2013  ? No echo found from that time, most likely diastolic  ? CHF exacerbation ([place]) 10/01/2018  ? Depression   ? Diabetes mellitus ([place]) 08/10/2012  ? Diabetes mellitus without complication ([place])   ? Hypertension   ? 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 Morbid obesity ([place]) 05/06/2015  ? Obesity   ? Obstructive sleep apnea   ? Sleep study performed 10/18/2008. AHI-6.8/hr, during REM-27.3/hr. RDI-25.0/hr, during REM-40.9/hr. avg o2 sat during REM and NREM 95%  ? OSA (obstructive sleep apnea) 02/10/2017  ? Pelvic mass in female 01/29/2013  ? With ascites   ? Pneumonia 10/02/2018  ? Rheumatoid arthritis ([place])   ? Rheumatoid arthritis, 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[place]) 10/08/2015  ? ?Patient Active Problem List  ? Diagnosis  Date Noted  ? Colon cancer screening 01/26/2022  ? 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 Dysphagia 01/26/2022  ? Personal history of colonic polyps 01/26/2022  ? Aortic atherosclerosis ([place]) 05/13/2021  ? Acute otalgia, right 11/26/2020  ? Arthralgia of right temporomandibular joint 11/26/2020  ? Bilateral impacted cerumen 11/26/2020  ? HCAP (healthcare-associated pneumonia) 10/02/2018  ? CHF exacerbation ([place]) 10/01/2018  ? Community acquired pneumonia 10/01/2018  ? OSA (obstructive sleep apnea) 02/10/2017  ? Carpal tunnel syndrome, right 12/15/2016  ? Extensor tenosynovitis of wrist, right 12/15/2016  ? Bilateral primary osteoarthritis of knee 10/08/2015  ? Rheumatoid arthritis, 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[place]) 10/08/2015  ? Acute on chronic diastolic CHF (congestive heart failure) ([place]) 08/08/2015  ? Morbid obesity ([place]) 05/06/2015  ? Essential hypertension 04/10/2015  ? 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 Primary osteoarthritis involving multiple joints 04/09/2015  ? Elevated uric acid in blood 01/23/2014  ? High risk medication use 01/23/2014  ? Numbness and tingling in left hand 10/01/2013  ? Tobacco abuse 10/01/2013  ? Elevated CA-125 02/06/2013  ? Pelvic mass in female 01/29/2013  ? Diabetes mellitus ([place]) 08/10/2012  ? Atrial fibrillation ([place])   ? Chronic diastolic CHF (congestive heart failure) ([place])   ? ?Home Medication(s) ?Prior to Admission medications   ?Medication Sig Start Date End Date Taking? Authorizing Provider  ?Accu-Chek FastClix Lancets MISC Apply topically. 08/04/20   [provider]  ?ACCU-CHEK GUIDE test strip  08/14/20   [provider]  ?allopurinol (ZYLOPRIM) 100 MG tablet Take 100 mg by mouth daily. 03/10/21   [provider]  ?apixaban (ELIQUIS) 5 MG TABS tablet TAKE 1 TABLET BY MOUTH TWICE A DAY 01/11/22   Josue Hector, MD  ?atorvastatin (LIPITOR) 20 MG tablet TAKE 1 TABLET (20 MG TOTAL) BY MOUTH DAILY. PLEASE SCHEDULE YEARLY  APPOINTMENT FOR FUTURE REFILLS. 1ST ATTEMPT. THANK YOU 01/29/22   Josue Hector, MD  ?B-D ULTRAFINE III SHORT PEN 31G X 8 MM MISC Inject into the skin 2 (two) times daily. 06/30/20   [provider]  ?benzonatate (TESSALON) 100 MG capsule Take 1 capsule (100 mg total) by mouth 3 (three) times daily as needed for cough. ?Patient not taking: Reported on 01/20/2022 01/11/22   Barrett Henle, MD  ?blood glucose meter kit and supplies Dispense based on patient and insurance preference. Use up to four times daily as directed. (FOR ICD-10 E10.9, E11.9). 10/03/18   Aline August, MD  ?cephALEXin (KEFLEX) 500 MG capsule Take 1 capsule (500 mg total) by mouth 3 (three) times daily. ?Patient not taking: Reported on 01/20/2022 10/21/21   Jaynee Eagles, PA-C  ?clobetasol cream (TEMOVATE) 7.59 % Apply 1 application topically 2 (two) times daily. 07/22/21   Ofilia Neas, PA-C  ?diclofenac sodium (VOLTAREN) 1 % GEL Apply 2 g topically 4 (four) times daily. 04/14/18   Kirichenko, [place] Rocker, PA-C  ?fluconazole (DIFLUCAN) 150 MG tablet Take 1 tablet (150 mg total) by mouth once a week. ?Patient not taking: Reported on 01/20/2022 10/21/21   Jaynee Eagles, PA-C  ?folic acid (FOLVITE) 1 MG tablet Take 1 mg by mouth daily. 08/19/17   [provider]  ?furosemide (LASIX) 40 MG tablet TAKE 1 TABLET BY MOUTH TWICE A DAY ?Patient taking differently: Take 40 mg by mouth daily. 10/29/19   Josue Hector, MD  ?gabapentin (NEURONTIN) 300 MG capsule Take 300 mg by mouth 3 (three) times daily.    [provider]  ?hydrOXYzine (ATARAX) 25 MG tablet Take 0.5-1 tablets (12.5-25 mg total) by mouth every 8 (eight) hours as needed for itching. 10/21/21   Jaynee Eagles, PA-C  ?ibuprofen (ADVIL) 800 MG tablet Take 800 mg by mouth as needed.    [provider]  ?losartan (COZAAR) 25 MG tablet TAKE 1 TABLET BY MOUTH EVERY DAY 10/29/19   Josue Hector, MD  ?metFORMIN (GLUCOPHAGE) 500 MG tablet Take 2 tablets (1,000 mg total) by  mouth 2 (two) times daily. 10/03/18   Aline August, MD  ?Methotrexate, PF, (RASUVO) 17.5 MG/0.35ML SOAJ Inject  1 pen (17.5 mg) into the skin once a week. ?Patient not taking: Reported on 01/20/2022 07/24/21   Ofilia Neas, PA-C  ?metoprolol tartrate (LOPRESSOR) 25 MG tablet Take 0.5 tablets (12.5 mg total) by mouth 2 (two) times daily. 02/07/19   Josue Hector, MD  ?NOVOLOG MIX 70/30 FLEXPEN (70-30) 100 UNIT/ML FlexPen Inject 0.4 mLs (40 Units total) into the skin 2 (two) times daily with a meal. 10/03/18   Aline August, MD  ?Maureen Chatters CLICKJECT 163 MG/ML SOAJ INJECT THE CONTENTS OF 1 PEN INTO THE SKIN EVERY 7 DAYS. ?Patient not taking: Reported on 01/20/2022 09/16/21   Ofilia Neas, PA-C  ?polyethylene glycol-electrolytes (NULYTELY) 420 g solution See admin instructions. ?Patient not taking: Reported on 07/22/2021 07/07/21   [provider]  ?predniSONE (DELTASONE) 5 MG tablet Take 2 tablets (10 mg total) by mouth daily with breakfast. 02/15/22   Bo Merino, MD  ?predniSONE (STERAPRED UNI-PAK 21 TAB) 10 MG (21) TBPK tablet Take by mouth daily. Take 6 tabs by mouth daily  for 2 days, then 5 tabs for 2 days, then 4 tabs for 2 days, then 3 tabs for 2 days, 2 tabs for 2 days, then 1 tab by mouth daily for 2 days 03/07/22   Hans Eden, NP  ?tiZANidine (ZANAFLEX) 4 MG tablet Take 4 mg by mouth 2 (two) times daily as needed for muscle spasms. 03/09/21   [provider]  ?triamcinolone cream (KENALOG) 0.5 % Apply topically 2 (two) times daily as needed. 06/24/21   [provider]  ?                                                                                                                                  ?Past Surgical History ?Past Surgical History:  ?Procedure Laterality Date  ? CARDIOVASCULAR STRESS TEST  02/16/2013  ? no significant EKG changes with Lexiscan, normal LV function and normal wall function  ? CESAREAN SECTION    ? x2  ? DOPPLER ECHOCARDIOGRAPHY  01/24/2008  ? EF  55-60%, no diagnostic evidence of LV wall motion abnormalities, LV wall thickness was mild-moderately increased.  ? HAND SURGERY Right 2018  ? LAPAROTOMY Right 02/27/2013  ? Procedure: EXPLORATORY LAPAROTOMY, right

## 2022-03-11 NOTE — ED Triage Notes (Signed)
[facility] and discharged with a  DX of pulmonary fibrosis. She became increaseingly SOB at home and called EMS to bring her to the ED. Sats in the 80's on room air when EMS arrived. ?

## 2022-03-12 DIAGNOSIS — I5032 Chronic diastolic (congestive) heart failure: Secondary | ICD-10-CM

## 2022-03-12 DIAGNOSIS — I482 Chronic atrial fibrillation, unspecified: Secondary | ICD-10-CM

## 2022-03-12 DIAGNOSIS — J810 Acute pulmonary edema: Secondary | ICD-10-CM

## 2022-03-12 DIAGNOSIS — I509 Heart failure, unspecified: Secondary | ICD-10-CM

## 2022-03-12 DIAGNOSIS — J8410 Pulmonary fibrosis, unspecified: Secondary | ICD-10-CM

## 2022-03-12 DIAGNOSIS — I10 Essential (primary) hypertension: Secondary | ICD-10-CM

## 2022-03-12 LAB — CBC WITH DIFFERENTIAL/PLATELET
Abs Immature Granulocytes: 0.05 10*3/uL (ref 0.00–0.07)
Basophils Absolute: 0 10*3/uL (ref 0.0–0.1)
Basophils Relative: 0 %
Eosinophils Absolute: 0 10*3/uL (ref 0.0–0.5)
Eosinophils Relative: 0 %
HCT: 33.9 % — ABNORMAL LOW (ref 36.0–46.0)
Hemoglobin: 10.9 g/dL — ABNORMAL LOW (ref 12.0–15.0)
Immature Granulocytes: 1 %
Lymphocytes Relative: 5 %
Lymphs Abs: 0.4 10*3/uL — ABNORMAL LOW (ref 0.7–4.0)
MCH: 27.3 pg (ref 26.0–34.0)
MCHC: 32.2 g/dL (ref 30.0–36.0)
MCV: 85 fL (ref 80.0–100.0)
Monocytes Absolute: 0.1 10*3/uL (ref 0.1–1.0)
Monocytes Relative: 1 %
Neutro Abs: 7.8 10*3/uL — ABNORMAL HIGH (ref 1.7–7.7)
Neutrophils Relative %: 93 %
Platelets: 298 10*3/uL (ref 150–400)
RBC: 3.99 MIL/uL (ref 3.87–5.11)
RDW: 15.9 % — ABNORMAL HIGH (ref 11.5–15.5)
WBC: 8.4 10*3/uL (ref 4.0–10.5)
nRBC: 0.4 % — ABNORMAL HIGH (ref 0.0–0.2)

## 2022-03-12 LAB — HIV ANTIBODY (ROUTINE TESTING W REFLEX): HIV Screen 4th Generation wRfx: NONREACTIVE

## 2022-03-12 LAB — RETICULOCYTES
Immature Retic Fract: 34.3 % — ABNORMAL HIGH (ref 2.3–15.9)
RBC.: 3.88 MIL/uL (ref 3.87–5.11)
Retic Count, Absolute: 102 10*3/uL (ref 19.0–186.0)
Retic Ct Pct: 2.6 % (ref 0.4–3.1)

## 2022-03-12 LAB — COMPREHENSIVE METABOLIC PANEL
ALT: 17 U/L (ref 0–44)
AST: 14 U/L — ABNORMAL LOW (ref 15–41)
Albumin: 3.6 g/dL (ref 3.5–5.0)
Alkaline Phosphatase: 61 U/L (ref 38–126)
Anion gap: 14 (ref 5–15)
BUN: 37 mg/dL — ABNORMAL HIGH (ref 8–23)
CO2: 21 mmol/L — ABNORMAL LOW (ref 22–32)
Calcium: 9.1 mg/dL (ref 8.9–10.3)
Chloride: 98 mmol/L (ref 98–111)
Creatinine, Ser: 1.61 mg/dL — ABNORMAL HIGH (ref 0.44–1.00)
GFR, Estimated: 36 mL/min — ABNORMAL LOW (ref >60)
Glucose, Bld: 286 mg/dL — ABNORMAL HIGH (ref 70–99)
Potassium: 4.7 mmol/L (ref 3.5–5.1)
Sodium: 133 mmol/L — ABNORMAL LOW (ref 135–145)
Total Bilirubin: 1 mg/dL (ref 0.3–1.2)
Total Protein: 7.1 g/dL (ref 6.5–8.1)

## 2022-03-12 LAB — VITAMIN B12: Vitamin B-12: 561 pg/mL (ref 180–914)

## 2022-03-12 LAB — RESP PANEL BY RT-PCR (FLU A&B, COVID) ARPGX2
Influenza A by PCR: NEGATIVE
Influenza B by PCR: NEGATIVE
SARS Coronavirus 2 by RT PCR: NEGATIVE

## 2022-03-12 LAB — IRON AND TIBC
Iron: 33 ug/dL (ref 28–170)
Saturation Ratios: 10 % — ABNORMAL LOW (ref 10.4–31.8)
TIBC: 322 ug/dL (ref 250–450)
UIBC: 289 ug/dL

## 2022-03-12 LAB — CBG MONITORING, ED
Glucose-Capillary: 188 mg/dL — ABNORMAL HIGH (ref 70–99)
Glucose-Capillary: 291 mg/dL — ABNORMAL HIGH (ref 70–99)
Glucose-Capillary: 371 mg/dL — ABNORMAL HIGH (ref 70–99)
Glucose-Capillary: 397 mg/dL — ABNORMAL HIGH (ref 70–99)

## 2022-03-12 LAB — FERRITIN: Ferritin: 234 ng/mL (ref 11–307)

## 2022-03-12 LAB — MAGNESIUM: Magnesium: 2.1 mg/dL (ref 1.7–2.4)

## 2022-03-12 LAB — FOLATE: Folate: 37.9 ng/mL (ref >5.9)

## 2022-03-12 LAB — HEMOGLOBIN A1C
Hgb A1c MFr Bld: 7.4 % — ABNORMAL HIGH (ref 4.8–5.6)
Mean Plasma Glucose: 165.68 mg/dL

## 2022-03-12 LAB — GLUCOSE, CAPILLARY: Glucose-Capillary: 232 mg/dL — ABNORMAL HIGH (ref 70–99)

## 2022-03-12 LAB — PHOSPHORUS: Phosphorus: 6.7 mg/dL — ABNORMAL HIGH (ref 2.5–4.6)

## 2022-03-12 LAB — PROCALCITONIN: Procalcitonin: <0.1 ng/mL

## 2022-03-12 LAB — BRAIN NATRIURETIC PEPTIDE: B Natriuretic Peptide: 127.8 pg/mL — ABNORMAL HIGH (ref 0.0–100.0)

## 2022-03-12 LAB — TROPONIN I (HIGH SENSITIVITY): Troponin I (High Sensitivity): 30 ng/L — ABNORMAL HIGH (ref <18)

## 2022-03-12 MED ORDER — METHYLPREDNISOLONE SODIUM SUCC 40 MG IJ SOLR
40.0000 mg | Freq: Two times a day (BID) | INTRAMUSCULAR | Status: DC
  Administered 2022-03-12: 40 mg via INTRAVENOUS
  Filled 2022-03-12: qty 1

## 2022-03-12 MED ORDER — SODIUM CHLORIDE 0.9 % IV SOLN
1.0000 g | INTRAVENOUS | Status: DC
  Administered 2022-03-12 – 2022-03-13 (×2): 1 g via INTRAVENOUS
  Filled 2022-03-12 (×3): qty 10

## 2022-03-12 MED ORDER — INSULIN ASPART 100 UNIT/ML IJ SOLN
4.0000 [IU] | Freq: Three times a day (TID) | INTRAMUSCULAR | Status: DC
  Administered 2022-03-12 – 2022-03-13 (×5): 4 [IU] via SUBCUTANEOUS
  Filled 2022-03-12: qty 0.04

## 2022-03-12 MED ORDER — SODIUM CHLORIDE 0.9 % IV SOLN
100.0000 mg | Freq: Two times a day (BID) | INTRAVENOUS | Status: DC
  Administered 2022-03-12 – 2022-03-13 (×4): 100 mg via INTRAVENOUS
  Filled 2022-03-12 (×6): qty 100

## 2022-03-12 MED ORDER — MELATONIN 3 MG PO TABS: 3.0000 mg | ORAL_TABLET | Freq: Every evening | ORAL | Status: DC | PRN

## 2022-03-12 MED ORDER — METHYLPREDNISOLONE SODIUM SUCC 40 MG IJ SOLR
40.0000 mg | Freq: Two times a day (BID) | INTRAMUSCULAR | Status: DC
  Administered 2022-03-12 – 2022-03-13 (×3): 40 mg via INTRAVENOUS
  Filled 2022-03-12 (×3): qty 1

## 2022-03-12 MED ORDER — POLYETHYLENE GLYCOL 3350 17 G PO PACK
17.0000 g | PACK | Freq: Every day | ORAL | Status: DC | PRN
Start: 2022-03-12 — End: 2022-03-12

## 2022-03-12 MED ORDER — FUROSEMIDE 10 MG/ML IJ SOLN
20.0000 mg | Freq: Two times a day (BID) | INTRAMUSCULAR | Status: AC
  Administered 2022-03-12 (×2): 20 mg via INTRAVENOUS
  Filled 2022-03-12 (×2): qty 4

## 2022-03-12 MED ORDER — ACETAMINOPHEN 325 MG PO TABS: 650.0000 mg | ORAL_TABLET | Freq: Four times a day (QID) | ORAL | Status: DC | PRN

## 2022-03-12 MED ORDER — SODIUM CHLORIDE 0.9 % IV SOLN: INTRAVENOUS | Status: DC

## 2022-03-12 MED ORDER — PROCHLORPERAZINE EDISYLATE 10 MG/2ML IJ SOLN: 10.0000 mg | Freq: Four times a day (QID) | INTRAMUSCULAR | Status: DC | PRN

## 2022-03-12 MED ORDER — FUROSEMIDE 10 MG/ML IJ SOLN: 40.0000 mg | Freq: Two times a day (BID) | INTRAMUSCULAR | Status: DC

## 2022-03-12 MED ORDER — POLYETHYLENE GLYCOL 3350 17 G PO PACK
17.0000 g | PACK | Freq: Two times a day (BID) | ORAL | Status: AC
  Administered 2022-03-12 – 2022-03-13 (×2): 17 g via ORAL
  Filled 2022-03-12 (×3): qty 1

## 2022-03-12 MED ORDER — INSULIN GLARGINE-YFGN 100 UNIT/ML ~~LOC~~ SOLN
20.0000 [IU] | Freq: Two times a day (BID) | SUBCUTANEOUS | Status: DC
  Administered 2022-03-12 – 2022-03-13 (×4): 20 [IU] via SUBCUTANEOUS
  Filled 2022-03-12 (×6): qty 0.2

## 2022-03-12 MED ORDER — LACTATED RINGERS IV SOLN: INTRAVENOUS | Status: DC

## 2022-03-12 NOTE — Progress Notes (Signed)
[facility] ?PROGRESS NOTE ? ? ? ?Progress Note  ?Kathleen Kane  UVO:[id number] DOB: Aug 24, 1961 DOA: 03/11/2022 ?PCP: [name], MD  ? ? ? ?Brief Narrative:  ? ?Kathleen Kane is an 61 y.o. female past medical history significant for pulmonary fibrosis by CT scan in May 2020 history, rheumatoid arthritis for which she is not taking methotrexate or steroids which he was previously on, essential hypertension, diabetes mellitus type 2, permanent atrial fibrillation on Eliquis.  Comes into the ED with gradual shortness of breath, she called EMS upon arrival her saturations were 80 she endorses a persistent cough with light mucus. ? ?Assessment/Plan:  ? ?Acute respiratory failure with hypoxia (HCC) possibly due to atypical pneumonia versus pulmonary fibrosis ?On 2 L oxygen saturation greater than 90%.  Try to wean off oxygen. ?Trace lower extremity edema she relates she cannot lay flat to sleep chest x-ray showed bilateral pulmonary infiltrates. ?He was given a dose of IV Lasix her creatinine trended up we will go ahead and discontinue Lasix. ?2D echo is pending. ?Continue steroids we will start her on Rocephin and azithromycin she has had persistent cough and hemoptysis initially she had leukocytosis which is now resolved. ? ?Acute kidney injury: ?With a baseline creatinine of 0.7 back in November 2019. ?On admission 1.7.  She was started on IV Lasix likely overdiuresis. ?Check orthostatics started on gentle IV fluid hydration ? ?Chronic atrial fibrillation ([place]): ?Continue metoprolol and Eliquis. ? ?Rheumatoid arthritis: ?She was previously on methotrexate and steroids the patient stopped taking it as she continues to get infections. ? ?Anemia of chronic disease: ?No signs of overt bleeding her hemoglobin seems to be stable. ? ?Physical debility: ?Consult physical therapy. ? ?Severe morbid obesity: ?Noted ? ? ?DVT prophylaxis: eliquis ?Family Communication:daughter ?Status is: Inpatient ?Remains inpatient  appropriate because: Acute respiratory failure with hypoxia likely due to pneumonia. ? ? ? ?Code Status:  ? ?  ?Code Status Orders  ?(From admission, onward)  ?  ? ? ?  ? ?  Start     Ordered  ? 03/11/22 2323  Full code  Continuous       ? 03/11/22 2322  ? ?  ?  ? ?  ? ?Code Status History   ? ? Date Active Date Inactive Code Status Order ID Comments User Context  ? 10/01/2018 1653 10/03/2018 1525 Full Code [id number]  Aline August, MD ED  ? 02/27/2013 1743 03/01/2013 1840 Full Code [id number]  Lahoma Crocker, MD Inpatient  ? ?  ? ? ? ? ?IV Access:  ? ?Peripheral IV ? ? ?Procedures and diagnostic studies:  ? ?CT ABDOMEN PELVIS W CONTRAST ? ?Result Date: 03/11/2022 ?CLINICAL DATA:  Right lower quadrant pain. EXAM: CT ABDOMEN AND PELVIS WITH CONTRAST TECHNIQUE: Multidetector CT imaging of the abdomen and pelvis was performed using the standard protocol following bolus administration of intravenous contrast. RADIATION DOSE REDUCTION: This exam was performed according to the departmental dose-optimization program which includes automated exposure control, adjustment of the mA and/or kV according to patient size and/or use of iterative reconstruction technique. CONTRAST:  68m OMNIPAQUE IOHEXOL 300 MG/ML  SOLN COMPARISON:  None Available. FINDINGS: Lower chest: Chronic interstitial lung disease partially visualized at the lung bases. Coronary artery atherosclerosis. Hepatobiliary: No focal liver abnormality is seen. No gallstones, gallbladder wall thickening, or biliary dilatation. Pancreas: Unremarkable. No pancreatic ductal dilatation or surrounding inflammatory changes. Spleen: Normal in size without focal abnormality. Adrenals/Urinary Tract: Adrenal glands are unremarkable. Kidneys are normal, without renal calculi, focal  lesion, or hydronephrosis. Bladder is unremarkable. Stomach/Bowel: Stomach is within normal limits. No evidence of bowel wall thickening, distention, or inflammatory changes. Diverticulosis without  evidence of diverticulitis. Normal appendix. Vascular/Lymphatic: Aortic atherosclerosis. No enlarged abdominal or pelvic lymph nodes. Reproductive: Uterus and bilateral adnexa are unremarkable. Other: No abdominal wall hernia or abnormality. No abdominopelvic ascites. Musculoskeletal: No acute osseous abnormality. No aggressive osseous lesion. Degenerative disease with disc height loss at L5-S1 with facet arthropathy. Moderate osteoarthritis of bilateral SI joints. IMPRESSION: 1. No acute abdominal or pelvic pathology. 2. Diverticulosis without evidence of diverticulitis. 3. Chronic interstitial lung disease partially visualized at the lung bases. 4. Aortic Atherosclerosis (ICD10-I70.0). Electronically Signed   By: Kathreen Devoid M.D.   On: 03/11/2022 14:25  ? ?DG Chest Port 1 View ? ?Result Date: 03/11/2022 ?CLINICAL DATA:  Shortness of breath EXAM: PORTABLE CHEST 1 VIEW COMPARISON:  03/09/2022, CT 03/09/2022, radiograph 12/29/2018 FINDINGS: Cardiomegaly with vascular congestion and diffuse bilateral interstitial and ground-glass opacity, suspicious for edema. No pleural effusion or pneumothorax. IMPRESSION: 1. Cardiomegaly with vascular congestion and diffuse interstitial and ground-glass opacity suggestive of edema. Electronically Signed   By: Donavan Foil M.D.   On: 03/11/2022 22:44   ? ? ?Medical Consultants:  ? ?None. ? ? ?Subjective:  ? ? ?Kathleen Kane she relates her breathing is now better. ? ?Objective:  ? ? ?Vitals:  ? 03/12/22 0100 03/12/22 0200 03/12/22 0300 03/12/22 0607  ?BP: 110/67 115/71 115/70 (!) 144/82  ?Pulse: 90 86 85 94  ?Resp: '17 14 10 14  '$ ?Temp:    97.7 ?F (36.5 ?C)  ?TempSrc:    Oral  ?SpO2: 98% 94% 98% 94%  ? ?SpO2: 94 % ?O2 Flow Rate (L/min): 2 L/min ? ?No intake or output data in the 24 hours ending 03/12/22 0858 ?There were no vitals filed for this visit. ? ?Exam: ?General exam: In no acute distress. ?Respiratory system: Good air movement and diffuse crackles ?Cardiovascular system: S1  & S2 heard, RRR. No JVD. ?Gastrointestinal system: Abdomen is nondistended, soft and nontender.  ?Extremities: No pedal edema. ?Skin: No rashes, lesions or ulcers ?Psychiatry: Judgement and insight appear normal. Mood & affect appropriate.  ? ? ?Data Reviewed:  ? ? ?Labs: ?Basic Metabolic Panel: ?Recent Labs  ?Lab 03/09/22 ?1735 03/11/22 ?4315 03/11/22 ?2245 03/11/22 ?2250 03/12/22 ?0500  ?NA 140 137 134* 133* 133*  ?K 4.2 3.5 4.2 4.2 4.7  ?CL 111 103 98 97* 98  ?CO2 21* 20* 24  --  21*  ?GLUCOSE 153* 218* 260* 254* 286*  ?BUN 21 18 30* 29* 37*  ?CREATININE 0.88 1.06* 1.68* 1.70* 1.61*  ?CALCIUM 9.6 10.0 9.4  --  9.1  ?MG  --   --   --   --  2.1  ?PHOS  --   --   --   --  6.7*  ? ?GFR ?Estimated Creatinine Clearance: 40.4 mL/min (A) (by C-G formula based on SCr of 1.61 mg/dL (H)). ?Liver Function Tests: ?Recent Labs  ?Lab 03/11/22 ?4008 03/12/22 ?0500  ?AST 17 14*  ?ALT 21 17  ?ALKPHOS 68 61  ?BILITOT 1.2 1.0  ?PROT 7.1 7.1  ?ALBUMIN 3.8 3.6  ? ?Recent Labs  ?Lab 03/11/22 ?6761  ?LIPASE 33  ? ?No results for input(s): AMMONIA in the last 168 hours. ?Coagulation profile ?No results for input(s): INR, PROTIME in the last 168 hours. ?COVID-19 Labs ? ?No results for input(s): DDIMER, FERRITIN, LDH, CRP in the last 72 hours. ? ?Lab Results  ?Component Value  Date  ? [place] NEGATIVE 03/12/2022  ? ? ?CBC: ?Recent Labs  ?Lab 03/09/22 ?1735 03/11/22 ?0175 03/11/22 ?2245 03/11/22 ?2250 03/12/22 ?0500  ?WBC 11.4* 10.7* 9.4  --  8.4  ?NEUTROABS  --  7.8* 7.2  --  7.8*  ?HGB 10.3* 11.6* 10.6* 11.6* 10.9*  ?HCT 32.8* 36.6 34.7* 34.0* 33.9*  ?MCV 85.4 84.5 84.8  --  85.0  ?PLT 309 306 297  --  298  ? ?Cardiac Enzymes: ?No results for input(s): CKTOTAL, CKMB, CKMBINDEX, TROPONINI in the last 168 hours. ?BNP (last 3 results) ?No results for input(s): PROBNP in the last 8760 hours. ?CBG: ?Recent Labs  ?Lab 03/12/22 ?0017 03/12/22 ?1025  ?GLUCAP 188* 291*  ? ?D-Dimer: ?No results for input(s): DDIMER in the last 72 hours. ?Hgb  A1c: ?No results for input(s): HGBA1C in the last 72 hours. ?Lipid Profile: ?No results for input(s): CHOL, HDL, LDLCALC, TRIG, CHOLHDL, LDLDIRECT in the last 72 hours. ?Thyroid function studies: ?No resul

## 2022-03-12 NOTE — Evaluation (Signed)
Occupational Therapy Evaluation ?Patient Details ?Name: Kathleen Kane ?MRN: [id number] ?DOB: February 27, 1961 ?Today's Date: 03/12/2022 ? ? ?History of Present Illness Patient is a 61 year old female admitted with gradual shortness of breath. EMS called and upon arrival pt saturations in 80s. Pt diagnosed Acute respiratory failure with hypoxia possibly due to atypical pneumonia versus pulmonary fibrosis. PMH: pulmonary fibrosis, rheumatoid arthritis, essential hypertension, diabetes mellitus type 2, permanent atrial fibrillation  ? ?Clinical Impression ?  ?Patient lives at home with spouse and daughter and is typically mod I with ambulation using cane or walker as needed. Due to RA spouse/daughter assist patient as needed with dressing/bathing "I drop things and it can be hard to pull up my clothes." Patient overall min G with ambulation using cane and hand held assist with desat to 80% on room air. Patient needing seated recovery, donned 2L and max cues for diaphragmatic breathing. Patient recover back to mid 90s% within ~45 seconds rest. Patient also demonstrate ability to pull up socks while seated at edge of bed. Anticipate patient able to discharge home with family support and currently recommending [place], will continue to follow. ?   ? ?Recommendations for follow up therapy are one component of a multi-disciplinary discharge planning process, led by the attending physician.  Recommendations may be updated based on patient status, additional functional criteria and insurance authorization.  ? ?Follow Up Recommendations ? Home health OT  ?  ?Assistance Recommended at Discharge Intermittent Supervision/Assistance  ?Patient can return home with the following A little help with walking and/or transfers;A little help with bathing/dressing/bathroom;Help with stairs or ramp for entrance ? ?  ?Functional Status Assessment ? Patient has had a recent decline in their functional status and demonstrates the ability to make  significant improvements in function in a reasonable and predictable amount of time.  ?Equipment Recommendations ? None recommended by OT  ?  ?   ?Precautions / Restrictions Precautions ?Precautions: Fall ?Precaution Comments: monitor sats ?Restrictions ?Weight Bearing Restrictions: No  ? ?  ? ?Mobility Bed Mobility ?Overal bed mobility: Needs Assistance ?Bed Mobility: Sit to Supine ?  ?  ?  ?Sit to supine: Min guard ?  ?General bed mobility comments: for safety ?  ? ? ? ?  ?Balance Overall balance assessment: Needs assistance ?Sitting-balance support: Feet supported ?Sitting balance-Leahy Scale: Fair ?  ?  ?Standing balance support: Single extremity supported, Bilateral upper extremity supported ?Standing balance-Leahy Scale: Poor ?  ?  ?  ?  ?  ?  ?  ?  ?  ?  ?  ?  ?   ? ?ADL either performed or assessed with clinical judgement  ? ?ADL Overall ADL's : Needs assistance/impaired ?Eating/Feeding: Independent ?  ?Grooming: Set up;Sitting ?  ?Upper Body Bathing: Minimal assistance;Sitting ?  ?Lower Body Bathing: Minimal assistance;Sitting/lateral leans;Sit to/from stand ?  ?Upper Body Dressing : Minimal assistance;Sitting;Standing ?  ?Lower Body Dressing: Minimal assistance;Sit to/from stand;Sitting/lateral leans ?Lower Body Dressing Details (indicate cue type and reason): Patient demonstrates ability to pull up socks sitting on edge of bed. Min A in standing for stability ?Toilet Transfer: Minimal assistance;Ambulation (cane) ?Toilet Transfer Details (indicate cue type and reason): Patient using cane to ambulate in room and hand held assist for steadying. Patient desat to 80% on room air with ambulating around gurney and back to bed ?Toileting- Clothing Manipulation and Hygiene: Minimal assistance;Sit to/from stand;Sitting/lateral lean ?  ?  ?  ?Functional mobility during ADLs: Minimal assistance;Cane ?General ADL Comments: Educate patient in diaphragmatic  breathing techniques needing max cues  ? ? ? ? ?Pertinent  Vitals/Pain Pain Assessment ?Pain Assessment: No/denies pain  ? ? ? ?Hand Dominance Right ?  ?Extremity/Trunk Assessment Upper Extremity Assessment ?Upper Extremity Assessment: Overall WFL for tasks assessed ?  ?Lower Extremity Assessment ?Lower Extremity Assessment: Defer to PT evaluation ?  ?  ?  ?Communication Communication ?Communication: No difficulties ?  ?Cognition Arousal/Alertness: Awake/alert ?Behavior During Therapy: [facility] for tasks assessed/performed ?Overall Cognitive Status: Within Functional Limits for tasks assessed ?  ?  ?  ?  ?  ?  ?  ?  ?  ?  ?  ?  ?  ?  ?  ?  ?  ?  ?  ?General Comments  Patient desat to 80% on room air with ambulation, donned 2L and cues for diaphragmatic breathing with recovery to 94% on 2L ~45 seconds seated rest ? ?  ?   ?   ? ? ?Home Living Family/patient expects to be discharged to:: Private residence ?Living Arrangements: Spouse/significant other;Children ?Available Help at Discharge: Family;Available 24 hours/day ?Type of Home: House ?Home Access: Stairs to enter ?Entrance Stairs-Number of Steps: 4 ?Entrance Stairs-Rails: None ?Home Layout: One level ?  ?  ?Bathroom Shower/Tub: Tub/shower unit ?  ?Bathroom Toilet: Standard ?  ?  ?Home Equipment: Cane - single point;Rolling Walker (2 wheels);BSC/3in1;Shower seat ?  ?  ?  ? ?  ?Prior Functioning/Environment Prior Level of Function : Needs assist ?  ?  ?  ?  ?  ?  ?Mobility Comments: Ambulates with cane or walker ?ADLs Comments: Patient needs assist with dressing/bathing 2* RA ?  ? ?  ?  ?OT Problem List: Decreased activity tolerance;Impaired balance (sitting and/or standing);Cardiopulmonary status limiting activity;Obesity ?  ?   ?OT Treatment/Interventions: Self-care/ADL training;Balance training;Patient/family education;Therapeutic activities  ?  ?OT Goals(Current goals can be found in the care plan section) Acute Rehab OT Goals ?Patient Stated Goal: Feel better ?OT Goal Formulation: With patient ?Time For Goal Achievement:  03/26/22 ?Potential to Achieve Goals: Good  ?OT Frequency: Min 2X/week ?  ? ?   ?AM-PAC OT "6 Clicks" Daily Activity     ?Outcome Measure Help from another person eating meals?: None ?Help from another person taking care of personal grooming?: A Little ?Help from another person toileting, which includes using toliet, bedpan, or urinal?: A Little ?Help from another person bathing (including washing, rinsing, drying)?: A Little ?Help from another person to put on and taking off regular upper body clothing?: A Little ?Help from another person to put on and taking off regular lower body clothing?: A Little ?6 Click Score: 19 ?  ?End of Session Equipment Utilized During Treatment: Oxygen (cane) ?Nurse Communication: Mobility status ? ?Activity Tolerance: Patient tolerated treatment well ?Patient left: in bed;with call bell/phone within reach;with family/visitor present ? ?OT Visit Diagnosis: Unsteadiness on feet (R26.81)  ?              ?Time: [phone] ?OT Time Calculation (min): 15 min ?Charges:  OT General Charges ?$OT Visit: 1 Visit ?OT Evaluation ?$OT Eval Low Complexity: 1 Low ? ?Delbert Phenix OT ?OT pager: [phone] ? ? ?Rosemary Holms ?03/12/2022, 12:52 PM ?

## 2022-03-13 LAB — GLUCOSE, CAPILLARY
Glucose-Capillary: 273 mg/dL — ABNORMAL HIGH (ref 70–99)
Glucose-Capillary: 274 mg/dL — ABNORMAL HIGH (ref 70–99)
Glucose-Capillary: 189 mg/dL — ABNORMAL HIGH (ref 70–99)
Glucose-Capillary: 284 mg/dL — ABNORMAL HIGH (ref 70–99)

## 2022-03-13 LAB — BASIC METABOLIC PANEL
Anion gap: 12 (ref 5–15)
BUN: 47 mg/dL — ABNORMAL HIGH (ref 8–23)
CO2: 22 mmol/L (ref 22–32)
Calcium: 9.1 mg/dL (ref 8.9–10.3)
Chloride: 101 mmol/L (ref 98–111)
Creatinine, Ser: 1.6 mg/dL — ABNORMAL HIGH (ref 0.44–1.00)
GFR, Estimated: 36 mL/min — ABNORMAL LOW (ref >60)
Glucose, Bld: 277 mg/dL — ABNORMAL HIGH (ref 70–99)
Potassium: 4.9 mmol/L (ref 3.5–5.1)
Sodium: 135 mmol/L (ref 135–145)

## 2022-03-13 LAB — PROCALCITONIN: Procalcitonin: <0.1 ng/mL

## 2022-03-13 MED ORDER — IPRATROPIUM-ALBUTEROL 0.5-2.5 (3) MG/3ML IN SOLN
3.0000 mL | Freq: Three times a day (TID) | RESPIRATORY_TRACT | Status: DC
  Administered 2022-03-13: 3 mL via RESPIRATORY_TRACT
  Filled 2022-03-13: qty 3

## 2022-03-13 MED ORDER — GUAIFENESIN 100 MG/5ML PO LIQD
5.0000 mL | ORAL | Status: DC | PRN
  Administered 2022-03-13 – 2022-03-15 (×4): 5 mL via ORAL
  Filled 2022-03-13 (×4): qty 10

## 2022-03-13 MED ORDER — IPRATROPIUM-ALBUTEROL 0.5-2.5 (3) MG/3ML IN SOLN
3.0000 mL | Freq: Two times a day (BID) | RESPIRATORY_TRACT | Status: DC
  Administered 2022-03-13 – 2022-03-14 (×2): 3 mL via RESPIRATORY_TRACT
  Filled 2022-03-13 (×2): qty 3

## 2022-03-13 NOTE — Plan of Care (Signed)
?  Problem: Clinical Measurements: ?Goal: Will remain free from infection ?Outcome: Progressing ?Goal: Diagnostic test results will improve ?Outcome: Progressing ?Goal: Respiratory complications will improve ?Outcome: Progressing ?  ?

## 2022-03-13 NOTE — Progress Notes (Signed)
[facility] ?PROGRESS NOTE ? ? ? ?Progress Note  ?Kathleen Kane  HMC:[id number] DOB: Apr 18, 1961 DOA: 03/11/2022 ?PCP: [name], MD  ? ? ? ?Brief Narrative:  ? ?Kathleen Kane is an 61 y.o. female past medical history significant for pulmonary fibrosis by CT scan in May 2020 history, rheumatoid arthritis for which she is not taking methotrexate or steroids which he was previously on, essential hypertension, diabetes mellitus type 2, permanent atrial fibrillation on Eliquis.  Comes into the ED with gradual shortness of breath, she called EMS upon arrival her saturations were 80 she endorses a persistent cough with light mucus. ? ?Assessment/Plan:  ? ?Acute respiratory failure with hypoxia (HCC) possibly due to atypical pneumonia versus pulmonary fibrosis ?On 4 L oxygen saturation greater than 90%.  Try to wean down on her oxygen. ?Has remained afebrile leukocytosis is improved. ?Continue IV Rocephin and and doxycycline. ?Continue IV steroids ?2D echo was done that showed possible restricted posterior mitral valve leaflet with moderate MR, EF of 55% no regional wall motion abnormality left ventricular function cannot be evaluated. ? ?Acute kidney injury: ?With a baseline creatinine of 0.7 back in November 2019. ?On admission 1.7.  She is likely over diuresed. ?Lasix was held. ?She was started on gentle IV fluid hydration her creatinine has remained stable hopefully will come down tomorrow repeat a basic metabolic panel tomorrow morning. ? ?Chronic atrial fibrillation ([place]): ?Continue metoprolol and Eliquis. ? ?Rheumatoid arthritis: ?She was previously on methotrexate and steroids the patient stopped taking it as she continues to get infections. ?Probably her rheumatoid arthritis is leading to her pulmonary fibrosis we will need to get pulmonary involved to follow-up as an outpatient. ? ?Anemia of chronic disease: ?No signs of overt bleeding her hemoglobin seems to be stable. ? ?Physical  debility: ?Occupational Therapy evaluated the patient recommended home health PT. ? ?Severe morbid obesity: ?Noted ? ? ?DVT prophylaxis: eliquis ?Family Communication:daughter ?Status is: Inpatient ?Remains inpatient appropriate because: Acute respiratory failure with hypoxia likely due to pneumonia. ? ? ? ?Code Status:  ? ?  ?Code Status Orders  ?(From admission, onward)  ?  ? ? ?  ? ?  Start     Ordered  ? 03/11/22 2323  Full code  Continuous       ? 03/11/22 2322  ? ?  ?  ? ?  ? ?Code Status History   ? ? Date Active Date Inactive Code Status Order ID Comments User Context  ? 10/01/2018 1653 10/03/2018 1525 Full Code [id number]  Aline August, MD ED  ? 02/27/2013 1743 03/01/2013 1840 Full Code [id number]  Lahoma Crocker, MD Inpatient  ? ?  ? ? ? ? ?IV Access:  ? ?Peripheral IV ? ? ?Procedures and diagnostic studies:  ? ?CT ABDOMEN PELVIS W CONTRAST ? ?Result Date: 03/11/2022 ?CLINICAL DATA:  Right lower quadrant pain. EXAM: CT ABDOMEN AND PELVIS WITH CONTRAST TECHNIQUE: Multidetector CT imaging of the abdomen and pelvis was performed using the standard protocol following bolus administration of intravenous contrast. RADIATION DOSE REDUCTION: This exam was performed according to the departmental dose-optimization program which includes automated exposure control, adjustment of the mA and/or kV according to patient size and/or use of iterative reconstruction technique. CONTRAST:  38m OMNIPAQUE IOHEXOL 300 MG/ML  SOLN COMPARISON:  None Available. FINDINGS: Lower chest: Chronic interstitial lung disease partially visualized at the lung bases. Coronary artery atherosclerosis. Hepatobiliary: No focal liver abnormality is seen. No gallstones, gallbladder wall thickening, or biliary dilatation. Pancreas: Unremarkable. No pancreatic ductal  dilatation or surrounding inflammatory changes. Spleen: Normal in size without focal abnormality. Adrenals/Urinary Tract: Adrenal glands are unremarkable. Kidneys are normal, without  renal calculi, focal lesion, or hydronephrosis. Bladder is unremarkable. Stomach/Bowel: Stomach is within normal limits. No evidence of bowel wall thickening, distention, or inflammatory changes. Diverticulosis without evidence of diverticulitis. Normal appendix. Vascular/Lymphatic: Aortic atherosclerosis. No enlarged abdominal or pelvic lymph nodes. Reproductive: Uterus and bilateral adnexa are unremarkable. Other: No abdominal wall hernia or abnormality. No abdominopelvic ascites. Musculoskeletal: No acute osseous abnormality. No aggressive osseous lesion. Degenerative disease with disc height loss at L5-S1 with facet arthropathy. Moderate osteoarthritis of bilateral SI joints. IMPRESSION: 1. No acute abdominal or pelvic pathology. 2. Diverticulosis without evidence of diverticulitis. 3. Chronic interstitial lung disease partially visualized at the lung bases. 4. Aortic Atherosclerosis (ICD10-I70.0). Electronically Signed   By: Kathreen Devoid M.D.   On: 03/11/2022 14:25  ? ?DG Chest Port 1 View ? ?Result Date: 03/11/2022 ?CLINICAL DATA:  Shortness of breath EXAM: PORTABLE CHEST 1 VIEW COMPARISON:  03/09/2022, CT 03/09/2022, radiograph 12/29/2018 FINDINGS: Cardiomegaly with vascular congestion and diffuse bilateral interstitial and ground-glass opacity, suspicious for edema. No pleural effusion or pneumothorax. IMPRESSION: 1. Cardiomegaly with vascular congestion and diffuse interstitial and ground-glass opacity suggestive of edema. Electronically Signed   By: Donavan Foil M.D.   On: 03/11/2022 22:44   ? ? ?Medical Consultants:  ? ?None. ? ? ?Subjective:  ? ? ?[name] relates her breathing is better is having a productive cough. ? ?Objective:  ? ? ?Vitals:  ? 03/12/22 1802 03/12/22 2212 03/13/22 0210 03/13/22 0602  ?BP: 140/69 112/62 (!) 146/74 117/61  ?Pulse: 86 97 64 81  ?Resp: '18 19 17 17  '$ ?Temp: 98.1 ?F (36.7 ?C) 97.6 ?F (36.4 ?C) 98.2 ?F (36.8 ?C) 97.7 ?F (36.5 ?C)  ?TempSrc:  Oral    ?SpO2: 100% 99% 93%  99%  ? ?SpO2: 99 % ?O2 Flow Rate (L/min): 4 L/min ? ? ?Intake/Output Summary (Last 24 hours) at 03/13/2022 0737 ?Last data filed at 03/13/2022 0244 ?Gross per 24 hour  ?Intake 2036.59 ml  ?Output 500 ml  ?Net 1536.59 ml  ? ?There were no vitals filed for this visit. ? ?Exam: ?General exam: In no acute distress. ?Respiratory system: Good air movement and clear to auscultation. ?Cardiovascular system: S1 & S2 heard, RRR. No JVD. ?Gastrointestinal system: Abdomen is nondistended, soft and nontender.  ?Extremities: No pedal edema. ?Skin: No rashes, lesions or ulcers ?Psychiatry: Judgement and insight appear normal. Mood & affect appropriate. ? ? ?Data Reviewed:  ? ? ?Labs: ?Basic Metabolic Panel: ?Recent Labs  ?Lab 03/09/22 ?1735 03/11/22 ?0093 03/11/22 ?2245 03/11/22 ?2250 03/12/22 ?0500 03/13/22 ?0454  ?NA 140 137 134* 133* 133* 135  ?K 4.2 3.5 4.2 4.2 4.7 4.9  ?CL 111 103 98 97* 98 101  ?CO2 21* 20* 24  --  21* 22  ?GLUCOSE 153* 218* 260* 254* 286* 277*  ?BUN 21 18 30* 29* 37* 47*  ?CREATININE 0.88 1.06* 1.68* 1.70* 1.61* 1.60*  ?CALCIUM 9.6 10.0 9.4  --  9.1 9.1  ?MG  --   --   --   --  2.1  --   ?PHOS  --   --   --   --  6.7*  --   ? ? ?GFR ?Estimated Creatinine Clearance: 40.7 mL/min (A) (by C-G formula based on SCr of 1.6 mg/dL (H)). ?Liver Function Tests: ?Recent Labs  ?Lab 03/11/22 ?8182 03/12/22 ?0500  ?AST 17 14*  ?ALT 21  17  ?ALKPHOS 68 61  ?BILITOT 1.2 1.0  ?PROT 7.1 7.1  ?ALBUMIN 3.8 3.6  ? ? ?Recent Labs  ?Lab 03/11/22 ?0350  ?LIPASE 33  ? ? ?No results for input(s): AMMONIA in the last 168 hours. ?Coagulation profile ?No results for input(s): INR, PROTIME in the last 168 hours. ?COVID-19 Labs ? ?Recent Labs  ?  03/12/22 ?1000  ?FERRITIN 234  ? ? ?Lab Results  ?Component Value Date  ? [place] NEGATIVE 03/12/2022  ? ? ?CBC: ?Recent Labs  ?Lab 03/09/22 ?1735 03/11/22 ?0938 03/11/22 ?2245 03/11/22 ?2250 03/12/22 ?0500  ?WBC 11.4* 10.7* 9.4  --  8.4  ?NEUTROABS  --  7.8* 7.2  --  7.8*  ?HGB 10.3* 11.6* 10.6*  11.6* 10.9*  ?HCT 32.8* 36.6 34.7* 34.0* 33.9*  ?MCV 85.4 84.5 84.8  --  85.0  ?PLT 309 306 297  --  298  ? ? ?Cardiac Enzymes: ?No results for input(s): CKTOTAL, CKMB, CKMBINDEX, TROPONINI in the last 168 hours. ?BNP (las

## 2022-03-13 NOTE — Evaluation (Signed)
Physical Therapy Evaluation ?Patient Details ?Name: Kathleen Kane ?MRN: [id number] ?DOB: [birth year] ?Today's Date: 03/13/2022 ? ?History of Present Illness ? Patient is a 61 year old female admitted with gradual shortness of breath. EMS called and upon arrival pt saturations in 80s. Pt diagnosed Acute respiratory failure with hypoxia possibly due to atypical pneumonia versus pulmonary fibrosis. PMH: pulmonary fibrosis, rheumatoid arthritis, essential hypertension, diabetes mellitus type 2, permanent atrial fibrillation  ?Clinical Impression ? Pt admitted with above diagnosis.  Pt currently with functional limitations due to the deficits listed below (see PT Problem List). Pt will benefit from skilled PT to increase their independence and safety with mobility to allow discharge to the venue listed below.  Recommend HHPT, but if makes good progress in acute care, may be ok with just family support. ?   ?   ? ?Recommendations for follow up therapy are one component of a multi-disciplinary discharge planning process, led by the attending physician.  Recommendations may be updated based on patient status, additional functional criteria and insurance authorization. ? ?Follow Up Recommendations Home health PT ? ?  ?Assistance Recommended at Discharge PRN  ?Patient can return home with the following ? Help with stairs or ramp for entrance ? ?  ?Equipment Recommendations None recommended by PT  ?Recommendations for Other Services ?    ?  ?Functional Status Assessment Patient has had a recent decline in their functional status and demonstrates the ability to make significant improvements in function in a reasonable and predictable amount of time.  ? ?  ?Precautions / Restrictions Precautions ?Precautions: Fall ?Restrictions ?Weight Bearing Restrictions: No  ? ?  ? ?Mobility ? Bed Mobility ?Overal bed mobility: Modified Independent ?Bed Mobility: Supine to Sit ?  ?  ?Supine to sit: HOB elevated ?  ?  ?  ?  ? ?Transfers ?Overall  transfer level: Needs assistance ?  ?Transfers: Sit to/from Stand, Bed to chair/wheelchair/BSC ?Sit to Stand: Supervision ?Stand pivot transfers: Min guard ?  ?  ?  ?  ?General transfer comment: cues for hand placement with sit <> stand, but no physical A ?  ? ?Ambulation/Gait ?  ?  ?Assistive device: Rolling walker (2 wheels) ?Gait Pattern/deviations: Step-through pattern, Decreased step length - right, Decreased step length - left, Knees buckling ?Gait velocity: decreased ?  ?  ?General Gait Details: One episode of R knee with slight buckle which she self corrected. No complaints of SOB, but did turn around so she wouldn't over due gait distance. ? ?Stairs ?  ?  ?  ?  ?  ? ?Wheelchair Mobility ?  ? ?Modified Rankin (Stroke Patients Only) ?  ? ?  ? ?Balance Overall balance assessment: Needs assistance ?Sitting-balance support: Feet supported ?Sitting balance-Leahy Scale: Fair ?  ?  ?  ?Standing balance-Leahy Scale: Fair ?  ?  ?  ?  ?  ?  ?  ?  ?  ?  ?  ?  ?   ? ? ? ?Pertinent Vitals/Pain Pain Assessment ?Pain Assessment: No/denies pain  ? ? ?Home Living Family/patient expects to be discharged to:: Private residence ?Living Arrangements: Spouse/significant other;Children ?Available Help at Discharge: Family;Available 24 hours/day ?Type of Home: House ?Home Access: Stairs to enter ?Entrance Stairs-Rails: None ?Entrance Stairs-Number of Steps: 4 ?  ?Home Layout: One level ?Home Equipment: Cane - single point;Rolling Walker (2 wheels);BSC/3in1;Shower seat ?   ?  ?Prior Function Prior Level of Function : Needs assist ?  ?  ?  ?  ?  ?  ?  Mobility Comments: Ambulates with cane or walker ?ADLs Comments: Patient needs assist with dressing/bathing 2* RA ?  ? ? ?Hand Dominance  ? Dominant Hand: Right ? ?  ?Extremity/Trunk Assessment  ? Upper Extremity Assessment ?Upper Extremity Assessment: Defer to OT evaluation ?  ? ?Lower Extremity Assessment ?Lower Extremity Assessment: Generalized weakness ?  ? ?Cervical / Trunk  Assessment ?Cervical / Trunk Assessment: Normal  ?Communication  ? Communication: No difficulties  ?Cognition Arousal/Alertness: Awake/alert ?Behavior During Therapy: [facility] for tasks assessed/performed ?Overall Cognitive Status: Within Functional Limits for tasks assessed ?  ?  ?  ?  ?  ?  ?  ?  ?  ?  ?  ?  ?  ?  ?  ?  ?  ?  ?  ? ?  ?General Comments   ? ?  ?Exercises    ? ?Assessment/Plan  ?  ?PT Assessment Patient needs continued PT services  ?PT Problem List Decreased mobility;Decreased activity tolerance;Decreased strength;Cardiopulmonary status limiting activity ? ?   ?  ?PT Treatment Interventions DME instruction;Functional mobility training;Balance training;Therapeutic exercise;Gait training;Therapeutic activities   ? ?PT Goals (Current goals can be found in the Care Plan section)  ?Acute Rehab PT Goals ?Patient Stated Goal: go back to the [facility] for exercise ?PT Goal Formulation: With patient ?Time For Goal Achievement: 03/27/22 ?Potential to Achieve Goals: Good ? ?  ?Frequency Min 3X/week ?  ? ? ?Co-evaluation   ?  ?  ?  ?  ? ? ?  ?AM-PAC PT "6 Clicks" Mobility  ?Outcome Measure Help needed turning from your back to your side while in a flat bed without using bedrails?: None ?Help needed moving from lying on your back to sitting on the side of a flat bed without using bedrails?: None ?Help needed moving to and from a bed to a chair (including a wheelchair)?: A Little ?Help needed standing up from a chair using your arms (e.g., wheelchair or bedside chair)?: A Little ?Help needed to walk in hospital room?: A Little ?Help needed climbing 3-5 steps with a railing? : A Little ?6 Click Score: 20 ? ?  ?End of Session Equipment Utilized During Treatment: Gait belt ?Activity Tolerance: Patient tolerated treatment well ?Patient left: in chair;with call bell/phone within reach;with chair alarm set ?Nurse Communication: Mobility status ?PT Visit Diagnosis: Unsteadiness on feet (R26.81) ?  ? ?Time: 7510-2585 ?PT Time  Calculation (min) (ACUTE ONLY): 28 min ? ? ?Charges:   PT Evaluation ?$PT Eval Moderate Complexity: 1 Mod ?PT Treatments ?$Gait Training: 8-22 mins ?  ?   ? ? ?Santiago Glad L. Tamala Julian, PT ? ?03/13/2022 ? ? ?Galen Manila ?03/13/2022, 12:54 PM ? ?

## 2022-03-14 DIAGNOSIS — J209 Acute bronchitis, unspecified: Secondary | ICD-10-CM

## 2022-03-14 DIAGNOSIS — Z794 Long term (current) use of insulin: Secondary | ICD-10-CM

## 2022-03-14 DIAGNOSIS — E1165 Type 2 diabetes mellitus with hyperglycemia: Secondary | ICD-10-CM

## 2022-03-14 LAB — BASIC METABOLIC PANEL
Anion gap: 9 (ref 5–15)
BUN: 47 mg/dL — ABNORMAL HIGH (ref 8–23)
CO2: 22 mmol/L (ref 22–32)
Calcium: 9 mg/dL (ref 8.9–10.3)
Chloride: 105 mmol/L (ref 98–111)
Creatinine, Ser: 1.62 mg/dL — ABNORMAL HIGH (ref 0.44–1.00)
GFR, Estimated: 35 mL/min — ABNORMAL LOW (ref >60)
Glucose, Bld: 317 mg/dL — ABNORMAL HIGH (ref 70–99)
Potassium: 5.4 mmol/L — ABNORMAL HIGH (ref 3.5–5.1)
Sodium: 136 mmol/L (ref 135–145)

## 2022-03-14 LAB — PROCALCITONIN: Procalcitonin: <0.1 ng/mL

## 2022-03-14 LAB — GLUCOSE, CAPILLARY
Glucose-Capillary: 313 mg/dL — ABNORMAL HIGH (ref 70–99)
Glucose-Capillary: 246 mg/dL — ABNORMAL HIGH (ref 70–99)
Glucose-Capillary: 219 mg/dL — ABNORMAL HIGH (ref 70–99)
Glucose-Capillary: 254 mg/dL — ABNORMAL HIGH (ref 70–99)

## 2022-03-14 MED ORDER — SODIUM POLYSTYRENE SULFONATE 15 GM/60ML PO SUSP
30.0000 g | Freq: Once | ORAL | Status: AC
  Administered 2022-03-14: 30 g via ORAL
  Filled 2022-03-14: qty 120

## 2022-03-14 MED ORDER — LOSARTAN POTASSIUM 25 MG PO TABS: 25.0000 mg | ORAL_TABLET | Freq: Every day | ORAL | Status: DC

## 2022-03-14 MED ORDER — METOPROLOL TARTRATE 25 MG PO TABS
25.0000 mg | ORAL_TABLET | Freq: Two times a day (BID) | ORAL | Status: DC
  Administered 2022-03-14 – 2022-03-15 (×3): 25 mg via ORAL
  Filled 2022-03-14 (×3): qty 1

## 2022-03-14 MED ORDER — FERROUS SULFATE 325 (65 FE) MG PO TABS
325.0000 mg | ORAL_TABLET | Freq: Every day | ORAL | Status: DC
  Administered 2022-03-14 – 2022-03-15 (×2): 325 mg via ORAL
  Filled 2022-03-14 (×2): qty 1

## 2022-03-14 MED ORDER — DOXYCYCLINE HYCLATE 100 MG PO TABS
100.0000 mg | ORAL_TABLET | Freq: Two times a day (BID) | ORAL | Status: DC
  Administered 2022-03-14 – 2022-03-15 (×3): 100 mg via ORAL
  Filled 2022-03-14 (×3): qty 1

## 2022-03-14 MED ORDER — INSULIN GLARGINE-YFGN 100 UNIT/ML ~~LOC~~ SOLN
30.0000 [IU] | Freq: Two times a day (BID) | SUBCUTANEOUS | Status: DC
  Administered 2022-03-14 – 2022-03-15 (×3): 30 [IU] via SUBCUTANEOUS
  Filled 2022-03-14 (×4): qty 0.3

## 2022-03-14 MED ORDER — IPRATROPIUM-ALBUTEROL 0.5-2.5 (3) MG/3ML IN SOLN: 3.0000 mL | RESPIRATORY_TRACT | Status: DC | PRN

## 2022-03-14 MED ORDER — FUROSEMIDE 40 MG PO TABS
40.0000 mg | ORAL_TABLET | Freq: Two times a day (BID) | ORAL | Status: DC
  Administered 2022-03-14 – 2022-03-15 (×3): 40 mg via ORAL
  Filled 2022-03-14 (×3): qty 1

## 2022-03-14 MED ORDER — INSULIN ASPART 100 UNIT/ML IJ SOLN
6.0000 [IU] | Freq: Three times a day (TID) | INTRAMUSCULAR | Status: DC
  Administered 2022-03-14 (×3): 6 [IU] via SUBCUTANEOUS

## 2022-03-14 MED ORDER — PREDNISONE 50 MG PO TABS
50.0000 mg | ORAL_TABLET | Freq: Every day | ORAL | Status: DC
  Administered 2022-03-14 – 2022-03-15 (×2): 50 mg via ORAL
  Filled 2022-03-14 (×2): qty 1

## 2022-03-14 MED ORDER — CEFDINIR 300 MG PO CAPS
300.0000 mg | ORAL_CAPSULE | Freq: Two times a day (BID) | ORAL | Status: DC
  Administered 2022-03-14 – 2022-03-15 (×3): 300 mg via ORAL
  Filled 2022-03-14 (×3): qty 1

## 2022-03-14 NOTE — Progress Notes (Signed)
[facility] ?PROGRESS NOTE ? ? ? ?Progress Note  ?Kathleen Kane  TAV:[id number] DOB: 04-14-1961 DOA: 03/11/2022 ?PCP: [name], MD  ? ? ? ?Brief Narrative:  ? ?Kathleen Kane is an 62 y.o. female past medical history significant for pulmonary fibrosis by CT scan in May 2020 history, rheumatoid arthritis for which she is not taking methotrexate or steroids which he was previously on, essential hypertension, diabetes mellitus type 2, permanent atrial fibrillation on Eliquis.  Comes into the ED with gradual shortness of breath, she called EMS upon arrival her saturations were 80 she endorses a persistent cough with light mucus. 2D echo was done that showed possible restricted posterior mitral valve leaflet with moderate MR, EF of 55% no regional wall motion abnormality left ventricular function cannot be evaluated. ? ?Assessment/Plan:  ? ?Acute respiratory failure with hypoxia (HCC) possibly due to atypical pneumonia versus pulmonary fibrosis ?Has been weaned to room air satting greater 94%. ?Has remained afebrile. ?Transition steroids Omnicef and doxycycline to oral. ?Has remained afebrile leukocytosis is improved. ? ?Acute kidney injury: ?With a baseline creatinine of 0.7 back in November 2019. ?Creatinine is now 1.6 is probably her new baseline due to noncompliant with her hypertensive medication. ? ?Chronic atrial fibrillation ([place]): ?Continue metoprolol and Eliquis. ? ?Essential hypertension: ?Resume Lasix. ?Blood pressure is elevated. ? ?Rheumatoid arthritis: ?She was previously on methotrexate and steroids the patient stopped taking it as she continues to get infections. ?Probably her rheumatoid arthritis is leading to her pulmonary fibrosis we will need to get pulmonary involved to follow-up as an outpatient. ? ?Anemia of chronic disease: ?Questionable iron deficiency started on ferrous sulfate. ? ?Physical debility: ?Occupational Therapy evaluated the patient recommended home health PT. ? ?Severe  morbid obesity: ?Noted ? ? ?DVT prophylaxis: eliquis ?Family Communication:daughter ?Status is: Inpatient ?Remains inpatient appropriate because: Acute respiratory failure with hypoxia likely due to pneumonia. ? ? ? ?Code Status:  ? ?  ?Code Status Orders  ?(From admission, onward)  ?  ? ? ?  ? ?  Start     Ordered  ? 03/11/22 2323  Full code  Continuous       ? 03/11/22 2322  ? ?  ?  ? ?  ? ?Code Status History   ? ? Date Active Date Inactive Code Status Order ID Comments User Context  ? 10/01/2018 1653 10/03/2018 1525 Full Code [id number]  Aline August, MD ED  ? 02/27/2013 1743 03/01/2013 1840 Full Code [id number]  Lahoma Crocker, MD Inpatient  ? ?  ? ? ? ? ?IV Access:  ? ?Peripheral IV ? ? ?Procedures and diagnostic studies:  ? ?No results found. ? ? ?Medical Consultants:  ? ?None. ? ? ?Subjective:  ? ? ?Kathleen Kane continues to cough breathing is better. ? ?Objective:  ? ? ?Vitals:  ? 03/13/22 1316 03/13/22 2015 03/13/22 2028 03/14/22 0604  ?BP: 110/63  139/68 (!) 157/93  ?Pulse: 99  92 93  ?Resp:   19 20  ?Temp: 98.2 ?F (36.8 ?C)  98 ?F (36.7 ?C) 97.8 ?F (36.6 ?C)  ?TempSrc: Oral  Oral   ?SpO2: 95% 94% 94% 95%  ?Weight: 104 kg     ?Height: '5\' 2"'$  (1.575 m)     ? ?SpO2: 95 % ?O2 Flow Rate (L/min): 4 L/min ? ? ?Intake/Output Summary (Last 24 hours) at 03/14/2022 0702 ?Last data filed at 03/14/2022 0600 ?Gross per 24 hour  ?Intake 3076.56 ml  ?Output 500 ml  ?Net 2576.56 ml  ? ? ?  Filed Weights  ? 03/13/22 1316  ?Weight: 104 kg  ? ? ?Exam: ?General exam: In no acute distress. ?Respiratory system: Good air movement and diffuse crackles ?Cardiovascular system: S1 & S2 heard, RRR. No JVD. ?Gastrointestinal system: Abdomen is nondistended, soft and nontender.  ?Extremities: No pedal edema. ?Skin: No rashes, lesions or ulcers ?Psychiatry: Judgement and insight appear normal. Mood & affect appropriate. ? ? ?Data Reviewed:  ? ? ?Labs: ?Basic Metabolic Panel: ?Recent Labs  ?Lab 03/11/22 ?0102 03/11/22 ?2245 03/11/22 ?2250  03/12/22 ?0500 03/13/22 ?0454 03/14/22 ?0453  ?NA 137 134* 133* 133* 135 136  ?K 3.5 4.2 4.2 4.7 4.9 5.4*  ?CL 103 98 97* 98 101 105  ?CO2 20* 24  --  21* 22 22  ?GLUCOSE 218* 260* 254* 286* 277* 317*  ?BUN 18 30* 29* 37* 47* 47*  ?CREATININE 1.06* 1.68* 1.70* 1.61* 1.60* 1.62*  ?CALCIUM 10.0 9.4  --  9.1 9.1 9.0  ?MG  --   --   --  2.1  --   --   ?PHOS  --   --   --  6.7*  --   --   ? ? ?GFR ?Estimated Creatinine Clearance: 40.2 mL/min (A) (by C-G formula based on SCr of 1.62 mg/dL (H)). ?Liver Function Tests: ?Recent Labs  ?Lab 03/11/22 ?7253 03/12/22 ?0500  ?AST 17 14*  ?ALT 21 17  ?ALKPHOS 68 61  ?BILITOT 1.2 1.0  ?PROT 7.1 7.1  ?ALBUMIN 3.8 3.6  ? ? ?Recent Labs  ?Lab 03/11/22 ?6644  ?LIPASE 33  ? ? ?No results for input(s): AMMONIA in the last 168 hours. ?Coagulation profile ?No results for input(s): INR, PROTIME in the last 168 hours. ?COVID-19 Labs ? ?Recent Labs  ?  03/12/22 ?1000  ?FERRITIN 234  ? ? ? ?Lab Results  ?Component Value Date  ? [place] NEGATIVE 03/12/2022  ? ? ?CBC: ?Recent Labs  ?Lab 03/09/22 ?1735 03/11/22 ?0347 03/11/22 ?2245 03/11/22 ?2250 03/12/22 ?0500  ?WBC 11.4* 10.7* 9.4  --  8.4  ?NEUTROABS  --  7.8* 7.2  --  7.8*  ?HGB 10.3* 11.6* 10.6* 11.6* 10.9*  ?HCT 32.8* 36.6 34.7* 34.0* 33.9*  ?MCV 85.4 84.5 84.8  --  85.0  ?PLT 309 306 297  --  298  ? ? ?Cardiac Enzymes: ?No results for input(s): CKTOTAL, CKMB, CKMBINDEX, TROPONINI in the last 168 hours. ?BNP (last 3 results) ?No results for input(s): PROBNP in the last 8760 hours. ?CBG: ?Recent Labs  ?Lab 03/12/22 ?2252 03/13/22 ?0808 03/13/22 ?1202 03/13/22 ?1651 03/13/22 ?2025  ?GLUCAP 232* 273* 274* 189* 284*  ? ? ?D-Dimer: ?No results for input(s): DDIMER in the last 72 hours. ?Hgb A1c: ?Recent Labs  ?  03/12/22 ?1000  ?HGBA1C 7.4*  ? ? ?Lipid Profile: ?No results for input(s): CHOL, HDL, LDLCALC, TRIG, CHOLHDL, LDLDIRECT in the last 72 hours. ?Thyroid function studies: ?No results for input(s): TSH, T4TOTAL, T3FREE, THYROIDAB in the  last 72 hours. ? ?Invalid input(s): FREET3 ?Anemia work up: ?Recent Labs  ?  03/12/22 ?1000  ?VITAMINB12 561  ?FOLATE 37.9  ?FERRITIN 234  ?TIBC 322  ?IRON 33  ?RETICCTPCT 2.6  ? ? ?Sepsis Labs: ?Recent Labs  ?Lab 03/09/22 ?1735 03/11/22 ?4259 03/11/22 ?2245 03/12/22 ?0500 03/12/22 ?1000 03/13/22 ?0454  ?PROCALCITON  --   --   --   --  <0.10 <0.10  ?WBC 11.4* 10.7* 9.4 8.4  --   --   ? ? ?Microbiology ?Recent Results (from the past 240 hour(s))  ?Resp  Panel by RT-PCR (Flu A&B, Covid) Nasopharyngeal Swab     Status: None  ? Collection Time: 03/12/22 12:20 AM  ? Specimen: Nasopharyngeal Swab; Nasopharyngeal(NP) swabs in vial transport medium  ?Result Value Ref Range Status  ? SARS Coronavirus 2 by RT PCR NEGATIVE NEGATIVE Final  ?  Comment: (NOTE) ?SARS-CoV-2 target nucleic acids are NOT DETECTED. ? ?The SARS-CoV-2 RNA is generally detectable in upper respiratory ?specimens during the acute phase of infection. The lowest ?concentration of SARS-CoV-2 viral copies this assay can detect is ?138 copies/mL. A negative result does not preclude SARS-Cov-2 ?infection and should not be used as the sole basis for treatment or ?other patient management decisions. A negative result may occur with  ?improper specimen collection/handling, submission of specimen other ?than nasopharyngeal swab, presence of viral mutation(s) within the ?areas targeted by this assay, and inadequate number of viral ?copies(<138 copies/mL). A negative result must be combined with ?clinical observations, patient history, and epidemiological ?information. The expected result is Negative. ? ?Fact Sheet for Patients:  ?EntrepreneurPulse.com.au ? ?Fact Sheet for Healthcare Providers:  ?IncredibleEmployment.be ? ?This test is no t yet approved or cleared by the [place] FDA and  ?has been authorized for detection and/or diagnosis of SARS-CoV-2 by ?FDA under an Emergency Use Authorization (EUA). This EUA will remain  ?in  effect (meaning this test can be used) for the duration of the ?COVID-19 declaration under Section 564(b)(1) of the Act, 21 ?U.S.C.section 360bbb-3(b)(1), unless the authorization is terminated  ?or

## 2022-03-15 ENCOUNTER — Other Ambulatory Visit: Payer: Self-pay | Admitting: Rheumatology

## 2022-03-15 LAB — BASIC METABOLIC PANEL
Anion gap: 8 (ref 5–15)
BUN: 41 mg/dL — ABNORMAL HIGH (ref 8–23)
CO2: 27 mmol/L (ref 22–32)
Calcium: 9.5 mg/dL (ref 8.9–10.3)
Chloride: 108 mmol/L (ref 98–111)
Creatinine, Ser: 1 mg/dL (ref 0.44–1.00)
GFR, Estimated: >60 mL/min (ref >60)
Glucose, Bld: 119 mg/dL — ABNORMAL HIGH (ref 70–99)
Potassium: 3.7 mmol/L (ref 3.5–5.1)
Sodium: 143 mmol/L (ref 135–145)

## 2022-03-15 LAB — GLUCOSE, CAPILLARY: Glucose-Capillary: 83 mg/dL (ref 70–99)

## 2022-03-15 MED ORDER — PREDNISONE 10 MG PO TABS: ORAL_TABLET | ORAL | 0 refills | Status: DC

## 2022-03-15 MED ORDER — DOXYCYCLINE HYCLATE 100 MG PO TABS: 100.0000 mg | ORAL_TABLET | Freq: Two times a day (BID) | ORAL | 0 refills | Status: DC

## 2022-03-15 MED ORDER — CEFDINIR 300 MG PO CAPS: 300.0000 mg | ORAL_CAPSULE | Freq: Two times a day (BID) | ORAL | 0 refills | Status: DC

## 2022-03-15 MED ORDER — FERROUS SULFATE 325 (65 FE) MG PO TABS
325.0000 mg | ORAL_TABLET | Freq: Every day | ORAL | 3 refills | Status: AC
Start: 2022-03-15 — End: ?

## 2022-03-15 NOTE — TOC Transition Note (Signed)
Transition of Care (TOC) - CM/SW Discharge Note ? ? ?Patient Details  ?Name: Kathleen Kane ?MRN: [id number] ?Date of Birth: [birth year] ? ?Transition of Care [facility]) CM/SW Contact:  ?Leeroy Cha, RN ?Phone Number: ?03/15/2022, 8:44 AM ? ? ?Clinical Narrative:    ?D/c to home no toc needs present.  Home with self care. ? ? ?Final next level of care: Home/Self Care ?  ? ? ?Patient Goals and CMS Choice ?Patient states their goals for this hospitalization and ongoing recovery are:: to go home ?CMS Medicare.gov Compare Post Acute Care list provided to:: Patient ?Choice offered to / list presented to : Patient ? ?Discharge Placement ?  ?           ?  ?  ?  ?  ? ?Discharge Plan and Services ?  ?Discharge Planning Services: CM Consult ?           ?  ?  ?  ?  ?  ?  ?  ?  ?  ?  ? ?Social Determinants of Health (SDOH) Interventions ?  ? ? ?Readmission Risk Interventions ?   ? View : No data to display.  ?  ?  ?  ? ? ? ? ? ?

## 2022-03-15 NOTE — Plan of Care (Signed)
?  Problem: Clinical Measurements: ?Goal: Cardiovascular complication will be avoided ?Outcome: Adequate for Discharge ?  ?Problem: Clinical Measurements: ?Goal: Respiratory complications will improve ?Outcome: Adequate for Discharge ?  ?Problem: Activity: ?Goal: Risk for activity intolerance will decrease ?Outcome: Adequate for Discharge ?  ?Problem: Skin Integrity: ?Goal: Risk for impaired skin integrity will decrease ?Outcome: Adequate for Discharge ?  ?

## 2022-03-15 NOTE — Discharge Summary (Signed)
Physician Discharge Summary  ?Kathleen Kane NLG:[id number] DOB: 1961-05-17 DOA: 03/11/2022 ? ?PCP: [name], MD ? ?Admit date: 03/11/2022 ?Discharge date: 03/15/2022 ? ?Admitted From: Home ?Disposition:  Home ? ?Recommendations for Outpatient Follow-up:  ?Follow up with Pulmonaryin 1-2 weeks ?Please obtain BMP/CBC in one week ? ? ?Home Health:No ?Equipment/Devices:None ? ?Discharge Condition:Stable ?CODE STATUS:Full ?Diet recommendation: Heart Healthy ? ?Brief/Interim Summary: ?61 y.o. female past medical history significant for pulmonary fibrosis by CT scan in May 2020 history, rheumatoid arthritis for which she is not taking methotrexate or steroids which he was previously on, essential hypertension, diabetes mellitus type 2, permanent atrial fibrillation on Eliquis.  Comes into the ED with gradual shortness of breath, she called EMS upon arrival her saturations were 80 she endorses a persistent cough with light mucus. 2D echo was done that showed possible restricted posterior mitral valve leaflet with moderate MR, EF of 55% no regional wall motion abnormality left ventricular function cannot be evaluated ? ?Discharge Diagnoses:  ?Principal Problem: ?  Acute respiratory failure with hypoxia ([place]) ?Active Problems: ?  Atrial fibrillation ([place]) ?  Chronic diastolic CHF (congestive heart failure) ([place]) ?  Diabetes mellitus ([place]) ?  Essential hypertension ?  Morbid obesity ([place]) ?  Acute on chronic diastolic CHF (congestive heart failure) ([place]) ?  Acute pulmonary edema (HCC) ? ?Acute respiratory failure with hypoxia possibly due to atypical pneumonia possibly superimposed on pulmonary fibrosis in the setting of a history of rheumatoid arthritis: ?She was started on steroids as she was wheezing and empiric antibiotics. ?On admission she was on 4 L of oxygen, we were able to wean her down to room air. ?Her antibiotics were changed to p.o. and she will go home on steroid taper. ?Going back through her chart at it seems  like though she has had multiple images that showed bilateral pulmonary infiltrates since November or October of last year which makes me concerned for pulmonary fibrosis. ?She will follow-up with pulmonary in 4 weeks to reevaluate. ? ?Acute kidney injury: ?Back in November 2019 creatinine was 0.7, now is 1.6 remain stable at that level throughout her hospital stay she has been noncompliant with her hypertensive medication. ?She will follow-up with her primary care doctor as an outpatient. ?Her blood pressure remained relatively controlled throughout her hospital stay. ? ?Chronic atrial fibrillation: ?Continue metoprolol and Eliquis. ? ?Central hypertension: ?No changes made to her medication continue current regimen. ? ?Rheumatoid arthritis: ?She was previously on methotrexate and steroids which she stopped taking as she continues to get infections. ?She will need to follow-up with her rheumatologist as an outpatient. ? ?Anemia of chronic disease: ?Likely iron deficiency she was started ferrous sulfate. ? ?Physical debility: ?Physical therapy and Occupational Therapy were consulted she will be sent home with home health. ? ?Severe morbid obesity: ?Counseling. ?Discharge Instructions ? ?Discharge Instructions   ? ? Diet - low sodium heart healthy   Complete by: As directed ?  ? Increase activity slowly   Complete by: As directed ?  ? ?  ? ?Allergies as of 03/15/2022   ? ?   Reactions  ? Ace Inhibitors Cough  ? Lisinopril Cough  ? ?  ? ?  ?Medication List  ?  ? ?TAKE these medications   ? ?Accu-Chek FastClix Lancets Misc ?Apply topically. ?  ?Accu-Chek Guide test strip ?Generic drug: glucose blood ?  ?atorvastatin 20 MG tablet ?Commonly known as: LIPITOR ?TAKE 1 TABLET (20 MG TOTAL) BY MOUTH DAILY. PLEASE SCHEDULE YEARLY APPOINTMENT FOR FUTURE  REFILLS. 1ST ATTEMPT. [place] ?What changed: when to take this ?  ?B-D ULTRAFINE III SHORT PEN 31G X 8 MM Misc ?Generic drug: Insulin Pen Needle ?Inject into the skin 2 (two)  times daily. ?  ?benzonatate 100 MG capsule ?Commonly known as: TESSALON ?Take 1 capsule (100 mg total) by mouth 3 (three) times daily as needed for cough. ?  ?blood glucose meter kit and supplies ?Dispense based on patient and insurance preference. Use up to four times daily as directed. (FOR ICD-10 E10.9, E11.9). ?  ?cefdinir 300 MG capsule ?Commonly known as: OMNICEF ?Take 1 capsule (300 mg total) by mouth every 12 (twelve) hours. ?  ?dicyclomine 20 MG tablet ?Commonly known as: BENTYL ?Take 1 tablet (20 mg total) by mouth 2 (two) times daily. ?  ?doxycycline 100 MG tablet ?Commonly known as: VIBRA-TABS ?Take 1 tablet (100 mg total) by mouth every 12 (twelve) hours. ?  ?Eliquis 5 MG Tabs tablet ?Generic drug: apixaban ?TAKE 1 TABLET BY MOUTH TWICE A DAY ?What changed: how much to take ?  ?ferrous sulfate 325 (65 FE) MG tablet ?Take 1 tablet (325 mg total) by mouth daily with breakfast. ?  ?folic acid 1 MG tablet ?Commonly known as: FOLVITE ?Take 1 mg by mouth every morning. ?  ?furosemide 40 MG tablet ?Commonly known as: LASIX ?TAKE 1 TABLET BY MOUTH TWICE A DAY ?What changed: when to take this ?  ?hydrOXYzine 25 MG tablet ?Commonly known as: ATARAX ?Take 0.5-1 tablets (12.5-25 mg total) by mouth every 8 (eight) hours as needed for itching. ?  ?losartan 25 MG tablet ?Commonly known as: COZAAR ?TAKE 1 TABLET BY MOUTH EVERY DAY ?What changed: when to take this ?  ?metFORMIN 500 MG tablet ?Commonly known as: GLUCOPHAGE ?Take 2 tablets (1,000 mg total) by mouth 2 (two) times daily. ?  ?metoprolol tartrate 25 MG tablet ?Commonly known as: LOPRESSOR ?Take 0.5 tablets (12.5 mg total) by mouth 2 (two) times daily. ?  ?NovoLOG Mix 70/30 FlexPen (70-30) 100 UNIT/ML FlexPen ?Generic drug: insulin aspart protamine - aspart ?Inject 0.4 mLs (40 Units total) into the skin 2 (two) times daily with a meal. ?What changed:  ?how much to take ?when to take this ?additional instructions ?  ?Orencia ClickJect 213 MG/ML Soaj ?Generic  drug: Abatacept ?INJECT THE CONTENTS OF 1 PEN INTO THE SKIN EVERY 7 DAYS. ?  ?predniSONE 10 MG tablet ?Commonly known as: DELTASONE ?Takes 6 tablets for 1 days, then 5 tablets for 1 days, then 4 tablets for 1 days, then 3 tablets for 1 days, then 2 tabs for 1 days, then 1 tab for 1 days, and then stop. ?  ?Rasuvo 17.5 MG/0.35ML Soaj ?Generic drug: Methotrexate (PF) ?Inject  1 pen (17.5 mg) into the skin once a week. ?  ?VITAMIN B-12 PO ?Take 1 tablet by mouth daily. ?  ? ?  ? ? ?Allergies  ?Allergen Reactions  ? Ace Inhibitors Cough  ? Lisinopril Cough  ? ? ?Consultations: ?None ? ? ?Procedures/Studies: ?DG Chest 2 View ? ?Result Date: 03/09/2022 ?CLINICAL DATA:  Hemoptysis since yesterday, short of breath, chest tightness EXAM: CHEST - 2 VIEW COMPARISON:  01/11/2022 FINDINGS: Frontal and lateral views of the chest demonstrate enlarged cardiac silhouette. Since the prior exam, there is interval increase in the diffuse interstitial and ground-glass opacity seen previously. No effusion or pneumothorax. No acute bony abnormalities. IMPRESSION: 1. Constellation of findings favoring worsening volume status and congestive heart failure. Underlying atypical infection or pulmonary hemorrhage cannot be  excluded. Electronically Signed   By: Randa Ngo M.D.   On: 03/09/2022 18:19  ? ?CT Chest W Contrast ? ?Result Date: 03/09/2022 ?CLINICAL DATA:  Hemoptysis. EXAM: CT CHEST WITH CONTRAST TECHNIQUE: Multidetector CT imaging of the chest was performed during intravenous contrast administration. RADIATION DOSE REDUCTION: This exam was performed according to the departmental dose-optimization program which includes automated exposure control, adjustment of the mA and/or kV according to patient size and/or use of iterative reconstruction technique. CONTRAST:  16m OMNIPAQUE IOHEXOL 300 MG/ML  SOLN COMPARISON:  October 01, 2018 FINDINGS: Cardiovascular: There is moderate to marked severity calcification of the thoracic aorta,  without evidence of aortic aneurysm or dissection. There is mild cardiomegaly with marked severity coronary artery calcification. No pericardial effusion. Mediastinum/Nodes: There is mild pretracheal, AP windo

## 2022-04-04 NOTE — Progress Notes (Unsigned)
Cardiology Office Note:    Date:  04/06/2022   ID:  Kathleen Kane, Kathleen Kane 09-20-1961, MRN [id number]  PCP:  [name], MD   [facility] Providers Cardiologist:  Jenkins Rouge, MD     Referring MD: [name], MD   Chief Complaint: Hospital follow-up CHF  History of Present Illness:    Kathleen Kane is a very pleasant 61 y.o. female with a hx of persistent atrial fibrillation on chronic anticoagulation, chronic HFpEF, OSA, hypertension, type 2 diabetes, RA, obesity, pulmonary fibrosis, and prior tobacco abuse.  Normal Myoview 2014. Atherosclerosis noted on CT scan during hospitalization for pneumonia in 2019. 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  Carotids revealed 1 to 39% stenosis 02/15/2020.   She was last seen in our office on 12/09/2020 by Dr. Johnsie Cancel at which time she was advised to return in 1 year.   She called our office 08/14/2021 with concerns for chest pain and shortness of breath.  She went to [facility] and was diagnosed with pneumonia.  Most recent echocardiogram 10/22/21, following CXR that showed cardiomegaly and mild interstitial edema, revealed LVEF 55 to 60%, mild LVH, unable to evaluate diastolic function, moderate MR with recommendation from Dr. Johnsie Cancel to repeat in 1 year.   Admission 5/4-03/15/22.  Presented with shortness of breath and coughing, was treated a few days prior at [facility] for URI.  Was in the ED a few days prior with abdominal pain.  She took home Lasix and felt some improvement in symptoms of SOB. Oxygen saturations were initially reported 80%.  EKG revealed atrial fibrillation at 89 bpm.  CXR suggestive of CHF, atypical infection or pulmonary hemorrhage could not be excluded so chest CT was done which revealed extensive pulmonary fibrosis, cardiomegaly, coronary artery calcification.  She was started on antibiotics and steroids.  BP remained relatively controlled throughout hospital stay, she had mild AKI with return of normal creatinine at discharge.    Today, she is here with her daughter for hospital follow-up for CHF.  She is sitting in a wheelchair.  Sleeping in a recliner since hospital d/c because it is more comfortable.  Is able to lie flat just prefers to sleep in the recliner. Has noted palpitations on 2 occasions since d/c - feels like heart racing and on one occasion felt a big "plop."  Her daughter has been making her follow a low-sodium diet and states leg edema has greatly improved since being home.  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 She denies chest pain, lower extremity edema, fatigue, palpitations, melena, hematuria, hemoptysis, diaphoresis, weakness, presyncope, syncope, orthopnea, and PND.   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 AHI-6.8/hr, during REM-27.3/hr. RDI-25.0/hr, during REM-40.9/hr. avg o2 sat during REM and NREM 95%   OSA (obstructive sleep apnea) 02/10/2017   Pelvic mass in female 01/29/2013  With ascites    Pneumonia 10/02/2018   Rheumatoid arthritis ([place])    Rheumatoid arthritis, 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[place]) 10/08/2015    Past Surgical History:  Procedure Laterality Date   CARDIOVASCULAR STRESS TEST  02/16/2013   no significant EKG changes with Lexiscan, normal LV function and normal wall function   CESAREAN SECTION     x2   DOPPLER ECHOCARDIOGRAPHY  01/24/2008   EF 55-60%, no  diagnostic evidence of LV wall motion abnormalities, LV wall thickness was mild-moderately increased.   HAND SURGERY Right 2018   LAPAROTOMY Right 02/27/2013   Procedure: EXPLORATORY LAPAROTOMY, right salpingo-oopherectomy;  Surgeon: [name], MD;  Location: [facility];  Service: Gynecology;  Laterality: Right;   left breast cyst     SALPINGOOPHORECTOMY Right 02/27/2013   Procedure: SALPINGO OOPHORECTOMY;  Surgeon: [name], MD;  Location: [facility];  Service: Gynecology;  Laterality: Right;   TUBAL LIGATION Bilateral 1989    Current Medications: Current Meds  Medication Sig   Accu-Chek FastClix Lancets MISC Apply topically.   ACCU-CHEK GUIDE test strip    apixaban (ELIQUIS) 5 MG TABS tablet TAKE 1 TABLET BY MOUTH TWICE A DAY   atorvastatin (LIPITOR) 20 MG tablet TAKE 1 TABLET (20 MG TOTAL) BY MOUTH DAILY. PLEASE SCHEDULE YEARLY APPOINTMENT FOR FUTURE REFILLS. 1ST ATTEMPT. THANK YOU   B-D ULTRAFINE III SHORT PEN 31G X 8 MM MISC Inject into the skin 2 (two) times daily.   blood glucose meter kit and supplies Dispense based on patient and insurance preference. Use up to four times daily as directed. (FOR ICD-10 E10.9, E11.9).   dicyclomine (BENTYL) 20 MG tablet Take 1 tablet (20 mg total) by mouth 2 (two) times daily.   ferrous sulfate 325 (65 FE) MG tablet Take 1 tablet (325 mg total) by mouth daily with breakfast.   folic acid (FOLVITE) 1 MG tablet Take 1 mg by mouth every morning.   furosemide (LASIX) 40 MG tablet TAKE 1 TABLET BY MOUTH TWICE A DAY   hydrOXYzine (ATARAX) 25 MG tablet Take 0.5-1 tablets (12.5-25 mg total) by mouth every 8 (eight) hours as needed for itching.   losartan (COZAAR) 25 MG tablet TAKE 1 TABLET BY MOUTH EVERY DAY   metFORMIN (GLUCOPHAGE) 500 MG tablet Take 2 tablets (1,000 mg total) by mouth 2 (two) times daily.   metoprolol tartrate (LOPRESSOR) 25 MG tablet Take 0.5 tablets (12.5 mg total) by mouth 2 (two) times daily.   NOVOLOG MIX 70/30 FLEXPEN (70-30)  100 UNIT/ML FlexPen Inject 0.4 mLs (40 Units total) into the skin 2 (two) times daily with a meal. (Patient taking differently: Inject 40-80 Units into the skin See admin instructions. Inject 40-80 units subcutaneously once or twice daily with meals - dose is based on CBG and carbs)   ORENCIA CLICKJECT 947 MG/ML SOAJ INJECT THE CONTENTS OF 1 PEN INTO THE SKIN EVERY 7 DAYS.   predniSONE (DELTASONE) 10 MG tablet Takes 6 tablets for 1 days, then 5 tablets for 1 days, then 4 tablets for 1 days, then 3 tablets for 1 days, then 2 tabs for 1 days, then 1 tab for 1 days, and then stop. (Patient taking differently: Take 10 mg by mouth daily.)     Allergies:   Ace inhibitors and Lisinopril   Social History   Socioeconomic History   Marital status: Married    Spouse name: Umeda,Theodore   Number of children: 2   Years of education: Not on file   Highest education level: Not on file  Occupational History   Not on file  Tobacco Use   Smoking status: Former    Packs/day: 0.75    Years: 24.00    Pack years: 18.00    Types: Cigarettes    Quit date: 06/08/2014    Years since quitting: 7.8    Passive exposure: Never   Smokeless tobacco: Never   Tobacco comments:    smoking cessation info given.   Vaping Use   Vaping Use: Former  Substance and Sexual Activity   Alcohol use: No    Alcohol/week: 0.0 standard drinks   Drug use: No   Sexual activity: Yes    Partners: Male  Other Topics Concern   Not on file  Social History Narrative   Not on file   Social Determinants of Health   Financial Resource Strain: Not on file  Food Insecurity: Not on file  Transportation Needs: Not on file  Physical Activity: Not on file  Stress: Not on file  Social Connections: Not on file     Family History: The patient's family history includes Autoimmune disease in her daughter; Diabetes in her brother, mother, paternal grandmother, and sister; Healthy in her son; Heart Problems in her sister; Hypertension  in her brother, father, and mother; Kidney failure in her sister; Throat cancer in her mother.  ROS:   Please see the history of present illness.  All other systems reviewed and are negative.  Labs/Other Studies Reviewed:    The following studies were reviewed today:  Echo 10/22/21  1. Possible restricted posterior MV leaflet; moderate MR; moderate TR.   2. Left ventricular ejection fraction, by estimation, is 55 to 60%. The  left ventricle has normal function. The left ventricle has no regional  wall motion abnormalities. The left ventricular internal cavity size was  mildly dilated. There is mild left  ventricular hypertrophy. Left ventricular diastolic function could not be  evaluated.   3. Right ventricular systolic function is normal. The right ventricular  size is mildly enlarged. There is moderately elevated pulmonary artery  systolic pressure.   4. Left atrial size was mildly dilated.   5. Right atrial size was mildly dilated.   6. The mitral valve is normal in structure. Moderate mitral valve  regurgitation. No evidence of mitral stenosis.   7. Tricuspid valve regurgitation is moderate.   8. The aortic valve is tricuspid. Aortic valve regurgitation is not  visualized. No aortic stenosis is present.   9. The inferior vena cava is dilated in size with >50% respiratory  variability, suggesting right atrial pressure of 8 mmHg.   Comparison(s): 02/26/20 EF 55-60%. PA pressure 25mHg.   VAS Carotid U/S 02/16/20  Right Carotid: Velocities in the right ICA are consistent with a 1-39%  stenosis.                The RICA velocities remain within normal range and stable                 compared to the prior exam.   Left Carotid: Velocities in the left ICA are consistent with a 1-39%  stenosis.               Non-hemodynamically significant plaque <50% noted in the  CCA. The                LICA velocities remain within normal range and have  increased               compared to  the  prior exam.   Vertebrals:  Bilateral vertebral arteries demonstrate antegrade flow.  Subclavians: Left subclavian artery flow was disturbed. Normal flow  hemodynamics              were seen in the right subclavian artery.    Myoview 02/16/13  Exercise Capacity:  Lexiscan with no exercise. BP Response:  Normal blood pressure response. Clinical Symptoms:  No significant symptoms noted. ECG Impression:  No significant ECG changes with Lexiscan. LV Wall Motion:  NL LV Function; NL Wall Motion Comparison with Prior Nuclear Study: No significant change from previous study   Overall Impression:  Normal stress nuclear study.  Low risk stress nuclear study.    Recent Labs: 03/11/2022: B Natriuretic Peptide 127.8 03/12/2022: ALT 17; Hemoglobin 10.9; Magnesium 2.1; Platelets 298 03/15/2022: BUN 41; Creatinine, Ser 1.00; Potassium 3.7; Sodium 143  Recent Lipid Panel    Component Value Date/Time   CHOL 108 03/09/2021 1602   CHOL 100 07/21/2020 1005   TRIG 81 03/09/2021 1602   HDL 38 (L) 03/09/2021 1602   HDL 29 (L) 07/21/2020 1005   CHOLHDL 2.8 03/09/2021 1602   VLDL 28 01/24/2008 0330   LDLCALC 54 03/09/2021 1602     Risk Assessment/Calculations:    CHA2DS2-VASc Score = 5   :1} This indicates a 7.2% annual risk of stroke. The patient's score is based upon: CHF History: 1 HTN History: 1 Diabetes History: 1 Stroke History: 0 Vascular Disease History: 1 Age Score: 0 Gender Score: 1   Physical Exam:    VS:  BP 120/70 (BP Location: Left Arm, Patient Position: Sitting, Cuff Size: Normal)   Pulse 95   Ht '5\' 2"'  (1.575 m)   Wt 226 lb (102.5 kg)   LMP 10/28/2013   SpO2 97%   BMI 41.34 kg/m     Wt Readings from Last 3 Encounters:  04/06/22 226 lb (102.5 kg)  03/15/22 240 lb 4.8 oz (109 kg)  03/11/22 229 lb (103.9 kg)     GEN: Well developed, obese female in no acute distress HEENT: Normal NECK: No JVD; No carotid bruits CARDIAC: IRR, no murmurs, rubs,  gallops RESPIRATORY:  Diminished breath sounds bilaterally  ABDOMEN: Soft, non-tender, non-distended MUSCULOSKELETAL:  No edema; No deformity. 2+ pedal pulses, equal bilaterally SKIN: Warm and dry NEUROLOGIC:  Alert and oriented x 3 PSYCHIATRIC:  Normal affect   EKG:  EKG is not ordered today.   Diagnoses:    1. Mitral valve insufficiency, unspecified etiology   2. Longstanding persistent atrial fibrillation (HCC)   3. Persistent atrial fibrillation ([place])   4. Hyperlipidemia, unspecified hyperlipidemia type   5. Acute on chronic heart failure with preserved ejection fraction (HFpEF) (HCC)    Assessment and Plan:     Acute on chronic HFpEF: Recent hospitalization for CHF. Feeling better since hospital discharge. Does not appear volume overloaded today on exam. Activity is limited by arthritis. Denies dyspnea, edema, orthopnea, PND.  Is taking Lasix 40 mg daily instead of twice daily due to concern for kidney injury. I have advised her to monitor weight on a daily basis and to take an additional 40 mg of Lasix if weight increases > 2 LB in 24 hours or > 5 LB in a week.  She is restricting fluids to 2 L daily. Continue low-sodium diet.  We will get echocardiogram for evaluation of heart function.  We will recheck BMET today.  If creatinine is stable, would favor initiation of Entresto in the place of losartan.  Will  need to discuss if cost prohibitive. Continue Lasix, metoprolol.   Persistent AF on chronic anticoagulation: HR is well controlled. Few palpitations but otherwise asymptomatic.  No bleeding problems on Eliquis.  Moderate MR: Moderate mitral valve regurgitation by echo 10/19/2021.  We will repeat echocardiogram for evaluation of valve function.  Hypertension: BP is well controlled today.  She is monitoring at home and reports similar readings. Has reduced sodium in diet. Encouraged her to move for as long as possible several times per day to reduce time that she is sedentary.    Aortic atherosclerosis/Hyperlipidemia LDL goal < 70: LDL 54 on 03/09/21.  We will recheck at next visit.  Continue atorvastatin     Disposition: 2 months with APP  Medication Adjustments/Labs and Tests Ordered: Current medicines are reviewed at length with the patient today.  Concerns regarding medicines are outlined above.  Orders Placed This Encounter  Procedures   Basic Metabolic Panel (BMET)   ECHOCARDIOGRAM COMPLETE   No orders of the defined types were placed in this encounter.   Patient Instructions  Medication Instructions:   Your physician recommends that you continue on your current medications as directed. Please refer to the Current Medication list given to you today.  *If you need a refill on your cardiac medications before your next appointment, please call your pharmacy*   Lab Work:  TODAY!!!! BMET   If you have labs (blood work) drawn today and your tests are completely normal, you will receive your results only by: [place] (if you have MyChart) OR A paper copy in the mail If you have any lab test that is abnormal or we need to change your treatment, we will call you to review the results.   Testing/Procedures:  Your physician has requested that you have an echocardiogram. Echocardiography is a painless test that uses sound waves to create images of your heart. It provides your doctor with information about the size and shape of your heart and how well your heart's chambers and valves are working. This procedure takes approximately one hour. There are no restrictions for this procedure.    Follow-Up: At [facility], you and your health needs are our priority.  As part of our continuing mission to provide you with exceptional heart care, we have created designated Provider Care Teams.  These Care Teams include your primary Cardiologist (physician) and Advanced Practice Providers (APPs -  Physician Assistants and Nurse Practitioners) who all work  together to provide you with the care you need, when you need it.  We recommend signing up for the patient portal called "MyChart".  Sign up information is provided on this After Visit Summary.  MyChart is used to connect with patients for Virtual Visits (Telemedicine).  Patients are able to view lab/test results, encounter notes, upcoming appointments, etc.  Non-urgent messages can be sent to your provider as well.   To learn more about what you can do with MyChart, go to [email or web address].    Your next appointment:   2 month(s)  The format for your next appointment:   In Person  Provider:   Christen Bame, NP     Then, Jenkins Rouge, MD will plan to see you again in 5 month(s).    Other Instructions  Heart Failure Education: Weigh yourself EVERY morning after you go to the bathroom but before you eat or drink anything. Write this number down in a weight log/diary. If you gain 3 pounds overnight or 5 pounds in a week, call the  office. Take your medicines as prescribed. If you have concerns about your medications, please call us before you stop taking them.  Eat low salt foods--Limit salt (sodium) to 2000 mg per day. This will help prevent your body from holding onto fluid. Read food labels as many processed foods have a lot of sodium, especially canned goods and prepackaged meats. If you would like some assistance choosing low sodium foods, we would be happy to set you up with a nutritionist. Stay as active as you can everyday. Staying active will give you more energy and make your muscles stronger. Start with 5 minutes at a time and work your way up to 30 minutes a day. Break up your activities--do some in the morning and some in the afternoon. Start with 3 days per week and work your way up to 5 days as you can.  If you have chest pain, feel short of breath, dizzy, or lightheaded, STOP. If you don't feel better after a short rest, call 911. If you do feel better, call the office to let  us know you have symptoms with exercise. Limit all fluids for the day to less than 2 liters. Fluid includes all drinks, coffee, juice, ice chips, soup, jello, and all other liquids.  DASH Eating Plan DASH stands for Dietary Approaches to Stop Hypertension. The DASH eating plan is a healthy eating plan that has been shown to: Reduce high blood pressure (hypertension). Reduce your risk for type 2 diabetes, heart disease, and stroke. Help with weight loss. What are tips for following this plan? Reading food labels Check food labels for the amount of salt (sodium) per serving. Choose foods with less than 5 percent of the Daily Value of sodium. Generally, foods with less than 300 milligrams (mg) of sodium per serving fit into this eating plan. To find whole grains, look for the word "whole" as the first word in the ingredient list. Shopping Buy products labeled as "low-sodium" or "no salt added." Buy fresh foods. Avoid canned foods and pre-made or frozen meals. Cooking Avoid adding salt when cooking. Use salt-free seasonings or herbs instead of table salt or sea salt. Check with your health care provider or pharmacist before using salt substitutes. Do not fry foods. Cook foods using healthy methods such as baking, boiling, grilling, roasting, and broiling instead. Cook with heart-healthy oils, such as olive, canola, avocado, soybean, or sunflower oil. Meal planning  Eat a balanced diet that includes: 4 or more servings of fruits and 4 or more servings of vegetables each day. Try to fill one-half of your plate with fruits and vegetables. 6-8 servings of whole grains each day. Less than 6 oz (170 g) of lean meat, poultry, or fish each day. A 3-oz (85-g) serving of meat is about the same size as a deck of cards. One egg equals 1 oz (28 g). 2-3 servings of low-fat dairy each day. One serving is 1 cup (237 mL). 1 serving of nuts, seeds, or beans 5 times each week. 2-3 servings of heart-healthy fats.  Healthy fats called omega-3 fatty acids are found in foods such as walnuts, flaxseeds, fortified milks, and eggs. These fats are also found in cold-water fish, such as sardines, salmon, and mackerel. Limit how much you eat of: Canned or prepackaged foods. Food that is high in trans fat, such as some fried foods. Food that is high in saturated fat, such as fatty meat. Desserts and other sweets, sugary drinks, and other foods with added sugar. Full-fat  dairy products. Do not salt foods before eating. Do not eat more than 4 egg yolks a week. Try to eat at least 2 vegetarian meals a week. Eat more home-cooked food and less restaurant, buffet, and fast food. Lifestyle When eating at a restaurant, ask that your food be prepared with less salt or no salt, if possible. If you drink alcohol: Limit how much you use to: 0-1 drink a day for women who are not pregnant. 0-2 drinks a day for men. Be aware of how much alcohol is in your drink. In the [place] one drink equals one 12 oz bottle of beer (355 mL), one 5 oz glass of wine (148 mL), or one 1 oz glass of hard liquor (44 mL). General information Avoid eating more than 2,300 mg of salt a day. If you have hypertension, you may need to reduce your sodium intake to 1,500 mg a day. Work with your health care provider to maintain a healthy body weight or to lose weight. Ask what an ideal weight is for you. Get at least 30 minutes of exercise that causes your heart to beat faster (aerobic exercise) most days of the week. Activities may include walking, swimming, or biking. Work with your health care provider or dietitian to adjust your eating plan to your individual calorie needs. What foods should I eat? Fruits All fresh, dried, or frozen fruit. Canned fruit in natural juice (without added sugar). Vegetables Fresh or frozen vegetables (raw, steamed, roasted, or grilled). Low-sodium or reduced-sodium tomato and vegetable juice. Low-sodium or reduced-sodium  tomato sauce and tomato paste. Low-sodium or reduced-sodium canned vegetables. Grains Whole-grain or whole-wheat bread. Whole-grain or whole-wheat pasta. Brown rice. [place]. Bulgur. Whole-grain and low-sodium cereals. Pita bread. Low-fat, low-sodium crackers. Whole-wheat flour tortillas. Meats and other proteins Skinless chicken or [place]. Ground chicken or [place]. Pork with fat trimmed off. Fish and seafood. Egg whites. Dried beans, peas, or lentils. Unsalted nuts, nut butters, and seeds. Unsalted canned beans. Lean cuts of beef with fat trimmed off. Low-sodium, lean precooked or cured meat, such as sausages or meat loaves. Dairy Low-fat (1%) or fat-free (skim) milk. Reduced-fat, low-fat, or fat-free cheeses. Nonfat, low-sodium ricotta or cottage cheese. Low-fat or nonfat yogurt. Low-fat, low-sodium cheese. Fats and oils Soft margarine without trans fats. Vegetable oil. Reduced-fat, low-fat, or light mayonnaise and salad dressings (reduced-sodium). Canola, safflower, olive, avocado, soybean, and sunflower oils. Avocado. Seasonings and condiments Herbs. Spices. Seasoning mixes without salt. Other foods Unsalted popcorn and pretzels. Fat-free sweets. The items listed above may not be a complete list of foods and beverages you can eat. Contact a dietitian for more information. What foods should I avoid? Fruits Canned fruit in a light or heavy syrup. Fried fruit. Fruit in cream or butter sauce. Vegetables Creamed or fried vegetables. Vegetables in a cheese sauce. Regular canned vegetables (not low-sodium or reduced-sodium). Regular canned tomato sauce and paste (not low-sodium or reduced-sodium). Regular tomato and vegetable juice (not low-sodium or reduced-sodium). Angie Fava. Olives. Grains Baked goods made with fat, such as croissants, muffins, or some breads. Dry pasta or rice meal packs. Meats and other proteins Fatty cuts of meat. Ribs. Fried meat. Berniece Salines. Bologna, salami, and other  precooked or cured meats, such as sausages or meat loaves. Fat from the back of a pig (fatback). Bratwurst. Salted nuts and seeds. Canned beans with added salt. Canned or smoked fish. Whole eggs or egg yolks. Chicken or [place] with skin. Dairy Whole or 2% milk, cream, and half-and-half. Whole or  full-fat cream cheese. Whole-fat or sweetened yogurt. Full-fat cheese. Nondairy creamers. Whipped toppings. Processed cheese and cheese spreads. Fats and oils Butter. Stick margarine. Lard. Shortening. Ghee. Bacon fat. Tropical oils, such as coconut, palm kernel, or palm oil. Seasonings and condiments Onion salt, garlic salt, seasoned salt, table salt, and sea salt. Worcestershire sauce. Tartar sauce. Barbecue sauce. Teriyaki sauce. Soy sauce, including reduced-sodium. Steak sauce. Canned and packaged gravies. Fish sauce. Oyster sauce. Cocktail sauce. Store-bought horseradish. Ketchup. Mustard. Meat flavorings and tenderizers. Bouillon cubes. Hot sauces. Pre-made or packaged marinades. Pre-made or packaged taco seasonings. Relishes. Regular salad dressings. Other foods Salted popcorn and pretzels. The items listed above may not be a complete list of foods and beverages you should avoid. Contact a dietitian for more information. Where to find more information National Heart, Lung, and Blood Institute: https://wilson-eaton.com/ American Heart Association: www.heart.org Academy of Nutrition and Dietetics: www.eatright.[place]: www.kidney.org Summary The DASH eating plan is a healthy eating plan that has been shown to reduce high blood pressure (hypertension). It may also reduce your risk for type 2 diabetes, heart disease, and stroke. When on the DASH eating plan, aim to eat more fresh fruits and vegetables, whole grains, lean proteins, low-fat dairy, and heart-healthy fats. With the DASH eating plan, you should limit salt (sodium) intake to 2,300 mg a day. If you have hypertension, you may need  to reduce your sodium intake to 1,500 mg a day. Work with your health care provider or dietitian to adjust your eating plan to your individual calorie needs. This information is not intended to replace advice given to you by your health care provider. Make sure you discuss any questions you have with your health care provider. Document Revised: 09/28/2019 Document Reviewed: 09/28/2019 Elsevier Patient Education  [place]         Signed, Emmaline Life, NP  04/06/2022 5:58 PM    [facility]

## 2022-04-06 ENCOUNTER — Encounter: Payer: Self-pay | Admitting: Nurse Practitioner

## 2022-04-06 ENCOUNTER — Ambulatory Visit (INDEPENDENT_AMBULATORY_CARE_PROVIDER_SITE_OTHER): Payer: Medicaid Other | Admitting: Nurse Practitioner

## 2022-04-06 VITALS — BP 120/70 | HR 95 | Ht 62.0 in | Wt 226.0 lb

## 2022-04-06 DIAGNOSIS — E785 Hyperlipidemia, unspecified: Secondary | ICD-10-CM | POA: Diagnosis not present

## 2022-04-06 DIAGNOSIS — I340 Nonrheumatic mitral (valve) insufficiency: Secondary | ICD-10-CM

## 2022-04-06 DIAGNOSIS — I4811 Longstanding persistent atrial fibrillation: Secondary | ICD-10-CM | POA: Diagnosis not present

## 2022-04-06 DIAGNOSIS — I4819 Other persistent atrial fibrillation: Secondary | ICD-10-CM | POA: Diagnosis not present

## 2022-04-06 DIAGNOSIS — I5033 Acute on chronic diastolic (congestive) heart failure: Secondary | ICD-10-CM

## 2022-04-06 NOTE — Patient Instructions (Signed)
Medication Instructions:   Your physician recommends that you continue on your current medications as directed. Please refer to the Current Medication list given to you today.  *If you need a refill on your cardiac medications before your next appointment, please call your pharmacy*   Lab Work:  TODAY!!!! BMET   If you have labs (blood work) drawn today and your tests are completely normal, you will receive your results only by: [place] (if you have MyChart) OR A paper copy in the mail If you have any lab test that is abnormal or we need to change your treatment, we will call you to review the results.   Testing/Procedures:  Your physician has requested that you have an echocardiogram. Echocardiography is a painless test that uses sound waves to create images of your heart. It provides your doctor with information about the size and shape of your heart and how well your heart's chambers and valves are working. This procedure takes approximately one hour. There are no restrictions for this procedure.    Follow-Up: At [facility], you and your health needs are our priority.  As part of our continuing mission to provide you with exceptional heart care, we have created designated Provider Care Teams.  These Care Teams include your primary Cardiologist (physician) and Advanced Practice Providers (APPs -  Physician Assistants and Nurse Practitioners) who all work together to provide you with the care you need, when you need it.  We recommend signing up for the patient portal called "MyChart".  Sign up information is provided on this After Visit Summary.  MyChart is used to connect with patients for Virtual Visits (Telemedicine).  Patients are able to view lab/test results, encounter notes, upcoming appointments, etc.  Non-urgent messages can be sent to your provider as well.   To learn more about what you can do with MyChart, go to [email or web address].    Your next appointment:    2 month(s)  The format for your next appointment:   In Person  Provider:   Christen Bame, NP     Then, Jenkins Rouge, MD will plan to see you again in 5 month(s).    Other Instructions  Heart Failure Education: Weigh yourself EVERY morning after you go to the bathroom but before you eat or drink anything. Write this number down in a weight log/diary. If you gain 3 pounds overnight or 5 pounds in a week, call the office. Take your medicines as prescribed. If you have concerns about your medications, please call us before you stop taking them.  Eat low salt foods--Limit salt (sodium) to 2000 mg per day. This will help prevent your body from holding onto fluid. Read food labels as many processed foods have a lot of sodium, especially canned goods and prepackaged meats. If you would like some assistance choosing low sodium foods, we would be happy to set you up with a nutritionist. Stay as active as you can everyday. Staying active will give you more energy and make your muscles stronger. Start with 5 minutes at a time and work your way up to 30 minutes a day. Break up your activities--do some in the morning and some in the afternoon. Start with 3 days per week and work your way up to 5 days as you can.  If you have chest pain, feel short of breath, dizzy, or lightheaded, STOP. If you don't feel better after a short rest, call 911. If you do feel better, call the office to let  us know you have symptoms with exercise. Limit all fluids for the day to less than 2 liters. Fluid includes all drinks, coffee, juice, ice chips, soup, jello, and all other liquids.  DASH Eating Plan DASH stands for Dietary Approaches to Stop Hypertension. The DASH eating plan is a healthy eating plan that has been shown to: Reduce high blood pressure (hypertension). Reduce your risk for type 2 diabetes, heart disease, and stroke. Help with weight loss. What are tips for following this plan? Reading food labels Check  food labels for the amount of salt (sodium) per serving. Choose foods with less than 5 percent of the Daily Value of sodium. Generally, foods with less than 300 milligrams (mg) of sodium per serving fit into this eating plan. To find whole grains, look for the word "whole" as the first word in the ingredient list. Shopping Buy products labeled as "low-sodium" or "no salt added." Buy fresh foods. Avoid canned foods and pre-made or frozen meals. Cooking Avoid adding salt when cooking. Use salt-free seasonings or herbs instead of table salt or sea salt. Check with your health care provider or pharmacist before using salt substitutes. Do not fry foods. Cook foods using healthy methods such as baking, boiling, grilling, roasting, and broiling instead. Cook with heart-healthy oils, such as olive, canola, avocado, soybean, or sunflower oil. Meal planning  Eat a balanced diet that includes: 4 or more servings of fruits and 4 or more servings of vegetables each day. Try to fill one-half of your plate with fruits and vegetables. 6-8 servings of whole grains each day. Less than 6 oz (170 g) of lean meat, poultry, or fish each day. A 3-oz (85-g) serving of meat is about the same size as a deck of cards. One egg equals 1 oz (28 g). 2-3 servings of low-fat dairy each day. One serving is 1 cup (237 mL). 1 serving of nuts, seeds, or beans 5 times each week. 2-3 servings of heart-healthy fats. Healthy fats called omega-3 fatty acids are found in foods such as walnuts, flaxseeds, fortified milks, and eggs. These fats are also found in cold-water fish, such as sardines, salmon, and mackerel. Limit how much you eat of: Canned or prepackaged foods. Food that is high in trans fat, such as some fried foods. Food that is high in saturated fat, such as fatty meat. Desserts and other sweets, sugary drinks, and other foods with added sugar. Full-fat dairy products. Do not salt foods before eating. Do not eat more than  4 egg yolks a week. Try to eat at least 2 vegetarian meals a week. Eat more home-cooked food and less restaurant, buffet, and fast food. Lifestyle When eating at a restaurant, ask that your food be prepared with less salt or no salt, if possible. If you drink alcohol: Limit how much you use to: 0-1 drink a day for women who are not pregnant. 0-2 drinks a day for men. Be aware of how much alcohol is in your drink. In the [place] one drink equals one 12 oz bottle of beer (355 mL), one 5 oz glass of wine (148 mL), or one 1 oz glass of hard liquor (44 mL). General information Avoid eating more than 2,300 mg of salt a day. If you have hypertension, you may need to reduce your sodium intake to 1,500 mg a day. Work with your health care provider to maintain a healthy body weight or to lose weight. Ask what an ideal weight is for you. Get at least  30 minutes of exercise that causes your heart to beat faster (aerobic exercise) most days of the week. Activities may include walking, swimming, or biking. Work with your health care provider or dietitian to adjust your eating plan to your individual calorie needs. What foods should I eat? Fruits All fresh, dried, or frozen fruit. Canned fruit in natural juice (without added sugar). Vegetables Fresh or frozen vegetables (raw, steamed, roasted, or grilled). Low-sodium or reduced-sodium tomato and vegetable juice. Low-sodium or reduced-sodium tomato sauce and tomato paste. Low-sodium or reduced-sodium canned vegetables. Grains Whole-grain or whole-wheat bread. Whole-grain or whole-wheat pasta. Brown rice. [place]. Bulgur. Whole-grain and low-sodium cereals. Pita bread. Low-fat, low-sodium crackers. Whole-wheat flour tortillas. Meats and other proteins Skinless chicken or [place]. Ground chicken or [place]. Pork with fat trimmed off. Fish and seafood. Egg whites. Dried beans, peas, or lentils. Unsalted nuts, nut butters, and seeds. Unsalted canned beans.  Lean cuts of beef with fat trimmed off. Low-sodium, lean precooked or cured meat, such as sausages or meat loaves. Dairy Low-fat (1%) or fat-free (skim) milk. Reduced-fat, low-fat, or fat-free cheeses. Nonfat, low-sodium ricotta or cottage cheese. Low-fat or nonfat yogurt. Low-fat, low-sodium cheese. Fats and oils Soft margarine without trans fats. Vegetable oil. Reduced-fat, low-fat, or light mayonnaise and salad dressings (reduced-sodium). Canola, safflower, olive, avocado, soybean, and sunflower oils. Avocado. Seasonings and condiments Herbs. Spices. Seasoning mixes without salt. Other foods Unsalted popcorn and pretzels. Fat-free sweets. The items listed above may not be a complete list of foods and beverages you can eat. Contact a dietitian for more information. What foods should I avoid? Fruits Canned fruit in a light or heavy syrup. Fried fruit. Fruit in cream or butter sauce. Vegetables Creamed or fried vegetables. Vegetables in a cheese sauce. Regular canned vegetables (not low-sodium or reduced-sodium). Regular canned tomato sauce and paste (not low-sodium or reduced-sodium). Regular tomato and vegetable juice (not low-sodium or reduced-sodium). Angie Fava. Olives. Grains Baked goods made with fat, such as croissants, muffins, or some breads. Dry pasta or rice meal packs. Meats and other proteins Fatty cuts of meat. Ribs. Fried meat. Berniece Salines. Bologna, salami, and other precooked or cured meats, such as sausages or meat loaves. Fat from the back of a pig (fatback). Bratwurst. Salted nuts and seeds. Canned beans with added salt. Canned or smoked fish. Whole eggs or egg yolks. Chicken or [place] with skin. Dairy Whole or 2% milk, cream, and half-and-half. Whole or full-fat cream cheese. Whole-fat or sweetened yogurt. Full-fat cheese. Nondairy creamers. Whipped toppings. Processed cheese and cheese spreads. Fats and oils Butter. Stick margarine. Lard. Shortening. Ghee. Bacon fat. Tropical oils,  such as coconut, palm kernel, or palm oil. Seasonings and condiments Onion salt, garlic salt, seasoned salt, table salt, and sea salt. Worcestershire sauce. Tartar sauce. Barbecue sauce. Teriyaki sauce. Soy sauce, including reduced-sodium. Steak sauce. Canned and packaged gravies. Fish sauce. Oyster sauce. Cocktail sauce. Store-bought horseradish. Ketchup. Mustard. Meat flavorings and tenderizers. Bouillon cubes. Hot sauces. Pre-made or packaged marinades. Pre-made or packaged taco seasonings. Relishes. Regular salad dressings. Other foods Salted popcorn and pretzels. The items listed above may not be a complete list of foods and beverages you should avoid. Contact a dietitian for more information. Where to find more information National Heart, Lung, and Blood Institute: https://wilson-eaton.com/ American Heart Association: www.heart.org Academy of Nutrition and Dietetics: www.eatright.[place]: www.kidney.org Summary The DASH eating plan is a healthy eating plan that has been shown to reduce high blood pressure (hypertension). It may also reduce your risk  for type 2 diabetes, heart disease, and stroke. When on the DASH eating plan, aim to eat more fresh fruits and vegetables, whole grains, lean proteins, low-fat dairy, and heart-healthy fats. With the DASH eating plan, you should limit salt (sodium) intake to 2,300 mg a day. If you have hypertension, you may need to reduce your sodium intake to 1,500 mg a day. Work with your health care provider or dietitian to adjust your eating plan to your individual calorie needs. This information is not intended to replace advice given to you by your health care provider. Make sure you discuss any questions you have with your health care provider. Document Revised: 09/28/2019 Document Reviewed: 09/28/2019 Elsevier Patient Education  [place]

## 2022-04-08 ENCOUNTER — Encounter: Payer: Self-pay | Admitting: Internal Medicine

## 2022-04-08 ENCOUNTER — Ambulatory Visit (INDEPENDENT_AMBULATORY_CARE_PROVIDER_SITE_OTHER): Payer: Medicaid Other | Admitting: Internal Medicine

## 2022-04-08 ENCOUNTER — Ambulatory Visit: Payer: Medicaid Other | Admitting: Cardiovascular Disease

## 2022-04-08 ENCOUNTER — Other Ambulatory Visit (INDEPENDENT_AMBULATORY_CARE_PROVIDER_SITE_OTHER): Payer: Medicaid Other

## 2022-04-08 ENCOUNTER — Telehealth: Payer: Self-pay | Admitting: Internal Medicine

## 2022-04-08 VITALS — BP 120/76 | HR 80 | Temp 97.9°F | Ht 62.0 in | Wt 227.0 lb

## 2022-04-08 DIAGNOSIS — I288 Other diseases of pulmonary vessels: Secondary | ICD-10-CM | POA: Diagnosis not present

## 2022-04-08 DIAGNOSIS — M359 Systemic involvement of connective tissue, unspecified: Secondary | ICD-10-CM

## 2022-04-08 DIAGNOSIS — R0609 Other forms of dyspnea: Secondary | ICD-10-CM

## 2022-04-08 DIAGNOSIS — M059 Rheumatoid arthritis with rheumatoid factor, unspecified: Secondary | ICD-10-CM

## 2022-04-08 DIAGNOSIS — J8489 Other specified interstitial pulmonary diseases: Secondary | ICD-10-CM | POA: Diagnosis not present

## 2022-04-08 DIAGNOSIS — Z8669 Personal history of other diseases of the nervous system and sense organs: Secondary | ICD-10-CM

## 2022-04-08 DIAGNOSIS — Z8679 Personal history of other diseases of the circulatory system: Secondary | ICD-10-CM

## 2022-04-08 DIAGNOSIS — I4892 Unspecified atrial flutter: Secondary | ICD-10-CM

## 2022-04-08 DIAGNOSIS — J849 Interstitial pulmonary disease, unspecified: Secondary | ICD-10-CM | POA: Diagnosis not present

## 2022-04-08 LAB — BRAIN NATRIURETIC PEPTIDE: Pro B Natriuretic peptide (BNP): 169 pg/mL — ABNORMAL HIGH (ref 0.0–100.0)

## 2022-04-08 LAB — SEDIMENTATION RATE: Sed Rate: 37 mm/hr — ABNORMAL HIGH (ref 0–30)

## 2022-04-08 NOTE — Telephone Encounter (Signed)
Patient came in to the office for a consult after recent hospitalization. During the OV, patient was walked to make sure her O2 sats maintained stability as when she was first admitted to the hospital, patient required 4L O2 but was able to be discharged without needing any O2.  At rest, patient's pulse was 80. During the first lap of the walk, patient's pulse was all over the place going up to 140s, back down to 114, then up to 134, back down to 121, and then again up to 134.  Asked patient if she had Afib and she said that she did. Stated that she has been really fatigued recently and wonders if it could be due to her Afib acting up.  Patient sat down after the first lap to rest and her pulse went down to 104. After a few minutes of resting, patient was able to walk one more lap. During the second lap, pulse again went all over the place while she was walking and also went as high as 148. This was all with patient walking at a slow pace due to arthritis in knees.  Spoke with Dr. Chase Caller about this as concern is that patient's Afib might be acting up. Routing what happened to both Dr. Chase Caller and patient's cardiologist Dr. Johnsie Cancel.

## 2022-04-08 NOTE — Telephone Encounter (Signed)
Per Dr. Johnsie Cancel, she is in chronic afib Can increase Toprol to full tablet 25 mg daily.  Tried to call patient, no answer and no voicemail.

## 2022-04-08 NOTE — Progress Notes (Signed)
OV 04/08/2022 -rheumatoid arthritis immunosuppressed.  Has interstitial lung disease findings.  Subjective:  Patient ID: Kathleen Kane, female , DOB: 1961/09/05 , age 61 y.o. , MRN: [id number] , ADDRESS: [place]  [id number] PCP [name], MD Patient Care Team: [name], MD as PCP - General (Internal Medicine) Josue Hector, MD as PCP - Cardiology (Cardiology)  This Provider for this visit: Treatment Team:  Attending Provider: Brand Males, MD    04/08/2022 -   Chief Complaint  Patient presents with   Consult    Pt recently had some imaging performed which is the reason for today's visit. States she was also recently in the hospital. Pt does have current complaints of cough which is worse at night.     HPI Kathleen Kane 61 y.o. -history provided by review of the chart talking to her and the daughter.  Obese lady presenting with her daughter.  She has a long history of rheumatoid arthritis diagnosed 8 years ago according to history.  She used to be a patient of Dr. Estanislado Pandy but after having failed different immunosuppression agents [efficacy issues versus side effect such as infections] she has been discharged from follow-up at the rheumatology clinic.  Recommendation was to do prednisone.  Last face-to-face visit was in December 2022 at rheumatolog wth Dr D and with the PA Hazel Sams in March 2023.  Rheumatoid arthritis history: She has RA and osteoarthritis.  Diagnosis seropositive although I could not locate seropositivity in the chart.  Charts indicate that previously she been in a great response to Plaquenil, Humira and Remicade.  Deemed poor candidate for TNF alpha inhibitors due to history of congestive heart failure.  Most recently she was on Orencia and Rasuvo but it appears that she stopped this in October 28, 2011 for "recurrent bouts of pneumonia as well as wound infection of the length of the inguinal region".  In March 2023  she expressed concern about restarting these agents because of concern for side effects.  She expressed interest in doing long-term prednisone to rheumatology in March 2023.  Patient was advised against using long-term prednisone.  She then wanted to see a different rheumatologist.  Referral was made but according to the patient nobody will see her because she has Medicaid.  She has been on chronic steroids according to the chart review since 2015.  But she tells me that she only takes it intermittently although chart says she is wanting to take this and takes it continuously.  She also has osteoarthritis of the knees.  She has idiopathic gout.  She has rheumatoid nodules  Other pertinent past medical history include: OSA unclear if she is taking CPAP, chronic atrial fibrillation sees Dr. Nolon Lennert on Eliquis and metoprolol, chronic diastolic heart failure, type 2 diabetes, vitamin D deficiency.  She has chronic anemia.  Hemoglobin in May 2023 was 10 g% and baseline.   Current: In the back of all this she was admitted 03/11/2022 for 4 days and discharged 03/15/2022 with a history of hemoptysis for [redacted] week along with acute hypoxemic respiratory failure requiring 4 L nasal cannula.  An echocardiogram apparently showed restricted posterior mitral valve leaflet with moderate mitral regurgitation EF 55%.  [Several months ago she seemed to have a high elevated pulmonary pressures on echo].  According to her in the chart she was treated with oxygen therapy antibiotics steroids and diuresis.  She did improve.  She is off oxygen.  She says  she is also off steroids at this point.  She had a CT scan of the chest there was angiogram.  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  Therefore she has been referred here.  Her daughter states that they are aware of ILD changes even a few years ago [2019 high-resolution CT chest showed some groundglass opacities and that the only other CT scan of the chest].  At this point  in time she is back to baseline there is no more hemoptysis she is able to do ADLs.  She does use a wheelchair when she goes shopping out of the doctor's office but she is able to walk around the house.  In fact today she was able to walk 2 out of the 3 laps and stopped because of joint issues and fatigue.  She did not desaturate.   Lab review: Most recent creatinine 1 mg percent 03/15/2022    Simple office walk 185 feet x  3 laps goal with forehead probe 04/08/2022    O2 used ra   Number laps completed 2 of 3 laps   Comments about pace slow   Resting Pulse Ox/HR 100% and 80/min   Final Pulse Ox/HR 98% and 148/min   Desaturated </= 88% no   Desaturated <= 3% points no   Got Tachycardic >/= 90/min Yes, A Fib   Symptoms at end of test Fatigue, pain and dyspnea   Miscellaneous comments Was in  [facility]        has a past medical history of Acute on chronic diastolic CHF (congestive heart failure) ([place]) (08/08/2015), Anxiety, Atrial fibrillation, persistent ([place]), Carpal tunnel syndrome, right (12/15/2016), CHF (congestive heart failure) ([place]) (2013), CHF exacerbation ([place]) (10/01/2018), Depression, Diabetes mellitus ([place]) (08/10/2012), Diabetes mellitus without complication ([place]), Hypertension, 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   reports that she quit smoking about 7 years ago. Her smoking use included cigarettes. She started smoking about 45 years ago. She has a 36.00 pack-year smoking history. She has never been exposed to tobacco smoke. She has never used smokeless tobacco.  Past Surgical History:  Procedure Laterality Date   CARDIOVASCULAR STRESS TEST  02/16/2013   no significant EKG changes with Lexiscan,  normal LV function and normal wall function   CESAREAN SECTION     x2   DOPPLER ECHOCARDIOGRAPHY  01/24/2008   EF 55-60%, no diagnostic evidence of LV wall motion abnormalities, LV wall thickness was mild-moderately increased.   HAND SURGERY Right 2018   LAPAROTOMY Right 02/27/2013   Procedure: EXPLORATORY LAPAROTOMY, right salpingo-oopherectomy;  Surgeon: [name], MD;  Location: [facility];  Service: Gynecology;  Laterality: Right;   left breast cyst     SALPINGOOPHORECTOMY Right 02/27/2013   Procedure: SALPINGO OOPHORECTOMY;  Surgeon: [name], MD;  Location: [facility];  Service: Gynecology;  Laterality: Right;   TUBAL LIGATION Bilateral 1989    Allergies  Allergen Reactions   Ace Inhibitors Cough   Lisinopril Cough     There is no immunization history on file for this patient.  Family History  Problem Relation Age of Onset   Hypertension Mother    Diabetes Mother    Throat cancer Mother    Hypertension Father    Diabetes Paternal Grandmother    Diabetes Sister    Heart Problems Sister  Kidney failure Sister    Hypertension Brother    Diabetes Brother    Healthy Son    Autoimmune disease Daughter      Current Outpatient Medications:    Accu-Chek FastClix Lancets MISC, Apply topically., Disp: , Rfl:    ACCU-CHEK GUIDE test strip, , Disp: , Rfl:    apixaban (ELIQUIS) 5 MG TABS tablet, TAKE 1 TABLET BY MOUTH TWICE A DAY, Disp: 60 tablet, Rfl: 3   atorvastatin (LIPITOR) 20 MG tablet, TAKE 1 TABLET (20 MG TOTAL) BY MOUTH DAILY. PLEASE SCHEDULE YEARLY APPOINTMENT FOR FUTURE REFILLS. 1ST ATTEMPT. THANK YOU, Disp: 30 tablet, Rfl: 2   B-D ULTRAFINE III SHORT PEN 31G X 8 MM MISC, Inject into the skin 2 (two) times daily., Disp: , Rfl:    blood glucose meter kit and supplies, Dispense based on patient and insurance preference. Use up to four times daily as directed. (FOR ICD-10 E10.9, E11.9)., Disp: 1 each, Rfl: 0   ferrous sulfate 325 (65 FE) MG tablet, Take 1 tablet  (325 mg total) by mouth daily with breakfast., Disp: 30 tablet, Rfl: 3   folic acid (FOLVITE) 1 MG tablet, Take 1 mg by mouth every morning., Disp: , Rfl: 3   furosemide (LASIX) 40 MG tablet, TAKE 1 TABLET BY MOUTH TWICE A DAY (Patient taking differently: Take 40 mg by mouth daily.), Disp: 180 tablet, Rfl: 2   losartan (COZAAR) 25 MG tablet, TAKE 1 TABLET BY MOUTH EVERY DAY, Disp: 90 tablet, Rfl: 3   metFORMIN (GLUCOPHAGE) 500 MG tablet, Take 2 tablets (1,000 mg total) by mouth 2 (two) times daily., Disp: 60 tablet, Rfl: 0   metoprolol tartrate (LOPRESSOR) 25 MG tablet, Take 0.5 tablets (12.5 mg total) by mouth 2 (two) times daily., Disp: 90 tablet, Rfl: 2   NOVOLOG MIX 70/30 FLEXPEN (70-30) 100 UNIT/ML FlexPen, Inject 0.4 mLs (40 Units total) into the skin 2 (two) times daily with a meal. (Patient taking differently: Inject 40-80 Units into the skin See admin instructions. Inject 40-80 units subcutaneously once or twice daily with meals - dose is based on CBG and carbs), Disp: 15 mL, Rfl: 0   predniSONE (DELTASONE) 10 MG tablet, Takes 6 tablets for 1 days, then 5 tablets for 1 days, then 4 tablets for 1 days, then 3 tablets for 1 days, then 2 tabs for 1 days, then 1 tab for 1 days, and then stop. (Patient taking differently: Take 10 mg by mouth daily.), Disp: 21 tablet, Rfl: 0      Objective:   Vitals:   04/08/22 1125  BP: 120/76  Pulse: 80  Temp: 97.9 F (36.6 C)  TempSrc: Oral  SpO2: 96%  Weight: 227 lb (103 kg)  Height: _0  (1.575 m)    Estimated body mass index is 41.52 kg/m as calculated from the following:   Height as of this encounter: _1  (1.575 m).   Weight as of this encounter: 227 lb (103 kg).  _2 @  Filed Weights   04/08/22 1125  Weight: 227 lb (103 kg)     Physical Exam  General: No distress.  Obese lady sitting in the wheelchair.  Giving good history she has mask on. Neuro: Alert and Oriented x 3. GCS 15. Speech normal Psych: Pleasant Resp:   Barrel Chest - no.  Wheeze - no, Crackles - yes, No overt respiratory distress CVS: Normal heart sounds. Murmurs - no Ext: Stigmata of Connective Tissue Disease - RA HEENT: Normal upper airway. PEERL +. No  post nasal drip        Assessment:       ICD-10-CM   1. Interstitial lung disease due to connective tissue disease (HCC)  J84.89 Rheumatoid factor   Q94.7 ANA    Cyclic citrul peptide antibody, IgG    Sedimentation rate    QuantiFERON-TB Gold Plus    Brain natriuretic peptide    CT Chest High Resolution    Pulmonary function test    Pulse oximetry, overnight    ECHOCARDIOGRAM COMPLETE    2. ILD (interstitial lung disease) ([place])  J84.9     3. 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 DOE (dyspnea on exertion)  R06.09 Rheumatoid factor    ANA    Cyclic citrul peptide antibody, IgG    Sedimentation rate    QuantiFERON-TB Gold Plus    Brain natriuretic peptide    CT Chest High Resolution    Pulmonary function test    Pulse oximetry, overnight    ECHOCARDIOGRAM COMPLETE    6. History of obstructive sleep apnea  Z86.69     7. History of atrial fibrillation  Z86.79      She likely has rheumatoid arthritis ILD but it appears that her obesity and rheumatoid arthritis and so many other medical problems are bigger issues.  She did have a diastolic heart failure or bronchitis recently and this is improved.  Currently she does not have any hypoxemia issues.  As long as the fibrosis is not progressive then we can watch without any antifibrotic's but pay attention to her medical problems including concern for sleep apnea, pulmonary hypertension, atrial fibrillation.  In fact medical assistant noticed significant atrial fibrillation and she is sending a note to cardiology today.  Additionally controlling rheumatoid arthritis would be important    Plan:     Patient Instructions     ICD-10-CM   1. Interstitial lung  disease due to connective tissue disease (HCC)  J84.89 Rheumatoid factor   M54.6 ANA    Cyclic citrul peptide antibody, IgG    Sedimentation rate    QuantiFERON-TB Gold Plus    Brain natriuretic peptide    CT Chest High Resolution    Pulmonary function test    Pulse oximetry, overnight    ECHOCARDIOGRAM COMPLETE    2. ILD (interstitial lung disease) ([place])  J84.9     3. 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 DOE (dyspnea on exertion)  R06.09 Rheumatoid factor    ANA    Cyclic citrul peptide antibody, IgG    Sedimentation rate    QuantiFERON-TB Gold Plus    Brain natriuretic peptide    CT Chest High Resolution    Pulmonary function test    Pulse oximetry, overnight    ECHOCARDIOGRAM COMPLETE    6. History of obstructive sleep apnea  Z86.69       You appear to have ILD due to RA Seen in CT in 2019 - likely same versus more organized now  Plan - need to stage and assess better - check RF, ANA, CCP, ESR, Quantiferon Gold, BNP - check full PFT - check ONO on room air - check HRCT supine and prone - check echo ; if anormal will need right heart cath - addres a Fib with cards  - antifibrotics for ILD indicated in setting of  RA - if/when there is > 5-10% progression  Followup  - next few weeks with APP to review results; reassss sleep apnea testing need    SIGNATURE    Dr. Brand Males, M.D., F.C.C.P,  Pulmonary and Critical Care Medicine Staff Physician, [place] Director - Interstitial Lung Disease  Program  Pulmonary [place] at [place], [place], [address]  Pager: [phone], If no answer or between  15:00h - 7:00h: call [phone] Telephone: [phone]  12:22 PM 04/08/2022

## 2022-04-08 NOTE — Patient Instructions (Addendum)
ICD-10-CM   1. Interstitial lung disease due to connective tissue disease (HCC)  J84.89 Rheumatoid factor   Z20.8 ANA    Cyclic citrul peptide antibody, IgG    Sedimentation rate    QuantiFERON-TB Gold Plus    Brain natriuretic peptide    CT Chest High Resolution    Pulmonary function test    Pulse oximetry, overnight    ECHOCARDIOGRAM COMPLETE    2. ILD (interstitial lung disease) ([place])  J84.9     3. 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 DOE (dyspnea on exertion)  R06.09 Rheumatoid factor    ANA    Cyclic citrul peptide antibody, IgG    Sedimentation rate    QuantiFERON-TB Gold Plus    Brain natriuretic peptide    CT Chest High Resolution    Pulmonary function test    Pulse oximetry, overnight    ECHOCARDIOGRAM COMPLETE    6. History of obstructive sleep apnea  Z86.69       You appear to have ILD due to RA Seen in CT in 2019 - likely same versus more organized now  Plan - need to stage and assess better - check RF, ANA, CCP, ESR, Quantiferon Gold, BNP - check full PFT - check ONO on room air - check HRCT supine and prone - check echo ; if anormal will need right heart cath - addres a Fib with cards  - antifibrotics for ILD indicated in setting of RA - if/when there is > 5-10% progression  Followup  - next few weeks with APP to review results; reassss sleep apnea testing need

## 2022-04-09 ENCOUNTER — Telehealth: Payer: Self-pay | Admitting: Cardiovascular Disease

## 2022-04-09 ENCOUNTER — Telehealth: Payer: Self-pay | Admitting: *Deleted

## 2022-04-09 DIAGNOSIS — Z79899 Other long term (current) drug therapy: Secondary | ICD-10-CM

## 2022-04-09 LAB — BASIC METABOLIC PANEL
BUN/Creatinine Ratio: 24 (ref 12–28)
BUN: 24 mg/dL (ref 8–27)
CO2: 21 mmol/L (ref 20–29)
Calcium: 10.1 mg/dL (ref 8.7–10.3)
Chloride: 109 mmol/L — ABNORMAL HIGH (ref 96–106)
Creatinine, Ser: 1 mg/dL (ref 0.57–1.00)
Glucose: 157 mg/dL — ABNORMAL HIGH (ref 70–99)
Potassium: 3.3 mmol/L — ABNORMAL LOW (ref 3.5–5.2)
Sodium: 154 mmol/L — ABNORMAL HIGH (ref 134–144)
eGFR: 63 mL/min/{1.73_m2} (ref >59)

## 2022-04-09 MED ORDER — POTASSIUM CHLORIDE CRYS ER 10 MEQ PO TBCR: 10.0000 meq | EXTENDED_RELEASE_TABLET | Freq: Two times a day (BID) | ORAL | 3 refills | Status: DC

## 2022-04-09 MED ORDER — METOPROLOL TARTRATE 25 MG PO TABS: 25.0000 mg | ORAL_TABLET | Freq: Two times a day (BID) | ORAL | 3 refills | Status: DC

## 2022-04-09 NOTE — Telephone Encounter (Signed)
Called pt to discuss lab results / med changes. See result note.

## 2022-04-09 NOTE — Addendum Note (Signed)
Addended by: Aris Georgia, Brytni Dray L on: 04/09/2022 10:52 AM   Modules accepted: Orders

## 2022-04-09 NOTE — Telephone Encounter (Signed)
Called patient back to let her know of medication changes. Will place order for metoprolol 25 mg BID (clarified with Dr. Johnsie Cancel of dose and frequency) and send to patient's pharmacy of choice. Patient verbalized understanding.

## 2022-04-09 NOTE — Telephone Encounter (Signed)
Called patient back. See phone note from 04/08/22.

## 2022-04-09 NOTE — Telephone Encounter (Signed)
Patient returning yesterday's call.

## 2022-04-11 ENCOUNTER — Telehealth: Payer: Self-pay | Admitting: Internal Medicine

## 2022-04-11 DIAGNOSIS — J8410 Pulmonary fibrosis, unspecified: Secondary | ICD-10-CM

## 2022-04-11 LAB — QUANTIFERON-TB GOLD PLUS
Mitogen-NIL: 0.43 IU/mL
NIL: 0.01 IU/mL
QuantiFERON-TB Gold Plus: UNDETERMINED — AB
TB1-NIL: 0 IU/mL
TB2-NIL: 0 IU/mL

## 2022-04-11 LAB — ANA: Anti Nuclear Antibody (ANA): NEGATIVE

## 2022-04-11 LAB — CYCLIC CITRUL PEPTIDE ANTIBODY, IGG: Cyclic Citrullin Peptide Ab: >250 UNITS — ABNORMAL HIGH

## 2022-04-11 LAB — RHEUMATOID FACTOR: Rheumatoid fact SerPl-aCnc: 904 IU/mL — ABNORMAL HIGH (ref <14)

## 2022-04-11 NOTE — Telephone Encounter (Signed)
Quantiferon GOld is indeterminate. Was negative a year ago  Plan  - recheck  next 1-2 weeks    SIGNATURE    Dr. Brand Males, M.D., F.C.C.P,  Pulmonary and Critical Care Medicine Staff Physician, [place] Director - Interstitial Lung Disease  Program  Medical Director - [facility] [place] at [place], [place], [address]  NPI Number:  NPI #[id number] [facility] Number: [id number]  Pager: [phone], If no answer  -[place] or Try [phone] Telephone (clinical office): [phone] Telephone (research): [phone]  5:16 PM 04/11/2022

## 2022-04-13 ENCOUNTER — Ambulatory Visit (INDEPENDENT_AMBULATORY_CARE_PROVIDER_SITE_OTHER): Payer: Medicaid Other | Admitting: Internal Medicine

## 2022-04-13 DIAGNOSIS — M359 Systemic involvement of connective tissue, unspecified: Secondary | ICD-10-CM

## 2022-04-13 DIAGNOSIS — J8489 Other specified interstitial pulmonary diseases: Secondary | ICD-10-CM

## 2022-04-13 DIAGNOSIS — R0609 Other forms of dyspnea: Secondary | ICD-10-CM

## 2022-04-13 LAB — PULMONARY FUNCTION TEST
DL/VA % pred: 79 %
DL/VA: 3.37 ml/min/mmHg/L
DLCO cor % pred: 60 %
DLCO cor: 11.21 ml/min/mmHg
DLCO unc % pred: 54 %
DLCO unc: 10.11 ml/min/mmHg
FEF 25-75 Post: 3.39 L/sec
FEF 25-75 Pre: 1.73 L/sec
FEF2575-%Change-Post: 95 %
FEF2575-%Pred-Post: 186 %
FEF2575-%Pred-Pre: 95 %
FEV1-%Change-Post: 22 %
FEV1-%Pred-Post: 116 %
FEV1-%Pred-Pre: 95 %
FEV1-Post: 2.15 L
FEV1-Pre: 1.75 L
FEV1FVC-%Change-Post: 13 %
FEV1FVC-%Pred-Pre: 101 %
FEV6-%Change-Post: 7 %
FEV6-%Pred-Post: 102 %
FEV6-%Pred-Pre: 95 %
FEV6-Post: 2.33 L
FEV6-Pre: 2.17 L
FEV6FVC-%Pred-Post: 103 %
FEV6FVC-%Pred-Pre: 103 %
FVC-%Change-Post: 8 %
FVC-%Pred-Post: 99 %
FVC-%Pred-Pre: 92 %
FVC-Post: 2.35 L
FVC-Pre: 2.18 L
Post FEV1/FVC ratio: 91 %
Post FEV6/FVC ratio: 100 %
Pre FEV1/FVC ratio: 80 %
Pre FEV6/FVC Ratio: 100 %
RV % pred: -37 %
RV: -0.74 L
TLC % pred: 67 %
TLC: 3.2 L

## 2022-04-13 NOTE — Progress Notes (Signed)
Full PFT performed today. °

## 2022-04-13 NOTE — Telephone Encounter (Signed)
Called and spoke with pt letting her know the results of bloodwork and she verbalized understanding. Order has been placed for the repeat quantiferon gold. Nothing further needed.

## 2022-04-13 NOTE — Patient Instructions (Signed)
Full PFT performed today. °

## 2022-04-16 ENCOUNTER — Telehealth: Payer: Self-pay | Admitting: Internal Medicine

## 2022-04-16 ENCOUNTER — Ambulatory Visit (HOSPITAL_COMMUNITY)
Admission: RE | Admit: 2022-04-16 | Discharge: 2022-04-16 | Disposition: A | Discharge status: Home / Self Care | Payer: Medicaid Other | Source: Ambulatory Visit | Attending: Internal Medicine | Admitting: Internal Medicine

## 2022-04-16 DIAGNOSIS — R0609 Other forms of dyspnea: Secondary | ICD-10-CM | POA: Insufficient documentation

## 2022-04-16 DIAGNOSIS — M359 Systemic involvement of connective tissue, unspecified: Secondary | ICD-10-CM | POA: Insufficient documentation

## 2022-04-16 DIAGNOSIS — J8489 Other specified interstitial pulmonary diseases: Secondary | ICD-10-CM | POA: Diagnosis present

## 2022-04-16 NOTE — Telephone Encounter (Signed)
Overnight pulse oximetry on Kathleen Kane date of birth 4/28 1962 done on 04/14/2022 shows pulse ox less than equal to 88% for 7 minutes and 40 seconds  Plan - This is borderline low.  If she wants she can use 1 L oxygen and see how she feels in the daytime.  If she wants to follow along that is fine to.

## 2022-04-19 ENCOUNTER — Other Ambulatory Visit: Payer: Medicaid Other

## 2022-04-19 ENCOUNTER — Ambulatory Visit (INDEPENDENT_AMBULATORY_CARE_PROVIDER_SITE_OTHER): Payer: Medicaid Other | Admitting: Adult Health

## 2022-04-19 ENCOUNTER — Encounter: Payer: Self-pay | Admitting: Adult Health

## 2022-04-19 VITALS — BP 104/60 | HR 89 | Temp 98.1°F | Ht 62.75 in | Wt 218.0 lb

## 2022-04-19 DIAGNOSIS — J849 Interstitial pulmonary disease, unspecified: Secondary | ICD-10-CM

## 2022-04-19 DIAGNOSIS — M059 Rheumatoid arthritis with rheumatoid factor, unspecified: Secondary | ICD-10-CM | POA: Diagnosis not present

## 2022-04-19 DIAGNOSIS — Z79899 Other long term (current) drug therapy: Secondary | ICD-10-CM

## 2022-04-19 DIAGNOSIS — G4734 Idiopathic sleep related nonobstructive alveolar hypoventilation: Secondary | ICD-10-CM | POA: Diagnosis not present

## 2022-04-19 MED ORDER — LEVALBUTEROL TARTRATE 45 MCG/ACT IN AERO: 1.0000 | INHALATION_SPRAY | Freq: Four times a day (QID) | RESPIRATORY_TRACT | 3 refills | Status: DC | PRN

## 2022-04-19 NOTE — Patient Instructions (Addendum)
Refer to Rheumatology  May try Xopenex inhaler 1-2 puffs every 6 hr as needed  Activity as tolerated.  Echo this week as planned.  Follow up with Dr. Chase Caller in 3 months and As needed   Please contact office for sooner follow up if symptoms do not improve or worsen or seek emergency care

## 2022-04-19 NOTE — Progress Notes (Unsigned)
'@Patient'  ID: Kathleen Kane, female    DOB: [birth year], 61 y.o.   MRN: [id number]  Chief Complaint  Patient presents with   Follow-up    Referring provider: [name], MD  HPI: 61 year old female former smoker seen for pulmonary consult April 08, 2022 for interstitial lung disease Medical history significant for rheumatoid arthritis, atrial fibrillation on anticoagulation therapy  TEST/EVENTS :  04/14/2022 ONO <88% for 7 minutes and 40 seconds  CT chest Mar 09, 2022 shows extensive fibrotic changes with associated bronchiectasis bilaterally  04/19/2022 Follow up : RA-ILD  Patient returns for a 2-week follow-up.  Patient was seen last visit for a pulmonary consult for interstitial lung disease.  Patient has underlying rheumatoid arthritis.  Says she has been on multiple treatments in the past including methotrexate, Humira, Orencia but is currently off of all therapy due to intolerance.  Patient says that she has been discharged from her rheumatologist recently and needs a new rheumatologist.  Patient was set up for high-resolution CT chest completed on April 16, 2022 that shows patchy pulmonary parenchymal groundglass, coarsening bronchiectasis and subpleural reticulation.  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  Mediastinal adenopathy in the setting of ILD and appears slightly improved from CT on Mar 09, 2022.  Lab work shows decrease sed rate at 37, positive CCP greater than 250, negative ANA, positive rheumatoid factor at 904, decreased BNP at 169.  QuantiFERON gold was -1-year ago most recent test showed indeterminate.  2D echo is pending.  Was admitted to the hospital early May for acute respiratory failure felt secondary to possible atypical pneumonia.  She was treated with empiric antibiotics and steroids.  Along with nebulized bronchodilators.  Weaned off of oxygen prior to  discharge. Patient says overall she is actually feeling better since discharge.  She has had less joint swelling and pain.  Breathing has been better.  She says since coming off the methotrexate she is felt significantly improved. She was set up for pulmonary function testing completed on April 13, 2022 that showed no airflow obstruction or restriction.  FEV1 116%, ratio 91, FVC 99%, positive bronchodilator response, DLCO decreased at 54%.Marland Kitchen  She was set up for an overnight oximetry test.  This is showed minimum desaturations with O2 saturations less than 88%.  7 minutes and 40 seconds.  *Test results.  Patient wants to hold off on starting oxygen.    Allergies  Allergen Reactions   Ace Inhibitors Cough   Lisinopril Cough     There is no immunization history on file for this patient.  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 AHI-6.8/hr, during REM-27.3/hr. RDI-25.0/hr, during REM-40.9/hr. avg o2 sat during REM and NREM 95%   OSA (obstructive sleep apnea) 02/10/2017   Pelvic mass in female 01/29/2013   With ascites  Pneumonia 10/02/2018   Rheumatoid arthritis ([place])    Rheumatoid arthritis, 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[place]) 10/08/2015    Tobacco History: Social History   Tobacco Use  Smoking Status Former   Packs/day: 1.00   Years: 36.00   Total pack years: 36.00   Types: Cigarettes   Start date: 33    Quit date: 06/08/2014   Years since quitting: 7.8   Passive exposure: Never  Smokeless Tobacco Never   Counseling given: Not Answered   Outpatient Medications Prior to Visit  Medication Sig Dispense Refill   Accu-Chek FastClix Lancets MISC Apply topically.     ACCU-CHEK GUIDE test strip      apixaban (ELIQUIS) 5 MG TABS tablet TAKE 1 TABLET BY MOUTH TWICE A DAY 60 tablet 3   atorvastatin (LIPITOR) 20 MG tablet TAKE 1 TABLET (20 MG TOTAL) BY MOUTH DAILY. PLEASE SCHEDULE YEARLY APPOINTMENT FOR FUTURE REFILLS. 1ST ATTEMPT. THANK YOU 30 tablet 2   B-D ULTRAFINE III SHORT PEN 31G X 8 MM MISC Inject into the skin 2 (two) times daily.     blood glucose meter kit and supplies Dispense based on patient and insurance preference. Use up to four times daily as directed. (FOR ICD-10 E10.9, E11.9). 1 each 0   ferrous sulfate 325 (65 FE) MG tablet Take 1 tablet (325 mg total) by mouth daily with breakfast. 30 tablet 3   folic acid (FOLVITE) 1 MG tablet Take 1 mg by mouth every morning.  3   furosemide (LASIX) 40 MG tablet TAKE 1 TABLET BY MOUTH TWICE A DAY (Patient taking differently: Take 40 mg by mouth daily.) 180 tablet 2   losartan (COZAAR) 25 MG tablet TAKE 1 TABLET BY MOUTH EVERY DAY 90 tablet 3   metFORMIN (GLUCOPHAGE) 500 MG tablet Take 2 tablets (1,000 mg total) by mouth 2 (two) times daily. 60 tablet 0   metoprolol tartrate (LOPRESSOR) 25 MG tablet Take 1 tablet (25 mg total) by mouth 2 (two) times daily. 180 tablet 3   NOVOLOG MIX 70/30 FLEXPEN (70-30) 100 UNIT/ML FlexPen Inject 0.4 mLs (40 Units total) into the skin 2 (two) times daily with a meal. (Patient taking differently: Inject 40-80 Units into the skin See admin instructions. Inject 40-80 units subcutaneously once or twice daily with meals - dose is based on CBG and carbs) 15 mL 0   potassium chloride (KLOR-CON M) 10 MEQ tablet Take 1 tablet (10 mEq total) by mouth 2 (two) times daily. 60 tablet 3   predniSONE (DELTASONE) 10 MG tablet  Takes 6 tablets for 1 days, then 5 tablets for 1 days, then 4 tablets for 1 days, then 3 tablets for 1 days, then 2 tabs for 1 days, then 1 tab for 1 days, and then stop. (Patient taking differently: Take 10 mg by mouth daily.) 21 tablet 0   No facility-administered medications prior to visit.     Review of Systems:   Constitutional:   No  weight loss, night sweats,  Fevers, chills,  +fatigue, or  lassitude.  HEENT:   No headaches,  Difficulty swallowing,  Tooth/dental problems, or  Sore throat,                No sneezing, itching, ear ache, nasal congestion, post nasal drip,   CV:  No chest pain,  Orthopnea, PND, swelling in lower extremities, anasarca, dizziness, palpitations, syncope.   GI  No heartburn, indigestion, abdominal pain, nausea, vomiting, diarrhea, change in bowel habits, loss of appetite, bloody stools.   Resp:  No chest wall deformity  Skin: no rash or lesions.  GU: no dysuria, change in color of urine, no urgency or frequency.  No flank pain, no hematuria   MS:  No joint pain or swelling.  No decreased range of motion.  No back pain.    Physical Exam  BP 104/60 (BP Location: Left Arm, Patient Position: Sitting, Cuff Size: Large)   Pulse 89   Temp 98.1 F (36.7 C) (Oral)   Ht 5' 2.75" (1.594 m)   Wt 218 lb (98.9 kg)   LMP 10/28/2013   SpO2 96%   BMI 38.93 kg/m   GEN: A/Ox3; pleasant , NAD, well nourished    HEENT:  [place]/AT,  EACs-clear, TMs-wnl, NOSE-clear, THROAT-clear, no lesions, no postnasal drip or exudate noted.   NECK:  Supple w/ fair ROM; no JVD; normal carotid impulses w/o bruits; no thyromegaly or nodules palpated; no lymphadenopathy.    RESP  Faint BB crackles  no accessory muscle use, no dullness to percussion  CARD:  RRR, no m/r/g, no peripheral edema, pulses intact, no cyanosis or clubbing.  GI:   Soft & nt; nml bowel sounds; no organomegaly or masses detected.   Musco: Warm bil, no deformities or joint swelling noted.   Neuro:  alert, no focal deficits noted.    Skin: Warm, no lesions or rashes    Lab Results:    BNP    Imaging: CT Chest High Resolution  Result Date: 04/19/2022 CLINICAL DATA:  Interstitial lung disease. EXAM: CT CHEST WITHOUT CONTRAST TECHNIQUE: Multidetector CT imaging of the chest was performed following the standard protocol without intravenous contrast. High resolution imaging of the lungs, as well as inspiratory and expiratory imaging, was performed. RADIATION DOSE REDUCTION: This exam was performed according to the departmental dose-optimization program which includes automated exposure control, adjustment of the mA and/or kV according to patient size and/or use of iterative reconstruction technique. COMPARISON:  03/09/2022 and 10/01/2018. FINDINGS: Cardiovascular: Atherosclerotic calcification the aorta, aortic valve and coronary arteries. Aberrant right subclavian artery. Enlarged pulmonic trunk and heart. No pericardial effusion. Mediastinum/Nodes: Mediastinal lymph nodes measure up to 12 mm in the low right paratracheal station, decreased from 1.4 cm. Hilar regions are difficult to evaluate without IV contrast. Axillary lymph nodes are not enlarged by CT size criteria. Esophagus is grossly unremarkable. Lungs/Pleura: Patchy pulmonary parenchymal ground-glass, coarsening, cylindrical bronchiectasis and scattered subpleural reticular densities. No honeycombing. No pleural fluid. Airway is unremarkable. No air trapping. Upper Abdomen: Visualized portion of the liver is unremarkable. There may be tiny stones layering in the gallbladder. Visualized portions of the adrenal glands, kidneys, spleen, pancreas, stomach and bowel are grossly unremarkable. Musculoskeletal: Degenerative changes in the spine. No worrisome lytic or sclerotic lesions. IMPRESSION: 1. Pulmonary parenchymal pattern of fibrosis may be due to fibrotic nonspecific interstitial pneumonitis. Fibrotic hypersensitivity pneumonitis is  considered less likely in the absence of air trapping but is not excluded. Findings are suggestive of an alternative diagnosis (not UIP) per consensus guidelines: Diagnosis of Idiopathic Pulmonary Fibrosis: An Official ATS/ERS/JRS/ALAT Clinical Practice Guideline. [place], Iss 5, [phone], Jul 09 2017. 2. Mediastinal adenopathy can be seen the setting of interstitial lung disease and appears slightly improved from 03/09/2022. 3. Possible cholelithiasis. 4. Aortic atherosclerosis (ICD10-I70.0). Coronary artery calcification. 5. Enlarged pulmonic trunk, indicative of pulmonary arterial hypertension. Electronically Signed   By: Lorin Picket M.D.   On: 04/19/2022 11:22         Latest Ref Rng &  Units 04/13/2022   12:52 PM  PFT Results  FVC-Pre L 2.18  P  FVC-Predicted Pre % 92  P  FVC-Post L 2.35  P  FVC-Predicted Post % 99  P  Pre FEV1/FVC % % 80  P  Post FEV1/FCV % % 91  P  FEV1-Pre L 1.75  P  FEV1-Predicted Pre % 95  P  FEV1-Post L 2.15  P  DLCO uncorrected ml/min/mmHg 10.11  P  DLCO UNC% % 54  P  DLCO corrected ml/min/mmHg 11.21  P  DLCO COR %Predicted % 60  P  DLVA Predicted % 79  P  TLC L 3.20  P  TLC % Predicted % 67  P  RV % Predicted % -37  P    P Preliminary result    No results found for: "NITRICOXIDE"      Assessment & Plan:   No problem-specific Assessment & Plan notes found for this encounter.     Rexene Edison, NP 04/19/2022

## 2022-04-20 LAB — BASIC METABOLIC PANEL
BUN/Creatinine Ratio: 23 (ref 12–28)
BUN: 30 mg/dL — ABNORMAL HIGH (ref 8–27)
CO2: 21 mmol/L (ref 20–29)
Calcium: 9.7 mg/dL (ref 8.7–10.3)
Chloride: 102 mmol/L (ref 96–106)
Creatinine, Ser: 1.32 mg/dL — ABNORMAL HIGH (ref 0.57–1.00)
Glucose: 109 mg/dL — ABNORMAL HIGH (ref 70–99)
Potassium: 3.8 mmol/L (ref 3.5–5.2)
Sodium: 142 mmol/L (ref 134–144)
eGFR: 45 mL/min/{1.73_m2} — ABNORMAL LOW (ref >59)

## 2022-04-20 NOTE — Telephone Encounter (Signed)
Attempted to call pt but unable to reach and unable to leave VM as mailbox was full. Will try to call back later.

## 2022-04-21 ENCOUNTER — Telehealth: Payer: Self-pay | Admitting: Cardiovascular Disease

## 2022-04-21 ENCOUNTER — Other Ambulatory Visit: Payer: Medicaid Other

## 2022-04-21 ENCOUNTER — Other Ambulatory Visit: Payer: Self-pay | Admitting: *Deleted

## 2022-04-21 DIAGNOSIS — J849 Interstitial pulmonary disease, unspecified: Secondary | ICD-10-CM | POA: Insufficient documentation

## 2022-04-21 DIAGNOSIS — Z79899 Other long term (current) drug therapy: Secondary | ICD-10-CM

## 2022-04-21 DIAGNOSIS — J8410 Pulmonary fibrosis, unspecified: Secondary | ICD-10-CM

## 2022-04-21 DIAGNOSIS — G4734 Idiopathic sleep related nonobstructive alveolar hypoventilation: Secondary | ICD-10-CM | POA: Insufficient documentation

## 2022-04-21 DIAGNOSIS — I5033 Acute on chronic diastolic (congestive) heart failure: Secondary | ICD-10-CM

## 2022-04-21 MED ORDER — SPIRONOLACTONE 25 MG PO TABS: 12.5000 mg | ORAL_TABLET | Freq: Every day | ORAL | 3 refills | Status: DC

## 2022-04-21 NOTE — Telephone Encounter (Signed)
-----   Message from Emmaline Life, NP sent at 04/20/2022  5:26 AM EDT ----- Creatinine is slightly elevated but stable. Electrolytes have improved. Would like to start spironolactone 12.5 mg in addition to your other medications. This will help get rid of some of the fluid since you prefer to take Lasix only once daily. My CMA will contact you to get the prescription sent in and to schedule a repeat blood test one week after starting. We will continue potassium supplement for now and recheck potassium in one week.

## 2022-04-21 NOTE — Assessment & Plan Note (Signed)
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  Patient does have underlying rheumatoid arthritis.  Currently not on maintenance regimen for rheumatoid arthritis.  Will refer to new rheumatologist as she has been discharged from previous rheumatologist. She has had difficulty tolerating immunosuppressive therapy. Clinically appears to be stable with improvement in symptoms since coming off of methotrexate. Lab work was reviewed in detail with positive RA factor.  2D echo is pending-suspicious for underlying pulmonary hypertension related ILD I would know with minimum desaturations.  Patient was to hold off on oxygen at this time. PFTs show no airflow obstruction or restriction.  Does have some reversibility may have a component of some reactive airways/mild intermittent asthma.  Will give Xopenex inhaler to have as needed.  Her DLCO is decreased consistent with underlying ILD. Would repeat PFTs with spirometry with DLCO in 6 months. Hold on antifibrotic's at this time.  Get in with new rheumatologist and decide on maintenance regimen for RA.  Plan  Patient Instructions  Refer to Rheumatology  May try Xopenex inhaler 1-2 puffs every 6 hr as needed  Activity as tolerated.  Echo this week as planned.  Follow up with Dr. Chase Caller in 3 months and As needed   Please contact office for sooner follow up if symptoms do not improve or worsen or seek emergency care

## 2022-04-21 NOTE — Telephone Encounter (Signed)
Patient is returning call from yesterday regarding her lab results. Please advise.

## 2022-04-21 NOTE — Telephone Encounter (Signed)
Spoke w the patient.  She voices understanding and agreement with plan.  She will continue Lasix 40 mg daily and potassium 10 meq twice a day and add spironolactone 12.5 mg daily.  She will come in for lab work next Thursday.

## 2022-04-21 NOTE — Assessment & Plan Note (Signed)
Rheumatoid arthritis refer to new rheumatologist to establish for care.

## 2022-04-21 NOTE — Assessment & Plan Note (Signed)
Overnight oximetry test shows minimum nocturnal desaturations.  Patient will hold off on beginning oxygen at this time.

## 2022-04-22 ENCOUNTER — Ambulatory Visit (HOSPITAL_COMMUNITY): Payer: Medicaid Other | Attending: Internal Medicine

## 2022-04-22 DIAGNOSIS — I4819 Other persistent atrial fibrillation: Secondary | ICD-10-CM | POA: Diagnosis not present

## 2022-04-22 DIAGNOSIS — I340 Nonrheumatic mitral (valve) insufficiency: Secondary | ICD-10-CM | POA: Diagnosis not present

## 2022-04-22 DIAGNOSIS — E785 Hyperlipidemia, unspecified: Secondary | ICD-10-CM | POA: Insufficient documentation

## 2022-04-22 DIAGNOSIS — I4811 Longstanding persistent atrial fibrillation: Secondary | ICD-10-CM | POA: Diagnosis present

## 2022-04-22 LAB — ECHOCARDIOGRAM COMPLETE: S' Lateral: 3.3 cm

## 2022-04-26 NOTE — Telephone Encounter (Signed)
Called and spoke with pt letting her know the results of the ONO and she said she wanted to hold off on O2 for now. Nothing further needed.

## 2022-04-27 LAB — QUANTIFERON-TB GOLD PLUS
Mitogen-NIL: 0.46 IU/mL
NIL: 0.1 IU/mL
QuantiFERON-TB Gold Plus: UNDETERMINED — AB
TB1-NIL: <0 IU/mL
TB2-NIL: <0 IU/mL

## 2022-04-28 NOTE — Progress Notes (Signed)
Office Visit Note  Patient: Kathleen Kane             Date of Birth: 1961-10-06           MRN: [id number]             PCP: [name], MD Referring: Melvenia Needles, NP Visit Date: 04/29/2022 Occupation: '@GUAROCC'$ @  Subjective:  Joint stiffness  History of Present Illness: Kathleen Kane is a 61 y.o. female with history of rheumatoid arthritis, osteoarthritis and ILD.  She states she has been doing well on prednisone 10 mg p.o. daily.  She does not have any joint swelling.  She continues to have some discomfort in her knee joints due to severe osteoarthritis.  She has the stiffness lasting all day in her knees.  None of the other joints are painful.  She states her rheumatoid nodules resolved on prednisone.  She recently had to have a CT scan by the pulmonologist.  She was diagnosed with ILD but no treatment was recommended.  She states she will have CT scan in the future.  She has not had any episodes of gout.  Activities of Daily Living:  Patient reports morning stiffness for 24 hours.   Patient Reports nocturnal pain.  Difficulty dressing/grooming: Denies Difficulty climbing stairs: Reports Difficulty getting out of chair: Reports Difficulty using hands for taps, buttons, cutlery, and/or writing: Reports  Review of Systems  Constitutional:  Negative for fatigue.  HENT:  Negative for mouth dryness.   Eyes:  Negative for dryness.  Respiratory:  Negative for shortness of breath.   Cardiovascular:  Negative for swelling in legs/feet.  Gastrointestinal:  Negative for constipation.  Endocrine: Negative for excessive thirst.  Genitourinary:  Negative for difficulty urinating.  Musculoskeletal:  Positive for joint pain, gait problem, joint pain, muscle weakness and morning stiffness. Negative for joint swelling.  Skin:  Negative for rash.  Allergic/Immunologic: Positive for susceptible to infections.  Neurological:  Negative for weakness.  Hematological:  Negative for  bruising/bleeding tendency.  Psychiatric/Behavioral:  Negative for sleep disturbance.     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   Dysphagia 01/26/2022   Personal history of colonic polyps 01/26/2022   Aortic atherosclerosis ([place]) 05/13/2021   Acute otalgia, right 11/26/2020   Arthralgia of right temporomandibular joint 11/26/2020   Bilateral impacted cerumen 11/26/2020   HCAP (healthcare-associated pneumonia) 10/02/2018   OSA (obstructive sleep apnea) 02/10/2017   Carpal tunnel syndrome, right 12/15/2016   Extensor tenosynovitis of wrist, right 12/15/2016   Bilateral primary osteoarthritis of knee 10/08/2015   Rheumatoid arthritis, 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[place]) 10/08/2015   Acute on chronic diastolic CHF (congestive heart failure) ([place]) 08/08/2015   Morbid obesity ([place]) 05/06/2015   Essential hypertension 04/10/2015   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   Primary osteoarthritis involving multiple joints 04/09/2015   Elevated uric acid in blood 01/23/2014   High risk medication use 01/23/2014   Numbness and tingling in left hand 10/01/2013   Tobacco abuse 10/01/2013   Elevated CA-125 02/06/2013   Pelvic mass in female 01/29/2013   Diabetes mellitus ([place]) 08/10/2012   Atrial fibrillation (HCC)    Chronic diastolic CHF (congestive heart failure) ([place])  Past Medical History:  Diagnosis Date   Acute on chronic diastolic CHF (congestive heart failure) ([place]) 08/08/2015   Anxiety    Atrial fibrillation, persistent (HCC)    Carpal tunnel syndrome, right 12/15/2016   CHF (congestive heart failure) ([place]) 2013   No echo found from that time, most likely diastolic   CHF  exacerbation ([place]) 10/01/2018   Depression    Diabetes mellitus ([place]) 08/10/2012   Diabetes mellitus without complication ([place])    Hypertension    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 AHI-6.8/hr, during REM-27.3/hr. RDI-25.0/hr, during REM-40.9/hr. avg o2 sat during REM and NREM 95%   OSA (obstructive sleep apnea) 02/10/2017   Pelvic mass in female 01/29/2013   With ascites    Pneumonia 10/02/2018   Rheumatoid arthritis ([place])    Rheumatoid arthritis, 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[place]) 10/08/2015    Family History  Problem Relation Age of Onset   Hypertension Mother    Diabetes Mother    Throat cancer Mother    Hypertension Father    Diabetes Sister    Heart Problems Sister    Kidney failure Sister    Hypertension Brother    Diabetes Brother    Diabetes Paternal Grandmother    Autoimmune disease Daughter    Healthy Son    Past Surgical History:  Procedure Laterality Date   CARDIOVASCULAR STRESS TEST  02/16/2013   no significant EKG changes with Lexiscan, normal LV function and normal wall function   CESAREAN SECTION     x2   DOPPLER ECHOCARDIOGRAPHY  01/24/2008   EF 55-60%, no diagnostic evidence of LV wall motion abnormalities, LV wall thickness was mild-moderately increased.   HAND SURGERY Right 2018   LAPAROTOMY Right 02/27/2013   Procedure: EXPLORATORY LAPAROTOMY, right salpingo-oopherectomy;  Surgeon: [name], MD;  Location: [facility];  Service: Gynecology;  Laterality: Right;   left breast cyst     SALPINGOOPHORECTOMY Right 02/27/2013   Procedure: SALPINGO OOPHORECTOMY;  Surgeon: [name], MD;  Location: [facility];  Service: Gynecology;  Laterality: Right;   TUBAL LIGATION Bilateral 1989   Social History   Social History Narrative   Not on file    There is no immunization history on file for  this patient.   Objective: Vital Signs: BP 124/74 (BP Location: Left Arm, Patient Position: Sitting, Cuff Size: Normal)   Pulse 84   Resp 17   Ht 5' 2.75" (1.594 m)   Wt 222 lb (100.7 kg)   LMP 10/28/2013   BMI 39.64 kg/m    Physical Exam Vitals and nursing note reviewed.  Constitutional:      Appearance: She is well-developed.  HENT:     Head: Normocephalic and atraumatic.  Eyes:     Conjunctiva/sclera: Conjunctivae normal.  Cardiovascular:     Rate and Rhythm: Normal rate and regular rhythm.     Heart sounds: Normal heart sounds.  Pulmonary:     Effort: Pulmonary effort is normal.     Breath sounds: Normal breath sounds.  Abdominal:     General: Bowel sounds are normal.     Palpations: Abdomen is soft.  Musculoskeletal:     Cervical back: Normal range of motion.  Lymphadenopathy:     Cervical: No cervical adenopathy.  Skin:    General: Skin is warm and dry.  Capillary Refill: Capillary refill takes less than 2 seconds.  Neurological:     Mental Status: She is alert and oriented to person, place, and time.  Psychiatric:        Behavior: Behavior normal.      Musculoskeletal Exam: C-spine was in good range of motion.  Shoulder joints, elbow joints were in good range of motion.  She had limited range of motion of her right wrist joint due to previous surgery.  There is no synovitis over wrist joints MCPs PIPs or DIPs.  Hip joints were difficult to assess.  Knee joints in good range of motion without any warmth swelling or effusion.  There was no tenderness over ankles or MTPs.  CDAI Exam: CDAI Score: -- Patient Global: --; Provider Global: -- Swollen: 0 ; Tender: 0  Joint Exam 04/29/2022   No joint exam has been documented for this visit   There is currently no information documented on the homunculus. Go to the Rheumatology activity and complete the homunculus joint exam.  Investigation: No additional findings.  Imaging: ECHOCARDIOGRAM COMPLETE  Result  Date: 04/22/2022    ECHOCARDIOGRAM REPORT   Patient Name:   TARONDA COMACHO [facility] Date of Exam: 04/22/2022 Medical Rec #:  [id number]       Height:       62.8 in Accession #:    [id number]      Weight:       218.0 lb Date of Birth:  February 15, 1961       BSA:          2.000 m Patient Age:    58 years        BP:           104/60 mmHg Patient Gender: F               HR:           89 bpm. Exam Location:  [place] Procedure: 2D Echo, Cardiac Doppler and Color Doppler Indications:    I34.0 Mitral Valve Insufficiency  History:        Patient has prior history of Echocardiogram examinations, most                 recent 10/22/2021. Arrythmias:Atrial Fibrillation; Risk                 Factors:Hypertension, Diabetes and Sleep Apnea. Rheumatoid                 Arthritis.  Sonographer:    Cresenciano Lick RDCS Referring Phys: [place]  1. Left ventricular ejection fraction, by estimation, is 60 to 65%. The left ventricle has normal function. The left ventricle has no regional wall motion abnormalities. There is moderate concentric left ventricular hypertrophy. Left ventricular diastolic parameters are indeterminate.  2. Right ventricular systolic function is normal. The right ventricular size is normal.  3. Left atrial size was moderately dilated.  4. The mitral valve is normal in structure. Mild mitral valve regurgitation.  5. The aortic valve is normal in structure. Aortic valve regurgitation is not visualized.  6. The inferior vena cava is normal in size with greater than 50% respiratory variability, suggesting right atrial pressure of 3 mmHg. FINDINGS  Left Ventricle: Left ventricular ejection fraction, by estimation, is 60 to 65%. The left ventricle has normal function. The left ventricle has no regional wall motion abnormalities. The left ventricular internal cavity size was normal in size. There is  moderate concentric left ventricular  hypertrophy. Left ventricular diastolic parameters are  indeterminate. Right Ventricle: The right ventricular size is normal. Right vetricular wall thickness was not assessed. Right ventricular systolic function is normal. Left Atrium: Left atrial size was moderately dilated. Right Atrium: Right atrial size was normal in size. Pericardium: There is no evidence of pericardial effusion. Mitral Valve: The mitral valve is normal in structure. Mild mitral valve regurgitation. Tricuspid Valve: The tricuspid valve is normal in structure. Tricuspid valve regurgitation is trivial. Aortic Valve: The aortic valve is normal in structure. Aortic valve regurgitation is not visualized. Pulmonic Valve: The pulmonic valve was not well visualized. Pulmonic valve regurgitation is not visualized. No evidence of pulmonic stenosis. Aorta: The aortic root and ascending aorta are structurally normal, with no evidence of dilitation. Venous: The inferior vena cava is normal in size with greater than 50% respiratory variability, suggesting right atrial pressure of 3 mmHg. IAS/Shunts: No atrial level shunt detected by color flow Doppler.  LEFT VENTRICLE PLAX 2D LVIDd:         4.70 cm LVIDs:         3.30 cm LV PW:         1.50 cm LV IVS:        1.50 cm LVOT diam:     1.80 cm LV SV:         30 LV SV Index:   15 LVOT Area:     2.54 cm  RIGHT VENTRICLE RV Basal diam:  3.90 cm RV S prime:     9.03 cm/s TAPSE (M-mode): 1.9 cm LEFT ATRIUM             Index        RIGHT ATRIUM           Index LA diam:        4.50 cm 2.25 cm/m   RA Area:     15.60 cm LA Vol (A2C):   49.9 ml 24.95 ml/m  RA Volume:   42.60 ml  21.30 ml/m LA Vol (A4C):   81.5 ml 40.74 ml/m LA Biplane Vol: 67.2 ml 33.59 ml/m  AORTIC VALVE LVOT Vmax:   67.60 cm/s LVOT Vmean:  43.750 cm/s LVOT VTI:    0.120 m  AORTA Ao Root diam: 2.80 cm Ao Asc diam:  3.30 cm MV E velocity: 91.53 cm/s  TRICUSPID VALVE                            TR Peak grad:   19.4 mmHg                            TR Vmax:        220.00 cm/s                              SHUNTS                            Systemic VTI:  0.12 m                            Systemic Diam: 1.80 cm Dorris Carnes MD Electronically signed by Dorris Carnes MD Signature Date/Time: 04/22/2022/6:15:55 PM    Final    CT Chest High Resolution  Result Date: 04/19/2022 CLINICAL DATA:  Interstitial  lung disease. EXAM: CT CHEST WITHOUT CONTRAST TECHNIQUE: Multidetector CT imaging of the chest was performed following the standard protocol without intravenous contrast. High resolution imaging of the lungs, as well as inspiratory and expiratory imaging, was performed. RADIATION DOSE REDUCTION: This exam was performed according to the departmental dose-optimization program which includes automated exposure control, adjustment of the mA and/or kV according to patient size and/or use of iterative reconstruction technique. COMPARISON:  03/09/2022 and 10/01/2018. FINDINGS: Cardiovascular: Atherosclerotic calcification the aorta, aortic valve and coronary arteries. Aberrant right subclavian artery. Enlarged pulmonic trunk and heart. No pericardial effusion. Mediastinum/Nodes: Mediastinal lymph nodes measure up to 12 mm in the low right paratracheal station, decreased from 1.4 cm. Hilar regions are difficult to evaluate without IV contrast. Axillary lymph nodes are not enlarged by CT size criteria. Esophagus is grossly unremarkable. Lungs/Pleura: Patchy pulmonary parenchymal ground-glass, coarsening, cylindrical bronchiectasis and scattered subpleural reticular densities. No honeycombing. No pleural fluid. Airway is unremarkable. No air trapping. Upper Abdomen: Visualized portion of the liver is unremarkable. There may be tiny stones layering in the gallbladder. Visualized portions of the adrenal glands, kidneys, spleen, pancreas, stomach and bowel are grossly unremarkable. Musculoskeletal: Degenerative changes in the spine. No worrisome lytic or sclerotic lesions. IMPRESSION: 1. Pulmonary parenchymal pattern of fibrosis may be  due to fibrotic nonspecific interstitial pneumonitis. Fibrotic hypersensitivity pneumonitis is considered less likely in the absence of air trapping but is not excluded. Findings are suggestive of an alternative diagnosis (not UIP) per consensus guidelines: Diagnosis of Idiopathic Pulmonary Fibrosis: An Official ATS/ERS/JRS/ALAT Clinical Practice Guideline. [place], Iss 5, [phone], Jul 09 2017. 2. Mediastinal adenopathy can be seen the setting of interstitial lung disease and appears slightly improved from 03/09/2022. 3. Possible cholelithiasis. 4. Aortic atherosclerosis (ICD10-I70.0). Coronary artery calcification. 5. Enlarged pulmonic trunk, indicative of pulmonary arterial hypertension. Electronically Signed   By: Lorin Picket M.D.   On: 04/19/2022 11:22    Recent Labs: Lab Results  Component Value Date   WBC 8.4 03/12/2022   HGB 10.9 (L) 03/12/2022   PLT 298 03/12/2022   NA 142 04/19/2022   K 3.8 04/19/2022   CL 102 04/19/2022   CO2 21 04/19/2022   GLUCOSE 109 (H) 04/19/2022   BUN 30 (H) 04/19/2022   CREATININE 1.32 (H) 04/19/2022   BILITOT 1.0 03/12/2022   ALKPHOS 61 03/12/2022   AST 14 (L) 03/12/2022   ALT 17 03/12/2022   PROT 7.1 03/12/2022   ALBUMIN 3.6 03/12/2022   CALCIUM 9.7 04/19/2022   GFRAA 51 (L) 04/02/2021   QFTBGOLDPLUS INDETERMINATE (A) 04/21/2022    Speciality Comments: dxd 2015 at [facility].  An inadequate response to Plaquenil, Humira, Remicade.  On methotrexate and prednisone since 2015.  Orencia added May 2021.  Prednisone 5 mg p.o. daily  Procedures:  No procedures performed Allergies: Ace inhibitors and Lisinopril   Assessment / Plan:     Visit Diagnoses: Rheumatoid arthritis with rheumatoid factor of multiple sites without organ or systems involvement (HCC)-patient states that she is doing much better on prednisone 10 mg p.o. daily.  She denies joint swelling.  She continues to have pain and discomfort in her bilateral knee joints.   She uses a wheelchair.  She states her rheumatoid nodules resolved on prednisone.  She is tolerating prednisone 10 mg p.o. daily without any side effects.  There is nothing more to offer as she cannot take any immunosuppressive agents.  High risk medication use-she was treated at [facility]  with Orencia and resume a combination in the past and had an inadequate response.  She had an inadequate response to Plaquenil, Humira and Remicade in the past.  She had frequent infections on immunosuppressive agents and immunosuppressive agents were discontinued.  Rheumatoid nodulosis (HCC)-improved  Idiopathic pulmonary fibrosis -she had high-resolution CT on April 19, 2022 which I reviewed with the patient today.  It showed: 1. Pulmonary parenchymal pattern of fibrosis may be due to fibrotic nonspecific interstitial pneumonitis. Fibrotic hypersensitivity pneumonitis is considered less likely in the absence of air trapping but is not excluded. Findings are suggestive of an alternative diagnosis (not UIP) per consensus guidelines: Diagnosis of Idiopathic Pulmonary Fibrosis: An Official ATS/ERS/JRS/ALAT Clinical Practice Guideline. [place], Iss 5, [phone], Jul 09 2017. 2. Mediastinal adenopathy can be seen the setting of interstitial lung disease and appears slightly improved from 03/09/2022. 3. Possible cholelithiasis. 4. Aortic atherosclerosis (ICD10-I70.0). Coronary artery calcification. 5. Enlarged pulmonic trunk, indicative of pulmonary arterial hypertension.  Electronically Signed   By: Lorin Picket M.D.   On: 04/19/2022 11:22  Patient had a recent echocardiogram on April 22, 2022.  She has a follow-up appointment with her cardiologist.  Subacute cough-she still has chronic cough.  Current chronic use of systemic steroids-she has been on prednisone 10 mg p.o. daily which she can continue through her PCP.  Primary osteoarthritis of both hands-currently  asymptomatic.  Primary osteoarthritis of both knees-she has pain and discomfort in her bilateral knee joints due to severe osteoarthritis.  She is in a wheelchair.  Primary osteoarthritis of both feet-denies any discomfort today.  Hyperuricemia-patient states that she was placed on allopurinol at 1 time due to hyperuricemia.  She never had a gout flare.  Other medical problems listed as follows:  Chronic diastolic CHF (congestive heart failure) (HCC)  Chronic atrial fibrillation ([place])  Essential hypertension  Type 2 diabetes mellitus with hyperglycemia, with long-term current use of insulin (HCC)  Vitamin D deficiency  OSA (obstructive sleep apnea)  Former smoker  Orders: No orders of the defined types were placed in this encounter.  No orders of the defined types were placed in this encounter.   Follow-Up Instructions: Return if symptoms worsen or fail to improve, for Rheumatoid arthritis.   Bo Merino, MD  Note - This record has been created using Editor, commissioning.  Chart creation errors have been sought, but may not always  have been located. Such creation errors do not reflect on  the standard of medical care.

## 2022-04-29 ENCOUNTER — Ambulatory Visit (INDEPENDENT_AMBULATORY_CARE_PROVIDER_SITE_OTHER): Payer: Medicaid Other | Admitting: Rheumatology

## 2022-04-29 ENCOUNTER — Other Ambulatory Visit: Payer: Medicaid Other

## 2022-04-29 ENCOUNTER — Encounter: Payer: Self-pay | Admitting: Rheumatology

## 2022-04-29 VITALS — BP 124/74 | HR 84 | Resp 17 | Ht 62.75 in | Wt 222.0 lb

## 2022-04-29 DIAGNOSIS — Z79899 Other long term (current) drug therapy: Secondary | ICD-10-CM | POA: Diagnosis not present

## 2022-04-29 DIAGNOSIS — M0630 Rheumatoid nodule, unspecified site: Secondary | ICD-10-CM

## 2022-04-29 DIAGNOSIS — G4733 Obstructive sleep apnea (adult) (pediatric): Secondary | ICD-10-CM

## 2022-04-29 DIAGNOSIS — R052 Subacute cough: Secondary | ICD-10-CM

## 2022-04-29 DIAGNOSIS — M19041 Primary osteoarthritis, right hand: Secondary | ICD-10-CM

## 2022-04-29 DIAGNOSIS — Z87891 Personal history of nicotine dependence: Secondary | ICD-10-CM

## 2022-04-29 DIAGNOSIS — J84112 Idiopathic pulmonary fibrosis: Secondary | ICD-10-CM

## 2022-04-29 DIAGNOSIS — Z7952 Long term (current) use of systemic steroids: Secondary | ICD-10-CM

## 2022-04-29 DIAGNOSIS — E790 Hyperuricemia without signs of inflammatory arthritis and tophaceous disease: Secondary | ICD-10-CM

## 2022-04-29 DIAGNOSIS — M19072 Primary osteoarthritis, left ankle and foot: Secondary | ICD-10-CM

## 2022-04-29 DIAGNOSIS — E1165 Type 2 diabetes mellitus with hyperglycemia: Secondary | ICD-10-CM

## 2022-04-29 DIAGNOSIS — E559 Vitamin D deficiency, unspecified: Secondary | ICD-10-CM

## 2022-04-29 DIAGNOSIS — M0579 Rheumatoid arthritis with rheumatoid factor of multiple sites without organ or systems involvement: Secondary | ICD-10-CM

## 2022-04-29 DIAGNOSIS — I482 Chronic atrial fibrillation, unspecified: Secondary | ICD-10-CM

## 2022-04-29 DIAGNOSIS — M170 Bilateral primary osteoarthritis of knee: Secondary | ICD-10-CM

## 2022-04-29 DIAGNOSIS — I10 Essential (primary) hypertension: Secondary | ICD-10-CM

## 2022-04-29 DIAGNOSIS — I5032 Chronic diastolic (congestive) heart failure: Secondary | ICD-10-CM

## 2022-04-29 DIAGNOSIS — M19042 Primary osteoarthritis, left hand: Secondary | ICD-10-CM

## 2022-04-29 DIAGNOSIS — Z794 Long term (current) use of insulin: Secondary | ICD-10-CM

## 2022-04-29 DIAGNOSIS — M1A09X1 Idiopathic chronic gout, multiple sites, with tophus (tophi): Secondary | ICD-10-CM

## 2022-04-29 DIAGNOSIS — M19071 Primary osteoarthritis, right ankle and foot: Secondary | ICD-10-CM

## 2022-04-30 LAB — BASIC METABOLIC PANEL
BUN/Creatinine Ratio: 20 (ref 12–28)
BUN: 22 mg/dL (ref 8–27)
CO2: 19 mmol/L — ABNORMAL LOW (ref 20–29)
Calcium: 9.9 mg/dL (ref 8.7–10.3)
Chloride: 102 mmol/L (ref 96–106)
Creatinine, Ser: 1.11 mg/dL — ABNORMAL HIGH (ref 0.57–1.00)
Glucose: 129 mg/dL — ABNORMAL HIGH (ref 70–99)
Potassium: 3.9 mmol/L (ref 3.5–5.2)
Sodium: 141 mmol/L (ref 134–144)
eGFR: 56 mL/min/{1.73_m2} — ABNORMAL LOW (ref >59)

## 2022-05-05 ENCOUNTER — Ambulatory Visit (INDEPENDENT_AMBULATORY_CARE_PROVIDER_SITE_OTHER): Payer: Medicaid Other | Admitting: Podiatry

## 2022-05-05 ENCOUNTER — Encounter: Payer: Self-pay | Admitting: Podiatry

## 2022-05-05 DIAGNOSIS — M79674 Pain in right toe(s): Secondary | ICD-10-CM

## 2022-05-05 DIAGNOSIS — M79675 Pain in left toe(s): Secondary | ICD-10-CM | POA: Diagnosis not present

## 2022-05-05 DIAGNOSIS — E1142 Type 2 diabetes mellitus with diabetic polyneuropathy: Secondary | ICD-10-CM | POA: Diagnosis not present

## 2022-05-05 DIAGNOSIS — B351 Tinea unguium: Secondary | ICD-10-CM

## 2022-05-12 NOTE — Progress Notes (Signed)
  Subjective:  Patient ID: Kathleen Kane, female    DOB: 1961-02-07,  MRN: [id number]  Kathleen Kane presents to clinic today for at risk foot care with history of diabetic neuropathy and thick, elongated toenails R 3rd toe which are tender when wearing enclosed shoe gear.  Last A1c was 7.4%. Patient did not check blood glucose today.  New problem(s): None.   PCP is [name], MD , and last visit was May, 2023.  Allergies  Allergen Reactions   Ace Inhibitors Cough   Lisinopril Cough   Review of Systems: Negative except as noted in the HPI.  Objective: No changes noted in today's physical examination. Kathleen Kane is a pleasant 61 y.o. female in NAD. AAO X 3.  Vascular Examination: CFT <3 seconds b/l LE. Palpable DP/PT pulses b/l LE. Digital hair absent b/l. Skin temperature gradient WNL b/l. No pain with calf compression b/l. No edema noted b/l. No cyanosis or clubbing noted b/l LE.  Dermatological Examination: Pedal integument with normal turgor, texture and tone b/l LE. No open wounds b/l. No interdigital macerations b/l. Toenails R 3rd toe elongated, thickened, discolored with subungual debris. +Tenderness with dorsal palpation of nailplates. No hyperkeratotic or porokeratotic lesions present. Anonychia noted 1-5 left, R hallux, R 2nd toe, R 4th toe, and R 5th toe. Nailbed(s) epithelialized.   Musculoskeletal Examination: Muscle strength 5/5 to all lower extremity muscle groups bilaterally. No pain, crepitus or joint limitation noted with ROM bilateral LE. Pes planus deformity noted bilateral LE. Utilizes wheelchair for mobility assistance.  Neurological Examination: Protective sensation diminished with 10g monofilament b/l.     Latest Ref Rng & Units 03/12/2022   10:00 AM 06/17/2021    2:43 PM  Hemoglobin A1C  Hemoglobin-A1c 4.8 - 5.6 % 7.4  9.2    Assessment/Plan: 1. Pain due to onychomycosis of toenails of both feet   2. Diabetic peripheral neuropathy  associated with type 2 diabetes mellitus ([place])     -Patient was evaluated and treated. All patient's and/or POA's questions/concerns answered on today's visit. -Patient to continue soft, supportive shoe gear daily. -Mycotic toenails R 3rd toe were debrided in length and girth with sterile nail nippers and dremel without iatrogenic bleeding. -Patient/POA to call should there be question/concern in the interim.   Return in about 3 months (around 08/05/2022).  Marzetta Board, DPM

## 2022-05-23 NOTE — Progress Notes (Signed)
Cardiology Office Note:    Date:  05/24/2022   ID:  Kathleen Kane, DOB Dec 29, 1960, MRN [id number]  PCP:  Fleet Contras, MD   [facility] Providers Cardiologist:  Charlton Haws, MD     Referring MD: Fleet Contras, MD   Chief Complaint: Hospital follow-up CHF  History of Present Illness:    Kathleen Kane is a very pleasant 61 y.o. female with a hx of persistent atrial fibrillation on chronic anticoagulation, chronic HFpEF, OSA, hypertension, type 2 diabetes, RA, obesity, pulmonary fibrosis, and prior tobacco abuse.  Normal Myoview 2014. Atherosclerosis noted on CT scan during hospitalization for pneumonia in 2019. 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  Carotids revealed 1 to 39% stenosis 02/15/2020.   She was last seen in our office on 12/09/2020 by Dr. Eden Emms at which time she was advised to return in 1 year.   She called our office 08/14/2021 with concerns for chest pain and shortness of breath.  She went to [facility] and was diagnosed with pneumonia.  Most recent echocardiogram 10/22/21, following CXR that showed cardiomegaly and mild interstitial edema, revealed LVEF 55 to 60%, mild LVH, unable to evaluate diastolic function, moderate MR with recommendation from Dr. Eden Emms to repeat in 1 year.   Admission 5/4-03/15/22.  Presented with shortness of breath and coughing, was treated a few days prior at [facility] for URI.  Was in the ED a few days prior with abdominal pain.  She took home Lasix and felt some improvement in symptoms of SOB. Oxygen saturations were initially reported 80%.  EKG revealed atrial fibrillation at 89 bpm.  CXR suggestive of CHF, atypical infection or pulmonary hemorrhage could not be excluded so chest CT was done which revealed extensive pulmonary fibrosis, cardiomegaly, coronary artery calcification.  She was started on antibiotics and steroids.  BP remained relatively controlled throughout hospital stay, she had mild AKI with return of normal creatinine at discharge.    She was last seen in the office by me on 04/06/2022 with her daughter for hospital follow-up for CHF, sitting in a wheelchair.  Sleeping in a recliner since hospital d/c because it is more comfortable. Able to lie flat just prefers to sleep in the recliner. Has noted palpitations on 2 occasions since d/c - feels like heart racing and on one occasion felt a big "plop."  Her daughter has been making her follow a low-sodium diet and states leg edema has greatly improved since being home. 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 She denied chest pain, lower extremity edema, fatigue, palpitations, melena, hematuria, hemoptysis, diaphoresis, weakness, presyncope, syncope, orthopnea, and PND. Echo completed 04/22/22 revealed normal LVEF, diastolic parameters indeterminate, moderate LVH, and no significant valve dysfunction.   Today, she is here with her daughter. She continues to have limited activity due to knee pain. Reports weight gain. Was advised to double Lasix if she felt congestion or swelling. She has done this on 3 or 4 occasions since we last saw her. Reports BP is low at home but she denies lightheadedness, fatigue. Reports strictly limits sodium at home but eats  "bad" once per week - fried fish, [employer], [employer]. She denies chest pain, lower extremity edema, fatigue, palpitations, melena, hematuria, hemoptysis, diaphoresis, weakness, presyncope, syncope, orthopnea, and PND.  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 AHI-6.8/hr, during REM-27.3/hr. RDI-25.0/hr, during REM-40.9/hr. 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   HAND SURGERY Right 2018   LAPAROTOMY Right 02/27/2013   Procedure: EXPLORATORY LAPAROTOMY, right salpingo-oopherectomy;  Surgeon: Laurette Schimke, MD;  Location: [facility];  Service: Gynecology;  Laterality: Right;   left breast cyst     SALPINGOOPHORECTOMY Right 02/27/2013   Procedure: SALPINGO OOPHORECTOMY;  Surgeon: Laurette Schimke, MD;  Location: [facility];  Service: Gynecology;  Laterality: Right;   TUBAL LIGATION Bilateral 1989    Current Medications: Current Meds  Medication Sig   Accu-Chek FastClix Lancets MISC Apply topically.   ACCU-CHEK GUIDE test strip    apixaban (ELIQUIS) 5 MG TABS tablet TAKE 1 TABLET BY MOUTH TWICE A DAY   atorvastatin (LIPITOR) 20 MG tablet TAKE 1 TABLET (20 MG TOTAL) BY MOUTH DAILY. PLEASE SCHEDULE YEARLY APPOINTMENT FOR FUTURE REFILLS. 1ST ATTEMPT. THANK YOU   B-D ULTRAFINE III SHORT PEN 31G X 8 MM MISC Inject into the skin 2 (two) times daily.   blood glucose meter kit and supplies Dispense based on patient and insurance preference. Use up to four times daily as directed. (FOR ICD-10 E10.9, E11.9).   ferrous  sulfate 325 (65 FE) MG tablet Take 1 tablet (325 mg total) by mouth daily with breakfast.   folic acid (FOLVITE) 1 MG tablet Take 1 mg by mouth every morning.   furosemide (LASIX) 40 MG tablet Take 40 mg by mouth daily.   levalbuterol (XOPENEX HFA) 45 MCG/ACT inhaler Inhale 1-2 puffs into the lungs every 6 (six) hours as needed for wheezing.   losartan (COZAAR) 25 MG tablet TAKE 1 TABLET BY MOUTH EVERY DAY   metFORMIN (GLUCOPHAGE) 500 MG tablet Take 2 tablets (1,000 mg total) by mouth 2 (two) times daily.   metoprolol tartrate (LOPRESSOR) 25 MG tablet Take 1 tablet (25 mg total) by mouth 2 (two) times daily.   NOVOLOG MIX 70/30 FLEXPEN (70-30) 100 UNIT/ML FlexPen Inject 0.4 mLs (40 Units total) into the skin 2 (two) times daily with a meal. (Patient taking differently: Inject 40-80 Units into the skin See admin instructions. Inject 40-80 units subcutaneously once or twice daily with meals - dose is based on CBG and carbs)   potassium chloride (KLOR-CON M) 10 MEQ tablet Take 1 tablet (10 mEq total) by mouth 2 (two) times daily.   predniSONE (DELTASONE) 10  MG tablet Takes 6 tablets for 1 days, then 5 tablets for 1 days, then 4 tablets for 1 days, then 3 tablets for 1 days, then 2 tabs for 1 days, then 1 tab for 1 days, and then stop. (Patient taking differently: Take 10 mg by mouth daily.)   spironolactone (ALDACTONE) 25 MG tablet Take 0.5 tablets (12.5 mg total) by mouth daily.   [DISCONTINUED] furosemide (LASIX) 40 MG tablet TAKE 1 TABLET BY MOUTH TWICE A DAY (Patient taking differently: Take 40 mg by mouth daily.)     Allergies:   Ace inhibitors and Lisinopril   Social History   Socioeconomic History   Marital status: Married    Spouse name: Renteria,Theodore   Number of children: 2   Years of education: Not on file   Highest education level: Not on file  Occupational History   Not on file  Tobacco Use   Smoking status: Former    Packs/day: 1.00    Years: 36.00    Total pack years:  36.00    Types: Cigarettes    Start date: 74    Quit date: 06/08/2014    Years since quitting: 7.9    Passive exposure: Never   Smokeless tobacco: Never  Vaping Use   Vaping Use: Former  Substance and Sexual Activity   Alcohol use: No    Alcohol/week: 0.0 standard drinks of alcohol   Drug use: No   Sexual activity: Yes    Partners: Male  Other Topics Concern   Not on file  Social History Narrative   Not on file   Social Determinants of Health   Financial Resource Strain: Not on file  Food Insecurity: Not on file  Transportation Needs: Not on file  Physical Activity: Not on file  Stress: Not on file  Social Connections: Not on file     Family History: The patient's family history includes Autoimmune disease in her daughter; Diabetes in her brother, mother, paternal grandmother, and sister; Healthy in her son; Heart Problems in her sister; Hypertension in her brother, father, and mother; Kidney failure in her sister; Throat cancer in her mother.  ROS:   Please see the history of present illness.  All other systems reviewed and are negative.  Labs/Other Studies Reviewed:    The following studies were reviewed today:  Echo 10/22/21  1. Possible restricted posterior MV leaflet; moderate MR; moderate TR.   2. Left ventricular ejection fraction, by estimation, is 55 to 60%. The  left ventricle has normal function. The left ventricle has no regional  wall motion abnormalities. The left ventricular internal cavity size was  mildly dilated. There is mild left  ventricular hypertrophy. Left ventricular diastolic function could not be  evaluated.   3. Right ventricular systolic function is normal. The right ventricular  size is mildly enlarged. There is moderately elevated pulmonary artery  systolic pressure.   4. Left atrial size was mildly dilated.   5. Right atrial size was mildly dilated.   6. The mitral valve is normal in structure. Moderate mitral valve   regurgitation. No evidence of mitral stenosis.   7. Tricuspid valve regurgitation is moderate.   8. The aortic valve is tricuspid. Aortic valve regurgitation is not  visualized. No aortic stenosis is present.   9. The inferior vena cava is dilated in size with >50% respiratory  variability, suggesting right atrial pressure of 8 mmHg.   Comparison(s): 02/26/20 EF 55-60%. PA pressure .   VAS Carotid U/S 02/16/20  Right Carotid: Velocities in the right ICA are consistent with a 1-39%  stenosis.                The RICA velocities remain within normal range and stable                 compared to the prior exam.   Left Carotid: Velocities in the left ICA are consistent with a 1-39%  stenosis.               Non-hemodynamically significant plaque <50% noted in the  CCA. The                LICA velocities remain within normal range and have  increased               compared to the prior exam.   Vertebrals:  Bilateral vertebral arteries demonstrate antegrade flow.  Subclavians: Left subclavian artery flow was disturbed. Normal flow  hemodynamics              were seen in the right subclavian artery.    Myoview 02/16/13  Exercise Capacity:  Lexiscan with no exercise. BP Response:  Normal blood pressure response. Clinical Symptoms:  No significant symptoms noted. ECG Impression:  No significant ECG changes with Lexiscan. LV Wall Motion:  NL LV Function; NL Wall Motion Comparison with Prior Nuclear Study: No significant change from previous study   Overall Impression:  Normal stress nuclear study.  Low risk stress nuclear study.    Recent Labs: 03/11/2022: B Natriuretic Peptide 127.8 03/12/2022: ALT 17; Hemoglobin 10.9; Magnesium 2.1; Platelets 298 04/08/2022: Pro B Natriuretic peptide (BNP) 169.0 04/29/2022: BUN 22; Creatinine, Ser 1.11; Potassium 3.9; Sodium 141  Recent Lipid Panel    Component Value Date/Time   CHOL 108 03/09/2021 1602   CHOL 100 07/21/2020 1005   TRIG 81  03/09/2021 1602   HDL 38 (L) 03/09/2021 1602   HDL 29 (L) 07/21/2020 1005   CHOLHDL 2.8 03/09/2021 1602   VLDL 28 01/24/2008 0330   LDLCALC 54 03/09/2021 1602     Risk Assessment/Calculations:     CHA2DS2-VASc Score = 4  The patient's score is based upon: CHF History: 1 HTN History: 1 Diabetes History: 1 Stroke History: 0 Vascular Disease History: 0 Age Score: 0 Gender Score: 1      Physical Exam:    VS:  BP 108/60   Pulse 76   Ht 5\' 2"  (1.575 m)   Wt 225 lb 3.2 oz (102.2 kg)   LMP 10/28/2013   SpO2 97%   BMI 41.19 kg/m     Wt Readings from Last 3 Encounters:  05/24/22 225 lb 3.2 oz (102.2 kg)  04/29/22 222 lb (100.7 kg)  04/19/22 218 lb (98.9 kg)     GEN: Well developed, obese female in no acute distress HEENT: Normal NECK: No JVD; No carotid bruits CARDIAC: IRR, no murmurs, rubs, gallops RESPIRATORY:  Diminished breath sounds bilaterally  ABDOMEN: Soft, non-tender, non-distended MUSCULOSKELETAL:  No edema; No deformity. 2+ pedal pulses, equal bilaterally SKIN: Warm and dry NEUROLOGIC:  Alert and oriented x 3 PSYCHIATRIC:  Normal affect   EKG:  EKG is not ordered today.   Diagnoses:    1. Chronic heart failure with preserved ejection fraction (HFpEF) (HCC)   2. Mitral valve insufficiency, unspecified etiology   3. Longstanding persistent atrial fibrillation (HCC)   4. Essential hypertension   5. Hyperlipidemia LDL goal <70   6. Aortic atherosclerosis (HCC)   7.  Chronic anticoagulation     Assessment and Plan:     Chronic HFpEF: Reports a 4 lb weight gain recently. Does not appear volume overloaded today on exam. Activity is limited by arthritis. Denies dyspnea, edema, orthopnea, PND. Wonders if weight gain is 2/2 decrease in diuretic, however this was several months ago.  She is not monitoring caloric intake.  Advised her to monitor calories with something similar to My Fitness Pal app. Advised her to monitor weight on a daily basis and to take an  additional 40 mg of Lasix if weight increases > 2 LB in 24 hours or > 5 LB in a week.  She is restricting fluids to 2 L daily. Continue low-sodium diet. Will check bmet today.   Persistent AF on chronic anticoagulation: HR is well controlled. Few palpitations but otherwise asymptomatic.  No bleeding problems on Eliquis.  Mitral regurgitation: Mild mitral valve regurgitation by echo 04/22/22. Will continue to monitor on consistent basis.   Hypertension: BP is well-controlled today. Reports recent low recently BP at home. Denies lightheadedness, fatigue.  Encouraged her to increase physical activity and continue to limit sodium intake.   Aortic atherosclerosis/Hyperlipidemia LDL goal < 70: LDL 54 on 03/09/21. We will recheck lipid and hepatic function today. Continue atorvastatin.      Disposition: Keep your October appointment with Dr. Eden Emms  Medication Adjustments/Labs and Tests Ordered: Current medicines are reviewed at length with the patient today.  Concerns regarding medicines are outlined above.  Orders Placed This Encounter  Procedures   Comp Met (CMET)   CBC   Lipid Profile   No orders of the defined types were placed in this encounter.   Patient Instructions  Medication Instructions:   If you gain 2 lbs in 24 hours or 5 lbs in one week take extra ( 40 mg) tablet by mouth of Lasix.   *If you need a refill on your cardiac medications before your next appointment, please call your pharmacy*   Lab Work:  TODAY!!!!! CMET/LIPID/CBC  If you have labs (blood work) drawn today and your tests are completely normal, you will receive your results only by: MyChart Message (if you have MyChart) OR A paper copy in the mail If you have any lab test that is abnormal or we need to change your treatment, we will call you to review the results.   Testing/Procedures:  None ordered.   Follow-Up: At [facility], you and your health needs are our priority.  As part of our continuing  mission to provide you with exceptional heart care, we have created designated Provider Care Teams.  These Care Teams include your primary Cardiologist (physician) and Advanced Practice Providers (APPs -  Physician Assistants and Nurse Practitioners) who all work together to provide you with the care you need, when you need it.  We recommend signing up for the patient portal called "MyChart".  Sign up information is provided on this After Visit Summary.  MyChart is used to connect with patients for Virtual Visits (Telemedicine).  Patients are able to view lab/test results, encounter notes, upcoming appointments, etc.  Non-urgent messages can be sent to your provider as well.   To learn more about what you can do with MyChart, go to [email or web address].    Your next appointment:   3 month(s)  The format for your next appointment:   In Person  Provider:   Charlton Haws, MD     Other Instructions  Adopting a Healthy Lifestyle.   Weight: Know what  a healthy weight is for you (roughly BMI <25) and aim to maintain this. You can calculate your body mass index on your smart phone  Diet: Aim for 7+ servings of fruits and vegetables daily Limit animal fats in diet for cholesterol and heart health - choose grass fed whenever available Avoid highly processed foods (fast food burgers, tacos, fried chicken, pizza, hot dogs, french fries)  Saturated fat comes in the form of butter, lard, coconut oil, margarine, partially hydrogenated oils, and fat in meat. These increase your risk of cardiovascular disease.  Use healthy plant oils, such as olive, canola, soy, corn, sunflower and peanut.  Whole foods such as fruits, vegetables and whole grains have fiber  Men need > 38 grams of fiber per day Women need > 25 grams of fiber per day  Load up on vegetables and fruits - one-half of your plate: Aim for color and variety, and remember that potatoes dont count. Go for whole grains - one-quarter of your  plate: Whole wheat, barley, wheat berries, quinoa, oats, brown rice, and foods made with them. If you want pasta, go with whole wheat pasta. Protein power - one-quarter of your plate: Fish, chicken, beans, and nuts are all healthy, versatile protein sources. Limit red meat. You need carbohydrates for energy! The type of carbohydrate is more important than the amount. Choose carbohydrates such as vegetables, fruits, whole grains, beans, and nuts in the place of white rice, white pasta, potatoes (baked or fried), macaroni and cheese, cakes, cookies, and donuts.  If youre thirsty, drink water. Coffee and tea are good in moderation, but skip sugary drinks and limit milk and dairy products to one or two daily servings. Keep sugar intake at 6 teaspoons or 24 grams or LESS       Exercise: Aim for 150 min of moderate intensity exercise weekly for heart health, and weights twice weekly for bone health Stay active - any steps are better than no steps! Aim for 7-9 hours of sleep daily      Recommend weighing daily and keeping a log. if you have weight gain of 2 pounds overnight or 5 pounds in 1 week take extra tablet by mouth ( 40 mg ) of lasix.   Date  Time Weight                                            Mediterranean Diet A Mediterranean diet refers to food and lifestyle choices that are based on the traditions of countries located on the [employer]. It focuses on eating more fruits, vegetables, whole grains, beans, nuts, seeds, and heart-healthy fats, and eating less dairy, meat, eggs, and processed foods with added sugar, salt, and fat. This way of eating has been shown to help prevent certain conditions and improve outcomes for people who have chronic diseases, like kidney disease and heart disease. What are tips for following this plan? Reading food labels Check the serving size of packaged foods. For foods such as rice and pasta, the serving size refers to the amount of  cooked product, not dry. Check the total fat in packaged foods. Avoid foods that have saturated fat or trans fats. Check the ingredient list for added sugars, such as corn syrup. Shopping  Buy a variety of foods that offer a balanced diet, including: Fresh fruits and vegetables (produce). Grains, beans, nuts, and seeds. Some  of these may be available in unpackaged forms or large amounts (in bulk). Fresh seafood. Poultry and eggs. Low-fat dairy products. Buy whole ingredients instead of prepackaged foods. Buy fresh fruits and vegetables in-season from local farmers markets. Buy plain frozen fruits and vegetables. If you do not have access to quality fresh seafood, buy precooked frozen shrimp or canned fish, such as tuna, salmon, or sardines. Stock your pantry so you always have certain foods on hand, such as olive oil, canned tuna, canned tomatoes, rice, pasta, and beans. Cooking Cook foods with extra-virgin olive oil instead of using butter or other vegetable oils. Have meat as a side dish, and have vegetables or grains as your main dish. This means having meat in small portions or adding small amounts of meat to foods like pasta or stew. Use beans or vegetables instead of meat in common dishes like chili or lasagna. Experiment with different cooking methods. Try roasting, broiling, steaming, and sauting vegetables. Add frozen vegetables to soups, stews, pasta, or rice. Add nuts or seeds for added healthy fats and plant protein at each meal. You can add these to yogurt, salads, or vegetable dishes. Marinate fish or vegetables using olive oil, lemon juice, garlic, and fresh herbs. Meal planning Plan to eat one vegetarian meal one day each week. Try to work up to two vegetarian meals, if possible. Eat seafood two or more times a week. Have healthy snacks readily available, such as: Vegetable sticks with hummus. Greek yogurt. Fruit and nut trail mix. Eat balanced meals throughout the  week. This includes: Fruit: 2-3 servings a day. Vegetables: 4-5 servings a day. Low-fat dairy: 2 servings a day. Fish, poultry, or lean meat: 1 serving a day. Beans and legumes: 2 or more servings a week. Nuts and seeds: 1-2 servings a day. Whole grains: 6-8 servings a day. Extra-virgin olive oil: 3-4 servings a day. Limit red meat and sweets to only a few servings a month. Lifestyle  Cook and eat meals together with your family, when possible. Drink enough fluid to keep your urine pale yellow. Be physically active every day. This includes: Aerobic exercise like running or swimming. Leisure activities like gardening, walking, or housework. Get 7-8 hours of sleep each night. If recommended by your health care provider, drink red wine in moderation. This means 1 glass a day for nonpregnant women and 2 glasses a day for men. A glass of wine equals 5 oz (150 mL). What foods should I eat? Fruits Apples. Apricots. Avocado. Berries. Bananas. Cherries. Dates. Figs. Grapes. Lemons. Melon. Oranges. Peaches. Plums. Pomegranate. Vegetables Artichokes. Beets. Broccoli. Cabbage. Carrots. Eggplant. Green beans. Chard. Kale. Spinach. Onions. Leeks. Peas. Squash. Tomatoes. Peppers. Radishes. Grains Whole-grain pasta. Brown rice. Bulgur wheat. Polenta. Couscous. Whole-wheat bread. Orpah Cobb. Meats and other proteins Beans. Almonds. Sunflower seeds. Pine nuts. Peanuts. Cod. Salmon. Scallops. Shrimp. Tuna. Tilapia. Clams. Oysters. Eggs. Poultry without skin. Dairy Low-fat milk. Cheese. Greek yogurt. Fats and oils Extra-virgin olive oil. Avocado oil. Grapeseed oil. Beverages Water. Red wine. Herbal tea. Sweets and desserts Greek yogurt with honey. Baked apples. Poached pears. Trail mix. Seasonings and condiments Basil. Cilantro. Coriander. Cumin. Mint. Parsley. Sage. Rosemary. Tarragon. Garlic. Oregano. Thyme. Pepper. Balsamic vinegar. Tahini. Hummus. Tomato sauce. Olives. Mushrooms. The items  listed above may not be a complete list of foods and beverages you can eat. Contact a dietitian for more information. What foods should I limit? This is a list of foods that should be eaten rarely or only on special occasions. Fruits  Fruit canned in syrup. Vegetables Deep-fried potatoes (french fries). Grains Prepackaged pasta or rice dishes. Prepackaged cereal with added sugar. Prepackaged snacks with added sugar. Meats and other proteins Beef. Pork. Lamb. Poultry with skin. Hot dogs. Tomasa Blase. Dairy Ice cream. Sour cream. Whole milk. Fats and oils Butter. Canola oil. Vegetable oil. Beef fat (tallow). Lard. Beverages Juice. Sugar-sweetened soft drinks. Beer. Liquor and spirits. Sweets and desserts Cookies. Cakes. Pies. Candy. Seasonings and condiments Mayonnaise. Pre-made sauces and marinades. The items listed above may not be a complete list of foods and beverages you should limit. Contact a dietitian for more information. Summary The Mediterranean diet includes both food and lifestyle choices. Eat a variety of fresh fruits and vegetables, beans, nuts, seeds, and whole grains. Limit the amount of red meat and sweets that you eat. If recommended by your health care provider, drink red wine in moderation. This means 1 glass a day for nonpregnant women and 2 glasses a day for men. A glass of wine equals 5 oz (150 mL). This information is not intended to replace advice given to you by your health care provider. Make sure you discuss any questions you have with your health care provider. Document Revised: 11/30/2019 Document Reviewed: 09/27/2019 Elsevier Patient Education  2023 Elsevier Inc.  Please get the APP on your phone ( MY [employer] PAL).   Important Information About Sugar         Signed, Levi Aland, NP  05/24/2022 8:56 PM    [facility]

## 2022-05-24 ENCOUNTER — Ambulatory Visit (INDEPENDENT_AMBULATORY_CARE_PROVIDER_SITE_OTHER): Payer: Medicaid Other | Admitting: Nurse Practitioner

## 2022-05-24 ENCOUNTER — Encounter: Payer: Self-pay | Admitting: Nurse Practitioner

## 2022-05-24 VITALS — BP 108/60 | HR 76 | Ht 62.0 in | Wt 225.2 lb

## 2022-05-24 DIAGNOSIS — Z7901 Long term (current) use of anticoagulants: Secondary | ICD-10-CM

## 2022-05-24 DIAGNOSIS — I700 Atherosclerosis of aorta: Secondary | ICD-10-CM

## 2022-05-24 DIAGNOSIS — I5032 Chronic diastolic (congestive) heart failure: Secondary | ICD-10-CM | POA: Diagnosis not present

## 2022-05-24 DIAGNOSIS — I340 Nonrheumatic mitral (valve) insufficiency: Secondary | ICD-10-CM

## 2022-05-24 DIAGNOSIS — I10 Essential (primary) hypertension: Secondary | ICD-10-CM | POA: Diagnosis not present

## 2022-05-24 DIAGNOSIS — I4811 Longstanding persistent atrial fibrillation: Secondary | ICD-10-CM

## 2022-05-24 DIAGNOSIS — E785 Hyperlipidemia, unspecified: Secondary | ICD-10-CM

## 2022-05-24 NOTE — Patient Instructions (Signed)
Medication Instructions:   If you gain 2 lbs in 24 hours or 5 lbs in one week take extra ( 40 mg) tablet by mouth of Lasix.   *If you need a refill on your cardiac medications before your next appointment, please call your pharmacy*   Lab Work:  TODAY!!!!! CMET/LIPID/CBC  If you have labs (blood work) drawn today and your tests are completely normal, you will receive your results only by: [place] (if you have MyChart) OR A paper copy in the mail If you have any lab test that is abnormal or we need to change your treatment, we will call you to review the results.   Testing/Procedures:  None ordered.   Follow-Up: At [facility], you and your health needs are our priority.  As part of our continuing mission to provide you with exceptional heart care, we have created designated Provider Care Teams.  These Care Teams include your primary Cardiologist (physician) and Advanced Practice Providers (APPs -  Physician Assistants and Nurse Practitioners) who all work together to provide you with the care you need, when you need it.  We recommend signing up for the patient portal called "MyChart".  Sign up information is provided on this After Visit Summary.  MyChart is used to connect with patients for Virtual Visits (Telemedicine).  Patients are able to view lab/test results, encounter notes, upcoming appointments, etc.  Non-urgent messages can be sent to your provider as well.   To learn more about what you can do with MyChart, go to [email or web address].    Your next appointment:   3 month(s)  The format for your next appointment:   In Person  Provider:   Jenkins Rouge, MD     Other Instructions  Adopting a Healthy Lifestyle.   Weight: Know what a healthy weight is for you (roughly BMI <25) and aim to maintain this. You can calculate your body mass index on your smart phone  Diet: Aim for 7+ servings of fruits and vegetables daily Limit animal fats in diet for  cholesterol and heart health - choose grass fed whenever available Avoid highly processed foods (fast food burgers, tacos, fried chicken, pizza, hot dogs, french fries)  Saturated fat comes in the form of butter, lard, coconut oil, margarine, partially hydrogenated oils, and fat in meat. These increase your risk of cardiovascular disease.  Use healthy plant oils, such as olive, canola, soy, corn, sunflower and peanut.  Whole foods such as fruits, vegetables and whole grains have fiber  Men need > 38 grams of fiber per day Women need > 25 grams of fiber per day  Load up on vegetables and fruits - one-half of your plate: Aim for color and variety, and remember that potatoes dont count. Go for whole grains - one-quarter of your plate: Whole wheat, barley, wheat berries, quinoa, oats, brown rice, and foods made with them. If you want pasta, go with whole wheat pasta. Protein power - one-quarter of your plate: Fish, chicken, beans, and nuts are all healthy, versatile protein sources. Limit red meat. You need carbohydrates for energy! The type of carbohydrate is more important than the amount. Choose carbohydrates such as vegetables, fruits, whole grains, beans, and nuts in the place of white rice, white pasta, potatoes (baked or fried), macaroni and cheese, cakes, cookies, and donuts.  If youre thirsty, drink water. Coffee and tea are good in moderation, but skip sugary drinks and limit milk and dairy products to one or two daily servings. Keep sugar  intake at 6 teaspoons or 24 grams or LESS       Exercise: Aim for 150 min of moderate intensity exercise weekly for heart health, and weights twice weekly for bone health Stay active - any steps are better than no steps! Aim for 7-9 hours of sleep daily      Recommend weighing daily and keeping a log. if you have weight gain of 2 pounds overnight or 5 pounds in 1 week take extra tablet by mouth ( 40 mg ) of lasix.   Date  Time Weight                                             Mediterranean Diet A Mediterranean diet refers to food and lifestyle choices that are based on the traditions of countries located on the [employer]. It focuses on eating more fruits, vegetables, whole grains, beans, nuts, seeds, and heart-healthy fats, and eating less dairy, meat, eggs, and processed foods with added sugar, salt, and fat. This way of eating has been shown to help prevent certain conditions and improve outcomes for people who have chronic diseases, like kidney disease and heart disease. What are tips for following this plan? Reading food labels Check the serving size of packaged foods. For foods such as rice and pasta, the serving size refers to the amount of cooked product, not dry. Check the total fat in packaged foods. Avoid foods that have saturated fat or trans fats. Check the ingredient list for added sugars, such as corn syrup. Shopping  Buy a variety of foods that offer a balanced diet, including: Fresh fruits and vegetables (produce). Grains, beans, nuts, and seeds. Some of these may be available in unpackaged forms or large amounts (in bulk). Fresh seafood. Poultry and eggs. Low-fat dairy products. Buy whole ingredients instead of prepackaged foods. Buy fresh fruits and vegetables in-season from local farmers markets. Buy plain frozen fruits and vegetables. If you do not have access to quality fresh seafood, buy precooked frozen shrimp or canned fish, such as tuna, salmon, or sardines. Stock your pantry so you always have certain foods on hand, such as olive oil, canned tuna, canned tomatoes, rice, pasta, and beans. Cooking Cook foods with extra-virgin olive oil instead of using butter or other vegetable oils. Have meat as a side dish, and have vegetables or grains as your main dish. This means having meat in small portions or adding small amounts of meat to foods like pasta or stew. Use beans or vegetables  instead of meat in common dishes like chili or lasagna. Experiment with different cooking methods. Try roasting, broiling, steaming, and sauting vegetables. Add frozen vegetables to soups, stews, pasta, or rice. Add nuts or seeds for added healthy fats and plant protein at each meal. You can add these to yogurt, salads, or vegetable dishes. Marinate fish or vegetables using olive oil, lemon juice, garlic, and fresh herbs. Meal planning Plan to eat one vegetarian meal one day each week. Try to work up to two vegetarian meals, if possible. Eat seafood two or more times a week. Have healthy snacks readily available, such as: Vegetable sticks with hummus. Greek yogurt. Fruit and nut trail mix. Eat balanced meals throughout the week. This includes: Fruit: 2-3 servings a day. Vegetables: 4-5 servings a day. Low-fat dairy: 2 servings a day. Fish, poultry, or lean meat: 1 serving a  day. Beans and legumes: 2 or more servings a week. Nuts and seeds: 1-2 servings a day. Whole grains: 6-8 servings a day. Extra-virgin olive oil: 3-4 servings a day. Limit red meat and sweets to only a few servings a month. Lifestyle  Cook and eat meals together with your family, when possible. Drink enough fluid to keep your urine pale yellow. Be physically active every day. This includes: Aerobic exercise like running or swimming. Leisure activities like gardening, walking, or housework. Get 7-8 hours of sleep each night. If recommended by your health care provider, drink red wine in moderation. This means 1 glass a day for nonpregnant women and 2 glasses a day for men. A glass of wine equals 5 oz (150 mL). What foods should I eat? Fruits Apples. Apricots. Avocado. Berries. Bananas. Cherries. Dates. Figs. Grapes. Lemons. Melon. Oranges. Peaches. Plums. Pomegranate. Vegetables Artichokes. Beets. Broccoli. Cabbage. Carrots. Eggplant. Green beans. Chard. Kale. Spinach. Onions. Leeks. Peas. Squash. Tomatoes.  Peppers. Radishes. Grains Whole-grain pasta. Brown rice. Bulgur wheat. Polenta. Couscous. Whole-wheat bread. [place]. Meats and other proteins Beans. Almonds. Sunflower seeds. Pine nuts. Peanuts. [place]. Salmon. Scallops. Shrimp. [place]. Tilapia. Clams. Oysters. Eggs. Poultry without skin. Dairy Low-fat milk. Cheese. Greek yogurt. Fats and oils Extra-virgin olive oil. Avocado oil. Grapeseed oil. Beverages Water. Red wine. Herbal tea. Sweets and desserts Greek yogurt with honey. Baked apples. Poached pears. Trail mix. Seasonings and condiments Basil. Cilantro. Coriander. Cumin. Mint. Parsley. Sage. Rosemary. Tarragon. Garlic. Oregano. Thyme. Pepper. Balsamic vinegar. Tahini. Hummus. Tomato sauce. Olives. Mushrooms. The items listed above may not be a complete list of foods and beverages you can eat. Contact a dietitian for more information. What foods should I limit? This is a list of foods that should be eaten rarely or only on special occasions. Fruits Fruit canned in syrup. Vegetables Deep-fried potatoes (french fries). Grains Prepackaged pasta or rice dishes. Prepackaged cereal with added sugar. Prepackaged snacks with added sugar. Meats and other proteins Beef. Pork. Lamb. Poultry with skin. Hot dogs. Bernie[place] Salines. Dairy Ice cream. Sour cream. Whole milk. Fats and oils Butter. Canola oil. Vegetable oil. Beef fat (tallow). Lard. Beverages Juice. Sugar-sweetened soft drinks. Beer. Liquor and spirits. Sweets and desserts Cookies. Cakes. Pies. Candy. Seasonings and condiments Mayonnaise. Pre-made sauces and marinades. The items listed above may not be a complete list of foods and beverages you should limit. Contact a dietitian for more information. Summary The Mediterranean diet includes both food and lifestyle choices. Eat a variety of fresh fruits and vegetables, beans, nuts, seeds, and whole grains. Limit the amount of red meat and sweets that you eat. If recommended by your  health care provider, drink red wine in moderation. This means 1 glass a day for nonpregnant women and 2 glasses a day for men. A glass of wine equals 5 oz (150 mL). This information is not intended to replace advice given to you by your health care provider. Make sure you discuss any questions you have with your health care provider. Document Revised: 11/30/2019 Document Reviewed: 09/27/2019 Elsevier Patient Education  [place].  Please get the APP on your phone ( [facility] [employer]).   Important Information About Sugar

## 2022-05-25 LAB — COMPREHENSIVE METABOLIC PANEL
ALT: 12 IU/L (ref 0–32)
AST: 14 IU/L (ref 0–40)
Albumin/Globulin Ratio: 1.7 (ref 1.2–2.2)
Albumin: 4.3 g/dL (ref 3.9–4.9)
Alkaline Phosphatase: 57 IU/L (ref 44–121)
BUN/Creatinine Ratio: 31 — ABNORMAL HIGH (ref 12–28)
BUN: 29 mg/dL — ABNORMAL HIGH (ref 8–27)
Bilirubin Total: 0.4 mg/dL (ref 0.0–1.2)
CO2: 23 mmol/L (ref 20–29)
Calcium: 9.9 mg/dL (ref 8.7–10.3)
Chloride: 106 mmol/L (ref 96–106)
Creatinine, Ser: 0.95 mg/dL (ref 0.57–1.00)
Globulin, Total: 2.6 g/dL (ref 1.5–4.5)
Glucose: 138 mg/dL — ABNORMAL HIGH (ref 70–99)
Potassium: 4.1 mmol/L (ref 3.5–5.2)
Sodium: 143 mmol/L (ref 134–144)
Total Protein: 6.9 g/dL (ref 6.0–8.5)
eGFR: 67 mL/min/{1.73_m2} (ref >59)

## 2022-05-25 LAB — CBC
Hematocrit: 35.5 % (ref 34.0–46.6)
Hemoglobin: 11.9 g/dL (ref 11.1–15.9)
MCH: 27.3 pg (ref 26.6–33.0)
MCHC: 33.5 g/dL (ref 31.5–35.7)
MCV: 81 fL (ref 79–97)
Platelets: 299 10*3/uL (ref 150–450)
RBC: 4.36 x10E6/uL (ref 3.77–5.28)
RDW: 15.3 % (ref 11.7–15.4)
WBC: 8.2 10*3/uL (ref 3.4–10.8)

## 2022-05-25 LAB — LIPID PANEL
Chol/HDL Ratio: 2.8 ratio (ref 0.0–4.4)
Cholesterol, Total: 137 mg/dL (ref 100–199)
HDL: 49 mg/dL (ref >39)
LDL Chol Calc (NIH): 71 mg/dL (ref 0–99)
Triglycerides: 88 mg/dL (ref 0–149)
VLDL Cholesterol Cal: 17 mg/dL (ref 5–40)

## 2022-05-31 ENCOUNTER — Ambulatory Visit (HOSPITAL_COMMUNITY)
Admission: RE | Admit: 2022-05-31 | Discharge: 2022-05-31 | Disposition: A | Discharge status: Home / Self Care | Payer: Medicaid Other | Source: Ambulatory Visit | Attending: Emergency Medicine | Admitting: Emergency Medicine

## 2022-05-31 ENCOUNTER — Encounter (HOSPITAL_COMMUNITY): Payer: Self-pay

## 2022-05-31 VITALS — BP 115/72 | HR 81 | Temp 99.2°F | Resp 18

## 2022-05-31 DIAGNOSIS — M069 Rheumatoid arthritis, unspecified: Secondary | ICD-10-CM | POA: Diagnosis not present

## 2022-05-31 MED ORDER — PREDNISONE 10 MG (21) PO TBPK: ORAL_TABLET | Freq: Every day | ORAL | 0 refills | Status: DC

## 2022-05-31 NOTE — ED Triage Notes (Signed)
Patient states that she has RA and is currently have a flare.  She is having pain all over from her shoulders down to her ankles.  Patient asking for a prednisone taper dose pack.

## 2022-05-31 NOTE — ED Provider Notes (Signed)
[facility]    CSN: [id number] Arrival date & time: 05/31/22  1459      History   Chief Complaint Chief Complaint  Patient presents with   Appointment    HPI Kathleen Kane is a 61 y.o. female.   Patient presents with generalized joint pain that has been occurring intermittently for the last few "months".  Endorses a rheumatoid neuritis flare.  Was taking daily prednisone 10 mg but has not taken medication in at least a month due to insurance issues, medication will be refilled on August 5, managed by her PCP, also followed by rheumatology.  Patient endorses that she was stopped off all rheumatology medications due to recurrent infections.  Has attempted use of supportive care such as dietary changes, swimming, stretching but this is all been ineffective in managing her pain.  Requesting steroid taper.  Denies any new injury or trauma, numbness or tingling.   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 AHI-6.8/hr, during REM-27.3/hr. RDI-25.0/hr, during REM-40.9/hr. 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   Dysphagia 01/26/2022   Personal history of colonic polyps 01/26/2022   Aortic atherosclerosis ([place]) 05/13/2021   Acute otalgia, right 11/26/2020   Arthralgia of right temporomandibular joint 11/26/2020   Bilateral impacted cerumen 11/26/2020   HCAP (healthcare-associated pneumonia) 10/02/2018   OSA (obstructive sleep apnea) 02/10/2017   Carpal tunnel syndrome, right 12/15/2016   Extensor tenosynovitis of wrist, right 12/15/2016   Bilateral primary osteoarthritis of knee 10/08/2015   Rheumatoid arthritis, 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   Acute on chronic diastolic CHF (congestive heart failure) ([place]) 08/08/2015   Morbid obesity ([place]) 05/06/2015   Essential hypertension 04/10/2015   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   Primary osteoarthritis involving multiple joints 04/09/2015   Elevated uric acid in blood 01/23/2014   High risk medication use 01/23/2014   Numbness and tingling in left hand 10/01/2013   Tobacco abuse 10/01/2013   Elevated CA-125 02/06/2013   Pelvic mass in female 01/29/2013   Diabetes mellitus ([place]) 08/10/2012   Atrial fibrillation (HCC)    Chronic diastolic CHF (congestive heart failure) ([place])     Past Surgical History:  Procedure Laterality Date   CARDIOVASCULAR STRESS TEST  02/16/2013   no significant EKG changes with Lexiscan, normal  LV function and normal wall function   CESAREAN SECTION     x2   DOPPLER ECHOCARDIOGRAPHY  01/24/2008   EF 55-60%, no diagnostic evidence of LV wall motion abnormalities, LV wall thickness was mild-moderately increased.   HAND SURGERY Right 2018   LAPAROTOMY Right 02/27/2013   Procedure: EXPLORATORY LAPAROTOMY,  right salpingo-oopherectomy;  Surgeon: [name], MD;  Location: [facility];  Service: Gynecology;  Laterality: Right;   left breast cyst     SALPINGOOPHORECTOMY Right 02/27/2013   Procedure: SALPINGO OOPHORECTOMY;  Surgeon: [name], MD;  Location: [facility];  Service: Gynecology;  Laterality: Right;   TUBAL LIGATION Bilateral 1989    OB History   No obstetric history on file.      Home Medications    Prior to Admission medications   Medication Sig Start Date End Date Taking? Authorizing Provider  Accu-Chek FastClix Lancets MISC Apply topically. 08/04/20  Yes [provider]  ACCU-CHEK GUIDE test strip  08/14/20  Yes [provider]  apixaban (ELIQUIS) 5 MG TABS tablet TAKE 1 TABLET BY MOUTH TWICE A DAY 01/11/22  Yes Josue Hector, MD  atorvastatin (LIPITOR) 20 MG tablet TAKE 1 TABLET (20 MG TOTAL) BY MOUTH DAILY. PLEASE SCHEDULE YEARLY APPOINTMENT FOR FUTURE REFILLS. 1ST ATTEMPT. THANK YOU 01/29/22  Yes Josue Hector, MD  B-D ULTRAFINE III SHORT PEN 31G X 8 MM MISC Inject into the skin 2 (two) times daily. 06/30/20  Yes [provider]  blood glucose meter kit and supplies Dispense based on patient and insurance preference. Use up to four times daily as directed. (FOR ICD-10 E10.9, E11.9). 10/03/18  Yes Aline August, MD  ferrous sulfate 325 (65 FE) MG tablet Take 1 tablet (325 mg total) by mouth daily with breakfast. 03/15/22  Yes Charlynne Cousins, MD  folic acid (FOLVITE) 1 MG tablet Take 1 mg by mouth every morning. 08/19/17  Yes [provider]  furosemide (LASIX) 40 MG tablet Take 40 mg by mouth daily.   Yes [provider]  levalbuterol (XOPENEX HFA) 45 MCG/ACT inhaler Inhale 1-2 puffs into the lungs every 6 (six) hours as needed for wheezing. 04/19/22  Yes Parrett, Tammy S, NP  losartan (COZAAR) 25 MG tablet TAKE 1 TABLET BY MOUTH EVERY DAY 10/29/19  Yes Josue Hector, MD  metFORMIN (GLUCOPHAGE) 500 MG tablet Take 2 tablets  (1,000 mg total) by mouth 2 (two) times daily. 10/03/18  Yes Aline August, MD  metoprolol tartrate (LOPRESSOR) 25 MG tablet Take 1 tablet (25 mg total) by mouth 2 (two) times daily. 04/09/22  Yes Josue Hector, MD  NOVOLOG MIX 70/30 FLEXPEN (70-30) 100 UNIT/ML FlexPen Inject 0.4 mLs (40 Units total) into the skin 2 (two) times daily with a meal. Patient taking differently: Inject 40-80 Units into the skin See admin instructions. Inject 40-80 units subcutaneously once or twice daily with meals - dose is based on CBG and carbs 10/03/18  Yes Alekh, Kshitiz, MD  potassium chloride (KLOR-CON M) 10 MEQ tablet Take 1 tablet (10 mEq total) by mouth 2 (two) times daily. 04/09/22  Yes Swinyer, Lanice Schwab, NP  predniSONE (DELTASONE) 10 MG tablet Takes 6 tablets for 1 days, then 5 tablets for 1 days, then 4 tablets for 1 days, then 3 tablets for 1 days, then 2 tabs for 1 days, then 1 tab for 1 days, and then stop. Patient taking differently: Take 10 mg by mouth daily. 03/15/22  Yes Charlynne Cousins, MD  spironolactone (ALDACTONE)  25 MG tablet Take 0.5 tablets (12.5 mg total) by mouth daily. 04/21/22  Yes Swinyer, Lanice Schwab, NP    Family History Family History  Problem Relation Age of Onset   Hypertension Mother    Diabetes Mother    Throat cancer Mother    Hypertension Father    Diabetes Sister    Heart Problems Sister    Kidney failure Sister    Hypertension Brother    Diabetes Brother    Diabetes Paternal Grandmother    Autoimmune disease Daughter    Healthy Son     Social History Social History   Tobacco Use   Smoking status: Former    Packs/day: 1.00    Years: 36.00    Total pack years: 36.00    Types: Cigarettes    Start date: 29    Quit date: 06/08/2014    Years since quitting: 7.9    Passive exposure: Never   Smokeless tobacco: Never  Vaping Use   Vaping Use: Former  Substance Use Topics   Alcohol use: No    Alcohol/week: 0.0 standard drinks of alcohol   Drug use: No      Allergies   Ace inhibitors and Lisinopril   Review of Systems Review of Systems  Constitutional: Negative.   Respiratory: Negative.    Cardiovascular: Negative.   Musculoskeletal:  Positive for arthralgias. Negative for back pain, gait problem, joint swelling, myalgias, neck pain and neck stiffness.  Skin: Negative.      Physical Exam Triage Vital Signs ED Triage Vitals  Enc Vitals Group     BP 05/31/22 1558 115/72     Pulse Rate 05/31/22 1558 81     Resp 05/31/22 1558 18     Temp 05/31/22 1558 99.2 F (37.3 C)     Temp Source 05/31/22 1558 Oral     SpO2 05/31/22 1558 95 %     Weight --      Height --      Head Circumference --      Peak Flow --      Pain Score 05/31/22 1600 9     Pain Loc --      Pain Edu? --      Excl. in [place]? --    No data found.  Updated Vital Signs BP 115/72 (BP Location: Right Arm)   Pulse 81   Temp 99.2 F (37.3 C) (Oral)   Resp 18   LMP 10/28/2013   SpO2 95%   Visual Acuity Right Eye Distance:   Left Eye Distance:   Bilateral Distance:    Right Eye Near:   Left Eye Near:    Bilateral Near:     Physical Exam Constitutional:      Appearance: Normal appearance.  Eyes:     Extraocular Movements: Extraocular movements intact.  Pulmonary:     Effort: Pulmonary effort is normal.  Musculoskeletal:     Comments: discomfort over the bilateral shoulders, bilateral knees, bilateral elbows, bilateral hips, bilateral wrist  Skin:    General: Skin is warm and dry.  Neurological:     Mental Status: She is alert and oriented to person, place, and time. Mental status is at baseline.  Psychiatric:        Mood and Affect: Mood normal.        Behavior: Behavior normal.      UC Treatments / Results  Labs (all labs ordered are listed, but only abnormal results are displayed) Labs Reviewed - No data  to display  EKG   Radiology No results found.  Procedures Procedures (including critical care time)  Medications Ordered in  UC Medications - No data to display  Initial Impression / Assessment and Plan / UC Course  I have reviewed the triage vital signs and the nursing notes.  Pertinent labs & imaging results that were available during my care of the patient were reviewed by me and considered in my medical decision making (see chart for details).  Rheumatoid arthritis flare  Prednisone taper prescribed, declined injection, recommended continue supportive care for additional management of symptoms, advised follow-up with PCP for further evaluation and medication management   Final Clinical Impressions(s) / UC Diagnoses   Final diagnoses:  None   Discharge Instructions   None    ED Prescriptions   None    PDMP not reviewed this encounter.   Hans Eden, [place] 05/31/22 [phone]

## 2022-05-31 NOTE — Discharge Instructions (Addendum)
Starting tomorrow take prednisone every morning with food as directed  You may place ice or heat over your affected joints in 10 to 15-minute intervals  You may continue to massage and stretch as tolerated  You may follow-up with your primary doctor for further management of your medications  You may attempt to purchase a soft back brace for support

## 2022-06-02 ENCOUNTER — Emergency Department (HOSPITAL_COMMUNITY)
Admission: EM | Admit: 2022-06-02 | Discharge: 2022-06-02 | Disposition: A | Discharge status: Home / Self Care | Payer: Medicaid Other | Attending: Emergency Medicine | Admitting: Emergency Medicine

## 2022-06-02 ENCOUNTER — Other Ambulatory Visit: Payer: Self-pay

## 2022-06-02 ENCOUNTER — Encounter (HOSPITAL_COMMUNITY): Payer: Self-pay | Admitting: Emergency Medicine

## 2022-06-02 ENCOUNTER — Emergency Department (HOSPITAL_COMMUNITY)
Admission: EM | Admit: 2022-06-02 | Discharge: 2022-06-02 | Discharge status: Against Medical Advice | Payer: Medicaid Other

## 2022-06-02 DIAGNOSIS — R739 Hyperglycemia, unspecified: Secondary | ICD-10-CM

## 2022-06-02 DIAGNOSIS — Z794 Long term (current) use of insulin: Secondary | ICD-10-CM | POA: Diagnosis not present

## 2022-06-02 DIAGNOSIS — Z7984 Long term (current) use of oral hypoglycemic drugs: Secondary | ICD-10-CM | POA: Diagnosis not present

## 2022-06-02 DIAGNOSIS — E1165 Type 2 diabetes mellitus with hyperglycemia: Secondary | ICD-10-CM | POA: Diagnosis not present

## 2022-06-02 DIAGNOSIS — Z7901 Long term (current) use of anticoagulants: Secondary | ICD-10-CM | POA: Diagnosis not present

## 2022-06-02 LAB — CBG MONITORING, ED
Glucose-Capillary: 127 mg/dL — ABNORMAL HIGH (ref 70–99)
Glucose-Capillary: 131 mg/dL — ABNORMAL HIGH (ref 70–99)

## 2022-06-02 NOTE — Discharge Instructions (Signed)
Please schedule an appointment with PCP to evaluate your meter.  Your sugar is normal today.

## 2022-06-02 NOTE — ED Provider Notes (Signed)
[facility]-EMERGENCY DEPT Provider Note   CSN: [id number] Arrival date & time: 06/02/22  2244     History PMH: DM Chief Complaint  Patient presents with   Hyperglycemia    Kathleen Kane is a 61 y.o. female.  Presents with concern for hyperglycemia as she was taking her blood sugar today and there was no reading.  She kept having her daughter do her own blood sugar as well and she was getting normal readings.  She is asymptomatic, so she was very confused that she does not feel hyperglycemic.  She came here to be evaluated.    Hyperglycemia      Home Medications Prior to Admission medications   Medication Sig Start Date End Date Taking? Authorizing Provider  Accu-Chek FastClix Lancets MISC Apply topically. 08/04/20   [provider]  ACCU-CHEK GUIDE test strip  08/14/20   [provider]  apixaban (ELIQUIS) 5 MG TABS tablet TAKE 1 TABLET BY MOUTH TWICE A DAY 01/11/22   Wendall Stade, MD  atorvastatin (LIPITOR) 20 MG tablet TAKE 1 TABLET (20 MG TOTAL) BY MOUTH DAILY. PLEASE SCHEDULE YEARLY APPOINTMENT FOR FUTURE REFILLS. 1ST ATTEMPT. THANK YOU 01/29/22   Wendall Stade, MD  B-D ULTRAFINE III SHORT PEN 31G X 8 MM MISC Inject into the skin 2 (two) times daily. 06/30/20   [provider]  blood glucose meter kit and supplies Dispense based on patient and insurance preference. Use up to four times daily as directed. (FOR ICD-10 E10.9, E11.9). 10/03/18   Glade Lloyd, MD  ferrous sulfate 325 (65 FE) MG tablet Take 1 tablet (325 mg total) by mouth daily with breakfast. 03/15/22   Marinda Elk, MD  folic acid (FOLVITE) 1 MG tablet Take 1 mg by mouth every morning. 08/19/17   [provider]  furosemide (LASIX) 40 MG tablet Take 40 mg by mouth daily.    [provider]  levalbuterol Pauline Aus HFA) 45 MCG/ACT inhaler Inhale 1-2 puffs into the lungs every 6 (six) hours as needed for wheezing. 04/19/22   Parrett, Virgel Bouquet,  NP  losartan (COZAAR) 25 MG tablet TAKE 1 TABLET BY MOUTH EVERY DAY 10/29/19   Wendall Stade, MD  metFORMIN (GLUCOPHAGE) 500 MG tablet Take 2 tablets (1,000 mg total) by mouth 2 (two) times daily. 10/03/18   Glade Lloyd, MD  metoprolol tartrate (LOPRESSOR) 25 MG tablet Take 1 tablet (25 mg total) by mouth 2 (two) times daily. 04/09/22   Wendall Stade, MD  NOVOLOG MIX 70/30 FLEXPEN (70-30) 100 UNIT/ML FlexPen Inject 0.4 mLs (40 Units total) into the skin 2 (two) times daily with a meal. Patient taking differently: Inject 40-80 Units into the skin See admin instructions. Inject 40-80 units subcutaneously once or twice daily with meals - dose is based on CBG and carbs 10/03/18   Glade Lloyd, MD  potassium chloride (KLOR-CON M) 10 MEQ tablet Take 1 tablet (10 mEq total) by mouth 2 (two) times daily. 04/09/22   Swinyer, Zachary George, NP  predniSONE (STERAPRED UNI-PAK 21 TAB) 10 MG (21) TBPK tablet Take by mouth daily. Take 6 tabs by mouth daily  for 2 days, then 5 tabs for 2 days, then 4 tabs for 2 days, then 3 tabs for 2 days, 2 tabs for 2 days, then 1 tab by mouth daily for 2 days 05/31/22   Valinda Hoar, NP  spironolactone (ALDACTONE) 25 MG tablet Take 0.5 tablets (12.5 mg total) by mouth daily. 04/21/22  Swinyer, Lanice Schwab, NP      Allergies    Ace inhibitors and Lisinopril    Review of Systems   Review of Systems  All other systems reviewed and are negative.   Physical Exam Updated Vital Signs BP 135/69 (BP Location: Left Arm)   Pulse 85   Temp 98.4 F (36.9 C) (Oral)   Resp 18   LMP 10/28/2013   SpO2 100%  Physical Exam Vitals and nursing note reviewed.  Constitutional:      General: She is not in acute distress.    Appearance: Normal appearance. She is well-developed. She is not ill-appearing, toxic-appearing or diaphoretic.  HENT:     Head: Normocephalic and atraumatic.     Nose: No nasal deformity.     Mouth/Throat:     Lips: Pink. No lesions.  Eyes:     General:  Gaze aligned appropriately. No scleral icterus.       Right eye: No discharge.        Left eye: No discharge.     Conjunctiva/sclera: Conjunctivae normal.     Right eye: Right conjunctiva is not injected. No exudate or hemorrhage.    Left eye: Left conjunctiva is not injected. No exudate or hemorrhage. Pulmonary:     Effort: Pulmonary effort is normal. No respiratory distress.  Skin:    General: Skin is warm and dry.  Neurological:     Mental Status: She is alert and oriented to person, place, and time.  Psychiatric:        Mood and Affect: Mood normal.        Speech: Speech normal.        Behavior: Behavior normal. Behavior is cooperative.     ED Results / Procedures / Treatments   Labs (all labs ordered are listed, but only abnormal results are displayed) Labs Reviewed  CBG MONITORING, ED - Abnormal; Notable for the following components:      Result Value   Glucose-Capillary 127 (*)    All other components within normal limits  CBG MONITORING, ED - Abnormal; Notable for the following components:   Glucose-Capillary 131 (*)    All other components within normal limits    EKG None  Radiology No results found.  Procedures Procedures   Medications Ordered in ED Medications - No data to display  ED Course/ Medical Decision Making/ A&P                           Medical Decision Making  Patient presents with concern for hyperglycemia and malfunction of her glucometer.  She has no symptoms and is hemodynamically stable.  We rechecked her sugar 2 times and got 127 and 131 respectively.  I think there may be a equipment nonfiction that is causing her sugars not to be reading at her home machine.  Since she has absolutely no symptoms, I do not think we should do any further work-up at this time.  I recommend follow-up with her PCP to discuss glucometer options.   Final Clinical Impression(s) / ED Diagnoses Final diagnoses:  Hyperglycemia    Rx / DC Orders ED Discharge  Orders     None         Adolphus Birchwood, PA-C 06/02/22 2308    Deno Etienne, DO 06/02/22 2308

## 2022-06-02 NOTE — ED Triage Notes (Signed)
Patient reports trying to get a CBG on her meter and it not reading. It read another person's, so she concluded her CBG was too high for it to read. When her CBG was taken here, we got a reading of 127.

## 2022-06-02 NOTE — ED Notes (Signed)
Patient states the wait is too long and they are going to [place] long

## 2022-06-08 ENCOUNTER — Other Ambulatory Visit: Payer: Self-pay | Admitting: Cardiovascular Disease

## 2022-06-08 DIAGNOSIS — I4811 Longstanding persistent atrial fibrillation: Secondary | ICD-10-CM

## 2022-06-08 NOTE — Telephone Encounter (Signed)
Eliquis '5mg'$  refill request received. Patient is 61 years old, weight-102.2kg, Crea-0.95 on 05/24/2022, Diagnosis-Afib, and last seen by Christen Bame on 05/24/2022. Dose is appropriate based on dosing criteria. Will send in refill to requested pharmacy.

## 2022-06-21 ENCOUNTER — Ambulatory Visit (INDEPENDENT_AMBULATORY_CARE_PROVIDER_SITE_OTHER): Payer: Medicaid Other | Admitting: Orthopedic Surgery

## 2022-06-21 ENCOUNTER — Encounter: Payer: Self-pay | Admitting: Orthopedic Surgery

## 2022-06-21 DIAGNOSIS — M170 Bilateral primary osteoarthritis of knee: Secondary | ICD-10-CM | POA: Diagnosis not present

## 2022-06-21 NOTE — Progress Notes (Signed)
Office Visit Note   Patient: Kathleen Kane           Date of Birth: 17-Apr-1961           MRN: [id number] Visit Date: 06/21/2022 Requested by: [name], MD [address] [place],  [place] [address] PCP: [name], MD  Subjective: Chief Complaint  Patient presents with   Right Knee - Pain   Left Knee - Pain    HPI: Kathleen Kane is a 61 y.o. female who presents to the office complaining of bilateral knee pain, right greater than left.  Patient has history of rheumatoid arthritis for which she takes prednisone 10 mg daily from her PCP.  Has previously seen Dr Estanislado Pandy.  Patient states that pain wakes her up every night.  Keeps her from walking more than 10-15 steps at a time.  She has had previous cortisone injections and states that these did not really provide much lasting relief.  Has not been seen since summer 2022.  Does have significant medical history including atrial fibrillation on Eliquis, pulmonary fibrosis, insulin-dependent diabetes with last A1c 7.6.Marland Kitchen                ROS: All systems reviewed are negative as they relate to the chief complaint within the history of present illness.  Patient denies fevers or chills.  Assessment & Plan: Visit Diagnoses:  1. Bilateral primary osteoarthritis of knee     Plan: Patient is a 61 year old female who returns for reevaluation of bilateral knee pain.  She has severe primarily medial compartment arthritis of both knees with the right knee bothering her more than the left.  Discussed the options available to patient including cortisone injection, gel injection, physical therapy, knee replacement surgery.  Discussed the risks and benefits of knee replacement surgery including but not limited to the risk of nerve/vessel damage, knee stiffness, knee instability, need for revision surgery, prosthetic joint infection, medical complications such as DVT/PE/MI/stroke/death/amputation.  After lengthy discussion of the risks, patient  would like to hold off on knee replacement but would like to try gel injections.  Unfortunately due to her being on Medicaid, she cannot have gel injections aside from pain out-of-pocket for try this.  She will reach out when she would like to proceed with this.  Follow-up as needed.  Follow-Up Instructions: No follow-ups on file.   Orders:  No orders of the defined types were placed in this encounter.  No orders of the defined types were placed in this encounter.     Procedures: No procedures performed   Clinical Data: No additional findings.  Objective: Vital Signs: LMP 10/28/2013   Physical Exam:  Constitutional: Patient appears well-developed HEENT:  Head: Normocephalic Eyes:EOM are normal Neck: Normal range of motion Cardiovascular: Normal rate Pulmonary/chest: Effort normal Neurologic: Patient is alert Skin: Skin is warm Psychiatric: Patient has normal mood and affect  Ortho Exam: Ortho exam demonstrates right knee with 2+ DP pulse of the right lower extremity.  Able to perform straight leg raise without extensor lag.  Severe tenderness over the medial joint line with moderate tenderness over the lateral joint line.  No pain with hip range of motion.  Very slightly correctable varus deformity.  No calf tenderness.  Negative Homans' sign.  Stable to varus and valgus stress.  Specialty Comments:  No specialty comments available.  Imaging: No results found.   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   Dysphagia 01/26/2022   Personal history of colonic polyps 01/26/2022   Aortic atherosclerosis ([place]) 05/13/2021   Acute otalgia, right 11/26/2020   Arthralgia of right  temporomandibular joint 11/26/2020   Bilateral impacted cerumen 11/26/2020   HCAP (healthcare-associated pneumonia) 10/02/2018   OSA (obstructive sleep apnea) 02/10/2017   Carpal tunnel syndrome, right 12/15/2016   Extensor tenosynovitis of wrist, right 12/15/2016   Bilateral primary osteoarthritis of knee 10/08/2015   Rheumatoid arthritis, 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[place]) 10/08/2015   Acute on chronic diastolic CHF (congestive heart failure) ([place]) 08/08/2015   Morbid obesity ([place]) 05/06/2015   Essential hypertension 04/10/2015   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   Primary osteoarthritis involving multiple joints 04/09/2015   Elevated uric acid in blood 01/23/2014   High risk medication use 01/23/2014   Numbness and tingling in left hand 10/01/2013   Tobacco abuse 10/01/2013   Elevated CA-125 02/06/2013   Pelvic mass in female 01/29/2013   Diabetes mellitus ([place]) 08/10/2012   Atrial fibrillation (H[facility])    Chronic diastolic CHF (congestive heart failure) ([place])    Past Medical History:  Diagnosis Date   Acute on chronic diastolic CHF (congestive heart failure) ([place]) 08/08/2015   Anxiety    Atrial fibrillation, persistent ([place])    Carpal tunnel syndrome, right 12/15/2016   CHF (congestive heart failure) ([place]) 2013   No echo found from that time, most likely diastolic   CHF exacerbation ([place]) 10/01/2018   Depression    Diabetes mellitus ([place]) 08/10/2012   Diabetes mellitus without complication ([place])    Hypertension    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 AHI-6.8/hr, during REM-27.3/hr. RDI-25.0/hr, during REM-40.9/hr. avg o2 sat during REM and NREM 95%   OSA (obstructive sleep apnea) 02/10/2017   Pelvic mass in female 01/29/2013   With ascites    Pneumonia 10/02/2018   Rheumatoid  arthritis ([place])    Rheumatoid arthritis, 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[place]) 10/08/2015    Family History  Problem Relation Age of Onset   Hypertension Mother    Diabetes Mother    Throat cancer Mother    Hypertension Father    Diabetes Sister    Heart Problems Sister    Kidney failure Sister    Hypertension Brother    Diabetes Brother    Diabetes Paternal Grandmother    Autoimmune disease Daughter    Healthy Son     Past Surgical History:  Procedure Laterality Date   CARDIOVASCULAR STRESS TEST  02/16/2013   no significant EKG changes with Lexiscan, normal LV function and normal wall function   CESAREAN SECTION     x2   DOPPLER ECHOCARDIOGRAPHY  01/24/2008   EF 55-60%, no diagnostic evidence of LV wall motion abnormalities, LV wall thickness was mild-moderately increased.   HAND SURGERY Right 2018   LAPAROTOMY Right 02/27/2013   Procedure: EXPLORATORY LAPAROTOMY, right salpingo-oopherectomy;  Surgeon: [name], MD;  Location: [facility];  Service: Gynecology;  Laterality: Right;   left breast cyst     SALPINGOOPHORECTOMY Right 02/27/2013   Procedure: SALPINGO OOPHORECTOMY;  Surgeon: [name], MD;  Location: [facility];  Service:  Gynecology;  Laterality: Right;   TUBAL LIGATION Bilateral 1989   Social History   Occupational History   Not on file  Tobacco Use   Smoking status: Former    Packs/day: 1.00    Years: 36.00    Total pack years: 36.00    Types: Cigarettes    Start date: 78    Quit date: 06/08/2014    Years since quitting: 8.0    Passive exposure: Never   Smokeless tobacco: Never  Vaping Use   Vaping Use: Former  Substance and Sexual Activity   Alcohol use: No    Alcohol/week: 0.0 standard drinks of alcohol   Drug use: No   Sexual activity: Yes    Partners: Male

## 2022-06-28 ENCOUNTER — Telehealth: Payer: Self-pay | Admitting: Internal Medicine

## 2022-06-28 NOTE — Telephone Encounter (Signed)
Attempted to call pt but unable to reach. Unable to leave VM as mailbox is full. Will try to call back later. 

## 2022-06-28 NOTE — Telephone Encounter (Signed)
Quantiferon gold now reading as indeterminate  Plan  - repat Quant gold - can do it now some time or when she sees me 07/27/22  Thanks    SIGNATURE    Dr. Brand Males, M.D., F.C.C.P,  Pulmonary and Critical Care Medicine Staff Physician, [place] Director - Interstitial Lung Disease  Program  Medical Director - [facility] [place] [facility] [place], [place], [address]  NPI Number:  NPI #[id number] [facility] Number: [id number]  Pager: [phone], If no answer  -[place] or [place] Telephone (clinical office): [phone] Telephone (research): [phone]  11:39 AM 06/28/2022

## 2022-07-01 NOTE — Telephone Encounter (Signed)
Attempted to call pt but unable to reach and unable to leave VM as mailbox is full. Due to multiple attempts trying to reach pt and unable to do so, per protocol encounter will be closed.

## 2022-07-05 ENCOUNTER — Other Ambulatory Visit: Payer: Self-pay | Admitting: Nurse Practitioner

## 2022-07-06 ENCOUNTER — Ambulatory Visit (HOSPITAL_COMMUNITY)
Admission: RE | Admit: 2022-07-06 | Discharge: 2022-07-06 | Disposition: A | Discharge status: Home / Self Care | Payer: Medicaid Other | Source: Ambulatory Visit | Attending: Internal Medicine | Admitting: Internal Medicine

## 2022-07-06 ENCOUNTER — Encounter (HOSPITAL_COMMUNITY): Payer: Self-pay

## 2022-07-06 ENCOUNTER — Ambulatory Visit (INDEPENDENT_AMBULATORY_CARE_PROVIDER_SITE_OTHER): Payer: Medicaid Other

## 2022-07-06 VITALS — BP 151/76 | HR 78 | Temp 98.5°F | Resp 16 | Ht 62.0 in | Wt 220.0 lb

## 2022-07-06 DIAGNOSIS — M7989 Other specified soft tissue disorders: Secondary | ICD-10-CM

## 2022-07-06 DIAGNOSIS — R0602 Shortness of breath: Secondary | ICD-10-CM | POA: Diagnosis not present

## 2022-07-06 DIAGNOSIS — M069 Rheumatoid arthritis, unspecified: Secondary | ICD-10-CM | POA: Diagnosis not present

## 2022-07-06 DIAGNOSIS — M2550 Pain in unspecified joint: Secondary | ICD-10-CM | POA: Diagnosis not present

## 2022-07-06 DIAGNOSIS — I509 Heart failure, unspecified: Secondary | ICD-10-CM

## 2022-07-06 MED ORDER — PREDNISONE 10 MG PO TABS: ORAL_TABLET | ORAL | 0 refills | Status: AC

## 2022-07-06 MED ORDER — FUROSEMIDE 40 MG PO TABS: 40.0000 mg | ORAL_TABLET | Freq: Every day | ORAL | 0 refills | Status: AC

## 2022-07-06 NOTE — ED Provider Notes (Signed)
[facility]    CSN: [id number] Arrival date & time: 07/06/22  0809      History   Chief Complaint Chief Complaint  Patient presents with   Generalized Body Aches    HPI Kathleen Kane is a 61 y.o. female.   Patient presents urgent care for evaluation of generalized body pain related to rheumatoid arthritis as well as worsening shortness of breath over the last 2 to 3 weeks while she has been out of her Lasix prescription.  She states that she had an appointment scheduled with her primary care provider for August but she was unable to make it and the appointment has been moved to July 29, 2022.  She has been without her 10 mg of prednisone daily and her 40 mg of Lasix for the last 2 to 3 weeks and this is caused her to have worsening symptoms related to her rheumatoid arthritis as well as her CHF.  Patient takes Eliquis daily due to atrial fibrillation.  States that she is currently short of breath upon walking short distances but not when sitting and resting.  Leg swelling has worsened over the last 2 to 3 weeks as well.  When she takes Lasix, she denies orthopnea, but states that she has been having to sleep in her recliner due to shortness of breath when laying flat at nighttime.  She has had a worsening cough with yellow/clear phlegm over the last 2 or 3 weeks as well.  She is requesting refills of her medications to get her through until her primary care appointment on July 29, 2022.  Denies chest pain, weakness, headache, nasal congestion, sick contacts, recent falls, and fever/chills.  She is ambulatory with an unsteady gait at baseline and uses a wheelchair for assistance.  She is a former smoker and quit in August 2015.     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 AHI-6.8/hr, during REM-27.3/hr. RDI-25.0/hr, during REM-40.9/hr. 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   Dysphagia 01/26/2022   Personal history of colonic polyps 01/26/2022   Aortic atherosclerosis ([place]) 05/13/2021   Acute otalgia, right 11/26/2020   Arthralgia of right temporomandibular joint 11/26/2020   Bilateral impacted cerumen 11/26/2020   HCAP (healthcare-associated pneumonia) 10/02/2018   Chronic congestive heart failure ([place]) 10/01/2018  OSA (obstructive sleep apnea) 02/10/2017   Carpal tunnel syndrome, right 12/15/2016   Extensor tenosynovitis of wrist, right 12/15/2016   Bilateral primary osteoarthritis of knee 10/08/2015   Rheumatoid arthritis, 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   Acute on chronic diastolic CHF (congestive heart failure) ([place]) 08/08/2015   Morbid obesity ([place]) 05/06/2015   Essential hypertension 04/10/2015   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   Primary osteoarthritis involving  multiple joints 04/09/2015   Elevated uric acid in blood 01/23/2014   High risk medication use 01/23/2014   Numbness and tingling in left hand 10/01/2013   Tobacco abuse 10/01/2013   Elevated CA-125 02/06/2013   Pelvic mass in female 01/29/2013   Diabetes mellitus ([place]) 08/10/2012   Atrial fibrillation (HCC)    Chronic diastolic CHF (congestive heart failure) ([place])     Past Surgical History:  Procedure Laterality Date   CARDIOVASCULAR STRESS TEST  02/16/2013   no significant EKG changes with Lexiscan, normal LV function and normal wall function   CESAREAN SECTION     x2   DOPPLER ECHOCARDIOGRAPHY  01/24/2008   EF 55-60%, no diagnostic evidence of LV wall motion abnormalities, LV wall thickness was mild-moderately increased.   HAND SURGERY Right 2018   LAPAROTOMY Right 02/27/2013   Procedure: EXPLORATORY LAPAROTOMY, right salpingo-oopherectomy;  Surgeon: [name], MD;  Location: [facility];  Service: Gynecology;  Laterality: Right;   left breast cyst     SALPINGOOPHORECTOMY Right 02/27/2013   Procedure: SALPINGO OOPHORECTOMY;  Surgeon: [name], MD;  Location: [facility];  Service: Gynecology;  Laterality: Right;   TUBAL LIGATION Bilateral 1989    OB History   No obstetric history on file.      Home Medications    Prior to Admission medications   Medication Sig Start Date End Date Taking? Authorizing Provider  apixaban (ELIQUIS) 5 MG TABS tablet TAKE 1 TABLET BY MOUTH TWICE A DAY 06/08/22  Yes Josue Hector, MD  atorvastatin (LIPITOR) 20 MG tablet TAKE 1 TABLET (20 MG TOTAL) BY MOUTH DAILY. PLEASE SCHEDULE YEARLY APPOINTMENT FOR FUTURE REFILLS. 1ST ATTEMPT. [place] YOU 01/29/22  Yes Josue Hector, MD  ferrous sulfate 325 (65 FE) MG tablet Take 1 tablet (325 mg total) by mouth daily with breakfast. 03/15/22  Yes Charlynne Cousins, MD  folic acid (FOLVITE) 1 MG tablet Take 1 mg by mouth every morning. 08/19/17  Yes [provider]  levalbuterol (XOPENEX HFA) 45  MCG/ACT inhaler Inhale 1-2 puffs into the lungs every 6 (six) hours as needed for wheezing. 04/19/22  Yes Parrett, Tammy S, NP  metFORMIN (GLUCOPHAGE) 500 MG tablet Take 2 tablets (1,000 mg total) by mouth 2 (two) times daily. 10/03/18  Yes Aline August, MD  metoprolol tartrate (LOPRESSOR) 25 MG tablet Take 1 tablet (25 mg total) by mouth 2 (two) times daily. 04/09/22  Yes Josue Hector, MD  NOVOLOG MIX 70/30 FLEXPEN (70-30) 100 UNIT/ML FlexPen Inject 0.4 mLs (40 Units total) into the skin 2 (two) times daily with a meal. Patient taking differently: Inject 40-80 Units into the skin See admin instructions. Inject 40-80 units subcutaneously once or twice daily with meals - dose is based on CBG and carbs 10/03/18  Yes Alekh, Kshitiz, MD  potassium chloride (KLOR-CON M) 10 MEQ tablet Take 1 tablet (10 mEq total) by mouth 2 (two) times daily. 04/09/22  Yes Swinyer, Lanice Schwab, NP  predniSONE (DELTASONE) 10 MG tablet Take 2 tablets (20 mg total) by mouth daily with breakfast for  3 days, THEN 1 tablet (10 mg total) daily with breakfast for 23 days. 07/06/22 08/01/22 Yes Talbot Grumbling, FNP  spironolactone (ALDACTONE) 25 MG tablet Take 0.5 tablets (12.5 mg total) by mouth daily. 04/21/22  Yes Swinyer, Lanice Schwab, NP  Accu-Chek FastClix Lancets MISC Apply topically. 08/04/20   [provider]  ACCU-CHEK GUIDE test strip  08/14/20   [provider]  B-D ULTRAFINE III SHORT PEN 31G X 8 MM MISC Inject into the skin 2 (two) times daily. 06/30/20   [provider]  blood glucose meter kit and supplies Dispense based on patient and insurance preference. Use up to four times daily as directed. (FOR ICD-10 E10.9, E11.9). 10/03/18   Aline August, MD  furosemide (LASIX) 40 MG tablet Take 1 tablet (40 mg total) by mouth daily. 07/06/22   Talbot Grumbling, FNP  losartan (COZAAR) 25 MG tablet TAKE 1 TABLET BY MOUTH EVERY DAY 10/29/19   Josue Hector, MD    Family History Family History   Problem Relation Age of Onset   Hypertension Mother    Diabetes Mother    Throat cancer Mother    Hypertension Father    Diabetes Sister    Heart Problems Sister    Kidney failure Sister    Hypertension Brother    Diabetes Brother    Diabetes Paternal Grandmother    Autoimmune disease Daughter    Healthy Son     Social History Social History   Tobacco Use   Smoking status: Former    Packs/day: 1.00    Years: 36.00    Total pack years: 36.00    Types: Cigarettes    Start date: 1978    Quit date: 06/08/2014    Years since quitting: 8.0    Passive exposure: Never   Smokeless tobacco: Never  Vaping Use   Vaping Use: Former  Substance Use Topics   Alcohol use: No    Alcohol/week: 0.0 standard drinks of alcohol   Drug use: No     Allergies   Ace inhibitors and Lisinopril   Review of Systems Review of Systems  Constitutional:  Negative for chills, fever and unexpected weight change.  HENT:  Negative for postnasal drip, rhinorrhea, sneezing and sore throat.   Eyes:  Negative for discharge and itching.  Gastrointestinal:  Negative for blood in stool, constipation, nausea and vomiting.  Musculoskeletal:  Positive for arthralgias.  Skin:  Negative for color change and rash.  Neurological:  Negative for dizziness, syncope, light-headedness and headaches.    Physical Exam Triage Vital Signs ED Triage Vitals  Enc Vitals Group     BP 07/06/22 0832 (!) 151/76     Pulse Rate 07/06/22 0832 78     Resp 07/06/22 0832 16     Temp 07/06/22 0832 98.5 F (36.9 C)     Temp Source 07/06/22 0832 Oral     SpO2 07/06/22 0832 100 %     Weight 07/06/22 0834 220 lb (99.8 kg)     Height 07/06/22 0834 '5\' 2"'  (1.575 m)     Head Circumference --      Peak Flow --      Pain Score 07/06/22 0834 10     Pain Loc --      Pain Edu? --      Excl. in [place]? --    No data found.  Updated Vital Signs BP (!) 151/76 (BP Location: Left Arm)   Pulse 78   Temp 98.5 F (  36.9 C) (Oral)   Resp  16   Ht '5\' 2"'  (1.575 m)   Wt 220 lb (99.8 kg)   LMP 10/28/2013   SpO2 100%   BMI 40.24 kg/m   Visual Acuity Right Eye Distance:   Left Eye Distance:   Bilateral Distance:    Right Eye Near:   Left Eye Near:    Bilateral Near:     Physical Exam Vitals and nursing note reviewed.  Constitutional:      General: She is not in acute distress.    Appearance: Normal appearance. She is obese. She is not ill-appearing or toxic-appearing.     Comments: Very pleasant patient sitting on exam in position of comfort table in no acute distress.   HENT:     Head: Normocephalic and atraumatic.     Right Ear: Hearing, tympanic membrane, ear canal and external ear normal.     Left Ear: Hearing, tympanic membrane, ear canal and external ear normal.     Nose: Nose normal.     Mouth/Throat:     Lips: Pink.     Mouth: Mucous membranes are moist.     Pharynx: No posterior oropharyngeal erythema.  Eyes:     General: Lids are normal. Vision grossly intact. Gaze aligned appropriately.     Extraocular Movements: Extraocular movements intact.     Conjunctiva/sclera: Conjunctivae normal.  Cardiovascular:     Rate and Rhythm: Normal rate and regular rhythm.     Heart sounds: Normal heart sounds, S1 normal and S2 normal.     Comments: Sensation intact bilateral lower extremities.  +2 anterior tibialis pulses bilaterally.  Capillary refill is less than 3. Pulmonary:     Effort: Pulmonary effort is normal. No accessory muscle usage, prolonged expiration or respiratory distress.     Breath sounds: Normal air entry. No decreased air movement. Examination of the right-lower field reveals rales. Rales present.     Comments: No acute respiratory distress.  Abdominal:     Palpations: Abdomen is soft.  Musculoskeletal:     Cervical back: Neck supple.     Right lower leg: 2+ Edema present.     Left lower leg: 2+ Edema present.  Skin:    General: Skin is warm and dry.     Capillary Refill: Capillary refill  takes less than 2 seconds.     Findings: No rash.  Neurological:     General: No focal deficit present.     Mental Status: She is alert and oriented to person, place, and time. Mental status is at baseline.     Cranial Nerves: No dysarthria or facial asymmetry.     Gait: Gait is intact.  Psychiatric:        Mood and Affect: Mood normal.        Speech: Speech normal.        Behavior: Behavior normal.        Thought Content: Thought content normal.        Judgment: Judgment normal.      UC Treatments / Results  Labs (all labs ordered are listed, but only abnormal results are displayed) Labs Reviewed - No data to display  EKG   Radiology DG Chest 2 View  Result Date: 07/06/2022 CLINICAL DATA:  Shortness of breath. EXAM: CHEST - 2 VIEW COMPARISON:  Mar 11, 2022. FINDINGS: Stable cardiomegaly. Mild interstitial densities are noted throughout both lungs which may represent chronic interstitial lung disease, but acute superimposed edema or inflammation cannot be  excluded. Bony thorax is unremarkable. IMPRESSION: Mild diffuse interstitial densities are noted bilaterally which may represent chronic interstitial lung disease, but acute superimposed edema or inflammation cannot be excluded. Electronically Signed   By: Marijo Conception M.D.   On: 07/06/2022 09:42    Procedures Procedures (including critical care time)  Medications Ordered in UC Medications - No data to display  Initial Impression / Assessment and Plan / UC Course  I have reviewed the triage vital signs and the nursing notes.  Pertinent labs & imaging results that were available during my care of the patient were reviewed by me and considered in my medical decision making (see chart for details).   1.  Leg swelling and congestive heart failure Chest x-ray shows stable cardiopulmonary findings although cannot rule out acute superimposed edema or inflammation per radiologist read.  Based on patient's clinical presentation, I  feel comfortable letting her go home with refill of Lasix medication.  She is not currently in any acute distress and is clinically well-appearing.  She is to take 80 mg of Lasix today when she picks up her prescription, then take 40 mg daily ongoing.  She may take 2 Lasix tablets as directed by cardiologist if she experiences worsening shortness of breath and orthopnea, then go back to taking 1 tablet/day.  Advised patient to pick up refill of potassium at the pharmacy and take this as prescribed (twice daily) to prevent potassium depletion related to furosemide intake.  Patient agreeable with this plan.  2.  Rheumatoid arthritis involving multiple sites and arthralgias Prednisone refilled.  She is to begin taking 20 mg (2 tablets) once daily for the next 3 days, then 10 mg daily until follow-up appointment with PCP.  Generalized mobility is at baseline.  No significant swelling or erythema to joints.  Musculoskeletal exam is stable and at baseline.  Advised to take prednisone with food and avoid use of other NSAID containing medications.  Discussed physical exam and available lab work findings in clinic with patient.  Counseled patient regarding appropriate use of medications and potential side effects for all medications recommended or prescribed today. Discussed red flag signs and symptoms of worsening condition,when to call the PCP office, return to urgent care, and when to seek higher level of care in the emergency department. Patient verbalizes understanding and agreement with plan. All questions answered. Patient discharged in stable condition.  Final Clinical Impressions(s) / UC Diagnoses   Final diagnoses:  Leg swelling  Chronic congestive heart failure, unspecified heart failure type (HCC)  Arthralgia, unspecified joint  Rheumatoid arthritis involving multiple sites, unspecified whether rheumatoid factor present [facility])     Discharge Instructions      Take prednisone 20 mg (2 tablets)  once daily for the next 3 days, then take 10 mg daily until your appointment with your primary care provider on July 29, 2022.  Take 2 Lasix tablets today, then take 1 tablet daily until your primary care appointment.  As advised by primary care in the past, you may take 2 tablets if you experience worsening shortness of breath, then go back to taking only 1 tablet/day.   Your chest x-ray showed stable findings compared to your last chest x-ray.  Purchase compression stockings over-the-counter and wear these every day.  Elevate your legs to reduce leg swelling.  Reduce the salt in your diet to help with elevated blood pressure and leg swelling as well.  If you develop any new or worsening symptoms or do not improve  in the next 2 to 3 days, please return.  If your symptoms are severe, please go to the emergency room.  Follow-up with your primary care provider for further evaluation and management of your symptoms as well as ongoing wellness visits.  I hope you feel better!     ED Prescriptions     Medication Sig Dispense Auth. Provider   furosemide (LASIX) 40 MG tablet Take 1 tablet (40 mg total) by mouth daily. 45 tablet Joella Prince M, FNP   predniSONE (DELTASONE) 10 MG tablet Take 2 tablets (20 mg total) by mouth daily with breakfast for 3 days, THEN 1 tablet (10 mg total) daily with breakfast for 23 days. 29 tablet Talbot Grumbling, FNP      PDMP not reviewed this encounter.   Talbot Grumbling, [place] 07/06/22 1034

## 2022-07-06 NOTE — ED Triage Notes (Signed)
Patient having pain all over her body, onset a week and a half. Patient having a lot of fluid build up in her chest and ankles.   Pain increasing over the last week. Patient has also fallen when trying to get up.

## 2022-07-06 NOTE — Discharge Instructions (Addendum)
Take prednisone 20 mg (2 tablets) once daily for the next 3 days, then take 10 mg daily until your appointment with your primary care provider on July 29, 2022.  Take 2 Lasix tablets today, then take 1 tablet daily until your primary care appointment.  As advised by primary care in the past, you may take 2 tablets if you experience worsening shortness of breath, then go back to taking only 1 tablet/day.   Your chest x-ray showed stable findings compared to your last chest x-ray.  Purchase compression stockings over-the-counter and wear these every day.  Elevate your legs to reduce leg swelling.  Reduce the salt in your diet to help with elevated blood pressure and leg swelling as well.  If you develop any new or worsening symptoms or do not improve in the next 2 to 3 days, please return.  If your symptoms are severe, please go to the emergency room.  Follow-up with your primary care provider for further evaluation and management of your symptoms as well as ongoing wellness visits.  I hope you feel better!

## 2022-07-07 MED ORDER — POTASSIUM CHLORIDE CRYS ER 10 MEQ PO TBCR: 10.0000 meq | EXTENDED_RELEASE_TABLET | Freq: Two times a day (BID) | ORAL | 3 refills | Status: DC

## 2022-07-27 ENCOUNTER — Encounter: Payer: Self-pay | Admitting: Internal Medicine

## 2022-07-27 ENCOUNTER — Ambulatory Visit (INDEPENDENT_AMBULATORY_CARE_PROVIDER_SITE_OTHER): Payer: Medicaid Other | Admitting: Internal Medicine

## 2022-07-27 VITALS — BP 128/76 | HR 83 | Temp 98.6°F | Ht 62.5 in | Wt 229.2 lb

## 2022-07-27 DIAGNOSIS — Z79899 Other long term (current) drug therapy: Secondary | ICD-10-CM

## 2022-07-27 DIAGNOSIS — M059 Rheumatoid arthritis with rheumatoid factor, unspecified: Secondary | ICD-10-CM

## 2022-07-27 DIAGNOSIS — J8489 Other specified interstitial pulmonary diseases: Secondary | ICD-10-CM

## 2022-07-27 DIAGNOSIS — M359 Systemic involvement of connective tissue, unspecified: Secondary | ICD-10-CM

## 2022-07-27 DIAGNOSIS — M2550 Pain in unspecified joint: Secondary | ICD-10-CM

## 2022-07-27 DIAGNOSIS — I5033 Acute on chronic diastolic (congestive) heart failure: Secondary | ICD-10-CM

## 2022-07-27 NOTE — Progress Notes (Signed)
OV 04/08/2022 -rheumatoid arthritis immunosuppressed.  Has interstitial lung disease findings.  Subjective:  Patient ID: Kathleen Kane, female , DOB: [birth year] , age 61 y.o. , MRN: [id number] , ADDRESS: [place] [id number] PCP [name], MD Patient Care Team: [name], MD as PCP - General (Internal Medicine) Josue Hector, MD as PCP - Cardiology (Cardiology)  This Provider for this visit: Treatment Team:  Attending Provider: Brand Males, MD    04/08/2022 -   Chief Complaint  Patient presents with   Consult    Pt recently had some imaging performed which is the reason for today's visit. States she was also recently in the hospital. Pt does have current complaints of cough which is worse at night.     HPI Kathleen Kane 61 y.o. -history provided by review of the chart talking to her and the daughter.  Obese lady presenting with her daughter.  She has a long history of rheumatoid arthritis diagnosed 8 years ago according to history.  She used to be a patient of Dr. Estanislado Pandy but after having failed different immunosuppression agents [efficacy issues versus side effect such as infections] she has been discharged from follow-up at the rheumatology clinic.  Recommendation was to do prednisone.  Last face-to-face visit was in December 2022 at rheumatolog wth Dr D and with the PA Hazel Sams in March 2023.  Rheumatoid arthritis history: She has RA and osteoarthritis.  Diagnosis seropositive although I could not locate seropositivity in the chart.  Charts indicate that previously she been in a great response to Plaquenil, Humira and Remicade.  Deemed poor candidate for TNF alpha inhibitors due to history of congestive heart failure.  Most recently she was on Orencia and Rasuvo but it appears that she stopped this in October 28, 2011 for "recurrent bouts of pneumonia as well as wound infection of the length of the inguinal region".  In March 2023 she  expressed concern about restarting these agents because of concern for side effects.  She expressed interest in doing long-term prednisone to rheumatology in March 2023.  Patient was advised against using long-term prednisone.  She then wanted to see a different rheumatologist.  Referral was made but according to the patient nobody will see her because she has Medicaid.  She has been on chronic steroids according to the chart review since 2015.  But she tells me that she only takes it intermittently although chart says she is wanting to take this and takes it continuously.  She also has osteoarthritis of the knees.  She has idiopathic gout.  She has rheumatoid nodules  Other pertinent past medical history include: OSA unclear if she is taking CPAP, chronic atrial fibrillation sees Dr. Nolon Lennert on Eliquis and metoprolol, chronic diastolic heart failure, type 2 diabetes, vitamin D deficiency.  She has chronic anemia.  Hemoglobin in May 2023 was 10 g% and baseline.   Current: In the back of all this she was admitted 03/11/2022 for 4 days and discharged 03/15/2022 with a history of hemoptysis for [redacted] week along with acute hypoxemic respiratory failure requiring 4 L nasal cannula.  An echocardiogram apparently showed restricted posterior mitral valve leaflet with moderate mitral regurgitation EF 55%.  [Several months ago she seemed to have a high elevated pulmonary pressures on echo].  According to her in the chart she was treated with oxygen therapy antibiotics steroids and diuresis.  She did improve.  She is off oxygen.  She says she  is also off steroids at this point.  She had a CT scan of the chest there was angiogram.  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  Therefore she has been referred here.  Her daughter states that they are aware of ILD changes even a few years ago [2019 high-resolution CT chest showed some groundglass opacities and that the only other CT scan of the chest].  At this point in  time she is back to baseline there is no more hemoptysis she is able to do ADLs.  She does use a wheelchair when she goes shopping out of the doctor's office but she is able to walk around the house.  In fact today she was able to walk 2 out of the 3 laps and stopped because of joint issues and fatigue.  She did not desaturate.   Lab review: Most recent creatinine 1 mg percent 03/15/2022  04/19/2022 Follow up : RA-ILD  Patient returns for a 2-week follow-up.  Patient was seen last visit for a pulmonary consult for interstitial lung disease.  Patient has underlying rheumatoid arthritis.  Says she has been on multiple treatments in the past including methotrexate, Humira, Orencia but is currently off of all therapy due to intolerance.  Patient says that she has been discharged from her rheumatologist recently and needs a new rheumatologist.  Patient was set up for high-resolution CT chest completed on April 16, 2022 that shows patchy pulmonary parenchymal groundglass, coarsening bronchiectasis and subpleural reticulation.  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  Mediastinal adenopathy in the setting of ILD and appears slightly improved from CT on Mar 09, 2022.  Lab work shows decrease sed rate at 37, positive CCP greater than 250, negative ANA, positive rheumatoid factor at 904, decreased BNP at 169.  QuantiFERON gold was -1-year ago most recent test showed indeterminate.  2D echo is pending.  Was admitted to the hospital early May for acute respiratory failure felt secondary to possible atypical pneumonia.  She was treated with empiric antibiotics and steroids.  Along with nebulized bronchodilators.  Weaned off of oxygen prior to discharge. Patient says overall she is actually feeling better since discharge.  She has had less joint swelling and pain.  Breathing has been better.  She says since coming off the  methotrexate she is felt significantly improved. She was set up for pulmonary function testing completed on April 13, 2022 that showed no airflow obstruction or restriction.  FEV1 116%, ratio 91, FVC 99%, positive bronchodilator response, DLCO decreased at 54%.Marland Kitchen  She was set up for an overnight oximetry test.  This is showed minimum desaturations with O2 saturations less than 88%.  7 minutes and 40 seconds.  *Test results.  Patient wants to hold off on starting oxygen.   T/EVENTS :  04/14/2022 ONO <88% for 7 minutes and 40 seconds  CT chest Mar 09, 2022 shows extensive fibrotic changes with associated bronchiectasis bilaterally   Echo 04/22/22 - IMPRESSIONS     1. Left ventricular ejection fraction, by estimation, is 60 to 65%. The  left ventricle has normal function. The left ventricle has no regional  wall motion abnormalities. There is moderate concentric left ventricular  hypertrophy. Left ventricular  diastolic parameters are indeterminate.   2. Right ventricular systolic function is normal. The right ventricular  size is normal.   3. Left atrial size was moderately dilated.   4. The mitral valve is normal  in structure. Mild mitral valve  regurgitation.   5. The aortic valve is normal in structure. Aortic valve regurgitation is  not visualized.   6. The inferior vena cava is normal in size with greater than 50%  respiratory variability, suggesting right atrial pressure of 3 mmHg.   OV 07/27/2022  Subjective:  Patient ID: Kathleen Kane, female , DOB: 1961-11-05 , age 89 y.o. 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  HPI Kathleen Kane 61 y.o. -presents with daughter Museum/gallery conservator.  She is reports continued improvement in her respiratory status particularly after stopping methotrexate in the hospitalization.  She had a CT scan of the chest a few months ago and this showed NSIP pattern of ILD.  She believes that this is stable.  However she is dealing with significant joint pain and this is making her short of breath.  She thinks her joint pain is because of rheumatoid arthritis and her weight causing shortness of breath.  She not having any cough.  The cough is significantly improved.  She is frustrated that no rheumatologist will see her.  SYMPTOM SCALE - ILD 07/27/2022  Current weight   O2 use ra  Shortness of Breath 0 -> 5 scale with 5 being worst (score 6 If unable to do)  At rest 1.1  Simple tasks - showers, clothes change, eating, shaving 2  Household (dishes, doing bed, laundry) 2  Shopping 1  Walking level at own pace 4  Walking up Stairs 4  Total (30-36) Dyspnea Score 14  How bad is your cough? Hardly coughs anymore  How bad is your fatigue No response  How bad is nausea 0  How bad is vomiting?  0  How bad is diarrhea? 0  How bad is anxiety? xx  How bad is depression xx  Any chronic pain - if so where and how bad Unable to function because of severe knee pain and shoulder pain.        Simple office walk 185 feet x  3 laps goal with forehead probe 04/08/2022    O2 used ra   Number laps completed 2 of 3 laps   Comments about pace slow   Resting Pulse Ox/HR 100% and 80/min   Final Pulse Ox/HR 98% and 148/min   Desaturated </= 88% no   Desaturated <= 3% points no   Got Tachycardic >/= 90/min Yes, A Fib   Symptoms at end of test Fatigue, pain and dyspnea    Miscellaneous comments Was in  Aifb     CT Chest data  No results found.    PFT     Latest Ref Rng & Units 04/13/2022   12:52 PM  PFT Results  FVC-Pre L 2.18   FVC-Predicted Pre % 92   FVC-Post L 2.35   FVC-Predicted Post % 99   Pre FEV1/FVC % % 80   Post FEV1/FCV % % 91  FEV1-Pre L 1.75   FEV1-Predicted Pre % 95   FEV1-Post L 2.15   DLCO uncorrected ml/min/mmHg 10.11   DLCO UNC% % 54   DLCO corrected ml/min/mmHg 11.21   DLCO COR %Predicted % 60   DLVA Predicted % 79   TLC L 3.20   TLC % Predicted % 67   RV % Predicted % -37        has a past medical history of Acute on chronic diastolic CHF (congestive heart failure) ([place]) (08/08/2015), Anxiety, Atrial fibrillation, persistent ([place]), Carpal tunnel syndrome, right (12/15/2016), CHF (congestive heart failure) ([place]) (2013), CHF exacerbation ([place]) (10/01/2018), Depression, Diabetes mellitus ([place]) (08/10/2012), Diabetes mellitus without complication ([place]), Hypertension, 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   reports that she quit smoking about 8 years ago. Her smoking use included cigarettes. She started smoking about 45 years ago. She has a 36.00 pack-year smoking history. She has never been exposed to tobacco smoke. She has never used smokeless tobacco.  Past Surgical History:  Procedure Laterality Date   CARDIOVASCULAR STRESS TEST  02/16/2013   no significant EKG changes with Lexiscan, normal LV function and normal wall function   CESAREAN SECTION     x2   DOPPLER ECHOCARDIOGRAPHY  01/24/2008   EF 55-60%, no diagnostic evidence of LV wall motion abnormalities, LV wall thickness was mild-moderately  increased.   HAND SURGERY Right 2018   LAPAROTOMY Right 02/27/2013   Procedure: EXPLORATORY LAPAROTOMY, right salpingo-oopherectomy;  Surgeon: [name], MD;  Location: [facility];  Service: Gynecology;  Laterality: Right;   left breast cyst     SALPINGOOPHORECTOMY Right 02/27/2013   Procedure: SALPINGO OOPHORECTOMY;  Surgeon: [name], MD;  Location: [facility];  Service: Gynecology;  Laterality: Right;   TUBAL LIGATION Bilateral 1989    Allergies  Allergen Reactions   Ace Inhibitors Cough   Lisinopril Cough     There is no immunization history on file for this patient.  Family History  Problem Relation Age of Onset   Hypertension Mother    Diabetes Mother    Throat cancer Mother    Hypertension Father    Diabetes Sister    Heart Problems Sister    Kidney failure Sister    Hypertension Brother    Diabetes Brother    Diabetes Paternal Grandmother    Autoimmune disease Daughter    Healthy Son      Current Outpatient Medications:    Accu-Chek FastClix Lancets MISC, Apply topically., Disp: , Rfl:    ACCU-CHEK GUIDE test strip, , Disp: , Rfl:    apixaban (ELIQUIS) 5 MG TABS tablet, TAKE 1 TABLET BY MOUTH TWICE A DAY, Disp: 60 tablet, Rfl: 10   atorvastatin (LIPITOR) 20 MG tablet, TAKE 1 TABLET (20 MG TOTAL) BY MOUTH DAILY. PLEASE SCHEDULE YEARLY APPOINTMENT FOR FUTURE REFILLS. 1ST ATTEMPT. THANK YOU, Disp: 30 tablet, Rfl: 2   B-D ULTRAFINE III SHORT PEN 31G X 8 MM MISC, Inject into the skin 2 (two) times daily., Disp: , Rfl:    blood glucose meter kit and supplies, Dispense based on patient and insurance preference. Use up to four times daily as directed. (FOR ICD-10 E10.9, E11.9)., Disp: 1 each, Rfl: 0   ferrous sulfate 325 (65 FE) MG tablet, Take 1 tablet (325 mg total) by mouth  daily with breakfast., Disp: 30 tablet, Rfl: 3   folic acid (FOLVITE) 1 MG tablet, Take 1 mg by mouth every morning., Disp: , Rfl: 3   furosemide (LASIX) 40 MG tablet, Take 1 tablet (40 mg total)  by mouth daily., Disp: 45 tablet, Rfl: 0   levalbuterol (XOPENEX HFA) 45 MCG/ACT inhaler, Inhale 1-2 puffs into the lungs every 6 (six) hours as needed for wheezing., Disp: 1 each, Rfl: 3   losartan (COZAAR) 25 MG tablet, TAKE 1 TABLET BY MOUTH EVERY DAY, Disp: 90 tablet, Rfl: 3   metFORMIN (GLUCOPHAGE) 500 MG tablet, Take 2 tablets (1,000 mg total) by mouth 2 (two) times daily., Disp: 60 tablet, Rfl: 0   metoprolol tartrate (LOPRESSOR) 25 MG tablet, Take 1 tablet (25 mg total) by mouth 2 (two) times daily., Disp: 180 tablet, Rfl: 3   NOVOLOG MIX 70/30 FLEXPEN (70-30) 100 UNIT/ML FlexPen, Inject 0.4 mLs (40 Units total) into the skin 2 (two) times daily with a meal. (Patient taking differently: Inject 40-80 Units into the skin See admin instructions. Inject 40-80 units subcutaneously once or twice daily with meals - dose is based on CBG and carbs), Disp: 15 mL, Rfl: 0   potassium chloride (KLOR-CON M) 10 MEQ tablet, Take 1 tablet (10 mEq total) by mouth 2 (two) times daily., Disp: 180 tablet, Rfl: 3   predniSONE (DELTASONE) 10 MG tablet, Take 2 tablets (20 mg total) by mouth daily with breakfast for 3 days, THEN 1 tablet (10 mg total) daily with breakfast for 23 days., Disp: 29 tablet, Rfl: 0   spironolactone (ALDACTONE) 25 MG tablet, Take 0.5 tablets (12.5 mg total) by mouth daily., Disp: 45 tablet, Rfl: 3      Objective:   Vitals:   07/27/22 1514  BP: 128/76  Pulse: 83  Temp: 98.6 F (37 C)  TempSrc: Oral  SpO2: 97%  Weight: 229 lb 3.2 oz (104 kg)  Height: 5' 2.5" (1.588 m)    Estimated body mass index is 41.25 kg/m as calculated from the following:   Height as of this encounter: 5' 2.5" (1.588 m).   Weight as of this encounter: 229 lb 3.2 oz (104 kg).  _0 @  Filed Weights   07/27/22 1514  Weight: 229 lb 3.2 oz (104 kg)     Physical Exam    General: No distress.  Obese lady sitting on the wheelchair.  Pleasant looks well. Neuro: Alert and Oriented x 3. GCS 15.  Speech normal Psych: Pleasant Resp:  Barrel Chest - no.  Wheeze - no, Crackles - no, No overt respiratory distress CVS: Normal heart sounds. Murmurs - no Ext: Stigmata of Connective Tissue Disease - RA HEENT: Normal upper airway. PEERL +. No post nasal drip        Assessment:       ICD-10-CM   1. Interstitial lung disease due to connective tissue disease ([place])  J84.89    M35.9     2. Rheumatoid arthritis, 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[place])  M05.9     3. Arthralgia, unspecified joint  M25.50          Plan:     Patient Instructions     ICD-10-CM   1. Interstitial lung disease due to connective tissue disease ([place])  J84.89    M35.9     2. Rheumatoid arthritis, 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[place])  M05.9     3. Arthralgia, unspecified joint  M25.50      Clinically you have interstitial lung disease associated with rheumatoid arthritis. You are  also having severe joint pain from rheumatoid arthritis in the absence of biological agents Your pain is severe despite being on daily prednisone 10 mg/day.  -I am concerned that increasing of prednisone will cause more side effects  Plan -Check CBC chemistry, liver function test, BNP, ESR and QuantiFERON gold - Start biologic Actemra through our pharmacist on account of active rheumatoid arthritis and interstiti -Referral pharmacist Knox Saliva for Actemra start - spiro/dlc0 in 6-8 weeks  Follow-up - Return in 6-8 weeks to see nurse practitioner or Dr Chase Caller -30 min slot but after PFT  Complex condition immunosuppressed status.  Requires intensive therapeutic monitoring with high risk prescription.  Went over the pros cons and limitations of Actemra.  After detailed discussion patient decided to accept taking the medication  SIGNATURE    Dr. Brand Males, M.D., F.C.C.P,  Pulmonary and Critical Care Medicine Staff Physician, [place] Director - Interstitial Lung Disease  Program  Pulmonary [place]  at [place], [place], [address]  Pager: [phone], If no answer or between  15:00h - 7:00h: call [phone] Telephone: [phone]  4:06 PM 07/27/2022

## 2022-07-27 NOTE — Addendum Note (Signed)
Addended by: Suzzanne Cloud E on: 07/27/2022 04:34 PM   Modules accepted: Orders

## 2022-07-27 NOTE — Patient Instructions (Addendum)
ICD-10-CM   1. Interstitial lung disease due to connective tissue disease ([place])  J84.89    M35.9     2. Rheumatoid arthritis, 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[place])  M05.9     3. Arthralgia, unspecified joint  M25.50      Clinically you have interstitial lung disease associated with rheumatoid arthritis. You are also having severe joint pain from rheumatoid arthritis in the absence of biological agents Your pain is severe despite being on daily prednisone 10 mg/day.  -I am concerned that increasing of prednisone will cause more side effects  Plan -Check CBC chemistry, liver function test, BNP, ESR and QuantiFERON gold - Start biologic Actemra through our pharmacist on account of active rheumatoid arthritis and interstiti -Referral pharmacist Knox Saliva for Actemra start - spiro/dlc0 in 6-8 weeks  Follow-up - Return in 6-8 weeks to see nurse practitioner or Dr Chase Caller -30 min slot but after PFT

## 2022-07-28 LAB — HEPATIC FUNCTION PANEL
ALT: 6 U/L (ref 0–35)
AST: 8 U/L (ref 0–37)
Albumin: 3.7 g/dL (ref 3.5–5.2)
Alkaline Phosphatase: 51 U/L (ref 39–117)
Bilirubin, Direct: 0.1 mg/dL (ref 0.0–0.3)
Total Bilirubin: 0.4 mg/dL (ref 0.2–1.2)
Total Protein: 6.7 g/dL (ref 6.0–8.3)

## 2022-07-28 LAB — BRAIN NATRIURETIC PEPTIDE: Pro B Natriuretic peptide (BNP): 238 pg/mL — ABNORMAL HIGH (ref 0.0–100.0)

## 2022-07-28 LAB — CBC WITH DIFFERENTIAL/PLATELET
Basophils Absolute: 0 10*3/uL (ref 0.0–0.1)
Basophils Relative: 0.6 % (ref 0.0–3.0)
Eosinophils Absolute: 0 10*3/uL (ref 0.0–0.7)
Eosinophils Relative: 0.6 % (ref 0.0–5.0)
HCT: 33.2 % — ABNORMAL LOW (ref 36.0–46.0)
Hemoglobin: 10.8 g/dL — ABNORMAL LOW (ref 12.0–15.0)
Lymphocytes Relative: 9.2 % — ABNORMAL LOW (ref 12.0–46.0)
Lymphs Abs: 0.7 10*3/uL (ref 0.7–4.0)
MCHC: 32.4 g/dL (ref 30.0–36.0)
MCV: 82.7 fl (ref 78.0–100.0)
Monocytes Absolute: 0.1 10*3/uL (ref 0.1–1.0)
Monocytes Relative: 1.7 % — ABNORMAL LOW (ref 3.0–12.0)
Neutro Abs: 6.5 10*3/uL (ref 1.4–7.7)
Neutrophils Relative %: 87.9 % — ABNORMAL HIGH (ref 43.0–77.0)
Platelets: 270 10*3/uL (ref 150.0–400.0)
RBC: 4.01 Mil/uL (ref 3.87–5.11)
RDW: 15.7 % — ABNORMAL HIGH (ref 11.5–15.5)
WBC: 7.4 10*3/uL (ref 4.0–10.5)

## 2022-07-28 LAB — SEDIMENTATION RATE: Sed Rate: 39 mm/hr — ABNORMAL HIGH (ref 0–30)

## 2022-07-30 ENCOUNTER — Telehealth: Payer: Self-pay | Admitting: Internal Medicine

## 2022-07-30 NOTE — Telephone Encounter (Signed)
Quant gold still pending  Anemia without change  LFT normal  Ok to start actemra pending quant gold result - let patien tknow     Latest Reference Range & Units 01/23/08 15:10 01/23/08 18:09 01/24/08 03:30 04/01/10 12:15 05/12/10 15:04 02/17/12 21:21 07/22/12 14:52 07/22/12 16:28 01/06/13 18:35 02/22/13 09:00 02/28/13 04:18 12/17/14 11:05 12/28/14 13:49 04/10/15 12:11 05/15/15 10:04 01/10/16 08:57 03/07/17 15:25 10/01/18 14:29 10/02/18 03:54 10/03/18 04:44 12/29/18 17:02 01/12/21 11:05 03/09/21 16:02 04/02/21 12:49 06/17/21 14:43 10/21/21 15:16 01/14/22 15:58 03/09/22 17:35 03/11/22 00:48 03/11/22 22:45 03/11/22 22:50 03/12/22 05:00 05/24/22 16:13 07/27/22 16:22  Hemoglobin 12.0 - 15.0 g/dL 13.5 15.3 (H) 11.9 DELTA CHECK NOTED (L) 12.9 13.1 12.4 15.3 (H) 14.2 13.0 12.0 10.8 (L) 12.5 11.1 (L) 10.0 (L) 11.4 (L) 12.1 10.8 (L) 9.9 (L) 9.8 (L) 9.3 (L) 12.9 11.0 (L) 11.0 (L) 11.7 10.7 (L) 10.3 (L) 11.7 10.3 (L) 11.6 (L) 10.6 (L) 11.6 (L) 10.9 (L) 11.9 10.8 (L)  (H): Data is abnormally high (L): Data is abnormally low   Latest Reference Range & Units 01/12/21 11:05 10/21/21 15:16 04/08/22 12:50 07/27/22 16:22  Sed Rate 0 - 30 mm/hr 38 (H) 62 (H) 37 (H) 39 (H)  (H): Data is abnormally high

## 2022-08-01 LAB — QUANTIFERON-TB GOLD PLUS
Mitogen-NIL: 0.16 IU/mL
NIL: 0.03 IU/mL
QuantiFERON-TB Gold Plus: UNDETERMINED — AB
TB1-NIL: 0 IU/mL
TB2-NIL: 0 IU/mL

## 2022-08-02 NOTE — Telephone Encounter (Signed)
Note - Repeat TB gold on 07/27/22 has resulted as INDETERMINATE again. Patient will need TB skin test per ACR recommendations which she should contact her PCP to receive or if they do not perform skin tests, she will have to complete through health department. IF negative skin test, would be able to move forward with treatment. IF positive skin test, would like need ID referral for recommendations on TB treatment.  Patient last saw Dr. Estanislado Pandy on 04/29/22 with no f/u currently scheduled. Is patient's Actemra going to be managed by Dr. Estanislado Pandy? I didn't see documentation if this was already discussed (so apologies if I overlooked it somewhere)  Knox Saliva, PharmD, MPH, BCPS, CPP Clinical Pharmacist (Rheumatology and Pulmonology)

## 2022-08-02 NOTE — Telephone Encounter (Signed)
Spoke with the pt and notified of lab results per MR  She verbalized understanding  Sending to pharm team regarding rx for Actemra

## 2022-08-04 NOTE — Telephone Encounter (Signed)
Kathleen Kane - skin test can be performed with PCP or with helath department. ID referral is needed if TB skin test results as positive  Kathleen Kane, PharmD, MPH, BCPS, CPP Clinical Pharmacist (Rheumatology and Pulmonology)

## 2022-08-04 NOTE — Telephone Encounter (Signed)
Raquel Sarna - pls refer to ID for tb skin test   Re who is doing actemra - d/w Dr Abel Presto deveshwar - she is fine with me starting actemra and she can guide [place] on labs. Later she is willing to take over. She does not want delays

## 2022-08-05 NOTE — Telephone Encounter (Signed)
Attempted to call pt but unable to reach. Left message for her to return call. 

## 2022-08-05 NOTE — Telephone Encounter (Signed)
Patient is returning phone call. Patient phone number is [phone].

## 2022-08-06 NOTE — Telephone Encounter (Signed)
Called and spoke with pt letting her know the results of the quantiferon gold blood work letting her know that the results again came back indeterminate. Stated to pt with her having multiple indeterminate results, she is now required to have a TB skin test to see what those results show. Stated to pt that if the TB skin test comes back positive, we would need to refer her to ID for TB treatment. Pt verbalized understanding.   Stated to pt that I was going to call her PCP office to see if they did TB skin test there and if they didn't stated that we would need to call the Health Dept for her to go there to have the skin test performed. Called pt's PCP office and they said that they do have the capability of doing TB skin tests there. Told them about pt's quantiferon gold blood work and the person who took my phone call said she would send a message back to the nurse for them to review. I provided my direct line in case the nurse needed to reach out to me for any reason. Called pt back to let her know that she will be able to get the TB skin test performed at PCP and she verbalized understanding.   Routing this info to Dr. Jeanie Cooks as an [place].

## 2022-08-11 ENCOUNTER — Ambulatory Visit: Payer: Medicaid Other | Admitting: Podiatry

## 2022-08-16 NOTE — Progress Notes (Deleted)
CARDIOLOGY OFFICE NOTE  Date:  08/16/2022    Kathleen Kane Date of Birth: May 26, 1961 Medical Record #[id number]  PCP:  [name], MD  Cardiologist:  Gillian Shields    No chief complaint on file.   History of Present Illness: Kathleen Kane is a 61 y.o. female who presents today for a follow up visit.   She has a history of persistent AF - on Eliquis, diastolic HF, OSA (does not use CPAP), HTN, DM, RA, obesity and prior tobacco abuse. Normal Myoview from 2014. Activity limited by RA. 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 DM poorly controlled A1c 8.0   Updated TTE 04/22/22 only mild MR and EF 60-65%   On chronic steroids since 2015 seen by pulmonary and has ILD associated with RA. She has failed other biologic agents with infections/intolerance Started on Actemra  CT 04/19/22 confirmed ILD Activity is limited by this and arthritis   ***  Past Medical History:  Diagnosis Date   Acute on chronic diastolic CHF (congestive heart failure) ([place]) 08/08/2015   Anxiety    Atrial fibrillation, persistent ([place])    Carpal tunnel syndrome, right 12/15/2016   CHF (congestive heart failure) ([place]) 2013   No echo found from that time, most likely diastolic   CHF exacerbation ([place]) 10/01/2018   Depression    Diabetes mellitus ([place]) 08/10/2012   Diabetes mellitus without complication ([place])    Hypertension    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 AHI-6.8/hr, during REM-27.3/hr. RDI-25.0/hr, during REM-40.9/hr. 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   HAND SURGERY Right 2018   LAPAROTOMY Right 02/27/2013   Procedure: EXPLORATORY LAPAROTOMY, right salpingo-oopherectomy;  Surgeon: [name], MD;  Location: [facility];  Service: Gynecology;  Laterality: Right;   left breast cyst     SALPINGOOPHORECTOMY Right 02/27/2013   Procedure: SALPINGO OOPHORECTOMY;  Surgeon: [name], MD;  Location: [facility];  Service: Gynecology;  Laterality: Right;   TUBAL LIGATION Bilateral 1989     Medications: No outpatient medications have been marked as taking for the 08/18/22 encounter (Appointment) with Josue Hector, MD.     Allergies: Allergies  Allergen Reactions   Ace Inhibitors Cough   Lisinopril Cough    Social History: The patient  reports that she quit smoking about 8 years ago. Her smoking use included cigarettes. She started smoking about 45 years ago. She has a 36.00 pack-year smoking history. She has never been exposed to tobacco smoke. She has never used smokeless tobacco. She reports that she does not drink alcohol and does  not use drugs.   Family History: The patient's family history includes Autoimmune disease in her daughter; Diabetes in her brother, mother, paternal grandmother, and sister; Healthy in her son; Heart Problems in her sister; Hypertension in her brother, father, and mother; Kidney failure in her sister; Throat cancer in her mother.   Review of Systems: Please see the history of present illness.   All other systems are reviewed and negative.   Physical Exam: VS:   LMP 10/28/2013  .  BMI There is no height or weight on file to calculate BMI.  Wt Readings from Last 3 Encounters:  07/27/22 229 lb 3.2 oz (104 kg)  07/06/22 220 lb (99.8 kg)  05/24/22 225 lb 3.2 oz (102.2 kg)   Affect appropriate Overweight black female  HEENT: normal Neck supple with no adenopathy JVP normal no bruits no thyromegaly Lungs clear with no wheezing and good diaphragmatic motion Heart:  S1/S2 apical MR  murmur, no rub, gallop or click PMI normal Abdomen: benighn, BS positve, no tenderness, no AAA no bruit.  No HSM or HJR Distal pulses intact with no bruits No edema Neuro non-focal Skin warm and dry No muscular weakness    LABORATORY DATA:  EKG:  EKG is ordered today.  Personally reviewed by me. This demonstrates AF with controlled VR - HR is 87.  Lab Results  Component Value Date   WBC 6.6 12/29/2018   HGB 12.9 12/29/2018   HCT 45.7 12/29/2018   PLT 291 12/29/2018   GLUCOSE 140 (H) 12/29/2018   CHOL  01/24/2008    155        ATP III CLASSIFICATION:  <200     mg/dL   Desirable  200-239  mg/dL   Borderline High  >=240    mg/dL   High   TRIG 141 01/24/2008   HDL 30 (L) 01/24/2008   LDLCALC  01/24/2008    97        Total Cholesterol/HDL:CHD Risk Coronary Heart Disease Risk Table                     Men   Women  1/2 Average Risk   3.4   3.3   ALT 22 12/29/2018   AST 23 12/29/2018   NA 138 12/29/2018   K 3.7 12/29/2018   CL 105 12/29/2018   CREATININE 0.85 12/29/2018   BUN 20 12/29/2018   CO2 19 (L) 12/29/2018   TSH 1.162 Test methodology is 3rd generation TSH 01/23/2008   INR 1.08 01/10/2016   HGBA1C 8.5 (H) 10/02/2018     BNP (last 3 results) Recent Labs    03/09/22 2030 03/11/22 2245  BNP 407.0* 127.8*    ProBNP (last 3 results) Recent Labs    04/08/22 1250 07/27/22 1622  PROBNP 169.0* 238.0*     Other Studies Reviewed Today:  TTE 04/22/22  IMPRESSIONS     1. Left ventricular ejection fraction, by estimation, is 60 to  65%. The  left ventricle has normal function. The left ventricle has no regional  wall motion abnormalities. There is moderate concentric left ventricular  hypertrophy. Left ventricular  diastolic parameters are indeterminate.   2. Right ventricular systolic function is normal. The right ventricular  size is normal.   3. Left atrial size was moderately dilated.   4. The mitral valve is normal in structure. Mild mitral valve  regurgitation.   5. The aortic valve is normal in structure. Aortic valve regurgitation is  not visualized.  6. The inferior vena cava is normal in size with greater than 50%  respiratory variability, suggesting right atrial pressure of 3 mmHg.    Assessment/Plan:  1. 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 HLD -  LDL 71 continue lipitor   4. Chronic diastolic dysfunction - BP is ok.  Echo EF 96-72% mild LVH diastolic indeterminate due to afib on TTE 04/22/22   5. Valvular heart disease - mild  MR by TTE 04/22/22   6. DM - poorly controlled A1c 8.0 f/u primary   7.  RA/ILD:  chronic steroids CT confirmed ESR 39 needs to f/u with pulmonary /rheumatology to see if there are other biologicals she can try like Actemra  ***   Disposition:   FU with [place] in a year      Patient is agreeable to this plan and will call if any problems develop in the interim.   Signed: Jenkins Rouge, MD  08/16/2022 3:38 PM  [facility] [address] [place], [place]  [address] Phone: [phone] Fax: [phone]

## 2022-08-18 ENCOUNTER — Encounter (HOSPITAL_COMMUNITY): Payer: Self-pay

## 2022-08-18 ENCOUNTER — Ambulatory Visit: Payer: Medicaid Other | Admitting: Cardiovascular Disease

## 2022-08-18 ENCOUNTER — Ambulatory Visit (HOSPITAL_COMMUNITY)
Admission: RE | Admit: 2022-08-18 | Discharge: 2022-08-18 | Disposition: A | Discharge status: Home / Self Care | Payer: Medicaid Other | Source: Ambulatory Visit | Attending: Internal Medicine | Admitting: Internal Medicine

## 2022-08-18 VITALS — BP 128/69 | HR 126 | Temp 98.1°F | Resp 16

## 2022-08-18 DIAGNOSIS — Z1152 Encounter for screening for COVID-19: Secondary | ICD-10-CM | POA: Insufficient documentation

## 2022-08-18 DIAGNOSIS — H6593 Unspecified nonsuppurative otitis media, bilateral: Secondary | ICD-10-CM | POA: Diagnosis present

## 2022-08-18 DIAGNOSIS — R051 Acute cough: Secondary | ICD-10-CM

## 2022-08-18 MED ORDER — AMOXICILLIN-POT CLAVULANATE 875-125 MG PO TABS: 1.0000 | ORAL_TABLET | Freq: Two times a day (BID) | ORAL | 0 refills | Status: DC

## 2022-08-18 NOTE — ED Provider Notes (Signed)
[facility]   [id number] 08/18/22 Arrival Time: 3614  ASSESSMENT & PLAN:  1. Bilateral non-suppurative otitis media   2. Acute cough    -Otitis media that has occurred status post viral upper respiratory infection.  We will test for COVID today to rule out that as a cause of the viral URI.  We will treat with Augmentin twice daily x1 week.  Recommended over-the-counter cough medicines such as Robitussin and Mucinex for symptomatic relief.  Encouraged hand hygiene and vigorous p.o. hydration.  All questions answered and she agrees to plan.  Meds ordered this encounter  Medications   amoxicillin-clavulanate (AUGMENTIN) 875-125 MG tablet    Sig: Take 1 tablet by mouth every 12 (twelve) hours.    Dispense:  14 tablet    Refill:  0     Discharge Instructions      You have an infections, take your antibiotic Augmentin twice a day for a week Get some over-the-counter cough and cold medicine We will call you if your COVID test is positive      Follow-up Information     [name], MD.   Specialty: Internal Medicine Why: If symptoms worsen Contact information: [place] [address] [place] [address] [phone]                  Reviewed expectations re: course of current medical issues. Questions answered. Outlined signs and symptoms indicating need for more acute intervention. Patient verbalized understanding. After Visit Summary given.   SUBJECTIVE: Pleasant 61 year old female comes to the urgent care to be evaluated for cough and ear pain.  She has had a cough productive of clear/yellow sputum for about 3 weeks.  The cough is worse at nights.  She has associated sore throat and ear pains bilaterally.  She reports history of ear problems with her tubes and hearing dating back to when she was 61 years old.  She reports both her ears have felt irritated and she has decreased hearing and pain behind the left ear.  She has not tried any  over-the-counter medicines.  She denies any fevers, sweats/chills, abdominal pain, nausea or vomiting.  Patient's last menstrual period was 10/28/2013. Past Surgical History:  Procedure Laterality Date   CARDIOVASCULAR STRESS TEST  02/16/2013   no significant EKG changes with Lexiscan, normal LV function and normal wall function   CESAREAN SECTION     x2   DOPPLER ECHOCARDIOGRAPHY  01/24/2008   EF 55-60%, no diagnostic evidence of LV wall motion abnormalities, LV wall thickness was mild-moderately increased.   HAND SURGERY Right 2018   LAPAROTOMY Right 02/27/2013   Procedure: EXPLORATORY LAPAROTOMY, right salpingo-oopherectomy;  Surgeon: [name], MD;  Location: [facility];  Service: Gynecology;  Laterality: Right;   left breast cyst     SALPINGOOPHORECTOMY Right 02/27/2013   Procedure: SALPINGO OOPHORECTOMY;  Surgeon: [name], MD;  Location: [facility];  Service: Gynecology;  Laterality: Right;   TUBAL LIGATION Bilateral 1989     OBJECTIVE:  Vitals:   08/18/22 0959  BP: 128/69  Pulse: (!) 126  Resp: 16  Temp: 98.1 F (36.7 C)  TempSrc: Oral  SpO2: 100%     Physical Exam Vitals reviewed.  Constitutional:      General: She is not in acute distress.    Appearance: She is not ill-appearing or toxic-appearing.  HENT:     Head: Normocephalic.     Right Ear: Tenderness present. Tympanic membrane is erythematous and bulging.     Left Ear: Tenderness  present. Tympanic membrane is erythematous and bulging.     Mouth/Throat:     Mouth: Mucous membranes are moist.     Pharynx: Oropharynx is clear. No oropharyngeal exudate or posterior oropharyngeal erythema.  Cardiovascular:     Rate and Rhythm: Normal rate.     Heart sounds: Normal heart sounds.     Comments: HR noted 126 on intake vitals, HR not tachy during exam Pulmonary:     Effort: Pulmonary effort is normal.     Breath sounds: Normal breath sounds. No rhonchi or rales.  Abdominal:     Palpations: Abdomen is  soft.  Musculoskeletal:        General: Normal range of motion.     Cervical back: Neck supple. No tenderness.  Lymphadenopathy:     Cervical: No cervical adenopathy.  Skin:    General: Skin is warm.  Neurological:     General: No focal deficit present.     Mental Status: She is alert.  Psychiatric:        Mood and Affect: Mood normal.      Labs: Results for orders placed or performed in visit on 07/27/22  Hepatic function panel  Result Value Ref Range   Total Bilirubin 0.4 0.2 - 1.2 mg/dL   Bilirubin, Direct 0.1 0.0 - 0.3 mg/dL   Alkaline Phosphatase 51 39 - 117 U/L   AST 8 0 - 37 U/L   ALT 6 0 - 35 U/L   Total Protein 6.7 6.0 - 8.3 g/dL   Albumin 3.7 3.5 - 5.2 g/dL  CBC w/Diff  Result Value Ref Range   WBC 7.4 4.0 - 10.5 K/uL   RBC 4.01 3.87 - 5.11 Mil/uL   Hemoglobin 10.8 (L) 12.0 - 15.0 g/dL   HCT 33.2 (L) 36.0 - 46.0 %   MCV 82.7 78.0 - 100.0 fl   MCHC 32.4 30.0 - 36.0 g/dL   RDW 15.7 (H) 11.5 - 15.5 %   Platelets 270.0 150.0 - 400.0 K/uL   Neutrophils Relative % 87.9 Repeated and verified X2. (H) 43.0 - 77.0 %   Lymphocytes Relative 9.2 (L) 12.0 - 46.0 %   Monocytes Relative 1.7 (L) 3.0 - 12.0 %   Eosinophils Relative 0.6 0.0 - 5.0 %   Basophils Relative 0.6 0.0 - 3.0 %   Neutro Abs 6.5 1.4 - 7.7 K/uL   Lymphs Abs 0.7 0.7 - 4.0 K/uL   Monocytes Absolute 0.1 0.1 - 1.0 K/uL   Eosinophils Absolute 0.0 0.0 - 0.7 K/uL   Basophils Absolute 0.0 0.0 - 0.1 K/uL  B Nat Peptide  Result Value Ref Range   Pro B Natriuretic peptide (BNP) 238.0 (H) 0.0 - 100.0 pg/mL  QuantiFERON-TB Gold Plus  Result Value Ref Range   QuantiFERON-TB Gold Plus INDETERMINATE (A) NEGATIVE   NIL 0.03 IU/mL   Mitogen-NIL 0.16 IU/mL   TB1-NIL 0.00 IU/mL   TB2-NIL 0.00 IU/mL  Sed Rate (ESR)  Result Value Ref Range   Sed Rate 39 (H) 0 - 30 mm/hr   Labs Reviewed  SARS CORONAVIRUS 2 (TAT 6-24 HRS)    Imaging: No results found.   Allergies  Allergen Reactions   Ace Inhibitors Cough    Lisinopril Cough                                               Past  Medical History:  Diagnosis Date   Acute on chronic diastolic CHF (congestive heart failure) ([place]) 08/08/2015   Anxiety    Atrial fibrillation, persistent ([facility])    Carpal tunnel syndrome, right 12/15/2016   CHF (congestive heart failure) ([place]) 2013   No echo found from that time, most likely diastolic   CHF exacerbation ([place]) 10/01/2018   Depression    Diabetes mellitus ([place]) 08/10/2012   Diabetes mellitus without complication ([place])    Hypertension    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 AHI-6.8/hr, during REM-27.3/hr. RDI-25.0/hr, during REM-40.9/hr. avg o2 sat during REM and NREM 95%   OSA (obstructive sleep apnea) 02/10/2017   Pelvic mass in female 01/29/2013   With ascites    Pneumonia 10/02/2018   Rheumatoid arthritis ([place])    Rheumatoid arthritis, 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[place]) 10/08/2015    Social History   Socioeconomic History   Marital status: Married    Spouse name: Kilburg,Theodore   Number of children: 2   Years of education: Not on file   Highest education level: Not on file  Occupational History   Not on file  Tobacco Use   Smoking status: Former    Packs/day: 1.00    Years: 36.00    Total pack years: 36.00    Types: Cigarettes    Start date: 10    Quit date: 06/08/2014    Years since quitting: 8.2    Passive exposure: Never   Smokeless tobacco: Never  Vaping Use   Vaping Use: Former  Substance and Sexual Activity   Alcohol use: No    Alcohol/week: 0.0 standard drinks of alcohol   Drug use: No   Sexual activity: Yes    Partners: Male  Other Topics Concern   Not on file  Social History Narrative   Not on file   Social Determinants of Health   Financial Resource Strain: Not on file  Food Insecurity: Not on  file  Transportation Needs: Not on file  Physical Activity: Not on file  Stress: Not on file  Social Connections: Not on file  Intimate Partner Violence: Not on file    Family History  Problem Relation Age of Onset   Hypertension Mother    Diabetes Mother    Throat cancer Mother    Hypertension Father    Diabetes Sister    Heart Problems Sister    Kidney failure Sister    Hypertension Brother    Diabetes Brother    Diabetes Paternal Grandmother    Autoimmune disease Daughter    Healthy Son       Loyal Holzheimer, Dorian Pod, MD 08/18/22 1101

## 2022-08-18 NOTE — Discharge Instructions (Addendum)
You have an infections, take your antibiotic Augmentin twice a day for a week Get some over-the-counter cough and cold medicine We will call you if your COVID test is positive

## 2022-08-18 NOTE — ED Triage Notes (Signed)
Pt is here for a cough [id number]

## 2022-08-19 LAB — SARS CORONAVIRUS 2 (TAT 6-24 HRS): SARS Coronavirus 2: NEGATIVE

## 2022-08-29 NOTE — Progress Notes (Unsigned)
CARDIOLOGY OFFICE NOTE  Date:  09/02/2022    [name] Date of Birth: [birth year] Medical Record #[id number]  PCP:  [name], MD  Cardiologist:  Gillian Shields    No chief complaint on file.   History of Present Illness: Kathleen Kane is a 61 y.o. female who presents today for a follow up visit.   She has a history of persistent AF - on Eliquis, diastolic HF, OSA (does not use CPAP), HTN, DM, RA, obesity and prior tobacco abuse. Normal Myoview from 2014. Activity limited by RA. 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 DM poorly controlled A1c 8.0   Updated TTE 04/22/22 only mild MR and EF 60-65%   On chronic steroids since 2015 seen by pulmonary and has ILD associated with RA. She has failed other biologic agents with infections/intolerance Started on Actemra  CT 04/19/22 confirmed ILD Activity is limited by this and arthritis   No chest pain sats ok   Past Medical History:  Diagnosis Date   Acute on chronic diastolic CHF (congestive heart failure) ([place]) 08/08/2015   Anxiety    Atrial fibrillation, persistent (HCC)    Carpal tunnel syndrome, right 12/15/2016   CHF (congestive heart failure) ([place]) 2013   No echo found from that time, most likely diastolic   CHF exacerbation ([place]) 10/01/2018   Depression    Diabetes mellitus ([place]) 08/10/2012   Diabetes mellitus without complication ([place])    Hypertension    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 AHI-6.8/hr, during REM-27.3/hr. RDI-25.0/hr, during REM-40.9/hr. avg o2 sat during REM and NREM 95%   OSA (obstructive sleep apnea) 02/10/2017   Pelvic mass in female 01/29/2013   With ascites     Pneumonia 10/02/2018   Rheumatoid arthritis ([place])    Rheumatoid arthritis, 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[place]) 10/08/2015    Past Surgical History:  Procedure Laterality Date   CARDIOVASCULAR STRESS TEST  02/16/2013   no significant EKG changes with Lexiscan, normal LV function and normal wall function   CESAREAN SECTION     x2   DOPPLER ECHOCARDIOGRAPHY  01/24/2008   EF 55-60%, no diagnostic evidence of LV wall motion abnormalities, LV wall thickness was mild-moderately increased.   HAND SURGERY Right 2018   LAPAROTOMY Right 02/27/2013   Procedure: EXPLORATORY LAPAROTOMY, right salpingo-oopherectomy;  Surgeon: [name], MD;  Location: [facility];  Service: Gynecology;  Laterality: Right;   left breast cyst     SALPINGOOPHORECTOMY Right 02/27/2013   Procedure: SALPINGO OOPHORECTOMY;  Surgeon: [name], MD;  Location: [facility];  Service: Gynecology;  Laterality: Right;   TUBAL LIGATION Bilateral 1989     Medications: Current Meds  Medication Sig   Accu-Chek FastClix Lancets MISC Apply topically.   ACCU-CHEK GUIDE test strip    amoxicillin-clavulanate (AUGMENTIN) 875-125 MG tablet Take 1 tablet by mouth every 12 (twelve) hours.   apixaban (ELIQUIS) 5 MG TABS tablet TAKE 1 TABLET BY MOUTH TWICE A DAY   atorvastatin (LIPITOR) 20 MG tablet TAKE 1 TABLET (20 MG TOTAL) BY MOUTH DAILY. PLEASE SCHEDULE YEARLY APPOINTMENT FOR FUTURE REFILLS. 1ST ATTEMPT. THANK YOU   B-D ULTRAFINE III SHORT PEN 31G X 8 MM MISC  Inject into the skin 2 (two) times daily.   blood glucose meter kit and supplies Dispense based on patient and insurance preference. Use up to four times daily as directed. (FOR ICD-10 E10.9, E11.9).   ferrous sulfate 325 (65 FE) MG tablet Take 1 tablet (325 mg total) by mouth daily with breakfast.   folic acid (FOLVITE) 1 MG tablet Take 1 mg by mouth every morning.   furosemide (LASIX) 40 MG tablet Take 1 tablet (40 mg total) by mouth daily.   levalbuterol (XOPENEX HFA) 45 MCG/ACT inhaler Inhale  1-2 puffs into the lungs every 6 (six) hours as needed for wheezing.   losartan (COZAAR) 25 MG tablet TAKE 1 TABLET BY MOUTH EVERY DAY   metFORMIN (GLUCOPHAGE) 500 MG tablet Take 2 tablets (1,000 mg total) by mouth 2 (two) times daily.   metoprolol tartrate (LOPRESSOR) 25 MG tablet Take 1 tablet (25 mg total) by mouth 2 (two) times daily.   NOVOLOG MIX 70/30 FLEXPEN (70-30) 100 UNIT/ML FlexPen Inject 0.4 mLs (40 Units total) into the skin 2 (two) times daily with a meal. (Patient taking differently: Inject 40-80 Units into the skin See admin instructions. Inject 40-80 units subcutaneously once or twice daily with meals - dose is based on CBG and carbs)   potassium chloride (KLOR-CON M) 10 MEQ tablet Take 1 tablet (10 mEq total) by mouth 2 (two) times daily.   predniSONE (DELTASONE) 10 MG tablet Take 10 mg by mouth daily.   spironolactone (ALDACTONE) 25 MG tablet Take 0.5 tablets (12.5 mg total) by mouth daily.     Allergies: Allergies  Allergen Reactions   Ace Inhibitors Cough   Lisinopril Cough    Social History: The patient  reports that she quit smoking about 8 years ago. Her smoking use included cigarettes. She started smoking about 45 years ago. She has a 36.00 pack-year smoking history. She has never been exposed to tobacco smoke. She has never used smokeless tobacco. She reports that she does not drink alcohol and does not use drugs.   Family History: The patient's family history includes Autoimmune disease in her daughter; Diabetes in her brother, mother, paternal grandmother, and sister; Healthy in her son; Heart Problems in her sister; Hypertension in her brother, father, and mother; Kidney failure in her sister; Throat cancer in her mother.   Review of Systems: Please see the history of present illness.   All other systems are reviewed and negative.   Physical Exam: VS:  BP 124/64   Pulse 77   Ht 5' 2.75" (1.594 m)   Wt 234 lb 3.2 oz (106.2 kg)   LMP 10/28/2013   SpO2 94%    BMI 41.82 kg/m  .  BMI Body mass index is 41.82 kg/m.  Wt Readings from Last 3 Encounters:  09/02/22 234 lb 3.2 oz (106.2 kg)  07/27/22 229 lb 3.2 oz (104 kg)  07/06/22 220 lb (99.8 kg)   Affect appropriate Overweight black female  HEENT: normal Neck supple with no adenopathy JVP normal no bruits no thyromegaly Lungs clear with no wheezing and good diaphragmatic motion Heart:  S1/S2 apical MR  murmur, no rub, gallop or click PMI normal Abdomen: benighn, BS positve, no tenderness, no AAA no bruit.  No HSM or HJR Distal pulses intact with no bruits No edema Neuro non-focal Skin warm and dry No muscular weakness    LABORATORY DATA:  EKG:  EKG is ordered today.  Personally reviewed by me. This demonstrates AF with controlled VR -  HR is 87.  Lab Results  Component Value Date   WBC 6.6 12/29/2018   HGB 12.9 12/29/2018   HCT 45.7 12/29/2018   PLT 291 12/29/2018   GLUCOSE 140 (H) 12/29/2018   CHOL  01/24/2008    155        ATP III CLASSIFICATION:  <200     mg/dL   Desirable  200-239  mg/dL   Borderline High  >=240    mg/dL   High   TRIG 141 01/24/2008   HDL 30 (L) 01/24/2008   LDLCALC  01/24/2008    97        Total Cholesterol/HDL:CHD Risk Coronary Heart Disease Risk Table                     Men   Women  1/2 Average Risk   3.4   3.3   ALT 22 12/29/2018   AST 23 12/29/2018   NA 138 12/29/2018   K 3.7 12/29/2018   CL 105 12/29/2018   CREATININE 0.85 12/29/2018   BUN 20 12/29/2018   CO2 19 (L) 12/29/2018   TSH 1.162 Test methodology is 3rd generation TSH 01/23/2008   INR 1.08 01/10/2016   HGBA1C 8.5 (H) 10/02/2018     BNP (last 3 results) Recent Labs    03/09/22 2030 03/11/22 2245  BNP 407.0* 127.8*    ProBNP (last 3 results) Recent Labs    04/08/22 1250 07/27/22 1622  PROBNP 169.0* 238.0*     Other Studies Reviewed Today:  TTE 04/22/22  IMPRESSIONS     1. Left ventricular ejection fraction, by estimation, is 60 to 65%. The  left  ventricle has normal function. The left ventricle has no regional  wall motion abnormalities. There is moderate concentric left ventricular  hypertrophy. Left ventricular  diastolic parameters are indeterminate.   2. Right ventricular systolic function is normal. The right ventricular  size is normal.   3. Left atrial size was moderately dilated.   4. The mitral valve is normal in structure. Mild mitral valve  regurgitation.   5. The aortic valve is normal in structure. Aortic valve regurgitation is  not visualized.   6. The inferior vena cava is normal in size with greater than 50%  respiratory variability, suggesting right atrial pressure of 3 mmHg.    Assessment/Plan:  1. 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 HLD -  LDL 71 continue lipitor   4. Chronic diastolic dysfunction - BP is ok.  Echo EF 84-16% mild LVH diastolic indeterminate due to afib on TTE 04/22/22   5. Valvular heart disease - mild  MR by TTE 04/22/22   6. DM - poorly controlled A1c 8.0 f/u primary   7.  RA/ILD:  chronic steroids CT confirmed ESR 39 needs to f/u with pulmonary /rheumatology to see if there are other biologicals she can try like Actemra    Disposition:   FU with [place] in a year      Patient is agreeable to this plan and will call if any problems develop in the interim.   Signed: Jenkins Rouge, MD  09/02/2022 10:45 AM  [facility] [address] [place], [place]  [address] Phone: [phone] Fax: [phone]

## 2022-09-02 ENCOUNTER — Ambulatory Visit: Payer: Medicaid Other | Attending: Cardiovascular Disease | Admitting: Cardiovascular Disease

## 2022-09-02 ENCOUNTER — Encounter: Payer: Self-pay | Admitting: Cardiovascular Disease

## 2022-09-02 VITALS — BP 124/64 | HR 77 | Ht 62.75 in | Wt 234.2 lb

## 2022-09-02 DIAGNOSIS — Z7901 Long term (current) use of anticoagulants: Secondary | ICD-10-CM

## 2022-09-02 DIAGNOSIS — I4811 Longstanding persistent atrial fibrillation: Secondary | ICD-10-CM

## 2022-09-02 DIAGNOSIS — E785 Hyperlipidemia, unspecified: Secondary | ICD-10-CM

## 2022-09-02 NOTE — Patient Instructions (Signed)
Medication Instructions:  Your physician recommends that you continue on your current medications as directed. Please refer to the Current Medication list given to you today.  *If you need a refill on your cardiac medications before your next appointment, please call your pharmacy*  Lab Work: If you have labs (blood work) drawn today and your tests are completely normal, you will receive your results only by: MyChart Message (if you have MyChart) OR A paper copy in the mail If you have any lab test that is abnormal or we need to change your treatment, we will call you to review the results.  Testing/Procedures: None ordered today.  Follow-Up: At [facility], you and your health needs are our priority.  As part of our continuing mission to provide you with exceptional heart care, we have created designated Provider Care Teams.  These Care Teams include your primary Cardiologist (physician) and Advanced Practice Providers (APPs -  Physician Assistants and Nurse Practitioners) who all work together to provide you with the care you need, when you need it.  We recommend signing up for the patient portal called "MyChart".  Sign up information is provided on this After Visit Summary.  MyChart is used to connect with patients for Virtual Visits (Telemedicine).  Patients are able to view lab/test results, encounter notes, upcoming appointments, etc.  Non-urgent messages can be sent to your provider as well.   To learn more about what you can do with MyChart, go to [email or web address].    Your next appointment:   1 year(s)  The format for your next appointment:   In Person  Provider:   Peter Nishan, MD     Important Information About Sugar       

## 2022-09-14 ENCOUNTER — Ambulatory Visit (INDEPENDENT_AMBULATORY_CARE_PROVIDER_SITE_OTHER): Payer: Medicaid Other | Admitting: Podiatry

## 2022-09-14 ENCOUNTER — Encounter: Payer: Self-pay | Admitting: Podiatry

## 2022-09-14 DIAGNOSIS — M79675 Pain in left toe(s): Secondary | ICD-10-CM | POA: Diagnosis not present

## 2022-09-14 DIAGNOSIS — M2141 Flat foot [pes planus] (acquired), right foot: Secondary | ICD-10-CM

## 2022-09-14 DIAGNOSIS — M79674 Pain in right toe(s): Secondary | ICD-10-CM

## 2022-09-14 DIAGNOSIS — M2142 Flat foot [pes planus] (acquired), left foot: Secondary | ICD-10-CM

## 2022-09-14 DIAGNOSIS — E1142 Type 2 diabetes mellitus with diabetic polyneuropathy: Secondary | ICD-10-CM | POA: Diagnosis not present

## 2022-09-14 DIAGNOSIS — B351 Tinea unguium: Secondary | ICD-10-CM | POA: Diagnosis not present

## 2022-09-14 NOTE — Progress Notes (Signed)
This patient returns to my office for at risk foot care.  This patient requires this care by a professional since this patient will be at risk due to having diabetes.  This patient is unable to cut nails herself since the patient cannot reach her nails.These nails are painful walking and wearing shoes. She says all her nails were remobed and only the third toenail right foot has regrown. This patient presents for at risk foot care today.  General Appearance  Alert, conversant and in no acute stress.  Vascular  Dorsalis pedis and posterior tibial  pulses are palpable  bilaterally.  Capillary return is within normal limits  bilaterally. Temperature is within normal limits  bilaterally.  Neurologic  Senn-Weinstein monofilament wire test within normal limits  bilaterally. Muscle power within normal limits bilaterally.  Nails Thick disfigured discolored nails with subungual debris  third toenail right foot. No evidence of bacterial infection or drainage bilaterally.  Orthopedic  No limitations of motion  feet .  No crepitus or effusions noted.  No bony pathology or digital deformities noted.  Skin  normotropic skin with no porokeratosis noted bilaterally.  No signs of infections or ulcers noted.     Onychomycosis  Pain in right toes  Pain in left toes  Consent was obtained for treatment procedures.   Mechanical debridement of nails 1-5  bilaterally performed with a nail nipper.  Filed with dremel without incident.    Return office visit    Dr.  Adah Perl                 Told patient to return for periodic foot care and evaluation due to potential at risk complications.   Gardiner Barefoot DPM

## 2022-09-29 ENCOUNTER — Other Ambulatory Visit: Payer: Self-pay

## 2022-09-29 ENCOUNTER — Emergency Department (HOSPITAL_COMMUNITY)
Admission: EM | Admit: 2022-09-29 | Discharge: 2022-09-29 | Disposition: A | Discharge status: Home / Self Care | Payer: Medicaid Other | Attending: Emergency Medicine | Admitting: Emergency Medicine

## 2022-09-29 ENCOUNTER — Emergency Department (HOSPITAL_COMMUNITY): Payer: Medicaid Other

## 2022-09-29 ENCOUNTER — Encounter (HOSPITAL_COMMUNITY): Payer: Self-pay

## 2022-09-29 DIAGNOSIS — R042 Hemoptysis: Secondary | ICD-10-CM | POA: Diagnosis present

## 2022-09-29 DIAGNOSIS — Z20822 Contact with and (suspected) exposure to covid-19: Secondary | ICD-10-CM | POA: Diagnosis not present

## 2022-09-29 DIAGNOSIS — R040 Epistaxis: Secondary | ICD-10-CM | POA: Insufficient documentation

## 2022-09-29 DIAGNOSIS — J189 Pneumonia, unspecified organism: Secondary | ICD-10-CM

## 2022-09-29 LAB — COMPREHENSIVE METABOLIC PANEL
ALT: 11 U/L (ref 0–44)
AST: 15 U/L (ref 15–41)
Albumin: 4.1 g/dL (ref 3.5–5.0)
Alkaline Phosphatase: 61 U/L (ref 38–126)
Anion gap: 12 (ref 5–15)
BUN: 22 mg/dL (ref 8–23)
CO2: 24 mmol/L (ref 22–32)
Calcium: 9.5 mg/dL (ref 8.9–10.3)
Chloride: 105 mmol/L (ref 98–111)
Creatinine, Ser: 0.8 mg/dL (ref 0.44–1.00)
GFR, Estimated: >60 mL/min (ref >60)
Glucose, Bld: 136 mg/dL — ABNORMAL HIGH (ref 70–99)
Potassium: 3.6 mmol/L (ref 3.5–5.1)
Sodium: 141 mmol/L (ref 135–145)
Total Bilirubin: 0.6 mg/dL (ref 0.3–1.2)
Total Protein: 8 g/dL (ref 6.5–8.1)

## 2022-09-29 LAB — CBC WITH DIFFERENTIAL/PLATELET
Abs Immature Granulocytes: 0.05 10*3/uL (ref 0.00–0.07)
Basophils Absolute: 0 10*3/uL (ref 0.0–0.1)
Basophils Relative: 0 %
Eosinophils Absolute: 0.2 10*3/uL (ref 0.0–0.5)
Eosinophils Relative: 2 %
HCT: 38.3 % (ref 36.0–46.0)
Hemoglobin: 11.8 g/dL — ABNORMAL LOW (ref 12.0–15.0)
Immature Granulocytes: 1 %
Lymphocytes Relative: 11 %
Lymphs Abs: 1.1 10*3/uL (ref 0.7–4.0)
MCH: 26.3 pg (ref 26.0–34.0)
MCHC: 30.8 g/dL (ref 30.0–36.0)
MCV: 85.3 fL (ref 80.0–100.0)
Monocytes Absolute: 0.5 10*3/uL (ref 0.1–1.0)
Monocytes Relative: 5 %
Neutro Abs: 8.2 10*3/uL — ABNORMAL HIGH (ref 1.7–7.7)
Neutrophils Relative %: 81 %
Platelets: 317 10*3/uL (ref 150–400)
RBC: 4.49 MIL/uL (ref 3.87–5.11)
RDW: 15.1 % (ref 11.5–15.5)
WBC: 10.1 10*3/uL (ref 4.0–10.5)
nRBC: 0 % (ref 0.0–0.2)

## 2022-09-29 LAB — TROPONIN I (HIGH SENSITIVITY)
Troponin I (High Sensitivity): 15 ng/L (ref <18)
Troponin I (High Sensitivity): 12 ng/L (ref <18)

## 2022-09-29 LAB — PROTIME-INR
INR: 1.4 — ABNORMAL HIGH (ref 0.8–1.2)
Prothrombin Time: 16.7 seconds — ABNORMAL HIGH (ref 11.4–15.2)

## 2022-09-29 LAB — RESP PANEL BY RT-PCR (FLU A&B, COVID) ARPGX2
Influenza A by PCR: NEGATIVE
Influenza B by PCR: NEGATIVE
SARS Coronavirus 2 by RT PCR: NEGATIVE

## 2022-09-29 MED ORDER — ACETAMINOPHEN 325 MG PO TABS
650.0000 mg | ORAL_TABLET | Freq: Once | ORAL | Status: AC
  Administered 2022-09-29: 650 mg via ORAL
  Filled 2022-09-29: qty 2

## 2022-09-29 MED ORDER — IOHEXOL 350 MG/ML SOLN
75.0000 mL | Freq: Once | INTRAVENOUS | Status: AC | PRN
  Administered 2022-09-29: 75 mL via INTRAVENOUS

## 2022-09-29 MED ORDER — AZITHROMYCIN 250 MG PO TABS: 250.0000 mg | ORAL_TABLET | Freq: Every day | ORAL | 0 refills | Status: DC

## 2022-09-29 MED ORDER — AMOXICILLIN 500 MG PO CAPS: 1000.0000 mg | ORAL_CAPSULE | Freq: Three times a day (TID) | ORAL | 0 refills | Status: AC

## 2022-09-29 MED ORDER — BENZONATATE 100 MG PO CAPS: 100.0000 mg | ORAL_CAPSULE | Freq: Three times a day (TID) | ORAL | 0 refills | Status: DC

## 2022-09-29 MED ORDER — SODIUM CHLORIDE (PF) 0.9 % IJ SOLN
INTRAMUSCULAR | Status: AC
  Filled 2022-09-29: qty 50

## 2022-09-29 NOTE — ED Provider Notes (Signed)
Signout from [place] PA-C at shift change. Briefly, patient presents for cough and hemoptysis, also with some bleeding of the nose.  She is on anticoagulation.   Plan: Awaiting CT angiography, COVID and flu testing.   5:52 PM Reassessment performed. Patient appears comfortable.  No hypoxia.  Speaking in full sentences.  Labs and imaging personally reviewed and interpreted including: CBC with white blood cell count 10.1 with 8.2 absolute neutrophil count otherwise unremarkable; CMP Leukos to 136 otherwise unremarkable; PT/INR elevated likely due to anticoagulation status; troponin 15 >> 12.   COVID and flu were negative.  Personally reviewed CT imaging of the chest, agree no pulmonary embolism, agree fibrosis versus infiltrate.  Most current vital signs reviewed and are as follows: BP (!) 155/75 (BP Location: Right Arm)   Pulse 79   Temp 98.2 F (36.8 C) (Oral)   Resp 18   Ht '5\' 2"'$  (1.575 m)   Wt 106 kg   LMP 10/28/2013   SpO2 95%   BMI 42.74 kg/m   Plan: Discharged home   Home treatment: Will treat for community-acquired pneumonia with amoxicillin and azithromycin.  Also Tessalon for cough.   Return and follow-up instructions: Encouraged return to ED with worsening shortness of breath, trouble breathing, chest pain. Encouraged patient to follow-up with their provider in 3 days. Patient verbalized understanding and agreed with plan.          Carlisle Cater, PA-C 09/29/22 1755    Davonna Belling, MD 09/29/22 [phone]

## 2022-09-29 NOTE — ED Triage Notes (Signed)
Pt reports coughing up blood since this am with shob and central cp w/o radiation.  Pt on eliquis.

## 2022-09-29 NOTE — ED Provider Notes (Signed)
[facility]   CSN: [id number] Arrival date & time: 09/29/22  1114     History  Chief Complaint  Patient presents with   Hemoptysis    Kathleen Kane is a 61 y.o. female.  Pt complains of coughing up blood.  Pt reports she has had a cough and after coughing today she began having blood in her blood.  Pt report she also had a nose bleed.  Pt reports several family members have been sick.    The history is provided by the patient. No language interpreter was used.       Home Medications Prior to Admission medications   Medication Sig Start Date End Date Taking? Authorizing Provider  Accu-Chek FastClix Lancets MISC Apply topically. 08/04/20   [provider]  ACCU-CHEK GUIDE test strip  08/14/20   [provider]  amoxicillin-clavulanate (AUGMENTIN) 875-125 MG tablet Take 1 tablet by mouth every 12 (twelve) hours. 08/18/22   Rafoth, Baldemar Friday, MD  apixaban (ELIQUIS) 5 MG TABS tablet TAKE 1 TABLET BY MOUTH TWICE A DAY 06/08/22   Wendall Stade, MD  atorvastatin (LIPITOR) 20 MG tablet TAKE 1 TABLET (20 MG TOTAL) BY MOUTH DAILY. PLEASE SCHEDULE YEARLY APPOINTMENT FOR FUTURE REFILLS. 1ST ATTEMPT. THANK YOU 01/29/22   Wendall Stade, MD  B-D ULTRAFINE III SHORT PEN 31G X 8 MM MISC Inject into the skin 2 (two) times daily. 06/30/20   [provider]  blood glucose meter kit and supplies Dispense based on patient and insurance preference. Use up to four times daily as directed. (FOR ICD-10 E10.9, E11.9). 10/03/18   Glade Lloyd, MD  ferrous sulfate 325 (65 FE) MG tablet Take 1 tablet (325 mg total) by mouth daily with breakfast. 03/15/22   Marinda Elk, MD  folic acid (FOLVITE) 1 MG tablet Take 1 mg by mouth every morning. 08/19/17   [provider]  furosemide (LASIX) 40 MG tablet Take 1 tablet (40 mg total) by mouth daily. 07/06/22   Carlisle Beers, FNP  levalbuterol [facility] HFA) 45 MCG/ACT  inhaler Inhale 1-2 puffs into the lungs every 6 (six) hours as needed for wheezing. 04/19/22   Parrett, Virgel Bouquet, NP  losartan (COZAAR) 25 MG tablet TAKE 1 TABLET BY MOUTH EVERY DAY 10/29/19   Wendall Stade, MD  metFORMIN (GLUCOPHAGE) 500 MG tablet Take 2 tablets (1,000 mg total) by mouth 2 (two) times daily. 10/03/18   Glade Lloyd, MD  metoprolol tartrate (LOPRESSOR) 25 MG tablet Take 1 tablet (25 mg total) by mouth 2 (two) times daily. 04/09/22   Wendall Stade, MD  NOVOLOG MIX 70/30 FLEXPEN (70-30) 100 UNIT/ML FlexPen Inject 0.4 mLs (40 Units total) into the skin 2 (two) times daily with a meal. Patient taking differently: Inject 40-80 Units into the skin See admin instructions. Inject 40-80 units subcutaneously once or twice daily with meals - dose is based on CBG and carbs 10/03/18   Glade Lloyd, MD  potassium chloride (KLOR-CON M) 10 MEQ tablet Take 1 tablet (10 mEq total) by mouth 2 (two) times daily. 07/07/22   Wendall Stade, MD  predniSONE (DELTASONE) 10 MG tablet Take 10 mg by mouth daily. 08/31/22   [provider]  spironolactone (ALDACTONE) 25 MG tablet Take 0.5 tablets (12.5 mg total) by mouth daily. 04/21/22   Swinyer, Zachary George, NP      Allergies    Ace inhibitors and Lisinopril    Review of Systems  Review of Systems  Respiratory:  Positive for cough.   All other systems reviewed and are negative.   Physical Exam Updated Vital Signs BP (!) 172/99   Pulse 79   Temp 98.5 F (36.9 C)   Resp 12   Ht 5\' 2"  (1.575 m)   Wt 106 kg   LMP 10/28/2013   SpO2 99%   BMI 42.74 kg/m  Physical Exam Constitutional:      Appearance: She is well-developed.  HENT:     Head: Normocephalic and atraumatic.     Nose:     Comments: Dark blood   Eyes:     Conjunctiva/sclera: Conjunctivae normal.     Pupils: Pupils are equal, round, and reactive to light.  Cardiovascular:     Rate and Rhythm: Normal rate and regular rhythm.  Pulmonary:     Effort: Pulmonary effort  is normal.     Breath sounds: Rhonchi present.     Comments: Small amount of dark blood on tissue  Abdominal:     Palpations: Abdomen is soft.     Tenderness: There is no abdominal tenderness.  Genitourinary:    Comments: Vaginal discharge,  Thick white,  Adnexa no masses,  Cervix nontender Musculoskeletal:        General: Normal range of motion.     Cervical back: Normal range of motion and neck supple.  Skin:    General: Skin is warm.  Neurological:     General: No focal deficit present.  Psychiatric:        Mood and Affect: Mood normal.     ED Results / Procedures / Treatments   Labs (all labs ordered are listed, but only abnormal results are displayed) Labs Reviewed  CBC WITH DIFFERENTIAL/PLATELET - Abnormal; Notable for the following components:      Result Value   Hemoglobin 11.8 (*)    Neutro Abs 8.2 (*)    All other components within normal limits  COMPREHENSIVE METABOLIC PANEL - Abnormal; Notable for the following components:   Glucose, Bld 136 (*)    All other components within normal limits  PROTIME-INR - Abnormal; Notable for the following components:   Prothrombin Time 16.7 (*)    INR 1.4 (*)    All other components within normal limits  TROPONIN I (HIGH SENSITIVITY)  TROPONIN I (HIGH SENSITIVITY)    EKG None  Radiology DG Chest 2 View  Result Date: 09/29/2022 CLINICAL DATA:  Cough, patient reports coughing up blood. EXAM: CHEST - 2 VIEW COMPARISON:  CT examination dated April 16, 2022 and radiographs dated July 06, 2022. FINDINGS: The heart size and mediastinal contours are within normal limits. Bilateral reticular opacities consistent with interstitial lung disease/fibrosis, unchanged. Bilateral hazy lung opacities, right greater than the left concerning for acute infectious/inflammatory process in the background of chronic lung disease. No appreciable pleural effusion. Bilateral glenohumeral osteoarthritis. No acute osseous abnormality. IMPRESSION: 1.  Bilateral hazy lung opacities, right greater than the left concerning for acute infectious/inflammatory process in the background of chronic lung disease. 2. Bilateral reticular opacities consistent with interstitial lung disease/fibrosis, unchanged. Electronically Signed   By: Larose Hires D.O.   On: 09/29/2022 12:12    Procedures Procedures    Medications Ordered in ED Medications  acetaminophen (TYLENOL) tablet 650 mg (has no administration in time range)    ED Course/ Medical Decision Making/ A&P  Medical Decision Making Pt reports nose bleed and coughing up blood.  Pt is on eliquis.  Pt's family reports pt has been taking ibuprofen for pain   Amount and/or Complexity of Data Reviewed Independent Historian:     Details: Pt here with daughter who is supportive  External Data Reviewed: notes.    Details: Hospital admission notes reviewed.  Pt has a history of afib  Labs: ordered. Decision-making details documented in ED Course.    Details: Labs ordered reviewed and interpreted  Radiology: ordered. ECG/medicine tests: ordered and independent interpretation performed. Decision-making details documented in ED Course.  Risk OTC drugs. Risk Details: Chest xray  shows possible pneumonia.  Ct angio ordered   Pt's care turned over to Rhea Bleacher [facility]            Final Clinical Impression(s) / ED Diagnoses Final diagnoses:  Hemoptysis  Bleeding from the nose    Rx / DC Orders ED Discharge Orders     None         Osie Cheeks 09/29/22 1526    Rondel Baton, MD 09/30/22 [phone]

## 2022-09-29 NOTE — ED Notes (Signed)
Blood noted on tissue from nasal bleeding per patient

## 2022-09-29 NOTE — ED Provider Triage Note (Signed)
Emergency Medicine Provider Triage Evaluation Note  Kathleen Kane , a 61 y.o. female  was evaluated in triage.  Pt complains of coughing up blood.  Pt is on eliquis.  Pt is on eliquis   Review of Systems  Positive: cough Negative: fever  Physical Exam  BP (!) 172/99   Pulse 79   Resp 12   Ht '5\' 2"'$  (1.575 m)   Wt 106 kg   LMP 10/28/2013   SpO2 99%   BMI 42.74 kg/m  Gen:   Awake, no distress   Resp:  Normal effort  MSK:   Moves extremities without difficulty  Other:    Medical Decision Making  Medically screening exam initiated at 11:57 AM.  Appropriate orders placed.  Kathleen Kane was informed that the remainder of the evaluation will be completed by another provider, this initial triage assessment does not replace that evaluation, and the importance of remaining in the ED until their evaluation is complete.     Fransico Meadow, [place] 09/29/22 1159

## 2022-09-29 NOTE — Discharge Instructions (Addendum)
Please read and follow all provided instructions.  Your diagnoses today include:  1. Hemoptysis   2. Bleeding from the nose   3. Community acquired pneumonia, unspecified laterality     Tests performed today include: Blood counts and electrolytes Chest x-ray and CT scan of the chest: Shows pulmonary fibrosis but also handset the possibility of pneumonia, no blood clots noted Cardiac enzymes: Did not show signs of stress on the heart COVID and flu testing: Was negative Vital signs. See below for your results today.   Medications prescribed:  Amoxicillin - antibiotic  You have been prescribed an antibiotic medicine: take the entire course of medicine even if you are feeling better. Stopping early can cause the antibiotic not to work.  Azithromycin - antibiotic for respiratory infection  You have been prescribed an antibiotic medicine: take the entire course of medicine even if you are feeling better. Stopping early can cause the antibiotic not to work.  Tessalon Perles - cough suppressant medication  Take any prescribed medications only as directed.  Home care instructions:  Follow any educational materials contained in this packet.  Take the complete course of antibiotics that you were prescribed.   BE VERY CAREFUL not to take multiple medicines containing Tylenol (also called acetaminophen). Doing so can lead to an overdose which can damage your liver and cause liver failure and possibly death.   Follow-up instructions: Please follow-up with your primary care provider in the next 3 days for further evaluation of your symptoms and to ensure resolution of your infection.   Return instructions:  Please return to the Emergency Department if you experience worsening symptoms.  Return immediately with worsening breathing, worsening shortness of breath, or if you feel it is taking you more effort to breathe.  Please return if you have any other emergent concerns.  Additional  Information:  Your vital signs today were: BP (!) 155/75 (BP Location: Right Arm)   Pulse 79   Temp 98.2 F (36.8 C) (Oral)   Resp 18   Ht '5\' 2"'$  (1.575 m)   Wt 106 kg   LMP 10/28/2013   SpO2 95%   BMI 42.74 kg/m  If your blood pressure (BP) was elevated above 135/85 this visit, please have this repeated by your doctor within one month. --------------

## 2022-10-19 ENCOUNTER — Ambulatory Visit (INDEPENDENT_AMBULATORY_CARE_PROVIDER_SITE_OTHER): Payer: Medicaid Other | Admitting: Internal Medicine

## 2022-10-19 ENCOUNTER — Encounter: Payer: Self-pay | Admitting: Internal Medicine

## 2022-10-19 ENCOUNTER — Telehealth: Payer: Self-pay | Admitting: Internal Medicine

## 2022-10-19 VITALS — BP 130/74 | HR 76 | Ht 62.0 in | Wt 240.6 lb

## 2022-10-19 DIAGNOSIS — M359 Systemic involvement of connective tissue, unspecified: Secondary | ICD-10-CM

## 2022-10-19 DIAGNOSIS — J8489 Other specified interstitial pulmonary diseases: Secondary | ICD-10-CM | POA: Diagnosis not present

## 2022-10-19 DIAGNOSIS — R7611 Nonspecific reaction to tuberculin skin test without active tuberculosis: Secondary | ICD-10-CM | POA: Diagnosis not present

## 2022-10-19 DIAGNOSIS — Z79899 Other long term (current) drug therapy: Secondary | ICD-10-CM | POA: Diagnosis not present

## 2022-10-19 DIAGNOSIS — M059 Rheumatoid arthritis with rheumatoid factor, unspecified: Secondary | ICD-10-CM

## 2022-10-19 LAB — PULMONARY FUNCTION TEST
DL/VA % pred: 73 %
DL/VA: 3.13 ml/min/mmHg/L
DLCO cor % pred: 55 %
DLCO cor: 10.33 ml/min/mmHg
DLCO unc % pred: 52 %
DLCO unc: 9.79 ml/min/mmHg
FEF 25-75 Pre: 3.51 L/sec
FEF2575-%Pred-Pre: 166 %
FEV1-%Pred-Pre: 78 %
FEV1-Pre: 1.79 L
FEV1FVC-%Pred-Pre: 118 %
FEV6-%Pred-Pre: 67 %
FEV6-Pre: 1.92 L
FEV6FVC-%Pred-Pre: 104 %
FVC-%Pred-Pre: 65 %
FVC-Pre: 1.94 L
Pre FEV1/FVC ratio: 92 %
Pre FEV6/FVC Ratio: 100 %

## 2022-10-19 NOTE — Telephone Encounter (Signed)
Devk and team  She says PPD negtive with PCP. Please get records ad start actemra  Thanks    SIGNATURE    Dr. Brand Males, M.D., F.C.C.P,  Pulmonary and Critical Care Medicine Staff Physician, [place] Director - Interstitial Lung Disease  Program  Medical Director - [facility] [place] [facility] [place], [place], [address]   Pager: [phone], If no answer  -[place] or [place] [phone] Telephone (clinical office): [phone] Telephone (research): [phone]  10:02 AM 10/19/2022

## 2022-10-19 NOTE — Patient Instructions (Addendum)
ICD-10-CM   1. Interstitial lung disease due to connective tissue disease ([place])  J84.89    M35.9     2. Rheumatoid arthritis, 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[place])  M05.9     3. 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  - PFT June 2023 ->June 2023 stable v mild worse You are also having severe joint pain from rheumatoid arthritis in the absence of biological agents Your pain is severe despite being on daily prednisone 10 mg/day.  -I am concerned that increasing of prednisone will cause more side effects  Glad PPD test is negative with primary care  Respect deferral of flu shot and RSV shot   Plan - Start biologic Actemra through our pharmacist on account of active rheumatoid arthritis and interstiti -Referral pharmacist Knox Saliva for Actemra start - spiro/dlco in 10-12 weeks  Follow-up - Return in 10-12 weeks to see Dr Chase Caller -30 min slot but after PFT

## 2022-10-19 NOTE — Progress Notes (Signed)
OV 04/08/2022 -rheumatoid arthritis immunosuppressed.  Has interstitial lung disease findings.  Subjective:  Patient ID: Kathleen Kane, female , DOB: [birth year] , age 61 y.o. , MRN: [id number] , ADDRESS: [place] [address] PCP [name], MD Patient Care Team: [name], MD as PCP - General (Internal Medicine) Josue Hector, MD as PCP - Cardiology (Cardiology)  This Provider for this visit: Treatment Team:  Attending Provider: Brand Males, MD    04/08/2022 -   Chief Complaint  Patient presents with   Consult    Pt recently had some imaging performed which is the reason for today's visit. States she was also recently in the hospital. Pt does have current complaints of cough which is worse at night.     HPI Kathleen Kane 61 y.o. -history provided by review of the chart talking to her and the daughter.  Obese lady presenting with her daughter.  She has a long history of rheumatoid arthritis diagnosed 8 years ago according to history.  She used to be a patient of Dr. Estanislado Pandy but after having failed different immunosuppression agents [efficacy issues versus side effect such as infections] she has been discharged from follow-up at the rheumatology clinic.  Recommendation was to do prednisone.  Last face-to-face visit was in December 2022 at rheumatolog wth Dr D and with the PA Hazel Sams in March 2023.  Rheumatoid arthritis history: She has RA and osteoarthritis.  Diagnosis seropositive although I could not locate seropositivity in the chart.  Charts indicate that previously she been in a great response to Plaquenil, Humira and Remicade.  Deemed poor candidate for TNF alpha inhibitors due to history of congestive heart failure.  Most recently she was on Orencia and Rasuvo but it appears that she stopped this in October 28, 2011 for "recurrent bouts of pneumonia as well as wound infection of the length of the inguinal region".  In March 2023  she expressed concern about restarting these agents because of concern for side effects.  She expressed interest in doing long-term prednisone to rheumatology in March 2023.  Patient was advised against using long-term prednisone.  She then wanted to see a different rheumatologist.  Referral was made but according to the patient nobody will see her because she has Medicaid.  She has been on chronic steroids according to the chart review since 2015.  But she tells me that she only takes it intermittently although chart says she is wanting to take this and takes it continuously.  She also has osteoarthritis of the knees.  She has idiopathic gout.  She has rheumatoid nodules  Other pertinent past medical history include: OSA unclear if she is taking CPAP, chronic atrial fibrillation sees Dr. Nolon Lennert on Eliquis and metoprolol, chronic diastolic heart failure, type 2 diabetes, vitamin D deficiency.  She has chronic anemia.  Hemoglobin in May 2023 was 10 g% and baseline.   Current: In the back of all this she was admitted 03/11/2022 for 4 days and discharged 03/15/2022 with a history of hemoptysis for [redacted] week along with acute hypoxemic respiratory failure requiring 4 L nasal cannula.  An echocardiogram apparently showed restricted posterior mitral valve leaflet with moderate mitral regurgitation EF 55%.  [Several months ago she seemed to have a high elevated pulmonary pressures on echo].  According to her in the chart she was treated with oxygen therapy antibiotics steroids and diuresis.  She did improve.  She is off oxygen.  She  says she is also off steroids at this point.  She had a CT scan of the chest there was angiogram.  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  Therefore she has been referred here.  Her daughter states that they are aware of ILD changes even a few years ago [2019 high-resolution CT chest showed some groundglass opacities and that the only other CT scan of the chest].  At this point  in time she is back to baseline there is no more hemoptysis she is able to do ADLs.  She does use a wheelchair when she goes shopping out of the doctor's office but she is able to walk around the house.  In fact today she was able to walk 2 out of the 3 laps and stopped because of joint issues and fatigue.  She did not desaturate.   Lab review: Most recent creatinine 1 mg percent 03/15/2022  04/19/2022 Follow up : RA-ILD  Patient returns for a 2-week follow-up.  Patient was seen last visit for a pulmonary consult for interstitial lung disease.  Patient has underlying rheumatoid arthritis.  Says she has been on multiple treatments in the past including methotrexate, Humira, Orencia but is currently off of all therapy due to intolerance.  Patient says that she has been discharged from her rheumatologist recently and needs a new rheumatologist.  Patient was set up for high-resolution CT chest completed on April 16, 2022 that shows patchy pulmonary parenchymal groundglass, coarsening bronchiectasis and subpleural reticulation.  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  Mediastinal adenopathy in the setting of ILD and appears slightly improved from CT on Mar 09, 2022.  Lab work shows decrease sed rate at 37, positive CCP greater than 250, negative ANA, positive rheumatoid factor at 904, decreased BNP at 169.  QuantiFERON gold was -1-year ago most recent test showed indeterminate.  2D echo is pending.  Was admitted to the hospital early May for acute respiratory failure felt secondary to possible atypical pneumonia.  She was treated with empiric antibiotics and steroids.  Along with nebulized bronchodilators.  Weaned off of oxygen prior to discharge. Patient says overall she is actually feeling better since discharge.  She has had less joint swelling and pain.  Breathing has been better.  She says since coming off the  methotrexate she is felt significantly improved. She was set up for pulmonary function testing completed on April 13, 2022 that showed no airflow obstruction or restriction.  FEV1 116%, ratio 91, FVC 99%, positive bronchodilator response, DLCO decreased at 54%.Marland Kitchen  She was set up for an overnight oximetry test.  This is showed minimum desaturations with O2 saturations less than 88%.  7 minutes and 40 seconds.  *Test results.  Patient wants to hold off on starting oxygen.   T/EVENTS :  04/14/2022 ONO <88% for 7 minutes and 40 seconds  CT chest Mar 09, 2022 shows extensive fibrotic changes with associated bronchiectasis bilaterally   Echo 04/22/22 - IMPRESSIONS     1. Left ventricular ejection fraction, by estimation, is 60 to 65%. The  left ventricle has normal function. The left ventricle has no regional  wall motion abnormalities. There is moderate concentric left ventricular  hypertrophy. Left ventricular  diastolic parameters are indeterminate.   2. Right ventricular systolic function is normal. The right ventricular  size is normal.   3. Left atrial size was moderately dilated.   4. The mitral valve  is normal in structure. Mild mitral valve  regurgitation.   5. The aortic valve is normal in structure. Aortic valve regurgitation is  not visualized.   6. The inferior vena cava is normal in size with greater than 50%  respiratory variability, suggesting right atrial pressure of 3 mmHg.   OV 07/27/2022  Subjective:  Patient ID: Kathleen Kane, female , DOB: 12/07/1960 , age 71 y.o. , MRN: [id number] , ADDRESS: [place] [address] PCP [name], MD Patient Care Team: [name], MD as PCP - General (Internal Medicine) Josue Hector, MD as PCP - Cardiology (Cardiology)  This Provider for this visit: Treatment Team:  Attending Provider: Brand Males, MD    07/27/2022 -   Chief Complaint  Patient presents with   Follow-up    SOB/ cough improved      HPI Kathleen Kane 61 y.o. -presents with daughter Museum/gallery conservator.  She is reports continued improvement in her respiratory status particularly after stopping methotrexate in the hospitalization.  She had a CT scan of the chest a few months ago and this showed NSIP pattern of ILD.  She believes that this is stable.  However she is dealing with significant joint pain and this is making her short of breath.  She thinks her joint pain is because of rheumatoid arthritis and her weight causing shortness of breath.  She not having any cough.  The cough is significantly improved.  She is frustrated that no rheumatologist will see her.  OV 10/19/2022  Subjective:  Patient ID: Kathleen Kane, female , DOB: 1961/07/21 , age 49 y.o. , MRN: [id number] , ADDRESS: [place] [address] PCP [name], MD Patient Care Team: [name], MD as PCP - General (Internal Medicine) Josue Hector, MD as PCP - Cardiology (Cardiology)  This Provider for this visit: Treatment Team:  Attending Provider: Brand Males, MD    10/19/2022 -   Chief Complaint  Patient presents with   Follow-up    PFT performed today.  Pt states her breathing is about the same since last visit and also has complaints of a cough.   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  - starting actemra dec 2023 (PPD neg per hx Dec 2023 - summer 2023 -indeterminate QuantiFERON gold]  HPI Kathleen Kane 61 y.o. -with rheumatoid arthritis.  Obese lady.  Presents with her daughter.  Since her last visit she continues to remain symptomatic with more shortness of breath on exertion [combination of ILD and also obesity and rheumatoid arthritis] and also severe joint pain.  She feels her respiratory status is stable.  She is not able to see her  rheumatologist anymore because of refractoriness to treatment but she is in miserable pain.  We discussed the potential of starting tocilizumab.  However her QuantiFERON gold ended up being indeterminate x 2.  So we asked her primary care physician to do a PPD skin test.  She tells me that she just had this and was negative.  She showed me her left forearm.  Was done last week.  I do not feel any induration.  Her quality of cath is limited by her pain which is quite bad and also her shortness of breath and obesity.  Her current symptom score is listed below.    SYMPTOM SCALE -  ILD 07/27/2022 10/19/2022   Current weight    O2 use ra ra  Shortness of Breath 0 -> 5 scale with 5 being worst (score 6 If unable to do)   At rest 1.1 1  Simple tasks - showers, clothes change, eating, shaving 2 3  Household (dishes, doing bed, laundry) 2 3  Shopping 1 2  Walking level at own pace 4 4  Walking up Stairs 4 4  Total (30-36) Dyspnea Score 14 17  How bad is your cough? Hardly coughs anymore 2  How bad is your fatigue No response 3  How bad is nausea 0 0  How bad is vomiting?  0 0  How bad is diarrhea? 0 0  How bad is anxiety? xx 2  How bad is depression xx 2  Any chronic pain - if so where and how bad Unable to function because of severe knee pain and shoulder pain. 4.5        Simple office walk 185 feet x  3 laps goal with forehead probe 04/08/2022    O2 used ra   Number laps completed 2 of 3 laps   Comments about pace slow   Resting Pulse Ox/HR 100% and 80/min   Final Pulse Ox/HR 98% and 148/min   Desaturated </= 88% no   Desaturated <= 3% points no   Got Tachycardic >/= 90/min Yes, A Fib   Symptoms at end of test Fatigue, pain and dyspnea   Miscellaneous comments Was in  Aifb       PFT     Latest Ref Rng & Units 10/19/2022    8:52 AM 04/13/2022   12:52 PM  PFT Results  FVC-Pre L 1.94  P 2.18   FVC-Predicted Pre % 65  P 92   FVC-Post L  2.35   FVC-Predicted Post %  99   Pre  FEV1/FVC % % 92  P 80   Post FEV1/FCV % %  91   FEV1-Pre L 1.79  P 1.75   FEV1-Predicted Pre % 78  P 95   FEV1-Post L  2.15   DLCO uncorrected ml/min/mmHg 9.79  P 10.11   DLCO UNC% % 52  P 54   DLCO corrected ml/min/mmHg 10.33  P 11.21   DLCO COR %Predicted % 55  P 60   DLVA Predicted % 73  P 79   TLC L  3.20   TLC % Predicted %  67   RV % Predicted %  -37     P Preliminary result       has a past medical history of Acute on chronic diastolic CHF (congestive heart failure) (HCC) (08/08/2015), Anxiety, Atrial fibrillation, persistent ([place]), Carpal tunnel syndrome, right (12/15/2016), CHF (congestive heart failure) ([place]) (2013), CHF exacerbation ([place]) (10/01/2018), Depression, Diabetes mellitus ([place]) (08/10/2012), Diabetes mellitus without complication ([place]), Hypertension, 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   reports that she quit smoking about 8 years ago. Her smoking use included cigarettes. She started smoking about 45 years ago. She has a 36.00 pack-year smoking history. She has never been exposed to tobacco smoke. She has never used smokeless tobacco.  Past Surgical History:  Procedure Laterality Date   CARDIOVASCULAR STRESS TEST  02/16/2013   no significant EKG changes with Lexiscan, normal LV function  and normal wall function   CESAREAN SECTION     x2   DOPPLER ECHOCARDIOGRAPHY  01/24/2008   EF 55-60%, no diagnostic evidence of LV wall motion abnormalities, LV wall thickness was mild-moderately increased.   HAND SURGERY Right 2018   LAPAROTOMY Right 02/27/2013   Procedure: EXPLORATORY LAPAROTOMY, right salpingo-oopherectomy;  Surgeon: [name], MD;   Location: [facility];  Service: Gynecology;  Laterality: Right;   left breast cyst     SALPINGOOPHORECTOMY Right 02/27/2013   Procedure: SALPINGO OOPHORECTOMY;  Surgeon: [name], MD;  Location: [facility];  Service: Gynecology;  Laterality: Right;   TUBAL LIGATION Bilateral 1989    Allergies  Allergen Reactions   Ace Inhibitors Cough   Lisinopril Cough     There is no immunization history on file for this patient.  Family History  Problem Relation Age of Onset   Hypertension Mother    Diabetes Mother    Throat cancer Mother    Hypertension Father    Diabetes Sister    Heart Problems Sister    Kidney failure Sister    Hypertension Brother    Diabetes Brother    Diabetes Paternal Grandmother    Autoimmune disease Daughter    Healthy Son      Current Outpatient Medications:    Accu-Chek FastClix Lancets MISC, Apply topically., Disp: , Rfl:    ACCU-CHEK GUIDE test strip, , Disp: , Rfl:    apixaban (ELIQUIS) 5 MG TABS tablet, TAKE 1 TABLET BY MOUTH TWICE A DAY, Disp: 60 tablet, Rfl: 10   atorvastatin (LIPITOR) 20 MG tablet, TAKE 1 TABLET (20 MG TOTAL) BY MOUTH DAILY. PLEASE SCHEDULE YEARLY APPOINTMENT FOR FUTURE REFILLS. 1ST ATTEMPT. THANK YOU, Disp: 30 tablet, Rfl: 2   azithromycin (ZITHROMAX) 250 MG tablet, Take 1 tablet (250 mg total) by mouth daily. Take first 2 tablets together, then 1 every day until finished., Disp: 6 tablet, Rfl: 0   B-D ULTRAFINE III SHORT PEN 31G X 8 MM MISC, Inject into the skin 2 (two) times daily., Disp: , Rfl:    benzonatate (TESSALON) 100 MG capsule, Take 1 capsule (100 mg total) by mouth every 8 (eight) hours., Disp: 15 capsule, Rfl: 0   blood glucose meter kit and supplies, Dispense based on patient and insurance preference. Use up to four times daily as directed. (FOR ICD-10 E10.9, E11.9)., Disp: 1 each, Rfl: 0   ferrous sulfate 325 (65 FE) MG tablet, Take 1 tablet (325 mg total) by mouth daily with breakfast., Disp: 30 tablet, Rfl: 3   folic  acid (FOLVITE) 1 MG tablet, Take 1 mg by mouth every morning., Disp: , Rfl: 3   furosemide (LASIX) 40 MG tablet, Take 1 tablet (40 mg total) by mouth daily., Disp: 45 tablet, Rfl: 0   levalbuterol (XOPENEX HFA) 45 MCG/ACT inhaler, Inhale 1-2 puffs into the lungs every 6 (six) hours as needed for wheezing., Disp: 1 each, Rfl: 3   losartan (COZAAR) 25 MG tablet, TAKE 1 TABLET BY MOUTH EVERY DAY, Disp: 90 tablet, Rfl: 3   metFORMIN (GLUCOPHAGE) 500 MG tablet, Take 2 tablets (1,000 mg total) by mouth 2 (two) times daily., Disp: 60 tablet, Rfl: 0   metoprolol tartrate (LOPRESSOR) 25 MG tablet, Take 1 tablet (25 mg total) by mouth 2 (two) times daily., Disp: 180 tablet, Rfl: 3   NOVOLOG MIX 70/30 FLEXPEN (70-30) 100 UNIT/ML FlexPen, Inject 0.4 mLs (40 Units total) into the skin 2 (two) times daily with a meal. (Patient taking differently: Inject  40-80 Units into the skin See admin instructions. Inject 40-80 units subcutaneously once or twice daily with meals - dose is based on CBG and carbs), Disp: 15 mL, Rfl: 0   potassium chloride (KLOR-CON M) 10 MEQ tablet, Take 1 tablet (10 mEq total) by mouth 2 (two) times daily., Disp: 180 tablet, Rfl: 3   predniSONE (DELTASONE) 10 MG tablet, Take 10 mg by mouth daily., Disp: , Rfl:    spironolactone (ALDACTONE) 25 MG tablet, Take 0.5 tablets (12.5 mg total) by mouth daily., Disp: 45 tablet, Rfl: 3      Objective:   Vitals:   10/19/22 0937  BP: 130/74  Pulse: 76  SpO2: 95%  Weight: 240 lb 9.6 oz (109.1 kg)  Height: _0  (1.575 m)    Estimated body mass index is 44.01 kg/m as calculated from the following:   Height as of this encounter: _1  (1.575 m).   Weight as of this encounter: 240 lb 9.6 oz (109.1 kg).  _2 @  [facility] Weights   10/19/22 0937  Weight: 240 lb 9.6 oz (109.1 kg)     Physical Exam   General: No distress. Obese. Sitting in wheel chair Neuro: Alert and Oriented x 3. GCS 15. Speech normal Psych: Pleasant Resp:  Barrel  Chest - no.  Wheeze - no, Crackles - mild yes, No overt respiratory distress CVS: Normal heart sounds. Murmurs - no Ext: Stigmata of Connective Tissue Disease - RA defomrities HEENT: Normal upper airway. PEERL +. No post nasal drip        Assessment:       ICD-10-CM   1. Interstitial lung disease due to connective tissue disease ([place])  J84.89    M35.9     2. Rheumatoid arthritis, 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[place])  M05.9     3. 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 High risk medication use  Z79.899          Plan:     Patient Instructions     ICD-10-CM   1. Interstitial lung disease due to connective tissue disease ([place])  J84.89    M35.9     2. Rheumatoid arthritis, 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[place])  M05.9     3. 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  - PFT June 2023 ->June 2023 stable v mild worse You are also having severe joint pain from rheumatoid arthritis in the absence of biological agents Your pain is severe despite being on daily prednisone 10 mg/day.  -I am concerned that increasing of prednisone will cause more side effects  Glad PPD test is negative with primary care  Respect deferral of flu shot and RSV shot   Plan - Start biologic Actemra through our pharmacist on account of active rheumatoid arthritis and interstiti -Referral pharmacist Knox Saliva for Actemra start - spiro/dlco in 10-12 weeks  Follow-up - Return in 10-12 weeks to see Dr Chase Caller -72 min slot but after PFT    SIGNATURE    Dr. Brand Males, M.D., F.C.C.P,  Pulmonary and Critical Care Medicine Staff Physician, [place] Director - Interstitial Lung Disease  Program  Pulmonary [place] at [place], [place], [address]  Pager: [phone], If  no answer or between  15:00h - 7:00h: call [phone]  [phone]  Telephone: [phone]  6:25 PM 10/19/2022

## 2022-10-19 NOTE — Telephone Encounter (Signed)
Left VM with patient to confirm PCP name as we should have Dr. Santiago Bur labs on file. Need to confirm correct PCP info is on file os I know who to call for records  Knox Saliva, PharmD, MPH, BCPS, CPP Clinical Pharmacist (Rheumatology and Pulmonology)

## 2022-10-19 NOTE — Progress Notes (Signed)
Spirometry and Dlco done today. 

## 2022-10-20 NOTE — Telephone Encounter (Signed)
Spoke with patient. She states Dr. Jeanie Cooks is not with [facility]. He is with [facility] off of [facility] (they are on lunch from 1-2 pm and close on Wednesdays at 2pm). Will have to call them tomorrow   Knox Saliva, PharmD, MPH, BCPS, CPP Clinical Pharmacist (Rheumatology and Pulmonology)

## 2022-10-22 NOTE — Telephone Encounter (Signed)
Left VM with Dr. Santiago Bur team requesting fax of skin test  Knox Saliva, PharmD, MPH, BCPS, CPP Clinical Pharmacist (Rheumatology and Pulmonology)

## 2022-10-29 ENCOUNTER — Ambulatory Visit (HOSPITAL_COMMUNITY)
Admission: EM | Admit: 2022-10-29 | Discharge: 2022-10-29 | Disposition: A | Discharge status: Home / Self Care | Payer: Medicaid Other | Attending: Family Medicine | Admitting: Family Medicine

## 2022-10-29 ENCOUNTER — Encounter (HOSPITAL_COMMUNITY): Payer: Self-pay | Admitting: Emergency Medicine

## 2022-10-29 DIAGNOSIS — M069 Rheumatoid arthritis, unspecified: Secondary | ICD-10-CM | POA: Diagnosis not present

## 2022-10-29 MED ORDER — PREDNISONE 10 MG (21) PO TBPK: ORAL_TABLET | Freq: Every day | ORAL | 0 refills | Status: DC

## 2022-10-29 NOTE — ED Triage Notes (Signed)
Pt reports has RA and for over a week having flare up in all her joints. Joints are swollen and painful. Taking Naproxen but still hurting.

## 2022-10-29 NOTE — ED Provider Notes (Signed)
[facility]    CSN: [id number] Arrival date & time: 10/29/22  1106      History   Chief Complaint Chief Complaint  Patient presents with   Joint Pain    HPI Kathleen Kane is a 61 y.o. female.   Patient presents for evaluation of generalized joint pain beginning 7 days ago.  Has attempted use of naproxen which has been ineffective.  Endorses that she has rheumatoid arthritis and symptoms have flared, currently taking 10 mg of prednisone daily.  Has upcoming appointment in January for initiation of a biologic.  Followed by rheumatology.  Denies new injury or trauma, new numbness or tingling.    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 AHI-6.8/hr, during REM-27.3/hr. RDI-25.0/hr, during REM-40.9/hr. 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   Dysphagia 01/26/2022   Personal history of colonic polyps 01/26/2022   Aortic atherosclerosis ([place]) 05/13/2021   Acute otalgia, right 11/26/2020   Arthralgia of right temporomandibular joint 11/26/2020   Bilateral impacted cerumen 11/26/2020   HCAP (healthcare-associated pneumonia) 10/02/2018   Chronic congestive heart failure ([place]) 10/01/2018   OSA (obstructive sleep apnea) 02/10/2017   Carpal tunnel syndrome, right 12/15/2016   Extensor tenosynovitis of wrist, right 12/15/2016   Bilateral primary osteoarthritis of knee 10/08/2015   Rheumatoid arthritis, 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[place]) 10/08/2015   Acute on chronic diastolic CHF (congestive heart failure) ([place]) 08/08/2015   Morbid obesity ([place]) 05/06/2015   Essential hypertension 04/10/2015   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   Primary osteoarthritis involving multiple joints 04/09/2015   Elevated uric acid in blood 01/23/2014   High risk medication use 01/23/2014   Numbness and tingling in left hand 10/01/2013   Tobacco abuse 10/01/2013   Elevated CA-125 02/06/2013   Pelvic mass in female 01/29/2013   Diabetes mellitus ([place]) 08/10/2012   Atrial fibrillation (HCC)    Chronic diastolic CHF (congestive heart failure) ([place])     Past Surgical History:  Procedure Laterality Date   CARDIOVASCULAR STRESS TEST  02/16/2013   no significant EKG changes with Lexiscan, normal LV function and normal wall function   CESAREAN SECTION     x2   DOPPLER ECHOCARDIOGRAPHY  01/24/2008   EF 55-60%, no diagnostic evidence of LV wall motion abnormalities, LV wall  thickness was mild-moderately increased.   HAND SURGERY Right 2018   LAPAROTOMY Right 02/27/2013   Procedure: EXPLORATORY LAPAROTOMY, right salpingo-oopherectomy;  Surgeon: [name], MD;  Location: [facility];  Service: Gynecology;  Laterality: Right;   left breast cyst     SALPINGOOPHORECTOMY Right 02/27/2013   Procedure: SALPINGO OOPHORECTOMY;  Surgeon:  [name], MD;  Location: [facility];  Service: Gynecology;  Laterality: Right;   TUBAL LIGATION Bilateral 1989    OB History   No obstetric history on file.      Home Medications    Prior to Admission medications   Medication Sig Start Date End Date Taking? Authorizing Provider  Accu-Chek FastClix Lancets MISC Apply topically. 08/04/20   [provider]  ACCU-CHEK GUIDE test strip  08/14/20   [provider]  apixaban (ELIQUIS) 5 MG TABS tablet TAKE 1 TABLET BY MOUTH TWICE A DAY 06/08/22   Josue Hector, MD  atorvastatin (LIPITOR) 20 MG tablet TAKE 1 TABLET (20 MG TOTAL) BY MOUTH DAILY. PLEASE SCHEDULE YEARLY APPOINTMENT FOR FUTURE REFILLS. 1ST ATTEMPT. [place] YOU 01/29/22   Josue Hector, MD  azithromycin (ZITHROMAX) 250 MG tablet Take 1 tablet (250 mg total) by mouth daily. Take first 2 tablets together, then 1 every day until finished. 09/29/22   Carlisle Cater, PA-C  B-D ULTRAFINE III SHORT PEN 31G X 8 MM MISC Inject into the skin 2 (two) times daily. 06/30/20   [provider]  benzonatate (TESSALON) 100 MG capsule Take 1 capsule (100 mg total) by mouth every 8 (eight) hours. 09/29/22   Carlisle Cater, PA-C  blood glucose meter kit and supplies Dispense based on patient and insurance preference. Use up to four times daily as directed. (FOR ICD-10 E10.9, E11.9). 10/03/18   Aline August, MD  ferrous sulfate 325 (65 FE) MG tablet Take 1 tablet (325 mg total) by mouth daily with breakfast. 03/15/22   Charlynne Cousins, MD  folic acid (FOLVITE) 1 MG tablet Take 1 mg by mouth every morning. 08/19/17   [provider]  furosemide (LASIX) 40 MG tablet Take 1 tablet (40 mg total) by mouth daily. 07/06/22   Talbot Grumbling, FNP  levalbuterol [facility] HFA) 45 MCG/ACT inhaler Inhale 1-2 puffs into the lungs every 6 (six) hours as needed for wheezing. 04/19/22   Parrett, Fonnie Mu, NP  losartan (COZAAR) 25 MG tablet TAKE 1 TABLET BY MOUTH EVERY DAY 10/29/19    Josue Hector, MD  metFORMIN (GLUCOPHAGE) 500 MG tablet Take 2 tablets (1,000 mg total) by mouth 2 (two) times daily. 10/03/18   Aline August, MD  metoprolol tartrate (LOPRESSOR) 25 MG tablet Take 1 tablet (25 mg total) by mouth 2 (two) times daily. 04/09/22   Josue Hector, MD  NOVOLOG MIX 70/30 FLEXPEN (70-30) 100 UNIT/ML FlexPen Inject 0.4 mLs (40 Units total) into the skin 2 (two) times daily with a meal. Patient taking differently: Inject 40-80 Units into the skin See admin instructions. Inject 40-80 units subcutaneously once or twice daily with meals - dose is based on CBG and carbs 10/03/18   Aline August, MD  potassium chloride (KLOR-CON M) 10 MEQ tablet Take 1 tablet (10 mEq total) by mouth 2 (two) times daily. 07/07/22   Josue Hector, MD  predniSONE (DELTASONE) 10 MG tablet Take 10 mg by mouth daily. 08/31/22   [provider]  spironolactone (ALDACTONE) 25 MG tablet Take 0.5 tablets (12.5 mg total) by mouth daily. 04/21/22   Swinyer, Lanice Schwab,  NP    Family History Family History  Problem Relation Age of Onset   Hypertension Mother    Diabetes Mother    Throat cancer Mother    Hypertension Father    Diabetes Sister    Heart Problems Sister    Kidney failure Sister    Hypertension Brother    Diabetes Brother    Diabetes Paternal Grandmother    Autoimmune disease Daughter    Healthy Son     Social History Social History   Tobacco Use   Smoking status: Former    Packs/day: 1.00    Years: 36.00    Total pack years: 36.00    Types: Cigarettes    Start date: 78    Quit date: 06/08/2014    Years since quitting: 8.3    Passive exposure: Never   Smokeless tobacco: Never  Vaping Use   Vaping Use: Former  Substance Use Topics   Alcohol use: No    Alcohol/week: 0.0 standard drinks of alcohol   Drug use: No     Allergies   Ace inhibitors and Lisinopril   Review of Systems Review of Systems   Physical Exam Triage Vital Signs ED Triage Vitals   Enc Vitals Group     BP 10/29/22 1218 (!) 145/64     Pulse Rate 10/29/22 1218 82     Resp 10/29/22 1218 20     Temp 10/29/22 1218 98.2 F (36.8 C)     Temp Source 10/29/22 1218 Oral     SpO2 10/29/22 1218 95 %     Weight --      Height --      Head Circumference --      Peak Flow --      Pain Score 10/29/22 1217 10     Pain Loc --      Pain Edu? --      Excl. in [place]? --    No data found.  Updated Vital Signs BP (!) 145/64 (BP Location: Right Arm)   Pulse 82   Temp 98.2 F (36.8 C) (Oral)   Resp 20   LMP 10/28/2013   SpO2 95%   Visual Acuity Right Eye Distance:   Left Eye Distance:   Bilateral Distance:    Right Eye Near:   Left Eye Near:    Bilateral Near:     Physical Exam Constitutional:      Appearance: Normal appearance.  Eyes:     Extraocular Movements: Extraocular movements intact.  Pulmonary:     Effort: Pulmonary effort is normal.  Musculoskeletal:     Comments: Generalized joint pain and swelling without point tenderness  Neurological:     Mental Status: She is alert and oriented to person, place, and time. Mental status is at baseline.      UC Treatments / Results  Labs (all labs ordered are listed, but only abnormal results are displayed) Labs Reviewed - No data to display  EKG   Radiology No results found.  Procedures Procedures (including critical care time)  Medications Ordered in UC Medications - No data to display  Initial Impression / Assessment and Plan / UC Course  I have reviewed the triage vital signs and the nursing notes.  Pertinent labs & imaging results that were available during my care of the patient were reviewed by me and considered in my medical decision making (see chart for details).  Rheumatoid arthritis flare  Patient has had success with prednisone taper in the past,  prescribed, recommended continued supportive maintenance.  With ice and heat, rest and activity as tolerated, advised to keep upcoming  appointment for new medication to follow-up with primary doctor or rheumatologist if symptoms continue to persist or worsen Final Clinical Impressions(s) / UC Diagnoses   Final diagnoses:  None   Discharge Instructions   None    ED Prescriptions   None    PDMP not reviewed this encounter.   Hans Eden, NP 10/29/22 1256

## 2022-10-29 NOTE — Discharge Instructions (Signed)
Today you are being treated for your rheumatoid arthritis flare  Begin prednisone as directed, this helps to reduce inflammation and should help with your pain, temporarily stop your 10 mg dosage, restart  after use  May use ice or heat over the affected area in 10 to 15-minute intervals  If your pain continues to persist please follow-up with your primary doctor for reevaluation

## 2022-11-04 NOTE — Telephone Encounter (Signed)
Called Dr. Santiago Bur office to request record of PPD skin test. Per office, they faxed it twice. I requested they refax to my On[employer]. She sttes it was negative,  Phone: [phone]  Will run PA for Actemra.  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

## 2022-11-04 NOTE — Telephone Encounter (Signed)
Received fax of PPD skin test result completed on 10/06/22 and read on 10/08/2022. Loaded into media tab of chart.  Submitted an EXPEDITED Prior Authorization request to [place] for [facility] via CoverMyMeds. Will update once we receive a response.  Key: [id number]  Knox Saliva, PharmD, MPH, BCPS, CPP Clinical Pharmacist (Rheumatology and Pulmonology)

## 2022-11-05 ENCOUNTER — Other Ambulatory Visit (HOSPITAL_COMMUNITY): Payer: Self-pay

## 2022-11-05 MED ORDER — ACTEMRA ACTPEN 162 MG/0.9ML ~~LOC~~ SOAJ
162.0000 mg | SUBCUTANEOUS | 0 refills | Status: DC
  Filled 2022-11-05: qty 3.6, 28d supply, fill #0

## 2022-11-05 NOTE — Telephone Encounter (Signed)
Delivery instructions have been updated in [place], medication will be filled and prepped for courier to [facility] on 11/09/22.  Rx adjudication deferred per new process at [facility]. Will need to verify rx is being correctly processed on 1/2.

## 2022-11-05 NOTE — Telephone Encounter (Signed)
Received notification from [place] regarding a prior authorization for Kathleen Kane. Authorization has been APPROVED from 11/04/22 to 11/05/23. Approval letter sent to scan center.  Per test claim, copay for 28 days supply is $4  Patient can fill through [facility]: [phone]   Authorization # [id number]  Rx sent to [facility] today to be courired to clinic by 11/10/2022. IF unable to get to clinic by then, will r/s appt  Knox Saliva, PharmD, MPH, BCPS, CPP Clinical Pharmacist (Rheumatology and Pulmonology)

## 2022-11-09 ENCOUNTER — Other Ambulatory Visit (HOSPITAL_COMMUNITY): Payer: Self-pay

## 2022-11-10 ENCOUNTER — Other Ambulatory Visit (HOSPITAL_COMMUNITY): Payer: Self-pay

## 2022-11-10 ENCOUNTER — Other Ambulatory Visit: Payer: Medicaid Other | Admitting: Pharmacist

## 2022-11-10 NOTE — Telephone Encounter (Signed)
Patient r/s for Actemra new start on 11/16/2022 after PFT. Medication not yet received from [facility] so did not want to prevent any hiccups at visit.  Knox Saliva, PharmD, MPH, BCPS, CPP Clinical Pharmacist (Rheumatology and Pulmonology)

## 2022-11-15 ENCOUNTER — Other Ambulatory Visit: Payer: Self-pay | Admitting: Internal Medicine

## 2022-11-15 DIAGNOSIS — M359 Systemic involvement of connective tissue, unspecified: Secondary | ICD-10-CM

## 2022-11-16 ENCOUNTER — Other Ambulatory Visit (HOSPITAL_COMMUNITY): Payer: Self-pay

## 2022-11-16 ENCOUNTER — Ambulatory Visit: Payer: Medicaid Other | Admitting: Pharmacist

## 2022-11-16 ENCOUNTER — Ambulatory Visit (INDEPENDENT_AMBULATORY_CARE_PROVIDER_SITE_OTHER): Payer: Medicaid Other | Admitting: Internal Medicine

## 2022-11-16 DIAGNOSIS — J8489 Other specified interstitial pulmonary diseases: Secondary | ICD-10-CM

## 2022-11-16 DIAGNOSIS — M059 Rheumatoid arthritis with rheumatoid factor, unspecified: Secondary | ICD-10-CM

## 2022-11-16 DIAGNOSIS — Z7189 Other specified counseling: Secondary | ICD-10-CM

## 2022-11-16 DIAGNOSIS — M359 Systemic involvement of connective tissue, unspecified: Secondary | ICD-10-CM | POA: Diagnosis not present

## 2022-11-16 DIAGNOSIS — Z79899 Other long term (current) drug therapy: Secondary | ICD-10-CM

## 2022-11-16 DIAGNOSIS — Z5181 Encounter for therapeutic drug level monitoring: Secondary | ICD-10-CM

## 2022-11-16 LAB — PULMONARY FUNCTION TEST
DL/VA % pred: 80 %
DL/VA: 3.43 ml/min/mmHg/L
DLCO cor % pred: 57 %
DLCO cor: 10.7 ml/min/mmHg
DLCO unc % pred: 54 %
DLCO unc: 10.13 ml/min/mmHg
FEF 25-75 Pre: 2.65 L/sec
FEF2575-%Pred-Pre: 126 %
FEV1-%Pred-Pre: 70 %
FEV1-Pre: 1.62 L
FEV1FVC-%Pred-Pre: 115 %
FEV6-%Pred-Pre: 63 %
FEV6-Pre: 1.81 L
FEV6FVC-%Pred-Pre: 104 %
FVC-%Pred-Pre: 60 %
FVC-Pre: 1.81 L
Pre FEV1/FVC ratio: 90 %
Pre FEV6/FVC Ratio: 100 %

## 2022-11-16 MED ORDER — ACTEMRA ACTPEN 162 MG/0.9ML ~~LOC~~ SOAJ
162.0000 mg | SUBCUTANEOUS | 1 refills | Status: DC
  Filled 2022-11-16 – 2022-12-01 (×2): qty 3.6, 28d supply, fill #0
  Filled 2022-12-30: qty 3.6, 28d supply, fill #1

## 2022-11-16 NOTE — Patient Instructions (Addendum)
Your next ACTEMRA dose is due on 11/23/2022, 11/30/2022, and every 7 days thereafter  CONTINUE prednisone '10mg'$  daily for now  HOLD ACTEMRA if you have signs or symptoms of an infection. You can resume once you feel better or back to your baseline. HOLD ACTEMRA if you start antibiotics to treat an infection. HOLD ACTEMRA around the time of surgery/procedures. Your surgeon will be able to provide recommendations on when to hold BEFORE and when you are cleared to [place].  Pharmacy information: Your prescription will be shipped from [facility]. Their phone number is [phone] They will call to schedule shipment and confirm address. They will mail your medication to your home.  Labs are due in 1 month then every 3 months. Call our clinic to ensure our lab tech is on site [phone]  How to manage an injection site reaction: Remember the 5 C's: COUNTER - leave on the counter at least 30 minutes but up to overnight to bring medication to room temperature. This may help prevent stinging COLD - place something cold (like an ice gel pack or cold water bottle) on the injection site just before cleansing with alcohol. This may help reduce pain CLARITIN - use Claritin (generic name is loratadine) for the first two weeks of treatment or the day of, the day before, and the day after injecting. This will help to minimize injection site reactions CORTISONE CREAM - apply if injection site is irritated and itching CALL ME - if injection site reaction is bigger than the size of your fist, looks infected, blisters, or if you develop hives

## 2022-11-16 NOTE — Progress Notes (Signed)
Spirometry and DLCO Performed Today.  

## 2022-11-16 NOTE — Patient Instructions (Signed)
Spirometry and DLCO Performed Today.  

## 2022-11-16 NOTE — Progress Notes (Signed)
Pharmacy Note Subjective:  Ms. Rincon presents today to [facility] for follow up Actemra counseling and to start Actemra. She is accompanied by her daughter, Museum/gallery conservator. 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 For RA, she is only taking prednisone '10mg'$  daily.  She has previously taken MTX and prednisone since 2015. She had inadequate response to Humira and Remicade. Her daughter used to administer her Humira. Her daughter currently administers insulin for patient.  History of hyperlipidemia: No  History of diverticulitis: No  Objective: CBC    Component Value Date/Time   WBC 10.1 09/29/2022 1329   RBC 4.49 09/29/2022 1329   HGB 11.8 (L) 09/29/2022 1329   HGB 11.9 05/24/2022 1613   HCT 38.3 09/29/2022 1329   HCT 35.5 05/24/2022 1613   PLT 317 09/29/2022 1329   PLT 299 05/24/2022 1613   MCV 85.3 09/29/2022 1329   MCV 81 05/24/2022 1613   MCH 26.3 09/29/2022 1329   MCHC 30.8 09/29/2022 1329   RDW 15.1 09/29/2022 1329   RDW 15.3 05/24/2022 1613   LYMPHSABS 1.1 09/29/2022 1329   MONOABS 0.5 09/29/2022 1329   EOSABS 0.2 09/29/2022 1329   BASOSABS 0.0 09/29/2022 1329    CMP     Component Value Date/Time   NA 141 09/29/2022 1329   NA 143 05/24/2022 1613   K 3.6 09/29/2022 1329   CL 105 09/29/2022 1329   CO2 24 09/29/2022 1329   GLUCOSE 136 (H) 09/29/2022 1329   BUN 22 09/29/2022 1329   BUN 29 (H) 05/24/2022 1613   CREATININE 0.80 09/29/2022 1329   CREATININE 0.79 01/14/2022 1558   CALCIUM 9.5 09/29/2022 1329   PROT 8.0 09/29/2022 1329   PROT 6.9 05/24/2022 1613   ALBUMIN 4.1 09/29/2022 1329   ALBUMIN 4.3 05/24/2022 1613   AST 15 09/29/2022 1329   ALT 11 09/29/2022 1329   ALKPHOS 61 09/29/2022 1329   BILITOT 0.6 09/29/2022 1329   BILITOT 0.4 05/24/2022 1613   GFRNONAA >60 09/29/2022 1329   GFRNONAA 44 (L) 04/02/2021 1249   GFRAA 51 (L) 04/02/2021 1249     Baseline Immunosuppressant Therapy Labs      Latest Ref Rng & Units 07/27/2022    4:22 PM  Quantiferon TB Gold  Quantiferon TB Gold Plus NEGATIVE INDETERMINATE        Latest Ref Rng & Units 01/12/2021   11:05 AM  Hepatitis  Hep B Surface Ag NON-REACTI NON-REACTIVE   Hep B IgM NON-REACTI NON-REACTIVE   Hep C Ab NON-REACTI NON-REACTIVE   Hep A IgM NON-REACTI NON-REACTIVE     Lab Results  Component Value Date   HIV Non Reactive 03/11/2022   HIV NON-REACTIVE 01/12/2021   HIV Non Reactive 10/02/2018       Latest Ref Rng & Units 01/12/2021   11:05 AM  Immunoglobulin Electrophoresis  IgA  70 - 320 mg/dL 335   IgG 600 - 1,540 mg/dL 918   IgM 50 - 300 mg/dL 221        Latest Ref Rng & Units 09/29/2022    1:29 PM  Serum Protein Electrophoresis  Total Protein 6.5 - 8.1 g/dL 8.0     Lipid Panel Lab Results  Component Value Date   CHOL 137 05/24/2022   HDL 49 05/24/2022   LDLCALC 71 05/24/2022   TRIG 88 05/24/2022   CHOLHDL 2.8 05/24/2022     Chest x-ray: 07/06/2022 - Mild diffuse interstitial densities are noted  bilaterally which may represent chronic interstitial lung disease, but acute superimposed edema or inflammation cannot be excluded.  Assessment/Plan:  Counseled patient that Actemra is an IL-6 blocking agent.  Counseled patient on purpose, proper use, and adverse effects of Actemra.  Reviewed the most common adverse effects including infections, infusion reactions, bowel injury, and rarely cancer and conditions of the nervous system.  Reviewed risk of lipid abnormalities and elevated liver enzymes. Discussed that there is the possibility of an increased risk of malignancy but it is not well understood if this increased risk is due to the medication or the disease state. Counseled patient that Actemra should be held prior to scheduled surgery for infections or new antibiotic use. Counseled patient that Actemra should be held prior to scheduled surgery. Recommend annual influenza, PCV 15 or PCV20 or Pneumovax 23, and  Shingrix as indicated.  Reviewed the importance of regular labs while on Actemra therapy including the need for routine lipid panel.  Will recheck lipid panel after 4-8 weeks of starting Actemra, then every 6 months routinely. CBC and CMP will be monitored every 3 months. TB gold will be monitored annually. Standing orders placed.  Provided patient with medication education material and answered all questions.  Patient voiced understanding.  Patient verbally consented to Actemra. Reviewed storage instructions of Actemra with patient.    Patient's daughter administer Actemra Actpen '162mg'$ /0.63m pen in back of left arm using [facility]-supplied medication: NDC: [id number] 06/2024 Lot: [id number] She will continue Actemra '162mg'$  SQ every 7 days in combination with prednisone '10mg'$  daily. She is aware that we may be able to reduce her prednisone dose in future but Actemra benefit is not seen immediately  Patient will continue Actemra 162 mg every 7 days based on weight >100 kg . Patient is high-risk for infection and has been advised to hold Actemra with any signs/symptoms of infection. If recurrent infection may have to space Actemra to every 14 days or otherwise discontinue medication.  This appointment required 45 minutes of patient care (this includes precharting, chart review, review of results, face-to-face care, etc.).   DKnox Saliva PharmD, MPH, BCPS, CPP Clinical Pharmacist (Rheumatology and Pulmonology)

## 2022-11-18 ENCOUNTER — Ambulatory Visit (INDEPENDENT_AMBULATORY_CARE_PROVIDER_SITE_OTHER): Payer: Medicaid Other | Admitting: Internal Medicine

## 2022-11-18 ENCOUNTER — Encounter: Payer: Self-pay | Admitting: Internal Medicine

## 2022-11-18 VITALS — BP 142/74 | HR 88 | Temp 98.6°F | Ht 62.5 in | Wt 237.6 lb

## 2022-11-18 DIAGNOSIS — J8489 Other specified interstitial pulmonary diseases: Secondary | ICD-10-CM | POA: Diagnosis not present

## 2022-11-18 DIAGNOSIS — M059 Rheumatoid arthritis with rheumatoid factor, unspecified: Secondary | ICD-10-CM

## 2022-11-18 DIAGNOSIS — M359 Systemic involvement of connective tissue, unspecified: Secondary | ICD-10-CM

## 2022-11-18 DIAGNOSIS — G8929 Other chronic pain: Secondary | ICD-10-CM

## 2022-11-18 DIAGNOSIS — Z79899 Other long term (current) drug therapy: Secondary | ICD-10-CM

## 2022-11-18 DIAGNOSIS — Z5181 Encounter for therapeutic drug level monitoring: Secondary | ICD-10-CM | POA: Diagnosis not present

## 2022-11-18 NOTE — Patient Instructions (Addendum)
ICD-10-CM   1. Interstitial lung disease due to connective tissue disease ([place])  J84.89    M35.9     2. Rheumatoid arthritis, 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[place])  M05.9     3. 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  - PFT June 2023 ->Jan 2024 stable v mild worse You are also having severe joint pain from rheumatoid arthritis in the absence of biological agents Your pain is severe despite being on daily prednisone 10 mg/day.  -I am concerned that increasing of prednisone will cause more side effects  Now on treatment with Actemra since January 2024 weekly injections and tolerating it well but so far no improvement in pain after first dose   Plan -Continue biologic Actemra  -Expect pain improvement in 1-3 months - spiro/dlco in 12 weeks    Follow-up - Return in 12 weeks to see Dr Chase Caller -30 min slot but after PFT  -Symptom score at follow-up  - wil check safety labs cbc, bmet, mag, phos, LFT next vist  = Timing of follow-up CT scan to be decided at follow-up

## 2022-11-18 NOTE — Progress Notes (Signed)
OV 04/08/2022 -rheumatoid arthritis immunosuppressed.  Has interstitial lung disease findings.  Subjective:  Patient ID: Kathleen Kane, female , DOB: [birth year] , age 62 y.o. , MRN: [id number] , ADDRESS: [place] [id number] PCP [name], MD Patient Care Team: [name], MD as PCP - General (Internal Medicine) Josue Hector, MD as PCP - Cardiology (Cardiology)  This Provider for this visit: Treatment Team:  Attending Provider: Brand Males, MD    04/08/2022 -   Chief Complaint  Patient presents with   Consult    Pt recently had some imaging performed which is the reason for today's visit. States she was also recently in the hospital. Pt does have current complaints of cough which is worse at night.     HPI Kathleen Kane 62 y.o. -history provided by review of the chart talking to her and the daughter.  Obese lady presenting with her daughter.  She has a long history of rheumatoid arthritis diagnosed 8 years ago according to history.  She used to be a patient of Dr. Estanislado Pandy but after having failed different immunosuppression agents [efficacy issues versus side effect such as infections] she has been discharged from follow-up at the rheumatology clinic.  Recommendation was to do prednisone.  Last face-to-face visit was in December 2022 at rheumatolog wth Dr D and with the PA Hazel Sams in March 2023.  Rheumatoid arthritis history: She has RA and osteoarthritis.  Diagnosis seropositive although I could not locate seropositivity in the chart.  Charts indicate that previously she been in a great response to Plaquenil, Humira and Remicade.  Deemed poor candidate for TNF alpha inhibitors due to history of congestive heart failure.  Most recently she was on Orencia and Rasuvo but it appears that she stopped this in October 28, 2011 for "recurrent bouts of pneumonia as well as wound infection of the length of the inguinal region".  In March 2023  she expressed concern about restarting these agents because of concern for side effects.  She expressed interest in doing long-term prednisone to rheumatology in March 2023.  Patient was advised against using long-term prednisone.  She then wanted to see a different rheumatologist.  Referral was made but according to the patient nobody will see her because she has Medicaid.  She has been on chronic steroids according to the chart review since 2015.  But she tells me that she only takes it intermittently although chart says she is wanting to take this and takes it continuously.  She also has osteoarthritis of the knees.  She has idiopathic gout.  She has rheumatoid nodules  Other pertinent past medical history include: OSA unclear if she is taking CPAP, chronic atrial fibrillation sees Dr. Nolon Lennert on Eliquis and metoprolol, chronic diastolic heart failure, type 2 diabetes, vitamin D deficiency.  She has chronic anemia.  Hemoglobin in May 2023 was 10 g% and baseline.   Current: In the back of all this she was admitted 03/11/2022 for 4 days and discharged 03/15/2022 with a history of hemoptysis for [redacted] week along with acute hypoxemic respiratory failure requiring 4 L nasal cannula.  An echocardiogram apparently showed restricted posterior mitral valve leaflet with moderate mitral regurgitation EF 55%.  [Several months ago she seemed to have a high elevated pulmonary pressures on echo].  According to her in the chart she was treated with oxygen therapy antibiotics steroids and diuresis.  She did improve.  She is off oxygen.  She  says she is also off steroids at this point.  She had a CT scan of the chest there was angiogram.  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  Therefore she has been referred here.  Her daughter states that they are aware of ILD changes even a few years ago [2019 high-resolution CT chest showed some groundglass opacities and that the only other CT scan of the chest].  At this point  in time she is back to baseline there is no more hemoptysis she is able to do ADLs.  She does use a wheelchair when she goes shopping out of the doctor's office but she is able to walk around the house.  In fact today she was able to walk 2 out of the 3 laps and stopped because of joint issues and fatigue.  She did not desaturate.   Lab review: Most recent creatinine 1 mg percent 03/15/2022  04/19/2022 Follow up : RA-ILD  Patient returns for a 2-week follow-up.  Patient was seen last visit for a pulmonary consult for interstitial lung disease.  Patient has underlying rheumatoid arthritis.  Says she has been on multiple treatments in the past including methotrexate, Humira, Orencia but is currently off of all therapy due to intolerance.  Patient says that she has been discharged from her rheumatologist recently and needs a new rheumatologist.  Patient was set up for high-resolution CT chest completed on April 16, 2022 that shows patchy pulmonary parenchymal groundglass, coarsening bronchiectasis and subpleural reticulation.  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  Mediastinal adenopathy in the setting of ILD and appears slightly improved from CT on Mar 09, 2022.  Lab work shows decrease sed rate at 37, positive CCP greater than 250, negative ANA, positive rheumatoid factor at 904, decreased BNP at 169.  QuantiFERON gold was -1-year ago most recent test showed indeterminate.  2D echo is pending.  Was admitted to the hospital early May for acute respiratory failure felt secondary to possible atypical pneumonia.  She was treated with empiric antibiotics and steroids.  Along with nebulized bronchodilators.  Weaned off of oxygen prior to discharge. Patient says overall she is actually feeling better since discharge.  She has had less joint swelling and pain.  Breathing has been better.  She says since coming off the  methotrexate she is felt significantly improved. She was set up for pulmonary function testing completed on April 13, 2022 that showed no airflow obstruction or restriction.  FEV1 116%, ratio 91, FVC 99%, positive bronchodilator response, DLCO decreased at 54%.Marland Kitchen  She was set up for an overnight oximetry test.  This is showed minimum desaturations with O2 saturations less than 88%.  7 minutes and 40 seconds.  *Test results.  Patient wants to hold off on starting oxygen.   T/EVENTS :  04/14/2022 ONO <88% for 7 minutes and 40 seconds  CT chest Mar 09, 2022 shows extensive fibrotic changes with associated bronchiectasis bilaterally   Echo 04/22/22 - IMPRESSIONS     1. Left ventricular ejection fraction, by estimation, is 60 to 65%. The  left ventricle has normal function. The left ventricle has no regional  wall motion abnormalities. There is moderate concentric left ventricular  hypertrophy. Left ventricular  diastolic parameters are indeterminate.   2. Right ventricular systolic function is normal. The right ventricular  size is normal.   3. Left atrial size was moderately dilated.   4. The mitral valve  is normal in structure. Mild mitral valve  regurgitation.   5. The aortic valve is normal in structure. Aortic valve regurgitation is  not visualized.   6. The inferior vena cava is normal in size with greater than 50%  respiratory variability, suggesting right atrial pressure of 3 mmHg.   OV 07/27/2022  Subjective:  Patient ID: Kathleen Kane, female , DOB: 1960-12-20 , age 21 y.o. , MRN: [id number] , ADDRESS: [place] [id number] PCP [name], MD Patient Care Team: [name], MD as PCP - General (Internal Medicine) Josue Hector, MD as PCP - Cardiology (Cardiology)  This Provider for this visit: Treatment Team:  Attending Provider: Brand Males, MD    07/27/2022 -   Chief Complaint  Patient presents with   Follow-up    SOB/ cough improved      HPI Kathleen Kane 62 y.o. -presents with daughter Museum/gallery conservator.  She is reports continued improvement in her respiratory status particularly after stopping methotrexate in the hospitalization.  She had a CT scan of the chest a few months ago and this showed NSIP pattern of ILD.  She believes that this is stable.  However she is dealing with significant joint pain and this is making her short of breath.  She thinks her joint pain is because of rheumatoid arthritis and her weight causing shortness of breath.  She not having any cough.  The cough is significantly improved.  She is frustrated that no rheumatologist will see her.  OV 10/19/2022  Subjective:  Patient ID: Kathleen Kane, female , DOB: 05-05-1961 , age 5 y.o. , MRN: [id number] , ADDRESS: [place] [address] PCP [name], MD Patient Care Team: [name], MD as PCP - General (Internal Medicine) Josue Hector, MD as PCP - Cardiology (Cardiology)  This Provider for this visit: Treatment Team:  Attending Provider: Brand Males, MD    10/19/2022 -   Chief Complaint  Patient presents with   Follow-up    PFT performed today.  Pt states her breathing is about the same since last visit and also has complaints of a cough.     HPI Kathleen Kane 62 y.o. -with rheumatoid arthritis.  Obese lady.  Presents with her daughter.  Since her last visit she continues to remain symptomatic with more shortness of breath on exertion [combination of ILD and also obesity and rheumatoid arthritis] and also severe joint pain.  She feels her respiratory status is stable.  She is not able to see her rheumatologist anymore because of refractoriness to treatment but she is in miserable pain.  We discussed the potential of starting tocilizumab.  However her QuantiFERON gold ended up being indeterminate x 2.  So we asked her primary care physician to do a PPD skin test.  She tells me that she just had this and was  negative.  She showed me her left forearm.  Was done last week.  I do not feel any induration.  Her quality of cath is limited by her pain which is quite bad and also her shortness of breath and obesity.  Her current symptom score is listed below.   OV 11/18/2022  Subjective:  Patient ID: Kathleen Kane, female , DOB: Jul 16, 1961 , age 81 y.o. 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  - started actemra Jan 2024 (PPD neg per hx Dec 2023 - summer 2023 -indeterminate QuantiFERON gold]  11/18/2022 -   Chief Complaint  Patient presents with   Follow-up    Doing well.  Discuss PFT 10/17/2023.     HPI ARAMIS ZOBEL 62 y.o. -returns for follow-up.  At this visit just to see how she is doing after Tocilizumab start) show this has been started.  Review of the external records indicate that this is being started by the pharmacist.  She has had 1 dose so far.  Her daughter is administering the doses at home.  She is going to get weekly doses.  She continues to be in severe pain.  She rates her pain at 3.5/5.  She described the pain is debilitating and something that she would not wish on anyone.  She is cautiously optimistic that the tocilizumab would help.  Her respiratory status is stable.  She had pulmonary function test the DLCO stable but the FVC shows significant decline of 17% relative decline..  However his symptom scores are stable.  And the DLCO is also stable.   Most recent labs were from November  2023.  SYMPTOM SCALE - ILD 07/27/2022 10/19/2022  11/18/2022   Current weight     O2 use ra ra ra  Shortness of Breath 0 -> 5 scale with 5 being worst (score 6 If unable to do)    At rest 1.'1 1 1  '$ Simple tasks - showers, clothes change, eating, shaving '2 3 2  '$ Household (dishes, doing bed, laundry) '2 3 2  '$ Shopping 1 2 0  Walking level at own pace '4 4 3  '$ Walking up Stairs '4 4 4  '$ Total (30-36) Dyspnea Score '14 17 12  '$ How bad is your cough? Hardly coughs anymore 2 mild  How bad is your fatigue No response 3 mod  How bad is nausea 0 0 0  How bad is vomiting?  0 0 0  How bad is diarrhea? 0 0 0  How bad is anxiety? xx 2 mild  How bad is depression xx 2 mild  Any chronic pain - if so where and how bad Unable to function because of severe knee pain and shoulder pain. 4.5 3.5        Simple office walk 185 feet x  3 laps goal with forehead probe 04/08/2022    O2 used ra   Number laps completed 2 of 3 laps   Comments about pace slow   Resting Pulse Ox/HR 100% and 80/min   Final Pulse Ox/HR 98% and 148/min   Desaturated </= 88% no   Desaturated <= 3% points no   Got Tachycardic >/= 90/min Yes, A Fib   Symptoms at end of test Fatigue, pain and dyspnea   Miscellaneous comments Was in  Aifb        PFT     Latest Ref Rng & Units 11/16/2022   11:59 AM 10/19/2022    8:52 AM 04/13/2022   12:52 PM  PFT Results  FVC-Pre L 1.81  P 1.94  P 2.18   FVC-Predicted Pre % 60  P 65  P 92   FVC-Post L  2.35   FVC-Predicted Post %   99   Pre FEV1/FVC % % 90  P 92  P 80   Post FEV1/FCV % %   91   FEV1-Pre L 1.62  P 1.79  P 1.75   FEV1-Predicted Pre % 70  P 78  P 95   FEV1-Post L   2.15   DLCO uncorrected ml/min/mmHg 10.13  P 9.79  P 10.11   DLCO UNC% % 54  P 52  P 54   DLCO corrected ml/min/mmHg 10.70  P 10.33  P 11.21   DLCO COR %Predicted % 57  P 55  P 60   DLVA Predicted % 80  P 73  P 79   TLC L   3.20   TLC % Predicted %   67   RV % Predicted %   -37     P Preliminary result        has a past medical history of Acute on chronic diastolic CHF (congestive heart failure) (HCC) (08/08/2015), Anxiety, Atrial fibrillation, persistent ([place]), Carpal tunnel syndrome, right (12/15/2016), CHF (congestive heart failure) ([place]) (2013), CHF exacerbation ([place]) (10/01/2018), Depression, Diabetes mellitus ([place]) (08/10/2012), Diabetes mellitus without complication ([place]), Hypertension, 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   reports that she quit smoking about 8 years ago. Her smoking use included cigarettes. She started smoking about 46 years ago. She has a 36.00 pack-year smoking history. She has never been exposed to tobacco smoke. She has never used smokeless tobacco.  Past Surgical History:  Procedure Laterality Date   CARDIOVASCULAR STRESS TEST  02/16/2013   no significant EKG changes with Lexiscan, normal LV function and normal wall function   CESAREAN SECTION     x2   DOPPLER ECHOCARDIOGRAPHY  01/24/2008   EF 55-60%, no diagnostic evidence of LV wall motion abnormalities, LV wall thickness was mild-moderately increased.   HAND SURGERY Right 2018   LAPAROTOMY Right 02/27/2013   Procedure: EXPLORATORY LAPAROTOMY, right salpingo-oopherectomy;  Surgeon: [name], MD;  Location: [facility];  Service: Gynecology;  Laterality: Right;   left breast cyst     SALPINGOOPHORECTOMY Right 02/27/2013   Procedure: SALPINGO OOPHORECTOMY;  Surgeon: [name], MD;  Location: [facility];  Service: Gynecology;  Laterality: Right;   TUBAL LIGATION Bilateral 1989    Allergies  Allergen Reactions   Ace Inhibitors Cough   Lisinopril Cough     There is no immunization  history on file for this patient.  Family History  Problem Relation Age of Onset   Hypertension Mother    Diabetes Mother    Throat cancer Mother    Hypertension Father    Diabetes Sister    Heart Problems Sister    Kidney failure Sister    Hypertension Brother    Diabetes Brother    Diabetes Paternal Grandmother    Autoimmune disease Daughter    Healthy Son      Current Outpatient Medications:    Accu-Chek FastClix Lancets MISC, Apply topically., Disp: , Rfl:    ACCU-CHEK GUIDE test strip, , Disp: , Rfl:    apixaban (ELIQUIS) 5 MG TABS tablet, TAKE 1 TABLET BY MOUTH TWICE A DAY, Disp: 60 tablet, Rfl: 10   atorvastatin (LIPITOR) 20 MG tablet, TAKE 1 TABLET (20 MG TOTAL) BY MOUTH  DAILY. PLEASE SCHEDULE YEARLY APPOINTMENT FOR FUTURE REFILLS. 1ST ATTEMPT. THANK YOU, Disp: 30 tablet, Rfl: 2   B-D ULTRAFINE III SHORT PEN 31G X 8 MM MISC, Inject into the skin 2 (two) times daily., Disp: , Rfl:    blood glucose meter kit and supplies, Dispense based on patient and insurance preference. Use up to four times daily as directed. (FOR ICD-10 E10.9, E11.9)., Disp: 1 each, Rfl: 0   ferrous sulfate 325 (65 FE) MG tablet, Take 1 tablet (325 mg total) by mouth daily with breakfast., Disp: 30 tablet, Rfl: 3   folic acid (FOLVITE) 1 MG tablet, Take 1 mg by mouth every morning., Disp: , Rfl: 3   furosemide (LASIX) 40 MG tablet, Take 1 tablet (40 mg total) by mouth daily., Disp: 45 tablet, Rfl: 0   levalbuterol (XOPENEX HFA) 45 MCG/ACT inhaler, Inhale 1-2 puffs into the lungs every 6 (six) hours as needed for wheezing., Disp: 1 each, Rfl: 3   losartan (COZAAR) 25 MG tablet, TAKE 1 TABLET BY MOUTH EVERY DAY, Disp: 90 tablet, Rfl: 3   metFORMIN (GLUCOPHAGE) 500 MG tablet, Take 2 tablets (1,000 mg total) by mouth 2 (two) times daily., Disp: 60 tablet, Rfl: 0   metoprolol tartrate (LOPRESSOR) 25 MG tablet, Take 1 tablet (25 mg total) by mouth 2 (two) times daily., Disp: 180 tablet, Rfl: 3   NOVOLOG MIX 70/30  FLEXPEN (70-30) 100 UNIT/ML FlexPen, Inject 0.4 mLs (40 Units total) into the skin 2 (two) times daily with a meal. (Patient taking differently: Inject 40-80 Units into the skin See admin instructions. Inject 40-80 units subcutaneously once or twice daily with meals - dose is based on CBG and carbs), Disp: 15 mL, Rfl: 0   potassium chloride (KLOR-CON M) 10 MEQ tablet, Take 1 tablet (10 mEq total) by mouth 2 (two) times daily., Disp: 180 tablet, Rfl: 3   predniSONE (DELTASONE) 10 MG tablet, Take 10 mg by mouth daily., Disp: , Rfl:    spironolactone (ALDACTONE) 25 MG tablet, Take 0.5 tablets (12.5 mg total) by mouth daily., Disp: 45 tablet, Rfl: 3   Tocilizumab (ACTEMRA ACTPEN) 162 MG/0.9ML SOAJ, Inject 162 mg into the skin every 7 (seven) days., Disp: 3.6 mL, Rfl: 1   azithromycin (ZITHROMAX) 250 MG tablet, Take 1 tablet (250 mg total) by mouth daily. Take first 2 tablets together, then 1 every day until finished. (Patient not taking: Reported on 11/18/2022), Disp: 6 tablet, Rfl: 0   benzonatate (TESSALON) 100 MG capsule, Take 1 capsule (100 mg total) by mouth every 8 (eight) hours. (Patient not taking: Reported on 11/18/2022), Disp: 15 capsule, Rfl: 0      Objective:   Vitals:   11/18/22 1317  BP: (!) 142/74  Pulse: 88  Temp: 98.6 F (37 C)  TempSrc: Oral  SpO2: 98%  Weight: 237 lb 9.6 oz (107.8 kg)  Height: 5' 2.5" (1.588 m)    Estimated body mass index is 42.77 kg/m as calculated from the following:   Height as of this encounter: 5' 2.5" (1.588 m).   Weight as of this encounter: 237 lb 9.6 oz (107.8 kg).  '@WEIGHTCHANGE'$ @  [employer]   11/18/22 1317  Weight: 237 lb 9.6 oz (107.8 kg)     Physical Exam  General: No distress.  Morbidly obese sitting on the wheelchair has a mask on. Neuro: Alert and Oriented x 3. GCS 15. Speech normal Psych: Pleasant Resp:  Barrel Chest - no.  Wheeze - no, Crackles - no, No overt  respiratory distress CVS: Normal heart sounds. Murmurs - no Ext:  Stigmata of Connective Tissue Disease - no HEENT: Normal upper airway. PEERL +. No post nasal drip        Assessment:       ICD-10-CM   1. Interstitial lung disease due to connective tissue disease ([place])  J84.89    M35.9     2. Rheumatoid arthritis, 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[place])  M05.9     3. High risk medication use  Z79.899     4. Medication monitoring encounter  Z51.81          Plan:     Patient Instructions     ICD-10-CM   1. Interstitial lung disease due to connective tissue disease ([place])  J84.89    M35.9     2. Rheumatoid arthritis, 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[place])  M05.9     3. 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  - PFT June 2023 ->Jan 2024 stable v mild worse You are also having severe joint pain from rheumatoid arthritis in the absence of biological agents Your pain is severe despite being on daily prednisone 10 mg/day.  -I am concerned that increasing of prednisone will cause more side effects  Now on treatment with Actemra since January 2024 weekly injections and tolerating it well but so far no improvement in pain after first dose   Plan -Continue biologic Actemra  -Expect pain improvement in 1-3 months - spiro/dlco in 12 weeks   Follow-up - Return in 12 weeks to see Dr Chase Caller -30 min slot but after PFT  -Symptom score at follow-up  = Timing of follow-up CT scan to be decided at follow-up  Moderate Complexity   The table below is from the 2021 E/M guidelines, first released in 2021, with minor revisions added in 2023. Must meet the requirements for 2 out of 3 dimensions to qualify.    Number and complexity of problems addressed Amount and/or complexity of data reviewed Risk of complications and/or morbidity  One or more chronic illness with mild exacerbation, progression, or side effects of treatment  Two or more stable  chronic illnesses  One undiagnosed new problem with uncertain prognosis  One acute illness with systemic symptoms   Acute complicated injury Must meet the requirements for 1 of 3 of the categories)  Category 1: Tests and documents, historian  Any combination of 3 of the following:  Assessment requiring an independent historian  Review of prior external records  Review of results of each unique test  Ordering of each unique test    Category 2: Interpretation of tests  Independent interpretation of a test perfromed by another physician/NPP  Category 3: Discuss management/tests  Discussion of magagement or tests with an external physician/NPP Prescription drug management  Decision regarding minor surgery with identfied patient or procedure risk factors  Decision regarding elective major surgery without identified patient or procedure risk factors  Diagnosis or treatment significantly limited by social determinants of health            SIGNATURE    Dr. Brand Males, M.D., F.C.C.P,  Pulmonary and Critical Care Medicine Staff Physician, [place] Director - Interstitial Lung Disease  Program  Pulmonary [facility] [place], [place], [address]  Pager: [phone], If no answer or between  15:00h - 7:00h: call [phone]  [facility] Telephone: [phone]  2:01 PM 11/18/2022

## 2022-11-19 ENCOUNTER — Telehealth: Payer: Self-pay | Admitting: Cardiovascular Disease

## 2022-11-19 DIAGNOSIS — I5032 Chronic diastolic (congestive) heart failure: Secondary | ICD-10-CM

## 2022-11-19 MED ORDER — METOLAZONE 5 MG PO TABS: 5.0000 mg | ORAL_TABLET | ORAL | 3 refills | Status: DC

## 2022-11-19 NOTE — Telephone Encounter (Signed)
Having problems with fluid build up in my both my legs.  Being having some sob.    Have appointment to see MD on 12/13/22.

## 2022-11-19 NOTE — Telephone Encounter (Signed)
Josue Hector, MD  You2 hours ago (11:37 AM)   Her EF is normal Edema from steroids, obesity and lung issues F/U with primary and pulmonary can add zaroxyln 5 mg qod to lasix and check BMET / BNP next week     Called patient with Dr. Kyla Balzarine advisement. Patient verbalized understanding. Will send in medication to patient's pharmacy and she will come in next Friday for lab work.

## 2022-11-19 NOTE — Telephone Encounter (Signed)
Called patient back about her message. Patient complaining of BLE edema in her ankles and she has been having SOB with activity. Patient stated she doubled up on her Lasix for a couple of days, which was 80 mg. Informed patient that she should not do this without a doctors order. Patient stated it did not do her any good, so she went back down to her lasix 40 mg daily. Patient wanted to see Dr. Johnsie Cancel, she has an appointment on 12/13/22. Will send message to Dr. Johnsie Cancel for further advisement.

## 2022-11-25 ENCOUNTER — Other Ambulatory Visit (HOSPITAL_COMMUNITY): Payer: Self-pay

## 2022-11-26 ENCOUNTER — Ambulatory Visit: Payer: Medicaid Other | Attending: Cardiovascular Disease

## 2022-11-26 DIAGNOSIS — I5032 Chronic diastolic (congestive) heart failure: Secondary | ICD-10-CM

## 2022-11-27 LAB — BASIC METABOLIC PANEL
BUN/Creatinine Ratio: 31 — ABNORMAL HIGH (ref 12–28)
BUN: 40 mg/dL — ABNORMAL HIGH (ref 8–27)
CO2: 24 mmol/L (ref 20–29)
Calcium: 10.1 mg/dL (ref 8.7–10.3)
Chloride: 95 mmol/L — ABNORMAL LOW (ref 96–106)
Creatinine, Ser: 1.3 mg/dL — ABNORMAL HIGH (ref 0.57–1.00)
Glucose: 255 mg/dL — ABNORMAL HIGH (ref 70–99)
Potassium: 3.2 mmol/L — ABNORMAL LOW (ref 3.5–5.2)
Sodium: 140 mmol/L (ref 134–144)
eGFR: 46 mL/min/{1.73_m2} — ABNORMAL LOW (ref >59)

## 2022-11-27 LAB — PRO B NATRIURETIC PEPTIDE: NT-Pro BNP: 293 pg/mL — ABNORMAL HIGH (ref 0–287)

## 2022-11-29 ENCOUNTER — Ambulatory Visit: Payer: Medicaid Other | Admitting: Cardiovascular Disease

## 2022-11-29 ENCOUNTER — Telehealth: Payer: Self-pay

## 2022-11-29 MED ORDER — METOLAZONE 5 MG PO TABS: 5.0000 mg | ORAL_TABLET | ORAL | 3 refills | Status: DC | PRN

## 2022-11-29 NOTE — Telephone Encounter (Signed)
The patient has been notified of the result and verbalized understanding.  All questions (if any) were answered. Ewell Poe [place], RN 11/29/2022 9:07 AM   Per Dr. Johnsie Cancel okay for patient to take metolazone as needed for edema. Will leave on patient's medication list.

## 2022-11-29 NOTE — Telephone Encounter (Signed)
-----  Message from Josue Hector, MD sent at 11/29/2022  7:54 AM EST ----- Can stop xaroxylon labs show too strong she is not in CHF by BNP

## 2022-11-30 ENCOUNTER — Other Ambulatory Visit (HOSPITAL_COMMUNITY): Payer: Self-pay

## 2022-12-01 ENCOUNTER — Other Ambulatory Visit (HOSPITAL_COMMUNITY): Payer: Self-pay

## 2022-12-01 ENCOUNTER — Other Ambulatory Visit: Payer: Self-pay

## 2022-12-03 ENCOUNTER — Other Ambulatory Visit (HOSPITAL_COMMUNITY): Payer: Self-pay

## 2022-12-03 NOTE — Progress Notes (Signed)
CARDIOLOGY OFFICE NOTE  Date:  12/13/2022    Kathleen Kane Date of Birth: 01/08/1961 Medical Record #[id number]  PCP:  [name], MD  Cardiologist:  Gillian Shields    No chief complaint on file.   History of Present Illness: Kathleen Kane is a 62 y.o. female who presents today for a follow up visit.   She has a history of persistent AF - on Eliquis, diastolic HF, OSA (does not use CPAP), HTN, DM, RA, obesity and prior tobacco abuse. Normal Myoview from 2014. Activity limited by RA. 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 DM poorly controlled A1c 8.0   Updated TTE 04/22/22 only mild MR and EF 60-65%   On chronic steroids since 2015 seen by pulmonary and has ILD associated with RA. She has failed other biologic agents with infections/intolerance Started on Actemra  CT 04/19/22 confirmed ILD Activity is limited by this and arthritis   Got azotemic with addition of zaroxyln for edema told to take only PRN in addition to her lasix 40 mg daily Most recent CT showed moderate upper lobe ILD No PE  BNP upper normal at 293 BUN 40 Cr 1.3 when zaroxyln stopped   Long discussion with her with ILD, autoimmune dx that she has not had any vaccines for Flu, COVID or RSV  Past Medical History:  Diagnosis Date   Acute on chronic diastolic CHF (congestive heart failure) ([place]) 08/08/2015   Anxiety    Atrial fibrillation, persistent (HCC)    Carpal tunnel syndrome, right 12/15/2016   CHF (congestive heart failure) ([place]) 2013   No echo found from that time, most likely diastolic   CHF exacerbation ([place]) 10/01/2018   Depression    Diabetes mellitus ([place]) 08/10/2012   Diabetes mellitus without complication ([place])    Hypertension    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 AHI-6.8/hr, during REM-27.3/hr. RDI-25.0/hr, during REM-40.9/hr. avg o2 sat during REM and NREM 95%   OSA (obstructive sleep apnea) 02/10/2017   Pelvic mass in female 01/29/2013   With ascites    Pneumonia 10/02/2018   Rheumatoid arthritis ([place])    Rheumatoid arthritis, 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[place]) 10/08/2015    Past Surgical History:  Procedure Laterality Date   CARDIOVASCULAR STRESS TEST  02/16/2013   no significant EKG changes with Lexiscan, normal LV function and normal wall function   CESAREAN SECTION     x2   DOPPLER ECHOCARDIOGRAPHY  01/24/2008   EF 55-60%, no diagnostic evidence of LV wall motion abnormalities, LV wall thickness was mild-moderately increased.   HAND SURGERY Right 2018   LAPAROTOMY Right 02/27/2013   Procedure: EXPLORATORY LAPAROTOMY, right salpingo-oopherectomy;  Surgeon: [name], MD;  Location: [facility];  Service: Gynecology;  Laterality: Right;   left breast cyst     SALPINGOOPHORECTOMY Right 02/27/2013   Procedure: SALPINGO OOPHORECTOMY;  Surgeon: [name], MD;  Location: [facility];  Service: Gynecology;  Laterality: Right;   TUBAL LIGATION Bilateral 1989     Medications: Current Meds  Medication Sig   Accu-Chek FastClix Lancets MISC Apply topically.   ACCU-CHEK GUIDE test strip    apixaban (ELIQUIS) 5 MG TABS tablet TAKE 1 TABLET  BY MOUTH TWICE A DAY   atorvastatin (LIPITOR) 20 MG tablet TAKE 1 TABLET (20 MG TOTAL) BY MOUTH DAILY. PLEASE SCHEDULE YEARLY APPOINTMENT FOR FUTURE REFILLS. 1ST ATTEMPT. THANK YOU   B-D ULTRAFINE III SHORT PEN 31G X 8 MM MISC Inject into the skin 2 (two) times daily.   blood glucose meter kit and supplies Dispense based on patient and insurance preference. Use up to four times daily as directed. (FOR ICD-10 E10.9, E11.9).   ferrous sulfate 325 (65 FE) MG tablet Take 1 tablet (325 mg total) by mouth daily with breakfast.    folic acid (FOLVITE) 1 MG tablet Take 1 mg by mouth every morning.   furosemide (LASIX) 40 MG tablet Take 1 tablet (40 mg total) by mouth daily.   levalbuterol (XOPENEX HFA) 45 MCG/ACT inhaler Inhale 1-2 puffs into the lungs every 6 (six) hours as needed for wheezing.   losartan (COZAAR) 25 MG tablet TAKE 1 TABLET BY MOUTH EVERY DAY   metFORMIN (GLUCOPHAGE) 500 MG tablet Take 2 tablets (1,000 mg total) by mouth 2 (two) times daily.   metoprolol tartrate (LOPRESSOR) 25 MG tablet Take 1 tablet (25 mg total) by mouth 2 (two) times daily.   NOVOLOG MIX 70/30 FLEXPEN (70-30) 100 UNIT/ML FlexPen Inject 0.4 mLs (40 Units total) into the skin 2 (two) times daily with a meal.   potassium chloride (KLOR-CON M) 10 MEQ tablet Take 1 tablet (10 mEq total) by mouth 2 (two) times daily.   predniSONE (DELTASONE) 10 MG tablet Take 10 mg by mouth daily.   spironolactone (ALDACTONE) 25 MG tablet Take 0.5 tablets (12.5 mg total) by mouth daily.   Tocilizumab (ACTEMRA ACTPEN) 162 MG/0.9ML SOAJ Inject 162 mg into the skin every 7 (seven) days.     Allergies: Allergies  Allergen Reactions   Ace Inhibitors Cough   Lisinopril Cough    Social History: The patient  reports that she quit smoking about 8 years ago. Her smoking use included cigarettes. She started smoking about 46 years ago. She has a 36.00 pack-year smoking history. She has never been exposed to tobacco smoke. She has never used smokeless tobacco. She reports that she does not drink alcohol and does not use drugs.   Family History: The patient's family history includes Autoimmune disease in her daughter; Diabetes in her brother, mother, paternal grandmother, and sister; Healthy in her son; Heart Problems in her sister; Hypertension in her brother, father, and mother; Kidney failure in her sister; Throat cancer in her mother.   Review of Systems: Please see the history of present illness.   All other systems are reviewed and negative.   Physical  Exam: VS:  BP 120/72   Pulse 91   Ht 5' 2.5" (1.588 m)   Wt 229 lb 12.8 oz (104.2 kg)   LMP 10/28/2013   SpO2 95%   BMI 41.36 kg/m  .  BMI Body mass index is 41.36 kg/m.  Wt Readings from Last 3 Encounters:  12/13/22 229 lb 12.8 oz (104.2 kg)  11/18/22 237 lb 9.6 oz (107.8 kg)  10/19/22 240 lb 9.6 oz (109.1 kg)   Affect appropriate Overweight black female  HEENT: normal Neck supple with no adenopathy JVP normal no bruits no thyromegaly Lungs clear with no wheezing and good diaphragmatic motion Heart:  S1/S2 apical MR  murmur, no rub, gallop or click PMI normal Abdomen: benighn, BS positve, no tenderness, no AAA no bruit.  No HSM or HJR Distal pulses intact with no bruits Trace  bilateral edema Neuro non-focal Skin warm and dry No muscular weakness    LABORATORY DATA:  EKG:  EKG is ordered today.  Personally reviewed by me. This demonstrates AF with controlled VR - HR is 87.  Lab Results  Component Value Date   WBC 6.6 12/29/2018   HGB 12.9 12/29/2018   HCT 45.7 12/29/2018   PLT 291 12/29/2018   GLUCOSE 140 (H) 12/29/2018   CHOL  01/24/2008    155        ATP III CLASSIFICATION:  <200     mg/dL   Desirable  200-239  mg/dL   Borderline High  >=240    mg/dL   High   TRIG 141 01/24/2008   HDL 30 (L) 01/24/2008   LDLCALC  01/24/2008    97        Total Cholesterol/HDL:CHD Risk Coronary Heart Disease Risk Table                     Men   Women  1/2 Average Risk   3.4   3.3   ALT 22 12/29/2018   AST 23 12/29/2018   NA 138 12/29/2018   K 3.7 12/29/2018   CL 105 12/29/2018   CREATININE 0.85 12/29/2018   BUN 20 12/29/2018   CO2 19 (L) 12/29/2018   TSH 1.162 Test methodology is 3rd generation TSH 01/23/2008   INR 1.08 01/10/2016   HGBA1C 8.5 (H) 10/02/2018     BNP (last 3 results) Recent Labs    03/09/22 2030 03/11/22 2245  BNP 407.0* 127.8*    ProBNP (last 3 results) Recent Labs    04/08/22 1250 07/27/22 1622 11/26/22 1258  PROBNP 169.0* 238.0*  293*     Other Studies Reviewed Today:  TTE 04/22/22  IMPRESSIONS     1. Left ventricular ejection fraction, by estimation, is 60 to 65%. The  left ventricle has normal function. The left ventricle has no regional  wall motion abnormalities. There is moderate concentric left ventricular  hypertrophy. Left ventricular  diastolic parameters are indeterminate.   2. Right ventricular systolic function is normal. The right ventricular  size is normal.   3. Left atrial size was moderately dilated.   4. The mitral valve is normal in structure. Mild mitral valve  regurgitation.   5. The aortic valve is normal in structure. Aortic valve regurgitation is  not visualized.   6. The inferior vena cava is normal in size with greater than 50%  respiratory variability, suggesting right atrial pressure of 3 mmHg.    Assessment/Plan:  1. 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 HLD -  LDL 71 continue lipitor   4. Chronic diastolic dysfunction - BP is ok.  Echo EF 95-63% mild LVH diastolic indeterminate due to afib on TTE 04/22/22 BNP not elevated  Continue lasix 40 mg and aldactone 12.5 mg Zaroxyln d/c due to azotemia only to be used PRN   5. Valvular heart disease - mild  MR by TTE 04/22/22   6. DM - poorly controlled A1c 8.0 f/u primary   7.  RA/ILD:  chronic steroids prednisone 10 mg CT confirmed ESR 39 needs to f/u with pulmonary /rheumatology Now on Actemra     Disposition:   FU with [place] in a year      Patient is agreeable to this plan and will call if any problems  develop in the interim.   Signed: Jenkins Rouge, MD  12/13/2022 9:35 AM  [address] [place], [place]  [address] Phone: [phone] Fax: [phone]

## 2022-12-13 ENCOUNTER — Ambulatory Visit: Payer: Medicaid Other | Attending: Cardiovascular Disease | Admitting: Cardiovascular Disease

## 2022-12-13 ENCOUNTER — Encounter: Payer: Self-pay | Admitting: Cardiovascular Disease

## 2022-12-13 ENCOUNTER — Other Ambulatory Visit (INDEPENDENT_AMBULATORY_CARE_PROVIDER_SITE_OTHER): Payer: Medicaid Other

## 2022-12-13 VITALS — BP 120/72 | HR 91 | Ht 62.5 in | Wt 229.8 lb

## 2022-12-13 DIAGNOSIS — M359 Systemic involvement of connective tissue, unspecified: Secondary | ICD-10-CM

## 2022-12-13 DIAGNOSIS — I340 Nonrheumatic mitral (valve) insufficiency: Secondary | ICD-10-CM | POA: Diagnosis not present

## 2022-12-13 DIAGNOSIS — E785 Hyperlipidemia, unspecified: Secondary | ICD-10-CM

## 2022-12-13 DIAGNOSIS — I10 Essential (primary) hypertension: Secondary | ICD-10-CM | POA: Diagnosis not present

## 2022-12-13 DIAGNOSIS — I5032 Chronic diastolic (congestive) heart failure: Secondary | ICD-10-CM

## 2022-12-13 DIAGNOSIS — Z79899 Other long term (current) drug therapy: Secondary | ICD-10-CM

## 2022-12-13 DIAGNOSIS — I4819 Other persistent atrial fibrillation: Secondary | ICD-10-CM

## 2022-12-13 DIAGNOSIS — M059 Rheumatoid arthritis with rheumatoid factor, unspecified: Secondary | ICD-10-CM

## 2022-12-13 DIAGNOSIS — Z5181 Encounter for therapeutic drug level monitoring: Secondary | ICD-10-CM | POA: Diagnosis not present

## 2022-12-13 DIAGNOSIS — R609 Edema, unspecified: Secondary | ICD-10-CM

## 2022-12-13 DIAGNOSIS — J8489 Other specified interstitial pulmonary diseases: Secondary | ICD-10-CM | POA: Diagnosis not present

## 2022-12-13 LAB — CBC WITH DIFFERENTIAL/PLATELET
Basophils Absolute: 0 10*3/uL (ref 0.0–0.1)
Basophils Relative: 0.8 % (ref 0.0–3.0)
Eosinophils Absolute: 0.2 10*3/uL (ref 0.0–0.7)
Eosinophils Relative: 4.3 % (ref 0.0–5.0)
HCT: 39.3 % (ref 36.0–46.0)
Hemoglobin: 12.7 g/dL (ref 12.0–15.0)
Lymphocytes Relative: 44.5 % (ref 12.0–46.0)
Lymphs Abs: 2.3 10*3/uL (ref 0.7–4.0)
MCHC: 32.3 g/dL (ref 30.0–36.0)
MCV: 84.6 fl (ref 78.0–100.0)
Monocytes Absolute: 0.6 10*3/uL (ref 0.1–1.0)
Monocytes Relative: 12.4 % — ABNORMAL HIGH (ref 3.0–12.0)
Neutro Abs: 2 10*3/uL (ref 1.4–7.7)
Neutrophils Relative %: 38 % — ABNORMAL LOW (ref 43.0–77.0)
Platelets: 205 10*3/uL (ref 150.0–400.0)
RBC: 4.64 Mil/uL (ref 3.87–5.11)
RDW: 19 % — ABNORMAL HIGH (ref 11.5–15.5)
WBC: 5.2 10*3/uL (ref 4.0–10.5)

## 2022-12-13 LAB — COMPREHENSIVE METABOLIC PANEL
ALT: 12 U/L (ref 0–35)
AST: 10 U/L (ref 0–37)
Albumin: 4 g/dL (ref 3.5–5.2)
Alkaline Phosphatase: 54 U/L (ref 39–117)
BUN: 30 mg/dL — ABNORMAL HIGH (ref 6–23)
CO2: 26 mEq/L (ref 19–32)
Calcium: 9.5 mg/dL (ref 8.4–10.5)
Chloride: 103 mEq/L (ref 96–112)
Creatinine, Ser: 1.08 mg/dL (ref 0.40–1.20)
GFR: 54.57 mL/min — ABNORMAL LOW (ref >60.00)
Glucose, Bld: 120 mg/dL — ABNORMAL HIGH (ref 70–99)
Potassium: 3.7 mEq/L (ref 3.5–5.1)
Sodium: 140 mEq/L (ref 135–145)
Total Bilirubin: 0.5 mg/dL (ref 0.2–1.2)
Total Protein: 6.3 g/dL (ref 6.0–8.3)

## 2022-12-13 LAB — LIPID PANEL
Cholesterol: 108 mg/dL (ref 0–200)
HDL: 36.5 mg/dL — ABNORMAL LOW (ref >39.00)
LDL Cholesterol: 51 mg/dL (ref 0–99)
NonHDL: 71.31
Total CHOL/HDL Ratio: 3
Triglycerides: 104 mg/dL (ref 0.0–149.0)
VLDL: 20.8 mg/dL (ref 0.0–40.0)

## 2022-12-13 NOTE — Patient Instructions (Signed)
Medication Instructions:  Your physician recommends that you continue on your current medications as directed. Please refer to the Current Medication list given to you today.  *If you need a refill on your cardiac medications before your next appointment, please call your pharmacy*  Lab Work: If you have labs (blood work) drawn today and your tests are completely normal, you will receive your results only by: MyChart Message (if you have MyChart) OR A paper copy in the mail If you have any lab test that is abnormal or we need to change your treatment, we will call you to review the results.  Testing/Procedures: None ordered today.  Follow-Up: At [facility], you and your health needs are our priority.  As part of our continuing mission to provide you with exceptional heart care, we have created designated Provider Care Teams.  These Care Teams include your primary Cardiologist (physician) and Advanced Practice Providers (APPs -  Physician Assistants and Nurse Practitioners) who all work together to provide you with the care you need, when you need it.  We recommend signing up for the patient portal called "MyChart".  Sign up information is provided on this After Visit Summary.  MyChart is used to connect with patients for Virtual Visits (Telemedicine).  Patients are able to view lab/test results, encounter notes, upcoming appointments, etc.  Non-urgent messages can be sent to your provider as well.   To learn more about what you can do with MyChart, go to [email or web address].    Your next appointment:   1 year(s)  Provider:   Peter Nishan, MD      

## 2022-12-30 ENCOUNTER — Other Ambulatory Visit (HOSPITAL_COMMUNITY): Payer: Self-pay

## 2023-01-04 ENCOUNTER — Other Ambulatory Visit (HOSPITAL_COMMUNITY): Payer: Self-pay

## 2023-01-04 ENCOUNTER — Other Ambulatory Visit: Payer: Self-pay

## 2023-01-05 ENCOUNTER — Ambulatory Visit (INDEPENDENT_AMBULATORY_CARE_PROVIDER_SITE_OTHER): Payer: Medicaid Other | Admitting: Podiatry

## 2023-01-05 VITALS — BP 138/74

## 2023-01-05 DIAGNOSIS — M2141 Flat foot [pes planus] (acquired), right foot: Secondary | ICD-10-CM | POA: Diagnosis not present

## 2023-01-05 DIAGNOSIS — M79675 Pain in left toe(s): Secondary | ICD-10-CM | POA: Diagnosis not present

## 2023-01-05 DIAGNOSIS — M2142 Flat foot [pes planus] (acquired), left foot: Secondary | ICD-10-CM

## 2023-01-05 DIAGNOSIS — M79674 Pain in right toe(s): Secondary | ICD-10-CM

## 2023-01-05 DIAGNOSIS — E1142 Type 2 diabetes mellitus with diabetic polyneuropathy: Secondary | ICD-10-CM

## 2023-01-05 DIAGNOSIS — B351 Tinea unguium: Secondary | ICD-10-CM | POA: Diagnosis not present

## 2023-01-05 DIAGNOSIS — E119 Type 2 diabetes mellitus without complications: Secondary | ICD-10-CM

## 2023-01-05 NOTE — Progress Notes (Unsigned)
ANNUAL DIABETIC FOOT EXAM  Subjective: Kathleen Kane presents today for annual diabetic foot examination.  No chief complaint on file.  Patient confirms h/o diabetes.  Patient relates {Numbers; 0-100:15068} year h/o diabetes.  Patient denies any h/o foot wounds.  Patient has h/o foot ulcer of {jgPodToeLocator:23637}, which healed via help of ***.  Patient has h/o amputation(s): {jgamp:23617}.  Patient endorses symptoms of foot numbness.   Patient endorses symptoms of foot tingling.  Patient endorses symptoms of burning in feet.  Patient endorses symptoms of pins/needles sensation in feet.  Patient denies any numbness, tingling, burning, or pins/needle sensation in feet.  Patient has been diagnosed with neuropathy and it is managed with {JGNEUROPATHYMEDS:27053}.  Risk factors: {jgriskfactors:24044}.  Kathleen Ebbs, MD is patient's PCP. Last visit was {Time; dates multiple:15870}***.  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 AHI-6.8/hr, during REM-27.3/hr. RDI-25.0/hr, during REM-40.9/hr. avg o2 sat during REM and NREM 95%   OSA (obstructive sleep apnea) 02/10/2017   Pelvic mass in female 01/29/2013   With ascites    Pneumonia 10/02/2018   Rheumatoid arthritis ([place])    Rheumatoid arthritis, 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[place]) 10/08/2015   Patient Active  Problem List   Diagnosis Date Noted   ILD (interstitial lung disease) (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   Dysphagia 01/26/2022   Personal history of colonic polyps 01/26/2022   Aortic atherosclerosis ([place]) 05/13/2021   Acute otalgia, right 11/26/2020   Arthralgia of right temporomandibular joint 11/26/2020   Bilateral impacted cerumen 11/26/2020   HCAP (healthcare-associated pneumonia) 10/02/2018   Chronic congestive heart failure ([place]) 10/01/2018   OSA (obstructive sleep apnea) 02/10/2017   Carpal tunnel syndrome, right 12/15/2016   Extensor tenosynovitis of wrist, right 12/15/2016   Bilateral primary osteoarthritis of knee 10/08/2015   Rheumatoid arthritis, 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[place]) 10/08/2015   Acute on chronic diastolic CHF (congestive heart failure) ([place]) 08/08/2015   Morbid obesity ([place]) 05/06/2015   Essential hypertension 04/10/2015   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   Primary osteoarthritis involving multiple joints 04/09/2015   High risk medications (not anticoagulants) long-term use 02/04/2015   Elevated uric acid in blood 01/23/2014   High risk medication use 01/23/2014   Numbness and tingling in left hand 10/01/2013   Tobacco abuse 10/01/2013   Elevated CA-125 02/06/2013   Pelvic mass in female 01/29/2013   Diabetes mellitus ([place]) 08/10/2012   Atrial fibrillation (HCC)    Chronic diastolic CHF (congestive heart failure) ([place])    Past Surgical History:  Procedure Laterality Date   CARDIOVASCULAR STRESS TEST  02/16/2013   no significant EKG changes with Lexiscan, normal LV function and normal wall function  CESAREAN SECTION     x2   DOPPLER ECHOCARDIOGRAPHY  01/24/2008   EF 55-60%, no diagnostic evidence of LV wall motion abnormalities, LV wall  thickness was mild-moderately increased.   HAND SURGERY Right 2018   LAPAROTOMY Right 02/27/2013   Procedure: EXPLORATORY LAPAROTOMY, right salpingo-oopherectomy;  Surgeon: [name], MD;  Location: [facility];  Service: Gynecology;  Laterality: Right;   left breast cyst     SALPINGOOPHORECTOMY Right 02/27/2013   Procedure: SALPINGO OOPHORECTOMY;  Surgeon: [name], MD;  Location: [facility];  Service: Gynecology;  Laterality: Right;   TUBAL LIGATION Bilateral 1989   Current Outpatient Medications on File Prior to Visit  Medication Sig Dispense Refill   Accu-Chek FastClix Lancets MISC Apply topically.     ACCU-CHEK GUIDE test strip      apixaban (ELIQUIS) 5 MG TABS tablet TAKE 1 TABLET BY MOUTH TWICE A DAY 60 tablet 10   atorvastatin (LIPITOR) 20 MG tablet TAKE 1 TABLET (20 MG TOTAL) BY MOUTH DAILY. PLEASE SCHEDULE YEARLY APPOINTMENT FOR FUTURE REFILLS. 1ST ATTEMPT. THANK YOU 30 tablet 2   B-D ULTRAFINE III SHORT PEN 31G X 8 MM MISC Inject into the skin 2 (two) times daily.     blood glucose meter kit and supplies Dispense based on patient and insurance preference. Use up to four times daily as directed. (FOR ICD-10 E10.9, E11.9). 1 each 0   ferrous sulfate 325 (65 FE) MG tablet Take 1 tablet (325 mg total) by mouth daily with breakfast. 30 tablet 3   folic acid (FOLVITE) 1 MG tablet Take 1 mg by mouth every morning.  3   furosemide (LASIX) 40 MG tablet Take 1 tablet (40 mg total) by mouth daily. 45 tablet 0   levalbuterol (XOPENEX HFA) 45 MCG/ACT inhaler Inhale 1-2 puffs into the lungs every 6 (six) hours as needed for wheezing. 1 each 3   losartan (COZAAR) 25 MG tablet TAKE 1 TABLET BY MOUTH EVERY DAY 90 tablet 3   metFORMIN (GLUCOPHAGE) 500 MG tablet Take 2 tablets (1,000 mg total) by mouth 2 (two) times daily. 60 tablet 0   metolazone (ZAROXOLYN) 5 MG tablet Take 1 tablet (5 mg total) by mouth as needed (edema). Take 30 minutes prior to taking Furosemide ( lasix ) (Patient not taking:  Reported on 12/13/2022) 45 tablet 3   metoprolol tartrate (LOPRESSOR) 25 MG tablet Take 1 tablet (25 mg total) by mouth 2 (two) times daily. 180 tablet 3   NOVOLOG MIX 70/30 FLEXPEN (70-30) 100 UNIT/ML FlexPen Inject 0.4 mLs (40 Units total) into the skin 2 (two) times daily with a meal. 15 mL 0   potassium chloride (KLOR-CON M) 10 MEQ tablet Take 1 tablet (10 mEq total) by mouth 2 (two) times daily. 180 tablet 3   predniSONE (DELTASONE) 10 MG tablet Take 10 mg by mouth daily.     spironolactone (ALDACTONE) 25 MG tablet Take 0.5 tablets (12.5 mg total) by mouth daily. 45 tablet 3   Tocilizumab (ACTEMRA ACTPEN) 162 MG/0.9ML SOAJ Inject 162 mg into the skin every 7 (seven) days. 3.6 mL 1   No current facility-administered medications on file prior to visit.    Allergies  Allergen Reactions   Ace Inhibitors Cough   Lisinopril Cough   Social History   Occupational History   Not on file  Tobacco Use   Smoking status: Former    Packs/day: 1.00    Years: 36.00    Total pack years: 36.00    Types: Cigarettes  Start date: 39    Quit date: 06/08/2014    Years since quitting: 8.5    Passive exposure: Never   Smokeless tobacco: Never  Vaping Use   Vaping Use: Former  Substance and Sexual Activity   Alcohol use: No    Alcohol/week: 0.0 standard drinks of alcohol   Drug use: No   Sexual activity: Yes    Partners: Male   Family History  Problem Relation Age of Onset   Hypertension Mother    Diabetes Mother    Throat cancer Mother    Hypertension Father    Diabetes Sister    Heart Problems Sister    Kidney failure Sister    Hypertension Brother    Diabetes Brother    Diabetes Paternal Grandmother    Autoimmune disease Daughter    Healthy Son     There is no immunization history on file for this patient.   Review of Systems: Negative except as noted in the HPI.   Objective: There were no vitals filed for this visit.  Kathleen Kane is a pleasant 62 y.o. female in NAD.  AAO X 3.  Vascular Examination: {jgvascular:23595}  Dermatological Examination: {jgderm:23598}  Neurological Examination: {jgneuro:23601::"Protective sensation intact 5/5 intact bilaterally with 10g monofilament b/l.","Vibratory sensation intact b/l.","Proprioception intact bilaterally."}  Musculoskeletal Examination: {jgmsk:23600}  Footwear Assessment: Does the patient wear appropriate shoes? {Yes,No}. Does the patient need inserts/orthotics? {Yes,No}.  Lab Results  Component Value Date   HGBA1C 7.4 (H) 03/12/2022   No results found. ADA Risk Categorization: Low Risk :  Patient has all of the following: Intact protective sensation No prior foot ulcer  No severe deformity Pedal pulses present  High Risk  Patient has one or more of the following: Loss of protective sensation Absent pedal pulses Severe Foot deformity History of foot ulcer  Assessment: 1. Pain due to onychomycosis of toenails of both feet   2. Pes planus of both feet   3. Diabetic peripheral neuropathy associated with type 2 diabetes mellitus ([place])   4. Encounter for diabetic foot exam ([place])     Plan: No orders of the defined types were placed in this encounter.   No orders of the defined types were placed in this encounter.   None  -Patient's family member present. All questions/concerns addressed on today's visit. -Diabetic foot examination performed today. -Stressed the importance of good glycemic control and the detriment of not  controlling glucose levels in relation to the foot. -Continue foot and shoe inspections daily. Monitor blood glucose per PCP/Endocrinologist's recommendations. -Patient to continue soft, supportive shoe gear daily. -Toenails were debrided in length and girth right third digit with sterile nail nippers and dremel without iatrogenic bleeding.  -Patient/POA to call should there be question/concern in the interim. Return in about 3 months (around  04/05/2023).  Marzetta Board, DPM

## 2023-01-06 ENCOUNTER — Encounter: Payer: Self-pay | Admitting: Podiatry

## 2023-01-06 ENCOUNTER — Other Ambulatory Visit (HOSPITAL_COMMUNITY): Payer: Self-pay

## 2023-02-04 ENCOUNTER — Other Ambulatory Visit (HOSPITAL_COMMUNITY): Payer: Self-pay

## 2023-02-07 ENCOUNTER — Telehealth: Payer: Self-pay

## 2023-02-07 ENCOUNTER — Other Ambulatory Visit: Payer: Self-pay

## 2023-02-07 ENCOUNTER — Other Ambulatory Visit (HOSPITAL_COMMUNITY): Payer: Self-pay

## 2023-02-07 ENCOUNTER — Other Ambulatory Visit: Payer: Self-pay | Admitting: Internal Medicine

## 2023-02-07 DIAGNOSIS — M059 Rheumatoid arthritis with rheumatoid factor, unspecified: Secondary | ICD-10-CM

## 2023-02-07 DIAGNOSIS — J8489 Other specified interstitial pulmonary diseases: Secondary | ICD-10-CM

## 2023-02-07 DIAGNOSIS — M359 Systemic involvement of connective tissue, unspecified: Secondary | ICD-10-CM

## 2023-02-07 DIAGNOSIS — Z79899 Other long term (current) drug therapy: Secondary | ICD-10-CM

## 2023-02-07 MED ORDER — ACTEMRA ACTPEN 162 MG/0.9ML ~~LOC~~ SOAJ
162.0000 mg | SUBCUTANEOUS | 1 refills | Status: DC
  Filled 2023-02-07: qty 3.6, 28d supply, fill #0
  Filled 2023-03-02: qty 3.6, 28d supply, fill #1

## 2023-02-07 NOTE — Telephone Encounter (Signed)
Spoke with patient she advises she will take last injection of Actemra tomorrow and is wondering will she be able to have a refill before she sees Dr. Chase Caller on the 23rd of this month. Pharmacy team can you please assist

## 2023-02-07 NOTE — Telephone Encounter (Signed)
Labs from 12/13/2022 stable to continue Actemra 162mg  subcut every 7 days. Rx sent to [facility] today.  Labs are due at f/u OV: CBC/CMP every 3 months  She reports really no improvement in RA symptoms (joints). PFTs and OV scheduled for 02/28/2023  Knox Saliva, PharmD, MPH, BCPS, CPP Clinical Pharmacist (Rheumatology and Pulmonology)

## 2023-02-08 ENCOUNTER — Other Ambulatory Visit: Payer: Self-pay

## 2023-02-09 ENCOUNTER — Other Ambulatory Visit (HOSPITAL_COMMUNITY): Payer: Self-pay

## 2023-02-25 ENCOUNTER — Other Ambulatory Visit: Payer: Self-pay

## 2023-02-25 DIAGNOSIS — M359 Systemic involvement of connective tissue, unspecified: Secondary | ICD-10-CM

## 2023-02-25 DIAGNOSIS — J849 Interstitial pulmonary disease, unspecified: Secondary | ICD-10-CM

## 2023-02-25 DIAGNOSIS — R0609 Other forms of dyspnea: Secondary | ICD-10-CM

## 2023-02-25 DIAGNOSIS — J8489 Other specified interstitial pulmonary diseases: Secondary | ICD-10-CM

## 2023-02-28 ENCOUNTER — Ambulatory Visit (INDEPENDENT_AMBULATORY_CARE_PROVIDER_SITE_OTHER): Payer: Medicaid Other | Admitting: Internal Medicine

## 2023-02-28 DIAGNOSIS — J849 Interstitial pulmonary disease, unspecified: Secondary | ICD-10-CM

## 2023-02-28 DIAGNOSIS — R0609 Other forms of dyspnea: Secondary | ICD-10-CM

## 2023-02-28 LAB — PULMONARY FUNCTION TEST
DL/VA % pred: 99 %
DL/VA: 4.22 ml/min/mmHg/L
DLCO cor % pred: 44 %
DLCO cor: 8.48 ml/min/mmHg
DLCO unc % pred: 44 %
DLCO unc: 8.48 ml/min/mmHg
FEF 25-75 Pre: 3.43 L/sec
FEF2575-%Pred-Pre: 159 %
FEV1-%Pred-Pre: 75 %
FEV1-Pre: 1.77 L
FEV1FVC-%Pred-Pre: 120 %
FEV6-%Pred-Pre: 64 %
FEV6-Pre: 1.9 L
FEV6FVC-%Pred-Pre: 103 %
FVC-%Pred-Pre: 61 %
FVC-Pre: 1.9 L
Pre FEV1/FVC ratio: 93 %
Pre FEV6/FVC Ratio: 100 %

## 2023-02-28 NOTE — Patient Instructions (Signed)
Spiro/DLCO performed today.  

## 2023-02-28 NOTE — Progress Notes (Signed)
Spiro/DLCO performed today.  

## 2023-03-01 ENCOUNTER — Encounter: Payer: Self-pay | Admitting: Internal Medicine

## 2023-03-01 ENCOUNTER — Ambulatory Visit (INDEPENDENT_AMBULATORY_CARE_PROVIDER_SITE_OTHER): Payer: Medicaid Other | Admitting: Internal Medicine

## 2023-03-01 VITALS — BP 140/90 | HR 81 | Ht 62.5 in | Wt 239.0 lb

## 2023-03-01 DIAGNOSIS — M059 Rheumatoid arthritis with rheumatoid factor, unspecified: Secondary | ICD-10-CM | POA: Diagnosis not present

## 2023-03-01 DIAGNOSIS — J8489 Other specified interstitial pulmonary diseases: Secondary | ICD-10-CM | POA: Diagnosis not present

## 2023-03-01 DIAGNOSIS — Z79899 Other long term (current) drug therapy: Secondary | ICD-10-CM | POA: Diagnosis not present

## 2023-03-01 DIAGNOSIS — R0609 Other forms of dyspnea: Secondary | ICD-10-CM | POA: Diagnosis not present

## 2023-03-01 DIAGNOSIS — M359 Systemic involvement of connective tissue, unspecified: Secondary | ICD-10-CM

## 2023-03-01 LAB — CBC WITH DIFFERENTIAL/PLATELET
Basophils Absolute: 0 10*3/uL (ref 0.0–0.1)
Basophils Relative: 0.8 % (ref 0.0–3.0)
Eosinophils Absolute: 0.2 10*3/uL (ref 0.0–0.7)
Eosinophils Relative: 4.8 % (ref 0.0–5.0)
HCT: 40.6 % (ref 36.0–46.0)
Hemoglobin: 13.7 g/dL (ref 12.0–15.0)
Lymphocytes Relative: 41.5 % (ref 12.0–46.0)
Lymphs Abs: 1.7 10*3/uL (ref 0.7–4.0)
MCHC: 33.7 g/dL (ref 30.0–36.0)
MCV: 90 fl (ref 78.0–100.0)
Monocytes Absolute: 0.4 10*3/uL (ref 0.1–1.0)
Monocytes Relative: 9 % (ref 3.0–12.0)
Neutro Abs: 1.8 10*3/uL (ref 1.4–7.7)
Neutrophils Relative %: 43.9 % (ref 43.0–77.0)
Platelets: 209 10*3/uL (ref 150.0–400.0)
RBC: 4.51 Mil/uL (ref 3.87–5.11)
RDW: 16.1 % — ABNORMAL HIGH (ref 11.5–15.5)
WBC: 4.2 10*3/uL (ref 4.0–10.5)

## 2023-03-01 LAB — HEPATIC FUNCTION PANEL
ALT: 11 U/L (ref 0–35)
AST: 12 U/L (ref 0–37)
Albumin: 4.2 g/dL (ref 3.5–5.2)
Alkaline Phosphatase: 41 U/L (ref 39–117)
Bilirubin, Direct: 0.2 mg/dL (ref 0.0–0.3)
Total Bilirubin: 0.9 mg/dL (ref 0.2–1.2)
Total Protein: 6.5 g/dL (ref 6.0–8.3)

## 2023-03-01 LAB — PHOSPHORUS: Phosphorus: 4.3 mg/dL (ref 2.3–4.6)

## 2023-03-01 LAB — BASIC METABOLIC PANEL
BUN: 18 mg/dL (ref 6–23)
CO2: 28 mEq/L (ref 19–32)
Calcium: 9.8 mg/dL (ref 8.4–10.5)
Chloride: 101 mEq/L (ref 96–112)
Creatinine, Ser: 1.03 mg/dL (ref 0.40–1.20)
GFR: 57.67 mL/min — ABNORMAL LOW (ref >60.00)
Glucose, Bld: 163 mg/dL — ABNORMAL HIGH (ref 70–99)
Potassium: 3.3 mEq/L — ABNORMAL LOW (ref 3.5–5.1)
Sodium: 141 mEq/L (ref 135–145)

## 2023-03-01 LAB — MAGNESIUM: Magnesium: 1.7 mg/dL (ref 1.5–2.5)

## 2023-03-01 LAB — BRAIN NATRIURETIC PEPTIDE: Pro B Natriuretic peptide (BNP): 211 pg/mL — ABNORMAL HIGH (ref 0.0–100.0)

## 2023-03-01 NOTE — Progress Notes (Signed)
OV 04/08/2022 -rheumatoid arthritis immunosuppressed.  Has interstitial lung disease findings.  Subjective:  Patient ID: Kathleen Kane, female , DOB: [birth year] , age 62 y.o. , MRN: [id number] , ADDRESS: [place] [address] PCP [name], MD Patient Care Team: [name], MD as PCP - General (Internal Medicine) Josue Hector, MD as PCP - Cardiology (Cardiology)  This Provider for this visit: Treatment Team:  Attending Provider: Brand Males, MD    04/08/2022 -   Chief Complaint  Patient presents with   Consult    Pt recently had some imaging performed which is the reason for today's visit. States she was also recently in the hospital. Pt does have current complaints of cough which is worse at night.     HPI Kathleen Kane 62 y.o. -history provided by review of the chart talking to her and the daughter.  Obese lady presenting with her daughter.  She has a long history of rheumatoid arthritis diagnosed 8 years ago according to history.  She used to be a patient of Dr. Estanislado Pandy but after having failed different immunosuppression agents [efficacy issues versus side effect such as infections] she has been discharged from follow-up at the rheumatology clinic.  Recommendation was to do prednisone.  Last face-to-face visit was in December 2022 at rheumatolog wth Dr D and with the PA Hazel Sams in March 2023.  Rheumatoid arthritis history: She has RA and osteoarthritis.  Diagnosis seropositive although I could not locate seropositivity in the chart.  Charts indicate that previously she been in a great response to Plaquenil, Humira and Remicade.  Deemed poor candidate for TNF alpha inhibitors due to history of congestive heart failure.  Most recently she was on Orencia and Rasuvo but it appears that she stopped this in October 28, 2011 for "recurrent bouts of pneumonia as well as wound infection of the length of the inguinal region".  In March 2023  she expressed concern about restarting these agents because of concern for side effects.  She expressed interest in doing long-term prednisone to rheumatology in March 2023.  Patient was advised against using long-term prednisone.  She then wanted to see a different rheumatologist.  Referral was made but according to the patient nobody will see her because she has Medicaid.  She has been on chronic steroids according to the chart review since 2015.  But she tells me that she only takes it intermittently although chart says she is wanting to take this and takes it continuously.  She also has osteoarthritis of the knees.  She has idiopathic gout.  She has rheumatoid nodules  Other pertinent past medical history include: OSA unclear if she is taking CPAP, chronic atrial fibrillation sees Dr. Nolon Lennert on Eliquis and metoprolol, chronic diastolic heart failure, type 2 diabetes, vitamin D deficiency.  She has chronic anemia.  Hemoglobin in May 2023 was 10 g% and baseline.   Current: In the back of all this she was admitted 03/11/2022 for 4 days and discharged 03/15/2022 with a history of hemoptysis for [redacted] week along with acute hypoxemic respiratory failure requiring 4 L nasal cannula.  An echocardiogram apparently showed restricted posterior mitral valve leaflet with moderate mitral regurgitation EF 55%.  [Several months ago she seemed to have a high elevated pulmonary pressures on echo].  According to her in the chart she was treated with oxygen therapy antibiotics steroids and diuresis.  She did improve.  She is off oxygen.  She  says she is also off steroids at this point.  She had a CT scan of the chest there was angiogram.  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  Therefore she has been referred here.  Her daughter states that they are aware of ILD changes even a few years ago [2019 high-resolution CT chest showed some groundglass opacities and that the only other CT scan of the chest].  At this point  in time she is back to baseline there is no more hemoptysis she is able to do ADLs.  She does use a wheelchair when she goes shopping out of the doctor's office but she is able to walk around the house.  In fact today she was able to walk 2 out of the 3 laps and stopped because of joint issues and fatigue.  She did not desaturate.   Lab review: Most recent creatinine 1 mg percent 03/15/2022  04/19/2022 Follow up : RA-ILD  Patient returns for a 2-week follow-up.  Patient was seen last visit for a pulmonary consult for interstitial lung disease.  Patient has underlying rheumatoid arthritis.  Says she has been on multiple treatments in the past including methotrexate, Humira, Orencia but is currently off of all therapy due to intolerance.  Patient says that she has been discharged from her rheumatologist recently and needs a new rheumatologist.  Patient was set up for high-resolution CT chest completed on April 16, 2022 that shows patchy pulmonary parenchymal groundglass, coarsening bronchiectasis and subpleural reticulation.  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  Mediastinal adenopathy in the setting of ILD and appears slightly improved from CT on Mar 09, 2022.  Lab work shows decrease sed rate at 37, positive CCP greater than 250, negative ANA, positive rheumatoid factor at 904, decreased BNP at 169.  QuantiFERON gold was -1-year ago most recent test showed indeterminate.  2D echo is pending.  Was admitted to the hospital early May for acute respiratory failure felt secondary to possible atypical pneumonia.  She was treated with empiric antibiotics and steroids.  Along with nebulized bronchodilators.  Weaned off of oxygen prior to discharge. Patient says overall she is actually feeling better since discharge.  She has had less joint swelling and pain.  Breathing has been better.  She says since coming off the  methotrexate she is felt significantly improved. She was set up for pulmonary function testing completed on April 13, 2022 that showed no airflow obstruction or restriction.  FEV1 116%, ratio 91, FVC 99%, positive bronchodilator response, DLCO decreased at 54%.Marland Kitchen  She was set up for an overnight oximetry test.  This is showed minimum desaturations with O2 saturations less than 88%.  7 minutes and 40 seconds.  *Test results.  Patient wants to hold off on starting oxygen.   T/EVENTS :  04/14/2022 ONO <88% for 7 minutes and 40 seconds  CT chest Mar 09, 2022 shows extensive fibrotic changes with associated bronchiectasis bilaterally   Echo 04/22/22 - IMPRESSIONS     1. Left ventricular ejection fraction, by estimation, is 60 to 65%. The  left ventricle has normal function. The left ventricle has no regional  wall motion abnormalities. There is moderate concentric left ventricular  hypertrophy. Left ventricular  diastolic parameters are indeterminate.   2. Right ventricular systolic function is normal. The right ventricular  size is normal.   3. Left atrial size was moderately dilated.   4. The mitral valve  is normal in structure. Mild mitral valve  regurgitation.   5. The aortic valve is normal in structure. Aortic valve regurgitation is  not visualized.   6. The inferior vena cava is normal in size with greater than 50%  respiratory variability, suggesting right atrial pressure of 3 mmHg.   OV 07/27/2022  Subjective:  Patient ID: Kathleen Kane, female , DOB: 1960-12-20 , age 21 y.o. , MRN: [id number] , ADDRESS: [place] [address] PCP [name], MD Patient Care Team: [name], MD as PCP - General (Internal Medicine) Josue Hector, MD as PCP - Cardiology (Cardiology)  This Provider for this visit: Treatment Team:  Attending Provider: Brand Males, MD    07/27/2022 -   Chief Complaint  Patient presents with   Follow-up    SOB/ cough improved      HPI Kathleen Kane 62 y.o. -presents with daughter Museum/gallery conservator.  She is reports continued improvement in her respiratory status particularly after stopping methotrexate in the hospitalization.  She had a CT scan of the chest a few months ago and this showed NSIP pattern of ILD.  She believes that this is stable.  However she is dealing with significant joint pain and this is making her short of breath.  She thinks her joint pain is because of rheumatoid arthritis and her weight causing shortness of breath.  She not having any cough.  The cough is significantly improved.  She is frustrated that no rheumatologist will see her.  OV 10/19/2022  Subjective:  Patient ID: Kathleen Kane, female , DOB: 05-05-1961 , age 5 y.o. , MRN: [id number] , ADDRESS: [place] [address] PCP [name], MD Patient Care Team: [name], MD as PCP - General (Internal Medicine) Josue Hector, MD as PCP - Cardiology (Cardiology)  This Provider for this visit: Treatment Team:  Attending Provider: Brand Males, MD    10/19/2022 -   Chief Complaint  Patient presents with   Follow-up    PFT performed today.  Pt states her breathing is about the same since last visit and also has complaints of a cough.     HPI STAMATIA MASRI 62 y.o. -with rheumatoid arthritis.  Obese lady.  Presents with her daughter.  Since her last visit she continues to remain symptomatic with more shortness of breath on exertion [combination of ILD and also obesity and rheumatoid arthritis] and also severe joint pain.  She feels her respiratory status is stable.  She is not able to see her rheumatologist anymore because of refractoriness to treatment but she is in miserable pain.  We discussed the potential of starting tocilizumab.  However her QuantiFERON gold ended up being indeterminate x 2.  So we asked her primary care physician to do a PPD skin test.  She tells me that she just had this and was  negative.  She showed me her left forearm.  Was done last week.  I do not feel any induration.  Her quality of cath is limited by her pain which is quite bad and also her shortness of breath and obesity.  Her current symptom score is listed below.   OV 11/18/2022  Subjective:  Patient ID: Kathleen Kane, female , DOB: Jul 16, 1961 , age 81 y.o. , MRN: [id number] , ADDRESS: [place] [phone] PCP [name], MD Patient Care Team: [name], MD as PCP - General (Internal Medicine) Josue Hector, MD as  PCP - Cardiology (Cardiology)  This Provider for this visit: Treatment Team:  Attending Provider: Kalman Shan, MD   11/18/2022 -   Chief Complaint  Patient presents with   Follow-up     Doing well.  Discuss PFT 10/17/2023.     HPI DUHA ABAIR 62 y.o. -returns for follow-up.  At this visit just to see how she is doing after Tocilizumab start) show this has been started.  Review of the external records indicate that this is being started by the pharmacist.  She has had 1 dose so far.  Her daughter is administering the doses at home.  She is going to get weekly doses.  She continues to be in severe pain.  She rates her pain at 3.5/5.  She described the pain is debilitating and something that she would not wish on anyone.  She is cautiously optimistic that the tocilizumab would help.  Her respiratory status is stable.  She had pulmonary function test the DLCO stable but the FVC shows significant decline of 17% relative decline..  However his symptom scores are stable.  And the DLCO is also stable.   Most recent labs were from November 2023.    OV 03/01/2023  Subjective:  Patient ID: [name], female , DOB: 1961-09-27 , age 53 y.o. 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  - started actemra Jan 2024 (PPD neg per hx Dec 2023 - summer 2023 -indeterminate QuantiFERON gold]  Last Echo June 2023 Last HRCT June 2023   03/01/2023 -   Chief Complaint  Patient presents with   Follow-up    F/up from PFT done yesterday.     HPI YAZMINE SOREY 62 y.o. -returns for follow-up.  Presents with her daughter.  Daughter is an independent historian and filled out the symptom score sheet.  Daughter tells me that daughter herself has Sjogren's syndrome and is short of breath.  Daughter also has obesity.  I have asked him to make an appointment with me.  Regarding the patient she feels improved.  She feels more mobile.  She feels the pain is improved.  This because of the Actemra that I started in January 2024.  She continues on prednisone 10 mg/day.  I asked her to reduce it but she did not want to do it.  For she had pulmonary function test today the FVC is slightly better while the DLCO is down.  She is feeling stable.  We overall deemed her today is stable but indicated that she needs to have a CT scan and pulmonary function test in 3 months to see if she needs to go on additional medications especially approved antifibrotic nintedanib.  In addition I spoke to her about the ILD-Pro registry.  She is interested if she qualifies.    SYMPTOM SCALE - ILD 07/27/2022 Chronic pred 10mg  10/19/2022 Chronic pred 10mg  11/18/2022 Start actemira 03/01/2023 actemra  Current weight      O2 use ra ra ra ra  Shortness of Breath 0 -> 5 scale with 5 being  worst (score 6 If unable to do)     At rest 1.1 1 1 1   Simple tasks - showers, clothes change, eating, shaving  2 3 2 2   Household (dishes, doing bed, laundry) Shopping 1 2 0 2  Walking level at own pace Walking up Stairs Total (30-36) Dyspnea Score How bad is your cough? Hardly coughs anymore 2 mild 1  How bad is your fatigue No response 3 mod 2  How bad is nausea 0 0 0 0  How bad is vomiting?  0 0 0 0  How bad is diarrhea? 0 0 0 0  How bad is anxiety? xx 2 mild 0  How bad is depression xx 2 mild 0  Any chronic pain - if so where and how bad Unable to function because of severe knee pain and shoulder pain. 4.5 3.5 3        Simple office walk 185 feet x  3 laps goal with forehead probe 04/08/2022    O2 used ra   Number laps completed 2 of 3 laps   Comments about pace slow   Resting Pulse Ox/HR 100% and 80/min   Final Pulse Ox/HR 98% and 148/min   Desaturated </= 88% no   Desaturated <= 3% points no   Got Tachycardic >/= 90/min Yes, A Fib   Symptoms at end of test Fatigue, pain and dyspnea   Miscellaneous comments Was in  Aifb       PFT     Latest Ref Rng & Units 02/25/2023    9:11 AM 11/16/2022   11:59 AM 10/19/2022    8:52 AM 04/13/2022   12:52 PM  PFT Results  FVC-Pre L 1.90  1.81  1.94  P 2.18   FVC-Predicted Pre % 61  60  65  P 92   FVC-Post L    2.35   FVC-Predicted Post %    99   Pre FEV1/FVC % % 93  90  92  P 80   Post FEV1/FCV % %    91   FEV1-Pre L 1.77  1.62  1.79  P 1.75   FEV1-Predicted Pre % 75  70  78  P 95   FEV1-Post L    2.15   DLCO uncorrected ml/min/mmHg 8.48  10.13  9.79  P 10.11   DLCO UNC% % 44  54  52  P 54   DLCO corrected ml/min/mmHg 8.48  10.70  10.33  P 11.21   DLCO COR %Predicted % 44  57  55  P 60   DLVA Predicted % 99  80  73  P 79   TLC L    3.20   TLC % Predicted %    67   RV % Predicted %    -37     P Preliminary result       has a past medical history of Acute on chronic diastolic CHF (congestive heart failure) (08/08/2015), Anxiety, Atrial fibrillation, persistent, Carpal tunnel syndrome,  right (12/15/2016), CHF (congestive heart failure) (2013), CHF exacerbation (10/01/2018), Depression, Diabetes mellitus (08/10/2012), Diabetes mellitus without complication, Hypertension, 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   reports that she quit smoking  about 8 years ago. Her smoking use included cigarettes. She started smoking about 46 years ago. She has a 36.00 pack-year smoking history. She has never been exposed to tobacco smoke. She has never used smokeless tobacco.  Past Surgical History:  Procedure Laterality Date   CARDIOVASCULAR STRESS TEST  02/16/2013   no significant EKG changes with Lexiscan, normal LV function and normal wall function   CESAREAN SECTION     x2   DOPPLER ECHOCARDIOGRAPHY  01/24/2008   EF 55-60%, no diagnostic evidence of LV wall motion abnormalities, LV wall thickness was mild-moderately increased.   HAND SURGERY Right 2018   LAPAROTOMY Right 02/27/2013   Procedure: EXPLORATORY LAPAROTOMY, right salpingo-oopherectomy;  Surgeon: Laurette Schimke, MD;  Location: [facility];  Service: Gynecology;  Laterality: Right;   left breast cyst     SALPINGOOPHORECTOMY Right 02/27/2013   Procedure: SALPINGO OOPHORECTOMY;  Surgeon: Laurette Schimke, MD;  Location: [facility];  Service: Gynecology;  Laterality: Right;   TUBAL LIGATION Bilateral 1989    Allergies  Allergen Reactions   Ace Inhibitors Cough   Lisinopril Cough     There is no immunization history on file for this patient.  Family History  Problem Relation Age of Onset   Hypertension Mother    Diabetes Mother    Throat cancer Mother    Hypertension Father    Diabetes Sister    Heart Problems Sister    Kidney  failure Sister    Hypertension Brother    Diabetes Brother    Diabetes Paternal Grandmother    Autoimmune disease Daughter    Healthy Son      Current Outpatient Medications:    Accu-Chek FastClix Lancets MISC, Apply topically., Disp: , Rfl:    ACCU-CHEK GUIDE test strip, , Disp: , Rfl:    apixaban (ELIQUIS) 5 MG TABS tablet, TAKE 1 TABLET BY MOUTH TWICE A DAY, Disp: 60 tablet, Rfl: 10   atorvastatin (LIPITOR) 20 MG tablet, TAKE 1 TABLET (20 MG TOTAL) BY MOUTH DAILY. PLEASE SCHEDULE YEARLY APPOINTMENT FOR FUTURE REFILLS. 1ST ATTEMPT. THANK YOU, Disp: 30 tablet, Rfl: 2   B-D ULTRAFINE III SHORT PEN 31G X 8 MM MISC, Inject into the skin 2 (two) times daily., Disp: , Rfl:    blood glucose meter kit and supplies, Dispense based on patient and insurance preference. Use up to four times daily as directed. (FOR ICD-10 E10.9, E11.9)., Disp: 1 each, Rfl: 0   ferrous sulfate 325 (65 FE) MG tablet, Take 1 tablet (325 mg total) by mouth daily with breakfast., Disp: 30 tablet, Rfl: 3   folic acid (FOLVITE) 1 MG tablet, Take 1 mg by mouth every morning., Disp: , Rfl: 3   furosemide (LASIX) 40 MG tablet, Take 1 tablet (40 mg total) by mouth daily., Disp: 45 tablet, Rfl: 0   levalbuterol (XOPENEX HFA) 45 MCG/ACT inhaler, Inhale 1-2 puffs into the lungs every 6 (six) hours as needed for wheezing., Disp: 1 each, Rfl: 3   losartan (COZAAR) 25 MG tablet, TAKE 1 TABLET BY MOUTH EVERY DAY, Disp: 90 tablet, Rfl: 3   metFORMIN (GLUCOPHAGE) 500 MG tablet, Take 2 tablets (1,000 mg total) by mouth 2 (two) times daily., Disp: 60 tablet, Rfl: 0   metoprolol tartrate (LOPRESSOR) 25 MG tablet, Take 1 tablet (25 mg total) by mouth 2 (two) times daily., Disp: 180 tablet, Rfl: 3   NOVOLOG MIX 70/30 FLEXPEN (70-30) 100 UNIT/ML FlexPen, Inject 0.4 mLs (40 Units total) into the skin 2 (two) times daily with  a meal., Disp: 15 mL, Rfl: 0   potassium chloride (KLOR-CON M) 10 MEQ tablet, Take 1 tablet (10 mEq total) by mouth 2 (two)  times daily., Disp: 180 tablet, Rfl: 3   predniSONE (DELTASONE) 10 MG tablet, Take 10 mg by mouth daily., Disp: , Rfl:    spironolactone (ALDACTONE) 25 MG tablet, Take 0.5 tablets (12.5 mg total) by mouth daily., Disp: 45 tablet, Rfl: 3   Tocilizumab (ACTEMRA ACTPEN) 162 MG/0.9ML SOAJ, Inject 162 mg into the skin every 7 (seven) days., Disp: 3.6 mL, Rfl: 1   metolazone (ZAROXOLYN) 5 MG tablet, Take 1 tablet (5 mg total) by mouth as needed (edema). Take 30 minutes prior to taking Furosemide ( lasix ) (Patient not taking: Reported on 12/13/2022), Disp: 45 tablet, Rfl: 3      Objective:   Vitals:   03/01/23 0934  BP: (!) 140/90  Pulse: 81  SpO2: 97%  Weight: 239 lb (108.4 kg)  Height: 5' 2.5" (1.588 m)    Estimated body mass index is 43.02 kg/m as calculated from the following:   Height as of this encounter: 5' 2.5" (1.588 m).   Weight as of this encounter: 239 lb (108.4 kg).  @  [employer]   03/01/23 0934  Weight: 239 lb (108.4 kg)     Physical Exam General: No distress. Obese. On wheel chair but can wall Neuro: Alert and Oriented x 3. GCS 15. Speech normal Psych: Pleasant Resp:  Barrel Chest - no.  Wheeze - no, Crackles - mild at bse, No overt respiratory distress CVS: Normal heart sounds. Murmurs - no Ext: Stigmata of Connective Tissue Disease - no HEENT: Normal upper airway. PEERL +. No post nasal drip        Assessment:       ICD-10-CM   1. Interstitial lung disease due to connective tissue disease  J84.89    M35.9     2. Rheumatoid arthritis, seropositive  M05.9     3. High risk medication use  Z79.899          Plan:     Patient Instructions     ICD-10-CM   1. Interstitial lung disease due to connective tissue disease  J84.89    M35.9     2. Rheumatoid arthritis, seropositive  M05.9     3. High risk medication use  Z79.899         Clinically you have interstitial lung disease associated with rheumatoid arthritis.  - PFT June  2023 ->Jan 2024 stable v mild worse RA pain improved with Actemra since jan 2024 and  daily prednisone 10 mg/day.   Plan -Continue biologic Actemra  given benefit -= check cbc, bmet, LFT, BNP, mag and phos  03/01/2023 for safety monitoriung - check blood G6PD 03/01/2023  - based on results can consider adding antibiotic bactrim - HRCT in 3 months - spiro/dlco in 12 weeks    Follow-up - Return in 12 weeks to see Dr Marchelle Gearing -30 min slot but after PFT and CT  -Symptom score and walk test at follow-up   = Timing of follow-up CT scan to be decided at follow-up    SIGNATURE    Dr. Kalman Shan, M.D., F.C.C.P,  Pulmonary and Critical Care Medicine Staff Physician, [facility] Director - Interstitial Lung Disease  Program  Pulmonary Fibrosis [facility] [place], [place], [address]  Pager: [phone], If no answer or between  15:00h - 7:00h: call [phone]  [id number] Telephone: [phone]  9:50 AM 03/01/2023

## 2023-03-01 NOTE — Patient Instructions (Addendum)
ICD-10-CM   1. Interstitial lung disease due to connective tissue disease  J84.89    M35.9     2. Rheumatoid arthritis, seropositive  M05.9     3. High risk medication use  Z79.899         Clinically you have interstitial lung disease associated with rheumatoid arthritis.  - PFT June 2023 ->Jan 2024 stable v mild worse RA pain improved with Actemra since jan 2024 and  daily prednisone 10 mg/day.   Plan -Continue biologic Actemra  given benefit -= check cbc, bmet, LFT, BNP, mag and phos  03/01/2023 for safety monitoriung - check blood G6PD 03/01/2023  - based on results can consider adding antibiotic bactrim - HRCT in 3 months - spiro/dlco in 12 weeks    Follow-up - Return in 12 weeks to see Dr Marchelle Gearing -30 min slot but after PFT and CT  -Symptom score and walk test at follow-up

## 2023-03-02 ENCOUNTER — Other Ambulatory Visit (HOSPITAL_COMMUNITY): Payer: Self-pay

## 2023-03-02 ENCOUNTER — Other Ambulatory Visit: Payer: Self-pay

## 2023-03-08 LAB — GLUCOSE 6 PHOSPHATE DEHYDROGENASE: G-6PDH: 15.3 U/g Hgb (ref 7.0–20.5)

## 2023-03-19 ENCOUNTER — Encounter (HOSPITAL_COMMUNITY): Payer: Self-pay

## 2023-03-19 ENCOUNTER — Ambulatory Visit (HOSPITAL_COMMUNITY)
Admission: EM | Admit: 2023-03-19 | Discharge: 2023-03-19 | Disposition: A | Discharge status: Home / Self Care | Payer: Medicaid Other | Attending: Emergency Medicine | Admitting: Emergency Medicine

## 2023-03-19 ENCOUNTER — Ambulatory Visit (INDEPENDENT_AMBULATORY_CARE_PROVIDER_SITE_OTHER): Payer: Medicaid Other

## 2023-03-19 DIAGNOSIS — J069 Acute upper respiratory infection, unspecified: Secondary | ICD-10-CM

## 2023-03-19 MED ORDER — LEVALBUTEROL TARTRATE 45 MCG/ACT IN AERO: 1.0000 | INHALATION_SPRAY | Freq: Four times a day (QID) | RESPIRATORY_TRACT | 0 refills | Status: DC | PRN

## 2023-03-19 MED ORDER — AMOXICILLIN-POT CLAVULANATE 875-125 MG PO TABS: 1.0000 | ORAL_TABLET | Freq: Two times a day (BID) | ORAL | 0 refills | Status: AC

## 2023-03-19 MED ORDER — PREDNISONE 10 MG PO TABS: 10.0000 mg | ORAL_TABLET | Freq: Every day | ORAL | 0 refills | Status: DC

## 2023-03-19 NOTE — ED Triage Notes (Signed)
Productive cough, congestion, side and back pain. Patient has history of her immune system being compromised. Patient is SOB and having trouble breathing. Has history of bronchitis.   Onset of symptoms  1-2 weeks ago. No known sick exposure.   Taking muscle relaxer and ibuprofen with no relief.

## 2023-03-19 NOTE — ED Provider Notes (Signed)
[facility]    CSN: [id number] Arrival date & time: 03/19/23  1011      History   Chief Complaint Chief Complaint  Patient presents with   Cough    HPI Kathleen Kane is a 62 y.o. female.   Patient presents for evaluation of nasal congestion, rhinorrhea, bilateral ear pain and fullness, sore throat, cough, shortness of breath and wheeze present for 2 weeks.  Shortness of breath and wheezing experienced at rest.  Cough is productive.  Has began to experience pain to the lower back and to the bilateral flanks, worse in by deep breathing.  No known sick contacts prior.  Tolerating food and liquids.  Has attempted use of ibuprofen and muscle relaxants which have been ineffective.  Endorses that she is ran out of her daily prednisone and has not taken in 2 weeks, has not notified PCP.  History of rheumatoid arthritis, CHF, interstitial lung disease, pneumonia, sleep apnea, morbid obesity, pulmonary fibrosis, diabetes.  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 AHI-6.8/hr, during REM-27.3/hr. RDI-25.0/hr, during REM-40.9/hr. 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   Dysphagia 01/26/2022   Personal history of colonic polyps 01/26/2022   Aortic atherosclerosis (HCC) 05/13/2021   Acute otalgia, right 11/26/2020   Arthralgia of right temporomandibular joint 11/26/2020   Bilateral impacted cerumen 11/26/2020   HCAP (healthcare-associated pneumonia) 10/02/2018   Chronic congestive heart failure (HCC) 10/01/2018   OSA (obstructive sleep apnea) 02/10/2017   Carpal tunnel syndrome, right 12/15/2016   Extensor tenosynovitis of wrist, right 12/15/2016   Bilateral primary osteoarthritis of knee 10/08/2015   Rheumatoid arthritis, 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   Acute on chronic diastolic CHF (congestive heart failure) (HCC) 08/08/2015   Morbid obesity (HCC) 05/06/2015   Essential hypertension 04/10/2015   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   Primary osteoarthritis involving multiple joints 04/09/2015   High risk medications (not anticoagulants) long-term use 02/04/2015   Elevated uric acid in blood 01/23/2014   High risk medication use 01/23/2014   Numbness and tingling in left hand 10/01/2013   Tobacco abuse 10/01/2013   Elevated CA-125 02/06/2013   Pelvic mass in female 01/29/2013   Diabetes mellitus (HCC) 08/10/2012   Atrial fibrillation (HCC)    Chronic diastolic CHF (congestive heart failure) (  [facility], [place])     Past Surgical History:  Procedure Laterality Date   CARDIOVASCULAR STRESS TEST  02/16/2013   no significant EKG changes with Lexiscan, normal LV function and normal wall function   CESAREAN SECTION     x2   DOPPLER ECHOCARDIOGRAPHY  01/24/2008   EF 55-60%, no diagnostic evidence of LV  wall motion abnormalities, LV wall thickness was mild-moderately increased.   HAND SURGERY Right 2018   LAPAROTOMY Right 02/27/2013   Procedure: EXPLORATORY LAPAROTOMY, right salpingo-oopherectomy;  Surgeon: Laurette Schimke, MD;  Location: [facility];  Service: Gynecology;  Laterality: Right;   left breast cyst     SALPINGOOPHORECTOMY Right 02/27/2013   Procedure: SALPINGO OOPHORECTOMY;  Surgeon: Laurette Schimke, MD;  Location: [facility];  Service: Gynecology;  Laterality: Right;   TUBAL LIGATION Bilateral 1989    OB History   No obstetric history on file.      Home Medications    Prior to Admission medications   Medication Sig Start Date End Date Taking? Authorizing Provider  apixaban (ELIQUIS) 5 MG TABS tablet TAKE 1 TABLET BY MOUTH TWICE A DAY 06/08/22  Yes Wendall Stade, MD  atorvastatin (LIPITOR) 20 MG tablet TAKE 1 TABLET (20 MG TOTAL) BY MOUTH DAILY. PLEASE SCHEDULE YEARLY APPOINTMENT FOR FUTURE REFILLS. 1ST ATTEMPT. THANK YOU 01/29/22  Yes Wendall Stade, MD  ferrous sulfate 325 (65 FE) MG tablet Take 1 tablet (325 mg total) by mouth daily with breakfast. 03/15/22  Yes Marinda Elk, MD  folic acid (FOLVITE) 1 MG tablet Take 1 mg by mouth every morning. 08/19/17  Yes [provider]  furosemide (LASIX) 40 MG tablet Take 1 tablet (40 mg total) by mouth daily. 07/06/22  Yes Carlisle Beers, FNP  losartan (COZAAR) 25 MG tablet TAKE 1 TABLET BY MOUTH EVERY DAY 10/29/19  Yes Wendall Stade, MD  metFORMIN (GLUCOPHAGE) 500 MG tablet Take 2 tablets (1,000 mg total) by mouth 2 (two) times daily. 10/03/18  Yes Glade Lloyd, MD  metolazone (ZAROXOLYN) 5 MG tablet Take 1 tablet (5 mg total) by mouth as needed (edema). Take 30 minutes prior to taking Furosemide ( lasix ) 11/29/22  Yes Wendall Stade, MD  metoprolol tartrate (LOPRESSOR) 25 MG tablet Take 1 tablet (25 mg total) by mouth 2 (two) times daily. 04/09/22  Yes Wendall Stade, MD  NOVOLOG MIX 70/30 FLEXPEN (70-30) 100  UNIT/ML FlexPen Inject 0.4 mLs (40 Units total) into the skin 2 (two) times daily with a meal. 10/03/18  Yes Hanley Ben, Kshitiz, MD  potassium chloride (KLOR-CON M) 10 MEQ tablet Take 1 tablet (10 mEq total) by mouth 2 (two) times daily. 07/07/22  Yes Wendall Stade, MD  Tocilizumab (ACTEMRA ACTPEN) 162 MG/0.9ML SOAJ Inject 162 mg into the skin every 7 (seven) days. 02/07/23  Yes Kalman Shan, MD  Accu-Chek FastClix Lancets MISC Apply topically. 08/04/20   [provider]  ACCU-CHEK GUIDE test strip  08/14/20   [provider]  B-D ULTRAFINE III SHORT PEN 31G X 8 MM MISC Inject into the skin 2 (two) times daily. 06/30/20   [provider]  blood glucose meter kit and supplies Dispense based on patient and insurance preference. Use up to four times daily as directed. (FOR ICD-10 E10.9, E11.9). 10/03/18   Glade Lloyd, MD  levalbuterol [facility] HFA) 45 MCG/ACT inhaler Inhale 1-2 puffs into the lungs every 6 (six) hours as needed for wheezing. 04/19/22   Parrett, Virgel Bouquet, NP  predniSONE (DELTASONE) 10 MG  tablet Take 10 mg by mouth daily. 08/31/22   [provider]  spironolactone (ALDACTONE) 25 MG tablet Take 0.5 tablets (12.5 mg total) by mouth daily. 04/21/22   Swinyer, Zachary George, NP    Family History Family History  Problem Relation Age of Onset   Hypertension Mother    Diabetes Mother    Throat cancer Mother    Hypertension Father    Diabetes Sister    Heart Problems Sister    Kidney failure Sister    Hypertension Brother    Diabetes Brother    Diabetes Paternal Grandmother    Autoimmune disease Daughter    Healthy Son     Social History Social History   Tobacco Use   Smoking status: Former    Packs/day: 1.00    Years: 36.00    Additional pack years: 0.00    Total pack years: 36.00    Types: Cigarettes    Start date: 71    Quit date: 06/08/2014    Years since quitting: 8.7    Passive exposure: Never   Smokeless tobacco: Never  Vaping Use    Vaping Use: Former  Substance Use Topics   Alcohol use: No    Alcohol/week: 0.0 standard drinks of alcohol   Drug use: No     Allergies   Ace inhibitors and Lisinopril   Review of Systems Review of Systems  Constitutional: Negative.   HENT:  Positive for congestion, ear pain, rhinorrhea and sore throat. Negative for dental problem, drooling, ear discharge, facial swelling, hearing loss, mouth sores, nosebleeds, postnasal drip, sinus pressure, sinus pain, sneezing, tinnitus, trouble swallowing and voice change.   Respiratory:  Positive for cough, shortness of breath and wheezing. Negative for apnea, choking, chest tightness and stridor.   Cardiovascular: Negative.   Gastrointestinal: Negative.   Skin: Negative.   Neurological: Negative.      Physical Exam Triage Vital Signs ED Triage Vitals  Enc Vitals Group     BP 03/19/23 1039 136/80     Pulse Rate 03/19/23 1039 84     Resp 03/19/23 1039 20     Temp 03/19/23 1039 98 F (36.7 C)     Temp Source 03/19/23 1039 Oral     SpO2 03/19/23 1039 93 %     Weight --      Height --      Head Circumference --      Peak Flow --      Pain Score 03/19/23 1037 9     Pain Loc --      Pain Edu? --      Excl. in GC? --    No data found.  Updated Vital Signs BP 136/80 (BP Location: Left Arm)   Pulse 84   Temp 98 F (36.7 C) (Oral)   Resp 20   LMP 10/28/2013   SpO2 93%   Visual Acuity Right Eye Distance:   Left Eye Distance:   Bilateral Distance:    Right Eye Near:   Left Eye Near:    Bilateral Near:     Physical Exam Constitutional:      Appearance: Normal appearance.  HENT:     Head: Normocephalic.     Right Ear: Tympanic membrane, ear canal and external ear normal.     Left Ear: Tympanic membrane, ear canal and external ear normal.     Nose: Congestion and rhinorrhea present.     Mouth/Throat:     Mouth: Mucous membranes are moist.  Pharynx: No posterior oropharyngeal erythema.  Eyes:     Extraocular  Movements: Extraocular movements intact.  Cardiovascular:     Rate and Rhythm: Normal rate and regular rhythm.     Pulses: Normal pulses.     Heart sounds: Normal heart sounds.  Pulmonary:     Effort: Pulmonary effort is normal.     Comments: Diminished and clear Musculoskeletal:        General: Normal range of motion.  Skin:    General: Skin is warm and dry.  Neurological:     Mental Status: She is alert and oriented to person, place, and time. Mental status is at baseline.      UC Treatments / Results  Labs (all labs ordered are listed, but only abnormal results are displayed) Labs Reviewed - No data to display  EKG   Radiology DG Chest 2 View  Result Date: 03/19/2023 CLINICAL DATA:  Dyspnea EXAM: CHEST - 2 VIEW COMPARISON:  09/29/2022 FINDINGS: Pulmonary insufflation is normal and symmetric. Fine reticular infiltrate asymmetrically within the right upper lobe and left perihilar region is again identified, likely reflecting the fibrotic changes better appreciated on CT examination of 09/29/2022. No superimposed confluent pulmonary infiltrate. No pneumothorax or pleural effusion. Stable cardiomegaly. No acute bone abnormality. IMPRESSION: 1. No radiographic evidence of acute cardiopulmonary disease. 2. Stable cardiomegaly. Electronically Signed   By: Helyn Numbers M.D.   On: 03/19/2023 11:01    Procedures Procedures (including critical care time)  Medications Ordered in UC Medications - No data to display  Initial Impression / Assessment and Plan / UC Course  I have reviewed the triage vital signs and the nursing notes.  Pertinent labs & imaging results that were available during my care of the patient were reviewed by me and considered in my medical decision making (see chart for details).  Acute upper respiratory infection  Patient is in no signs of distress nor toxic appearing.  Vital signs are stable.  Shortness of breath and wheezing have been present for 2 weeks,  chest x-ray completed, negative for acute findings, discussed with patient and family.  Prescribed Augmentin as symptoms have been present for 2 weeks, additionally refilled prednisone 10 mg daily, patient to follow-up with pulmonologist for further management, refilled Xopenex inhaler. May use additional over-the-counter medications as needed for supportive care.  May follow-up with urgent care as needed if symptoms persist or worsen.  Final Clinical Impressions(s) / UC Diagnoses   Final diagnoses:  None   Discharge Instructions   None    ED Prescriptions   None    PDMP not reviewed this encounter.   Valinda Hoar, NP 03/19/23 1128

## 2023-03-19 NOTE — Discharge Instructions (Signed)
As her symptoms have been present for 2 weeks we will provide bacterial coverage what may be causing her symptoms to prolong, this is infection to the upper airways and not the lungs,  Chest x-ray is negative for changes to the lungs  Begin Augmentin every morning and every evening for 10 days  Take prednisone everyday as directed, has reach out to your primary doctor for further refills  May use your Xopenex inhaler taking 1 to 2 puffs every 6 hours as needed for shortness of breath    You can take Tylenol and/or Ibuprofen as needed for fever reduction and pain relief.   For cough: honey 1/2 to 1 teaspoon (you can dilute the honey in water or another fluid).  You can also use guaifenesin and dextromethorphan for cough. You can use a humidifier for chest congestion and cough.  If you don't have a humidifier, you can sit in the bathroom with the hot shower running.      For sore throat: try warm salt water gargles, cepacol lozenges, throat spray, warm tea or water with lemon/honey, popsicles or ice, or OTC cold relief medicine for throat discomfort.   For congestion: take a daily anti-histamine like Zyrtec, Claritin, and a oral decongestant, such as pseudoephedrine.  You can also use Flonase 1-2 sprays in each nostril daily.   It is important to stay hydrated: drink plenty of fluids (water, gatorade/powerade/pedialyte, juices, or teas) to keep your throat moisturized and help further relieve irritation/discomfort.

## 2023-03-24 ENCOUNTER — Other Ambulatory Visit (HOSPITAL_COMMUNITY): Payer: Self-pay

## 2023-03-24 ENCOUNTER — Other Ambulatory Visit: Payer: Self-pay | Admitting: Internal Medicine

## 2023-03-24 DIAGNOSIS — M059 Rheumatoid arthritis with rheumatoid factor, unspecified: Secondary | ICD-10-CM

## 2023-03-24 DIAGNOSIS — M359 Systemic involvement of connective tissue, unspecified: Secondary | ICD-10-CM

## 2023-03-24 DIAGNOSIS — Z79899 Other long term (current) drug therapy: Secondary | ICD-10-CM

## 2023-03-24 DIAGNOSIS — J8489 Other specified interstitial pulmonary diseases: Secondary | ICD-10-CM

## 2023-03-25 ENCOUNTER — Other Ambulatory Visit: Payer: Self-pay

## 2023-03-25 MED ORDER — ACTEMRA ACTPEN 162 MG/0.9ML ~~LOC~~ SOAJ
162.0000 mg | SUBCUTANEOUS | 1 refills | Status: DC
Start: 2023-03-25 — End: 2023-05-23
  Filled 2023-03-25: qty 3.6, 28d supply, fill #0
  Filled 2023-04-27 – 2023-05-07 (×2): qty 3.6, 28d supply, fill #1

## 2023-03-31 ENCOUNTER — Other Ambulatory Visit (HOSPITAL_COMMUNITY): Payer: Self-pay

## 2023-04-08 ENCOUNTER — Other Ambulatory Visit (HOSPITAL_COMMUNITY): Payer: Self-pay

## 2023-04-16 ENCOUNTER — Emergency Department (HOSPITAL_COMMUNITY): Payer: Medicaid Other

## 2023-04-16 ENCOUNTER — Emergency Department (HOSPITAL_COMMUNITY)
Admission: EM | Admit: 2023-04-16 | Discharge: 2023-04-16 | Disposition: A | Discharge status: Home / Self Care | Payer: Medicaid Other | Attending: Emergency Medicine | Admitting: Emergency Medicine

## 2023-04-16 ENCOUNTER — Other Ambulatory Visit: Payer: Self-pay

## 2023-04-16 DIAGNOSIS — M549 Dorsalgia, unspecified: Secondary | ICD-10-CM

## 2023-04-16 DIAGNOSIS — M545 Low back pain, unspecified: Secondary | ICD-10-CM | POA: Diagnosis present

## 2023-04-16 DIAGNOSIS — Z7901 Long term (current) use of anticoagulants: Secondary | ICD-10-CM | POA: Diagnosis not present

## 2023-04-16 DIAGNOSIS — R319 Hematuria, unspecified: Secondary | ICD-10-CM | POA: Diagnosis not present

## 2023-04-16 LAB — COMPREHENSIVE METABOLIC PANEL
ALT: 31 U/L (ref 0–44)
AST: 19 U/L (ref 15–41)
Albumin: 3.8 g/dL (ref 3.5–5.0)
Alkaline Phosphatase: 44 U/L (ref 38–126)
Anion gap: 12 (ref 5–15)
BUN: 27 mg/dL — ABNORMAL HIGH (ref 8–23)
CO2: 21 mmol/L — ABNORMAL LOW (ref 22–32)
Calcium: 9.4 mg/dL (ref 8.9–10.3)
Chloride: 108 mmol/L (ref 98–111)
Creatinine, Ser: 0.87 mg/dL (ref 0.44–1.00)
GFR, Estimated: >60 mL/min (ref >60)
Glucose, Bld: 76 mg/dL (ref 70–99)
Potassium: 3.6 mmol/L (ref 3.5–5.1)
Sodium: 141 mmol/L (ref 135–145)
Total Bilirubin: 0.8 mg/dL (ref 0.3–1.2)
Total Protein: 7 g/dL (ref 6.5–8.1)

## 2023-04-16 LAB — URINALYSIS, ROUTINE W REFLEX MICROSCOPIC
Bilirubin Urine: NEGATIVE
Glucose, UA: NEGATIVE mg/dL
Ketones, ur: NEGATIVE mg/dL
Nitrite: NEGATIVE
Protein, ur: NEGATIVE mg/dL
Specific Gravity, Urine: 1.014 (ref 1.005–1.030)
pH: 5 (ref 5.0–8.0)

## 2023-04-16 LAB — CBC
HCT: 42.6 % (ref 36.0–46.0)
Hemoglobin: 13.9 g/dL (ref 12.0–15.0)
MCH: 31.3 pg (ref 26.0–34.0)
MCHC: 32.6 g/dL (ref 30.0–36.0)
MCV: 95.9 fL (ref 80.0–100.0)
Platelets: 195 10*3/uL (ref 150–400)
RBC: 4.44 MIL/uL (ref 3.87–5.11)
RDW: 13.9 % (ref 11.5–15.5)
WBC: 5.4 10*3/uL (ref 4.0–10.5)
nRBC: 0 % (ref 0.0–0.2)

## 2023-04-16 LAB — LIPASE, BLOOD: Lipase: 54 U/L — ABNORMAL HIGH (ref 11–51)

## 2023-04-16 MED ORDER — OXYCODONE-ACETAMINOPHEN 5-325 MG PO TABS: 1.0000 | ORAL_TABLET | Freq: Four times a day (QID) | ORAL | 0 refills | Status: DC | PRN

## 2023-04-16 MED ORDER — OXYCODONE-ACETAMINOPHEN 5-325 MG PO TABS
1.0000 | ORAL_TABLET | Freq: Once | ORAL | Status: AC
  Administered 2023-04-16: 1 via ORAL
  Filled 2023-04-16: qty 1

## 2023-04-16 MED ORDER — DIAZEPAM 5 MG PO TABS: 5.0000 mg | ORAL_TABLET | Freq: Four times a day (QID) | ORAL | 0 refills | Status: AC | PRN

## 2023-04-16 MED ORDER — DIAZEPAM 5 MG PO TABS
10.0000 mg | ORAL_TABLET | Freq: Once | ORAL | Status: AC
  Administered 2023-04-16: 10 mg via ORAL
  Filled 2023-04-16: qty 2

## 2023-04-16 NOTE — Discharge Instructions (Signed)
Blood was noted in your urine today.  Follow-up with your doctor for repeat test.

## 2023-04-16 NOTE — ED Provider Notes (Signed)
[facility] Provider Note   CSN: [id number] Arrival date & time: 04/16/23  1150     History  Chief Complaint  Patient presents with   Abdominal Pain    Kathleen Kane is a 62 y.o. female.  62 year old female who presents with low back pain x 1 week.  Pain is worse with certain positions.  It does not radiate down her legs.  Patient uses a cane and has been walking at odd angles.  Notes that her pain is worse with certain positions.  No urinary symptoms.  No bowel or bladder incontinence.  Patient [name] is some abdominal bloating.  Last bowel movement was today.  No prior history of back surgery.  Does have his rheumatoid arthritis.  Pain unrelieved with home medications       Home Medications Prior to Admission medications   Medication Sig Start Date End Date Taking? Authorizing Provider  Accu-Chek FastClix Lancets MISC Apply topically. 08/04/20   [provider]  ACCU-CHEK GUIDE test strip  08/14/20   [provider]  apixaban (ELIQUIS) 5 MG TABS tablet TAKE 1 TABLET BY MOUTH TWICE A DAY 06/08/22   Wendall Stade, MD  atorvastatin (LIPITOR) 20 MG tablet TAKE 1 TABLET (20 MG TOTAL) BY MOUTH DAILY. PLEASE SCHEDULE YEARLY APPOINTMENT FOR FUTURE REFILLS. 1ST ATTEMPT. THANK YOU 01/29/22   Wendall Stade, MD  B-D ULTRAFINE III SHORT PEN 31G X 8 MM MISC Inject into the skin 2 (two) times daily. 06/30/20   [provider]  blood glucose meter kit and supplies Dispense based on patient and insurance preference. Use up to four times daily as directed. (FOR ICD-10 E10.9, E11.9). 10/03/18   Glade Lloyd, MD  ferrous sulfate 325 (65 FE) MG tablet Take 1 tablet (325 mg total) by mouth daily with breakfast. 03/15/22   Marinda Elk, MD  folic acid (FOLVITE) 1 MG tablet Take 1 mg by mouth every morning. 08/19/17   [provider]  furosemide (LASIX) 40 MG tablet Take 1 tablet (40 mg total) by mouth daily. 07/06/22    Carlisle Beers, FNP  levalbuterol [facility] HFA) 45 MCG/ACT inhaler Inhale 1-2 puffs into the lungs every 6 (six) hours as needed for wheezing. 03/19/23   Valinda Hoar, NP  losartan (COZAAR) 25 MG tablet TAKE 1 TABLET BY MOUTH EVERY DAY 10/29/19   Wendall Stade, MD  metFORMIN (GLUCOPHAGE) 500 MG tablet Take 2 tablets (1,000 mg total) by mouth 2 (two) times daily. 10/03/18   Glade Lloyd, MD  metolazone (ZAROXOLYN) 5 MG tablet Take 1 tablet (5 mg total) by mouth as needed (edema). Take 30 minutes prior to taking Furosemide ( lasix ) 11/29/22   Wendall Stade, MD  metoprolol tartrate (LOPRESSOR) 25 MG tablet Take 1 tablet (25 mg total) by mouth 2 (two) times daily. 04/09/22   Wendall Stade, MD  NOVOLOG MIX 70/30 FLEXPEN (70-30) 100 UNIT/ML FlexPen Inject 0.4 mLs (40 Units total) into the skin 2 (two) times daily with a meal. 10/03/18   Glade Lloyd, MD  potassium chloride (KLOR-CON M) 10 MEQ tablet Take 1 tablet (10 mEq total) by mouth 2 (two) times daily. 07/07/22   Wendall Stade, MD  predniSONE (DELTASONE) 10 MG tablet Take 1 tablet (10 mg total) by mouth daily. 03/19/23   Valinda Hoar, NP  spironolactone (ALDACTONE) 25 MG tablet Take 0.5 tablets (12.5 mg total) by mouth daily. 04/21/22   Swinyer, Marcelino Duster  M, NP  Tocilizumab (ACTEMRA ACTPEN) 162 MG/0.9ML SOAJ Inject 162 mg into the skin every 7 (seven) days. 03/25/23   Kalman Shan, MD      Allergies    Ace inhibitors and Lisinopril    Review of Systems   Review of Systems  All other systems reviewed and are negative.   Physical Exam Updated Vital Signs BP (!) 147/80 (BP Location: Left Arm)   Pulse 64   Temp 98.7 F (37.1 C) (Oral)   Resp 16   Ht 1.575 m (5\' 2" )   Wt 108.4 kg   LMP 10/28/2013   SpO2 96%   BMI 43.71 kg/m  Physical Exam Vitals and nursing note reviewed.  Constitutional:      General: She is not in acute distress.    Appearance: Normal appearance. She is well-developed. She is not  toxic-appearing.  HENT:     Head: Normocephalic and atraumatic.  Eyes:     General: Lids are normal.     Conjunctiva/sclera: Conjunctivae normal.     Pupils: Pupils are equal, round, and reactive to light.  Neck:     Thyroid: No thyroid mass.     Trachea: No tracheal deviation.  Cardiovascular:     Rate and Rhythm: Normal rate and regular rhythm.     Heart sounds: Normal heart sounds. No murmur heard.    No gallop.  Pulmonary:     Effort: Pulmonary effort is normal. No respiratory distress.     Breath sounds: Normal breath sounds. No stridor. No decreased breath sounds, wheezing, rhonchi or rales.  Abdominal:     General: There is no distension.     Palpations: Abdomen is soft.     Tenderness: There is no abdominal tenderness. There is no rebound.  Musculoskeletal:        General: No tenderness. Normal range of motion.     Cervical back: Normal range of motion and neck supple.       Back:  Skin:    General: Skin is warm and dry.     Findings: No abrasion or rash.  Neurological:     General: No focal deficit present.     Mental Status: She is alert and oriented to person, place, and time. Mental status is at baseline.     GCS: GCS eye subscore is 4. GCS verbal subscore is 5. GCS motor subscore is 6.     Cranial Nerves: No cranial nerve deficit.     Sensory: No sensory deficit.     Motor: Motor function is intact.  Psychiatric:        Attention and Perception: Attention normal.        Speech: Speech normal.        Behavior: Behavior normal.     ED Results / Procedures / Treatments   Labs (all labs ordered are listed, but only abnormal results are displayed) Labs Reviewed  LIPASE, BLOOD  COMPREHENSIVE METABOLIC PANEL  CBC  URINALYSIS, ROUTINE W REFLEX MICROSCOPIC    EKG None  Radiology No results found.  Procedures Procedures    Medications Ordered in ED Medications  diazepam (VALIUM) tablet 10 mg (has no administration in time range)   oxyCODONE-acetaminophen (PERCOCET/ROXICET) 5-325 MG per tablet 1 tablet (has no administration in time range)    ED Course/ Medical Decision Making/ A&P                             Medical  Decision Making Amount and/or Complexity of Data Reviewed Labs: ordered. Radiology: ordered.  Risk Prescription drug management.   Patient medicated for pain here and feels better.  She has musculoskeletal back pain that is reproducible.  X-ray of her lumbar and thoracic spine per interpretation showed no acute findings.  Her urinalysis does show some hematuria however patient does not have any UTI or kidney stone type symptoms.  She will follow-up with her doctor for this.  Labs are otherwise reassuring.  Plan will be to prescribe patient muscle relaxants and analgesics and see her doctor.  Daughter at bedside and is agreeable to this        Final Clinical Impression(s) / ED Diagnoses Final diagnoses:  None    Rx / DC Orders ED Discharge Orders     None         Lorre Nick, MD 04/16/23 1501

## 2023-04-16 NOTE — ED Triage Notes (Signed)
Patient c/o mid/lower back pain Radiating across back and down to buttock area  Abdominal bloating/pain X1 week Denies injury Denies difficulty urinating Denies lack of bowel movements

## 2023-04-20 ENCOUNTER — Ambulatory Visit (INDEPENDENT_AMBULATORY_CARE_PROVIDER_SITE_OTHER): Payer: Medicaid Other | Admitting: Podiatry

## 2023-04-20 DIAGNOSIS — Z91199 Patient's noncompliance with other medical treatment and regimen due to unspecified reason: Secondary | ICD-10-CM

## 2023-04-20 NOTE — Progress Notes (Signed)
1. No-show for appointment     

## 2023-04-26 ENCOUNTER — Other Ambulatory Visit (HOSPITAL_COMMUNITY): Payer: Self-pay

## 2023-04-27 ENCOUNTER — Other Ambulatory Visit (HOSPITAL_COMMUNITY): Payer: Self-pay

## 2023-05-03 ENCOUNTER — Ambulatory Visit: Payer: Medicaid Other | Admitting: Podiatry

## 2023-05-04 ENCOUNTER — Other Ambulatory Visit (HOSPITAL_COMMUNITY): Payer: Self-pay

## 2023-05-04 ENCOUNTER — Other Ambulatory Visit: Payer: Self-pay | Admitting: Internal Medicine

## 2023-05-05 LAB — URINE CULTURE
MICRO NUMBER:: 15130429
Result:: NO GROWTH
SPECIMEN QUALITY:: ADEQUATE

## 2023-05-07 ENCOUNTER — Other Ambulatory Visit (HOSPITAL_COMMUNITY): Payer: Self-pay

## 2023-05-19 ENCOUNTER — Ambulatory Visit
Admission: RE | Admit: 2023-05-19 | Discharge: 2023-05-19 | Disposition: A | Discharge status: Home / Self Care | Payer: Medicaid Other | Source: Ambulatory Visit | Attending: Internal Medicine | Admitting: Internal Medicine

## 2023-05-19 ENCOUNTER — Other Ambulatory Visit: Payer: Medicaid Other

## 2023-05-19 DIAGNOSIS — J8489 Other specified interstitial pulmonary diseases: Secondary | ICD-10-CM

## 2023-05-20 ENCOUNTER — Other Ambulatory Visit (HOSPITAL_COMMUNITY): Payer: Self-pay

## 2023-05-20 ENCOUNTER — Ambulatory Visit (HOSPITAL_COMMUNITY): Payer: Medicaid Other

## 2023-05-23 ENCOUNTER — Other Ambulatory Visit (HOSPITAL_COMMUNITY): Payer: Self-pay

## 2023-05-23 ENCOUNTER — Ambulatory Visit (INDEPENDENT_AMBULATORY_CARE_PROVIDER_SITE_OTHER): Payer: Medicaid Other | Admitting: Internal Medicine

## 2023-05-23 ENCOUNTER — Encounter: Payer: Self-pay | Admitting: Internal Medicine

## 2023-05-23 ENCOUNTER — Ambulatory Visit: Payer: Medicaid Other | Admitting: Internal Medicine

## 2023-05-23 ENCOUNTER — Other Ambulatory Visit: Payer: Self-pay | Admitting: Internal Medicine

## 2023-05-23 VITALS — BP 140/90 | HR 83 | Ht 62.75 in | Wt 238.0 lb

## 2023-05-23 DIAGNOSIS — Z7952 Long term (current) use of systemic steroids: Secondary | ICD-10-CM

## 2023-05-23 DIAGNOSIS — M359 Systemic involvement of connective tissue, unspecified: Secondary | ICD-10-CM | POA: Diagnosis not present

## 2023-05-23 DIAGNOSIS — J8489 Other specified interstitial pulmonary diseases: Secondary | ICD-10-CM | POA: Diagnosis not present

## 2023-05-23 DIAGNOSIS — M059 Rheumatoid arthritis with rheumatoid factor, unspecified: Secondary | ICD-10-CM

## 2023-05-23 DIAGNOSIS — Z79899 Other long term (current) drug therapy: Secondary | ICD-10-CM

## 2023-05-23 DIAGNOSIS — Z77120 Contact with and (suspected) exposure to mold (toxic): Secondary | ICD-10-CM

## 2023-05-23 DIAGNOSIS — D849 Immunodeficiency, unspecified: Secondary | ICD-10-CM

## 2023-05-23 DIAGNOSIS — Z5181 Encounter for therapeutic drug level monitoring: Secondary | ICD-10-CM | POA: Diagnosis not present

## 2023-05-23 LAB — PULMONARY FUNCTION TEST
DL/VA % pred: 76 %
DL/VA: 3.23 ml/min/mmHg/L
DLCO cor % pred: 56 %
DLCO cor: 10.75 ml/min/mmHg
DLCO unc % pred: 57 %
DLCO unc: 10.92 ml/min/mmHg
FEF 25-75 Pre: 3.76 L/sec
FEF2575-%Pred-Pre: 178 %
FEV1-%Pred-Pre: 81 %
FEV1-Pre: 1.9 L
FEV1FVC-%Pred-Pre: 119 %
FEV6-%Pred-Pre: 70 %
FEV6-Pre: 2.05 L
FEV6FVC-%Pred-Pre: 104 %
FVC-%Pred-Pre: 67 %
FVC-Pre: 2.05 L
Pre FEV1/FVC ratio: 93 %
Pre FEV6/FVC Ratio: 100 %

## 2023-05-23 LAB — CBC WITH DIFFERENTIAL/PLATELET
Basophils Absolute: 0.1 10*3/uL (ref 0.0–0.1)
Basophils Relative: 0.7 % (ref 0.0–3.0)
Eosinophils Absolute: 0.1 10*3/uL (ref 0.0–0.7)
Eosinophils Relative: 1.4 % (ref 0.0–5.0)
HCT: 43.9 % (ref 36.0–46.0)
Hemoglobin: 14.7 g/dL (ref 12.0–15.0)
Lymphocytes Relative: 18.2 % (ref 12.0–46.0)
Lymphs Abs: 1.5 10*3/uL (ref 0.7–4.0)
MCHC: 33.6 g/dL (ref 30.0–36.0)
MCV: 93 fl (ref 78.0–100.0)
Monocytes Absolute: 0.5 10*3/uL (ref 0.1–1.0)
Monocytes Relative: 6 % (ref 3.0–12.0)
Neutro Abs: 6 10*3/uL (ref 1.4–7.7)
Neutrophils Relative %: 73.7 % (ref 43.0–77.0)
Platelets: 234 10*3/uL (ref 150.0–400.0)
RBC: 4.72 Mil/uL (ref 3.87–5.11)
RDW: 13.5 % (ref 11.5–15.5)
WBC: 8.1 10*3/uL (ref 4.0–10.5)

## 2023-05-23 MED ORDER — PREDNISONE 10 MG PO TABS: 10.0000 mg | ORAL_TABLET | Freq: Every day | ORAL | 2 refills | Status: DC

## 2023-05-23 MED ORDER — SULFAMETHOXAZOLE-TRIMETHOPRIM 800-160 MG PO TABS: 1.0000 | ORAL_TABLET | ORAL | 2 refills | Status: DC

## 2023-05-23 NOTE — Addendum Note (Signed)
Addended by: Hedda Slade on: 05/23/2023 10:56 AM   Modules accepted: Orders

## 2023-05-23 NOTE — Patient Instructions (Addendum)
ICD-10-CM   1. Interstitial lung disease due to connective tissue disease (HCC)  J84.89    M35.9     2. Rheumatoid arthritis, 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HCC)  M05.9     3. High risk medication use  Z79.899     4. Current chronic use of systemic steroids  Z79.52     5. Immunosuppressed status (HCC)  D84.9     6. Mold suspected exposure  Z77.120     7. Morbid obesity (HCC)  E66.01       RA pain improved with Actemra since jan 2024 and  daily prednisone 10 mg/day. Pulmonary fibrosis itself is stable in the last 1 year especially after starting Actemra. Noted that you might have some suspected mold exposure at home because of dampness Highly encourage weight loss through medication management  Plan -Continue biologic Actemra weekly injection given benefit -Continue prednisone 10 mg/day; at a future visit can look at reducing it -Start Bactrim 1 double strength tablet Monday Wednesday Friday to prevent opportunistic infections -= check cbc, bmet, LFT, 05/23/2023  for safety monitoriung  -Any side effect such as rash please let us know -Do spirometry and DLCO in 16 weeks -20 weeks -Talk to primary care doctor about weight loss drugs suggest Ozempic etc.,    Follow-up - Return in 16-20 weeks to see Dr Marchelle Gearing -30 min slot but after PFT   -Symptom score and walk test at follow-up

## 2023-05-23 NOTE — Progress Notes (Signed)
OV 04/08/2022 -rheumatoid arthritis immunosuppressed.  Has interstitial lung disease findings.  Subjective:  Patient ID: Kathleen Kane, female , DOB: [birth year] , age 62 y.o. , MRN: [id number] , ADDRESS: [address] [place] [address] PCP Fleet Contras, MD Patient Care Team: Fleet Contras, MD as PCP - General (Internal Medicine) Wendall Stade, MD as PCP - Cardiology (Cardiology)  This Provider for this visit: Treatment Team:  Attending Provider: Kalman Shan, MD    04/08/2022 -   Chief Complaint  Patient presents with   Consult    Pt recently had some imaging performed which is the reason for today's visit. States she was also recently in the hospital. Pt does have current complaints of cough which is worse at night.     Kathleen Kane 62 y.o. -history provided by review of the chart talking to her and the daughter.  Obese lady presenting with her daughter.  She has a long history of rheumatoid arthritis diagnosed 8 years ago according to history.  She used to be a patient of Dr. Corliss Skains but after having failed different immunosuppression agents [efficacy issues versus side effect such as infections] she has been discharged from follow-up at the rheumatology clinic.  Recommendation was to do prednisone.  Last face-to-face visit was in December 2022 at rheumatolog wth Dr D and with the PA Sherron Ales in March 2023.  Rheumatoid arthritis history: She has RA and osteoarthritis.  Diagnosis seropositive although I could not locate seropositivity in the chart.  Charts indicate that previously she been in a great response to Plaquenil, Humira and Remicade.  Deemed poor candidate for TNF alpha inhibitors due to history of congestive heart failure.  Most recently she was on Orencia and Rasuvo but it appears that she stopped this in October 28, 2011 for "recurrent bouts of pneumonia as well as wound infection of the length of the inguinal region".  In March 2023  she expressed concern about restarting these agents because of concern for side effects.  She expressed interest in doing long-term prednisone to rheumatology in March 2023.  Patient was advised against using long-term prednisone.  She then wanted to see a different rheumatologist.  Referral was made but according to the patient nobody will see her because she has Medicaid.  She has been on chronic steroids according to the chart review since 2015.  But she tells me that she only takes it intermittently although chart says she is wanting to take this and takes it continuously.  She also has osteoarthritis of the knees.  She has idiopathic gout.  She has rheumatoid nodules  Other pertinent past medical history include: OSA unclear if she is taking CPAP, chronic atrial fibrillation sees Dr. Loleta Dicker on Eliquis and metoprolol, chronic diastolic heart failure, type 2 diabetes, vitamin D deficiency.  She has chronic anemia.  Hemoglobin in May 2023 was 10 g% and baseline.   Current: In the back of all this she was admitted 03/11/2022 for 4 days and discharged 03/15/2022 with a history of hemoptysis for [redacted] week along with acute hypoxemic respiratory failure requiring 4 L nasal cannula.  An echocardiogram apparently showed restricted posterior mitral valve leaflet with moderate mitral regurgitation EF 55%.  [Several months ago she seemed to have a high elevated pulmonary pressures on echo].  According to her in the chart she was treated with oxygen therapy antibiotics steroids and diuresis.  She did improve.  She is off oxygen.  She says  she is also off steroids at this point.  She had a CT scan of the chest there was angiogram.  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  Therefore she has been referred here.  Her daughter states that they are aware of ILD changes even a few years ago [2019 high-resolution CT chest showed some groundglass opacities and that the only other CT scan of the chest].  At this point  in time she is back to baseline there is no more hemoptysis she is able to do ADLs.  She does use a wheelchair when she goes shopping out of the doctor's office but she is able to walk around the house.  In fact today she was able to walk 2 out of the 3 laps and stopped because of joint issues and fatigue.  She did not desaturate.   Lab review: Most recent creatinine 1 mg percent 03/15/2022  04/19/2022 Follow up : RA-ILD  Patient returns for a 2-week follow-up.  Patient was seen last visit for a pulmonary consult for interstitial lung disease.  Patient has underlying rheumatoid arthritis.  Says she has been on multiple treatments in the past including methotrexate, Humira, Orencia but is currently off of all therapy due to intolerance.  Patient says that she has been discharged from her rheumatologist recently and needs a new rheumatologist.  Patient was set up for high-resolution CT chest completed on April 16, 2022 that shows patchy pulmonary parenchymal groundglass, coarsening bronchiectasis and subpleural reticulation.  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  Mediastinal adenopathy in the setting of ILD and appears slightly improved from CT on Mar 09, 2022.  Lab work shows decrease sed rate at 37, positive CCP greater than 250, negative ANA, positive rheumatoid factor at 904, decreased BNP at 169.  QuantiFERON gold was -1-year ago most recent test showed indeterminate.  2D echo is pending.  Was admitted to the hospital early May for acute respiratory failure felt secondary to possible atypical pneumonia.  She was treated with empiric antibiotics and steroids.  Along with nebulized bronchodilators.  Weaned off of oxygen prior to discharge. Patient says overall she is actually feeling better since discharge.  She has had less joint swelling and pain.  Breathing has been better.  She says since coming off the  methotrexate she is felt significantly improved. She was set up for pulmonary function testing completed on April 13, 2022 that showed no airflow obstruction or restriction.  FEV1 116%, ratio 91, FVC 99%, positive bronchodilator response, DLCO decreased at 54%.Marland Kitchen  She was set up for an overnight oximetry test.  This is showed minimum desaturations with O2 saturations less than 88%.  7 minutes and 40 seconds.  *Test results.  Patient wants to hold off on starting oxygen.   T/EVENTS :  04/14/2022 ONO <88% for 7 minutes and 40 seconds  CT chest Mar 09, 2022 shows extensive fibrotic changes with associated bronchiectasis bilaterally   Echo 04/22/22 - IMPRESSIONS     1. Left ventricular ejection fraction, by estimation, is 60 to 65%. The  left ventricle has normal function. The left ventricle has no regional  wall motion abnormalities. There is moderate concentric left ventricular  hypertrophy. Left ventricular  diastolic parameters are indeterminate.   2. Right ventricular systolic function is normal. The right ventricular  size is normal.   3. Left atrial size was moderately dilated.   4. The mitral valve is  normal in structure. Mild mitral valve  regurgitation.   5. The aortic valve is normal in structure. Aortic valve regurgitation is  not visualized.   6. The inferior vena cava is normal in size with greater than 50%  respiratory variability, suggesting right atrial pressure of 3 mmHg.   OV 07/27/2022  Subjective:  Patient ID: Kathleen Kane, female , DOB: 05/16/1961 , age 47 y.o. , MRN: [id number] , ADDRESS: [address] [place] [address] PCP Fleet Contras, MD Patient Care Team: Fleet Contras, MD as PCP - General (Internal Medicine) Wendall Stade, MD as PCP - Cardiology (Cardiology)  This Provider for this visit: Treatment Team:  Attending Provider: Kalman Shan, MD    07/27/2022 -   Chief Complaint  Patient presents with   Follow-up    SOB/ cough improved      Kathleen Kathleen Kane 62 y.o. -presents with daughter Hospital doctor.  She is reports continued improvement in her respiratory status particularly after stopping methotrexate in the hospitalization.  She had a CT scan of the chest a few months ago and this showed NSIP pattern of ILD.  She believes that this is stable.  However she is dealing with significant joint pain and this is making her short of breath.  She thinks her joint pain is because of rheumatoid arthritis and her weight causing shortness of breath.  She not having any cough.  The cough is significantly improved.  She is frustrated that no rheumatologist will see her.  OV 10/19/2022  Subjective:  Patient ID: Kathleen Kane, female , DOB: 03/14/1961 , age 64 y.o. , MRN: [id number] , ADDRESS: [address] [place] [address] PCP Fleet Contras, MD Patient Care Team: Fleet Contras, MD as PCP - General (Internal Medicine) Wendall Stade, MD as PCP - Cardiology (Cardiology)  This Provider for this visit: Treatment Team:  Attending Provider: Kalman Shan, MD    10/19/2022 -   Chief Complaint  Patient presents with   Follow-up    PFT performed today.  Pt states her breathing is about the same since last visit and also has complaints of a cough.     Kathleen BATOUL LIMES 62 y.o. -with rheumatoid arthritis.  Obese lady.  Presents with her daughter.  Since her last visit she continues to remain symptomatic with more shortness of breath on exertion [combination of ILD and also obesity and rheumatoid arthritis] and also severe joint pain.  She feels her respiratory status is stable.  She is not able to see her rheumatologist anymore because of refractoriness to treatment but she is in miserable pain.  We discussed the potential of starting tocilizumab.  However her QuantiFERON gold ended up being indeterminate x 2.  So we asked her primary care physician to do a PPD skin test.  She tells me that she just had this and was  negative.  She showed me her left forearm.  Was done last week.  I do not feel any induration.  Her quality of cath is limited by her pain which is quite bad and also her shortness of breath and obesity.  Her current symptom score is listed below.   OV 11/18/2022  Subjective:  Patient ID: Kathleen Kane, female , DOB: 05/25/1961 , age 23 y.o. , MRN: [id number] , ADDRESS: [address] [place] [address] PCP Fleet Contras, MD Patient Care Team: Fleet Contras, MD as PCP - General (Internal Medicine) Wendall Stade, MD as PCP -  Cardiology (Cardiology)  This Provider for this visit: Treatment Team:  Attending Provider: Kalman Shan, MD   11/18/2022 -   Chief Complaint  Patient presents with   Follow-up     Doing well.  Discuss PFT 10/17/2023.     Kathleen Kathleen Kane 62 y.o. -returns for follow-up.  At this visit just to see how she is doing after Tocilizumab start) show this has been started.  Review of the external records indicate that this is being started by the pharmacist.  She has had 1 dose so far.  Her daughter is administering the doses at home.  She is going to get weekly doses.  She continues to be in severe pain.  She rates her pain at 3.5/5.  She described the pain is debilitating and something that she would not wish on anyone.  She is cautiously optimistic that the tocilizumab would help.  Her respiratory status is stable.  She had pulmonary function test the DLCO stable but the FVC shows significant decline of 17% relative decline..  However his symptom scores are stable.  And the DLCO is also stable.   Most recent labs were from November 2023.    OV 03/01/2023  Subjective:  Patient ID: Kathleen Kane, female , DOB: 1960/11/23 , age 51 y.o. , MRN: [id number] , ADDRESS: [address] [place] [address] PCP Fleet Contras, MD Patient Care Team: Fleet Contras, MD as PCP - General (Internal Medicine) Wendall Stade, MD as PCP - Cardiology  (Cardiology)  This Provider for this visit: Treatment Team:  Attending Provider: Kalman Shan, MD   03/01/2023 -   Chief Complaint  Patient presents with   Follow-up    F/up from PFT done yesterday.     Kathleen LULAR LETSON 62 y.o. -returns for follow-up.  Presents with her daughter.  Daughter is an independent historian and filled out the symptom score sheet.  Daughter tells me that daughter herself has Sjogren's syndrome and is short of breath.  Daughter also has obesity.  I have asked him to make an appointment with me.  Regarding the patient she feels improved.  She feels more mobile.  She feels the pain is improved.  This because of the Actemra that I started in January 2024.  She continues on prednisone 10 mg/day.  I asked her to reduce it but she did not want to do it.  For she had pulmonary function test today the FVC is slightly better while the DLCO is down.  She is feeling stable.  We overall deemed her today is stable but indicated that she needs to have a CT scan and pulmonary function test in 3 months to see if she needs to go on additional medications especially approved antifibrotic nintedanib.  In addition I spoke to her about the ILD-Pro registry.  She is interested if she qualifies.    OV 05/23/2023  Subjective:  Patient ID: Kathleen Kane, female , DOB: 07-14-1961 , age 87 y.o. 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  - started actemra Jan 2024 (PPD neg per hx Dec 2023 - summer 2023 -indeterminate QuantiFERON gold]  - Normal G6PDH April 2024  Last Echo June 2023 - Last HRCT June 2023 -> July 2024  #Multiple cardiac issues sees Dr. Charlton Haws #Persistent atrial fibrillation #Chronic diastolic dysfunction #Mild mitral regurgitation by echocardiogram June 2023 #Poorly controlled diabetes mellitus #Morbid obesity.  Kathleen Kathleen Kane 62 y.o. -returns for follow-up.  Presents with her daughter.  Daughter is an independent historian.  Both attest there are no new medical issues.  Although she did end up in the ER April 16, 2023 for musculoskeletal back issue.  Urine analysis was abnormal.  She was given antibiotics and discharge.  Diagnosis was UTI.  She also had a URI visit 03/19/2023.  There is according to history and review of the external medical record.  From a respiratory standpoint she is stable.  She had pulmonary function test today shows continued stability in fact slight improvement.  Symptoms are stable.  [See below.].  She had CT scan of the chest and it shows pulmonary fibrosis also stable.  I did indicate to her that the combination of prednisone and Actemra is working well.  She also reports improvement in pain since starting Actemra.  She is little bit concerned about the diagnosis of hypersensitive pneumonitis reported by the radiologist.  We went over this with her and her daughter.  Daughter indicated that the house is damp and there might be some mold in the house.  I did not indicate that she would need an inspection and make sure there is no mold exposure and no feather pillows.  They are agreeable to this plan.   She has a BMI of greater than 40.  She states that she has poorly controlled diabetes.  Also confirmed on chart review.  She has had discussion about weight loss drugs but apparently they have not started this on her as yet.  She  does not know why.  I encouraged that weight loss would be a key component of her overall health Plan and encouraged her to talk to her primary care about this.     SYMPTOM SCALE - ILD 07/27/2022 Chronic pred 10mg  10/19/2022 Chronic pred 10mg  11/18/2022 Start actemira 03/01/2023 Actemra _ chronic pred 05/23/2023 Actemra + chronic pred  Current weight       O2 use ra ra ra ra ra  Shortness of Breath 0 -> 5 scale with 5 being worst (score 6 If unable to do)      At rest 1.1 1 1 1  0  Simple tasks - showers, clothes change, eating, shaving 2 3 2 2 2   Household (dishes, doing bed, laundry) x  x 2 Does not do  Shopping x  x 2 Does ot do  Walking level at own pace 4 4 3 3 4   Walking up Stairs 4 4 4 4 4   Total (30-36) Dyspnea Score 11 12 10 10 10   How bad is your cough? Hardly coughs anymore 2 mild 1 2  How bad is your fatigue No response 3 mod 2 3  How bad is nausea 0 0 0 0 0  How bad is vomiting?  0 0 0 0 0  How bad is diarrhea? 0 0 0 0 2  How bad is anxiety? xx 2 mild 0 1  How bad is depression xx 2 mild 0 1  Any chronic pain - if so where and how bad Unable to function because of severe knee pain and shoulder pain. Pain rated as 100 out of 5 4.5 3.5 3 4  of 5 and imroved        Simple office walk 185 feet x  3 laps goal with forehead probe 04/08/2022    O2 used ra   Number laps completed 2 of 3 laps   Comments about pace slow   Resting Pulse Ox/HR 100% and 80/min   Final Pulse Ox/HR 98% and 148/min   Desaturated </= 88% no   Desaturated <= 3% points no   Got Tachycardic >/= 90/min Yes, A Fib   Symptoms at end of test Fatigue, pain and dyspnea   Miscellaneous comments Was in  Aifb    CT Chest data - personally visualized and independently interpreted and my findings are: -   CT Chest High Resolution  Result Date: 05/22/2023 CLINICAL DATA:  62 year old female former smoker (quit 10 years ago) with history of interstitial lung disease. Difficulty breathing while lying down.  Follow-up study. EXAM: CT CHEST WITHOUT CONTRAST TECHNIQUE: Multidetector CT imaging of the chest was performed following the standard protocol without intravenous contrast. High resolution imaging of the lungs, as well as inspiratory and expiratory imaging, was performed. RADIATION DOSE REDUCTION: This exam was performed according to the departmental dose-optimization program which includes automated exposure control, adjustment of the mA and/or kV according to patient size and/or use of iterative reconstruction technique. COMPARISON:  Multiple priors, most recently chest CTA 09/29/2022. High-resolution chest CT 04/16/2022. FINDINGS: Cardiovascular: Heart size is enlarged with left atrial dilatation. There is no significant pericardial fluid, thickening or pericardial calcification. There is aortic atherosclerosis, as well as atherosclerosis of the great vessels of the mediastinum and the coronary arteries, including calcified atherosclerotic plaque in the left main, left anterior descending, left circumflex and right coronary arteries. 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 Mediastinum/Nodes: Multiple prominent borderline enlarged mediastinal and bilateral hilar lymph nodes, similar to prior examinations, presumably benign and reactive in the setting of chronic interstitial lung disease. Esophagus is unremarkable in appearance. No axillary lymphadenopathy. Lungs/Pleura: High-resolution images again demonstrate widespread but patchy areas of ground-glass attenuation, septal thickening, subpleural reticulation, thickening of the peribronchovascular interstitium, cylindrical bronchiectasis, peripheral bronchiolectasis and regional areas of architectural distortion scattered throughout the lungs bilaterally, with no discernible craniocaudal gradient. No frank honeycombing confidently identified. No substantial progression of disease compared to the prior study. Inspiratory and  expiratory imaging demonstrates extensive air trapping indicative of small airways disease. No acute consolidative airspace disease. No pleural effusions. No definite suspicious appearing pulmonary nodules or masses are noted. Upper Abdomen: Aortic atherosclerosis. Musculoskeletal: There are no aggressive appearing lytic or blastic lesions noted in the visualized portions of the skeleton. IMPRESSION: 1. The appearance of the lungs remains compatible with interstitial lung disease, once again considered most compatible with an alternative diagnosis (not usual interstitial pneumonia) per current ATS guidelines. 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 No substantial progression of disease compared to the prior study. 2. Cardiomegaly with left atrial dilatation. 3. Aortic atherosclerosis, in addition to left main and three-vessel coronary artery disease. Please note that although the presence of coronary artery calcium documents the presence of coronary artery disease, the severity of this disease and any potential stenosis cannot be assessed on this non-gated CT examination. Assessment for potential risk factor modification, dietary therapy  or pharmacologic therapy may be warranted, if clinically indicated. Aortic Atherosclerosis (ICD10-I70.0). Electronically Signed   By: Trudie Reed M.D.   On: 05/22/2023 13:42        PFT     Latest Ref Rng & Units 05/23/2023    9:02 AM 02/25/2023    9:11 AM 11/16/2022   11:59 AM 10/19/2022    8:52 AM 04/13/2022   12:52 PM  PFT Results  FVC-Pre L 2.05  P 1.90  1.81  1.94  2.18   FVC-Predicted Pre % 67  P 61  60  65  92   FVC-Post L     2.35   FVC-Predicted Post %     99   Pre FEV1/FVC % % 93  P 93  90  92  80   Post FEV1/FCV % %     91   FEV1-Pre L 1.90  P 1.77  1.62  1.79  1.75   FEV1-Predicted Pre % 81  P 75  70  78  95   FEV1-Post L     2.15   DLCO uncorrected ml/min/mmHg 10.92  P 8.48  10.13  9.79  10.11   DLCO UNC% %  57  P 44  54  52  54   DLCO corrected ml/min/mmHg 10.75  P 8.48  10.70  10.33  11.21   DLCO COR %Predicted % 56  P 44  57  55  60   DLVA Predicted % 76  P 99  80  73  79   TLC L     3.20   TLC % Predicted %     67   RV % Predicted %     -37     P Preliminary result   LAB RESULTS LAST 90 days  Recent Results (from the past 2160 hour(s))  Pulmonary function test     Status: None   Collection Time: 02/25/23  9:11 AM  Result Value Ref Range   FVC-Pre 1.90 L   FVC-%Pred-Pre 61 %   FEV1-Pre 1.77 L   FEV1-%Pred-Pre 75 %   FEV6-Pre 1.90 L   FEV6-%Pred-Pre 64 %   Pre FEV1/FVC ratio 93 %   FEV1FVC-%Pred-Pre 120 %   Pre FEV6/FVC Ratio 100 %   FEV6FVC-%Pred-Pre 103 %   FEF 25-75 Pre 3.43 L/sec   FEF2575-%Pred-Pre 159 %   DLCO unc 8.48 ml/min/mmHg   DLCO unc % pred 44 %   DLCO cor 8.48 ml/min/mmHg   DLCO cor % pred 44 %   DL/VA 8.65 ml/min/mmHg/L   DL/VA % pred 99 %  Glucose 6 phosphate dehydrogenase     Status: None   Collection Time: 03/01/23 10:21 AM  Result Value Ref Range   G-6PDH 15.3 7.0 - 20.5 U/g Hgb  Phosphorus     Status: None   Collection Time: 03/01/23 10:21 AM  Result Value Ref Range   Phosphorus 4.3 2.3 - 4.6 mg/dL  Magnesium     Status: None   Collection Time: 03/01/23 10:21 AM  Result Value Ref Range   Magnesium 1.7 1.5 - 2.5 mg/dL  B Nat Peptide     Status: Abnormal   Collection Time: 03/01/23 10:21 AM  Result Value Ref Range   Pro B Natriuretic peptide (BNP) 211.0 (H) 0.0 - 100.0 pg/mL  Hepatic function panel     Status: None   Collection Time: 03/01/23 10:21 AM  Result Value Ref Range   Total Bilirubin 0.9 0.2 - 1.2 mg/dL  Bilirubin, Direct 0.2 0.0 - 0.3 mg/dL   Alkaline Phosphatase 41 39 - 117 U/L   AST 12 0 - 37 U/L   ALT 11 0 - 35 U/L   Total Protein 6.5 6.0 - 8.3 g/dL   Albumin 4.2 3.5 - 5.2 g/dL  Basic Metabolic Panel (BMET)     Status: Abnormal   Collection Time: 03/01/23 10:21 AM  Result Value Ref Range   Sodium 141 135 - 145 mEq/L    Potassium 3.3 (L) 3.5 - 5.1 mEq/L   Chloride 101 96 - 112 mEq/L   CO2 28 19 - 32 mEq/L   Glucose, Bld 163 (H) 70 - 99 mg/dL   BUN 18 6 - 23 mg/dL   Creatinine, Ser 1.02 0.40 - 1.20 mg/dL   GFR 72.53 (L) >66.44 mL/min    Comment: Calculated using the CKD-EPI Creatinine Equation (2021)   Calcium 9.8 8.4 - 10.5 mg/dL  CBC w/Diff     Status: Abnormal   Collection Time: 03/01/23 10:21 AM  Result Value Ref Range   WBC 4.2 4.0 - 10.5 K/uL   RBC 4.51 3.87 - 5.11 Mil/uL   Hemoglobin 13.7 12.0 - 15.0 g/dL   HCT 03.4 74.2 - 59.5 %   MCV 90.0 78.0 - 100.0 fl   MCHC 33.7 30.0 - 36.0 g/dL   RDW 63.8 (H) 75.6 - 43.3 %   Platelets 209.0 150.0 - 400.0 K/uL   Neutrophils Relative % 43.9 43.0 - 77.0 %   Lymphocytes Relative 41.5 12.0 - 46.0 %   Monocytes Relative 9.0 3.0 - 12.0 %   Eosinophils Relative 4.8 0.0 - 5.0 %   Basophils Relative 0.8 0.0 - 3.0 %   Neutro Abs 1.8 1.4 - 7.7 K/uL   Lymphs Abs 1.7 0.7 - 4.0 K/uL   Monocytes Absolute 0.4 0.1 - 1.0 K/uL   Eosinophils Absolute 0.2 0.0 - 0.7 K/uL   Basophils Absolute 0.0 0.0 - 0.1 K/uL  Lipase, blood     Status: Abnormal   Collection Time: 04/16/23 12:08 PM  Result Value Ref Range   Lipase 54 (H) 11 - 51 U/L    Comment: Performed at [facility], [address] [place], [place] [address]  Comprehensive metabolic panel     Status: Abnormal   Collection Time: 04/16/23 12:08 PM  Result Value Ref Range   Sodium 141 135 - 145 mmol/L   Potassium 3.6 3.5 - 5.1 mmol/L   Chloride 108 98 - 111 mmol/L   CO2 21 (L) 22 - 32 mmol/L   Glucose, Bld 76 70 - 99 mg/dL    Comment: Glucose reference range applies only to samples taken after fasting for at least 8 hours.   BUN 27 (H) 8 - 23 mg/dL   Creatinine, Ser 8.41 0.44 - 1.00 mg/dL   Calcium 9.4 8.9 - 66.0 mg/dL   Total Protein 7.0 6.5 - 8.1 g/dL   Albumin 3.8 3.5 - 5.0 g/dL   AST 19 15 - 41 U/L   ALT 31 0 - 44 U/L   Alkaline Phosphatase 44 38 - 126 U/L   Total Bilirubin 0.8 0.3  - 1.2 mg/dL   GFR, Estimated >63 >01 mL/min    Comment: (NOTE) Calculated using the CKD-EPI Creatinine Equation (2021)    Anion gap 12 5 - 15    Comment: Performed at [facility], [address] [place], [place] [address]  CBC     Status: None   Collection  Time: 04/16/23 12:08 PM  Result Value Ref Range   WBC 5.4 4.0 - 10.5 K/uL   RBC 4.44 3.87 - 5.11 MIL/uL   Hemoglobin 13.9 12.0 - 15.0 g/dL   HCT 24.4 01.0 - 27.2 %   MCV 95.9 80.0 - 100.0 fL   MCH 31.3 26.0 - 34.0 pg   MCHC 32.6 30.0 - 36.0 g/dL   RDW 53.6 64.4 - 03.4 %   Platelets 195 150 - 400 K/uL   nRBC 0.0 0.0 - 0.2 %    Comment: Performed at [facility], [address] [place], [place] [address]  Urinalysis, Routine w reflex microscopic -Urine, Clean Catch     Status: Abnormal   Collection Time: 04/16/23  1:46 PM  Result Value Ref Range   Color, Urine YELLOW YELLOW   APPearance CLEAR CLEAR   Specific Gravity, Urine 1.014 1.005 - 1.030   pH 5.0 5.0 - 8.0   Glucose, UA NEGATIVE NEGATIVE mg/dL   Hgb urine dipstick MODERATE (A) NEGATIVE   Bilirubin Urine NEGATIVE NEGATIVE   Ketones, ur NEGATIVE NEGATIVE mg/dL   Protein, ur NEGATIVE NEGATIVE mg/dL   Nitrite NEGATIVE NEGATIVE   Leukocytes,Ua SMALL (A) NEGATIVE   RBC / HPF 11-20 0 - 5 RBC/hpf   WBC, UA 0-5 0 - 5 WBC/hpf   Bacteria, UA FEW (A) NONE SEEN   Squamous Epithelial / HPF 0-5 0 - 5 /HPF   Mucus PRESENT     Comment: Performed at [facility], [address] [place], [place] [address]  Urine Culture     Status: None   Collection Time: 05/04/23  4:30 PM  Result Value Ref Range   MICRO NUMBER: [id number]    SPECIMEN QUALITY: Adequate    Sample Source BLADDER    STATUS: FINAL    Result: No Growth   Pulmonary function test     Status: None (Preliminary result)   Collection Time: 05/23/23  9:02 AM  Result Value Ref Range   FVC-Pre 2.05 L   FVC-%Pred-Pre 67 %   FEV1-Pre 1.90 L   FEV1-%Pred-Pre 81 %    FEV6-Pre 2.05 L   FEV6-%Pred-Pre 70 %   Pre FEV1/FVC ratio 93 %   FEV1FVC-%Pred-Pre 119 %   Pre FEV6/FVC Ratio 100 %   FEV6FVC-%Pred-Pre 104 %   FEF 25-75 Pre 3.76 L/sec   FEF2575-%Pred-Pre 178 %   DLCO unc 10.92 ml/min/mmHg   DLCO unc % pred 57 %   DLCO cor 10.75 ml/min/mmHg   DLCO cor % pred 56 %   DL/VA 1.88 ml/min/mmHg/L   DL/VA % pred 76 %        has a past medical history of Acute on chronic diastolic CHF (congestive heart failure) (HCC) (08/08/2015), Anxiety, Atrial fibrillation, persistent (HCC), Carpal tunnel syndrome, right (12/15/2016), CHF (congestive heart failure) (HCC) (2013), CHF exacerbation (HCC) (10/01/2018), Depression, Diabetes mellitus (HCC) (08/10/2012), Diabetes mellitus without complication (HCC), Hypertension, 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   reports that she quit smoking about 8 years ago. Her smoking use included cigarettes. She started smoking about 46 years ago. She has a 37.6 pack-year smoking history. She has never been exposed to tobacco smoke. She has never used smokeless tobacco.  Past Surgical History:  Procedure Laterality Date  CARDIOVASCULAR STRESS TEST  02/16/2013   no significant EKG changes with Lexiscan, normal LV function and normal wall function   CESAREAN SECTION     x2   DOPPLER ECHOCARDIOGRAPHY  01/24/2008   EF 55-60%, no diagnostic evidence of LV wall motion abnormalities, LV wall thickness was mild-moderately increased.   HAND SURGERY Right 2018   LAPAROTOMY Right 02/27/2013   Procedure: EXPLORATORY LAPAROTOMY, right salpingo-oopherectomy;  Surgeon: Laurette Schimke, MD;  Location: [facility];  Service:  Gynecology;  Laterality: Right;   left breast cyst     SALPINGOOPHORECTOMY Right 02/27/2013   Procedure: SALPINGO OOPHORECTOMY;  Surgeon: Laurette Schimke, MD;  Location: [facility];  Service: Gynecology;  Laterality: Right;   TUBAL LIGATION Bilateral 1989    Allergies  Allergen Reactions   Ace Inhibitors Cough   Lisinopril Cough     There is no immunization history on file for this patient.  Family History  Problem Relation Age of Onset   Hypertension Mother    Diabetes Mother    Throat cancer Mother    Hypertension Father    Diabetes Sister    Heart Problems Sister    Kidney failure Sister    Hypertension Brother    Diabetes Brother    Diabetes Paternal Grandmother    Autoimmune disease Daughter    Healthy Son      Current Outpatient Medications:    Accu-Chek FastClix Lancets MISC, Apply topically., Disp: , Rfl:    ACCU-CHEK GUIDE test strip, , Disp: , Rfl:    apixaban (ELIQUIS) 5 MG TABS tablet, TAKE 1 TABLET BY MOUTH TWICE A DAY, Disp: 60 tablet, Rfl: 10   atorvastatin (LIPITOR) 20 MG tablet, TAKE 1 TABLET (20 MG TOTAL) BY MOUTH DAILY. PLEASE SCHEDULE YEARLY APPOINTMENT FOR FUTURE REFILLS. 1ST ATTEMPT. THANK YOU, Disp: 30 tablet, Rfl: 2   B-D ULTRAFINE III SHORT PEN 31G X 8 MM MISC, Inject into the skin 2 (two) times daily., Disp: , Rfl:    blood glucose meter kit and supplies, Dispense based on patient and insurance preference. Use up to four times daily as directed. (FOR ICD-10 E10.9, E11.9)., Disp: 1 each, Rfl: 0   diazepam (VALIUM) 5 MG tablet, Take 1 tablet (5 mg total) by mouth every 6 (six) hours as needed for muscle spasms., Disp: 15 tablet, Rfl: 0   ferrous sulfate 325 (65 FE) MG tablet, Take 1 tablet (325 mg total) by mouth daily with breakfast., Disp: 30 tablet, Rfl: 3   folic acid (FOLVITE) 1 MG tablet, Take 1 mg by mouth every morning., Disp: , Rfl: 3   furosemide (LASIX) 40 MG tablet, Take 1 tablet (40 mg total) by mouth daily., Disp: 45 tablet, Rfl: 0    levalbuterol (XOPENEX HFA) 45 MCG/ACT inhaler, Inhale 1-2 puffs into the lungs every 6 (six) hours as needed for wheezing., Disp: 1 each, Rfl: 0   losartan (COZAAR) 25 MG tablet, TAKE 1 TABLET BY MOUTH EVERY DAY, Disp: 90 tablet, Rfl: 3   metFORMIN (GLUCOPHAGE) 500 MG tablet, Take 2 tablets (1,000 mg total) by mouth 2 (two) times daily., Disp: 60 tablet, Rfl: 0   metolazone (ZAROXOLYN) 5 MG tablet, Take 1 tablet (5 mg total) by mouth as needed (edema). Take 30 minutes prior to taking Furosemide ( lasix ), Disp: 45 tablet, Rfl: 3   metoprolol tartrate (LOPRESSOR) 25 MG tablet, Take 1 tablet (25 mg total) by mouth 2 (two) times daily., Disp: 180 tablet, Rfl: 3   NOVOLOG MIX 70/30 FLEXPEN (70-30) 100  UNIT/ML FlexPen, Inject 0.4 mLs (40 Units total) into the skin 2 (two) times daily with a meal., Disp: 15 mL, Rfl: 0   potassium chloride (KLOR-CON M) 10 MEQ tablet, Take 1 tablet (10 mEq total) by mouth 2 (two) times daily., Disp: 180 tablet, Rfl: 3   predniSONE (DELTASONE) 10 MG tablet, Take 1 tablet (10 mg total) by mouth daily., Disp: 30 tablet, Rfl: 0   Tocilizumab (ACTEMRA ACTPEN) 162 MG/0.9ML SOAJ, Inject 162 mg into the skin every 7 (seven) days., Disp: 3.6 mL, Rfl: 1   oxyCODONE-acetaminophen (PERCOCET/ROXICET) 5-325 MG tablet, Take 1 tablet by mouth every 6 (six) hours as needed for severe pain. (Patient not taking: Reported on 05/23/2023), Disp: 15 tablet, Rfl: 0   spironolactone (ALDACTONE) 25 MG tablet, Take 0.5 tablets (12.5 mg total) by mouth daily. (Patient not taking: Reported on 05/23/2023), Disp: 45 tablet, Rfl: 3      Objective:   Vitals:   05/23/23 0939  BP: (!) 140/90  Pulse: 83  SpO2: 96%  Weight: 238 lb (108 kg)  Height: 5' 2.75" (1.594 m)    Estimated body mass index is 42.5 kg/m as calculated from the following:   Height as of this encounter: 5' 2.75" (1.594 m).   Weight as of this encounter: 238 lb (108 kg).  @WEIGHTCHANGE @  [employer]   05/23/23 0939  Weight:  238 lb (108 kg)     Physical Exam   General: No distress.  O2 at rest: no Cane present: no Sitting in wheel chair: YES Frail: no Obese: YES Neuro: Alert and Oriented x 3. GCS 15. Speech normal Psych: Pleasant Resp:  Barrel Chest - no.  Wheeze - no, Crackles - YES BSE, No overt respiratory distress CVS: Normal heart sounds. Murmurs - n Ext: Stigmata of Connective Tissue Disease - RA HEENT: Normal upper airway. PEERL +. No post nasal drip        Assessment:       ICD-10-CM   1. Interstitial lung disease due to connective tissue disease (HCC)  J84.89    M35.9     2. Rheumatoid arthritis, 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HCC)  M05.9     3. High risk medication use  Z79.899     4. Current chronic use of systemic steroids  Z79.52     5. Immunosuppressed status (HCC)  D84.9     6. Mold suspected exposure  Z77.120     7. Morbid obesity (HCC)  E66.01          Plan:     Patient Instructions     ICD-10-CM   1. Interstitial lung disease due to connective tissue disease (HCC)  J84.89    M35.9     2. Rheumatoid arthritis, 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HCC)  M05.9     3. High risk medication use  Z79.899     4. Current chronic use of systemic steroids  Z79.52     5. Immunosuppressed status (HCC)  D84.9     6. Mold suspected exposure  Z77.120     7. Morbid obesity (HCC)  E66.01       RA pain improved with Actemra since jan 2024 and  daily prednisone 10 mg/day. Pulmonary fibrosis itself is stable in the last 1 year especially after starting Actemra. Noted that you might have some suspected mold exposure at home because of dampness Highly encourage weight loss through medication management  Plan -Continue biologic Actemra weekly injection given benefit -Continue prednisone 10 mg/day; at a future visit can look at  reducing it -Start Bactrim 1 double strength tablet Monday Wednesday Friday to prevent opportunistic infections -= check cbc, bmet, LFT, 05/23/2023  for safety monitoriung  -Any side  effect such as rash please let us know -Do spirometry and DLCO in 16 weeks -20 weeks -Talk to primary care doctor about weight loss drugs suggest Ozempic etc.,    Follow-up - Return in 16-20 weeks to see Dr Marchelle Gearing -30 min slot but after PFT   -Symptom score and walk test at follow-up   ( Level 05 visit E&M 2024: Estb >= 40 min in  visit type: on-site physical face to visit  in total care time and counseling or/and coordination of care by this undersigned MD - Dr Kalman Shan. This includes one or more of the following on this same day 05/23/2023: pre-charting, chart review, note writing, documentation discussion of test results, diagnostic or treatment recommendations, prognosis, risks and benefits of management options, instructions, education, compliance or risk-factor reduction. It excludes time spent by the CMA or office staff in the care of the patient. Actual time 42 min)    SIGNATURE    Dr. Kalman Shan, M.D., F.C.C.P,  Pulmonary and Critical Care Medicine Staff Physician, [facility] Director - Interstitial Lung Disease  Program  Pulmonary Fibrosis [facility] at [facility] [place], [place], [address]  Pager: [phone], If no answer or between  15:00h - 7:00h: call [phone] Telephone: [phone]  10:36 AM 05/23/2023

## 2023-05-23 NOTE — Progress Notes (Signed)
Spirometry and DLCO completed.

## 2023-05-24 ENCOUNTER — Other Ambulatory Visit (HOSPITAL_COMMUNITY): Payer: Self-pay

## 2023-05-24 ENCOUNTER — Other Ambulatory Visit: Payer: Self-pay

## 2023-05-24 LAB — COMPREHENSIVE METABOLIC PANEL
ALT: 18 U/L (ref 0–35)
AST: 14 U/L (ref 0–37)
Albumin: 4.3 g/dL (ref 3.5–5.2)
Alkaline Phosphatase: 50 U/L (ref 39–117)
BUN: 47 mg/dL — ABNORMAL HIGH (ref 6–23)
CO2: 24 mEq/L (ref 19–32)
Calcium: 10.5 mg/dL (ref 8.4–10.5)
Chloride: 99 mEq/L (ref 96–112)
Creatinine, Ser: 1.32 mg/dL — ABNORMAL HIGH (ref 0.40–1.20)
GFR: 42.76 mL/min — ABNORMAL LOW (ref >60.00)
Glucose, Bld: 165 mg/dL — ABNORMAL HIGH (ref 70–99)
Potassium: 3.5 mEq/L (ref 3.5–5.1)
Sodium: 138 mEq/L (ref 135–145)
Total Bilirubin: 0.6 mg/dL (ref 0.2–1.2)
Total Protein: 6.9 g/dL (ref 6.0–8.3)

## 2023-05-24 LAB — HEPATIC FUNCTION PANEL
ALT: 18 U/L (ref 0–35)
AST: 14 U/L (ref 0–37)
Albumin: 4.3 g/dL (ref 3.5–5.2)
Alkaline Phosphatase: 50 U/L (ref 39–117)
Bilirubin, Direct: 0.2 mg/dL (ref 0.0–0.3)
Total Bilirubin: 0.6 mg/dL (ref 0.2–1.2)
Total Protein: 6.9 g/dL (ref 6.0–8.3)

## 2023-05-24 LAB — BASIC METABOLIC PANEL
BUN: 47 mg/dL — ABNORMAL HIGH (ref 6–23)
CO2: 24 mEq/L (ref 19–32)
Calcium: 10.5 mg/dL (ref 8.4–10.5)
Chloride: 99 mEq/L (ref 96–112)
Creatinine, Ser: 1.32 mg/dL — ABNORMAL HIGH (ref 0.40–1.20)
GFR: 42.76 mL/min — ABNORMAL LOW (ref >60.00)
Glucose, Bld: 165 mg/dL — ABNORMAL HIGH (ref 70–99)
Potassium: 3.5 mEq/L (ref 3.5–5.1)
Sodium: 138 mEq/L (ref 135–145)

## 2023-05-24 MED ORDER — ACTEMRA ACTPEN 162 MG/0.9ML ~~LOC~~ SOAJ
162.0000 mg | SUBCUTANEOUS | 2 refills | Status: DC
Start: 2023-05-24 — End: 2023-08-17
  Filled 2023-05-24: qty 3.6, 28d supply, fill #0
  Filled 2023-06-23: qty 3.6, 28d supply, fill #1
  Filled 2023-07-20: qty 3.6, 28d supply, fill #2

## 2023-05-24 NOTE — Telephone Encounter (Signed)
Refill sent for ACTEMRA to [facility]: [phone]   Dose: 162 mg SQ weekly  Last OV: 05/23/23 Provider: Dr. Marchelle Gearing  Next OV: due in Nov 2024  Labs - CBC, CMP on 05/24/23 stable  Chesley Mires, PharmD, MPH, BCPS Clinical Pharmacist (Rheumatology and Pulmonology)

## 2023-05-26 ENCOUNTER — Other Ambulatory Visit (HOSPITAL_COMMUNITY): Payer: Self-pay

## 2023-05-27 ENCOUNTER — Other Ambulatory Visit (HOSPITAL_COMMUNITY): Payer: Self-pay

## 2023-05-30 ENCOUNTER — Other Ambulatory Visit (HOSPITAL_COMMUNITY): Payer: Self-pay

## 2023-05-30 ENCOUNTER — Other Ambulatory Visit: Payer: Self-pay

## 2023-06-02 ENCOUNTER — Other Ambulatory Visit (HOSPITAL_COMMUNITY): Payer: Self-pay

## 2023-06-08 ENCOUNTER — Other Ambulatory Visit: Payer: Self-pay | Admitting: Cardiovascular Disease

## 2023-06-08 DIAGNOSIS — I4892 Unspecified atrial flutter: Secondary | ICD-10-CM

## 2023-06-13 ENCOUNTER — Other Ambulatory Visit: Payer: Self-pay | Admitting: Cardiovascular Disease

## 2023-06-13 DIAGNOSIS — I4811 Longstanding persistent atrial fibrillation: Secondary | ICD-10-CM

## 2023-06-13 NOTE — Telephone Encounter (Signed)
Prescription refill request for Eliquis received. Indication:afib Last office visit:2/24 Scr:1.32  7/24 Age:62  Weight:108  kg  Prescription refilled

## 2023-06-17 ENCOUNTER — Other Ambulatory Visit (HOSPITAL_COMMUNITY): Payer: Self-pay

## 2023-06-20 ENCOUNTER — Other Ambulatory Visit (HOSPITAL_COMMUNITY): Payer: Self-pay

## 2023-06-22 ENCOUNTER — Other Ambulatory Visit (HOSPITAL_COMMUNITY): Payer: Self-pay

## 2023-06-23 ENCOUNTER — Other Ambulatory Visit (HOSPITAL_COMMUNITY): Payer: Self-pay

## 2023-06-23 ENCOUNTER — Other Ambulatory Visit: Payer: Self-pay

## 2023-06-25 ENCOUNTER — Encounter (HOSPITAL_COMMUNITY): Payer: Self-pay

## 2023-06-25 ENCOUNTER — Emergency Department (HOSPITAL_COMMUNITY)
Admission: EM | Admit: 2023-06-25 | Discharge: 2023-06-25 | Disposition: A | Discharge status: Home / Self Care | Payer: Medicaid Other | Attending: Emergency Medicine | Admitting: Emergency Medicine

## 2023-06-25 ENCOUNTER — Emergency Department (HOSPITAL_COMMUNITY): Payer: Medicaid Other

## 2023-06-25 DIAGNOSIS — I5033 Acute on chronic diastolic (congestive) heart failure: Secondary | ICD-10-CM | POA: Diagnosis not present

## 2023-06-25 DIAGNOSIS — E119 Type 2 diabetes mellitus without complications: Secondary | ICD-10-CM | POA: Diagnosis not present

## 2023-06-25 DIAGNOSIS — Z20822 Contact with and (suspected) exposure to covid-19: Secondary | ICD-10-CM | POA: Insufficient documentation

## 2023-06-25 DIAGNOSIS — I110 Hypertensive heart disease with heart failure: Secondary | ICD-10-CM | POA: Insufficient documentation

## 2023-06-25 DIAGNOSIS — R4182 Altered mental status, unspecified: Secondary | ICD-10-CM | POA: Insufficient documentation

## 2023-06-25 DIAGNOSIS — J40 Bronchitis, not specified as acute or chronic: Secondary | ICD-10-CM | POA: Diagnosis not present

## 2023-06-25 DIAGNOSIS — R0602 Shortness of breath: Secondary | ICD-10-CM | POA: Diagnosis present

## 2023-06-25 LAB — COMPREHENSIVE METABOLIC PANEL
ALT: 29 U/L (ref 0–44)
AST: 25 U/L (ref 15–41)
Albumin: 3.9 g/dL (ref 3.5–5.0)
Alkaline Phosphatase: 34 U/L — ABNORMAL LOW (ref 38–126)
Anion gap: 11 (ref 5–15)
BUN: 12 mg/dL (ref 8–23)
CO2: 21 mmol/L — ABNORMAL LOW (ref 22–32)
Calcium: 9 mg/dL (ref 8.9–10.3)
Chloride: 105 mmol/L (ref 98–111)
Creatinine, Ser: 1.05 mg/dL — ABNORMAL HIGH (ref 0.44–1.00)
GFR, Estimated: 59 mL/min — ABNORMAL LOW (ref >60)
Glucose, Bld: 270 mg/dL — ABNORMAL HIGH (ref 70–99)
Potassium: 3.5 mmol/L (ref 3.5–5.1)
Sodium: 137 mmol/L (ref 135–145)
Total Bilirubin: 0.7 mg/dL (ref 0.3–1.2)
Total Protein: 6.3 g/dL — ABNORMAL LOW (ref 6.5–8.1)

## 2023-06-25 LAB — URINALYSIS, ROUTINE W REFLEX MICROSCOPIC
Bacteria, UA: NONE SEEN
Bilirubin Urine: NEGATIVE
Glucose, UA: NEGATIVE mg/dL
Ketones, ur: NEGATIVE mg/dL
Leukocytes,Ua: NEGATIVE
Nitrite: NEGATIVE
Protein, ur: NEGATIVE mg/dL
Specific Gravity, Urine: 1.006 (ref 1.005–1.030)
pH: 5 (ref 5.0–8.0)

## 2023-06-25 LAB — TROPONIN I (HIGH SENSITIVITY)
Troponin I (High Sensitivity): 28 ng/L — ABNORMAL HIGH (ref <18)
Troponin I (High Sensitivity): 22 ng/L — ABNORMAL HIGH (ref <18)

## 2023-06-25 LAB — CBC WITH DIFFERENTIAL/PLATELET
Abs Immature Granulocytes: 0.01 10*3/uL (ref 0.00–0.07)
Basophils Absolute: 0 10*3/uL (ref 0.0–0.1)
Basophils Relative: 1 %
Eosinophils Absolute: 0.1 10*3/uL (ref 0.0–0.5)
Eosinophils Relative: 4 %
HCT: 36.1 % (ref 36.0–46.0)
Hemoglobin: 12.1 g/dL (ref 12.0–15.0)
Immature Granulocytes: 0 %
Lymphocytes Relative: 38 %
Lymphs Abs: 1.5 10*3/uL (ref 0.7–4.0)
MCH: 31.2 pg (ref 26.0–34.0)
MCHC: 33.5 g/dL (ref 30.0–36.0)
MCV: 93 fL (ref 80.0–100.0)
Monocytes Absolute: 0.5 10*3/uL (ref 0.1–1.0)
Monocytes Relative: 13 %
Neutro Abs: 1.8 10*3/uL (ref 1.7–7.7)
Neutrophils Relative %: 44 %
Platelets: 163 10*3/uL (ref 150–400)
RBC: 3.88 MIL/uL (ref 3.87–5.11)
RDW: 13.6 % (ref 11.5–15.5)
WBC: 3.9 10*3/uL — ABNORMAL LOW (ref 4.0–10.5)
nRBC: 0 % (ref 0.0–0.2)

## 2023-06-25 LAB — ETHANOL: Alcohol, Ethyl (B): <10 mg/dL (ref <10)

## 2023-06-25 LAB — RAPID URINE DRUG SCREEN, HOSP PERFORMED
Amphetamines: NOT DETECTED
Barbiturates: NOT DETECTED
Benzodiazepines: NOT DETECTED
Cocaine: NOT DETECTED
Opiates: NOT DETECTED
Tetrahydrocannabinol: NOT DETECTED

## 2023-06-25 LAB — BLOOD GAS, VENOUS
Acid-base deficit: 1.5 mmol/L (ref 0.0–2.0)
Bicarbonate: 23.7 mmol/L (ref 20.0–28.0)
O2 Saturation: 80.8 %
Patient temperature: 37
pCO2, Ven: 41 mmHg — ABNORMAL LOW (ref 44–60)
pH, Ven: 7.37 (ref 7.25–7.43)
pO2, Ven: 50 mmHg — ABNORMAL HIGH (ref 32–45)

## 2023-06-25 LAB — LIPASE, BLOOD: Lipase: 26 U/L (ref 11–51)

## 2023-06-25 LAB — SARS CORONAVIRUS 2 BY RT PCR: SARS Coronavirus 2 by RT PCR: NEGATIVE

## 2023-06-25 LAB — AMMONIA: Ammonia: 15 umol/L (ref 9–35)

## 2023-06-25 LAB — CBG MONITORING, ED: Glucose-Capillary: 252 mg/dL — ABNORMAL HIGH (ref 70–99)

## 2023-06-25 MED ORDER — IPRATROPIUM-ALBUTEROL 0.5-2.5 (3) MG/3ML IN SOLN
3.0000 mL | Freq: Once | RESPIRATORY_TRACT | Status: AC
  Administered 2023-06-25: 3 mL via RESPIRATORY_TRACT
  Filled 2023-06-25: qty 3

## 2023-06-25 MED ORDER — SODIUM CHLORIDE 0.9 % IV BOLUS
500.0000 mL | Freq: Once | INTRAVENOUS | Status: AC
  Administered 2023-06-25: 500 mL via INTRAVENOUS

## 2023-06-25 NOTE — ED Triage Notes (Signed)
Pt arrived via POV, brought in by spouse. Per spouse pt was not acting to baseline today. Was brought in for evaluation. Pt appeared in and out of consciousness in triage, VSS. Hard to wake in triage, but able to answer questions once awake. Pt states she has been very dizzy recently.

## 2023-06-25 NOTE — ED Provider Notes (Signed)
Emergency Department Provider Note   I have reviewed the triage vital signs and the nursing notes.   HISTORY  Chief Complaint Altered Mental Status   HPI Kathleen Kane is a 62 y.o. female with past history of CHF, A-fib, diabetes presents to the emergency department for evaluation of acute onset generalized weakness and lightheadedness.  Denies fevers.  She awoke this morning feeling well.  She had had some increased shortness of breath over the past couple of days and did take a dose of her steroid this morning.  She was able to run errands but during this became very weak all over and lightheaded.  She was out with her husband who brought her to the emergency department by private vehicle.  She was found in the car less responsive and very sleepy.  Patient denies any drugs or alcohol.  In June she had a prescription for Percocet and Valium but states she is not taking this medication anymore. Denies CP. No severe HA.   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 AHI-6.8/hr, during REM-27.3/hr. RDI-25.0/hr, during REM-40.9/hr. avg o2 sat during REM and NREM 95%   OSA (obstructive sleep apnea) 02/10/2017   Pelvic mass in female 01/29/2013   With ascites    Pneumonia 10/02/2018   Rheumatoid arthritis (HCC)    Rheumatoid arthritis, 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HCC)  10/08/2015    Review of Systems  Level 5 caveat: AMS  ____________________________________________   PHYSICAL EXAM:  VITAL SIGNS: ED Triage Vitals [06/25/23 1158]  Encounter Vitals Group     BP (!) 152/85     Pulse Rate 60     Resp 18     Temp 98.3 F (36.8 C)     Temp Source Axillary     SpO2 98 %   Constitutional: Drowsy but arouses to touch and provides a brief history. Will go back to sleep if not engaging in conversation.  Eyes: Conjunctivae are normal.  Head: Atraumatic. Nose: No congestion/rhinnorhea. Mouth/Throat: Mucous membranes are moist. Neck: No stridor.   Cardiovascular: Normal rate, regular rhythm. Good peripheral circulation. Grossly normal heart sounds.   Respiratory: Normal respiratory effort.  No retractions. Lungs CTAB. Gastrointestinal: Soft and nontender. No distention.  Musculoskeletal: No lower extremity tenderness nor edema. No gross deformities of extremities. Neurologic: Speech is slightly slurred. 4+/5 strength throughout. No numbness. No face droop.  Skin:  Skin is warm, dry and intact. No rash noted.   ____________________________________________   LABS (all labs ordered are listed, but only abnormal results are displayed)  Labs Reviewed  URINALYSIS, ROUTINE W REFLEX MICROSCOPIC - Abnormal; Notable for the following components:      Result Value   Color, Urine STRAW (*)    Hgb urine dipstick SMALL (*)    All other components within normal limits  COMPREHENSIVE METABOLIC PANEL - Abnormal;  Notable for the following components:   CO2 21 (*)    Glucose, Bld 270 (*)    Creatinine, Ser 1.05 (*)    Total Protein 6.3 (*)    Alkaline Phosphatase 34 (*)    GFR, Estimated 59 (*)    All other components within normal limits  CBC WITH DIFFERENTIAL/PLATELET - Abnormal; Notable for the following components:   WBC 3.9 (*)    All other components within normal limits  BLOOD GAS, VENOUS - Abnormal; Notable for the following components:   pCO2,  Ven 41 (*)    pO2, Ven 50 (*)    All other components within normal limits  CBG MONITORING, ED - Abnormal; Notable for the following components:   Glucose-Capillary 252 (*)    All other components within normal limits  TROPONIN I (HIGH SENSITIVITY) - Abnormal; Notable for the following components:   Troponin I (High Sensitivity) 28 (*)    All other components within normal limits  TROPONIN I (HIGH SENSITIVITY) - Abnormal; Notable for the following components:   Troponin I (High Sensitivity) 22 (*)    All other components within normal limits  SARS CORONAVIRUS 2 BY RT PCR  ETHANOL  LIPASE, BLOOD  RAPID URINE DRUG SCREEN, HOSP PERFORMED  AMMONIA   ____________________________________________  EKG   EKG Interpretation Date/Time:  Saturday June 25 2023 12:23:40 EDT Ventricular Rate:  62 PR Interval:    QRS Duration:  143 QT Interval:  498 QTC Calculation: 506 R Axis:   -13  Text Interpretation: Atrial fibrillation Right bundle branch block Confirmed by Alona Bene [phone]) on 06/25/2023 12:49:35 PM       ____________________________________________   PROCEDURES  Procedure(s) performed:   Procedures  None  ____________________________________________   INITIAL IMPRESSION / ASSESSMENT AND PLAN / ED COURSE  Pertinent labs & imaging results that were available during my care of the patient were reviewed by me and considered in my medical decision making (see chart for details).   This patient is Presenting for Evaluation of AMS, which does require a range of treatment options, and is a complaint that involves a high risk of morbidity and mortality.  The Differential Diagnoses includes but is not exclusive to alcohol, illicit or prescription medications, intracranial pathology such as stroke, intracerebral hemorrhage, fever or infectious causes including sepsis, hypoxemia, uremia, trauma, endocrine related disorders such as diabetes, hypoglycemia, thyroid-related  diseases, etc.   Critical Interventions-    Medications  sodium chloride 0.9 % bolus 500 mL (0 mLs Intravenous Stopped 06/25/23 1437)  ipratropium-albuterol (DUONEB) 0.5-2.5 (3) MG/3ML nebulizer solution 3 mL (3 mLs Nebulization Given 06/25/23 1437)    Reassessment after intervention: symptoms improved. Patient more alert, sitting up, and talking.    I did obtain Additional Historical Information from husband at bedside.    Clinical Laboratory Tests Ordered, included troponin mildly elevated. COVID negative. UDS negative. No anemia. No AKI.   Radiologic Tests Ordered, included CT head and CXR. I independently interpreted the images and agree with radiology interpretation.   Cardiac Monitor Tracing which shows NSR.    Social Determinants of Health Risk patient is not an active smoker and denies EtOH or drug use.   Medical Decision Making: Summary:  Patient presents emergency department after suddenly becoming dizzy and less responsive.  Question syncope episode in the car versus somnolence.  Has been does not suspect drug use and patient denies this.  Vital signs are within normal limits.   Reevaluation with update and discussion with patient and  husband at bedside.  Overall workup is reassuring.  We will get a second troponin with her first 1 being mildly elevated.  Does have history of this in the past.  No acute EKG changes.  No chest pain.  Care transferred to Dr. Rodena Medin pending repeat labs.  Considered admission and discussed this with the patient who is feeling much better. She declines admit.   Patient's presentation is most consistent with acute presentation with potential threat to life or bodily function.   Disposition: discharge  ____________________________________________  FINAL CLINICAL IMPRESSION(S) / ED DIAGNOSES  Final diagnoses:  Altered mental status, unspecified altered mental status type  Bronchitis    Note:  This document was prepared using Dragon voice  recognition software and may include unintentional dictation errors.  Alona Bene, MD, [facility]    Chinedu Agustin, Arlyss Repress, MD 06/27/23 [phone]

## 2023-06-25 NOTE — Discharge Instructions (Addendum)
You were seen in the emerged from today with sleepiness and some shortness of breath.  Your x-ray shows that you may have a viral infection, possibly bronchitis.  You should continue your home medications including your inhalers.  Your lab work is otherwise reassuring.  Please call your primary care doctor Monday to schedule close follow-up appointment the coming week.  Return to the emergency department any new or suddenly worsening symptoms.

## 2023-06-25 NOTE — ED Notes (Signed)
Pt completed neb tx, resting and comfortable

## 2023-06-25 NOTE — ED Notes (Signed)
Patient transported to CT 

## 2023-06-25 NOTE — ED Provider Notes (Signed)
Patient seen after prior ED provider.  Patient remains comfortable.  She is fully at her baseline per family members at bedside.  Patient desires discharge.  Workup in the ED did not demonstrate evidence of significant acute pathology.  Importance of close follow-up with PCP is stressed.  Strict return precautions given and understood.   Wynetta Fines, MD 06/25/23 1534

## 2023-06-27 ENCOUNTER — Other Ambulatory Visit (HOSPITAL_COMMUNITY): Payer: Self-pay

## 2023-06-28 ENCOUNTER — Other Ambulatory Visit (HOSPITAL_COMMUNITY): Payer: Self-pay

## 2023-07-13 ENCOUNTER — Other Ambulatory Visit: Payer: Self-pay

## 2023-07-15 ENCOUNTER — Other Ambulatory Visit (HOSPITAL_COMMUNITY): Payer: Self-pay

## 2023-07-18 ENCOUNTER — Other Ambulatory Visit (HOSPITAL_COMMUNITY): Payer: Self-pay

## 2023-07-18 ENCOUNTER — Encounter (HOSPITAL_COMMUNITY): Payer: Self-pay

## 2023-07-20 ENCOUNTER — Other Ambulatory Visit: Payer: Self-pay

## 2023-07-21 ENCOUNTER — Other Ambulatory Visit (HOSPITAL_COMMUNITY): Payer: Self-pay

## 2023-07-26 ENCOUNTER — Ambulatory Visit (INDEPENDENT_AMBULATORY_CARE_PROVIDER_SITE_OTHER): Payer: Medicaid Other | Admitting: Podiatry

## 2023-07-26 ENCOUNTER — Encounter: Payer: Self-pay | Admitting: Podiatry

## 2023-07-26 ENCOUNTER — Ambulatory Visit: Payer: Medicaid Other | Admitting: Podiatry

## 2023-07-26 DIAGNOSIS — M79674 Pain in right toe(s): Secondary | ICD-10-CM

## 2023-07-26 DIAGNOSIS — E1142 Type 2 diabetes mellitus with diabetic polyneuropathy: Secondary | ICD-10-CM

## 2023-07-26 DIAGNOSIS — M79675 Pain in left toe(s): Secondary | ICD-10-CM

## 2023-07-26 DIAGNOSIS — B351 Tinea unguium: Secondary | ICD-10-CM | POA: Diagnosis not present

## 2023-07-26 NOTE — Progress Notes (Signed)
Subjective:  Patient ID: Kathleen Kane, female    DOB: August 14, 1961,  MRN: [id number]  Kathleen Kane presents to clinic today for at risk foot care with history of diabetic neuropathy and thick, elongated toenails R 3rd toe which are tender when wearing enclosed shoe gear.  Chief Complaint  Patient presents with   Diabetes    [facility]   New problem(s): None.   PCP is Kathleen Contras, MD.  Allergies  Allergen Reactions   Ace Inhibitors Cough   Lisinopril Cough    Review of Systems: Negative except as noted in the HPI.  Objective: No changes noted in today's physical examination. There were no vitals filed for this visit. Kathleen Kane is a pleasant 62 y.o. female obese in NAD. AAO x 3.  Vascular Examination: Capillary refill time <3 seconds b/l. Vascular status intact b/l with palpable pedal pulses. Pedal hair absent b/l. No edema. No pain with calf compression b/l. Skin temperature gradient WNL b/l. No cyanosis or clubbing noted b/l LE.  Neurological Examination: Pt has subjective symptoms of neuropathy. Protective sensation intact 5/5 intact bilaterally with 10g monofilament b/l. Vibratory sensation intact b/l. Proprioception intact bilaterally.  Dermatological Examination: Pedal skin with normal turgor, texture and tone b/l.  No open wounds. No interdigital macerations.   Toenail right third digit  elongated, thickened, discolored with subungual debris. Anonychia noted 1-5 left foot, right great toe, R 2nd toe, R 4th toe, and R 5th toe. Nailbed(s) epithelialized.   Musculoskeletal Examination: Muscle strength 5/5 to all lower extremity muscle groups bilaterally. Pes planus deformity noted bilateral LE. Utilizes rollator for mobility assistance.  Radiographs: None  Assessment/Plan: 1. Pain due to onychomycosis of toenails of both feet   2. Diabetic peripheral neuropathy associated with type 2 diabetes mellitus (HCC)     -Consent given for treatment as described  below: -Examined patient. -Continue supportive shoe gear daily. -Toenails right third toe was  debrided in length and girth by Dr. Stacie Acres without iatrogenic bleeding with sterile nail nipper and dremel.  -Patient/POA to call should there be question/concern in the interim.   Return in about 3 months (around 10/25/2023) for nail care.  Freddie Breech, DPM

## 2023-08-06 ENCOUNTER — Other Ambulatory Visit: Payer: Self-pay | Admitting: Cardiovascular Disease

## 2023-08-06 ENCOUNTER — Other Ambulatory Visit: Payer: Self-pay | Admitting: Internal Medicine

## 2023-08-09 MED ORDER — POTASSIUM CHLORIDE CRYS ER 10 MEQ PO TBCR: 10.0000 meq | EXTENDED_RELEASE_TABLET | Freq: Two times a day (BID) | ORAL | 1 refills | Status: DC

## 2023-08-11 ENCOUNTER — Other Ambulatory Visit (HOSPITAL_COMMUNITY): Payer: Self-pay

## 2023-08-15 ENCOUNTER — Other Ambulatory Visit (HOSPITAL_COMMUNITY): Payer: Self-pay

## 2023-08-17 ENCOUNTER — Other Ambulatory Visit (HOSPITAL_COMMUNITY): Payer: Self-pay | Admitting: Pharmacy Technician

## 2023-08-17 ENCOUNTER — Other Ambulatory Visit: Payer: Self-pay | Admitting: Internal Medicine

## 2023-08-17 ENCOUNTER — Other Ambulatory Visit (HOSPITAL_COMMUNITY): Payer: Self-pay

## 2023-08-17 DIAGNOSIS — M059 Rheumatoid arthritis with rheumatoid factor, unspecified: Secondary | ICD-10-CM

## 2023-08-17 DIAGNOSIS — Z79899 Other long term (current) drug therapy: Secondary | ICD-10-CM

## 2023-08-17 DIAGNOSIS — M359 Systemic involvement of connective tissue, unspecified: Secondary | ICD-10-CM

## 2023-08-17 NOTE — Progress Notes (Signed)
Specialty Pharmacy Refill Coordination Note  Kathleen Kane is a 62 y.o. female contacted today regarding refills of specialty medication(s) Tocilizumab   Patient requested [name] at [facility] at [place] date: 08/19/23   Medication will be filled on 08/19/23.   Refill request sent to MD; Call if any delays.

## 2023-08-19 ENCOUNTER — Other Ambulatory Visit: Payer: Self-pay

## 2023-08-19 ENCOUNTER — Other Ambulatory Visit: Payer: Self-pay | Admitting: Internal Medicine

## 2023-08-19 ENCOUNTER — Other Ambulatory Visit (HOSPITAL_COMMUNITY): Payer: Self-pay

## 2023-08-19 DIAGNOSIS — Z79899 Other long term (current) drug therapy: Secondary | ICD-10-CM

## 2023-08-19 DIAGNOSIS — M359 Systemic involvement of connective tissue, unspecified: Secondary | ICD-10-CM

## 2023-08-19 DIAGNOSIS — M059 Rheumatoid arthritis with rheumatoid factor, unspecified: Secondary | ICD-10-CM

## 2023-08-19 DIAGNOSIS — J8489 Other specified interstitial pulmonary diseases: Secondary | ICD-10-CM

## 2023-08-19 MED ORDER — ACTEMRA ACTPEN 162 MG/0.9ML ~~LOC~~ SOAJ
162.0000 mg | SUBCUTANEOUS | 2 refills | Status: DC
Start: 2023-08-19 — End: 2023-11-11
  Filled 2023-08-19: qty 3.6, 28d supply, fill #0
  Filled 2023-09-08: qty 3.6, 28d supply, fill #1
  Filled 2023-10-12: qty 3.6, 28d supply, fill #2

## 2023-08-19 NOTE — Progress Notes (Signed)
Refill still pending. Patient is aware.

## 2023-08-19 NOTE — Progress Notes (Signed)
Refill received. LVM for patient to pick up 10/14 in the afternoon.

## 2023-08-19 NOTE — Telephone Encounter (Signed)
Refilled pt's actemra. After doing this, received an automatic message stating that they are waiting for prior authorization. Please advise on this.

## 2023-08-19 NOTE — Telephone Encounter (Signed)
Out of this med. Pharm has sent req twice. Please call it in.Thanks.

## 2023-08-23 ENCOUNTER — Other Ambulatory Visit (HOSPITAL_COMMUNITY): Payer: Self-pay

## 2023-08-23 NOTE — Telephone Encounter (Signed)
A test claim gives a co-pay of $4.00 for one month supply. The medication is currently at [facility] and in a ready to dispense status.

## 2023-08-31 ENCOUNTER — Encounter (HOSPITAL_COMMUNITY): Payer: Self-pay

## 2023-08-31 ENCOUNTER — Ambulatory Visit (HOSPITAL_COMMUNITY)
Admission: EM | Admit: 2023-08-31 | Discharge: 2023-08-31 | Disposition: A | Discharge status: Home / Self Care | Payer: Medicaid Other | Attending: Family Medicine | Admitting: Family Medicine

## 2023-08-31 DIAGNOSIS — M059 Rheumatoid arthritis with rheumatoid factor, unspecified: Secondary | ICD-10-CM

## 2023-08-31 DIAGNOSIS — M150 Primary generalized (osteo)arthritis: Secondary | ICD-10-CM | POA: Diagnosis not present

## 2023-08-31 MED ORDER — PREDNISONE 10 MG (48) PO TBPK: ORAL_TABLET | ORAL | 0 refills | Status: DC

## 2023-08-31 NOTE — ED Provider Notes (Signed)
Kathleen Kane CARE Kane   [id number] 08/31/23 Arrival Time: 0945  ASSESSMENT & PLAN:  1. Rheumatoid arthritis, 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HCC)   2. Primary osteoarthritis involving multiple joints   Arthritis exacerbation/pain. Continue daily 10mg  prednisone.  Begin: New Prescriptions   PREDNISONE (STERAPRED UNI-PAK 48 TAB) 10 MG (48) TBPK TABLET    Take as directed.   Recommend:  Follow-up Information     Schedule an appointment as soon as possible for a visit  with Fleet Contras, MD.   Specialty: Internal Medicine Why: For follow up. Contact information: [address] [place] [address] [phone]                Reviewed expectations re: course of current medical issues. Questions answered. Outlined signs and symptoms indicating need for more acute intervention. Patient verbalized understanding. After Visit Summary given.  SUBJECTIVE: History from: patient. Kathleen Kane is a 62 y.o. female who reports rheumatoid arthritis flare. Pt c/o diffuse joint pain s/t RA flare x2 days. Pt reports she is out of Prednisone, which is what most helps her pain during a flare. No relief with ibuprofen.     Past Surgical History:  Procedure Laterality Date   CARDIOVASCULAR STRESS TEST  02/16/2013   no significant EKG changes with Lexiscan, normal LV function and normal wall function   CESAREAN SECTION     x2   DOPPLER ECHOCARDIOGRAPHY  01/24/2008   EF 55-60%, no diagnostic evidence of LV wall motion abnormalities, LV wall thickness was mild-moderately increased.   HAND SURGERY Right 2018   LAPAROTOMY Right 02/27/2013   Procedure: EXPLORATORY LAPAROTOMY, right salpingo-oopherectomy;  Surgeon: Laurette Schimke, MD;  Location: [facility];  Service: Gynecology;  Laterality: Right;   left breast cyst     SALPINGOOPHORECTOMY Right 02/27/2013   Procedure: SALPINGO OOPHORECTOMY;  Surgeon: Laurette Schimke, MD;  Location: [facility];  Service: Gynecology;  Laterality: Right;   TUBAL LIGATION  Bilateral 1989      OBJECTIVE:  Vitals:   08/31/23 1018  BP: 113/75  Pulse: 85  Resp: 18  Temp: 98.3 F (36.8 C)  TempSrc: Oral  SpO2: 95%    General appearance: alert; no distress HEENT: [place]; AT Neck: supple with FROM Resp: unlabored respirations Extremities: with arthritic changes Skin: warm and dry; no visible rashes Psychological: alert and cooperative; normal mood and affect    Allergies  Allergen Reactions   Ace Inhibitors Cough   Lisinopril Cough    Past Medical History:  Diagnosis Date   Acute on chronic diastolic CHF (congestive heart failure) (HCC) 08/08/2015   Anxiety    Atrial fibrillation, persistent (HCC)    Carpal tunnel syndrome, right 12/15/2016   CHF (congestive heart failure) (HCC) 2013   No echo found from that time, most likely diastolic   CHF exacerbation (HCC) 10/01/2018   Depression    Diabetes mellitus (HCC) 08/10/2012   Diabetes mellitus without complication (HCC)    Hypertension    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 AHI-6.8/hr, during REM-27.3/hr. RDI-25.0/hr, during REM-40.9/hr. 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   Social History   Socioeconomic History  Marital status: Married    Spouse name: Marston,Theodore   Number of children: 2   Years of education: Not on file   Highest education level: Not on file  Occupational History   Not on file  Tobacco Use   Smoking status: Former    Current packs/day: 0.00    Average packs/day: 1 pack/day for 37.6 years (37.6 ttl pk-yrs)    Types: Cigarettes    Start date: 35    Quit date: 06/08/2014    Years  since quitting: 9.2    Passive exposure: Never   Smokeless tobacco: Never  Vaping Use   Vaping status: Former  Substance and Sexual Activity   Alcohol use: No    Alcohol/week: 0.0 standard drinks of alcohol   Drug use: No   Sexual activity: Yes    Partners: Male  Other Topics Concern   Not on file  Social History Narrative   Not on file   Social Determinants of Health   Financial Resource Strain: Not on file  Food Insecurity: Not on file  Transportation Needs: Not on file  Physical Activity: Not on file  Stress: Not on file  Social Connections: Unknown (03/22/2022)   Received from [employer], [facility]   Social Network    Social Network: Not on file   Family History  Problem Relation Age of Onset   Hypertension Mother    Diabetes Mother    Throat cancer Mother    Hypertension Father    Diabetes Sister    Heart Problems Sister    Kidney failure Sister    Hypertension Brother    Diabetes Brother    Diabetes Paternal Grandmother    Autoimmune disease Daughter    Healthy Son    Past Surgical History:  Procedure Laterality Date   CARDIOVASCULAR STRESS TEST  02/16/2013   no significant EKG changes with Lexiscan, normal LV function and normal wall function   CESAREAN SECTION     x2   DOPPLER ECHOCARDIOGRAPHY  01/24/2008   EF 55-60%, no diagnostic evidence of LV wall motion abnormalities, LV wall thickness was mild-moderately increased.   HAND SURGERY Right 2018   LAPAROTOMY Right 02/27/2013   Procedure: EXPLORATORY LAPAROTOMY, right salpingo-oopherectomy;  Surgeon: Laurette Schimke, MD;  Location: [facility];  Service: Gynecology;  Laterality: Right;   left breast cyst     SALPINGOOPHORECTOMY Right 02/27/2013   Procedure: SALPINGO OOPHORECTOMY;  Surgeon: Laurette Schimke, MD;  Location: [facility];  Service: Gynecology;  Laterality: Right;   TUBAL LIGATION Bilateral 1989       Mardella Layman, MD 08/31/23 1047

## 2023-08-31 NOTE — Discharge Instructions (Signed)
Prednisone will cause your blood sugars to be higher than normal. Watch closely.

## 2023-08-31 NOTE — ED Triage Notes (Signed)
Pt c/o diffuse joint pain s/t RA flare x2 days. Pt reports she is out of Prednisone, which is what most helps her pain during a flare. No relief with ibuprofen.

## 2023-09-07 ENCOUNTER — Other Ambulatory Visit (HOSPITAL_COMMUNITY): Payer: Self-pay

## 2023-09-08 ENCOUNTER — Other Ambulatory Visit (HOSPITAL_COMMUNITY): Payer: Self-pay

## 2023-09-08 ENCOUNTER — Other Ambulatory Visit (HOSPITAL_COMMUNITY): Payer: Self-pay | Admitting: Pharmacy Technician

## 2023-09-08 NOTE — Progress Notes (Signed)
Specialty Pharmacy Refill Coordination Note  Kathleen Kane is a 62 y.o. female contacted today regarding refills of specialty medication(s) Tocilizumab   Patient requested [name] at [facility] at [place] date: 09/15/23   Medication will be filled on 09/14/23.

## 2023-09-19 ENCOUNTER — Other Ambulatory Visit (HOSPITAL_COMMUNITY): Payer: Self-pay

## 2023-10-05 ENCOUNTER — Other Ambulatory Visit (HOSPITAL_COMMUNITY): Payer: Self-pay

## 2023-10-10 ENCOUNTER — Encounter (HOSPITAL_COMMUNITY): Payer: Self-pay

## 2023-10-10 ENCOUNTER — Other Ambulatory Visit (HOSPITAL_COMMUNITY): Payer: Self-pay

## 2023-10-12 ENCOUNTER — Other Ambulatory Visit (HOSPITAL_COMMUNITY): Payer: Self-pay | Admitting: Pharmacy Technician

## 2023-10-12 ENCOUNTER — Other Ambulatory Visit (HOSPITAL_COMMUNITY): Payer: Self-pay

## 2023-10-12 NOTE — Progress Notes (Signed)
Specialty Pharmacy Refill Coordination Note  Kathleen Kane is a 62 y.o. female contacted today regarding refills of specialty medication(s) Tocilizumab   Patient requested [name] at [facility] at [place] date: 10/14/23   Medication will be filled on 10/13/23.

## 2023-10-13 ENCOUNTER — Other Ambulatory Visit: Payer: Self-pay

## 2023-10-18 ENCOUNTER — Other Ambulatory Visit (HOSPITAL_COMMUNITY): Payer: Self-pay

## 2023-10-20 ENCOUNTER — Emergency Department (HOSPITAL_COMMUNITY): Payer: Medicaid Other

## 2023-10-20 ENCOUNTER — Emergency Department (HOSPITAL_COMMUNITY)
Admission: EM | Admit: 2023-10-20 | Discharge: 2023-10-20 | Disposition: A | Discharge status: Home / Self Care | Payer: Medicaid Other | Attending: Emergency Medicine | Admitting: Emergency Medicine

## 2023-10-20 ENCOUNTER — Encounter (HOSPITAL_COMMUNITY): Payer: Self-pay

## 2023-10-20 ENCOUNTER — Ambulatory Visit (HOSPITAL_COMMUNITY): Payer: Medicaid Other

## 2023-10-20 DIAGNOSIS — R519 Headache, unspecified: Secondary | ICD-10-CM | POA: Diagnosis present

## 2023-10-20 DIAGNOSIS — I4891 Unspecified atrial fibrillation: Secondary | ICD-10-CM | POA: Diagnosis not present

## 2023-10-20 DIAGNOSIS — M542 Cervicalgia: Secondary | ICD-10-CM | POA: Insufficient documentation

## 2023-10-20 DIAGNOSIS — Z7901 Long term (current) use of anticoagulants: Secondary | ICD-10-CM | POA: Diagnosis not present

## 2023-10-20 DIAGNOSIS — S0990XA Unspecified injury of head, initial encounter: Secondary | ICD-10-CM | POA: Insufficient documentation

## 2023-10-20 DIAGNOSIS — E876 Hypokalemia: Secondary | ICD-10-CM | POA: Diagnosis not present

## 2023-10-20 DIAGNOSIS — Z79899 Other long term (current) drug therapy: Secondary | ICD-10-CM | POA: Diagnosis not present

## 2023-10-20 DIAGNOSIS — W010XXA Fall on same level from slipping, tripping and stumbling without subsequent striking against object, initial encounter: Secondary | ICD-10-CM | POA: Diagnosis not present

## 2023-10-20 DIAGNOSIS — E119 Type 2 diabetes mellitus without complications: Secondary | ICD-10-CM | POA: Diagnosis not present

## 2023-10-20 DIAGNOSIS — W19XXXA Unspecified fall, initial encounter: Secondary | ICD-10-CM

## 2023-10-20 DIAGNOSIS — R051 Acute cough: Secondary | ICD-10-CM | POA: Insufficient documentation

## 2023-10-20 DIAGNOSIS — Z1152 Encounter for screening for COVID-19: Secondary | ICD-10-CM | POA: Insufficient documentation

## 2023-10-20 DIAGNOSIS — Z7984 Long term (current) use of oral hypoglycemic drugs: Secondary | ICD-10-CM | POA: Diagnosis not present

## 2023-10-20 DIAGNOSIS — I509 Heart failure, unspecified: Secondary | ICD-10-CM | POA: Diagnosis not present

## 2023-10-20 LAB — BASIC METABOLIC PANEL
Anion gap: 14 (ref 5–15)
Anion gap: 12 (ref 5–15)
BUN: 38 mg/dL — ABNORMAL HIGH (ref 8–23)
BUN: 35 mg/dL — ABNORMAL HIGH (ref 8–23)
CO2: 27 mmol/L (ref 22–32)
CO2: 27 mmol/L (ref 22–32)
Calcium: 9.2 mg/dL (ref 8.9–10.3)
Calcium: 9.2 mg/dL (ref 8.9–10.3)
Chloride: 95 mmol/L — ABNORMAL LOW (ref 98–111)
Chloride: 97 mmol/L — ABNORMAL LOW (ref 98–111)
Creatinine, Ser: 1.78 mg/dL — ABNORMAL HIGH (ref 0.44–1.00)
Creatinine, Ser: 1.34 mg/dL — ABNORMAL HIGH (ref 0.44–1.00)
GFR, Estimated: 31 mL/min — ABNORMAL LOW (ref >60)
GFR, Estimated: 44 mL/min — ABNORMAL LOW (ref >60)
Glucose, Bld: 289 mg/dL — ABNORMAL HIGH (ref 70–99)
Glucose, Bld: 191 mg/dL — ABNORMAL HIGH (ref 70–99)
Potassium: 2.7 mmol/L — CL (ref 3.5–5.1)
Potassium: 3.1 mmol/L — ABNORMAL LOW (ref 3.5–5.1)
Sodium: 136 mmol/L (ref 135–145)
Sodium: 136 mmol/L (ref 135–145)

## 2023-10-20 LAB — CBC WITH DIFFERENTIAL/PLATELET
Abs Immature Granulocytes: 0.03 10*3/uL (ref 0.00–0.07)
Basophils Absolute: 0 10*3/uL (ref 0.0–0.1)
Basophils Relative: 0 %
Eosinophils Absolute: 0.1 10*3/uL (ref 0.0–0.5)
Eosinophils Relative: 1 %
HCT: 37.3 % (ref 36.0–46.0)
Hemoglobin: 12.8 g/dL (ref 12.0–15.0)
Immature Granulocytes: 1 %
Lymphocytes Relative: 17 %
Lymphs Abs: 1 10*3/uL (ref 0.7–4.0)
MCH: 31.6 pg (ref 26.0–34.0)
MCHC: 34.3 g/dL (ref 30.0–36.0)
MCV: 92.1 fL (ref 80.0–100.0)
Monocytes Absolute: 0.5 10*3/uL (ref 0.1–1.0)
Monocytes Relative: 9 %
Neutro Abs: 4.2 10*3/uL (ref 1.7–7.7)
Neutrophils Relative %: 72 %
Platelets: 199 10*3/uL (ref 150–400)
RBC: 4.05 MIL/uL (ref 3.87–5.11)
RDW: 13.7 % (ref 11.5–15.5)
WBC: 5.7 10*3/uL (ref 4.0–10.5)
nRBC: 0 % (ref 0.0–0.2)

## 2023-10-20 LAB — RESP PANEL BY RT-PCR (RSV, FLU A&B, COVID)  RVPGX2
Influenza A by PCR: NEGATIVE
Influenza B by PCR: NEGATIVE
Resp Syncytial Virus by PCR: NEGATIVE
SARS Coronavirus 2 by RT PCR: NEGATIVE

## 2023-10-20 MED ORDER — POTASSIUM CHLORIDE 20 MEQ PO PACK
40.0000 meq | PACK | Freq: Once | ORAL | Status: AC
  Administered 2023-10-20: 40 meq via ORAL
  Filled 2023-10-20: qty 2

## 2023-10-20 MED ORDER — POTASSIUM CHLORIDE IN NACL 20-0.9 MEQ/L-% IV SOLN
Freq: Once | INTRAVENOUS | Status: AC
  Filled 2023-10-20: qty 1000

## 2023-10-20 NOTE — ED Provider Notes (Signed)
[place] EMERGENCY DEPARTMENT AT [facility] Provider Note   CSN: [id number] Arrival date & time: 10/20/23  1001     History  Chief Complaint  Patient presents with   Kathleen Kane is a 62 y.o. female.  With a history of atrial fibrillation on Eliquis, heart failure, diabetes and osteoarthritis who presents to the ED after multiple falls.  Larey Seat backwards 2 weeks ago striking the back of her head no LOC.  Was not evaluated at that time.  Also struck her head while getting into the car 2 days ago.  Is here today for headaches, neck pain as well as cough and congestion.  Reports compliance with Eliquis.  No fevers chills chest pain shortness of breath, nausea, vomiting or other complaints at this time   Fall       Home Medications Prior to Admission medications   Medication Sig Start Date End Date Taking? Authorizing Provider  Accu-Chek FastClix Lancets MISC Apply topically. 08/04/20   [provider]  ACCU-CHEK GUIDE test strip  08/14/20   [provider]  atorvastatin (LIPITOR) 20 MG tablet TAKE 1 TABLET (20 MG TOTAL) BY MOUTH DAILY. PLEASE SCHEDULE YEARLY APPOINTMENT FOR FUTURE REFILLS. 1ST ATTEMPT. THANK YOU 01/29/22   Wendall Stade, MD  B-D ULTRAFINE III SHORT PEN 31G X 8 MM MISC Inject into the skin 2 (two) times daily. 06/30/20   [provider]  blood glucose meter kit and supplies Dispense based on patient and insurance preference. Use up to four times daily as directed. (FOR ICD-10 E10.9, E11.9). 10/03/18   Glade Lloyd, MD  diazepam (VALIUM) 5 MG tablet Take 1 tablet (5 mg total) by mouth every 6 (six) hours as needed for muscle spasms. 04/16/23   Lorre Nick, MD  ELIQUIS 5 MG TABS tablet TAKE 1 TABLET BY MOUTH TWICE A DAY 06/13/23   Wendall Stade, MD  ferrous sulfate 325 (65 FE) MG tablet Take 1 tablet (325 mg total) by mouth daily with breakfast. 03/15/22   Marinda Elk, MD  folic acid (FOLVITE) 1 MG tablet Take 1  mg by mouth every morning. 08/19/17   [provider]  furosemide (LASIX) 40 MG tablet Take 1 tablet (40 mg total) by mouth daily. 07/06/22   Carlisle Beers, FNP  levalbuterol [facility] HFA) 45 MCG/ACT inhaler Inhale 1-2 puffs into the lungs every 6 (six) hours as needed for wheezing. 03/19/23   Valinda Hoar, NP  losartan (COZAAR) 25 MG tablet TAKE 1 TABLET BY MOUTH EVERY DAY 10/29/19   Wendall Stade, MD  metFORMIN (GLUCOPHAGE) 500 MG tablet Take 2 tablets (1,000 mg total) by mouth 2 (two) times daily. 10/03/18   Glade Lloyd, MD  metolazone (ZAROXOLYN) 5 MG tablet Take 1 tablet (5 mg total) by mouth as needed (edema). Take 30 minutes prior to taking Furosemide ( lasix ) 11/29/22   Wendall Stade, MD  metoprolol tartrate (LOPRESSOR) 25 MG tablet TAKE 1 TABLET BY MOUTH TWICE A DAY 06/08/23   Wendall Stade, MD  NOVOLOG MIX 70/30 FLEXPEN (70-30) 100 UNIT/ML FlexPen Inject 0.4 mLs (40 Units total) into the skin 2 (two) times daily with a meal. 10/03/18   Glade Lloyd, MD  oxyCODONE-acetaminophen (PERCOCET/ROXICET) 5-325 MG tablet Take 1 tablet by mouth every 6 (six) hours as needed for severe pain. 04/16/23   Lorre Nick, MD  potassium chloride (KLOR-CON M) 10 MEQ tablet Take 1 tablet (10 mEq total) by mouth  2 (two) times daily. 08/09/23   Wendall Stade, MD  predniSONE (DELTASONE) 10 MG tablet TAKE 1 TABLET BY MOUTH EVERY DAY 08/09/23   Kalman Shan, MD  predniSONE (STERAPRED UNI-PAK 48 TAB) 10 MG (48) TBPK tablet Take as directed. 08/31/23   Mardella Layman, MD  spironolactone (ALDACTONE) 25 MG tablet Take 0.5 tablets (12.5 mg total) by mouth daily. 04/21/22   Swinyer, Zachary George, NP  sulfamethoxazole-trimethoprim (BACTRIM DS) 800-160 MG tablet Take 1 tablet by mouth 3 (three) times a week. (Mon, Wed, Fri) 05/23/23   Kalman Shan, MD  Tocilizumab (ACTEMRA ACTPEN) 162 MG/0.9ML SOAJ Inject 162 mg into the skin every 7 (seven) days. 08/19/23   Kalman Shan, MD       Allergies    Ace inhibitors and Lisinopril    Review of Systems   Review of Systems  Physical Exam Updated Vital Signs BP (!) 152/73 (BP Location: Right Arm)   Pulse 80   Temp 98 F (36.7 C) (Oral)   Resp 17   LMP 10/28/2013   SpO2 94%  Physical Exam Vitals and nursing note reviewed.  HENT:     Head: Normocephalic and atraumatic.  Eyes:     Extraocular Movements: Extraocular movements intact.     Pupils: Pupils are equal, round, and reactive to light.  Cardiovascular:     Rate and Rhythm: Normal rate and regular rhythm.  Pulmonary:     Effort: Pulmonary effort is normal.     Breath sounds: Normal breath sounds.  Abdominal:     Palpations: Abdomen is soft.     Tenderness: There is no abdominal tenderness.  Musculoskeletal:     Cervical back: Neck supple. Tenderness present. No rigidity.  Skin:    General: Skin is warm and dry.  Neurological:     General: No focal deficit present.     Mental Status: She is alert and oriented to person, place, and time.     Sensory: No sensory deficit.     Motor: No weakness.  Psychiatric:        Mood and Affect: Mood normal.     ED Results / Procedures / Treatments   Labs (all labs ordered are listed, but only abnormal results are displayed) Labs Reviewed  BASIC METABOLIC PANEL - Abnormal; Notable for the following components:      Result Value   Potassium 2.7 (*)    Chloride 95 (*)    Glucose, Bld 289 (*)    BUN 38 (*)    Creatinine, Ser 1.78 (*)    GFR, Estimated 31 (*)    All other components within normal limits  RESP PANEL BY RT-PCR (RSV, FLU A&B, COVID)  RVPGX2  CBC WITH DIFFERENTIAL/PLATELET  BASIC METABOLIC PANEL    EKG EKG Interpretation Date/Time:  Thursday October 20 2023 11:37:47 EST Ventricular Rate:  86 PR Interval:    QRS Duration:  142 QT Interval:  468 QTC Calculation: 560 R Axis:   -31  Text Interpretation: Atrial fibrillation Right bundle branch block Confirmed by Estelle June [phone]) on  10/20/2023 3:27:32 PM  Radiology DG Chest Portable 1 View Result Date: 10/20/2023 CLINICAL DATA:  Fall.  Cold symptoms.?  Pneumonia. EXAM: PORTABLE CHEST 1 VIEW COMPARISON:  06/25/2023. FINDINGS: Low lung volume. Redemonstration of diffuse increased interstitial markings throughout bilateral lungs, essentially similar to the prior study. Please refer to CT scan chest report from 05/19/2023 for details. Bilateral lung fields are otherwise clear. No acute consolidation or lung collapse. Bilateral costophrenic angles  are clear. Stable cardio-mediastinal silhouette. No acute osseous abnormalities. The soft tissues are within normal limits. IMPRESSION: *No acute cardiopulmonary abnormality. *Redemonstration of diffuse increased interstitial markings, better evaluated on the prior chest CT scan from 05/19/2023. Electronically Signed   By: Jules Schick M.D.   On: 10/20/2023 12:47   CT Head Wo Contrast Result Date: 10/20/2023 CLINICAL DATA:  Head trauma, moderate-severe fall on eliq; Neck trauma, midline tenderness (Age 67-64y) fall EXAM: CT HEAD WITHOUT CONTRAST CT CERVICAL SPINE WITHOUT CONTRAST TECHNIQUE: Multidetector CT imaging of the head and cervical spine was performed following the standard protocol without intravenous contrast. Multiplanar CT image reconstructions of the cervical spine were also generated. RADIATION DOSE REDUCTION: This exam was performed according to the departmental dose-optimization program which includes automated exposure control, adjustment of the mA and/or kV according to patient size and/or use of iterative reconstruction technique. COMPARISON:  CTA Chest 09/29/22 FINDINGS: CT HEAD FINDINGS Brain: No hemorrhage. No hydrocephalus. No extra-axial fluid collection. No CT evidence acute cortical infarct. No mass effect. No mass lesion. Enlarged and partially empty sella. Vascular: No hyperdense vessel or unexpected calcification. Skull: Normal. Negative for fracture or focal lesion.  Sinuses/Orbits: Small right mastoid effusion. No middle ear effusion. Paranasal sinuses are clear. Orbits are unremarkable. Other: None. CT CERVICAL SPINE FINDINGS Alignment: Normal. Skull base and vertebrae: No acute fracture. No primary bone lesion or focal pathologic process. Soft tissues and spinal canal: No prevertebral fluid or swelling. No visible canal hematoma. Disc levels:  No evidence high-grade spinal canal stenosis. Upper chest: Unchanged ground-glass opacity in the right upper lobe. Other: Aberrant right subclavian artery. IMPRESSION: 1. No acute intracranial abnormality. 2. No acute fracture or traumatic subluxation of the cervical spine. 3. Unchanged ground-glass opacity in the right upper lobe. Electronically Signed   By: Lorenza Cambridge M.D.   On: 10/20/2023 11:42   CT Cervical Spine Wo Contrast Result Date: 10/20/2023 CLINICAL DATA:  Head trauma, moderate-severe fall on eliq; Neck trauma, midline tenderness (Age 67-64y) fall EXAM: CT HEAD WITHOUT CONTRAST CT CERVICAL SPINE WITHOUT CONTRAST TECHNIQUE: Multidetector CT imaging of the head and cervical spine was performed following the standard protocol without intravenous contrast. Multiplanar CT image reconstructions of the cervical spine were also generated. RADIATION DOSE REDUCTION: This exam was performed according to the departmental dose-optimization program which includes automated exposure control, adjustment of the mA and/or kV according to patient size and/or use of iterative reconstruction technique. COMPARISON:  CTA Chest 09/29/22 FINDINGS: CT HEAD FINDINGS Brain: No hemorrhage. No hydrocephalus. No extra-axial fluid collection. No CT evidence acute cortical infarct. No mass effect. No mass lesion. Enlarged and partially empty sella. Vascular: No hyperdense vessel or unexpected calcification. Skull: Normal. Negative for fracture or focal lesion. Sinuses/Orbits: Small right mastoid effusion. No middle ear effusion. Paranasal sinuses are  clear. Orbits are unremarkable. Other: None. CT CERVICAL SPINE FINDINGS Alignment: Normal. Skull base and vertebrae: No acute fracture. No primary bone lesion or focal pathologic process. Soft tissues and spinal canal: No prevertebral fluid or swelling. No visible canal hematoma. Disc levels:  No evidence high-grade spinal canal stenosis. Upper chest: Unchanged ground-glass opacity in the right upper lobe. Other: Aberrant right subclavian artery. IMPRESSION: 1. No acute intracranial abnormality. 2. No acute fracture or traumatic subluxation of the cervical spine. 3. Unchanged ground-glass opacity in the right upper lobe. Electronically Signed   By: Lorenza Cambridge M.D.   On: 10/20/2023 11:42    Procedures Procedures    Medications Ordered in ED Medications  0.9 % NaCl with KCl 20 mEq/ L  infusion ( Intravenous New Bag/Given 10/20/23 1357)  potassium chloride (KLOR-CON) packet 40 mEq (40 mEq Oral Given 10/20/23 1345)    ED Course/ Medical Decision Making/ A&P Clinical Course as of 10/20/23 1602  Thu Oct 20, 2023  1525 No acute traumatic findings on CT head or CT C-spine  Chest x-ray shows no acute abnormality either  Respiratory panel negative for COVID/influenza/RSV  No leukocytosis or significant anemia on CBC  Metabolic panel notable for hypokalemia.  Patient has a history of hypokalemia and states she has missed a few doses of her supplemental potassium recently in the last week.  Will provide oral and IV past potassium repletion and recheck here.  No EKG changes concerning for severe hypokalemia [MP]  1600 I, Estelle June DO, am transitioning care of this patient to the oncoming provider pending repeat BMP, reevaluation and disposition [MP]    Clinical Course User Index [MP] Royanne Foots, DO                                 Medical Decision Making 61 year old female with history as above presenting after 2 recent falls with headache some neck pain as well as reported cough and  congestion.  She is on Eliquis.  Midline tenderness on cervical spine no other evidence of trauma or areas of tenderness.  Considering the fall on Eliquis will obtain CT head and C-spine to evaluate for traumatic injury and intracranial hemorrhage  Regarding cough and congestion will obtain laboratory workup, EKG, COVID swab and chest x-ray to look for acute illness such as pneumonia versus viral infection.  Stable on room air  Amount and/or Complexity of Data Reviewed Labs: ordered. Radiology: ordered.  Risk Prescription drug management.           Final Clinical Impression(s) / ED Diagnoses Final diagnoses:  Fall, initial encounter  Injury of head, initial encounter  Acute cough  Hypokalemia  Atrial fibrillation, unspecified type [facility])    Rx / DC Orders ED Discharge Orders     None         Royanne Foots, DO 10/20/23 1603

## 2023-10-20 NOTE — ED Provider Notes (Signed)
Patient signed out to me by Dr. Belva Crome pending results of repeat electrolytes.  Creatinine has improved.  Patient's potassium is also improved as well 2.  Patient stable for discharge   Lorre Nick, MD 10/20/23 1715

## 2023-10-20 NOTE — ED Triage Notes (Signed)
Pt presents with c/o multiple falls over the last several weeks. Pt reports 2 falls where she has hit her head over the last 2 weeks. Pt also reports cold symptoms.

## 2023-10-20 NOTE — Discharge Instructions (Signed)
You were seen in the emergency department for injuries after a fall and a cough A CAT scan taken of your head and neck and your chest x-ray looked okay Your blood work showed low potassium most likely because you have been missing doses of your potassium supplement at home We gave you oral and IV potassium here and your potassium level came up You will need to have this rechecked from your primary care doctor within the next week Return to the emergency room for repeated falls chest pain, trouble breathing or any other concerns

## 2023-10-20 NOTE — ED Notes (Signed)
New BMP sent to lab

## 2023-10-26 ENCOUNTER — Encounter: Payer: Self-pay | Admitting: Podiatry

## 2023-10-26 ENCOUNTER — Ambulatory Visit (INDEPENDENT_AMBULATORY_CARE_PROVIDER_SITE_OTHER): Payer: Medicaid Other | Admitting: Podiatry

## 2023-10-26 DIAGNOSIS — M79675 Pain in left toe(s): Secondary | ICD-10-CM

## 2023-10-26 DIAGNOSIS — M79674 Pain in right toe(s): Secondary | ICD-10-CM

## 2023-10-26 DIAGNOSIS — B351 Tinea unguium: Secondary | ICD-10-CM

## 2023-10-26 DIAGNOSIS — E1142 Type 2 diabetes mellitus with diabetic polyneuropathy: Secondary | ICD-10-CM

## 2023-10-26 NOTE — Progress Notes (Signed)
This patient returns to my office for at risk foot care.  This patient requires this care by a professional since this patient will be at risk due to having diabetes.  This patient is unable to cut nails herself since the patient cannot reach her nails.These nails are painful walking and wearing shoes. She says all her nails were remobed and only the third toenail right foot has regrown. This patient presents for at risk foot care today.  General Appearance  Alert, conversant and in no acute stress.  Vascular  Dorsalis pedis and posterior tibial  pulses are palpable  bilaterally.  Capillary return is within normal limits  bilaterally. Temperature is within normal limits  bilaterally.  Neurologic  Senn-Weinstein monofilament wire test within normal limits  bilaterally. Muscle power within normal limits bilaterally.  Nails Thick disfigured discolored nails with subungual debris  third toenail right foot. No evidence of bacterial infection or drainage bilaterally.  Orthopedic  No limitations of motion  feet .  No crepitus or effusions noted.  No bony pathology or digital deformities noted.  Skin  normotropic skin with no porokeratosis noted bilaterally.  No signs of infections or ulcers noted.     Onychomycosis  Pain in right toes  Pain in left toes  Consent was obtained for treatment procedures.   Mechanical debridement of nails 1-5  bilaterally performed with a nail nipper.  Filed with dremel without incident.    Return office visit    Dr.  Adah Perl                 Told patient to return for periodic foot care and evaluation due to potential at risk complications.   Gardiner Barefoot DPM

## 2023-10-29 ENCOUNTER — Other Ambulatory Visit: Payer: Self-pay | Admitting: Internal Medicine

## 2023-10-31 ENCOUNTER — Other Ambulatory Visit: Payer: Self-pay

## 2023-11-03 ENCOUNTER — Other Ambulatory Visit (HOSPITAL_COMMUNITY): Payer: Self-pay

## 2023-11-08 ENCOUNTER — Other Ambulatory Visit: Payer: Self-pay | Admitting: Internal Medicine

## 2023-11-08 ENCOUNTER — Other Ambulatory Visit: Payer: Self-pay

## 2023-11-08 DIAGNOSIS — Z111 Encounter for screening for respiratory tuberculosis: Secondary | ICD-10-CM

## 2023-11-08 DIAGNOSIS — M359 Systemic involvement of connective tissue, unspecified: Secondary | ICD-10-CM

## 2023-11-08 DIAGNOSIS — Z5181 Encounter for therapeutic drug level monitoring: Secondary | ICD-10-CM

## 2023-11-08 DIAGNOSIS — J8489 Other specified interstitial pulmonary diseases: Secondary | ICD-10-CM

## 2023-11-08 DIAGNOSIS — Z79899 Other long term (current) drug therapy: Secondary | ICD-10-CM

## 2023-11-08 DIAGNOSIS — M059 Rheumatoid arthritis with rheumatoid factor, unspecified: Secondary | ICD-10-CM

## 2023-11-08 NOTE — Progress Notes (Signed)
 Specialty Pharmacy Refill Coordination Note  Kathleen Kane is a 62 y.o. female contacted today regarding refills of specialty medication(s) Tocilizumab  (Actemra  ACTPen)   Patient requested [name] at [facility] at [place] date: 11/14/23   Medication will be filled on 01.03.25.  Refill request pending

## 2023-11-09 ENCOUNTER — Other Ambulatory Visit: Payer: Self-pay | Admitting: Cardiovascular Disease

## 2023-11-11 ENCOUNTER — Other Ambulatory Visit: Payer: Self-pay

## 2023-11-11 ENCOUNTER — Telehealth: Payer: Self-pay

## 2023-11-11 MED ORDER — ACTEMRA ACTPEN 162 MG/0.9ML ~~LOC~~ SOAJ
162.0000 mg | SUBCUTANEOUS | 0 refills | Status: DC
  Filled 2023-11-11: qty 3.6, 28d supply, fill #0

## 2023-11-11 NOTE — Telephone Encounter (Signed)
 Received notification from [facility] pharmacy that patient requires a new authorization for their medication.  Submitted an URGENT Prior Authorization request to [facility] MEDICAID for ACTEMRA  SQ via CoverMyMeds. Will update once we receive a response.  Key: [id number]

## 2023-11-11 NOTE — Telephone Encounter (Signed)
 Refill sent for ACTEMRA  SQ to [facility]: [phone] (1 month only)  Dose: 162mg  SQ every 7 days  Last OV: 05/23/23 Provider: Dr. Geronimo Pertinent labs: CBC on 10/20/23 wnl  Patient needs updated TB gold, LFTs, and lipid panel. Patient is stopping by on Monday, 11/14/2023 for labs. Order placed  Next OV: not yet scheduled  Routing to scheduling team for follow-up on appt scheduling  Sherry Pennant, PharmD, MPH, BCPS Clinical Pharmacist (Rheumatology and Pulmonology)

## 2023-11-14 ENCOUNTER — Telehealth: Payer: Self-pay | Admitting: Internal Medicine

## 2023-11-14 ENCOUNTER — Other Ambulatory Visit (INDEPENDENT_AMBULATORY_CARE_PROVIDER_SITE_OTHER): Payer: Medicaid Other

## 2023-11-14 ENCOUNTER — Other Ambulatory Visit: Payer: Self-pay

## 2023-11-14 DIAGNOSIS — M359 Systemic involvement of connective tissue, unspecified: Secondary | ICD-10-CM

## 2023-11-14 DIAGNOSIS — J8489 Other specified interstitial pulmonary diseases: Secondary | ICD-10-CM

## 2023-11-14 DIAGNOSIS — Z5181 Encounter for therapeutic drug level monitoring: Secondary | ICD-10-CM | POA: Diagnosis not present

## 2023-11-14 DIAGNOSIS — M059 Rheumatoid arthritis with rheumatoid factor, unspecified: Secondary | ICD-10-CM | POA: Diagnosis not present

## 2023-11-14 DIAGNOSIS — Z79899 Other long term (current) drug therapy: Secondary | ICD-10-CM | POA: Diagnosis not present

## 2023-11-14 DIAGNOSIS — Z111 Encounter for screening for respiratory tuberculosis: Secondary | ICD-10-CM

## 2023-11-14 NOTE — Telephone Encounter (Signed)
 If Blood Work order states [employer] [facility] collect can they still come here? Please call PT to advise. [phone]

## 2023-11-15 LAB — CBC WITH DIFFERENTIAL/PLATELET
Basophils Absolute: 0.1 10*3/uL (ref 0.0–0.1)
Basophils Relative: 1.3 % (ref 0.0–3.0)
Eosinophils Absolute: 0.1 10*3/uL (ref 0.0–0.7)
Eosinophils Relative: 2.3 % (ref 0.0–5.0)
HCT: 40.4 % (ref 36.0–46.0)
Hemoglobin: 13.9 g/dL (ref 12.0–15.0)
Lymphocytes Relative: 16.8 % (ref 12.0–46.0)
Lymphs Abs: 0.9 10*3/uL (ref 0.7–4.0)
MCHC: 34.3 g/dL (ref 30.0–36.0)
MCV: 96.9 fL (ref 78.0–100.0)
Monocytes Absolute: 0.4 10*3/uL (ref 0.1–1.0)
Monocytes Relative: 6.8 % (ref 3.0–12.0)
Neutro Abs: 3.9 10*3/uL (ref 1.4–7.7)
Neutrophils Relative %: 72.8 % (ref 43.0–77.0)
Platelets: 211 10*3/uL (ref 150.0–400.0)
RBC: 4.17 Mil/uL (ref 3.87–5.11)
RDW: 14.4 % (ref 11.5–15.5)
WBC: 5.4 10*3/uL (ref 4.0–10.5)

## 2023-11-15 LAB — COMPREHENSIVE METABOLIC PANEL
ALT: 18 U/L (ref 0–35)
AST: 17 U/L (ref 0–37)
Albumin: 4.5 g/dL (ref 3.5–5.2)
Alkaline Phosphatase: 47 U/L (ref 39–117)
BUN: 38 mg/dL — ABNORMAL HIGH (ref 6–23)
CO2: 26 meq/L (ref 19–32)
Calcium: 10.9 mg/dL — ABNORMAL HIGH (ref 8.4–10.5)
Chloride: 97 meq/L (ref 96–112)
Creatinine, Ser: 1.59 mg/dL — ABNORMAL HIGH (ref 0.40–1.20)
GFR: 34.08 mL/min — ABNORMAL LOW (ref >60.00)
Glucose, Bld: 215 mg/dL — ABNORMAL HIGH (ref 70–99)
Potassium: 4.3 meq/L (ref 3.5–5.1)
Sodium: 136 meq/L (ref 135–145)
Total Bilirubin: 0.6 mg/dL (ref 0.2–1.2)
Total Protein: 7.1 g/dL (ref 6.0–8.3)

## 2023-11-15 LAB — HEPATIC FUNCTION PANEL
ALT: 18 U/L (ref 0–35)
AST: 17 U/L (ref 0–37)
Albumin: 4.5 g/dL (ref 3.5–5.2)
Alkaline Phosphatase: 47 U/L (ref 39–117)
Bilirubin, Direct: 0.1 mg/dL (ref 0.0–0.3)
Total Bilirubin: 0.6 mg/dL (ref 0.2–1.2)
Total Protein: 7.1 g/dL (ref 6.0–8.3)

## 2023-11-15 LAB — LIPID PANEL
Cholesterol: 173 mg/dL (ref 0–200)
HDL: 53.9 mg/dL (ref >39.00)
LDL Cholesterol: 86 mg/dL (ref 0–99)
NonHDL: 119.24
Total CHOL/HDL Ratio: 3
Triglycerides: 167 mg/dL — ABNORMAL HIGH (ref 0.0–149.0)
VLDL: 33.4 mg/dL (ref 0.0–40.0)

## 2023-11-15 NOTE — Telephone Encounter (Signed)
Labs already performed

## 2023-11-16 ENCOUNTER — Other Ambulatory Visit: Payer: Self-pay

## 2023-11-16 ENCOUNTER — Other Ambulatory Visit (HOSPITAL_COMMUNITY): Payer: Self-pay

## 2023-11-16 LAB — QUANTIFERON-TB GOLD PLUS
Mitogen-NIL: 0.62 [IU]/mL
NIL: 0.03 [IU]/mL
QuantiFERON-TB Gold Plus: NEGATIVE
TB1-NIL: 0 [IU]/mL
TB2-NIL: 0 [IU]/mL

## 2023-11-16 NOTE — Telephone Encounter (Signed)
 Received notification from [facility] regarding a prior authorization for ACTEMRA  SQ. Authorization has been APPROVED from 11/11/2023 to 11/10/2024. Approval letter sent to scan center.  Patient can continue to fill through [facility]: [phone]   Authorization # [id number]  Tamra with [facility] notified  Patient completed labs - updated TB gold negative  Sherry Pennant, PharmD, MPH, BCPS, CPP Clinical Pharmacist (Rheumatology and Pulmonology)

## 2023-11-21 IMAGING — MG MM DIGITAL DIAGNOSTIC UNILAT*L* W/ TOMO W/ CAD
6 series · 6 of 18 positions shown · non-contrast
Comparison: Previous exam(s).

CLINICAL DATA: 60-year-old female with areas of palpable thickening
throughout the LEFT breast.

EXAM:
DIGITAL DIAGNOSTIC UNILATERAL LEFT MAMMOGRAM WITH TOMOSYNTHESIS AND
CAD; ULTRASOUND LEFT BREAST LIMITED
TECHNIQUE: Left digital diagnostic mammography and breast tomosynthesis was
performed. The images were evaluated with computer-aided detection.;
Targeted ultrasound examination of the left breast was performed.

[L CC synth-2D]
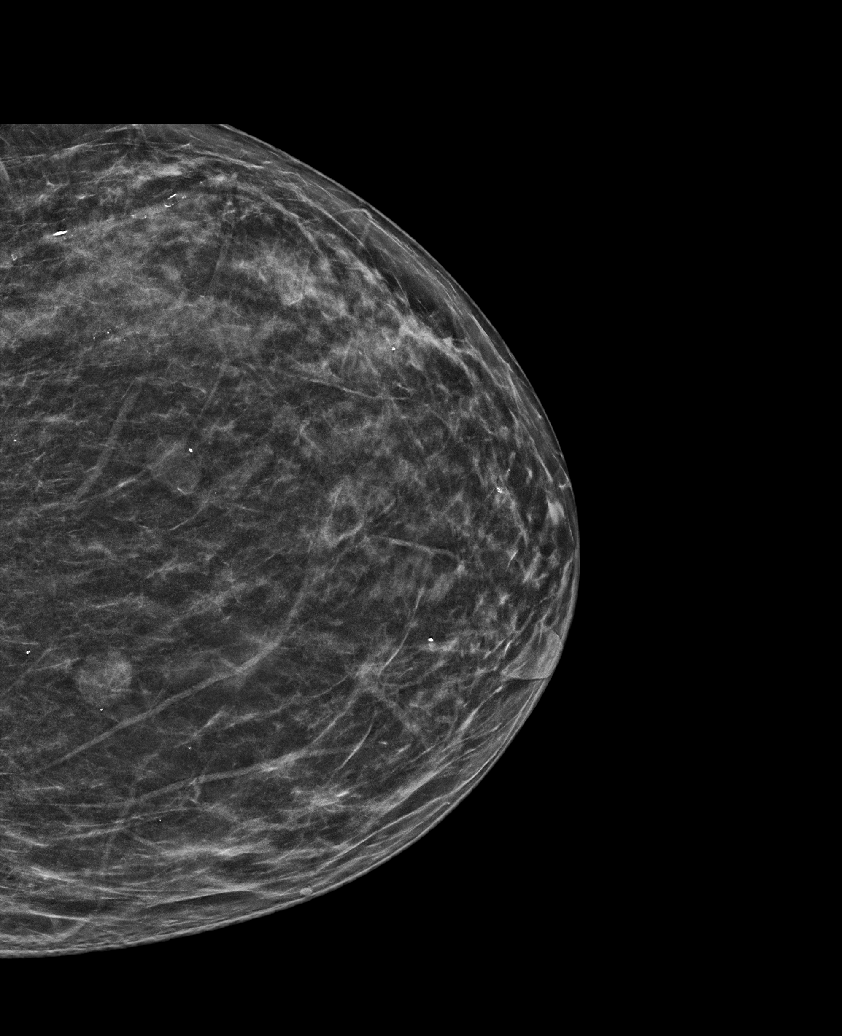

[L MLO synth-2D (1 of 2)]
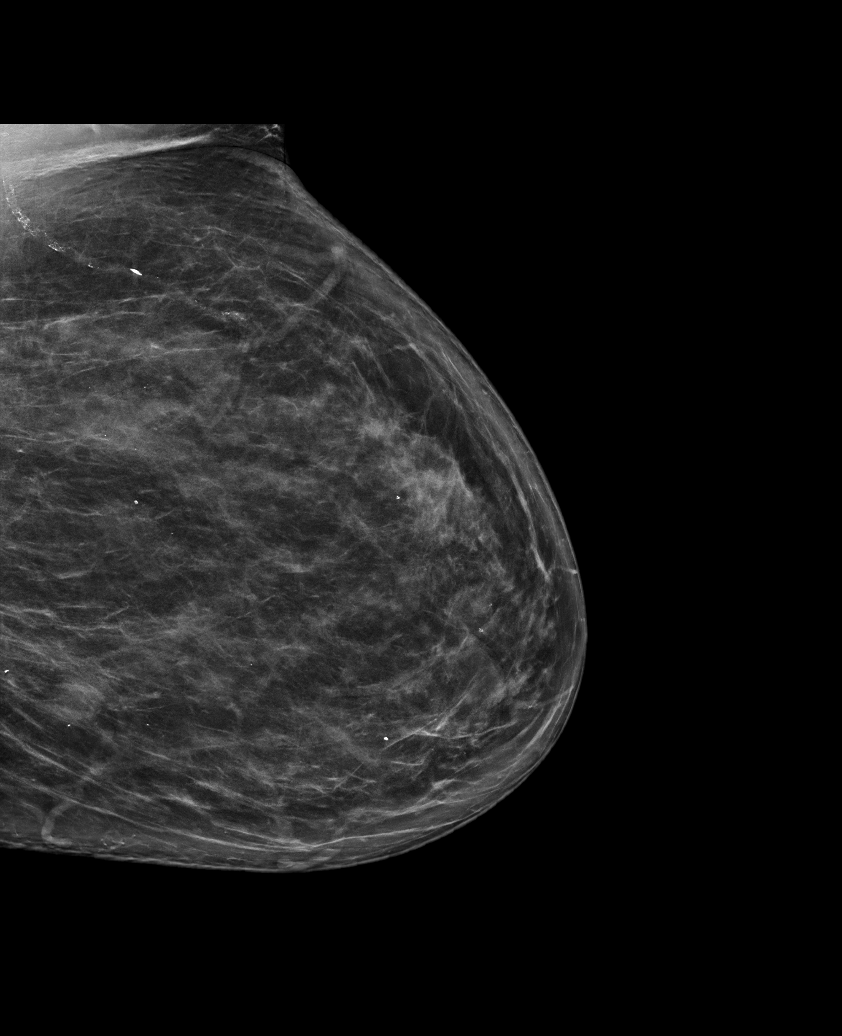

[L MLO synth-2D (2 of 2)]
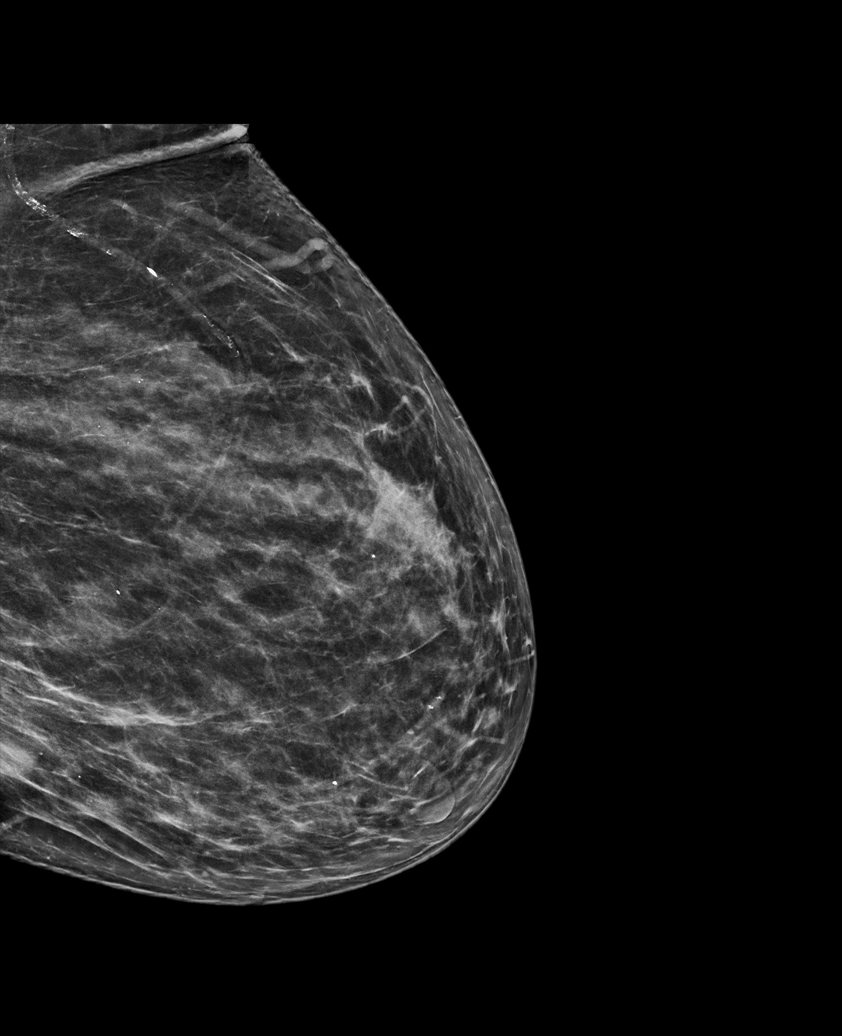

[L CC tomo · tomo slice 32/63.0]
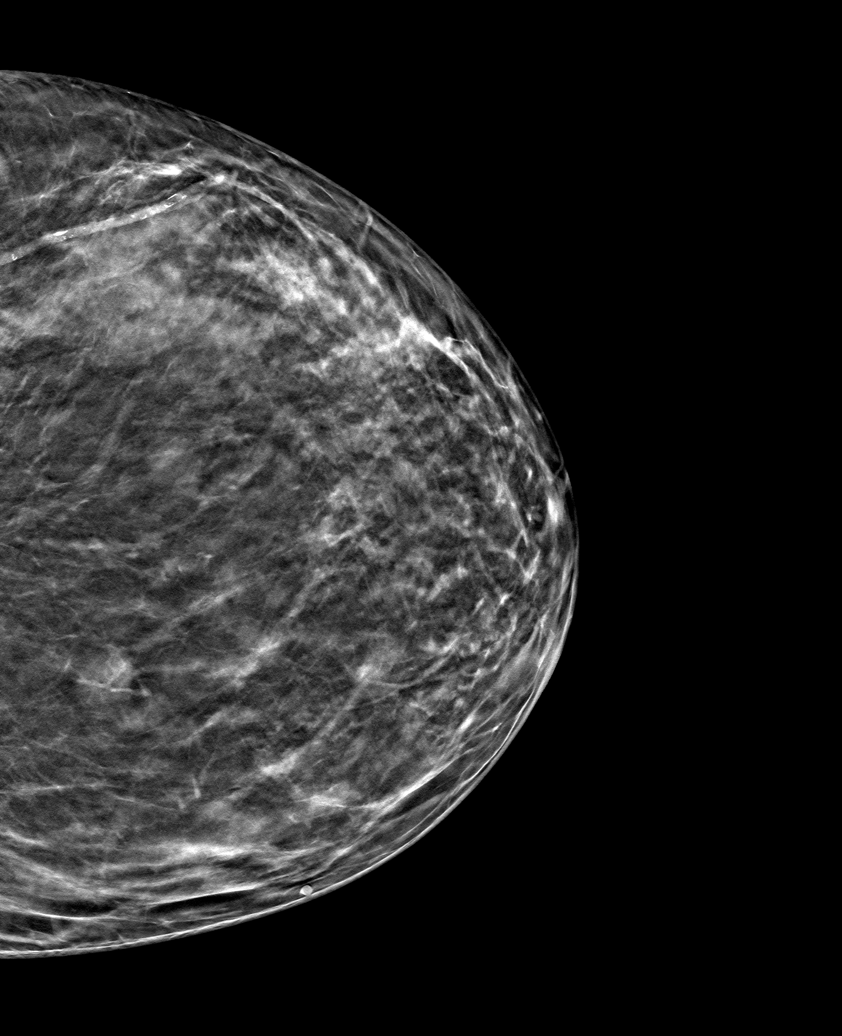

[L MLO tomo (1 of 2) · tomo slice 39/76.0]
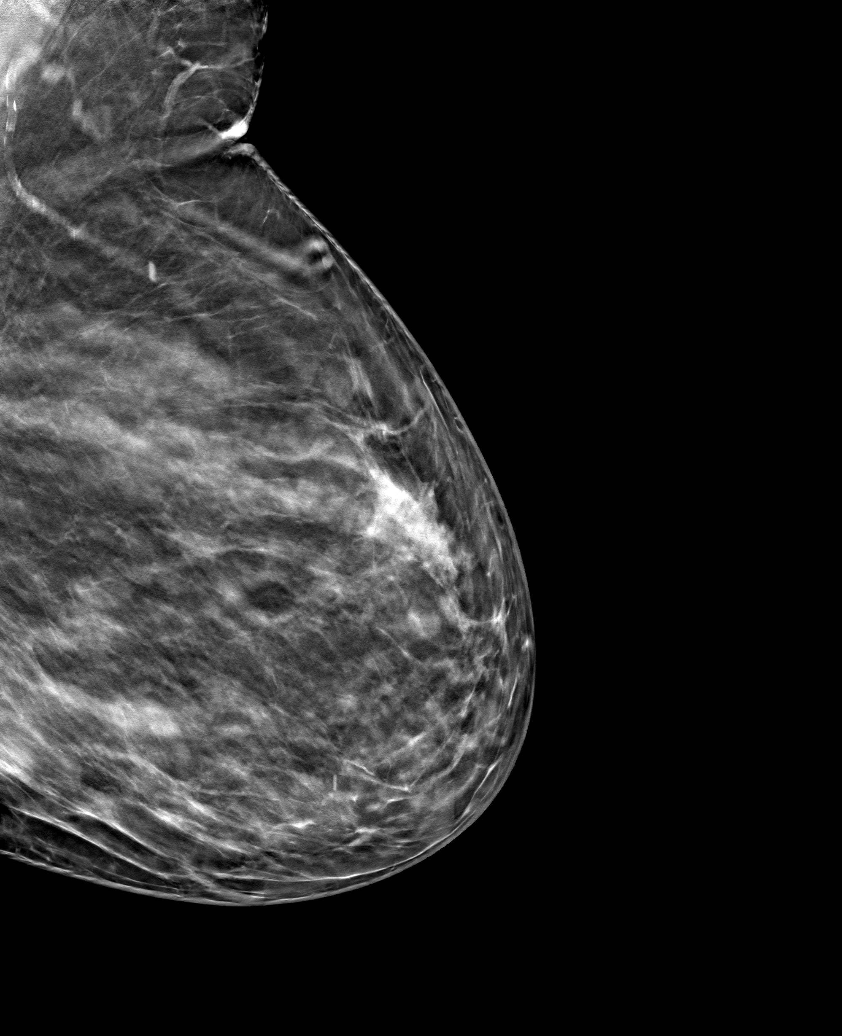

[L MLO tomo (2 of 2) · tomo slice 43/84.0]
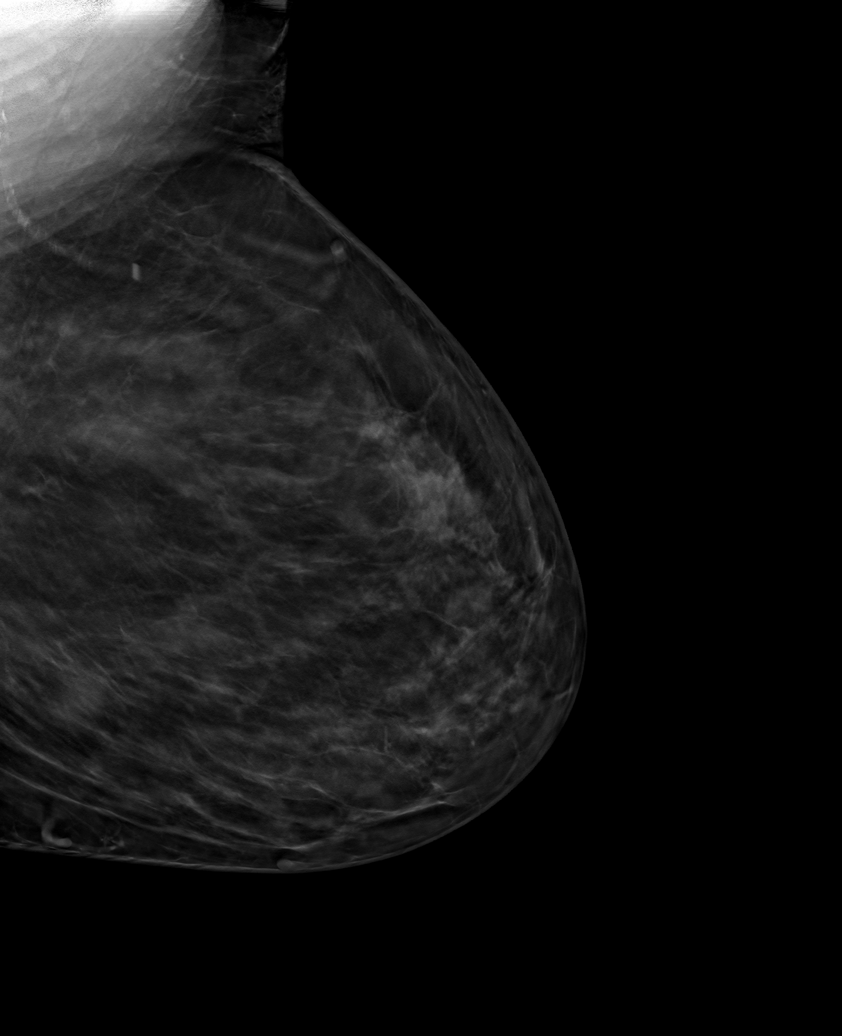

[6 of 18 positions shown; findings below may reference images not displayed]

ACR Breast Density Category b: There are scattered areas of
fibroglandular density.
FINDINGS: Full field views of the LEFT breast again demonstrate circumscribed
oval masses within the LOWER LEFT breast. No new or suspicious
findings are noted.

Targeted ultrasound of the LEFT is performed, showing multiple
collections within the skin as follows:

A 0.4 x 0.1 x 0.3 cm collection at the 4 o'clock position 12 cm from
the nipple

A 1.8 x 0.7 x 1.4 cm collection at the 5 o'clock position 8 cm from
the nipple.

A 2.4 x 1.6 x 2.2 cm collection at the [DATE] position 14 cm from the
nipple, similar to 01/26/2019.
IMPRESSION: 1. Three superficial/dermal collections within the LEFT breast as
described above corresponding to the patient's palpable
abnormalities and compatible with epidermoid/sebaceous cysts.
2. No evidence of LEFT breast malignancy.

RECOMMENDATION:
Consider clinical follow-up as indicated. Any further workup should
be based on clinical grounds.

Bilateral screening mammogram in 4-5 months to resume annual
mammogram schedule.

I have discussed the findings and recommendations with the patient.
If applicable, a reminder letter will be sent to the patient
regarding the next appointment.

BI-RADS CATEGORY  2: Benign.

## 2023-12-02 ENCOUNTER — Other Ambulatory Visit (HOSPITAL_COMMUNITY): Payer: Self-pay

## 2023-12-06 ENCOUNTER — Other Ambulatory Visit: Payer: Self-pay

## 2023-12-06 ENCOUNTER — Telehealth: Payer: Self-pay | Admitting: Internal Medicine

## 2023-12-06 DIAGNOSIS — M359 Systemic involvement of connective tissue, unspecified: Secondary | ICD-10-CM

## 2023-12-06 DIAGNOSIS — Z79899 Other long term (current) drug therapy: Secondary | ICD-10-CM

## 2023-12-06 DIAGNOSIS — M059 Rheumatoid arthritis with rheumatoid factor, unspecified: Secondary | ICD-10-CM

## 2023-12-06 DIAGNOSIS — Z5181 Encounter for therapeutic drug level monitoring: Secondary | ICD-10-CM

## 2023-12-06 NOTE — Progress Notes (Signed)
Specialty Pharmacy Refill Coordination Note  Kathleen Kane is a 63 y.o. female contacted today regarding refills of specialty medication(s) Tocilizumab (Actemra ACTPen)   Patient requested [name] at [facility] at [place] date: 12/12/23   Medication will be filled on 12/09/23, pending refill approval.

## 2023-12-07 ENCOUNTER — Other Ambulatory Visit (HOSPITAL_COMMUNITY): Payer: Self-pay

## 2023-12-07 ENCOUNTER — Other Ambulatory Visit: Payer: Self-pay

## 2023-12-07 MED ORDER — ACTEMRA ACTPEN 162 MG/0.9ML ~~LOC~~ SOAJ
162.0000 mg | SUBCUTANEOUS | 2 refills | Status: DC
  Filled 2023-12-07: qty 3.6, 28d supply, fill #0
  Filled 2023-12-29: qty 3.6, 28d supply, fill #1
  Filled 2024-01-30: qty 3.6, 28d supply, fill #2

## 2023-12-07 NOTE — Telephone Encounter (Signed)
Refill sent for ACTEMRA SQ to [facility]: [phone]   Dose: 162mg  SQ every 7 days  Last OV: 05/23/23 Provider: Dr. Marchelle Gearing Pertinent labs: LFTs on 11/14/23 wnl, TB gold negative on 11/14/23  Next OV: overdue  Routing to scheduling team for follow-up on appt scheduling  Chesley Mires, PharmD, MPH, BCPS Clinical Pharmacist (Rheumatology and Pulmonology)

## 2023-12-09 ENCOUNTER — Other Ambulatory Visit: Payer: Self-pay

## 2023-12-20 NOTE — Progress Notes (Signed)
CARDIOLOGY OFFICE NOTE  Date:  12/30/2023    [name] Date of Birth: Dec 12, 1960 Medical Record #[id number]  PCP:  Fleet Contras, MD  Cardiologist:  Townsend Roger    No chief complaint on file.   History of Present Illness: Kathleen Kane is a 63 y.o. female who presents today for a follow up visit.   She has a history of persistent AF - on Eliquis, diastolic HF, OSA (does not use CPAP), HTN, DM, RA, obesity and prior tobacco abuse. Normal Myoview from 2014. Activity limited by RA. 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 DM poorly controlled A1c 8.0   Updated TTE 04/22/22 only mild MR and EF 60-65%   On chronic steroids since 2015 seen by pulmonary and has ILD associated with RA. She has failed other biologic agents with infections/intolerance Started on Actemra  CT 04/19/22 confirmed ILD Activity is limited by this and arthritis   Got azotemic with addition of zaroxyln for edema told to take only PRN in addition to her lasix 40 mg daily Most recent CT showed moderate upper lobe ILD No PE  BNP upper normal at 293 BUN 40 Cr 1.3 when zaroxyln stopped   Long discussion with her with ILD, autoimmune dx that she has not had any vaccines for Flu, COVID or RSV  Seen in ED 10/20/23 after mechanical falls. Head/Cervical spine films negative Was hypokalemic but had not been taking her supplemental K Repeat BMET 11/14/23 K 4.3 Cr 1.59 BUN 38    Past Medical History:  Diagnosis Date   Acute on chronic diastolic CHF (congestive heart failure) (HCC) 08/08/2015   Anxiety    Atrial fibrillation, persistent (HCC)    Carpal tunnel syndrome, right 12/15/2016   CHF (congestive heart failure) (HCC) 2013   No echo found from that time, most likely diastolic   CHF exacerbation (HCC) 10/01/2018   Depression    Diabetes mellitus (HCC) 08/10/2012    Diabetes mellitus without complication (HCC)    Hypertension    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 AHI-6.8/hr, during REM-27.3/hr. RDI-25.0/hr, during REM-40.9/hr. 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   HAND SURGERY Right 2018   LAPAROTOMY Right 02/27/2013   Procedure: EXPLORATORY LAPAROTOMY, right salpingo-oopherectomy;  Surgeon: Laurette Schimke, MD;  Location: [facility];  Service: Gynecology;  Laterality: Right;   left breast cyst     SALPINGOOPHORECTOMY Right 02/27/2013   Procedure: SALPINGO OOPHORECTOMY;  Surgeon: Laurette Schimke, MD;  Location: [facility];  Service: Gynecology;  Laterality: Right;   TUBAL LIGATION Bilateral 1989  Medications: Current Meds  Medication Sig   Accu-Chek FastClix Lancets MISC Apply topically.   ACCU-CHEK GUIDE test strip    atorvastatin (LIPITOR) 20 MG tablet TAKE 1 TABLET (20 MG TOTAL) BY MOUTH DAILY. PLEASE SCHEDULE YEARLY APPOINTMENT FOR FUTURE REFILLS. 1ST ATTEMPT. THANK YOU   B-D ULTRAFINE III SHORT PEN 31G X 8 MM MISC Inject into the skin 2 (two) times daily.   blood glucose meter kit and supplies Dispense based on patient and insurance preference. Use up to four times daily as directed. (FOR ICD-10 E10.9, E11.9).    diazepam (VALIUM) 5 MG tablet Take 1 tablet (5 mg total) by mouth every 6 (six) hours as needed for muscle spasms.   ELIQUIS 5 MG TABS tablet TAKE 1 TABLET BY MOUTH TWICE A DAY   ferrous sulfate 325 (65 FE) MG tablet Take 1 tablet (325 mg total) by mouth daily with breakfast.   folic acid (FOLVITE) 1 MG tablet Take 1 mg by mouth every morning.   furosemide (LASIX) 40 MG tablet Take 1 tablet (40 mg total) by mouth daily.   levalbuterol (XOPENEX HFA) 45 MCG/ACT inhaler Inhale 1-2 puffs into the lungs every 6 (six) hours as needed for wheezing.   losartan (COZAAR) 25 MG tablet TAKE 1 TABLET BY MOUTH EVERY DAY   metFORMIN (GLUCOPHAGE) 500 MG tablet Take 2 tablets (1,000 mg total) by mouth 2 (two) times daily.   metolazone (ZAROXOLYN) 5 MG tablet TAKE 1 TABLET EVERY OTHER DAY. TAKE 30 MINUTES PRIOR TO TAKING FUROSEMIDE ( LASIX )   metoprolol tartrate (LOPRESSOR) 25 MG tablet TAKE 1 TABLET BY MOUTH TWICE A DAY   NOVOLOG MIX 70/30 FLEXPEN (70-30) 100 UNIT/ML FlexPen Inject 0.4 mLs (40 Units total) into the skin 2 (two) times daily with a meal.   potassium chloride (KLOR-CON M) 10 MEQ tablet Take 1 tablet (10 mEq total) by mouth 2 (two) times daily.   predniSONE (DELTASONE) 10 MG tablet TAKE 1 TABLET BY MOUTH EVERY DAY   predniSONE (STERAPRED UNI-PAK 48 TAB) 10 MG (48) TBPK tablet Take as directed.   spironolactone (ALDACTONE) 25 MG tablet Take 0.5 tablets (12.5 mg total) by mouth daily.   sulfamethoxazole-trimethoprim (BACTRIM DS) 800-160 MG tablet TAKE 1 TABLET BY MOUTH 3 (THREE) TIMES A WEEK. (MON, WED, FRI)   Tocilizumab (ACTEMRA ACTPEN) 162 MG/0.9ML SOAJ Inject 162 mg into the skin every 7 (seven) days.     Allergies: Allergies  Allergen Reactions   Ace Inhibitors Cough   Lisinopril Cough    Social History: The patient  reports that she quit smoking about 9 years ago. Her smoking use included cigarettes. She started smoking about 47 years ago. She has a 37.6 pack-year smoking history. She  has never been exposed to tobacco smoke. She has never used smokeless tobacco. She reports that she does not drink alcohol and does not use drugs.   Family History: The patient's family history includes Autoimmune disease in her daughter; Diabetes in her brother, mother, paternal grandmother, and sister; Healthy in her son; Heart Problems in her sister; Hypertension in her brother, father, and mother; Kidney failure in her sister; Throat cancer in her mother.   Review of Systems: Please see the history of present illness.   All other systems are reviewed and negative.   Physical Exam: VS:  BP 122/76   Pulse 76   Ht 5\' 3"  (1.6 m)   Wt 255 lb 3.2 oz (115.8 kg)   LMP 10/28/2013   SpO2 92%  BMI 45.21 kg/m  .  BMI Body mass index is 45.21 kg/m.  Wt Readings from Last 3 Encounters:  12/30/23 255 lb 3.2 oz (115.8 kg)  05/23/23 238 lb (108 kg)  04/16/23 239 lb (108.4 kg)   Affect appropriate Overweight black female  HEENT: normal Neck supple with no adenopathy JVP normal no bruits no thyromegaly Lungs clear with no wheezing and good diaphragmatic motion Heart:  S1/S2 apical MR  murmur, no rub, gallop or click PMI normal Abdomen: benighn, BS positve, no tenderness, no AAA no bruit.  No HSM or HJR Distal pulses intact with no bruits Trace bilateral edema Neuro non-focal Skin warm and dry No muscular weakness    LABORATORY DATA:  EKG:  EKG is ordered today.  Personally reviewed by me. This demonstrates AF with controlled VR - HR is 87.  Lab Results  Component Value Date   WBC 6.6 12/29/2018   HGB 12.9 12/29/2018   HCT 45.7 12/29/2018   PLT 291 12/29/2018   GLUCOSE 140 (H) 12/29/2018   CHOL  01/24/2008    155        ATP III CLASSIFICATION:  <200     mg/dL   Desirable  454-098  mg/dL   Borderline High  >=119    mg/dL   High   TRIG 147 82/95/6213   HDL 30 (L) 01/24/2008   LDLCALC  01/24/2008    97        Total Cholesterol/HDL:CHD Risk Coronary Heart Disease Risk  Table                     Men   Women  1/2 Average Risk   3.4   3.3   ALT 22 12/29/2018   AST 23 12/29/2018   NA 138 12/29/2018   K 3.7 12/29/2018   CL 105 12/29/2018   CREATININE 0.85 12/29/2018   BUN 20 12/29/2018   CO2 19 (L) 12/29/2018   TSH 1.162 Test methodology is 3rd generation TSH 01/23/2008   INR 1.08 01/10/2016   HGBA1C 8.5 (H) 10/02/2018     BNP (last 3 results) No results for input(s): "BNP" in the last 8760 hours.   ProBNP (last 3 results) Recent Labs    03/01/23 1021  PROBNP 211.0*     Other Studies Reviewed Today:  TTE 04/22/22  IMPRESSIONS     1. Left ventricular ejection fraction, by estimation, is 60 to 65%. The  left ventricle has normal function. The left ventricle has no regional  wall motion abnormalities. There is moderate concentric left ventricular  hypertrophy. Left ventricular  diastolic parameters are indeterminate.   2. Right ventricular systolic function is normal. The right ventricular  size is normal.   3. Left atrial size was moderately dilated.   4. The mitral valve is normal in structure. Mild mitral valve  regurgitation.   5. The aortic valve is normal in structure. Aortic valve regurgitation is  not visualized.   6. The inferior vena cava is normal in size with greater than 50%  respiratory variability, suggesting right atrial pressure of 3 mmHg.    Assessment/Plan:  1. 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 HLD -  LDL 86 continue lipitor   4. Chronic diastolic dysfunction - BP is ok.  Echo 04/22/22 EF 60-65% mild LVH diastolic indeterminate due to afib  BNP not elevated  Continue  lasix 40 mg and aldactone 12.5 mg Zaroxyln d/c due to azotemia only to be used PRN   5. Valvular heart disease - mild  MR by TTE 04/22/22   6. DM - poorly controlled A1c 8.0 f/u primary   7.  RA/ILD:  chronic steroids prednisone 10 mg  CT confirmed ESR 39 needs to f/u with pulmonary /rheumatology Now on Actemra   F/U Dr Marchelle Gearing pulonary   Disposition:   FU with [place] in a year      Patient is agreeable to this plan and will call if any problems develop in the interim.   Signed: Charlton Haws, MD  12/30/2023 9:03 AM  [facility] [address] [place], [place]  [address] Phone: [phone] Fax: [phone]

## 2023-12-28 ENCOUNTER — Other Ambulatory Visit: Payer: Self-pay

## 2023-12-29 ENCOUNTER — Other Ambulatory Visit (HOSPITAL_COMMUNITY): Payer: Self-pay

## 2023-12-29 ENCOUNTER — Other Ambulatory Visit: Payer: Self-pay | Admitting: Pharmacy Technician

## 2023-12-29 ENCOUNTER — Other Ambulatory Visit: Payer: Self-pay

## 2023-12-29 NOTE — Progress Notes (Signed)
Specialty Pharmacy Refill Coordination Note  Kathleen Kane is a 63 y.o. female contacted today regarding refills of specialty medication(s) Tocilizumab (Actemra ACTPen)   Patient requested [name] at [facility] at [place] date: 01/04/24   Medication will be filled on 01/03/24.

## 2023-12-30 ENCOUNTER — Ambulatory Visit: Payer: Medicaid Other | Attending: Cardiovascular Disease | Admitting: Cardiovascular Disease

## 2023-12-30 ENCOUNTER — Encounter: Payer: Self-pay | Admitting: Cardiovascular Disease

## 2023-12-30 VITALS — BP 122/76 | HR 76 | Ht 63.0 in | Wt 255.2 lb

## 2023-12-30 DIAGNOSIS — I10 Essential (primary) hypertension: Secondary | ICD-10-CM

## 2023-12-30 DIAGNOSIS — I5033 Acute on chronic diastolic (congestive) heart failure: Secondary | ICD-10-CM

## 2023-12-30 DIAGNOSIS — E785 Hyperlipidemia, unspecified: Secondary | ICD-10-CM | POA: Diagnosis not present

## 2023-12-30 NOTE — Patient Instructions (Signed)

## 2024-01-02 ENCOUNTER — Encounter (HOSPITAL_COMMUNITY): Payer: Self-pay

## 2024-01-02 ENCOUNTER — Other Ambulatory Visit: Payer: Self-pay

## 2024-01-02 ENCOUNTER — Emergency Department (HOSPITAL_COMMUNITY): Payer: Medicaid Other

## 2024-01-02 ENCOUNTER — Emergency Department (HOSPITAL_COMMUNITY)
Admission: EM | Admit: 2024-01-02 | Discharge: 2024-01-02 | Discharge status: Against Medical Advice | Payer: Medicaid Other | Attending: Emergency Medicine | Admitting: Emergency Medicine

## 2024-01-02 DIAGNOSIS — Z5321 Procedure and treatment not carried out due to patient leaving prior to being seen by health care provider: Secondary | ICD-10-CM | POA: Insufficient documentation

## 2024-01-02 DIAGNOSIS — R059 Cough, unspecified: Secondary | ICD-10-CM | POA: Diagnosis present

## 2024-01-02 LAB — CBC WITH DIFFERENTIAL/PLATELET
Abs Immature Granulocytes: 0.02 10*3/uL (ref 0.00–0.07)
Basophils Absolute: 0 10*3/uL (ref 0.0–0.1)
Basophils Relative: 0 %
Eosinophils Absolute: 0 10*3/uL (ref 0.0–0.5)
Eosinophils Relative: 0 %
HCT: 46 % (ref 36.0–46.0)
Hemoglobin: 15.7 g/dL — ABNORMAL HIGH (ref 12.0–15.0)
Immature Granulocytes: 1 %
Lymphocytes Relative: 13 %
Lymphs Abs: 0.5 10*3/uL — ABNORMAL LOW (ref 0.7–4.0)
MCH: 30.7 pg (ref 26.0–34.0)
MCHC: 34.1 g/dL (ref 30.0–36.0)
MCV: 89.8 fL (ref 80.0–100.0)
Monocytes Absolute: 0.5 10*3/uL (ref 0.1–1.0)
Monocytes Relative: 15 %
Neutro Abs: 2.4 10*3/uL (ref 1.7–7.7)
Neutrophils Relative %: 71 %
Platelets: 208 10*3/uL (ref 150–400)
RBC: 5.12 MIL/uL — ABNORMAL HIGH (ref 3.87–5.11)
RDW: 13.2 % (ref 11.5–15.5)
WBC: 3.4 10*3/uL — ABNORMAL LOW (ref 4.0–10.5)
nRBC: 0 % (ref 0.0–0.2)

## 2024-01-02 LAB — COMPREHENSIVE METABOLIC PANEL
ALT: 37 U/L (ref 0–44)
AST: 30 U/L (ref 15–41)
Albumin: 4.4 g/dL (ref 3.5–5.0)
Alkaline Phosphatase: 50 U/L (ref 38–126)
Anion gap: 18 — ABNORMAL HIGH (ref 5–15)
BUN: 13 mg/dL (ref 8–23)
CO2: 27 mmol/L (ref 22–32)
Calcium: 10.5 mg/dL — ABNORMAL HIGH (ref 8.9–10.3)
Chloride: 92 mmol/L — ABNORMAL LOW (ref 98–111)
Creatinine, Ser: 1.44 mg/dL — ABNORMAL HIGH (ref 0.44–1.00)
GFR, Estimated: 41 mL/min — ABNORMAL LOW (ref >60)
Glucose, Bld: 181 mg/dL — ABNORMAL HIGH (ref 70–99)
Potassium: 2.9 mmol/L — ABNORMAL LOW (ref 3.5–5.1)
Sodium: 137 mmol/L (ref 135–145)
Total Bilirubin: 1.3 mg/dL — ABNORMAL HIGH (ref 0.0–1.2)
Total Protein: 7.6 g/dL (ref 6.5–8.1)

## 2024-01-02 MED ORDER — ALBUTEROL SULFATE HFA 108 (90 BASE) MCG/ACT IN AERS
2.0000 | INHALATION_SPRAY | RESPIRATORY_TRACT | Status: DC | PRN
Start: 2024-01-02 — End: 2024-01-02

## 2024-01-02 NOTE — ED Triage Notes (Addendum)
 Patient has had a cough for 1 month along with wheezing. Is coughing up white mucus. Has scarring of the lungs. Denies pain. Wants to be checked for heart failure.

## 2024-01-05 ENCOUNTER — Other Ambulatory Visit: Payer: Self-pay

## 2024-01-05 ENCOUNTER — Emergency Department (HOSPITAL_COMMUNITY): Payer: Medicaid Other

## 2024-01-05 ENCOUNTER — Inpatient Hospital Stay (HOSPITAL_COMMUNITY)
Admission: EM | Admit: 2024-01-05 | Discharge: 2024-01-06 | DRG: 641 | Disposition: A | Discharge status: Home / Self Care | Payer: Medicaid Other | Attending: Family Medicine | Admitting: Family Medicine

## 2024-01-05 DIAGNOSIS — E861 Hypovolemia: Secondary | ICD-10-CM | POA: Diagnosis present

## 2024-01-05 DIAGNOSIS — I5032 Chronic diastolic (congestive) heart failure: Secondary | ICD-10-CM | POA: Diagnosis present

## 2024-01-05 DIAGNOSIS — I4821 Permanent atrial fibrillation: Secondary | ICD-10-CM | POA: Diagnosis present

## 2024-01-05 DIAGNOSIS — N1832 Chronic kidney disease, stage 3b: Secondary | ICD-10-CM | POA: Diagnosis present

## 2024-01-05 DIAGNOSIS — Z87891 Personal history of nicotine dependence: Secondary | ICD-10-CM

## 2024-01-05 DIAGNOSIS — F32A Depression, unspecified: Secondary | ICD-10-CM | POA: Diagnosis present

## 2024-01-05 DIAGNOSIS — E1122 Type 2 diabetes mellitus with diabetic chronic kidney disease: Secondary | ICD-10-CM | POA: Diagnosis present

## 2024-01-05 DIAGNOSIS — E669 Obesity, unspecified: Secondary | ICD-10-CM | POA: Diagnosis present

## 2024-01-05 DIAGNOSIS — Z8249 Family history of ischemic heart disease and other diseases of the circulatory system: Secondary | ICD-10-CM | POA: Diagnosis not present

## 2024-01-05 DIAGNOSIS — Z7984 Long term (current) use of oral hypoglycemic drugs: Secondary | ICD-10-CM | POA: Diagnosis not present

## 2024-01-05 DIAGNOSIS — E785 Hyperlipidemia, unspecified: Secondary | ICD-10-CM | POA: Diagnosis present

## 2024-01-05 DIAGNOSIS — Z7901 Long term (current) use of anticoagulants: Secondary | ICD-10-CM | POA: Diagnosis not present

## 2024-01-05 DIAGNOSIS — J101 Influenza due to other identified influenza virus with other respiratory manifestations: Secondary | ICD-10-CM | POA: Diagnosis present

## 2024-01-05 DIAGNOSIS — E876 Hypokalemia: Secondary | ICD-10-CM | POA: Diagnosis present

## 2024-01-05 DIAGNOSIS — I130 Hypertensive heart and chronic kidney disease with heart failure and stage 1 through stage 4 chronic kidney disease, or unspecified chronic kidney disease: Secondary | ICD-10-CM | POA: Diagnosis present

## 2024-01-05 DIAGNOSIS — E860 Dehydration: Secondary | ICD-10-CM | POA: Diagnosis present

## 2024-01-05 DIAGNOSIS — R0902 Hypoxemia: Secondary | ICD-10-CM | POA: Diagnosis present

## 2024-01-05 DIAGNOSIS — Z794 Long term (current) use of insulin: Secondary | ICD-10-CM

## 2024-01-05 DIAGNOSIS — J84112 Idiopathic pulmonary fibrosis: Secondary | ICD-10-CM | POA: Diagnosis present

## 2024-01-05 DIAGNOSIS — E878 Other disorders of electrolyte and fluid balance, not elsewhere classified: Secondary | ICD-10-CM | POA: Diagnosis present

## 2024-01-05 DIAGNOSIS — Z79899 Other long term (current) drug therapy: Secondary | ICD-10-CM

## 2024-01-05 DIAGNOSIS — Z833 Family history of diabetes mellitus: Secondary | ICD-10-CM

## 2024-01-05 DIAGNOSIS — N179 Acute kidney failure, unspecified: Secondary | ICD-10-CM | POA: Diagnosis present

## 2024-01-05 DIAGNOSIS — Z841 Family history of disorders of kidney and ureter: Secondary | ICD-10-CM

## 2024-01-05 DIAGNOSIS — F419 Anxiety disorder, unspecified: Secondary | ICD-10-CM | POA: Diagnosis present

## 2024-01-05 DIAGNOSIS — D509 Iron deficiency anemia, unspecified: Secondary | ICD-10-CM | POA: Diagnosis present

## 2024-01-05 DIAGNOSIS — M1A09X1 Idiopathic chronic gout, multiple sites, with tophus (tophi): Secondary | ICD-10-CM | POA: Diagnosis present

## 2024-01-05 DIAGNOSIS — J9601 Acute respiratory failure with hypoxia: Secondary | ICD-10-CM | POA: Diagnosis not present

## 2024-01-05 DIAGNOSIS — E871 Hypo-osmolality and hyponatremia: Secondary | ICD-10-CM | POA: Diagnosis present

## 2024-01-05 DIAGNOSIS — G4733 Obstructive sleep apnea (adult) (pediatric): Secondary | ICD-10-CM | POA: Diagnosis present

## 2024-01-05 DIAGNOSIS — M069 Rheumatoid arthritis, unspecified: Secondary | ICD-10-CM | POA: Diagnosis present

## 2024-01-05 DIAGNOSIS — Z1152 Encounter for screening for COVID-19: Secondary | ICD-10-CM

## 2024-01-05 DIAGNOSIS — I4510 Unspecified right bundle-branch block: Secondary | ICD-10-CM | POA: Diagnosis present

## 2024-01-05 DIAGNOSIS — Z888 Allergy status to other drugs, medicaments and biological substances status: Secondary | ICD-10-CM

## 2024-01-05 DIAGNOSIS — Z7952 Long term (current) use of systemic steroids: Secondary | ICD-10-CM

## 2024-01-05 DIAGNOSIS — Z808 Family history of malignant neoplasm of other organs or systems: Secondary | ICD-10-CM

## 2024-01-05 LAB — COMPREHENSIVE METABOLIC PANEL
ALT: 39 U/L (ref 0–44)
AST: 55 U/L — ABNORMAL HIGH (ref 15–41)
Albumin: 3.9 g/dL (ref 3.5–5.0)
Alkaline Phosphatase: 46 U/L (ref 38–126)
Anion gap: 16 — ABNORMAL HIGH (ref 5–15)
BUN: 32 mg/dL — ABNORMAL HIGH (ref 8–23)
CO2: 26 mmol/L (ref 22–32)
Calcium: 8.5 mg/dL — ABNORMAL LOW (ref 8.9–10.3)
Chloride: 82 mmol/L — ABNORMAL LOW (ref 98–111)
Creatinine, Ser: 2.06 mg/dL — ABNORMAL HIGH (ref 0.44–1.00)
GFR, Estimated: 26 mL/min — ABNORMAL LOW (ref >60)
Glucose, Bld: 212 mg/dL — ABNORMAL HIGH (ref 70–99)
Potassium: 2.9 mmol/L — ABNORMAL LOW (ref 3.5–5.1)
Sodium: 124 mmol/L — ABNORMAL LOW (ref 135–145)
Total Bilirubin: 1.4 mg/dL — ABNORMAL HIGH (ref 0.0–1.2)
Total Protein: 6.8 g/dL (ref 6.5–8.1)

## 2024-01-05 LAB — BASIC METABOLIC PANEL
Anion gap: 14 (ref 5–15)
BUN: 32 mg/dL — ABNORMAL HIGH (ref 8–23)
CO2: 27 mmol/L (ref 22–32)
Calcium: 8.7 mg/dL — ABNORMAL LOW (ref 8.9–10.3)
Chloride: 85 mmol/L — ABNORMAL LOW (ref 98–111)
Creatinine, Ser: 1.93 mg/dL — ABNORMAL HIGH (ref 0.44–1.00)
GFR, Estimated: 29 mL/min — ABNORMAL LOW (ref >60)
Glucose, Bld: 288 mg/dL — ABNORMAL HIGH (ref 70–99)
Potassium: 3.5 mmol/L (ref 3.5–5.1)
Sodium: 126 mmol/L — ABNORMAL LOW (ref 135–145)

## 2024-01-05 LAB — CBC
HCT: 43.3 % (ref 36.0–46.0)
Hemoglobin: 14.8 g/dL (ref 12.0–15.0)
MCH: 30.5 pg (ref 26.0–34.0)
MCHC: 34.2 g/dL (ref 30.0–36.0)
MCV: 89.1 fL (ref 80.0–100.0)
Platelets: 124 10*3/uL — ABNORMAL LOW (ref 150–400)
RBC: 4.86 MIL/uL (ref 3.87–5.11)
RDW: 13.3 % (ref 11.5–15.5)
WBC: 6.1 10*3/uL (ref 4.0–10.5)
nRBC: 0 % (ref 0.0–0.2)

## 2024-01-05 LAB — BLOOD GAS, VENOUS
Acid-Base Excess: 6 mmol/L — ABNORMAL HIGH (ref 0.0–2.0)
Bicarbonate: 31.2 mmol/L — ABNORMAL HIGH (ref 20.0–28.0)
O2 Saturation: 62 %
Patient temperature: 37
pCO2, Ven: 46 mm[Hg] (ref 44–60)
pH, Ven: 7.44 — ABNORMAL HIGH (ref 7.25–7.43)
pO2, Ven: 38 mm[Hg] (ref 32–45)

## 2024-01-05 LAB — GLUCOSE, CAPILLARY
Glucose-Capillary: 199 mg/dL — ABNORMAL HIGH (ref 70–99)
Glucose-Capillary: 291 mg/dL — ABNORMAL HIGH (ref 70–99)
Glucose-Capillary: 190 mg/dL — ABNORMAL HIGH (ref 70–99)

## 2024-01-05 LAB — RESP PANEL BY RT-PCR (RSV, FLU A&B, COVID)  RVPGX2
Influenza A by PCR: POSITIVE — AB
Influenza B by PCR: NEGATIVE
Resp Syncytial Virus by PCR: NEGATIVE
SARS Coronavirus 2 by RT PCR: NEGATIVE

## 2024-01-05 LAB — TROPONIN I (HIGH SENSITIVITY)
Troponin I (High Sensitivity): 68 ng/L — ABNORMAL HIGH (ref <18)
Troponin I (High Sensitivity): 63 ng/L — ABNORMAL HIGH (ref <18)

## 2024-01-05 LAB — OSMOLALITY: Osmolality: 283 mosm/kg (ref 275–295)

## 2024-01-05 LAB — HIV ANTIBODY (ROUTINE TESTING W REFLEX): HIV Screen 4th Generation wRfx: NONREACTIVE

## 2024-01-05 LAB — MAGNESIUM: Magnesium: 1.7 mg/dL (ref 1.7–2.4)

## 2024-01-05 LAB — LIPASE, BLOOD: Lipase: 31 U/L (ref 11–51)

## 2024-01-05 LAB — BRAIN NATRIURETIC PEPTIDE: B Natriuretic Peptide: 74.6 pg/mL (ref 0.0–100.0)

## 2024-01-05 MED ORDER — LOSARTAN POTASSIUM 25 MG PO TABS: 25.0000 mg | ORAL_TABLET | Freq: Once | ORAL | Status: DC

## 2024-01-05 MED ORDER — INSULIN ASPART PROT & ASPART (70-30 MIX) 100 UNIT/ML PEN: 25.0000 [IU] | PEN_INJECTOR | Freq: Two times a day (BID) | SUBCUTANEOUS | Status: DC

## 2024-01-05 MED ORDER — GUAIFENESIN-DM 100-10 MG/5ML PO SYRP
5.0000 mL | ORAL_SOLUTION | ORAL | Status: DC | PRN
  Administered 2024-01-05 – 2024-01-06 (×3): 5 mL via ORAL
  Filled 2024-01-05 (×3): qty 5

## 2024-01-05 MED ORDER — ACETAMINOPHEN 650 MG RE SUPP: 650.0000 mg | Freq: Four times a day (QID) | RECTAL | Status: DC | PRN

## 2024-01-05 MED ORDER — TRAZODONE HCL 50 MG PO TABS: 50.0000 mg | ORAL_TABLET | Freq: Every evening | ORAL | Status: DC | PRN

## 2024-01-05 MED ORDER — LACTATED RINGERS IV SOLN: INTRAVENOUS | Status: AC

## 2024-01-05 MED ORDER — LEVALBUTEROL HCL 0.63 MG/3ML IN NEBU: 0.6300 mg | INHALATION_SOLUTION | Freq: Four times a day (QID) | RESPIRATORY_TRACT | Status: DC | PRN

## 2024-01-05 MED ORDER — APIXABAN 5 MG PO TABS
5.0000 mg | ORAL_TABLET | Freq: Two times a day (BID) | ORAL | Status: DC
  Administered 2024-01-05 – 2024-01-06 (×3): 5 mg via ORAL
  Filled 2024-01-05 (×3): qty 1

## 2024-01-05 MED ORDER — INSULIN ASPART 100 UNIT/ML IJ SOLN
0.0000 [IU] | Freq: Three times a day (TID) | INTRAMUSCULAR | Status: DC
  Administered 2024-01-05 – 2024-01-06 (×2): 8 [IU] via SUBCUTANEOUS
  Administered 2024-01-06: 5 [IU] via SUBCUTANEOUS
  Filled 2024-01-05: qty 0.15

## 2024-01-05 MED ORDER — ATORVASTATIN CALCIUM 10 MG PO TABS
20.0000 mg | ORAL_TABLET | Freq: Every day | ORAL | Status: DC
  Administered 2024-01-05 – 2024-01-06 (×2): 20 mg via ORAL
  Filled 2024-01-05 (×2): qty 2

## 2024-01-05 MED ORDER — ACETAMINOPHEN 325 MG PO TABS: 650.0000 mg | ORAL_TABLET | Freq: Four times a day (QID) | ORAL | Status: DC | PRN

## 2024-01-05 MED ORDER — LACTATED RINGERS IV BOLUS
500.0000 mL | Freq: Once | INTRAVENOUS | Status: AC
  Administered 2024-01-05: 500 mL via INTRAVENOUS

## 2024-01-05 MED ORDER — ONDANSETRON HCL 4 MG PO TABS: 4.0000 mg | ORAL_TABLET | Freq: Four times a day (QID) | ORAL | Status: DC | PRN

## 2024-01-05 MED ORDER — DIAZEPAM 5 MG PO TABS: 5.0000 mg | ORAL_TABLET | Freq: Four times a day (QID) | ORAL | Status: DC | PRN

## 2024-01-05 MED ORDER — PREDNISONE 10 MG PO TABS
10.0000 mg | ORAL_TABLET | Freq: Every day | ORAL | Status: DC
  Administered 2024-01-05 – 2024-01-06 (×2): 10 mg via ORAL
  Filled 2024-01-05: qty 1
  Filled 2024-01-05: qty 2

## 2024-01-05 MED ORDER — FERROUS SULFATE 325 (65 FE) MG PO TABS
325.0000 mg | ORAL_TABLET | Freq: Every day | ORAL | Status: DC
  Administered 2024-01-06: 325 mg via ORAL
  Filled 2024-01-05: qty 1

## 2024-01-05 MED ORDER — METOPROLOL TARTRATE 25 MG PO TABS
25.0000 mg | ORAL_TABLET | Freq: Once | ORAL | Status: AC
  Administered 2024-01-05: 25 mg via ORAL
  Filled 2024-01-05: qty 1

## 2024-01-05 MED ORDER — INSULIN ASPART PROT & ASPART (70-30 MIX) 100 UNIT/ML ~~LOC~~ SUSP
15.0000 [IU] | Freq: Two times a day (BID) | SUBCUTANEOUS | Status: DC
Start: 2024-01-05 — End: 2024-01-06
  Administered 2024-01-05 – 2024-01-06 (×2): 15 [IU] via SUBCUTANEOUS
  Filled 2024-01-05: qty 10

## 2024-01-05 MED ORDER — INSULIN ASPART 100 UNIT/ML IJ SOLN
0.0000 [IU] | Freq: Every day | INTRAMUSCULAR | Status: DC
  Filled 2024-01-05: qty 0.05

## 2024-01-05 MED ORDER — METOPROLOL TARTRATE 25 MG PO TABS
25.0000 mg | ORAL_TABLET | Freq: Two times a day (BID) | ORAL | Status: DC
  Administered 2024-01-05 – 2024-01-06 (×2): 25 mg via ORAL
  Filled 2024-01-05 (×2): qty 1

## 2024-01-05 MED ORDER — POTASSIUM CHLORIDE CRYS ER 20 MEQ PO TBCR
40.0000 meq | EXTENDED_RELEASE_TABLET | Freq: Once | ORAL | Status: AC
  Administered 2024-01-05: 40 meq via ORAL
  Filled 2024-01-05: qty 2

## 2024-01-05 MED ORDER — ONDANSETRON HCL 4 MG/2ML IJ SOLN: 4.0000 mg | Freq: Four times a day (QID) | INTRAMUSCULAR | Status: DC | PRN

## 2024-01-05 MED ORDER — OSELTAMIVIR PHOSPHATE 30 MG PO CAPS
30.0000 mg | ORAL_CAPSULE | Freq: Two times a day (BID) | ORAL | Status: DC
  Filled 2024-01-05: qty 1

## 2024-01-05 NOTE — ED Triage Notes (Signed)
 Patient arrived with multiple complaints. States she was seen yesterday for a possible kidney infection, complaints of back pain. States her grandkids have the flu and now she has a cough, runny nose, headache, emesis, and decreased appetite. Placed on 2L [place] in triage due to oxygen saturation reading 84% patient states she is not short of breath.

## 2024-01-05 NOTE — H&P (Signed)
 History and Physical  Kathleen Kane NWG:[id number] DOB: 12-25-1960 DOA: 01/05/2024  PCP: Fleet Contras, MD   Chief Complaint: hypoxia   HPI: Kathleen Kane is a 63 y.o. female with medical history significant for permanent A-fib, CHF, IPF, insulin-dependent type 2 diabetes, OSA being admitted to the hospital with acute hypoxic respiratory failure, dehydration and AKI in the setting of influenza A infection.  History is provided by the patient as well as her husband who is at the bedside.  Says that she has been sick with lethargy, cough, lack of appetite for about the last month or so.  In the last month, multiple family members have been sick with the flu.  She has not really been short of breath, but has had a nonproductive cough.  Has also had some nausea with oral intake.  Denies any abdominal pain, diarrhea, or vomiting.  She was seen by her PCP yesterday, diagnosed with a possible UTI though her only symptom was that she was not urinating very much.  In triage, she was saturating 84%, was placed on 2 L nasal cannula oxygen.  Workup as detailed below shows evidence of influenza A, complicated by hyponatremia, AKI.  Review of Systems: Please see HPI for pertinent positives and negatives. A complete 10 system review of systems are otherwise negative.  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 AHI-6.8/hr, during REM-27.3/hr. RDI-25.0/hr, during REM-40.9/hr. 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   Past Surgical History:  Procedure Laterality Date   CARDIOVASCULAR STRESS TEST  02/16/2013   no significant EKG changes with Lexiscan, normal LV function and normal wall function   CESAREAN SECTION     x2   DOPPLER ECHOCARDIOGRAPHY  01/24/2008   EF 55-60%, no diagnostic evidence of LV wall motion abnormalities, LV wall thickness was mild-moderately increased.   HAND SURGERY Right 2018   LAPAROTOMY Right 02/27/2013   Procedure: EXPLORATORY LAPAROTOMY, right salpingo-oopherectomy;  Surgeon: Laurette Schimke, MD;  Location: [facility];  Service: Gynecology;  Laterality: Right;   left breast cyst     SALPINGOOPHORECTOMY Right 02/27/2013   Procedure: SALPINGO OOPHORECTOMY;  Surgeon: Laurette Schimke, MD;  Location: [facility];  Service: Gynecology;  Laterality: Right;   TUBAL LIGATION Bilateral 1989   Social History:  reports that she quit smoking about 9 years ago. Her smoking use included cigarettes. She started smoking about 47 years ago. She has a 37.6 pack-year smoking history. She has never been exposed to tobacco smoke. She has never used smokeless tobacco. She reports that she does not drink alcohol and does not use drugs.  Allergies  Allergen  Reactions   Ace Inhibitors Cough   Lisinopril Cough    Family History  Problem Relation Age of Onset   Hypertension Mother    Diabetes Mother    Throat cancer Mother    Hypertension Father    Diabetes Sister    Heart Problems Sister    Kidney failure Sister    Hypertension Brother    Diabetes Brother    Diabetes Paternal Grandmother    Autoimmune disease Daughter    Healthy Son      Prior to Admission  medications   Medication Sig Start Date End Date Taking? Authorizing Provider  Accu-Chek FastClix Lancets MISC Apply topically. 08/04/20   [provider]  ACCU-CHEK GUIDE test strip  08/14/20   [provider]  atorvastatin (LIPITOR) 20 MG tablet TAKE 1 TABLET (20 MG TOTAL) BY MOUTH DAILY. PLEASE SCHEDULE YEARLY APPOINTMENT FOR FUTURE REFILLS. 1ST ATTEMPT. THANK YOU 01/29/22   Wendall Stade, MD  B-D ULTRAFINE III SHORT PEN 31G X 8 MM MISC Inject into the skin 2 (two) times daily. 06/30/20   [provider]  blood glucose meter kit and supplies Dispense based on patient and insurance preference. Use up to four times daily as directed. (FOR ICD-10 E10.9, E11.9). 10/03/18   Glade Lloyd, MD  diazepam (VALIUM) 5 MG tablet Take 1 tablet (5 mg total) by mouth every 6 (six) hours as needed for muscle spasms. 04/16/23   Lorre Nick, MD  ELIQUIS 5 MG TABS tablet TAKE 1 TABLET BY MOUTH TWICE A DAY 06/13/23   Wendall Stade, MD  ferrous sulfate 325 (65 FE) MG tablet Take 1 tablet (325 mg total) by mouth daily with breakfast. 03/15/22   Marinda Elk, MD  folic acid (FOLVITE) 1 MG tablet Take 1 mg by mouth every morning. 08/19/17   [provider]  furosemide (LASIX) 40 MG tablet Take 1 tablet (40 mg total) by mouth daily. 07/06/22   Carlisle Beers, FNP  levalbuterol [facility] HFA) 45 MCG/ACT inhaler Inhale 1-2 puffs into the lungs every 6 (six) hours as needed for wheezing. 03/19/23   Valinda Hoar, NP  losartan (COZAAR) 25 MG tablet TAKE 1 TABLET BY MOUTH EVERY DAY 10/29/19   Wendall Stade, MD  metFORMIN (GLUCOPHAGE) 500 MG tablet Take 2 tablets (1,000 mg total) by mouth 2 (two) times daily. 10/03/18   Glade Lloyd, MD  metolazone (ZAROXOLYN) 5 MG tablet TAKE 1 TABLET EVERY OTHER DAY. TAKE 30 MINUTES PRIOR TO TAKING FUROSEMIDE ( LASIX ) 11/10/23   Wendall Stade, MD  metoprolol tartrate (LOPRESSOR) 25 MG tablet TAKE 1 TABLET BY MOUTH TWICE A DAY 06/08/23    Wendall Stade, MD  NOVOLOG MIX 70/30 FLEXPEN (70-30) 100 UNIT/ML FlexPen Inject 0.4 mLs (40 Units total) into the skin 2 (two) times daily with a meal. 10/03/18   Glade Lloyd, MD  potassium chloride (KLOR-CON M) 10 MEQ tablet Take 1 tablet (10 mEq total) by mouth 2 (two) times daily. 08/09/23   Wendall Stade, MD  predniSONE (DELTASONE) 10 MG tablet TAKE 1 TABLET BY MOUTH EVERY DAY 08/09/23   Kalman Shan, MD  predniSONE (STERAPRED UNI-PAK 48 TAB) 10 MG (48) TBPK tablet Take as directed. 08/31/23   Mardella Layman, MD  spironolactone (ALDACTONE) 25 MG tablet Take 0.5 tablets (12.5 mg total) by mouth daily. 04/21/22   Swinyer, Zachary George, NP  sulfamethoxazole-trimethoprim (BACTRIM DS) 800-160 MG tablet TAKE 1 TABLET BY MOUTH 3 (THREE) TIMES A WEEK. (MON,  WED, FRI) 10/31/23   Kalman Shan, MD  Tocilizumab (ACTEMRA ACTPEN) 162 MG/0.9ML SOAJ Inject 162 mg into the skin every 7 (seven) days. 12/07/23   Kalman Shan, MD    Physical Exam: BP 130/75   Pulse (!) 101   Temp 98.4 F (36.9 C) (Oral)   Resp 18   LMP 10/28/2013   SpO2 96%  General:  Alert, oriented, calm, in no acute distress, wearing 2 L nasal cannula oxygen.  Husband is at the bedside. Eyes: EOMI, clear conjuctivae, white sclerea Neck: supple, no masses, trachea mildline  Cardiovascular: RRR, no murmurs or rubs, no peripheral edema  Respiratory: clear to auscultation bilaterally, no wheezes, no crackles  Abdomen: soft, nontender, nondistended, normal bowel tones heard  Skin: dry, no rashes  Musculoskeletal: no joint effusions, normal range of motion  Psychiatric: appropriate affect, normal speech  Neurologic: extraocular muscles intact, clear speech, moving all extremities with intact sensorium         Labs on Admission:  Basic Metabolic Panel: Recent Labs  Lab 01/02/24 0939 01/05/24 0845  NA 137 124*  K 2.9* 2.9*  CL 92* 82*  CO2 27 26  GLUCOSE 181* 212*  BUN 13 32*  CREATININE 1.44* 2.06*  CALCIUM  10.5* 8.5*   Liver Function Tests: Recent Labs  Lab 01/02/24 0939 01/05/24 0845  AST 30 55*  ALT 37 39  ALKPHOS 50 46  BILITOT 1.3* 1.4*  PROT 7.6 6.8  ALBUMIN 4.4 3.9   Recent Labs  Lab 01/05/24 0845  LIPASE 31   No results for input(s): "AMMONIA" in the last 168 hours. CBC: Recent Labs  Lab 01/02/24 0939 01/05/24 0845  WBC 3.4* 6.1  NEUTROABS 2.4  --   HGB 15.7* 14.8  HCT 46.0 43.3  MCV 89.8 89.1  PLT 208 124*   Cardiac Enzymes: No results for input(s): "CKTOTAL", "CKMB", "CKMBINDEX", "TROPONINI" in the last 168 hours. BNP (last 3 results) Recent Labs    01/05/24 0845  BNP 74.6    ProBNP (last 3 results) Recent Labs    03/01/23 1021  PROBNP 211.0*    CBG: No results for input(s): "GLUCAP" in the last 168 hours.  Radiological Exams on Admission: DG Chest 2 View Result Date: 01/05/2024 CLINICAL DATA:  63 year old female with shortness of breath, cough, hypoxia. Interstitial lung disease. EXAM: CHEST - 2 VIEW COMPARISON:  CT chest 05/19/2023. Chest radiographs 01/02/2024 and earlier. FINDINGS: PA and lateral views 0736 hours. Stable mild cardiomegaly, mediastinal contours. Stable lung volumes since last year. Coarse bilateral pulmonary interstitial opacity, more pronounced in the upper lungs. No pneumothorax, pleural effusion, confluent lung opacity. No acute osseous abnormality identified. Abdominal Calcified aortic atherosclerosis. Negative visible bowel gas. IMPRESSION: Chronic interstitial lung disease, chronic cardiomegaly. Aortic Atherosclerosis (ICD10-I70.0). No acute cardiopulmonary abnormality. Electronically Signed   By: Odessa Fleming M.D.   On: 01/05/2024 07:55   Assessment/Plan Kathleen Kane is a 63 y.o. female with medical history significant for permanent A-fib, CHF, IPF not on oxygen, RA on chronic prednisone, insulin-dependent type 2 diabetes, OSA being admitted to the hospital with acute hypoxic respiratory failure, dehydration and AKI in the  setting of influenza A infection.  Acute hypoxic respiratory failure-likely due to influenza A infection, no evidence of bacterial pneumonia, no chest pain or persistent tachycardia.  Patient is anticoagulated so PE is much less likely. -Inpatient admission -Patient has been symptomatic for nearly a month, so Tamiflu is not indicated -Continue supportive care, with Xopenex nebulizer as needed  AKI on  CKD stage III-likely this is prerenal azotemia from dehydration, in the setting of reduced oral intake, and continue nephrotoxic medications in that setting. -Hold nephrotoxins such as her Lasix, losartan, and Aldactone -Hydrate gently with LR -Monitor renal function with daily labs  Hyponatremia-this is asymptomatic, I suspect this is a hypovolemic hyponatremia in the setting of significant dehydration, perhaps with a component of SIADH. -Will empirically hydrate with normal saline, and add 1800 cc / 24-hour fluid restriction -In the meantime we will check serum and urine osmolality, as well as urine sodium -Monitor sodium level closely every 8 hours  Abnormal troponin-EKG without acute changes, and patient is without any cardiac symptoms.  Likely of limited significance in the setting of acute kidney injury, will trend her troponin.  Hypokalemia-repleted orally, recheck with morning labs.  In the meantime we will add on magnesium level.  Permanent atrial fibrillation-rate controlled, continue home metoprolol and Eliquis for stroke prophylaxis  Insulin-dependent type 2 diabetes-last hemoglobin A1c 7.4 in May 2023 -Update hemoglobin A1c -Carb modified diet -Continue home 70/30 insulin at reduced dose of 15 units twice daily; at home she takes 20 units in the morning and 30 units at night however will reduce this due to her reduced p.o. intake -Moderate dose sliding scale  Rheumatoid arthritis-continue her chronic prednisone 10 mg p.o. daily, will plan on stress dose in case of evidence of  adrenal insufficiency  Iron deficiency anemia-continue ferrous sulfate tablet once daily  Hyperlipidemia-continue Lipitor 20 mg p.o. daily    Code Status: Full Code  Consults called: None  Admission status: The appropriate patient status for this patient is INPATIENT. Inpatient status is judged to be reasonable and necessary in order to provide the required intensity of service to ensure the patient's safety. The patient's presenting symptoms, physical exam findings, and initial radiographic and laboratory data in the context of their chronic comorbidities is felt to place them at high risk for further clinical deterioration. Furthermore, it is not anticipated that the patient will be medically stable for discharge from the hospital within 2 midnights of admission.    I certify that at the point of admission it is my clinical judgment that the patient will require inpatient hospital care spanning beyond 2 midnights from the point of admission due to high intensity of service, high risk for further deterioration and high frequency of surveillance required  Time spent: 59 minutes  Faizon Capozzi Sharlette Dense MD [facility] Pager [phone]  If 7PM-7AM, please contact night-coverage [email or web address] Password [facility]  01/05/2024, 10:46 AM

## 2024-01-05 NOTE — ED Provider Notes (Signed)
 [place] EMERGENCY DEPARTMENT AT [facility] Provider Note   CSN: [id number] Arrival date & time: 01/05/24  9147     History  Chief Complaint  Patient presents with   Cough   Back Pain    Kathleen Kane is a 63 y.o. female.  62y/o female with hx of persistent AF on Eliquis, diastolic HF, OSA (does not use CPAP), HTN, DM, RA on chronic prednisone therapy, obesity and prior tobacco abuse who reports she has been sick for 1 month but gotten worse over the last 3 weeks.  She reports that initially started with vomiting and diarrhea which has improved but she has had ongoing cough which on occasion is productive, pain in her ribs related to the cough with occasional posttussive emesis.  She also reports she was not able to produce much urine and generally feels unwell.  She has noticed dyspnea on exertion and her husband reports she is sleeping all the time.  Patient reports she really cannot eat or drink much of anything but she has not noticed any swelling in her lower extremities.  She denies any fever or nasal congestion.  She reports she just cannot feel any better.  She was started on antibiotics yesterday by her PCP as she reports they checked her urine and said she had an infection but she cannot remember what antibiotic they started her on.  She has had 1 dose.  However she reports her family thought she looks so bad this morning they made her come to the emergency room.  The nurse reported as patient entered the emergency room when she was put on a pulse ox she had an oxygen saturation of 84% and was placed on 2 L.  Patient reports she has never worn oxygen at home.  She did report that earlier in the course of this illness she was having wheezing but that has seemed to stop.  She has been compliant with her medications.  The history is provided by the patient.  Cough Back Pain      Home Medications Prior to Admission medications   Medication Sig Start Date End Date  Taking? Authorizing Provider  Accu-Chek FastClix Lancets MISC Apply topically. 08/04/20   [provider]  ACCU-CHEK GUIDE test strip  08/14/20   [provider]  atorvastatin (LIPITOR) 20 MG tablet TAKE 1 TABLET (20 MG TOTAL) BY MOUTH DAILY. PLEASE SCHEDULE YEARLY APPOINTMENT FOR FUTURE REFILLS. 1ST ATTEMPT. THANK YOU 01/29/22   Wendall Stade, MD  B-D ULTRAFINE III SHORT PEN 31G X 8 MM MISC Inject into the skin 2 (two) times daily. 06/30/20   [provider]  blood glucose meter kit and supplies Dispense based on patient and insurance preference. Use up to four times daily as directed. (FOR ICD-10 E10.9, E11.9). 10/03/18   Glade Lloyd, MD  diazepam (VALIUM) 5 MG tablet Take 1 tablet (5 mg total) by mouth every 6 (six) hours as needed for muscle spasms. 04/16/23   Lorre Nick, MD  ELIQUIS 5 MG TABS tablet TAKE 1 TABLET BY MOUTH TWICE A DAY 06/13/23   Wendall Stade, MD  ferrous sulfate 325 (65 FE) MG tablet Take 1 tablet (325 mg total) by mouth daily with breakfast. 03/15/22   Marinda Elk, MD  folic acid (FOLVITE) 1 MG tablet Take 1 mg by mouth every morning. 08/19/17   [provider]  furosemide (LASIX) 40 MG tablet Take 1 tablet (40 mg total) by mouth daily. 07/06/22  Carlisle Beers, FNP  levalbuterol [facility] HFA) 45 MCG/ACT inhaler Inhale 1-2 puffs into the lungs every 6 (six) hours as needed for wheezing. 03/19/23   Valinda Hoar, NP  losartan (COZAAR) 25 MG tablet TAKE 1 TABLET BY MOUTH EVERY DAY 10/29/19   Wendall Stade, MD  metFORMIN (GLUCOPHAGE) 500 MG tablet Take 2 tablets (1,000 mg total) by mouth 2 (two) times daily. 10/03/18   Glade Lloyd, MD  metolazone (ZAROXOLYN) 5 MG tablet TAKE 1 TABLET EVERY OTHER DAY. TAKE 30 MINUTES PRIOR TO TAKING FUROSEMIDE ( LASIX ) 11/10/23   Wendall Stade, MD  metoprolol tartrate (LOPRESSOR) 25 MG tablet TAKE 1 TABLET BY MOUTH TWICE A DAY 06/08/23   Wendall Stade, MD  NOVOLOG MIX 70/30 FLEXPEN  (70-30) 100 UNIT/ML FlexPen Inject 0.4 mLs (40 Units total) into the skin 2 (two) times daily with a meal. 10/03/18   Glade Lloyd, MD  potassium chloride (KLOR-CON M) 10 MEQ tablet Take 1 tablet (10 mEq total) by mouth 2 (two) times daily. 08/09/23   Wendall Stade, MD  predniSONE (DELTASONE) 10 MG tablet TAKE 1 TABLET BY MOUTH EVERY DAY 08/09/23   Kalman Shan, MD  predniSONE (STERAPRED UNI-PAK 48 TAB) 10 MG (48) TBPK tablet Take as directed. 08/31/23   Mardella Layman, MD  spironolactone (ALDACTONE) 25 MG tablet Take 0.5 tablets (12.5 mg total) by mouth daily. 04/21/22   Swinyer, Zachary George, NP  sulfamethoxazole-trimethoprim (BACTRIM DS) 800-160 MG tablet TAKE 1 TABLET BY MOUTH 3 (THREE) TIMES A WEEK. (MON, WED, FRI) 10/31/23   Kalman Shan, MD  Tocilizumab (ACTEMRA ACTPEN) 162 MG/0.9ML SOAJ Inject 162 mg into the skin every 7 (seven) days. 12/07/23   Kalman Shan, MD      Allergies    Ace inhibitors and Lisinopril    Review of Systems   Review of Systems  Respiratory:  Positive for cough.   Musculoskeletal:  Positive for back pain.    Physical Exam Updated Vital Signs BP (!) 173/102 (BP Location: Right Arm)   Pulse 90   Temp 98.6 F (37 C) (Oral)   Resp 18   LMP 10/28/2013   SpO2 96%  Physical Exam Vitals and nursing note reviewed.  Constitutional:      General: She is not in acute distress.    Appearance: She is well-developed.  HENT:     Head: Normocephalic and atraumatic.     Mouth/Throat:     Mouth: Mucous membranes are dry.  Eyes:     Pupils: Pupils are equal, round, and reactive to light.  Cardiovascular:     Rate and Rhythm: Normal rate. Rhythm irregular.     Heart sounds: Murmur heard.     No friction rub.  Pulmonary:     Effort: Pulmonary effort is normal. Tachypnea present.     Breath sounds: Rales present. No wheezing.  Abdominal:     General: Bowel sounds are normal. There is no distension.     Palpations: Abdomen is soft.     Tenderness:  There is no abdominal tenderness. There is no guarding or rebound.  Musculoskeletal:        General: No tenderness. Normal range of motion.     Right lower leg: No edema.     Left lower leg: No edema.     Comments: No edema  Skin:    General: Skin is warm and dry.     Findings: No rash.  Neurological:     Mental Status: She  is alert and oriented to person, place, and time.     Cranial Nerves: No cranial nerve deficit.  Psychiatric:        Behavior: Behavior normal.     ED Results / Procedures / Treatments   Labs (all labs ordered are listed, but only abnormal results are displayed) Labs Reviewed  RESP PANEL BY RT-PCR (RSV, FLU A&B, COVID)  RVPGX2 - Abnormal; Notable for the following components:      Result Value   Influenza A by PCR POSITIVE (*)    All other components within normal limits  CBC - Abnormal; Notable for the following components:   Platelets 124 (*)    All other components within normal limits  COMPREHENSIVE METABOLIC PANEL - Abnormal; Notable for the following components:   Sodium 124 (*)    Potassium 2.9 (*)    Chloride 82 (*)    Glucose, Bld 212 (*)    BUN 32 (*)    Creatinine, Ser 2.06 (*)    Calcium 8.5 (*)    AST 55 (*)    Total Bilirubin 1.4 (*)    GFR, Estimated 26 (*)    Anion gap 16 (*)    All other components within normal limits  BLOOD GAS, VENOUS - Abnormal; Notable for the following components:   pH, Ven 7.44 (*)    Bicarbonate 31.2 (*)    Acid-Base Excess 6.0 (*)    All other components within normal limits  TROPONIN I (HIGH SENSITIVITY) - Abnormal; Notable for the following components:   Troponin I (High Sensitivity) 68 (*)    All other components within normal limits  BRAIN NATRIURETIC PEPTIDE  LIPASE, BLOOD  URINALYSIS, ROUTINE W REFLEX MICROSCOPIC  TROPONIN I (HIGH SENSITIVITY)    EKG EKG Interpretation Date/Time:  Thursday January 05 2024 07:26:07 EST Ventricular Rate:  104 PR Interval:    QRS Duration:  132 QT  Interval:  458 QTC Calculation: 588 R Axis:   -53  Text Interpretation: Atrial fibrillation RBBB and LAFB No significant change since last tracing Confirmed by Gwyneth Sprout ([id number]) on 01/05/2024 7:55:10 AM  Radiology DG Chest 2 View Result Date: 01/05/2024 CLINICAL DATA:  63 year old female with shortness of breath, cough, hypoxia. Interstitial lung disease. EXAM: CHEST - 2 VIEW COMPARISON:  CT chest 05/19/2023. Chest radiographs 01/02/2024 and earlier. FINDINGS: PA and lateral views 0736 hours. Stable mild cardiomegaly, mediastinal contours. Stable lung volumes since last year. Coarse bilateral pulmonary interstitial opacity, more pronounced in the upper lungs. No pneumothorax, pleural effusion, confluent lung opacity. No acute osseous abnormality identified. Abdominal Calcified aortic atherosclerosis. Negative visible bowel gas. IMPRESSION: Chronic interstitial lung disease, chronic cardiomegaly. Aortic Atherosclerosis (ICD10-I70.0). No acute cardiopulmonary abnormality. Electronically Signed   By: Odessa Fleming M.D.   On: 01/05/2024 07:55    Procedures Procedures    Medications Ordered in ED Medications  lactated ringers bolus 500 mL (has no administration in time range)  metoprolol tartrate (LOPRESSOR) tablet 25 mg (has no administration in time range)  potassium chloride SA (KLOR-CON M) CR tablet 40 mEq (has no administration in time range)    ED Course/ Medical Decision Making/ A&P                                 Medical Decision Making Amount and/or Complexity of Data Reviewed Independent Historian: spouse External Data Reviewed: notes. Labs: ordered. Decision-making details documented in ED Course. Radiology: ordered and independent interpretation  performed. Decision-making details documented in ED Course. ECG/medicine tests: ordered and independent interpretation performed. Decision-making details documented in ED Course.  Risk Prescription drug management.   Pt with  multiple medical problems and comorbidities and presenting today with a complaint that caries a high risk for morbidity and mortality.  Here today with multiple symptoms concerning for possible infection such as pneumonia versus CHF versus ACS versus hypercarbia versus viral etiology.  Patient was found to be hypoxic today.  She does have a tobacco use history but does not use inhalers regularly.  No wheezing on exam to suggest COPD.  Patient is on prednisone regularly and also concern for possible need for stress dose steroids given her ongoing illness.  Also concern for possible AKI.  Patient also reports she was placed on antibiotics yesterday for urinary infection however is not complaining of dysuria but just reports she is not making much urine.  Patient was hypoxic here to 84% and was placed on 2 L.  She reports improvement with 2 L of oxygen and sats have been 95 to 96% at rest. 10:22 AM I independently interpreted patient's labs and EKG.  EKG with atrial fibrillation without acute change with a right bundle branch block.  BNP today within normal limits, CBC without acute findings, CMP with new hyponatremia with a sodium of 124, potassium is low at 2.9 and new AKI with creatinine of 2.06 from her baseline of 1.3-1.5 with an anion gap of 16, lipase within normal limits, troponin is elevated at 68 which is little bit higher than her baseline which is in the 40s.  Blood gas without acute findings.  Patient is also flu positive.  I have independently visualized and interpreted pt's images today.  Chest x-ray with chronic changes but no acute findings.  Concerned that patient is dehydrated due to her poor oral intake and AKI is most likely from that as well as ongoing use of diuretics and blood pressure medications.  Patient's blood pressure is significantly elevated today at 173/102 but has not taken her blood pressure medications.  Will give a dose of her metoprolol and IV fluids.  Given patient's hypoxia and  above findings feel that she needs admission for treatment of her electrolyte abnormalities, AKI and hypoxia.  Discussed this with she and her husband and they are comfortable with this plan.  Consulted hospitalist for admission.         Final Clinical Impression(s) / ED Diagnoses Final diagnoses:  Influenza A  Hypokalemia  Hyponatremia  AKI (acute kidney injury) (HCC)  Acute respiratory failure with hypoxia [facility])    Rx / DC Orders ED Discharge Orders     None         Gwyneth Sprout, MD 01/05/24 1022

## 2024-01-05 NOTE — Plan of Care (Signed)

## 2024-01-05 NOTE — Progress Notes (Signed)
   01/05/24 1545  Spiritual Encounters  Type of Visit Initial  Care provided to: Pt and family  Referral source Nurse (RN/NT/LPN)  Reason for visit Routine spiritual support  OnCall Visit No   Chaplain visited patient as I rounded on the unit. The patient, Kathleen Kane and her husband said they were ok and not interested in visiting at this time. I wished them a peaceful evening as I departed.   Valerie Roys  [facility]  [phone]

## 2024-01-06 DIAGNOSIS — J9601 Acute respiratory failure with hypoxia: Secondary | ICD-10-CM

## 2024-01-06 DIAGNOSIS — E871 Hypo-osmolality and hyponatremia: Secondary | ICD-10-CM | POA: Diagnosis not present

## 2024-01-06 DIAGNOSIS — N179 Acute kidney failure, unspecified: Secondary | ICD-10-CM

## 2024-01-06 DIAGNOSIS — J101 Influenza due to other identified influenza virus with other respiratory manifestations: Secondary | ICD-10-CM | POA: Diagnosis not present

## 2024-01-06 LAB — BASIC METABOLIC PANEL
Anion gap: 12 (ref 5–15)
Anion gap: 13 (ref 5–15)
BUN: 35 mg/dL — ABNORMAL HIGH (ref 8–23)
BUN: 35 mg/dL — ABNORMAL HIGH (ref 8–23)
CO2: 26 mmol/L (ref 22–32)
CO2: 25 mmol/L (ref 22–32)
Calcium: 8.5 mg/dL — ABNORMAL LOW (ref 8.9–10.3)
Calcium: 8.9 mg/dL (ref 8.9–10.3)
Chloride: 87 mmol/L — ABNORMAL LOW (ref 98–111)
Chloride: 89 mmol/L — ABNORMAL LOW (ref 98–111)
Creatinine, Ser: 1.7 mg/dL — ABNORMAL HIGH (ref 0.44–1.00)
Creatinine, Ser: 1.54 mg/dL — ABNORMAL HIGH (ref 0.44–1.00)
GFR, Estimated: 33 mL/min — ABNORMAL LOW (ref >60)
GFR, Estimated: 37 mL/min — ABNORMAL LOW (ref >60)
Glucose, Bld: 196 mg/dL — ABNORMAL HIGH (ref 70–99)
Glucose, Bld: 270 mg/dL — ABNORMAL HIGH (ref 70–99)
Potassium: 2.9 mmol/L — ABNORMAL LOW (ref 3.5–5.1)
Potassium: 3.4 mmol/L — ABNORMAL LOW (ref 3.5–5.1)
Sodium: 125 mmol/L — ABNORMAL LOW (ref 135–145)
Sodium: 127 mmol/L — ABNORMAL LOW (ref 135–145)

## 2024-01-06 LAB — CBC
HCT: 39.3 % (ref 36.0–46.0)
HCT: 39.2 % (ref 36.0–46.0)
Hemoglobin: 13 g/dL (ref 12.0–15.0)
Hemoglobin: 13.4 g/dL (ref 12.0–15.0)
MCH: 30.4 pg (ref 26.0–34.0)
MCH: 30.4 pg (ref 26.0–34.0)
MCHC: 33.1 g/dL (ref 30.0–36.0)
MCHC: 34.2 g/dL (ref 30.0–36.0)
MCV: 92 fL (ref 80.0–100.0)
MCV: 88.9 fL (ref 80.0–100.0)
Platelets: 111 10*3/uL — ABNORMAL LOW (ref 150–400)
Platelets: 130 10*3/uL — ABNORMAL LOW (ref 150–400)
RBC: 4.27 MIL/uL (ref 3.87–5.11)
RBC: 4.41 MIL/uL (ref 3.87–5.11)
RDW: 13.3 % (ref 11.5–15.5)
RDW: 13.1 % (ref 11.5–15.5)
WBC: 6.4 10*3/uL (ref 4.0–10.5)
WBC: 3.9 10*3/uL — ABNORMAL LOW (ref 4.0–10.5)
nRBC: 0 % (ref 0.0–0.2)
nRBC: 0 % (ref 0.0–0.2)

## 2024-01-06 LAB — GLUCOSE, CAPILLARY
Glucose-Capillary: 209 mg/dL — ABNORMAL HIGH (ref 70–99)
Glucose-Capillary: 274 mg/dL — ABNORMAL HIGH (ref 70–99)

## 2024-01-06 LAB — HEMOGLOBIN A1C
Hgb A1c MFr Bld: 9 % — ABNORMAL HIGH (ref 4.8–5.6)
Mean Plasma Glucose: 211.6 mg/dL

## 2024-01-06 MED ORDER — LACTATED RINGERS IV SOLN: INTRAVENOUS | Status: DC

## 2024-01-06 MED ORDER — POTASSIUM CHLORIDE CRYS ER 20 MEQ PO TBCR: 40.0000 meq | EXTENDED_RELEASE_TABLET | Freq: Once | ORAL | Status: DC

## 2024-01-06 NOTE — TOC Transition Note (Signed)
 Transition of Care [facility]) - Discharge Note   Patient Details  Name: Kathleen Kane MRN: [id number] Date of Birth: 06/09/1961  Transition of Care [facility]) CM/SW Contact:  Diona Browner, LCSW Phone Number: 01/06/2024, 3:37 PM   Clinical Narrative:    Pt medically ready to d/c. PT/OT recommended no services needed. No further TOC needs.    Final next level of care: Home/Self Care Barriers to Discharge: Barriers Resolved   Patient Goals and CMS Choice Patient states their goals for this hospitalization and ongoing recovery are:: return home   Choice offered to / list presented to : NA      Discharge Placement                       Discharge Plan and Services Additional resources added to the After Visit Summary for                    DME Agency: NA                  Social Drivers of Health (SDOH) Interventions SDOH Screenings   Food Insecurity: No Food Insecurity (01/05/2024)  Housing: Low Risk  (01/05/2024)  Transportation Needs: No Transportation Needs (01/05/2024)  Utilities: Not At Risk (01/05/2024)  Social Connections: Unknown (03/22/2022)   [facility] from [facility]  Tobacco Use: Medium Risk (01/02/2024)     Readmission Risk Interventions    01/06/2024   11:12 AM  Readmission Risk Prevention Plan  Transportation Screening Complete  PCP or Specialist Appt within 3-5 Days Complete  HRI or Home Care Consult Complete  Social Work Consult for Recovery Care Planning/Counseling Complete  Palliative Care Screening Not Applicable  Medication Review Oceanographer) Complete

## 2024-01-06 NOTE — Hospital Course (Addendum)
 63 year old woman presented with lethargy and cough.  Admitted for mild hypoxia, acute influenza, mild AKI and hyponatremia.  Consultants None   Procedures/Events None

## 2024-01-06 NOTE — TOC Initial Note (Signed)
 Transition of Care [facility]) - Initial/Assessment Note    Patient Details  Name: Kathleen Kane MRN: [id number] Date of Birth: [birth year]  Transition of Care [facility]) CM/SW Contact:    Diona Browner, LCSW Phone Number: 01/06/2024, 11:13 AM  Clinical Narrative:                 Pt pending evaluation from PT/OT. TOC following for d/c needs.          Patient Goals and CMS Choice Patient states their goals for this hospitalization and ongoing recovery are:: return home          Expected Discharge Plan and Services       Living arrangements for the past 2 months: Single Family Home                                      Prior Living Arrangements/Services Living arrangements for the past 2 months: Single Family Home Lives with:: Adult Children, Spouse Patient language and need for interpreter reviewed:: Yes Do you feel safe going back to the place where you live?: Yes      Need for Family Participation in Patient Care: No (Comment) Care giver support system in place?: Yes (comment)   Criminal Activity/Legal Involvement Pertinent to Current Situation/Hospitalization: No - Comment as needed  Activities of Daily Living   ADL Screening (condition at time of admission) Independently performs ADLs?: No Does the patient have a NEW difficulty with bathing/dressing/toileting/self-feeding that is expected to last >3 days?: Yes (Initiates electronic notice to provider for possible OT consult) Does the patient have a NEW difficulty with getting in/out of bed, walking, or climbing stairs that is expected to last >3 days?: Yes (Initiates electronic notice to provider for possible PT consult) Does the patient have a NEW difficulty with communication that is expected to last >3 days?: Yes (Initiates electronic notice to provider for possible SLP consult) Is the patient deaf or have difficulty hearing?: No Does the patient have difficulty seeing, even when wearing glasses/contacts?:  Yes Does the patient have difficulty concentrating, remembering, or making decisions?: No  Permission Sought/Granted                  Emotional Assessment Appearance:: Appears stated age Attitude/Demeanor/Rapport: Engaged Affect (typically observed): Accepting Orientation: : Oriented to Place, Oriented to Self, Oriented to  Time, Oriented to Situation Alcohol / Substance Use: Not Applicable Psych Involvement: No (comment)  Admission diagnosis:  Hypokalemia [E87.6] Hyponatremia [E87.1] Influenza A [J10.1] Acute respiratory failure with hypoxia (HCC) [J96.01] AKI (acute kidney injury) (HCC) [N17.9] Patient Active Problem List   Diagnosis Date Noted   Hyponatremia 01/05/2024   ILD (interstitial lung disease) (HCC) 04/21/2022   Nocturnal hypoxemia 04/21/2022   Acute pulmonary edema (HCC) 03/12/2022   Acute respiratory failure with hypoxia (HCC) 03/11/2022   Colon cancer screening 01/26/2022   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   Dysphagia 01/26/2022   History of colonic polyps 01/26/2022   Aortic atherosclerosis (HCC) 05/13/2021   Acute otalgia, right 11/26/2020   Arthralgia of right temporomandibular joint 11/26/2020   Bilateral impacted cerumen 11/26/2020   HCAP (healthcare-associated pneumonia) 10/02/2018   Chronic congestive heart failure (HCC) 10/01/2018   OSA (obstructive sleep apnea) 02/10/2017   Carpal tunnel syndrome, right 12/15/2016   Extensor tenosynovitis of wrist, right 12/15/2016   Bilateral primary osteoarthritis of knee 10/08/2015   Rheumatoid arthritis, 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  Acute on chronic diastolic CHF (congestive heart failure) (HCC) 08/08/2015   Morbid obesity (HCC) 05/06/2015   Essential hypertension 04/10/2015   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   Primary osteoarthritis involving multiple joints 04/09/2015   High risk medications (not anticoagulants) long-term use 02/04/2015   Elevated uric acid in  blood 01/23/2014   High risk medication use 01/23/2014   Numbness and tingling in left hand 10/01/2013   Tobacco abuse 10/01/2013   Elevated CA-125 02/06/2013   Pelvic mass in female 01/29/2013   Diabetes mellitus (HCC) 08/10/2012   Atrial fibrillation (HCC)    Chronic diastolic CHF (congestive heart failure) (HCC)    PCP:  Fleet Contras, MD Pharmacy:   [facility] [phone] - [place], [place] - [address] AT [facility] [address] [place] [address] Phone: [phone] Fax: [phone]  [place] [facility] [address] [place] [address] Phone: [phone] Fax: [phone]     Social Drivers of Health (SDOH) Social History: SDOH Screenings   Food Insecurity: No Food Insecurity (01/05/2024)  Housing: Low Risk  (01/05/2024)  Transportation Needs: No Transportation Needs (01/05/2024)  Utilities: Not At Risk (01/05/2024)  Social Connections: Unknown (03/22/2022)   Received from [facility]  Tobacco Use: Medium Risk (01/02/2024)   SDOH Interventions:     Readmission Risk Interventions    01/06/2024   11:12 AM  Readmission Risk Prevention Plan  Transportation Screening Complete  PCP or Specialist Appt within 3-5 Days Complete  HRI or Home Care Consult Complete  Social Work Consult for Recovery Care Planning/Counseling Complete  Palliative Care Screening Not Applicable  Medication Review Oceanographer) Complete

## 2024-01-06 NOTE — Evaluation (Signed)
 Physical Therapy Evaluation Patient Details Name: Kathleen Kane MRN: [id number] DOB: [birth year] Today's Date: 01/06/2024  History of Present Illness  63 y.o. female admitted to the hospital with acute hypoxic respiratory failure, dehydration and AKI in the setting of influenza A infection.  Past medical history significant for permanent A-fib, CHF, IPF, insulin-dependent type 2 diabetes, OSA , RA  Clinical Impression  Patient evaluated by Physical Therapy with no further acute PT needs identified. All education has been completed and the patient has no further questions.  Pt initially declined mobilizing reporting she is limited in her activity at baseline due to RA pain.  Pt agreeable to ambulate in room to monitor SPO2.  SPO2 92% on room air at rest, 88% on room air with ambulation and 90% on room air end of session.  Pt declines need for any DME upon d/c.  Pt hoping to be able to d/c home today. No further follow-up Physical Therapy or equipment needs. PT is signing off. Thank you for this referral.          If plan is discharge home, recommend the following:     Can travel by private vehicle        Equipment Recommendations None recommended by PT  Recommendations for Other Services       Functional Status Assessment Patient has not had a recent decline in their functional status     Precautions / Restrictions Precautions Precautions: None      Mobility  Bed Mobility Overal bed mobility: Modified Independent                  Transfers Overall transfer level: Modified independent                      Ambulation/Gait Ambulation/Gait assistance: Modified independent (Device/Increase time) Gait Distance (Feet): 72 Feet Assistive device: None Gait Pattern/deviations: Step-through pattern, Decreased stride length, Antalgic, Decreased stance time - right Gait velocity: decr     General Gait Details: antalgic gait from RA pain per pt, limited distance to  pt tolerance; SpO2 at lowest 88% on room air  Stairs            Wheelchair Mobility     Tilt Bed    Modified Rankin (Stroke Patients Only)       Balance Overall balance assessment: Mild deficits observed, not formally tested                                           Pertinent Vitals/Pain Pain Assessment Pain Assessment: Faces Faces Pain Scale: Hurts little more Pain Location: all over ("pain from my rhematoid") Pain Intervention(s): Repositioned, Monitored during session    Home Living Family/patient expects to be discharged to:: Private residence Living Arrangements: Spouse/significant other;Children Available Help at Discharge: Family Type of Home: House Home Access: Stairs to enter Entrance Stairs-Rails: None Secretary/administrator of Steps: 3   Home Layout: One level Home Equipment: Rollator (4 wheels)      Prior Function Prior Level of Function : Needs assist             Mobility Comments: uses rollator or shopping buggy when out of house       Extremity/Trunk Assessment        Lower Extremity Assessment Lower Extremity Assessment: Generalized weakness       Communication   Communication Communication:  No apparent difficulties    Cognition Arousal: Alert Behavior During Therapy: Flat affect   PT - Cognitive impairments: No apparent impairments                       PT - Cognition Comments: not very forthcoming with information, prefers not to move due to her RA pain, and reports limited active lifestyle at baseline Following commands: Intact       Cueing       General Comments      Exercises     Assessment/Plan    PT Assessment Patient does not need any further PT services  PT Problem List         PT Treatment Interventions      PT Goals (Current goals can be found in the Care Plan section)  Acute Rehab PT Goals PT Goal Formulation: All assessment and education complete, DC therapy     Frequency       Co-evaluation               AM-PAC PT "6 Clicks" Mobility  Outcome Measure Help needed turning from your back to your side while in a flat bed without using bedrails?: None Help needed moving from lying on your back to sitting on the side of a flat bed without using bedrails?: None Help needed moving to and from a bed to a chair (including a wheelchair)?: None Help needed standing up from a chair using your arms (e.g., wheelchair or bedside chair)?: A Little Help needed to walk in hospital room?: A Little Help needed climbing 3-5 steps with a railing? : A Little 6 Click Score: 21    End of Session   Activity Tolerance: Patient tolerated treatment well Patient left: in bed;with call bell/phone within reach   PT Visit Diagnosis: Difficulty in walking, not elsewhere classified (R26.2)    Time: 1100-1109 PT Time Calculation (min) (ACUTE ONLY): 9 min   Charges:   PT Evaluation $PT Eval Low Complexity: 1 Low   PT General Charges $$ ACUTE PT VISIT: 1 Visit       Paulino Door, DPT Physical Therapist Acute Rehabilitation Services Office: [phone]   [name] 01/06/2024, 12:04 PM

## 2024-01-06 NOTE — Evaluation (Signed)
 Occupational Therapy Evaluation Patient Details Name: Kathleen Kane MRN: [id number] DOB: Jan 21, 1961 Today's Date: 01/06/2024   History of Present Illness   63 y.o. female admitted to the hospital with acute hypoxic respiratory failure, dehydration and AKI in the setting of influenza A infection.  Past medical history significant for permanent A-fib, CHF, IPF, insulin-dependent type 2 diabetes, OSA , RA     Clinical Impressions PTA pt lives with husband, daughter and grandchildren and is modified independent. Pt at bseline on RA with VSS. Educated pt on pursed lip breathing and energy conservation strategies. PT states the MD told her to eat more salt however she is concerned how this will affect her heart failure. Encouraged her to have this discussion with her MD. OT signing off.      If plan is discharge home, recommend the following:         Functional Status Assessment         Equipment Recommendations   None recommended by OT     Recommendations for Other Services         Precautions/Restrictions   Precautions Precautions: None Restrictions Weight Bearing Restrictions Per Provider Order: No     Mobility Bed Mobility Overal bed mobility: Modified Independent                  Transfers Overall transfer level: Modified independent                        Balance Overall balance assessment: Mild deficits observed, not formally tested                                         ADL either performed or assessed with clinical judgement   ADL Overall ADL's : At baseline                                       General ADL Comments: Educated on use of pursed lip breathing and energy conservation for ADL tasks     Vision         Perception         Praxis         Pertinent Vitals/Pain Pain Assessment Pain Assessment: Faces Faces Pain Scale: Hurts little more Pain Location: all over ("pain from  my rhematoid") Pain Descriptors / Indicators: Discomfort Pain Intervention(s): Limited activity within patient's tolerance     Extremity/Trunk Assessment Upper Extremity Assessment Upper Extremity Assessment: Overall WFL for tasks assessed   Lower Extremity Assessment Lower Extremity Assessment: Defer to PT evaluation   Cervical / Trunk Assessment Cervical / Trunk Assessment: Normal   Communication Communication Communication: No apparent difficulties   Cognition Arousal: Alert Behavior During Therapy: Flat affect Cognition: No apparent impairments                               Following commands: Intact       Cueing  General Comments          Exercises     Shoulder Instructions      Home Living Family/patient expects to be discharged to:: Private residence Living Arrangements: Spouse/significant other;Children Available Help at Discharge: Family Type of Home: House Home Access: Stairs to enter Secretary/administrator  of Steps: 3 Entrance Stairs-Rails: None Home Layout: One level     Bathroom Shower/Tub: Chief Strategy Officer: Standard Bathroom Accessibility: Yes   Home Equipment: Rollator (4 wheels);Rolling Walker (2 wheels);Cane - single point;Shower seat          Prior Functioning/Environment Prior Level of Function : Needs assist             Mobility Comments: uses rollator or shopping buggy when out of house      OT Problem List:     OT Treatment/Interventions:        OT Goals(Current goals can be found in the care plan section)   Acute Rehab OT Goals Patient Stated Goal: to breath better OT Goal Formulation: All assessment and education complete, DC therapy   OT Frequency:       Co-evaluation              AM-PAC OT "6 Clicks" Daily Activity     Outcome Measure Help from another person eating meals?: None Help from another person taking care of personal grooming?: None Help from another  person toileting, which includes using toliet, bedpan, or urinal?: None Help from another person bathing (including washing, rinsing, drying)?: None Help from another person to put on and taking off regular upper body clothing?: None Help from another person to put on and taking off regular lower body clothing?: None 6 Click Score: 24   End of Session Nurse Communication: Mobility status  Activity Tolerance: Patient tolerated treatment well Patient left: in bed                   Time: 1419-1435 OT Time Calculation (min): 16 min Charges:  OT General Charges $OT Visit: 1 Visit OT Evaluation $OT Eval Low Complexity: 1 Low  Luisa Dago, OT/L   Acute OT Clinical Specialist Acute Rehabilitation Services Pager [phone] Office [phone]   [facility] 01/06/2024, 2:45 PM

## 2024-01-06 NOTE — Discharge Summary (Signed)
 Physician Discharge Summary   Patient: Kathleen Kane MRN: [id number] DOB: [birth year]  Admit date:     01/05/2024  Discharge date: 01/06/24  Discharge Physician: Brendia Sacks   PCP: Fleet Contras, MD   Recommendations at discharge:  Follow-up AKI, see below.  Losartan on hold.  Lasix, Zaroxolyn and Aldactone on hold until Monday.  Follow-up hyponatremia  Discharge Diagnoses: Principal Problem:   Hyponatremia  Resolved Problems:   * No resolved hospital problems. *  Hospital Course: 63 year old woman presented with lethargy and cough.  Admitted for mild hypoxia, acute influenza, mild AKI and hyponatremia.  Consultants None   Procedures/Events None   Assessment and Plan: Acute hypoxia without respiratory failure Influenza Secondary to influenza.  Now resolved. No signs or symptoms to suggest PE and the patient is already anticoagulated. Past the window for Tamiflu Appears clinically recovered  AKI superimposed on CKD stage IIIb Resolving nicely.  Expect spontaneous resolution. Hold Lasix and Aldactone until Monday.  Hold losartan until sees PCP.  Marked hyponatremia Likely hypovolemic hyponatremia with hypochloremia Sodium improved with saline Inspect spontaneous improvement.  Corrected sodium is 130.  She is asymptomatic. Follow-up as an outpatient.  Mildly elevated troponin A bit above her elevated baseline but given AKI this is not unexpected EKG shows A-fib with right bundle branch block which is old.  Taken together nonacute.  No further evaluation suggested.  Permanent atrial fibrillation Continue metoprolol and apixaban  Insulin-dependent diabetes mellitus type 2 Stable.  Continue outpatient regimen.  Disposition: Home Diet recommendation:  Discharge Diet Orders (From admission, onward)     Start     Ordered   01/06/24 0000  Diet - low sodium heart healthy        01/06/24 1512           Carb modified diet DISCHARGE  MEDICATION: Allergies as of 01/06/2024       Reactions   Ace Inhibitors Cough   Lisinopril Cough        Medication List     PAUSE taking these medications    furosemide 40 MG tablet Wait to take this until: January 09, 2024 Commonly known as: LASIX Take 1 tablet (40 mg total) by mouth daily.   losartan 25 MG tablet Wait to take this until your doctor or other care provider tells you to start again. Commonly known as: COZAAR TAKE 1 TABLET BY MOUTH EVERY DAY   metolazone 5 MG tablet Wait to take this until: January 09, 2024 Commonly known as: ZAROXOLYN TAKE 1 TABLET EVERY OTHER DAY. TAKE 30 MINUTES PRIOR TO TAKING FUROSEMIDE ( LASIX )       STOP taking these medications    spironolactone 25 MG tablet Commonly known as: ALDACTONE   sulfamethoxazole-trimethoprim 800-160 MG tablet Commonly known as: BACTRIM DS       TAKE these medications    Accu-Chek FastClix Lancets Misc Apply topically.   Accu-Chek Guide test strip Generic drug: glucose blood   Actemra ACTPen 162 MG/0.9ML Soaj Generic drug: Tocilizumab Inject 162 mg into the skin every 7 (seven) days.   amoxicillin-clavulanate 875-125 MG tablet Commonly known as: AUGMENTIN Take 1 tablet by mouth 2 (two) times daily.   atorvastatin 20 MG tablet Commonly known as: LIPITOR TAKE 1 TABLET (20 MG TOTAL) BY MOUTH DAILY. PLEASE SCHEDULE YEARLY APPOINTMENT FOR FUTURE REFILLS. 1ST ATTEMPT. THANK YOU   B-D ULTRAFINE III SHORT PEN 31G X 8 MM Misc Generic drug: Insulin Pen Needle Inject into the skin 2 (two) times daily.  blood glucose meter kit and supplies Dispense based on patient and insurance preference. Use up to four times daily as directed. (FOR ICD-10 E10.9, E11.9).   cetirizine 10 MG tablet Commonly known as: ZYRTEC Take 10 mg by mouth at bedtime as needed for allergies.   diazepam 5 MG tablet Commonly known as: VALIUM Take 1 tablet (5 mg total) by mouth every 6 (six) hours as needed for muscle  spasms.   Eliquis 5 MG Tabs tablet Generic drug: apixaban TAKE 1 TABLET BY MOUTH TWICE A DAY What changed: how much to take   ferrous sulfate 325 (65 FE) MG tablet Take 1 tablet (325 mg total) by mouth daily with breakfast.   folic acid 1 MG tablet Commonly known as: FOLVITE Take 1 mg by mouth every morning.   levalbuterol 45 MCG/ACT inhaler Commonly known as: Xopenex HFA Inhale 1-2 puffs into the lungs every 6 (six) hours as needed for wheezing.   metFORMIN 500 MG tablet Commonly known as: GLUCOPHAGE Take 2 tablets (1,000 mg total) by mouth 2 (two) times daily.   metoprolol tartrate 25 MG tablet Commonly known as: LOPRESSOR TAKE 1 TABLET BY MOUTH TWICE A DAY   NovoLOG Mix 70/30 FlexPen (70-30) 100 UNIT/ML FlexPen Generic drug: insulin aspart protamine - aspart Inject 0.4 mLs (40 Units total) into the skin 2 (two) times daily with a meal.   potassium chloride 10 MEQ tablet Commonly known as: KLOR-CON M Take 1 tablet (10 mEq total) by mouth 2 (two) times daily.   predniSONE 10 MG tablet Commonly known as: DELTASONE TAKE 1 TABLET BY MOUTH EVERY DAY        Follow-up Information     Fleet Contras, MD. Schedule an appointment as soon as possible for a visit in 1 week(s).   Specialty: Internal Medicine Contact information: [address] [place] [address] [phone]                Feels better, very well, wants to go home  Discharge Exam: There were no vitals filed for this visit. Physical Exam Vitals reviewed.  Constitutional:      General: She is not in acute distress.    Appearance: She is not ill-appearing or toxic-appearing.  Cardiovascular:     Rate and Rhythm: Normal rate and regular rhythm.     Heart sounds: No murmur heard. Pulmonary:     Effort: Pulmonary effort is normal. No respiratory distress.     Breath sounds: No wheezing, rhonchi or rales.  Neurological:     Mental Status: She is alert.  Psychiatric:        Behavior:  Behavior normal.      Condition at discharge: good  The results of significant diagnostics from this hospitalization (including imaging, microbiology, ancillary and laboratory) are listed below for reference.   Imaging Studies: DG Chest 2 View Result Date: 01/05/2024 CLINICAL DATA:  63 year old female with shortness of breath, cough, hypoxia. Interstitial lung disease. EXAM: CHEST - 2 VIEW COMPARISON:  CT chest 05/19/2023. Chest radiographs 01/02/2024 and earlier. FINDINGS: PA and lateral views 0736 hours. Stable mild cardiomegaly, mediastinal contours. Stable lung volumes since last year. Coarse bilateral pulmonary interstitial opacity, more pronounced in the upper lungs. No pneumothorax, pleural effusion, confluent lung opacity. No acute osseous abnormality identified. Abdominal Calcified aortic atherosclerosis. Negative visible bowel gas. IMPRESSION: Chronic interstitial lung disease, chronic cardiomegaly. Aortic Atherosclerosis (ICD10-I70.0). No acute cardiopulmonary abnormality. Electronically Signed   By: Odessa Fleming M.D.   On: 01/05/2024 07:55  DG Chest 1 View Result Date: 01/02/2024 CLINICAL DATA:  Cough for a month EXAM: CHEST  1 VIEW COMPARISON:  X-ray 10/20/2023 FINDINGS: No consolidation, pneumothorax or effusion. Normal cardiopericardial silhouette. Diffuse interstitial changes are again seen, increased from previous. Acute process is possible. Recommend short follow-up or additional evaluation when appropriate. Degenerative changes along the spine IMPRESSION: Developing diffuse interstitial changes the lungs. Acute process is possible. Recommend follow-up Electronically Signed   By: Karen Kays M.D.   On: 01/02/2024 11:50    Microbiology: Results for orders placed or performed during the hospital encounter of 01/05/24  Resp panel by RT-PCR (RSV, Flu A&B, Covid) Anterior Nasal Swab     Status: Abnormal   Collection Time: 01/05/24  7:29 AM   Specimen: Anterior Nasal Swab  Result Value  Ref Range Status   SARS Coronavirus 2 by RT PCR NEGATIVE NEGATIVE Final    Comment: (NOTE) SARS-CoV-2 target nucleic acids are NOT DETECTED.  The SARS-CoV-2 RNA is generally detectable in upper respiratory specimens during the acute phase of infection. The lowest concentration of SARS-CoV-2 viral copies this assay can detect is 138 copies/mL. A negative result does not preclude SARS-Cov-2 infection and should not be used as the sole basis for treatment or other patient management decisions. A negative result may occur with  improper specimen collection/handling, submission of specimen other than nasopharyngeal swab, presence of viral mutation(s) within the areas targeted by this assay, and inadequate number of viral copies(<138 copies/mL). A negative result must be combined with clinical observations, patient history, and epidemiological information. The expected result is Negative.  Fact Sheet for Patients:  BloggerCourse.com  Fact Sheet for Healthcare Providers:  SeriousBroker.it  This test is no t yet approved or cleared by the [place] FDA and  has been authorized for detection and/or diagnosis of SARS-CoV-2 by FDA under an Emergency Use Authorization (EUA). This EUA will remain  in effect (meaning this test can be used) for the duration of the COVID-19 declaration under Section 564(b)(1) of the Act, 21 U.S.C.section 360bbb-3(b)(1), unless the authorization is terminated  or revoked sooner.       Influenza A by PCR POSITIVE (A) NEGATIVE Final   Influenza B by PCR NEGATIVE NEGATIVE Final    Comment: (NOTE) The Xpert Xpress SARS-CoV-2/FLU/RSV plus assay is intended as an aid in the diagnosis of influenza from Nasopharyngeal swab specimens and should not be used as a sole basis for treatment. Nasal washings and aspirates are unacceptable for Xpert Xpress SARS-CoV-2/FLU/RSV testing.  Fact Sheet for  Patients: BloggerCourse.com  Fact Sheet for Healthcare Providers: SeriousBroker.it  This test is not yet approved or cleared by the [place] FDA and has been authorized for detection and/or diagnosis of SARS-CoV-2 by FDA under an Emergency Use Authorization (EUA). This EUA will remain in effect (meaning this test can be used) for the duration of the COVID-19 declaration under Section 564(b)(1) of the Act, 21 U.S.C. section 360bbb-3(b)(1), unless the authorization is terminated or revoked.     Resp Syncytial Virus by PCR NEGATIVE NEGATIVE Final    Comment: (NOTE) Fact Sheet for Patients: BloggerCourse.com  Fact Sheet for Healthcare Providers: SeriousBroker.it  This test is not yet approved or cleared by the [place] FDA and has been authorized for detection and/or diagnosis of SARS-CoV-2 by FDA under an Emergency Use Authorization (EUA). This EUA will remain in effect (meaning this test can be used) for the duration of the COVID-19 declaration under Section 564(b)(1) of the Act, 21 U.S.C. section  360bbb-3(b)(1), unless the authorization is terminated or revoked.  Performed at [facility], [address], [place], [place] [address]     Labs: CBC: Recent Labs  Lab 01/02/24 [phone] 01/05/24 0845 01/06/24 0124 01/06/24 1441  WBC 3.4* 6.1 6.4 3.9*  NEUTROABS 2.4  --   --   --   HGB 15.7* 14.8 13.0 13.4  HCT 46.0 43.3 39.3 39.2  MCV 89.8 89.1 92.0 88.9  PLT 208 124* 111* 130*   Basic Metabolic Panel: Recent Labs  Lab 01/02/24 0939 01/05/24 0845 01/05/24 1251 01/05/24 1736 01/06/24 0124 01/06/24 1441  NA 137 124*  --  126* 125* 127*  K 2.9* 2.9*  --  3.5 2.9* 3.4*  CL 92* 82*  --  85* 87* 89*  CO2 27 26  --  27 26 25   GLUCOSE 181* 212*  --  288* 196* 270*  BUN 13 32*  --  32* 35* 35*  CREATININE 1.44* 2.06*  --  1.93* 1.70* 1.54*   CALCIUM 10.5* 8.5*  --  8.7* 8.5* 8.9  MG  --   --  1.7  --   --   --    Liver Function Tests: Recent Labs  Lab 01/02/24 0939 01/05/24 0845  AST 30 55*  ALT 37 39  ALKPHOS 50 46  BILITOT 1.3* 1.4*  PROT 7.6 6.8  ALBUMIN 4.4 3.9   CBG: Recent Labs  Lab 01/05/24 1306 01/05/24 1646 01/05/24 2109 01/06/24 0800 01/06/24 1312  GLUCAP 199* 291* 190* 209* 274*    Discharge time spent: greater than 30 minutes.  Signed: Brendia Sacks, MD [facility] 01/06/2024

## 2024-01-06 NOTE — Plan of Care (Signed)
   Problem: Education: Goal: Ability to describe self-care measures that may prevent or decrease complications (Diabetes Survival Skills Education) will improve Outcome: Progressing Goal: Individualized Educational Video(s) Outcome: Progressing

## 2024-01-06 NOTE — Discharge Instructions (Addendum)
 HOLD off taking Losartan until you follow up with your PCP. Please make an appointment with your PCP to follow up within 1 week for labs.  Resume the lasix and metolazone Monday 3/3.

## 2024-01-06 NOTE — Progress Notes (Addendum)
 Discharge instructions reviewed with patient and spouse. Patient able to verbalize teach back of instructions.  All questions answered. All belongings accounted for. Patient to follow up with MD in  1 week for labs and medication review.  PIV removed. Assisted via WC to private vehicle.

## 2024-01-11 ENCOUNTER — Other Ambulatory Visit (HOSPITAL_COMMUNITY): Payer: Self-pay

## 2024-01-26 ENCOUNTER — Other Ambulatory Visit (HOSPITAL_COMMUNITY): Payer: Self-pay

## 2024-01-30 ENCOUNTER — Other Ambulatory Visit: Payer: Self-pay

## 2024-01-30 ENCOUNTER — Other Ambulatory Visit (HOSPITAL_COMMUNITY): Payer: Self-pay

## 2024-01-30 NOTE — Progress Notes (Signed)
 Specialty Pharmacy Refill Coordination Note  Kathleen Kane is a 63 y.o. female contacted today regarding refills of specialty medication(s) Tocilizumab (Actemra ACTPen)   Patient requested [name] at [facility] [place] date: 02/08/24   Medication will be filled on 02/07/24.

## 2024-01-30 NOTE — Progress Notes (Signed)
 Specialty Pharmacy Ongoing Clinical Assessment Note  I spoke with the patient's daughter, Hospital doctor. Kathleen Kane is a 63 y.o. female who is being followed by the specialty pharmacy service for RxSp Rheumatoid Arthritis   Patient's specialty medication(s) reviewed today: Tocilizumab (Actemra ACTPen)   Missed doses in the last 4 weeks: 1 (patient was sick and hospitalized)   Patient/Caregiver did not have any additional questions or concerns.   Therapeutic benefit summary: Patient is achieving benefit   Adverse events/side effects summary: No adverse events/side effects   Patient's therapy is appropriate to: Continue    Goals Addressed             This Visit's Progress    Slow Disease Progression       Patient is on track. Patient will maintain adherence.  Patient's daughter reports that she is walking better and moving around more.          Follow up:  6 months  Servando Snare Specialty Pharmacist

## 2024-02-01 ENCOUNTER — Ambulatory Visit (INDEPENDENT_AMBULATORY_CARE_PROVIDER_SITE_OTHER): Payer: Medicaid Other | Admitting: Podiatry

## 2024-02-01 ENCOUNTER — Encounter: Payer: Self-pay | Admitting: Podiatry

## 2024-02-01 DIAGNOSIS — B351 Tinea unguium: Secondary | ICD-10-CM

## 2024-02-01 DIAGNOSIS — M79675 Pain in left toe(s): Secondary | ICD-10-CM | POA: Diagnosis not present

## 2024-02-01 DIAGNOSIS — E1142 Type 2 diabetes mellitus with diabetic polyneuropathy: Secondary | ICD-10-CM

## 2024-02-01 DIAGNOSIS — M79674 Pain in right toe(s): Secondary | ICD-10-CM | POA: Diagnosis not present

## 2024-02-01 NOTE — Progress Notes (Signed)
 This patient returns to my office for at risk foot care.  This patient requires this care by a professional since this patient will be at risk due to having diabetes.  This patient is unable to cut nails herself since the patient cannot reach her nails.These nails are painful walking and wearing shoes. She says all her nails were remobed and only the third toenail right foot has regrown. This patient presents for at risk foot care today.  General Appearance  Alert, conversant and in no acute stress.  Vascular  Dorsalis pedis and posterior tibial  pulses are palpable  bilaterally.  Capillary return is within normal limits  bilaterally. Temperature is within normal limits  bilaterally.  Neurologic  Senn-Weinstein monofilament wire test within normal limits  bilaterally. Muscle power within normal limits bilaterally.  Nails Thick disfigured discolored nails with subungual debris  third toenail right foot. No evidence of bacterial infection or drainage bilaterally.  Orthopedic  No limitations of motion  feet .  No crepitus or effusions noted.  No bony pathology or digital deformities noted.  Skin  normotropic skin with no porokeratosis noted bilaterally.  No signs of infections or ulcers noted.     Onychomycosis  Pain in right toes  Pain in left toes  Consent was obtained for treatment procedures.   Mechanical debridement of nails 1-5  bilaterally performed with a nail nipper.  Filed with dremel without incident.    Return office visit    3 months                 Told patient to return for periodic foot care and evaluation due to potential at risk complications.   Helane Gunther DPM

## 2024-02-07 ENCOUNTER — Ambulatory Visit: Payer: Medicaid Other | Admitting: Internal Medicine

## 2024-02-07 ENCOUNTER — Other Ambulatory Visit: Payer: Self-pay

## 2024-02-14 ENCOUNTER — Other Ambulatory Visit: Payer: Self-pay | Admitting: Cardiovascular Disease

## 2024-02-16 ENCOUNTER — Other Ambulatory Visit (HOSPITAL_COMMUNITY): Payer: Self-pay

## 2024-02-16 ENCOUNTER — Other Ambulatory Visit: Payer: Self-pay

## 2024-02-20 ENCOUNTER — Other Ambulatory Visit: Payer: Self-pay

## 2024-03-07 ENCOUNTER — Emergency Department (HOSPITAL_COMMUNITY)
Admission: EM | Admit: 2024-03-07 | Discharge: 2024-03-08 | Disposition: A | Discharge status: Home / Self Care | Attending: Emergency Medicine | Admitting: Emergency Medicine

## 2024-03-07 ENCOUNTER — Encounter (HOSPITAL_COMMUNITY): Payer: Self-pay

## 2024-03-07 ENCOUNTER — Other Ambulatory Visit: Payer: Self-pay

## 2024-03-07 DIAGNOSIS — I110 Hypertensive heart disease with heart failure: Secondary | ICD-10-CM | POA: Insufficient documentation

## 2024-03-07 DIAGNOSIS — N644 Mastodynia: Secondary | ICD-10-CM | POA: Diagnosis present

## 2024-03-07 DIAGNOSIS — Z7901 Long term (current) use of anticoagulants: Secondary | ICD-10-CM | POA: Diagnosis not present

## 2024-03-07 DIAGNOSIS — R739 Hyperglycemia, unspecified: Secondary | ICD-10-CM

## 2024-03-07 DIAGNOSIS — Z7984 Long term (current) use of oral hypoglycemic drugs: Secondary | ICD-10-CM | POA: Diagnosis not present

## 2024-03-07 DIAGNOSIS — B379 Candidiasis, unspecified: Secondary | ICD-10-CM | POA: Diagnosis not present

## 2024-03-07 DIAGNOSIS — E119 Type 2 diabetes mellitus without complications: Secondary | ICD-10-CM | POA: Insufficient documentation

## 2024-03-07 DIAGNOSIS — I5033 Acute on chronic diastolic (congestive) heart failure: Secondary | ICD-10-CM | POA: Diagnosis not present

## 2024-03-07 DIAGNOSIS — Z79899 Other long term (current) drug therapy: Secondary | ICD-10-CM | POA: Diagnosis not present

## 2024-03-07 DIAGNOSIS — B372 Candidiasis of skin and nail: Secondary | ICD-10-CM | POA: Insufficient documentation

## 2024-03-07 DIAGNOSIS — L0889 Other specified local infections of the skin and subcutaneous tissue: Secondary | ICD-10-CM | POA: Diagnosis not present

## 2024-03-07 LAB — CBC WITH DIFFERENTIAL/PLATELET
Abs Immature Granulocytes: 0.04 10*3/uL (ref 0.00–0.07)
Basophils Absolute: 0 10*3/uL (ref 0.0–0.1)
Basophils Relative: 1 %
Eosinophils Absolute: 0.1 10*3/uL (ref 0.0–0.5)
Eosinophils Relative: 1 %
HCT: 40.2 % (ref 36.0–46.0)
Hemoglobin: 13.7 g/dL (ref 12.0–15.0)
Immature Granulocytes: 1 %
Lymphocytes Relative: 18 %
Lymphs Abs: 1.4 10*3/uL (ref 0.7–4.0)
MCH: 30.4 pg (ref 26.0–34.0)
MCHC: 34.1 g/dL (ref 30.0–36.0)
MCV: 89.1 fL (ref 80.0–100.0)
Monocytes Absolute: 0.6 10*3/uL (ref 0.1–1.0)
Monocytes Relative: 7 %
Neutro Abs: 5.7 10*3/uL (ref 1.7–7.7)
Neutrophils Relative %: 72 %
Platelets: 199 10*3/uL (ref 150–400)
RBC: 4.51 MIL/uL (ref 3.87–5.11)
RDW: 12.9 % (ref 11.5–15.5)
WBC: 7.9 10*3/uL (ref 4.0–10.5)
nRBC: 0 % (ref 0.0–0.2)

## 2024-03-07 LAB — COMPREHENSIVE METABOLIC PANEL WITH GFR
ALT: 37 U/L (ref 0–44)
AST: 25 U/L (ref 15–41)
Albumin: 4 g/dL (ref 3.5–5.0)
Alkaline Phosphatase: 48 U/L (ref 38–126)
Anion gap: 12 (ref 5–15)
BUN: 27 mg/dL — ABNORMAL HIGH (ref 8–23)
CO2: 25 mmol/L (ref 22–32)
Calcium: 10 mg/dL (ref 8.9–10.3)
Chloride: 102 mmol/L (ref 98–111)
Creatinine, Ser: 1.14 mg/dL — ABNORMAL HIGH (ref 0.44–1.00)
GFR, Estimated: 53 mL/min — ABNORMAL LOW (ref >60)
Glucose, Bld: 250 mg/dL — ABNORMAL HIGH (ref 70–99)
Potassium: 3.4 mmol/L — ABNORMAL LOW (ref 3.5–5.1)
Sodium: 139 mmol/L (ref 135–145)
Total Bilirubin: 0.9 mg/dL (ref 0.0–1.2)
Total Protein: 7.1 g/dL (ref 6.5–8.1)

## 2024-03-07 MED ORDER — DOXYCYCLINE HYCLATE 100 MG PO TABS
100.0000 mg | ORAL_TABLET | Freq: Once | ORAL | Status: AC
  Administered 2024-03-08: 100 mg via ORAL
  Filled 2024-03-07: qty 1

## 2024-03-07 MED ORDER — NYSTATIN 100000 UNIT/GM EX POWD
Freq: Once | CUTANEOUS | Status: AC
  Filled 2024-03-07: qty 15

## 2024-03-07 NOTE — ED Triage Notes (Signed)
 Pt came in for left breast pain and blood/puss drainage under her breast for 2 weeks.

## 2024-03-07 NOTE — ED Provider Notes (Signed)
 [place] EMERGENCY DEPARTMENT AT [facility] Provider Note   CSN: [id number] Arrival date & time: 03/07/24  1742     History  Chief Complaint  Patient presents with   Breast Discharge   Breast Pain    Kathleen Kane is a 63 y.o. female.  HPI     This is a 63 year old female who presents with breast pain and discoloration.  Patient reports at least 1 week history of worsening pain, rash, bleeding underneath her left breast.  She finally called her primary care doctor today who will see her on Friday.  They reportedly called in antibiotics for her but she has not started taking any.  She reports bleeding underneath her left breast.  She has had cyst there previously.  Not recently had a mammogram.  No discharge from her nipple.  Denies fevers.  Home Medications Prior to Admission medications   Medication Sig Start Date End Date Taking? Authorizing Provider  doxycycline  (VIBRAMYCIN ) 100 MG capsule Take 1 capsule (100 mg total) by mouth 2 (two) times daily. 03/08/24  Yes Baltasar Twilley, Vonzella Guernsey, MD  nystatin  (MYCOSTATIN /NYSTOP ) powder Apply 1 Application topically 3 (three) times daily. 03/08/24  Yes Kristian Mogg, Vonzella Guernsey, MD  Accu-Chek FastClix Lancets MISC Apply topically. 08/04/20   [provider]  ACCU-CHEK GUIDE test strip  08/14/20   [provider]  amoxicillin -clavulanate (AUGMENTIN ) 875-125 MG tablet Take 1 tablet by mouth 2 (two) times daily. 01/04/24   [provider]  atorvastatin  (LIPITOR) 20 MG tablet TAKE 1 TABLET (20 MG TOTAL) BY MOUTH DAILY. PLEASE SCHEDULE YEARLY APPOINTMENT FOR FUTURE REFILLS. 1ST ATTEMPT. THANK YOU 01/29/22   Loyde Rule, MD  B-D ULTRAFINE III SHORT PEN 31G X 8 MM MISC Inject into the skin 2 (two) times daily. 06/30/20   [provider]  blood glucose meter kit and supplies Dispense based on patient and insurance preference. Use up to four times daily as directed. (FOR ICD-10 E10.9, E11.9). 10/03/18   Audria Leather, MD  cetirizine (ZYRTEC) 10 MG tablet Take 10 mg by mouth at bedtime as needed for allergies. 01/01/24   [provider]  diazepam  (VALIUM ) 5 MG tablet Take 1 tablet (5 mg total) by mouth every 6 (six) hours as needed for muscle spasms. 04/16/23   Lind Repine, MD  ELIQUIS  5 MG TABS tablet TAKE 1 TABLET BY MOUTH TWICE A DAY Patient taking differently: Take 5 mg by mouth 2 (two) times daily. 06/13/23   Loyde Rule, MD  ferrous sulfate  325 (65 FE) MG tablet Take 1 tablet (325 mg total) by mouth daily with breakfast. 03/15/22   Macdonald Savoy, MD  folic acid  (FOLVITE ) 1 MG tablet Take 1 mg by mouth every morning. 08/19/17   [provider]  furosemide  (LASIX ) 40 MG tablet Take 1 tablet (40 mg total) by mouth daily. 07/06/22   Starlene Eaton, FNP  levalbuterol  (XOPENEX  HFA) 45 MCG/ACT inhaler Inhale 1-2 puffs into the lungs every 6 (six) hours as needed for wheezing. 03/19/23   Reena Canning, NP  losartan  (COZAAR ) 25 MG tablet TAKE 1 TABLET BY MOUTH EVERY DAY 10/29/19   Nishan, Peter C, MD  metFORMIN  (GLUCOPHAGE ) 500 MG tablet Take 2 tablets (1,000 mg total) by mouth 2 (two) times daily. 10/03/18   Audria Leather, MD  metolazone  (ZAROXOLYN ) 5 MG tablet TAKE 1 TABLET EVERY OTHER DAY. TAKE 30 MINUTES PRIOR TO TAKING FUROSEMIDE  ( LASIX  ) 02/14/24   Loyde Rule, MD  metoprolol  tartrate (LOPRESSOR ) 25 MG tablet TAKE 1 TABLET BY MOUTH TWICE A DAY 06/08/23   Loyde Rule, MD  NOVOLOG  MIX 70/30 FLEXPEN (70-30) 100 UNIT/ML FlexPen Inject 0.4 mLs (40 Units total) into the skin 2 (two) times daily with a meal. 10/03/18   Audria Leather, MD  potassium chloride  (KLOR-CON  M) 10 MEQ tablet Take 1 tablet (10 mEq total) by mouth 2 (two) times daily. 08/09/23   Loyde Rule, MD  predniSONE  (DELTASONE ) 10 MG tablet TAKE 1 TABLET BY MOUTH EVERY DAY 08/09/23   Maire Scot, MD  Tocilizumab  (ACTEMRA  ACTPEN) 162 MG/0.9ML SOAJ Inject 162 mg into the skin every 7 (seven) days.  12/07/23   Maire Scot, MD      Allergies    Ace inhibitors and Lisinopril     Review of Systems   Review of Systems  Constitutional:  Negative for fever.  Cardiovascular:        Breast pain  All other systems reviewed and are negative.   Physical Exam Updated Vital Signs BP (!) 160/108   Pulse 77   Temp 98 F (36.7 C)   Resp 17   LMP 10/28/2013   SpO2 96%  Physical Exam Vitals and nursing note reviewed.  Constitutional:      Appearance: She is well-developed. She is obese. She is not ill-appearing.  HENT:     Head: Normocephalic and atraumatic.  Eyes:     Pupils: Pupils are equal, round, and reactive to light.  Cardiovascular:     Rate and Rhythm: Normal rate and regular rhythm.     Heart sounds: Normal heart sounds.  Pulmonary:     Effort: Pulmonary effort is normal. No respiratory distress.     Breath sounds: No wheezing.  Chest:       Comments: Large area underneath the left breast with skin breakdown and satellite lesions, there is some oozing and bleeding, no nipple discharge, no fluctuance or abscess noted Abdominal:     Palpations: Abdomen is soft.     Tenderness: There is no abdominal tenderness.  Musculoskeletal:     Cervical back: Neck supple.  Skin:    General: Skin is warm and dry.  Neurological:     Mental Status: She is alert and oriented to person, place, and time.  Psychiatric:        Mood and Affect: Mood normal.     ED Results / Procedures / Treatments   Labs (all labs ordered are listed, but only abnormal results are displayed) Labs Reviewed  COMPREHENSIVE METABOLIC PANEL WITH GFR - Abnormal; Notable for the following components:      Result Value   Potassium 3.4 (*)    Glucose, Bld 250 (*)    BUN 27 (*)    Creatinine, Ser 1.14 (*)    GFR, Estimated 53 (*)    All other components within normal limits  CBC WITH DIFFERENTIAL/PLATELET    EKG None  Radiology No results found.  Procedures Procedures    Medications  Ordered in ED Medications  nystatin  (MYCOSTATIN /NYSTOP ) topical powder ( Topical Given 03/08/24 0011)  doxycycline  (VIBRA -TABS) tablet 100 mg (100 mg Oral Given 03/08/24 0011)    ED Course/ Medical Decision Making/ A&P                                 Medical Decision Making Risk Prescription drug management.   This patient presents to the ED for concern  of breast pain, this involves an extensive number of treatment options, and is a complaint that carries with it a high risk of complications and morbidity.  I considered the following differential and admission for this acute, potentially life threatening condition.  The differential diagnosis includes yeast infection, secondary bacterial infection, abscess, cyst, mass  MDM:    This is a 63 year old female who presents with pain of the left breast.  She is nontoxic-appearing and vital signs are reassuring.  She is afebrile.  She has a large area of skin breakdown underneath her left breast most consistent with a yeast infection.  Likely has a secondary bacterial infection at this time as well given weeping.  Will start on nystatin  powder and doxycycline .  No masses or fluctuance noted.  Doubt abscess.  Recommend that she follow-up with her primary doctor as scheduled.  Labs reviewed.  No leukocytosis.  Glucose 250.  May be contributing.  (Labs, imaging, consults)  Labs: I Ordered, and personally interpreted labs.  The pertinent results include: CBC, BMP  Imaging Studies ordered: I ordered imaging studies including none I independently visualized and interpreted imaging. I agree with the radiologist interpretation  Additional history obtained from chart review.  External records from outside source obtained and reviewed including prior evaluations  Cardiac Monitoring: The patient was not maintained on a cardiac monitor.  If on the cardiac monitor, I personally viewed and interpreted the cardiac monitored which showed an underlying rhythm  of: N/A  Reevaluation: After the interventions noted above, I reevaluated the patient and found that they have :stayed the same  Social Determinants of Health:  lives independently  Disposition: Discharge  Co morbidities that complicate the patient evaluation  Past Medical History:  Diagnosis Date   Acute on chronic diastolic CHF (congestive heart failure) (HCC) 08/08/2015   Anxiety    Atrial fibrillation, persistent (HCC)    Carpal tunnel syndrome, right 12/15/2016   CHF (congestive heart failure) (HCC) 2013   No echo found from that time, most likely diastolic   CHF exacerbation (HCC) 10/01/2018   Depression    Diabetes mellitus (HCC) 08/10/2012   Diabetes mellitus without complication (HCC)    Hypertension    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 AHI-6.8/hr, during REM-27.3/hr. RDI-25.0/hr, during REM-40.9/hr. 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    Dispense:  20 capsule    Refill:  0   nystatin  (MYCOSTATIN /NYSTOP ) powder    Sig: Apply 1 Application topically 3 (three) times daily.    Dispense:  15 g    Refill:  0    I have reviewed the patients home medicines and have made adjustments as needed  Problem List / ED Course: Problem List Items Addressed This Visit   None Visit Diagnoses       Yeast infection    -  Primary   Relevant  Medications   nystatin  (MYCOSTATIN /NYSTOP ) topical powder (Completed)   nystatin  (MYCOSTATIN /NYSTOP ) powder     Candidal skin infection       Relevant Medications   nystatin  (MYCOSTATIN /NYSTOP ) topical powder (Completed)   nystatin  (MYCOSTATIN /NYSTOP ) powder     Secondary infection of skin       Relevant Medications   nystatin  (MYCOSTATIN /NYSTOP ) topical powder (Completed)   nystatin  (MYCOSTATIN /NYSTOP ) powder                   Final Clinical Impression(s) / ED Diagnoses Final diagnoses:  Yeast infection  Candidal skin infection  Secondary infection of skin    Rx / DC Orders ED Discharge Orders          Ordered    doxycycline  (VIBRAMYCIN ) 100 MG capsule  2 times daily        03/08/24 0109    nystatin  (MYCOSTATIN /NYSTOP ) powder  3 times daily        03/08/24 0109              Chancy Claros, Vonzella Guernsey, MD 03/08/24 [phone]

## 2024-03-08 MED ORDER — DOXYCYCLINE HYCLATE 100 MG PO CAPS: 100.0000 mg | ORAL_CAPSULE | Freq: Two times a day (BID) | ORAL | 0 refills | Status: DC

## 2024-03-08 MED ORDER — NYSTATIN 100000 UNIT/GM EX POWD
1.0000 | Freq: Three times a day (TID) | CUTANEOUS | 0 refills | Status: DC
Start: 2024-03-08 — End: 2024-07-04

## 2024-03-08 NOTE — Discharge Instructions (Addendum)
 You were seen today and found to have a yeast infection under your breast.  You may also have a secondary bacterial infection.  Take antibiotics as prescribed.  Make sure that you are keeping the area clean and dry.  Use nystatin  powder for the yeast infection.  Avoid wearing a bra or tight fitting underwear or clothing.  Follow-up with your primary doctor on Friday as scheduled.  Make sure that you are monitoring your blood sugars closely.  High blood sugars can contribute to yeast infections.

## 2024-03-09 ENCOUNTER — Other Ambulatory Visit: Payer: Self-pay

## 2024-03-12 ENCOUNTER — Other Ambulatory Visit: Payer: Self-pay

## 2024-03-12 ENCOUNTER — Other Ambulatory Visit: Payer: Self-pay | Admitting: Internal Medicine

## 2024-03-12 ENCOUNTER — Other Ambulatory Visit (HOSPITAL_COMMUNITY): Payer: Self-pay

## 2024-03-12 DIAGNOSIS — M059 Rheumatoid arthritis with rheumatoid factor, unspecified: Secondary | ICD-10-CM

## 2024-03-12 DIAGNOSIS — J8489 Other specified interstitial pulmonary diseases: Secondary | ICD-10-CM

## 2024-03-12 DIAGNOSIS — Z5181 Encounter for therapeutic drug level monitoring: Secondary | ICD-10-CM

## 2024-03-12 DIAGNOSIS — M359 Systemic involvement of connective tissue, unspecified: Secondary | ICD-10-CM

## 2024-03-12 DIAGNOSIS — Z79899 Other long term (current) drug therapy: Secondary | ICD-10-CM

## 2024-03-12 MED ORDER — ACTEMRA ACTPEN 162 MG/0.9ML ~~LOC~~ SOAJ
162.0000 mg | SUBCUTANEOUS | 2 refills | Status: DC
  Filled 2024-03-12: qty 3.6, 28d supply, fill #0
  Filled 2024-04-11: qty 3.6, 28d supply, fill #1

## 2024-03-12 NOTE — Progress Notes (Signed)
 Specialty Pharmacy Refill Coordination Note  Kathleen Kane is a 63 y.o. female contacted today regarding refills of specialty medication(s) Tocilizumab  (Actemra  ACTPen)   Patient requested [name] at [facility] [place] date: 03/13/24   Medication will be filled on 03/13/24.   This fill date is pending response to refill request from provider. Patient is aware and if they have not received fill by intended date they must follow up with pharmacy.  Patient will call doctor as well to make sure they send in refill as she does not have an injection for tomorrow.

## 2024-03-12 NOTE — Telephone Encounter (Signed)
 Refill sent for ACTEMRA  SQ to [facility]: [phone]   Dose: 162mg  subcut every 7 days  Last OV: 05/23/2023 Provider: Dr. Brooke Canton Pertinent labs: TB gold neg 11/14/23, CBC on 03/07/2024 wnl  Next OV: 05/28/24  Geraldene Kleine, PharmD, MPH, BCPS Clinical Pharmacist (Rheumatology and Pulmonology)

## 2024-03-13 ENCOUNTER — Other Ambulatory Visit: Payer: Self-pay

## 2024-03-27 ENCOUNTER — Other Ambulatory Visit: Payer: Self-pay

## 2024-04-03 ENCOUNTER — Other Ambulatory Visit: Payer: Self-pay

## 2024-04-05 ENCOUNTER — Other Ambulatory Visit: Payer: Self-pay

## 2024-04-05 ENCOUNTER — Ambulatory Visit: Payer: Self-pay | Admitting: Internal Medicine

## 2024-04-05 NOTE — Progress Notes (Signed)
 Normal blood tb test

## 2024-04-06 ENCOUNTER — Other Ambulatory Visit: Payer: Self-pay

## 2024-04-11 ENCOUNTER — Other Ambulatory Visit (HOSPITAL_COMMUNITY): Payer: Self-pay

## 2024-04-11 ENCOUNTER — Other Ambulatory Visit: Payer: Self-pay

## 2024-04-11 ENCOUNTER — Telehealth: Payer: Self-pay

## 2024-04-11 NOTE — Telephone Encounter (Signed)
 Received notification from [facility] pharmacy that patient requires a new authorization for their medication.  Submitted an URGENT Prior Authorization request to [employer] for ACTEMRA  SQ via CoverMyMeds. Will update once we receive a response.  Key: [facility]

## 2024-04-11 NOTE — Progress Notes (Signed)
 Specialty Pharmacy Refill Coordination Note  Kathleen Kane is a 63 y.o. female contacted today regarding refills of specialty medication(s) Tocilizumab  (Actemra  ACTPen)   Patient requested [name] at [facility] [place] date: 04/13/24   Medication will be filled on 06.06.25.   PA required on Actemra , patient aware. Will route to [facility] for assistance

## 2024-04-12 ENCOUNTER — Telehealth: Payer: Self-pay

## 2024-04-12 DIAGNOSIS — J8489 Other specified interstitial pulmonary diseases: Secondary | ICD-10-CM

## 2024-04-12 DIAGNOSIS — M059 Rheumatoid arthritis with rheumatoid factor, unspecified: Secondary | ICD-10-CM

## 2024-04-12 DIAGNOSIS — M359 Systemic involvement of connective tissue, unspecified: Secondary | ICD-10-CM

## 2024-04-12 NOTE — Telephone Encounter (Signed)
 Submitted an URGENT Prior Authorization request to [facility] for TYENNE SQ via CoverMyMeds. Will update once we receive a response.  Key: [id number]

## 2024-04-12 NOTE — Telephone Encounter (Signed)
 Received a fax regarding Prior Authorization from [facility] for ACTEMRA  SQ. Authorization has been DENIED because pt must have previously tried/failed Tyenne OR specific medical reasoning needs to be provided as to why pt cannot take the preferred medication. Denial letter has been sent to scan center for retention.  Will begin BIV for Tyenne in separate encounter under the correct context.

## 2024-04-13 ENCOUNTER — Other Ambulatory Visit: Payer: Self-pay

## 2024-04-13 ENCOUNTER — Other Ambulatory Visit (HOSPITAL_COMMUNITY): Payer: Self-pay

## 2024-04-13 MED ORDER — TYENNE 162 MG/0.9ML ~~LOC~~ SOAJ: 162.0000 mg | SUBCUTANEOUS | 0 refills | Status: DC

## 2024-04-13 NOTE — Telephone Encounter (Signed)
 Received notification from [facility] regarding a prior authorization for TYENNE SQ. Authorization has been APPROVED from 04/12/2024 to 11/07/2024. Approval letter sent to scan center.  Per test claim, copay for 28 days supply is $0  Patient can continue to fill through [facility]: [phone]   Authorization # [id number]  Unfortunately, [facility] does not currently have any Tyenne in stock as medication is still on backorder until late June and the pulmonology clinic does not keep any samples on hand. Will investigate options in order to prevent a lapse in therapy.

## 2024-04-13 NOTE — Telephone Encounter (Signed)
 Called [facility] to determine if their pharmacy is able to order Tyenne as [facility] does not have access at this time. Per rep, they do have access to medication.  Rx for Tyenne escribed to [facility] (Phone: [phone])  Geraldene Kleine, PharmD, MPH, BCPS, CPP Clinical Pharmacist (Rheumatology and Pulmonology)

## 2024-04-25 ENCOUNTER — Ambulatory Visit (INDEPENDENT_AMBULATORY_CARE_PROVIDER_SITE_OTHER)

## 2024-04-25 ENCOUNTER — Ambulatory Visit (HOSPITAL_COMMUNITY)
Admission: EM | Admit: 2024-04-25 | Discharge: 2024-04-25 | Disposition: A | Discharge status: Home / Self Care | Attending: Physician Assistant | Admitting: Physician Assistant

## 2024-04-25 ENCOUNTER — Encounter (HOSPITAL_COMMUNITY): Payer: Self-pay | Admitting: Emergency Medicine

## 2024-04-25 DIAGNOSIS — J069 Acute upper respiratory infection, unspecified: Secondary | ICD-10-CM

## 2024-04-25 DIAGNOSIS — R051 Acute cough: Secondary | ICD-10-CM

## 2024-04-25 LAB — POC COVID19/FLU A&B COMBO
Covid Antigen, POC: NEGATIVE
Influenza A Antigen, POC: NEGATIVE
Influenza B Antigen, POC: NEGATIVE

## 2024-04-25 MED ORDER — CETIRIZINE HCL 10 MG PO TABS: 10.0000 mg | ORAL_TABLET | Freq: Every day | ORAL | 0 refills | Status: AC

## 2024-04-25 MED ORDER — BENZONATATE 100 MG PO CAPS
100.0000 mg | ORAL_CAPSULE | Freq: Three times a day (TID) | ORAL | 0 refills | Status: AC
Start: 2024-04-25 — End: ?

## 2024-04-25 NOTE — ED Provider Notes (Signed)
 [facility]    CSN: [id number] Arrival date & time: 04/25/24  1802      History   Chief Complaint Chief Complaint  Patient presents with   Cough    HPI Kathleen Kane is a 63 y.o. female.   Patient presents today with a 3-day history of URI symptoms including cough, sneezing, congestion, scratchy throat.  She denies any fever, chest pain, nausea, vomiting, shortness of breath.  She denies any known sick contacts but was around several children and at [facility] cheese where she could have been exposed to something.  She has never had COVID has not had COVID vaccines.  She does report being treated with Augmentin  about a month ago but has not had any more recent antibiotics.  She is on chronic prednisone  of 10 mg daily but denies any additional steroid use in the past 90 days.  She does have a history of interstitial lung disease and is currently being treated with Tyenne  and followed by pulmonology.  She does not currently use any inhalers.    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 AHI-6.8/hr, during REM-27.3/hr. RDI-25.0/hr, during REM-40.9/hr. 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   Dysphagia 01/26/2022   History of colonic polyps 01/26/2022   Aortic atherosclerosis (HCC) 05/13/2021   Acute otalgia, right 11/26/2020   Arthralgia of right temporomandibular joint 11/26/2020   Bilateral impacted cerumen 11/26/2020   HCAP (healthcare-associated pneumonia) 10/02/2018   Chronic congestive heart failure (HCC) 10/01/2018   OSA (obstructive sleep apnea) 02/10/2017   Carpal tunnel syndrome, right 12/15/2016   Extensor tenosynovitis of wrist, right 12/15/2016   Bilateral primary osteoarthritis of knee 10/08/2015   Rheumatoid arthritis, 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   Acute on chronic diastolic CHF (congestive heart failure) (HCC) 08/08/2015   Morbid obesity (HCC) 05/06/2015   Essential hypertension 04/10/2015   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   Primary osteoarthritis involving multiple joints 04/09/2015   High risk medications (not anticoagulants) long-term use 02/04/2015   Elevated uric acid in blood 01/23/2014   High risk medication use 01/23/2014   Numbness and tingling in left hand 10/01/2013   Tobacco abuse 10/01/2013   Elevated CA-125 02/06/2013   Pelvic mass in female 01/29/2013   Diabetes  mellitus (HCC) 08/10/2012   Atrial fibrillation (HCC)    Chronic diastolic CHF (congestive heart failure) [facility])     Past Surgical History:  Procedure Laterality Date   CARDIOVASCULAR STRESS TEST  02/16/2013   no significant EKG changes with Lexiscan , normal LV function and normal wall function   CESAREAN SECTION     x2   DOPPLER ECHOCARDIOGRAPHY   01/24/2008   EF 55-60%, no diagnostic evidence of LV wall motion abnormalities, LV wall thickness was mild-moderately increased.   HAND SURGERY Right 2018   LAPAROTOMY Right 02/27/2013   Procedure: EXPLORATORY LAPAROTOMY, right salpingo-oopherectomy;  Surgeon: Terry Ficks, MD;  Location: [facility];  Service: Gynecology;  Laterality: Right;   left breast cyst     SALPINGOOPHORECTOMY Right 02/27/2013   Procedure: SALPINGO OOPHORECTOMY;  Surgeon: Terry Ficks, MD;  Location: [facility];  Service: Gynecology;  Laterality: Right;   TUBAL LIGATION Bilateral 1989    OB History   No obstetric history on file.      Home Medications    Prior to Admission medications   Medication Sig Start Date End Date Taking? Authorizing Provider  benzonatate  (TESSALON ) 100 MG capsule Take 1 capsule (100 mg total) by mouth every 8 (eight) hours. 04/25/24  Yes Mayerly Kaman, Betsey Brow, PA-C  Accu-Chek FastClix Lancets MISC Apply topically. 08/04/20   [provider]  ACCU-CHEK GUIDE test strip  08/14/20   [provider]  atorvastatin  (LIPITOR) 20 MG tablet TAKE 1 TABLET (20 MG TOTAL) BY MOUTH DAILY. PLEASE SCHEDULE YEARLY APPOINTMENT FOR FUTURE REFILLS. 1ST ATTEMPT. THANK YOU 01/29/22   Loyde Rule, MD  B-D ULTRAFINE III SHORT PEN 31G X 8 MM MISC Inject into the skin 2 (two) times daily. 06/30/20   [provider]  blood glucose meter kit and supplies Dispense based on patient and insurance preference. Use up to four times daily as directed. (FOR ICD-10 E10.9, E11.9). 10/03/18   Audria Leather, MD  cetirizine (ZYRTEC) 10 MG tablet Take 1 tablet (10 mg total) by mouth at bedtime. 04/25/24   Wong Steadham K, PA-C  diazepam  (VALIUM ) 5 MG tablet Take 1 tablet (5 mg total) by mouth every 6 (six) hours as needed for muscle spasms. 04/16/23   Lind Repine, MD  ELIQUIS  5 MG TABS tablet TAKE 1 TABLET BY MOUTH TWICE A DAY Patient taking differently: Take 5 mg by mouth 2 (two) times daily. 06/13/23   Loyde Rule, MD  ferrous sulfate  325 (65 FE) MG tablet Take 1 tablet (325 mg total) by mouth daily with breakfast. 03/15/22   Macdonald Savoy, MD  folic acid  (FOLVITE ) 1 MG tablet Take 1 mg by mouth every morning. 08/19/17   [provider]  furosemide  (LASIX ) 40 MG tablet Take 1 tablet (40 mg total) by mouth daily. 07/06/22   Starlene Eaton, FNP  levalbuterol  (XOPENEX  HFA) 45 MCG/ACT inhaler Inhale 1-2 puffs into the lungs every 6 (six) hours as needed for wheezing. 03/19/23   Reena Canning, NP  losartan  (COZAAR ) 25 MG tablet TAKE 1 TABLET BY MOUTH EVERY DAY 10/29/19   Loyde Rule, MD  metFORMIN  (GLUCOPHAGE ) 500 MG tablet Take 2 tablets (1,000 mg total) by mouth 2 (two) times daily. 10/03/18   Audria Leather, MD  metolazone  (ZAROXOLYN ) 5 MG tablet TAKE 1 TABLET EVERY OTHER DAY. TAKE 30 MINUTES PRIOR TO TAKING FUROSEMIDE  ( LASIX  ) 02/14/24   Loyde Rule, MD  metoprolol  tartrate (LOPRESSOR ) 25 MG tablet TAKE 1 TABLET BY MOUTH  TWICE A DAY 06/08/23   Loyde Rule, MD  NOVOLOG  MIX 70/30 FLEXPEN (70-30) 100 UNIT/ML FlexPen Inject 0.4 mLs (40 Units total) into the skin 2 (two) times daily with a meal. 10/03/18   Audria Leather, MD  nystatin  (MYCOSTATIN /NYSTOP ) powder Apply 1 Application topically 3 (three) times daily. 03/08/24   Horton, Vonzella Guernsey, MD  potassium chloride  (KLOR-CON  M) 10 MEQ tablet Take 1 tablet (10 mEq total) by mouth 2 (two) times daily. 08/09/23   Loyde Rule, MD  predniSONE  (DELTASONE ) 10 MG tablet TAKE 1 TABLET BY MOUTH EVERY DAY 08/09/23   Maire Scot, MD  Tocilizumab -aazg (TYENNE ) 162 MG/0.9ML SOAJ Inject 162 mg into the skin every 7 (seven) days. 04/13/24   Maire Scot, MD    Family History Family History  Problem Relation Age of Onset   Hypertension Mother    Diabetes Mother    Throat cancer Mother    Hypertension Father    Diabetes Sister    Heart Problems Sister    Kidney failure Sister    Hypertension Brother    Diabetes Brother     Diabetes Paternal Grandmother    Autoimmune disease Daughter    Healthy Son     Social History Social History   Tobacco Use   Smoking status: Former    Current packs/day: 0.00    Average packs/day: 1 pack/day for 37.6 years (37.6 ttl pk-yrs)    Types: Cigarettes    Start date: 25    Quit date: 06/08/2014    Years since quitting: 9.8    Passive exposure: Never   Smokeless tobacco: Never  Vaping Use   Vaping status: Former  Substance Use Topics   Alcohol use: No    Alcohol/week: 0.0 standard drinks of alcohol   Drug use: No     Allergies   Ace inhibitors and Lisinopril    Review of Systems Review of Systems  Constitutional:  Positive for activity change. Negative for appetite change, fatigue and fever.  HENT:  Positive for congestion, postnasal drip and sore throat. Negative for sinus pressure and sneezing.   Respiratory:  Positive for cough. Negative for shortness of breath.   Cardiovascular:  Negative for chest pain.  Gastrointestinal:  Negative for abdominal pain, diarrhea, nausea and vomiting.  Neurological:  Negative for dizziness, light-headedness and headaches.     Physical Exam Triage Vital Signs ED Triage Vitals  Encounter Vitals Group     BP 04/25/24 1850 (!) 156/81     Girls Systolic BP Percentile --      Girls Diastolic BP Percentile --      Boys Systolic BP Percentile --      Boys Diastolic BP Percentile --      Pulse Rate 04/25/24 1850 80     Resp 04/25/24 1850 19     Temp 04/25/24 1850 98.7 F (37.1 C)     Temp Source 04/25/24 1850 Oral     SpO2 04/25/24 1850 93 %     Weight --      Height --      Head Circumference --      Peak Flow --      Pain Score 04/25/24 1848 0     Pain Loc --      Pain Education --      Exclude from Growth Chart --    No data found.  Updated Vital Signs BP (!) 156/81 (BP Location: Right Arm)   Pulse 80   Temp 98.7 F (37.1  C) (Oral)   Resp 19   LMP 10/28/2013   SpO2 93%   Visual Acuity Right Eye  Distance:   Left Eye Distance:   Bilateral Distance:    Right Eye Near:   Left Eye Near:    Bilateral Near:     Physical Exam Vitals reviewed.  Constitutional:      General: She is awake. She is not in acute distress.    Appearance: Normal appearance. She is well-developed. She is not ill-appearing.     Comments: Very pleasant female appears stated age in no acute distress sitting comfortably in exam room  HENT:     Head: Normocephalic and atraumatic.     Right Ear: Tympanic membrane, ear canal and external ear normal. Tympanic membrane is not erythematous or bulging.     Left Ear: Tympanic membrane, ear canal and external ear normal. Tympanic membrane is not erythematous or bulging.     Nose:     Right Sinus: No maxillary sinus tenderness or frontal sinus tenderness.     Left Sinus: No maxillary sinus tenderness or frontal sinus tenderness.     Mouth/Throat:     Pharynx: Uvula midline. Postnasal drip present. No oropharyngeal exudate or posterior oropharyngeal erythema.   Cardiovascular:     Rate and Rhythm: Normal rate. Rhythm irregularly irregular.     Heart sounds: Normal heart sounds, S1 normal and S2 normal. No murmur heard. Pulmonary:     Effort: Pulmonary effort is normal.     Breath sounds: Examination of the right-lower field reveals rales. Rales present. No wheezing or rhonchi.   Psychiatric:        Behavior: Behavior is cooperative.      UC Treatments / Results  Labs (all labs ordered are listed, but only abnormal results are displayed) Labs Reviewed  POC COVID19/FLU A&B COMBO    EKG   Radiology DG Chest 2 View Result Date: 04/25/2024 CLINICAL DATA:  Acute productive cough, sneezing, and nasal congestion. Swelling on right side of neck. EXAM: CHEST - 2 VIEW COMPARISON:  01/05/2024. FINDINGS: The heart size and mediastinal contours are stable. There is atherosclerotic calcification of the aorta. Chronic interstitial prominence is present bilaterally. No  consolidation, effusion, or pneumothorax is seen. No acute osseous abnormality. IMPRESSION: 1. No active cardiopulmonary disease. 2. Chronic interstitial lung disease. Electronically Signed   By: Wyvonnia Heimlich M.D.   On: 04/25/2024 19:24    Procedures Procedures (including critical care time)  Medications Ordered in UC Medications - No data to display  Initial Impression / Assessment and Plan / UC Course  I have reviewed the triage vital signs and the nursing notes.  Pertinent labs & imaging results that were available during my care of the patient were reviewed by me and considered in my medical decision making (see chart for details).     Patient is well-appearing, afebrile, nontoxic, nontachycardic.  No evidence of acute infection on physical exam that warrant initiation of antibiotics.  Suspect viral etiology of symptoms given short duration.  She does negative for COVID and flu.  Chest x-ray was obtained that showed chronic interstitial lung disease without acute cardiopulmonary disease.  We discussed that I believe her symptoms are related to either a virus or seasonal allergies and so we will treat accordingly.  She was given Tessalon  to help with cough and recommended over-the-counter medications for additional symptom relief including Mucinex , Flonase, nasal saline/sinus rinses, gargle with warm salt water.  She was also given Zyrtec to help  with congestion.  Recommend close follow-up with her primary care.  She is not feeling better within a week she is to return for reevaluation.  If she has any worsening or changing symptoms including high fever, worsening cough, shortness of breath, nausea/vomiting interfering with oral intake she needs to be seen emergently.  Strict return precautions given.  Excuse note provided.  Final Clinical Impressions(s) / UC Diagnoses   Final diagnoses:  Acute cough  Viral URI with cough     Discharge Instructions      Your x-ray not show any  evidence of pneumonia.  You were negative for COVID and flu.  I suspect you have either allergies or a different virus.  Start cetirizine nightly.  Take Tessalon  for cough.  I also recommend over-the-counter medicine including Mucinex , Tylenol , nasal saline/sinus rinses, gargle with warm salt water.  Use humidifier in your room.  If you are not feeling better in a week please return for reevaluation.  If anything worsens and you have high fever, worsening cough, chest pain, shortness of breath, weakness you need to be seen immediately.     ED Prescriptions     Medication Sig Dispense Auth. Provider   benzonatate  (TESSALON ) 100 MG capsule Take 1 capsule (100 mg total) by mouth every 8 (eight) hours. 21 capsule Renner Sebald K, PA-C   cetirizine (ZYRTEC) 10 MG tablet Take 1 tablet (10 mg total) by mouth at bedtime. 30 tablet Tressy Kunzman K, PA-C      PDMP not reviewed this encounter.   Budd Cargo, PA-C 04/25/24 1946

## 2024-04-25 NOTE — Discharge Instructions (Signed)
 Your x-ray not show any evidence of pneumonia.  You were negative for COVID and flu.  I suspect you have either allergies or a different virus.  Start cetirizine nightly.  Take Tessalon  for cough.  I also recommend over-the-counter medicine including Mucinex , Tylenol , nasal saline/sinus rinses, gargle with warm salt water.  Use humidifier in your room.  If you are not feeling better in a week please return for reevaluation.  If anything worsens and you have high fever, worsening cough, chest pain, shortness of breath, weakness you need to be seen immediately.

## 2024-04-25 NOTE — ED Triage Notes (Signed)
 Pt reports cough started having a cough that is productive, sneezing, nasal congestion and swelling on right side of neck that radiates to right ear. Adds that left ear itching.  Not taken any medications for symptoms.

## 2024-04-30 ENCOUNTER — Other Ambulatory Visit (HOSPITAL_COMMUNITY): Payer: Self-pay

## 2024-05-02 ENCOUNTER — Other Ambulatory Visit (HOSPITAL_COMMUNITY): Payer: Self-pay

## 2024-05-02 ENCOUNTER — Ambulatory Visit (INDEPENDENT_AMBULATORY_CARE_PROVIDER_SITE_OTHER): Payer: Medicaid Other | Admitting: Podiatry

## 2024-05-02 ENCOUNTER — Other Ambulatory Visit: Payer: Self-pay

## 2024-05-02 DIAGNOSIS — Z91198 Patient's noncompliance with other medical treatment and regimen for other reason: Secondary | ICD-10-CM

## 2024-05-02 NOTE — Progress Notes (Signed)
 1. Failure to attend appointment with reason given    Patient canceled and rescheduled appointment.

## 2024-05-03 ENCOUNTER — Other Ambulatory Visit: Payer: Self-pay | Admitting: Internal Medicine

## 2024-05-03 ENCOUNTER — Ambulatory Visit: Admitting: Podiatry

## 2024-05-03 ENCOUNTER — Other Ambulatory Visit: Payer: Self-pay

## 2024-05-03 DIAGNOSIS — M059 Rheumatoid arthritis with rheumatoid factor, unspecified: Secondary | ICD-10-CM

## 2024-05-03 DIAGNOSIS — M359 Systemic involvement of connective tissue, unspecified: Secondary | ICD-10-CM

## 2024-05-03 MED ORDER — TYENNE 162 MG/0.9ML ~~LOC~~ SOAJ: 162.0000 mg | SUBCUTANEOUS | 0 refills | Status: DC

## 2024-05-03 NOTE — Addendum Note (Signed)
 Addended by: DAYNE SHERRY RAMAN on: 05/03/2024 09:59 AM   Modules accepted: Orders

## 2024-05-23 ENCOUNTER — Other Ambulatory Visit: Payer: Self-pay | Admitting: Cardiovascular Disease

## 2024-05-25 ENCOUNTER — Other Ambulatory Visit: Payer: Self-pay | Admitting: *Deleted

## 2024-05-25 DIAGNOSIS — J8489 Other specified interstitial pulmonary diseases: Secondary | ICD-10-CM

## 2024-05-28 ENCOUNTER — Ambulatory Visit: Admitting: Internal Medicine

## 2024-05-28 ENCOUNTER — Ambulatory Visit (INDEPENDENT_AMBULATORY_CARE_PROVIDER_SITE_OTHER): Admitting: Internal Medicine

## 2024-05-28 ENCOUNTER — Encounter: Payer: Self-pay | Admitting: Internal Medicine

## 2024-05-28 VITALS — BP 116/78 | HR 72 | Ht 62.7 in | Wt 236.0 lb

## 2024-05-28 DIAGNOSIS — Z79899 Other long term (current) drug therapy: Secondary | ICD-10-CM

## 2024-05-28 DIAGNOSIS — M359 Systemic involvement of connective tissue, unspecified: Secondary | ICD-10-CM

## 2024-05-28 DIAGNOSIS — Z7952 Long term (current) use of systemic steroids: Secondary | ICD-10-CM

## 2024-05-28 DIAGNOSIS — J8489 Other specified interstitial pulmonary diseases: Secondary | ICD-10-CM

## 2024-05-28 DIAGNOSIS — D849 Immunodeficiency, unspecified: Secondary | ICD-10-CM

## 2024-05-28 DIAGNOSIS — Z87891 Personal history of nicotine dependence: Secondary | ICD-10-CM

## 2024-05-28 LAB — PULMONARY FUNCTION TEST
DL/VA % pred: 79 %
DL/VA: 3.37 ml/min/mmHg/L
DLCO cor % pred: 55 %
DLCO cor: 10.6 ml/min/mmHg
DLCO unc % pred: 55 %
DLCO unc: 10.6 ml/min/mmHg
FEF 25-75 Pre: 2.84 L/s
FEF2575-%Pred-Pre: 138 %
FEV1-%Pred-Pre: 74 %
FEV1-Pre: 1.71 L
FEV1FVC-%Pred-Pre: 115 %
FEV6-%Pred-Pre: 66 %
FEV6-Pre: 1.93 L
FEV6FVC-%Pred-Pre: 104 %
FVC-%Pred-Pre: 63 %
FVC-Pre: 1.93 L
Pre FEV1/FVC ratio: 89 %
Pre FEV6/FVC Ratio: 100 %

## 2024-05-28 MED ORDER — PREDNISONE 5 MG PO TABS: 5.0000 mg | ORAL_TABLET | Freq: Every day | ORAL | 5 refills | Status: DC

## 2024-05-28 MED ORDER — SULFAMETHOXAZOLE-TRIMETHOPRIM 800-160 MG PO TABS: 1.0000 | ORAL_TABLET | ORAL | 11 refills | Status: AC

## 2024-05-28 NOTE — Progress Notes (Signed)
 OV 04/08/2022 -rheumatoid arthritis immunosuppressed.  Has interstitial lung disease findings.  Subjective:  Patient ID: Kathleen Kane, female , DOB: 11/12/1960 , age 63 y.o. , MRN: [id number] , ADDRESS: [address] [place] [address] PCP [name], MD Patient Care Team: [name], MD as PCP - General (Internal Medicine) Delford Maude BROCKS, MD as PCP - Cardiology (Cardiology)  This Provider for this visit: Treatment Team:  Attending Provider: Geronimo Amel, MD    04/08/2022 -   Chief Complaint  Patient presents with   Consult    Pt recently had some imaging performed which is the reason for today's visit. States she was also recently in the hospital. Pt does have current complaints of cough which is worse at night.     HPI Kathleen Kane 63 y.o. -history provided by review of the chart talking to her and the daughter.  Obese lady presenting with her daughter.  She has a long history of rheumatoid arthritis diagnosed 8 years ago according to history.  She used to be a patient of Dr. Dolphus but after having failed different immunosuppression agents [efficacy issues versus side effect such as infections] she has been discharged from follow-up at the rheumatology clinic.  Recommendation was to do prednisone .  Last face-to-face visit was in December 2022 at rheumatolog wth Dr D and with the PA Cheryl Birmingham in March 2023.  Rheumatoid arthritis history: She has RA and osteoarthritis.  Diagnosis seropositive although I could not locate seropositivity in the chart.  Charts indicate that previously she been in a great response to Plaquenil, Humira and Remicade.  Deemed poor candidate for TNF alpha inhibitors due to history of congestive heart failure.  Most recently she was on Orencia  and Rasuvo  but it appears that she stopped this in October 28, 2011 for recurrent bouts of pneumonia as well as wound infection of the length of the inguinal region.  In March 2023  she expressed concern about restarting these agents because of concern for side effects.  She expressed interest in doing long-term prednisone  to rheumatology in March 2023.  Patient was advised against using long-term prednisone .  She then wanted to see a different rheumatologist.  Referral was made but according to the patient nobody will see her because she has Medicaid.  She has been on chronic steroids according to the chart review since 2015.  But she tells me that she only takes it intermittently although chart says she is wanting to take this and takes it continuously.  She also has osteoarthritis of the knees.  She has idiopathic gout.  She has rheumatoid nodules  Other pertinent past medical history include: OSA unclear if she is taking CPAP, chronic atrial fibrillation sees Dr. Julee on Eliquis  and metoprolol , chronic diastolic heart failure, type 2 diabetes, vitamin D deficiency.  She has chronic anemia.  Hemoglobin in May 2023 was 10 g% and baseline.   Current: In the back of all this she was admitted 03/11/2022 for 4 days and discharged 03/15/2022 with a history of hemoptysis for [redacted] week along with acute hypoxemic respiratory failure requiring 4 L nasal cannula.  An echocardiogram apparently showed restricted posterior mitral valve leaflet with moderate mitral regurgitation EF 55%.  [Several months ago she seemed to have a high elevated pulmonary pressures on echo].  According to her in the chart she was treated with oxygen therapy antibiotics steroids and diuresis.  She did improve.  She is off oxygen.  She says  she is also off steroids at this point.  She had a CT scan of the chest there was angiogram.  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  Therefore she has been referred here.  Her daughter states that they are aware of ILD changes even a few years ago [2019 high-resolution CT chest showed some groundglass opacities and that the only other CT scan of the chest].  At this point  in time she is back to baseline there is no more hemoptysis she is able to do ADLs.  She does use a wheelchair when she goes shopping out of the doctor's office but she is able to walk around the house.  In fact today she was able to walk 2 out of the 3 laps and stopped because of joint issues and fatigue.  She did not desaturate.   Lab review: Most recent creatinine 1 mg percent 03/15/2022  04/19/2022 Follow up : RA-ILD  Patient returns for a 2-week follow-up.  Patient was seen last visit for a pulmonary consult for interstitial lung disease.  Patient has underlying rheumatoid arthritis.  Says she has been on multiple treatments in the past including methotrexate , Humira, Orencia  but is currently off of all therapy due to intolerance.  Patient says that she has been discharged from her rheumatologist recently and needs a new rheumatologist.  Patient was set up for high-resolution CT chest completed on April 16, 2022 that shows patchy pulmonary parenchymal groundglass, coarsening bronchiectasis and subpleural reticulation.  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  Mediastinal adenopathy in the setting of ILD and appears slightly improved from CT on Mar 09, 2022.  Lab work shows decrease sed rate at 37, positive CCP greater than 250, negative ANA, positive rheumatoid factor at 904, decreased BNP at 169.  QuantiFERON gold was -1-year ago most recent test showed indeterminate.  2D echo is pending.  Was admitted to the hospital early May for acute respiratory failure felt secondary to possible atypical pneumonia.  She was treated with empiric antibiotics and steroids.  Along with nebulized bronchodilators.  Weaned off of oxygen prior to discharge. Patient says overall she is actually feeling better since discharge.  She has had less joint swelling and pain.  Breathing has been better.  She says since coming off the  methotrexate  she is felt significantly improved. She was set up for pulmonary function testing completed on April 13, 2022 that showed no airflow obstruction or restriction.  FEV1 116%, ratio 91, FVC 99%, positive bronchodilator response, DLCO decreased at 54%.SABRA  She was set up for an overnight oximetry test.  This is showed minimum desaturations with O2 saturations less than 88%.  7 minutes and 40 seconds.  *Test results.  Patient wants to hold off on starting oxygen.   T/EVENTS :  04/14/2022 ONO <88% for 7 minutes and 40 seconds  CT chest Mar 09, 2022 shows extensive fibrotic changes with associated bronchiectasis bilaterally   Echo 04/22/22 - IMPRESSIONS     1. Left ventricular ejection fraction, by estimation, is 60 to 65%. The  left ventricle has normal function. The left ventricle has no regional  wall motion abnormalities. There is moderate concentric left ventricular  hypertrophy. Left ventricular  diastolic parameters are indeterminate.   2. Right ventricular systolic function is normal. The right ventricular  size is normal.   3. Left atrial size was moderately dilated.   4. The mitral valve is  normal in structure. Mild mitral valve  regurgitation.   5. The aortic valve is normal in structure. Aortic valve regurgitation is  not visualized.   6. The inferior vena cava is normal in size with greater than 50%  respiratory variability, suggesting right atrial pressure of 3 mmHg.   OV 07/27/2022  Subjective:  Patient ID: Kathleen Kane, female , DOB: 1961/04/26 , age 30 y.o. , MRN: [id number] , ADDRESS: [address] [place] [address] PCP [name], MD Patient Care Team: [name], MD as PCP - General (Internal Medicine) Delford Maude BROCKS, MD as PCP - Cardiology (Cardiology)  This Provider for this visit: Treatment Team:  Attending Provider: Geronimo Amel, MD    07/27/2022 -   Chief Complaint  Patient presents with   Follow-up    SOB/ cough improved      HPI Kathleen Kane 63 y.o. -presents with daughter Hospital doctor.  She is reports continued improvement in her respiratory status particularly after stopping methotrexate  in the hospitalization.  She had a CT scan of the chest a few months ago and this showed NSIP pattern of ILD.  She believes that this is stable.  However she is dealing with significant joint pain and this is making her short of breath.  She thinks her joint pain is because of rheumatoid arthritis and her weight causing shortness of breath.  She not having any cough.  The cough is significantly improved.  She is frustrated that no rheumatologist will see her.  OV 10/19/2022  Subjective:  Patient ID: Kathleen Kane, female , DOB: 02/28/1961 , age 59 y.o. , MRN: [id number] , ADDRESS: [address] [place] [address] PCP [name], MD Patient Care Team: [name], MD as PCP - General (Internal Medicine) Delford Maude BROCKS, MD as PCP - Cardiology (Cardiology)  This Provider for this visit: Treatment Team:  Attending Provider: Geronimo Amel, MD    10/19/2022 -   Chief Complaint  Patient presents with   Follow-up    PFT performed today.  Pt states her breathing is about the same since last visit and also has complaints of a cough.     HPI OCTAVIE WESTERHOLD 63 y.o. -with rheumatoid arthritis.  Obese lady.  Presents with her daughter.  Since her last visit she continues to remain symptomatic with more shortness of breath on exertion [combination of ILD and also obesity and rheumatoid arthritis] and also severe joint pain.  She feels her respiratory status is stable.  She is not able to see her rheumatologist anymore because of refractoriness to treatment but she is in miserable pain.  We discussed the potential of starting tocilizumab .  However her QuantiFERON gold ended up being indeterminate x 2.  So we asked her primary care physician to do a PPD skin test.  She tells me that she just had this and was  negative.  She showed me her left forearm.  Was done last week.  I do not feel any induration.  Her quality of cath is limited by her pain which is quite bad and also her shortness of breath and obesity.  Her current symptom score is listed below.   OV 11/18/2022  Subjective:  Patient ID: Kathleen Kane, female , DOB: 1961-06-22 , age 84 y.o. , MRN: [id number] , ADDRESS: [address] [place] [id number] PCP [name], MD Patient Care Team: [name], MD as PCP - General (Internal Medicine) Delford Maude BROCKS, MD as PCP -  Cardiology (Cardiology)  This Provider for this visit: Treatment Team:  Attending Provider: Geronimo Amel, MD   11/18/2022 -   Chief Complaint  Patient presents with   Follow-up     Doing well.  Discuss PFT 10/17/2023.     HPI SHERL YZAGUIRRE 63 y.o. -returns for follow-up.  At this visit just to see how she is doing after Tocilizumab  start) show this has been started.  Review of the external records indicate that this is being started by the pharmacist.  She has had 1 dose so far.  Her daughter is administering the doses at home.  She is going to get weekly doses.  She continues to be in severe pain.  She rates her pain at 3.5/5.  She described the pain is debilitating and something that she would not wish on anyone.  She is cautiously optimistic that the tocilizumab  would help.  Her respiratory status is stable.  She had pulmonary function test the DLCO stable but the FVC shows significant decline of 17% relative decline..  However his symptom scores are stable.  And the DLCO is also stable.   Most recent labs were from November 2023.    OV 03/01/2023  Subjective:  Patient ID: Kathleen Kane, female , DOB: [birth year] , age 14 y.o. , MRN: [id number] , ADDRESS: [address] [place] [id number] PCP [name], MD Patient Care Team: [name], MD as PCP - General (Internal Medicine) Delford Maude BROCKS, MD as PCP - Cardiology  (Cardiology)  This Provider for this visit: Treatment Team:  Attending Provider: Geronimo Amel, MD   03/01/2023 -   Chief Complaint  Patient presents with   Follow-up    F/up from PFT done yesterday.     HPI ALYSHIA KERNAN 63 y.o. -returns for follow-up.  Presents with her daughter.  Daughter is an independent historian and filled out the symptom score sheet.  Daughter tells me that daughter herself has Sjogren's syndrome and is short of breath.  Daughter also has obesity.  I have asked him to make an appointment with me.  Regarding the patient she feels improved.  She feels more mobile.  She feels the pain is improved.  This because of the Actemra  that I started in January 2024.  She continues on prednisone  10 mg/day.  I asked her to reduce it but she did not want to do it.  For she had pulmonary function test today the FVC is slightly better while the DLCO is down.  She is feeling stable.  We overall deemed her today is stable but indicated that she needs to have a CT scan and pulmonary function test in 3 months to see if she needs to go on additional medications especially approved antifibrotic nintedanib.  In addition I spoke to her about the ILD-Pro registry.  She is interested if she qualifies.    OV 05/23/2023  Subjective:  Patient ID: Kathleen Kane, female , DOB: [birth year] , age 83 y.o. , MRN: [id number] , ADDRESS: [address] [place] [address] PCP [name], MD Patient Care Team: [name], MD as PCP - General (Internal Medicine) Delford Maude BROCKS, MD as PCP - Cardiology (Cardiology)  This Provider for this visit: Treatment Team:  Attending Provider: Geronimo Amel, MD    05/23/2023 -   Chief Complaint  Patient presents with   Follow-up    F/up on PFT, no complaints     HPI Kathleen Kane 63 y.o. -returns  for follow-up.  Presents with her daughter.  Daughter is an independent historian.  Both attest there are no new medical issues.   Although she did end up in the ER April 16, 2023 for musculoskeletal back issue.  Urine analysis was abnormal.  She was given antibiotics and discharge.  Diagnosis was UTI.  She also had a URI visit 03/19/2023.  There is according to history and review of the external medical record.  From a respiratory standpoint she is stable.  She had pulmonary function test today shows continued stability in fact slight improvement.  Symptoms are stable.  [See below.].  She had CT scan of the chest and it shows pulmonary fibrosis also stable.  I did indicate to her that the combination of prednisone  and Actemra  is working well.  She also reports improvement in pain since starting Actemra .  She is little bit concerned about the diagnosis of hypersensitive pneumonitis reported by the radiologist.  We went over this with her and her daughter.  Daughter indicated that the house is damp and there might be some mold in the house.  I did not indicate that she would need an inspection and make sure there is no mold exposure and no feather pillows.  They are agreeable to this plan.   She has a BMI of greater than 40.  She states that she has poorly controlled diabetes.  Also confirmed on chart review.  She has had discussion about weight loss drugs but apparently they have not started this on her as yet.  She does not know why.  I encouraged that weight loss would be a key component of her overall health Plan and encouraged her to talk to her primary care about this.    CT Chest data - personally visualized and independently interpreted and my findings are: -   CT Chest High Resolution  Result Date: 05/22/2023 CLINICAL DATA:  63 year old female former smoker (quit 10 years ago) with history of interstitial lung disease. Difficulty breathing while lying down. Follow-up study. EXAM: CT CHEST WITHOUT CONTRAST TECHNIQUE: Multidetector CT imaging of the chest was performed following the standard protocol without intravenous contrast.  High resolution imaging of the lungs, as well as inspiratory and expiratory imaging, was performed. RADIATION DOSE REDUCTION: This exam was performed according to the departmental dose-optimization program which includes automated exposure control, adjustment of the mA and/or kV according to patient size and/or use of iterative reconstruction technique. COMPARISON:  Multiple priors, most recently chest CTA 09/29/2022. High-resolution chest CT 04/16/2022. FINDINGS: Cardiovascular: Heart size is enlarged with left atrial dilatation. There is no significant pericardial fluid, thickening or pericardial calcification. There is aortic atherosclerosis, as well as atherosclerosis of the great vessels of the mediastinum and the coronary arteries, including calcified atherosclerotic plaque in the left main, left anterior descending, left circumflex and right coronary arteries. 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 Mediastinum/Nodes: Multiple prominent borderline enlarged mediastinal and bilateral hilar lymph nodes, similar to prior examinations, presumably benign and reactive in the setting of chronic interstitial lung disease. Esophagus is unremarkable in appearance. No axillary lymphadenopathy. Lungs/Pleura: High-resolution images again demonstrate widespread but patchy areas of ground-glass attenuation, septal thickening, subpleural reticulation, thickening of the peribronchovascular interstitium, cylindrical bronchiectasis, peripheral bronchiolectasis and regional areas of architectural distortion scattered throughout the lungs bilaterally, with no discernible craniocaudal gradient. No frank honeycombing confidently identified. No substantial progression of disease compared to the prior study. Inspiratory and expiratory imaging demonstrates extensive air trapping indicative of small airways  disease. No acute consolidative airspace disease. No pleural effusions. No definite  suspicious appearing pulmonary nodules or masses are noted. Upper Abdomen: Aortic atherosclerosis. Musculoskeletal: There are no aggressive appearing lytic or blastic lesions noted in the visualized portions of the skeleton. IMPRESSION: 1. The appearance of the lungs remains compatible with interstitial lung disease, once again considered most compatible with an alternative diagnosis (not usual interstitial pneumonia) per current ATS guidelines. 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 No substantial progression of disease compared to the prior study. 2. Cardiomegaly with left atrial dilatation. 3. Aortic atherosclerosis, in addition to left main and three-vessel coronary artery disease. Please note that although the presence of coronary artery calcium  documents the presence of coronary artery disease, the severity of this disease and any potential stenosis cannot be assessed on this non-gated CT examination. Assessment for potential risk factor modification, dietary therapy or pharmacologic therapy may be warranted, if clinically indicated. Aortic Atherosclerosis (ICD10-I70.0). Electronically Signed   By: Toribio Aye M.D.   On: 05/22/2023 13:42       OV 05/28/2024  Subjective:  Patient ID: Kathleen Kane, female , DOB: December 22, 1960 , age 8 y.o. , MRN: [id number] , ADDRESS: [address] [place] [address] PCP [name], MD Patient Care Team: [name], MD as PCP - General (Internal Medicine) Delford Maude BROCKS, MD as PCP - Cardiology (Cardiology)  This Provider for this visit: Treatment Team:  Attending Provider: Geronimo Amel, MD   ILD in the setting of rheumatoid arthritis discovered following hospitalization for acute on chronic hypoxemic respiratory failure and stopping all her Biologics against rheumatoid  -Last high-resolution CT chest May 2023.  - started actemra  Jan 2024 (PPD neg per hx Dec 2023 - summer 2023 -indeterminate  QuantiFERON gold]  - Normal G6PDH April 2024  Last Echo June 2023 -  Last HRCT June 2023 -> July 2024  #Multiple cardiac issues sees Dr. Maude Delford #Persistent atrial fibrillation #Chronic diastolic dysfunction #Mild mitral regurgitation by echocardiogram June 2023 #Poorly controlled diabetes mellitus #Morbid obesity.  05/28/2024 -   Chief Complaint  Patient presents with   Follow-up    PFT repeated today. Breathing has been stable. No new co's.      HPI HOLLIS OH 63 y.o. -returns for follow-up.  She normally presents with her daughter but today she presents with her husband.  Have not seen her in a year.  It is unclear why she missed appointments.  She continues on tocilizumab  but generic version.  She is also continuing is on 10 mg/day.  She says she is overall stable.  She did have an admission in February 2025 for influenza associated with hyponatremia and acute kidney injury in the ER visit in April 2025 for breast pain but is otherwise stable no surgeries no new other new medical problems.  She is not following with rheumatology.  She says that tocilizumab  and prednisone  has worked well for her and she prefers that I manage the situation.  She is willing to go down on her prednisone  dosage.  She is able to do her ADLs.  She is sitting on a wheelchair but she admits that she is just feeling lazy and therefore she is sitting on the wheelchair.  She had pulmonary function test today and overall it is stable compared to December 2023.       SYMPTOM SCALE - ILD 07/27/2022 Chronic pred 10mg  10/19/2022 Chronic pred 10mg  11/18/2022 Start actemira 03/01/2023 Actemra  _ chronic pred 05/23/2023  Actemra  + chronic pred 05/28/2024 Actermra + chronic pred  Current weight        O2 use ra ra ra ra ra ra  Shortness of Breath 0 -> 5 scale with 5 being worst (score 6 If unable to do)       At rest 1.1 1 1 1  0 2  Simple tasks - showers, clothes change, eating, shaving 2 3 2 2 2 3    Household (dishes, doing bed, laundry) x  x 2 Does not do 3  Shopping x  x 2 Does ot do 3  Walking level at own pace 4 4 3 3 4 3   Walking up Stairs 4 4 4 4 4 4   Total (30-36) Dyspnea Score 11 12 10 10 10 18   How bad is your cough? Hardly coughs anymore 2 mild 1 2 2   How bad is your fatigue No response 3 mod 2 3 3   How bad is nausea 0 0 0 0 0 0  How bad is vomiting?  0 0 0 0 0 0  How bad is diarrhea? 0 0 0 0 2 1  How bad is anxiety? xx 2 mild 0 1 1  How bad is depression xx 2 mild 0 1 1  Any chronic pain - if so where and how bad Unable to function because of severe knee pain and shoulder pain. Pain rated as 100 out of 5 4.5 3.5 3 4  of 5 and imroved 2        Simple office walk 185 feet x  3 laps goal with forehead probe 04/08/2022  05/28/2024 She said she was lazy to do and was in wheel chair  O2 used ra   Number laps completed 2 of 3 laps   Comments about pace slow   Resting Pulse Ox/HR 100% and 80/min   Final Pulse Ox/HR 98% and 148/min   Desaturated </= 88% no   Desaturated <= 3% points no   Got Tachycardic >/= 90/min Yes, A Fib   Symptoms at end of test Fatigue, pain and dyspnea   Miscellaneous comments Was in  Aifb      PFT     Latest Ref Rng & Units 05/28/2024    7:50 AM 05/23/2023    9:02 AM 02/25/2023    9:11 AM 11/16/2022   11:59 AM 10/19/2022    8:52 AM 04/13/2022   12:52 PM  PFT Results  FVC-Pre L 1.93  P 2.05  1.90  1.81  1.94  2.18   FVC-Predicted Pre % 63  P 67  61  60  65  92   FVC-Post L      2.35   FVC-Predicted Post %      99   Pre FEV1/FVC % % 89  P 93  93  90  92  80   Post FEV1/FCV % %      91   FEV1-Pre L 1.71  P 1.90  1.77  1.62  1.79  1.75   FEV1-Predicted Pre % 74  P 81  75  70  78  95   FEV1-Post L      2.15   DLCO uncorrected ml/min/mmHg 10.60  P 10.92  8.48  10.13  9.79  10.11   DLCO UNC% % 55  P 57  44  54  52  54   DLCO corrected ml/min/mmHg 10.60  P 10.75  8.48  10.70  10.33  11.21   DLCO COR %Predicted % 55  P 56  44  57  55  60    DLVA Predicted % 79  P 76  99  80  73  79   TLC L      3.20   TLC % Predicted %      67   RV % Predicted %      -37     P Preliminary result       LAB RESULTS last 96 hours No results found.       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   reports that she quit smoking about 9 years ago. Her smoking use included cigarettes. She started smoking about 47 years ago. She has a 37.6 pack-year smoking history. She has never been exposed to tobacco smoke. She has never used smokeless tobacco.  Past Surgical History:  Procedure Laterality Date   CARDIOVASCULAR STRESS TEST  02/16/2013   no significant EKG changes with Lexiscan , normal LV function and normal wall function   CESAREAN SECTION     x2   DOPPLER ECHOCARDIOGRAPHY  01/24/2008   EF 55-60%, no diagnostic evidence of LV wall motion abnormalities, LV wall thickness was mild-moderately increased.   HAND SURGERY Right 2018   LAPAROTOMY Right 02/27/2013   Procedure: EXPLORATORY LAPAROTOMY, right salpingo-oopherectomy;  Surgeon: Sari Bachelor, MD;  Location: [facility];  Service: Gynecology;  Laterality: Right;   left breast cyst     SALPINGOOPHORECTOMY Right 02/27/2013   Procedure: SALPINGO OOPHORECTOMY;  Surgeon: Sari Bachelor, MD;   Location: [facility];  Service: Gynecology;  Laterality: Right;   TUBAL LIGATION Bilateral 1989    Allergies  Allergen Reactions   Ace Inhibitors Cough   Lisinopril  Cough     There is no immunization history on file for this patient.  Family History  Problem Relation Age of Onset   Hypertension Mother    Diabetes Mother    Throat cancer Mother    Hypertension Father    Diabetes Sister    Heart Problems Sister    Kidney failure Sister    Hypertension Brother    Diabetes Brother    Diabetes Paternal Grandmother    Autoimmune disease Daughter    Healthy Son      Current Outpatient Medications:    Accu-Chek FastClix Lancets MISC, Apply topically., Disp: , Rfl:    ACCU-CHEK GUIDE test strip, , Disp: , Rfl:    atorvastatin  (LIPITOR) 20 MG tablet, TAKE 1 TABLET (20 MG TOTAL) BY MOUTH DAILY. PLEASE SCHEDULE YEARLY APPOINTMENT FOR FUTURE REFILLS. 1ST ATTEMPT. THANK YOU, Disp: 30 tablet, Rfl: 2   B-D ULTRAFINE III SHORT PEN 31G X 8 MM MISC, Inject into the skin 2 (two) times daily., Disp: , Rfl:    benzonatate  (TESSALON ) 100 MG capsule, Take 1 capsule (100 mg total) by mouth every 8 (eight) hours., Disp: 21 capsule, Rfl: 0   blood glucose meter kit and supplies, Dispense based on patient and insurance preference. Use up to four times daily as directed. (FOR ICD-10 E10.9, E11.9)., Disp:  1 each, Rfl: 0   cetirizine  (ZYRTEC ) 10 MG tablet, Take 1 tablet (10 mg total) by mouth at bedtime., Disp: 30 tablet, Rfl: 0   diazepam  (VALIUM ) 5 MG tablet, Take 1 tablet (5 mg total) by mouth every 6 (six) hours as needed for muscle spasms., Disp: 15 tablet, Rfl: 0   ELIQUIS  5 MG TABS tablet, TAKE 1 TABLET BY MOUTH TWICE A DAY (Patient taking differently: Take 5 mg by mouth 2 (two) times daily.), Disp: 60 tablet, Rfl: 10   ferrous sulfate  325 (65 FE) MG tablet, Take 1 tablet (325 mg total) by mouth daily with breakfast., Disp: 30 tablet, Rfl: 3   folic acid  (FOLVITE ) 1 MG tablet, Take 1 mg by mouth every  morning., Disp: , Rfl: 3   furosemide  (LASIX ) 40 MG tablet, Take 1 tablet (40 mg total) by mouth daily., Disp: 45 tablet, Rfl: 0   levalbuterol  (XOPENEX  HFA) 45 MCG/ACT inhaler, Inhale 1-2 puffs into the lungs every 6 (six) hours as needed for wheezing., Disp: 1 each, Rfl: 0   [Paused] losartan  (COZAAR ) 25 MG tablet, TAKE 1 TABLET BY MOUTH EVERY DAY, Disp: 90 tablet, Rfl: 3   metFORMIN  (GLUCOPHAGE ) 500 MG tablet, Take 2 tablets (1,000 mg total) by mouth 2 (two) times daily., Disp: 60 tablet, Rfl: 0   metolazone  (ZAROXOLYN ) 5 MG tablet, TAKE 1 TABLET EVERY OTHER DAY. TAKE 30 MINUTES PRIOR TO TAKING FUROSEMIDE  ( LASIX  ), Disp: 45 tablet, Rfl: 3   metoprolol  tartrate (LOPRESSOR ) 25 MG tablet, TAKE 1 TABLET BY MOUTH TWICE A DAY, Disp: 180 tablet, Rfl: 1   NOVOLOG  MIX 70/30 FLEXPEN (70-30) 100 UNIT/ML FlexPen, Inject 0.4 mLs (40 Units total) into the skin 2 (two) times daily with a meal., Disp: 15 mL, Rfl: 0   nystatin  (MYCOSTATIN /NYSTOP ) powder, Apply 1 Application topically 3 (three) times daily., Disp: 15 g, Rfl: 0   potassium chloride  (KLOR-CON  M) 10 MEQ tablet, TAKE 1 TABLET BY MOUTH 2 TIMES DAILY., Disp: 180 tablet, Rfl: 2   predniSONE  (DELTASONE ) 10 MG tablet, TAKE 1 TABLET BY MOUTH EVERY DAY, Disp: 30 tablet, Rfl: 2   predniSONE  (DELTASONE ) 5 MG tablet, Take 1 tablet (5 mg total) by mouth daily with breakfast., Disp: 30 tablet, Rfl: 5   sulfamethoxazole -trimethoprim  (BACTRIM  DS) 800-160 MG tablet, Take 1 tablet by mouth 3 (three) times a week., Disp: 12 tablet, Rfl: 11   Tocilizumab -aazg (TYENNE ) 162 MG/0.9ML SOAJ, Inject 162 mg into the skin once a week., Disp: 1.8 mL, Rfl: 0      Objective:   Vitals:   05/28/24 0932  BP: 116/78  Pulse: 72  SpO2: 97%  Weight: 236 lb (107 kg)  Height: 5' 2.7 (1.593 m)    Estimated body mass index is 42.21 kg/m as calculated from the following:   Height as of this encounter: 5' 2.7 (1.593 m).   Weight as of this encounter: 236 lb (107  kg).  @WEIGHTCHANGE @  [employer]   05/28/24 0932  Weight: 236 lb (107 kg)     Physical Exam   General: No distress. OBESE.  O2 at rest: no Cane present: no Sitting in wheel chair: YES Frail: no Obese: YES Neuro: Alert and Oriented x 3. GCS 15. Speech normal Psych: Pleasant Resp:  Barrel Chest - no.  Wheeze - no, Crackles - YES, No overt respiratory distress CVS: Normal heart sounds. Murmurs - no Ext: Stigmata of Connective Tissue Disease - RA but not overt. PAIN CONTRIOLLED HEENT: Normal upper airway. PEERL +.  No post nasal drip         Assessment:       ICD-10-CM   1. Interstitial lung disease due to connective tissue disease (HCC)  J84.89 CBC with Differential   M35.9 Comp Met (CMET)    CT Chest High Resolution    2. High risk medication use  Z79.899 CBC with Differential    Comp Met (CMET)    CT Chest High Resolution    3. Current chronic use of systemic steroids  Z79.52 CBC with Differential    Comp Met (CMET)    CT Chest High Resolution    4. Immunosuppressed status (HCC)  D84.9 CBC with Differential    Comp Met (CMET)    CT Chest High Resolution    5. Stopped smoking with greater than 30 pack year history  Z87.891 CBC with Differential    Comp Met (CMET)    CT Chest High Resolution         Plan:     Patient Instructions     ICD-10-CM   1. Interstitial lung disease due to connective tissue disease (HCC)  J84.89    M35.9     2. High risk medication use  Z79.899     3. Current chronic use of systemic steroids  Z79.52     4. Immunosuppressed status (HCC)  D84.9     5. Stopped smoking with greater than 30 pack year history  Z87.891        RA pain improved with Actemra  since jan 2024 and  daily prednisone  10 mg/day. Pulmonary fibrosis itself is stable in the last 2 years especially after starting Actemra .  Plan -Continue biologic Actemra  genetic injection given benefit -Continue prednisone   but reduced from 10 mg/day; to 5 mg per  day -Start Bactrim  1 double strength tablet Monday Wednesday Friday to prevent opportunistic infections  - I told you this last time -= check cbc, bmet, LFT,  05/28/2024  for safety monitoriung  -Any side effect such as rash please let us  know - Do HRCT in 5 months -Talk to primary care doctor about weight loss drugs suggest Ozempic etc.,    Follow-up - Return in  5 months but after  HRCT to see APP  -Symptom score and walk test at follow-up  - if there is progession consider anti fibrotic   FOLLOWUP Return in 5 months (on 10/28/2024) for 5 months return to see APP afgter CT, with any of the APPS, after HRCT chest, Face to Face Visit.    SIGNATURE    Dr. Dorethia Cave, M.D., F.C.C.P,  Pulmonary and Critical Care Medicine Staff Physician, [facility] Director - Interstitial Lung Disease  Program  Pulmonary Fibrosis [facility] at [facility] [place], [place], [address]  Pager: [phone], If no answer or between  15:00h - 7:00h: call [phone] Telephone: [phone]  3:19 PM 05/28/2024

## 2024-05-28 NOTE — Progress Notes (Signed)
 Spirometry/Dlco performed today.

## 2024-05-28 NOTE — Patient Instructions (Signed)
 Spirometry/Dlco performed today.

## 2024-05-28 NOTE — Patient Instructions (Addendum)
 ICD-10-CM   1. Interstitial lung disease due to connective tissue disease (HCC)  J84.89    M35.9     2. High risk medication use  Z79.899     3. Current chronic use of systemic steroids  Z79.52     4. Immunosuppressed status (HCC)  D84.9     5. Stopped smoking with greater than 30 pack year history  Z87.891        RA pain improved with Actemra  since jan 2024 and  daily prednisone  10 mg/day. Pulmonary fibrosis itself is stable in the last 2 years especially after starting Actemra .  Plan -Continue biologic Actemra  genetic injection given benefit -Continue prednisone   but reduced from 10 mg/day; to 5 mg per day -Start Bactrim  1 double strength tablet Monday Wednesday Friday to prevent opportunistic infections  - I told you this last time -= check cbc, bmet, LFT,  05/28/2024  for safety monitoriung  -Any side effect such as rash please let us  know - Do HRCT in 5 months -Talk to primary care doctor about weight loss drugs suggest Ozempic etc.,    Follow-up - Return in  5 months but after  HRCT to see APP  -Symptom score and walk test at follow-up  - if there is progession consider anti fibrotic

## 2024-05-29 ENCOUNTER — Other Ambulatory Visit

## 2024-05-29 ENCOUNTER — Other Ambulatory Visit: Payer: Self-pay | Admitting: Internal Medicine

## 2024-05-29 DIAGNOSIS — M059 Rheumatoid arthritis with rheumatoid factor, unspecified: Secondary | ICD-10-CM

## 2024-05-29 DIAGNOSIS — M359 Systemic involvement of connective tissue, unspecified: Secondary | ICD-10-CM

## 2024-05-29 NOTE — Telephone Encounter (Signed)
 Pending lab from 05/31/2024, will refill  Patient wanted to switch back to [facility] for medication so will fill based on stock there. Confirming with staff

## 2024-05-31 ENCOUNTER — Other Ambulatory Visit

## 2024-06-01 ENCOUNTER — Other Ambulatory Visit: Payer: Self-pay

## 2024-06-04 ENCOUNTER — Other Ambulatory Visit: Payer: Self-pay

## 2024-06-04 ENCOUNTER — Other Ambulatory Visit

## 2024-06-05 ENCOUNTER — Encounter: Payer: Self-pay | Admitting: Podiatry

## 2024-06-05 ENCOUNTER — Ambulatory Visit (INDEPENDENT_AMBULATORY_CARE_PROVIDER_SITE_OTHER): Admitting: Podiatry

## 2024-06-05 DIAGNOSIS — B351 Tinea unguium: Secondary | ICD-10-CM | POA: Diagnosis not present

## 2024-06-05 DIAGNOSIS — E1142 Type 2 diabetes mellitus with diabetic polyneuropathy: Secondary | ICD-10-CM

## 2024-06-05 DIAGNOSIS — M79674 Pain in right toe(s): Secondary | ICD-10-CM

## 2024-06-05 DIAGNOSIS — M79675 Pain in left toe(s): Secondary | ICD-10-CM

## 2024-06-05 NOTE — Progress Notes (Signed)
 This patient returns to my office for at risk foot care.  This patient requires this care by a professional since this patient will be at risk due to having diabetes.  This patient is unable to cut nails herself since the patient cannot reach her nails.These nails are painful walking and wearing shoes. She says all her nails were remobed and only the third toenail right foot has regrown. This patient presents for at risk foot care today.  General Appearance  Alert, conversant and in no acute stress.  Vascular  Dorsalis pedis and posterior tibial  pulses are palpable  bilaterally.  Capillary return is within normal limits  bilaterally. Temperature is within normal limits  bilaterally.  Neurologic  Senn-Weinstein monofilament wire test within normal limits  bilaterally. Muscle power within normal limits bilaterally.  Nails Thick disfigured discolored nails with subungual debris  third toenail right foot. No evidence of bacterial infection or drainage bilaterally.  Orthopedic  No limitations of motion  feet .  No crepitus or effusions noted.  No bony pathology or digital deformities noted.  Skin  normotropic skin with no porokeratosis noted bilaterally.  No signs of infections or ulcers noted.     Onychomycosis  Pain in right toes  Pain in left toes  Consent was obtained for treatment procedures.   Mechanical debridement of nails 1-5  bilaterally performed with a nail nipper.  Filed with dremel without incident.    Return office visit    3 months                 Told patient to return for periodic foot care and evaluation due to potential at risk complications.   Helane Gunther DPM

## 2024-06-06 ENCOUNTER — Other Ambulatory Visit (INDEPENDENT_AMBULATORY_CARE_PROVIDER_SITE_OTHER)

## 2024-06-06 ENCOUNTER — Other Ambulatory Visit: Payer: Self-pay | Admitting: Pharmacist

## 2024-06-06 ENCOUNTER — Other Ambulatory Visit: Payer: Self-pay | Admitting: Internal Medicine

## 2024-06-06 ENCOUNTER — Other Ambulatory Visit

## 2024-06-06 DIAGNOSIS — M059 Rheumatoid arthritis with rheumatoid factor, unspecified: Secondary | ICD-10-CM

## 2024-06-06 DIAGNOSIS — J8489 Other specified interstitial pulmonary diseases: Secondary | ICD-10-CM

## 2024-06-06 DIAGNOSIS — Z7952 Long term (current) use of systemic steroids: Secondary | ICD-10-CM | POA: Diagnosis not present

## 2024-06-06 DIAGNOSIS — D849 Immunodeficiency, unspecified: Secondary | ICD-10-CM

## 2024-06-06 DIAGNOSIS — Z87891 Personal history of nicotine dependence: Secondary | ICD-10-CM | POA: Diagnosis not present

## 2024-06-06 DIAGNOSIS — Z79899 Other long term (current) drug therapy: Secondary | ICD-10-CM

## 2024-06-06 DIAGNOSIS — Z5181 Encounter for therapeutic drug level monitoring: Secondary | ICD-10-CM

## 2024-06-06 DIAGNOSIS — M359 Systemic involvement of connective tissue, unspecified: Secondary | ICD-10-CM

## 2024-06-06 LAB — COMPREHENSIVE METABOLIC PANEL WITH GFR
ALT: 16 U/L (ref 0–35)
AST: 12 U/L (ref 0–37)
Albumin: 4.4 g/dL (ref 3.5–5.2)
Alkaline Phosphatase: 49 U/L (ref 39–117)
BUN: 40 mg/dL — ABNORMAL HIGH (ref 6–23)
CO2: 34 meq/L — ABNORMAL HIGH (ref 19–32)
Calcium: 10.7 mg/dL — ABNORMAL HIGH (ref 8.4–10.5)
Chloride: 89 meq/L — ABNORMAL LOW (ref 96–112)
Creatinine, Ser: 2.11 mg/dL — ABNORMAL HIGH (ref 0.40–1.20)
GFR: 24.18 mL/min — ABNORMAL LOW (ref >60.00)
Glucose, Bld: 282 mg/dL — ABNORMAL HIGH (ref 70–99)
Potassium: 3 meq/L — ABNORMAL LOW (ref 3.5–5.1)
Sodium: 137 meq/L (ref 135–145)
Total Bilirubin: 1 mg/dL (ref 0.2–1.2)
Total Protein: 7.1 g/dL (ref 6.0–8.3)

## 2024-06-06 NOTE — Telephone Encounter (Signed)
 Copied from CRM [id number]. Topic: Clinical - Medication Refill >> Jun 06, 2024  3:19 PM Leila C wrote: Most Recent Pulmonary Care Visit:  Provider: Dr. Dorethia Cave  Department: [facility] Date: 05/28/24  Medication(s): Tocilizumab -aazg (TYENNE ) 162 MG/0.9ML SOAJ   Has the patient contacted their pharmacy? Yes (Agent: If no, request that the patient contact the pharmacy for the refill. If patient does not wish to contact the pharmacy document the reason why and proceed with request.) (Agent: If yes, when and what did the pharmacy advise?)  This is the patient's preferred pharmacy:  [facility] - [place], [place] - [address] [place] [address] Phone: [phone] Fax: [phone]  Is this the correct pharmacy for this prescription? Yes If no, delete pharmacy and type the correct one.   Has the prescription been filled recently? No  Is the patient out of the medication? Yes  Has the patient been seen for an appointment in the last year OR does the patient have an upcoming appointment? Yes, 10/30/24 with NP, Parrett  Can we respond through MyChart? No, call [phone] if needed  Agent: Please be advised that Rx refills may take up to 3 business days. We ask that you follow-up with your pharmacy.

## 2024-06-06 NOTE — Progress Notes (Signed)
 Order for CBC w diff placed  Sherry Pennant, PharmD, MPH, BCPS, CPP Clinical Pharmacist (Rheumatology and Pulmonology)

## 2024-06-07 ENCOUNTER — Other Ambulatory Visit: Payer: Self-pay

## 2024-06-07 MED ORDER — TYENNE 162 MG/0.9ML ~~LOC~~ SOAJ
162.0000 mg | SUBCUTANEOUS | 0 refills | Status: DC
Start: 2024-06-07 — End: 2024-06-13
  Filled 2024-06-08: qty 1.8, 14d supply, fill #0
  Filled 2024-06-08: qty 1.8, fill #0

## 2024-06-08 ENCOUNTER — Other Ambulatory Visit: Payer: Self-pay

## 2024-06-08 ENCOUNTER — Other Ambulatory Visit (HOSPITAL_COMMUNITY): Payer: Self-pay

## 2024-06-08 ENCOUNTER — Ambulatory Visit: Payer: Self-pay | Admitting: Pharmacist

## 2024-06-08 ENCOUNTER — Other Ambulatory Visit

## 2024-06-08 DIAGNOSIS — M359 Systemic involvement of connective tissue, unspecified: Secondary | ICD-10-CM

## 2024-06-08 DIAGNOSIS — Z79899 Other long term (current) drug therapy: Secondary | ICD-10-CM

## 2024-06-08 DIAGNOSIS — D849 Immunodeficiency, unspecified: Secondary | ICD-10-CM

## 2024-06-08 DIAGNOSIS — M059 Rheumatoid arthritis with rheumatoid factor, unspecified: Secondary | ICD-10-CM

## 2024-06-08 DIAGNOSIS — Z5181 Encounter for therapeutic drug level monitoring: Secondary | ICD-10-CM

## 2024-06-08 LAB — CBC WITH DIFFERENTIAL/PLATELET
Basophils Absolute: 0.1 K/uL (ref 0.0–0.1)
Basophils Relative: 1.3 % (ref 0.0–3.0)
Eosinophils Absolute: 0.6 K/uL (ref 0.0–0.7)
Eosinophils Relative: 13.8 % — ABNORMAL HIGH (ref 0.0–5.0)
HCT: 43.4 % (ref 36.0–46.0)
Hemoglobin: 14.6 g/dL (ref 12.0–15.0)
Lymphocytes Relative: 26.3 % (ref 12.0–46.0)
Lymphs Abs: 1.1 K/uL (ref 0.7–4.0)
MCHC: 33.6 g/dL (ref 30.0–36.0)
MCV: 90.9 fl (ref 78.0–100.0)
Monocytes Absolute: 0.4 K/uL (ref 0.1–1.0)
Monocytes Relative: 10.1 % (ref 3.0–12.0)
Neutro Abs: 2.1 K/uL (ref 1.4–7.7)
Neutrophils Relative %: 48.5 % (ref 43.0–77.0)
Platelets: 226 K/uL (ref 150.0–400.0)
RBC: 4.78 Mil/uL (ref 3.87–5.11)
RDW: 14.5 % (ref 11.5–15.5)
WBC: 4.4 K/uL (ref 4.0–10.5)

## 2024-06-08 NOTE — Progress Notes (Signed)
 Specialty Pharmacy Refill Coordination Note  Kathleen Kane is a 63 y.o. female contacted today regarding refills of specialty medication(s) Tocilizumab -aazg (Tyenne )   Patient requested [name] at [facility] at [place] date: 06/11/24   Medication will be filled on 06/11/24.   Sandi confirmed we can order for Monday, patient aware will wait for text for pickup.

## 2024-06-08 NOTE — Addendum Note (Signed)
 Addended by: Pinchas Reither C on: 06/08/2024 09:59 AM   Modules accepted: Orders

## 2024-06-11 ENCOUNTER — Other Ambulatory Visit (HOSPITAL_COMMUNITY): Payer: Self-pay

## 2024-06-11 ENCOUNTER — Other Ambulatory Visit: Payer: Self-pay

## 2024-06-12 ENCOUNTER — Other Ambulatory Visit (HOSPITAL_COMMUNITY): Payer: Self-pay

## 2024-06-12 ENCOUNTER — Other Ambulatory Visit: Payer: Self-pay

## 2024-06-13 ENCOUNTER — Other Ambulatory Visit: Payer: Self-pay

## 2024-06-13 ENCOUNTER — Other Ambulatory Visit: Payer: Self-pay | Admitting: Internal Medicine

## 2024-06-13 ENCOUNTER — Other Ambulatory Visit (HOSPITAL_COMMUNITY): Payer: Self-pay

## 2024-06-13 DIAGNOSIS — J8489 Other specified interstitial pulmonary diseases: Secondary | ICD-10-CM

## 2024-06-13 DIAGNOSIS — M059 Rheumatoid arthritis with rheumatoid factor, unspecified: Secondary | ICD-10-CM

## 2024-06-13 NOTE — Progress Notes (Signed)
 Patient called that Kathleen Kane  that was picked up on 06/12/24 only had 2 pens for a 2 week supply. Patient was informed that prescription was correct based off the quantity authorized to dispense. A refill request has been sent for a full 30 day supply. Patient to follow up with pharmacy in a few days.

## 2024-06-14 ENCOUNTER — Other Ambulatory Visit (HOSPITAL_COMMUNITY): Payer: Self-pay

## 2024-06-14 ENCOUNTER — Other Ambulatory Visit: Payer: Self-pay | Admitting: Cardiovascular Disease

## 2024-06-14 ENCOUNTER — Other Ambulatory Visit: Payer: Self-pay

## 2024-06-14 DIAGNOSIS — I4811 Longstanding persistent atrial fibrillation: Secondary | ICD-10-CM

## 2024-06-14 MED ORDER — TYENNE 162 MG/0.9ML ~~LOC~~ SOAJ
162.0000 mg | SUBCUTANEOUS | 2 refills | Status: DC
  Filled 2024-06-14 – 2024-06-25 (×3): qty 3.6, 28d supply, fill #0
  Filled 2024-07-18: qty 3.6, 28d supply, fill #1
  Filled 2024-08-13: qty 3.6, 28d supply, fill #2

## 2024-06-14 NOTE — Telephone Encounter (Signed)
 Prescription refill request for Eliquis  received. Indication: Afib  Last office visit: 12/30/23 Daine)  Scr: 2.11 (06/06/24)  Age: 63 Weight: 107kg  Appropriate dose. Refill sent.

## 2024-06-19 NOTE — Telephone Encounter (Signed)
 Copied from CRM [id number]. Topic: Clinical - Prescription Issue >> Jun 19, 2024 10:57 AM Kathleen Kane wrote: Reason for CRM: Patient states that she only received 2 weeks worth of injections of Tocilizumab -aazg (TYENNE ) 162 MG/0.9ML SOAJ and is requesting Dr. Geronimo to send in the other 2 injections as she does not want to miss a dose next Tuesday.   Patient requesting a call when this is complete.  Routing to the RX team as this is a specialty medication.

## 2024-06-21 ENCOUNTER — Other Ambulatory Visit: Payer: Self-pay

## 2024-06-22 ENCOUNTER — Other Ambulatory Visit (HOSPITAL_COMMUNITY): Payer: Self-pay

## 2024-06-25 ENCOUNTER — Other Ambulatory Visit: Payer: Self-pay

## 2024-06-25 ENCOUNTER — Other Ambulatory Visit (HOSPITAL_COMMUNITY): Payer: Self-pay

## 2024-06-25 NOTE — Progress Notes (Signed)
 Specialty Pharmacy Refill Coordination Note  Kathleen Kane is a 63 y.o. female contacted today regarding refills of specialty medication(s) Tocilizumab -aazg (Tyenne )   Patient requested Delivery   Delivery date: 06/26/24   Verified address: [address]   [place] [address]   Medication will be filled on 06/25/24.

## 2024-07-04 ENCOUNTER — Other Ambulatory Visit: Payer: Self-pay

## 2024-07-04 ENCOUNTER — Inpatient Hospital Stay (HOSPITAL_COMMUNITY)
Admission: EM | Admit: 2024-07-04 | Discharge: 2024-07-06 | DRG: 291 | Disposition: A | Discharge status: Home / Self Care | Attending: Internal Medicine | Admitting: Internal Medicine

## 2024-07-04 ENCOUNTER — Encounter (HOSPITAL_COMMUNITY): Payer: Self-pay

## 2024-07-04 ENCOUNTER — Emergency Department (HOSPITAL_COMMUNITY)

## 2024-07-04 DIAGNOSIS — Z7901 Long term (current) use of anticoagulants: Secondary | ICD-10-CM | POA: Diagnosis not present

## 2024-07-04 DIAGNOSIS — I5031 Acute diastolic (congestive) heart failure: Secondary | ICD-10-CM | POA: Diagnosis present

## 2024-07-04 DIAGNOSIS — Z888 Allergy status to other drugs, medicaments and biological substances status: Secondary | ICD-10-CM

## 2024-07-04 DIAGNOSIS — Z833 Family history of diabetes mellitus: Secondary | ICD-10-CM | POA: Diagnosis not present

## 2024-07-04 DIAGNOSIS — M069 Rheumatoid arthritis, unspecified: Secondary | ICD-10-CM | POA: Diagnosis present

## 2024-07-04 DIAGNOSIS — E876 Hypokalemia: Secondary | ICD-10-CM | POA: Diagnosis present

## 2024-07-04 DIAGNOSIS — E1122 Type 2 diabetes mellitus with diabetic chronic kidney disease: Secondary | ICD-10-CM | POA: Diagnosis present

## 2024-07-04 DIAGNOSIS — I5033 Acute on chronic diastolic (congestive) heart failure: Secondary | ICD-10-CM | POA: Diagnosis present

## 2024-07-04 DIAGNOSIS — E785 Hyperlipidemia, unspecified: Secondary | ICD-10-CM | POA: Diagnosis present

## 2024-07-04 DIAGNOSIS — Z8249 Family history of ischemic heart disease and other diseases of the circulatory system: Secondary | ICD-10-CM | POA: Diagnosis not present

## 2024-07-04 DIAGNOSIS — Z808 Family history of malignant neoplasm of other organs or systems: Secondary | ICD-10-CM | POA: Diagnosis not present

## 2024-07-04 DIAGNOSIS — E1165 Type 2 diabetes mellitus with hyperglycemia: Secondary | ICD-10-CM | POA: Diagnosis present

## 2024-07-04 DIAGNOSIS — Z7952 Long term (current) use of systemic steroids: Secondary | ICD-10-CM | POA: Diagnosis not present

## 2024-07-04 DIAGNOSIS — Z79899 Other long term (current) drug therapy: Secondary | ICD-10-CM

## 2024-07-04 DIAGNOSIS — Z6838 Body mass index (BMI) 38.0-38.9, adult: Secondary | ICD-10-CM | POA: Diagnosis not present

## 2024-07-04 DIAGNOSIS — R600 Localized edema: Secondary | ICD-10-CM | POA: Diagnosis present

## 2024-07-04 DIAGNOSIS — Z794 Long term (current) use of insulin: Secondary | ICD-10-CM

## 2024-07-04 DIAGNOSIS — E66812 Obesity, class 2: Secondary | ICD-10-CM | POA: Diagnosis present

## 2024-07-04 DIAGNOSIS — Z7984 Long term (current) use of oral hypoglycemic drugs: Secondary | ICD-10-CM

## 2024-07-04 DIAGNOSIS — I130 Hypertensive heart and chronic kidney disease with heart failure and stage 1 through stage 4 chronic kidney disease, or unspecified chronic kidney disease: Principal | ICD-10-CM | POA: Diagnosis present

## 2024-07-04 DIAGNOSIS — J84112 Idiopathic pulmonary fibrosis: Secondary | ICD-10-CM | POA: Diagnosis present

## 2024-07-04 DIAGNOSIS — Z87891 Personal history of nicotine dependence: Secondary | ICD-10-CM | POA: Diagnosis not present

## 2024-07-04 DIAGNOSIS — I4821 Permanent atrial fibrillation: Secondary | ICD-10-CM | POA: Diagnosis present

## 2024-07-04 DIAGNOSIS — Z841 Family history of disorders of kidney and ureter: Secondary | ICD-10-CM

## 2024-07-04 DIAGNOSIS — N1832 Chronic kidney disease, stage 3b: Secondary | ICD-10-CM | POA: Diagnosis present

## 2024-07-04 LAB — COMPREHENSIVE METABOLIC PANEL WITH GFR
ALT: 19 U/L (ref 0–44)
AST: 19 U/L (ref 15–41)
Albumin: 4.4 g/dL (ref 3.5–5.0)
Alkaline Phosphatase: 63 U/L (ref 38–126)
Anion gap: 22 — ABNORMAL HIGH (ref 5–15)
BUN: 45 mg/dL — ABNORMAL HIGH (ref 8–23)
CO2: 29 mmol/L (ref 22–32)
Calcium: 10.9 mg/dL — ABNORMAL HIGH (ref 8.9–10.3)
Chloride: 89 mmol/L — ABNORMAL LOW (ref 98–111)
Creatinine, Ser: 1.75 mg/dL — ABNORMAL HIGH (ref 0.44–1.00)
GFR, Estimated: 32 mL/min — ABNORMAL LOW (ref >60)
Glucose, Bld: 281 mg/dL — ABNORMAL HIGH (ref 70–99)
Potassium: 2.3 mmol/L — CL (ref 3.5–5.1)
Sodium: 140 mmol/L (ref 135–145)
Total Bilirubin: 0.8 mg/dL (ref 0.0–1.2)
Total Protein: 7 g/dL (ref 6.5–8.1)

## 2024-07-04 LAB — GLUCOSE, CAPILLARY
Glucose-Capillary: 406 mg/dL — ABNORMAL HIGH (ref 70–99)
Glucose-Capillary: 301 mg/dL — ABNORMAL HIGH (ref 70–99)
Glucose-Capillary: 264 mg/dL — ABNORMAL HIGH (ref 70–99)

## 2024-07-04 LAB — CBC
HCT: 47.6 % — ABNORMAL HIGH (ref 36.0–46.0)
Hemoglobin: 15.7 g/dL — ABNORMAL HIGH (ref 12.0–15.0)
MCH: 29.6 pg (ref 26.0–34.0)
MCHC: 33 g/dL (ref 30.0–36.0)
MCV: 89.8 fL (ref 80.0–100.0)
Platelets: 262 K/uL (ref 150–400)
RBC: 5.3 MIL/uL — ABNORMAL HIGH (ref 3.87–5.11)
RDW: 13.9 % (ref 11.5–15.5)
WBC: 4.4 K/uL (ref 4.0–10.5)
nRBC: 0 % (ref 0.0–0.2)

## 2024-07-04 LAB — PRO BRAIN NATRIURETIC PEPTIDE: Pro Brain Natriuretic Peptide: 525 pg/mL — ABNORMAL HIGH (ref <300.0)

## 2024-07-04 LAB — TROPONIN T, HIGH SENSITIVITY
Troponin T High Sensitivity: 51 ng/L — ABNORMAL HIGH (ref 0–19)
Troponin T High Sensitivity: 46 ng/L — ABNORMAL HIGH (ref 0–19)

## 2024-07-04 LAB — MAGNESIUM: Magnesium: 2.3 mg/dL (ref 1.7–2.4)

## 2024-07-04 LAB — D-DIMER, QUANTITATIVE: D-Dimer, Quant: 7.01 ug{FEU}/mL — ABNORMAL HIGH (ref 0.00–0.50)

## 2024-07-04 MED ORDER — PREDNISONE 5 MG PO TABS
10.0000 mg | ORAL_TABLET | Freq: Once | ORAL | Status: AC
  Administered 2024-07-04: 10 mg via ORAL
  Filled 2024-07-04: qty 2

## 2024-07-04 MED ORDER — FUROSEMIDE 10 MG/ML IJ SOLN
40.0000 mg | Freq: Once | INTRAMUSCULAR | Status: AC
  Administered 2024-07-04: 40 mg via INTRAVENOUS
  Filled 2024-07-04: qty 4

## 2024-07-04 MED ORDER — FERROUS SULFATE 325 (65 FE) MG PO TABS
325.0000 mg | ORAL_TABLET | Freq: Every day | ORAL | Status: DC
  Administered 2024-07-05 – 2024-07-06 (×2): 325 mg via ORAL
  Filled 2024-07-04 (×2): qty 1

## 2024-07-04 MED ORDER — INSULIN ASPART 100 UNIT/ML IJ SOLN
0.0000 [IU] | Freq: Three times a day (TID) | INTRAMUSCULAR | Status: DC
  Administered 2024-07-04: 15 [IU] via SUBCUTANEOUS
  Administered 2024-07-05 (×3): 8 [IU] via SUBCUTANEOUS
  Administered 2024-07-06 (×2): 5 [IU] via SUBCUTANEOUS
  Filled 2024-07-04: qty 0.15

## 2024-07-04 MED ORDER — METOPROLOL TARTRATE 25 MG PO TABS
12.5000 mg | ORAL_TABLET | Freq: Two times a day (BID) | ORAL | Status: DC
  Administered 2024-07-04 – 2024-07-05 (×3): 12.5 mg via ORAL
  Filled 2024-07-04 (×3): qty 1

## 2024-07-04 MED ORDER — ACETAMINOPHEN 650 MG RE SUPP: 650.0000 mg | Freq: Four times a day (QID) | RECTAL | Status: DC | PRN

## 2024-07-04 MED ORDER — LEVALBUTEROL HCL 0.63 MG/3ML IN NEBU: 0.6300 mg | INHALATION_SOLUTION | Freq: Four times a day (QID) | RESPIRATORY_TRACT | Status: DC | PRN

## 2024-07-04 MED ORDER — ONDANSETRON HCL 4 MG PO TABS: 4.0000 mg | ORAL_TABLET | Freq: Four times a day (QID) | ORAL | Status: DC | PRN

## 2024-07-04 MED ORDER — MAGNESIUM SULFATE 2 GM/50ML IV SOLN
2.0000 g | Freq: Once | INTRAVENOUS | Status: AC
  Administered 2024-07-04: 2 g via INTRAVENOUS
  Filled 2024-07-04: qty 50

## 2024-07-04 MED ORDER — POTASSIUM CHLORIDE 10 MEQ/100ML IV SOLN
10.0000 meq | Freq: Once | INTRAVENOUS | Status: AC
  Administered 2024-07-04: 10 meq via INTRAVENOUS
  Filled 2024-07-04: qty 100

## 2024-07-04 MED ORDER — ONDANSETRON HCL 4 MG/2ML IJ SOLN: 4.0000 mg | Freq: Four times a day (QID) | INTRAMUSCULAR | Status: DC | PRN

## 2024-07-04 MED ORDER — INSULIN ASPART 100 UNIT/ML IJ SOLN
0.0000 [IU] | Freq: Every day | INTRAMUSCULAR | Status: DC
  Administered 2024-07-04: 4 [IU] via SUBCUTANEOUS
  Administered 2024-07-05: 2 [IU] via SUBCUTANEOUS
  Filled 2024-07-04: qty 0.05

## 2024-07-04 MED ORDER — POTASSIUM CHLORIDE CRYS ER 20 MEQ PO TBCR
40.0000 meq | EXTENDED_RELEASE_TABLET | Freq: Once | ORAL | Status: AC
  Administered 2024-07-04: 40 meq via ORAL
  Filled 2024-07-04: qty 2

## 2024-07-04 MED ORDER — SULFAMETHOXAZOLE-TRIMETHOPRIM 800-160 MG PO TABS
1.0000 | ORAL_TABLET | ORAL | Status: DC
  Administered 2024-07-04 – 2024-07-06 (×2): 1 via ORAL
  Filled 2024-07-04 (×2): qty 1

## 2024-07-04 MED ORDER — FUROSEMIDE 10 MG/ML IJ SOLN
40.0000 mg | Freq: Two times a day (BID) | INTRAMUSCULAR | Status: DC
  Administered 2024-07-04: 40 mg via INTRAVENOUS
  Filled 2024-07-04: qty 4

## 2024-07-04 MED ORDER — ACETAMINOPHEN 325 MG PO TABS
650.0000 mg | ORAL_TABLET | Freq: Once | ORAL | Status: AC
  Administered 2024-07-04: 650 mg via ORAL
  Filled 2024-07-04: qty 2

## 2024-07-04 MED ORDER — ACETAMINOPHEN 325 MG PO TABS
650.0000 mg | ORAL_TABLET | Freq: Four times a day (QID) | ORAL | Status: DC | PRN
Start: 2024-07-04 — End: 2024-07-06
  Administered 2024-07-04 – 2024-07-06 (×3): 650 mg via ORAL
  Filled 2024-07-04 (×3): qty 2

## 2024-07-04 MED ORDER — TRAZODONE HCL 50 MG PO TABS
25.0000 mg | ORAL_TABLET | Freq: Every evening | ORAL | Status: DC | PRN
Start: 2024-07-04 — End: 2024-07-06
  Filled 2024-07-04: qty 1

## 2024-07-04 MED ORDER — DIAZEPAM 5 MG PO TABS
5.0000 mg | ORAL_TABLET | Freq: Four times a day (QID) | ORAL | Status: DC | PRN
  Administered 2024-07-05: 5 mg via ORAL
  Filled 2024-07-04: qty 1

## 2024-07-04 MED ORDER — MAGNESIUM SULFATE 50 % IJ SOLN: 2.0000 g | Freq: Once | INTRAMUSCULAR | Status: DC

## 2024-07-04 MED ORDER — ASPIRIN 81 MG PO CHEW
324.0000 mg | CHEWABLE_TABLET | Freq: Once | ORAL | Status: AC
  Administered 2024-07-04: 324 mg via ORAL
  Filled 2024-07-04: qty 4

## 2024-07-04 MED ORDER — PREDNISONE 5 MG PO TABS
5.0000 mg | ORAL_TABLET | Freq: Every day | ORAL | Status: DC
  Administered 2024-07-05 – 2024-07-06 (×2): 5 mg via ORAL
  Filled 2024-07-04 (×2): qty 1

## 2024-07-04 MED ORDER — ATORVASTATIN CALCIUM 20 MG PO TABS
20.0000 mg | ORAL_TABLET | Freq: Every day | ORAL | Status: DC
  Administered 2024-07-04 – 2024-07-06 (×3): 20 mg via ORAL
  Filled 2024-07-04 (×2): qty 1
  Filled 2024-07-04: qty 2

## 2024-07-04 MED ORDER — APIXABAN 5 MG PO TABS
5.0000 mg | ORAL_TABLET | Freq: Two times a day (BID) | ORAL | Status: DC
  Administered 2024-07-04 – 2024-07-06 (×5): 5 mg via ORAL
  Filled 2024-07-04 (×3): qty 1
  Filled 2024-07-04: qty 2
  Filled 2024-07-04: qty 1

## 2024-07-04 NOTE — ED Triage Notes (Signed)
 Pt presents to ED from home C/O CP, dyspnea on exertion X 5 days.

## 2024-07-04 NOTE — H&P (Signed)
 History and Physical  Kathleen Kane FMW:[id number] DOB: Mar 23, 1961 DOA: 07/04/2024  PCP: [name], MD   Chief Complaint: Chest discomfort, ankle swelling  HPI: Kathleen Kane is a 63 y.o. female with medical history significant for permanent A-fib on metoprolol  and Eliquis , heart failure with preserved EF, type 2 diabetes, hypertension being admitted to the hospital with 5 days of right-sided chest discomfort, mild orthopnea and lower extremity edema.  Patient states that she typically gets some chest discomfort when she is overloaded, and the symptoms get better when she takes an extra Lasix .  She noticed that she had some lower extremity edema in her ankles and toes, also started getting some chest discomfort about 5 days ago.  Denies pleuritic chest pain, increased cough, or shortness of breath.  However on further questioning states that she does get a little bit of shortness of breath when she lies flat.  Denies any fevers, chills, nausea, vomiting, diarrhea.  She started having the symptoms about 5 days ago, so she started doubling up her Lasix , she was urinating more however her edema and chest discomfort stayed the same.  Therefore she came to the ER for evaluation today.  Review of Systems: Please see HPI for pertinent positives and negatives. A complete 10 system review of systems are otherwise negative.  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 AHI-6.8/hr, during REM-27.3/hr. RDI-25.0/hr, during REM-40.9/hr. 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   Past Surgical History:  Procedure Laterality Date   CARDIOVASCULAR STRESS TEST  02/16/2013   no significant EKG changes with Lexiscan , normal LV function and normal wall function   CESAREAN SECTION     x2   DOPPLER ECHOCARDIOGRAPHY  01/24/2008   EF 55-60%, no diagnostic evidence of LV wall motion abnormalities, LV wall thickness was mild-moderately increased.   HAND SURGERY Right 2018   LAPAROTOMY Right 02/27/2013   Procedure: EXPLORATORY LAPAROTOMY, right salpingo-oopherectomy;  Surgeon: Sari Bachelor, MD;  Location: [facility];  Service: Gynecology;  Laterality: Right;   left breast cyst     SALPINGOOPHORECTOMY Right 02/27/2013   Procedure: SALPINGO OOPHORECTOMY;  Surgeon: Sari Bachelor, MD;  Location: [facility];  Service: Gynecology;  Laterality: Right;   TUBAL LIGATION Bilateral 1989   Social History:  reports that she quit smoking about 10 years ago. Her smoking use included cigarettes. She started smoking about 47 years ago. She has a 37.6 pack-year smoking history. She has never been exposed to tobacco smoke. She has never used smokeless tobacco. She reports that she does not drink alcohol and does not use drugs.  Allergies  Allergen Reactions  Ace Inhibitors Cough   Lisinopril  Cough    Family History  Problem Relation Age of Onset   Hypertension Mother    Diabetes Mother    Throat cancer Mother    Hypertension Father    Diabetes Sister    Heart Problems Sister    Kidney failure Sister    Hypertension Brother    Diabetes Brother    Diabetes Paternal Grandmother     Autoimmune disease Daughter    Healthy Son      Prior to Admission medications   Medication Sig Start Date End Date Taking? Authorizing Provider  apixaban  (ELIQUIS ) 5 MG TABS tablet TAKE 1 TABLET BY MOUTH TWICE A DAY 06/14/24  Yes Delford Maude BROCKS, MD  atorvastatin  (LIPITOR) 20 MG tablet TAKE 1 TABLET (20 MG TOTAL) BY MOUTH DAILY. PLEASE SCHEDULE YEARLY APPOINTMENT FOR FUTURE REFILLS. 1ST ATTEMPT. THANK YOU 01/29/22  Yes Delford Maude BROCKS, MD  cetirizine  (ZYRTEC ) 10 MG tablet Take 1 tablet (10 mg total) by mouth at bedtime. Patient taking differently: Take 10 mg by mouth daily as needed for allergies or rhinitis. 04/25/24  Yes Raspet, Erin K, PA-C  diazepam  (VALIUM ) 5 MG tablet Take 1 tablet (5 mg total) by mouth every 6 (six) hours as needed for muscle spasms. 04/16/23  Yes Dasie Faden, MD  ferrous sulfate  325 (65 FE) MG tablet Take 1 tablet (325 mg total) by mouth daily with breakfast. 03/15/22  Yes Odell Celinda Balo, MD  furosemide  (LASIX ) 40 MG tablet Take 1 tablet (40 mg total) by mouth daily. Patient taking differently: Take 40 mg by mouth daily. May take another tablet daily as needed for swelling or edema 07/06/22  Yes Stanhope, Dorna HERO, FNP  JARDIANCE 10 MG TABS tablet Take 10 mg by mouth daily. 06/24/24  Yes [provider]  metFORMIN  (GLUCOPHAGE ) 500 MG tablet Take 2 tablets (1,000 mg total) by mouth 2 (two) times daily. 10/03/18  Yes Cheryle Page, MD  metolazone  (ZAROXOLYN ) 5 MG tablet TAKE 1 TABLET EVERY OTHER DAY. TAKE 30 MINUTES PRIOR TO TAKING FUROSEMIDE  ( LASIX  ) 02/14/24  Yes Delford Maude BROCKS, MD  metoprolol  tartrate (LOPRESSOR ) 25 MG tablet TAKE 1 TABLET BY MOUTH TWICE A DAY Patient taking differently: Take 12.5 mg by mouth 2 (two) times daily. 06/08/23  Yes Nishan, Peter C, MD  NOVOLOG  MIX 70/30 FLEXPEN (70-30) 100 UNIT/ML FlexPen Inject 0.4 mLs (40 Units total) into the skin 2 (two) times daily with a meal. Patient taking differently: Inject 20-30 Units into the skin 2  (two) times daily with a meal. 20 in am, 30 in pm 10/03/18  Yes Alekh, Kshitiz, MD  potassium chloride  (KLOR-CON  M) 10 MEQ tablet TAKE 1 TABLET BY MOUTH 2 TIMES DAILY. Patient taking differently: Take 10 mEq by mouth daily. 05/24/24  Yes Delford Maude BROCKS, MD  predniSONE  (DELTASONE ) 10 MG tablet TAKE 1 TABLET BY MOUTH EVERY DAY 08/09/23  Yes Geronimo Amel, MD  predniSONE  (DELTASONE ) 5 MG tablet Take 1 tablet (5 mg total) by mouth daily with breakfast. 05/28/24  Yes Geronimo Amel, MD  sulfamethoxazole -trimethoprim  (BACTRIM  DS) 800-160 MG tablet Take 1 tablet by mouth 3 (three) times a week. 05/28/24  Yes Geronimo Amel, MD  tiZANidine (ZANAFLEX) 4 MG tablet Take 4 mg by mouth 2 (two) times daily as needed for muscle spasms. 02/14/24  Yes [provider]  Tocilizumab -aazg (TYENNE ) 162 MG/0.9ML SOAJ Inject 162 mg into the skin once a week. 06/14/24  Yes Geronimo Amel, MD  B-D ULTRAFINE III  SHORT PEN 31G X 8 MM MISC Inject into the skin 2 (two) times daily. 06/30/20   [provider]  benzonatate  (TESSALON ) 100 MG capsule Take 1 capsule (100 mg total) by mouth every 8 (eight) hours. Patient not taking: Reported on 07/04/2024 04/25/24   Raspet, Erin K, PA-C  folic acid  (FOLVITE ) 1 MG tablet Take 1 mg by mouth every morning. Patient not taking: Reported on 07/04/2024 08/19/17   [provider]  levalbuterol  (XOPENEX  HFA) 45 MCG/ACT inhaler Inhale 1-2 puffs into the lungs every 6 (six) hours as needed for wheezing. Patient not taking: Reported on 07/04/2024 03/19/23   Teresa Shelba SAUNDERS, NP  losartan  (COZAAR ) 25 MG tablet TAKE 1 TABLET BY MOUTH EVERY DAY Patient not taking: Reported on 07/04/2024 10/29/19   Delford Maude BROCKS, MD    Physical Exam: BP 128/74 (BP Location: Left Arm)   Pulse 84   Temp 98.1 F (36.7 C) (Oral)   Resp (!) 23   LMP 10/28/2013   SpO2 96%  General:  Alert, oriented, calm, in no acute distress, resting comfortably on room air.  Her husband is at the  bedside. Neck: supple, no masses, trachea mildline  Cardiovascular: Irregularly irregular, no murmurs or rubs, minimal bilateral ankle edema  Respiratory: clear to auscultation bilaterally, some bibasilar rhonchi, no wheezes, no crackles  Abdomen: soft, nontender, nondistended, normal bowel tones heard  Skin: dry, no rashes  Musculoskeletal: no joint effusions, normal range of motion  Psychiatric: appropriate affect, normal speech  Neurologic: extraocular muscles intact, clear speech, moving all extremities with intact sensorium         Labs on Admission:  Basic Metabolic Panel: Recent Labs  Lab 07/04/24 1243  NA 140  K 2.3*  CL 89*  CO2 29  GLUCOSE 281*  BUN 45*  CREATININE 1.75*  CALCIUM  10.9*   Liver Function Tests: Recent Labs  Lab 07/04/24 1243  AST 19  ALT 19  ALKPHOS 63  BILITOT 0.8  PROT 7.0  ALBUMIN 4.4   No results for input(s): LIPASE, AMYLASE in the last 168 hours. No results for input(s): AMMONIA in the last 168 hours. CBC: Recent Labs  Lab 07/04/24 1025  WBC 4.4  HGB 15.7*  HCT 47.6*  MCV 89.8  PLT 262   Cardiac Enzymes: No results for input(s): CKTOTAL, CKMB, CKMBINDEX, TROPONINI in the last 168 hours. BNP (last 3 results) Recent Labs    01/05/24 0845  BNP 74.6    ProBNP (last 3 results) Recent Labs    07/04/24 1025  PROBNP 525.0*    CBG: No results for input(s): GLUCAP in the last 168 hours.  Radiological Exams on Admission: DG Chest 2 View Result Date: 07/04/2024 CLINICAL DATA:  Mid chest pain. EXAM: CHEST - 2 VIEW COMPARISON:  04/25/2024 and CT chest 05/19/2023. FINDINGS: Trachea is midline. Heart is enlarged. Coarsened pulmonary markings, unchanged. No superimposed airspace consolidation or pleural fluid. IMPRESSION: 1. No acute findings. 2. Interstitial lung disease, better evaluated on CT chest 05/19/2023. Electronically Signed   By: Newell Eke M.D.   On: 07/04/2024 10:54   Assessment/Plan Kathleen Kane is a 63 y.o. female with medical history significant for permanent A-fib on metoprolol  and Eliquis , heart failure with preserved EF, type 2 diabetes, hypertension being admitted to the hospital with 5 days of right-sided chest discomfort, mild orthopnea and lower extremity edema.  Acute on chronic heart failure with preserved EF-last echo June 2023 with EF 60%.  Patient presents with mild orthopnea, lower  extremity edema, elevated BNP. -Inpatient admission -Monitor on telemetry -Heart healthy diet with fluid restriction -IV Lasix   Permanent A-fib-metoprolol , Eliquis   Hypokalemia-I suspect this is related to increase in Lasix  dose recently -Check magnesium  -Replete potassium  Chest discomfort-troponin is flat, no acute EKG changes, pain may be related to her rheumatoid arthritis patient also states in retrospect she was moving around some large bags of clothing just prior to the start of the discomfort.  I feel this chest pain is very unlikely to be cardiac in nature, PE extremely unlikely given lack of tachycardia, hypoxia, and patient has overall been compliant with Eliquis .  Type 2 diabetes-carb modified diet, with moderate dose sliding scale  Rheumatoid arthritis-continue home prednisone  5 mg p.o. daily  Hyperlipidemia-Lipitor    Code Status: Full Code  Consults called: None  Admission status: The appropriate patient status for this patient is INPATIENT. Inpatient status is judged to be reasonable and necessary in order to provide the required intensity of service to ensure the patient's safety. The patient's presenting symptoms, physical exam findings, and initial radiographic and laboratory data in the context of their chronic comorbidities is felt to place them at high risk for further clinical deterioration. Furthermore, it is not anticipated that the patient will be medically stable for discharge from the hospital within 2 midnights of admission.    I certify that at the  point of admission it is my clinical judgment that the patient will require inpatient hospital care spanning beyond 2 midnights from the point of admission due to high intensity of service, high risk for further deterioration and high frequency of surveillance required  Time spent: 56 minutes  Danah Reinecke CHRISTELLA Gail MD [facility] Pager [phone]  If 7PM-7AM, please contact night-coverage [email or web address] Password [facility]  07/04/2024, 2:46 PM

## 2024-07-04 NOTE — ED Provider Notes (Signed)
 [place] EMERGENCY DEPARTMENT AT [facility] Provider Note   CSN: [id number] Arrival date & time: 07/04/24  9053     History Chief Complaint  Patient presents with   Chest Pain     Chest Pain  HPI: Kathleen Kane is a 63 y.o. female with history perinent for A-fib on Eliquis , HFrEF (60 to 65% June 2023), T2DM, HTN, elevated BMI, LD not on home oxygen who presents complaining of chest pain and shortness of breath. Patient arrived via POV accompanied by daughter and husband.  History provided by patient and spouse.  No interpreter required during this encounter.  Patient reports that she has had approximately 5 days of chest tightness, located primarily substernally radiating to her right upper extremity and right neck.  She reports that this is associated with shortness of breath.  The shortness of breath worsens with lying flat, improves with sitting up.  She has also noted increased swelling to her bilateral lower extremities over the past 7 days.  Reports that that she has been taking a double dose of her Lasix  for the past 3 to 4 days to help with the swelling, however swelling has persisted.  Reports that she was hoping that the pain will go away on its own, however given the persistence she decided to come to the emergency department.  Denies fever, chills, nausea, vomiting, diarrhea, diaphoresis.  Reports that she is persistently in atrial fibrillation, and she did miss 1 dose of Eliquis  on 8/20  Patient's recorded medical, surgical, social, medication list and allergies were reviewed in the Snapshot window as part of the initial history.   Prior to Admission medications   Medication Sig Start Date End Date Taking? Authorizing Provider  apixaban  (ELIQUIS ) 5 MG TABS tablet TAKE 1 TABLET BY MOUTH TWICE A DAY 06/14/24  Yes Delford Maude BROCKS, MD  atorvastatin  (LIPITOR) 20 MG tablet TAKE 1 TABLET (20 MG TOTAL) BY MOUTH DAILY. PLEASE SCHEDULE YEARLY APPOINTMENT FOR FUTURE  REFILLS. 1ST ATTEMPT. THANK YOU 01/29/22  Yes Delford Maude BROCKS, MD  cetirizine  (ZYRTEC ) 10 MG tablet Take 1 tablet (10 mg total) by mouth at bedtime. Patient taking differently: Take 10 mg by mouth daily as needed for allergies or rhinitis. 04/25/24  Yes Raspet, Erin K, PA-C  diazepam  (VALIUM ) 5 MG tablet Take 1 tablet (5 mg total) by mouth every 6 (six) hours as needed for muscle spasms. 04/16/23  Yes Dasie Faden, MD  ferrous sulfate  325 (65 FE) MG tablet Take 1 tablet (325 mg total) by mouth daily with breakfast. 03/15/22  Yes Odell Celinda Balo, MD  furosemide  (LASIX ) 40 MG tablet Take 1 tablet (40 mg total) by mouth daily. Patient taking differently: Take 40 mg by mouth daily. May take another tablet daily as needed for swelling or edema 07/06/22  Yes Stanhope, Dorna HERO, FNP  JARDIANCE 10 MG TABS tablet Take 10 mg by mouth daily. 06/24/24  Yes [provider]  metFORMIN  (GLUCOPHAGE ) 500 MG tablet Take 2 tablets (1,000 mg total) by mouth 2 (two) times daily. 10/03/18  Yes Cheryle Page, MD  metolazone  (ZAROXOLYN ) 5 MG tablet TAKE 1 TABLET EVERY OTHER DAY. TAKE 30 MINUTES PRIOR TO TAKING FUROSEMIDE  ( LASIX  ) 02/14/24  Yes Delford Maude BROCKS, MD  metoprolol  tartrate (LOPRESSOR ) 25 MG tablet TAKE 1 TABLET BY MOUTH TWICE A DAY Patient taking differently: Take 12.5 mg by mouth 2 (two) times daily. 06/08/23  Yes Delford Maude BROCKS, MD  NOVOLOG  MIX 70/30 FLEXPEN (70-30) 100 UNIT/ML  FlexPen Inject 0.4 mLs (40 Units total) into the skin 2 (two) times daily with a meal. Patient taking differently: Inject 20-30 Units into the skin 2 (two) times daily with a meal. 20 in am, 30 in pm 10/03/18  Yes Alekh, Kshitiz, MD  potassium chloride  (KLOR-CON  M) 10 MEQ tablet TAKE 1 TABLET BY MOUTH 2 TIMES DAILY. Patient taking differently: Take 10 mEq by mouth daily. 05/24/24  Yes Delford Maude BROCKS, MD  predniSONE  (DELTASONE ) 10 MG tablet TAKE 1 TABLET BY MOUTH EVERY DAY 08/09/23  Yes Geronimo Amel, MD  predniSONE   (DELTASONE ) 5 MG tablet Take 1 tablet (5 mg total) by mouth daily with breakfast. 05/28/24  Yes Geronimo Amel, MD  sulfamethoxazole -trimethoprim  (BACTRIM  DS) 800-160 MG tablet Take 1 tablet by mouth 3 (three) times a week. 05/28/24  Yes Geronimo Amel, MD  tiZANidine (ZANAFLEX) 4 MG tablet Take 4 mg by mouth 2 (two) times daily as needed for muscle spasms. 02/14/24  Yes [provider]  Tocilizumab -aazg (TYENNE ) 162 MG/0.9ML SOAJ Inject 162 mg into the skin once a week. 06/14/24  Yes Geronimo Amel, MD  B-D ULTRAFINE III SHORT PEN 31G X 8 MM MISC Inject into the skin 2 (two) times daily. 06/30/20   [provider]  benzonatate  (TESSALON ) 100 MG capsule Take 1 capsule (100 mg total) by mouth every 8 (eight) hours. Patient not taking: Reported on 07/04/2024 04/25/24   Raspet, Erin K, PA-C  folic acid  (FOLVITE ) 1 MG tablet Take 1 mg by mouth every morning. Patient not taking: Reported on 07/04/2024 08/19/17   [provider]  levalbuterol  (XOPENEX  HFA) 45 MCG/ACT inhaler Inhale 1-2 puffs into the lungs every 6 (six) hours as needed for wheezing. Patient not taking: Reported on 07/04/2024 03/19/23   Teresa Shelba SAUNDERS, NP  losartan  (COZAAR ) 25 MG tablet TAKE 1 TABLET BY MOUTH EVERY DAY Patient not taking: Reported on 07/04/2024 10/29/19   Nishan, Peter C, MD     Allergies: Ace inhibitors and Lisinopril    Review of Systems   ROS as per HPI  Physical Exam Updated Vital Signs BP 129/87   Pulse 98   Temp 98.1 F (36.7 C) (Oral)   Resp 20   LMP 10/28/2013   SpO2 98%  Physical Exam Vitals and nursing note reviewed.  Constitutional:      General: She is not in acute distress.    Appearance: She is well-developed.  HENT:     Head: Normocephalic and atraumatic.  Eyes:     Conjunctiva/sclera: Conjunctivae normal.  Cardiovascular:     Rate and Rhythm: Normal rate. Rhythm irregular.     Pulses:          Radial pulses are 2+ on the right side and 2+ on the left side.      Heart sounds: No murmur heard. Pulmonary:     Effort: Pulmonary effort is normal. No respiratory distress.     Breath sounds: Examination of the right-lower field reveals rales. Examination of the left-lower field reveals rales. Rales present.  Abdominal:     Palpations: Abdomen is soft.     Tenderness: There is no abdominal tenderness.  Musculoskeletal:        General: No swelling.     Cervical back: Neck supple.     Right lower leg: No tenderness. Edema (Trace edema of foot) present.     Left lower leg: No tenderness. Edema (Trace edema foot) present.  Skin:    General: Skin is warm and dry.  Capillary Refill: Capillary refill takes less than 2 seconds.  Neurological:     Mental Status: She is alert.  Psychiatric:        Mood and Affect: Mood normal.     ED Course/ Medical Decision Making/ A&P    Procedures Procedures   Medications Ordered in ED Medications  potassium chloride  10 mEq in 100 mL IVPB (0 mEq Intravenous Stopped 07/04/24 1547)    Followed by  potassium chloride  10 mEq in 100 mL IVPB (10 mEq Intravenous New Bag/Given 07/04/24 1546)  predniSONE  (DELTASONE ) tablet 5 mg (has no administration in time range)  diazepam  (VALIUM ) tablet 5 mg (has no administration in time range)  metoprolol  tartrate (LOPRESSOR ) tablet 12.5 mg (has no administration in time range)  atorvastatin  (LIPITOR) tablet 20 mg (has no administration in time range)  sulfamethoxazole -trimethoprim  (BACTRIM  DS) 800-160 MG per tablet 1 tablet (has no administration in time range)  apixaban  (ELIQUIS ) tablet 5 mg (has no administration in time range)  ferrous sulfate  tablet 325 mg (has no administration in time range)  acetaminophen  (TYLENOL ) tablet 650 mg (has no administration in time range)    Or  acetaminophen  (TYLENOL ) suppository 650 mg (has no administration in time range)  traZODone  (DESYREL ) tablet 25 mg (has no administration in time range)  ondansetron  (ZOFRAN ) tablet 4 mg (has no  administration in time range)    Or  ondansetron  (ZOFRAN ) injection 4 mg (has no administration in time range)  insulin  aspart (novoLOG ) injection 0-15 Units (has no administration in time range)  insulin  aspart (novoLOG ) injection 0-5 Units (has no administration in time range)  levalbuterol  (XOPENEX ) nebulizer solution 0.63 mg (has no administration in time range)  furosemide  (LASIX ) injection 40 mg (has no administration in time range)  aspirin  chewable tablet 324 mg (324 mg Oral Given 07/04/24 1031)  furosemide  (LASIX ) injection 40 mg (40 mg Intravenous Given 07/04/24 1031)  predniSONE  (DELTASONE ) tablet 10 mg (10 mg Oral Given 07/04/24 1328)  acetaminophen  (TYLENOL ) tablet 650 mg (650 mg Oral Given 07/04/24 1328)  potassium chloride  SA (KLOR-CON  M) CR tablet 40 mEq (40 mEq Oral Given 07/04/24 1457)  magnesium  sulfate IVPB 2 g 50 mL (2 g Intravenous New Bag/Given 07/04/24 1455)    Medical Decision Making:   Kathleen Kane is a 63 y.o. female who presents for chest pain and shortness of breath as per above.  Physical exam is pertinent for trace edema bilateral feet, bibasilar rales.   The differential includes but is not limited to heart failure exacerbation, ACS, arrhythmia, pericardial tamponade, pericarditis, myocarditis, pneumonia, pneumothorax, esophageal, tear, perforated abdominal viscous, pulmonary embolism, aortic dissection, costochondritis, musculoskeletal chest wall pain, GERD.  Independent historian: Relative: Daughter and husband at bedside  External data reviewed: Notes: Reviewed patient's medications, typically takes 40 p.o. Lasix  daily  Labs: Ordered, Independent interpretation, and Details: No leukocytosis, mildly increased hemoglobin at 15.7, similar comparison to prior labs.  No thrombocytopenia.  D-dimer elevated at 7.01, patient has previously had elevated D-dimer, however not to this degree.  Initial troponin 51 (baseline troponin on compression fracture appears to be  60s).  Delta troponin46.  BNP elevated at 525, greater than baseline which appears to be 100 - 300.  CMP with significant hypokalemia to 2.3, elevation of creatinine to 1.75 and BUN to 45 appear to be at baseline per patient.  No emergent LFT abnormalities.  Magnesium  pending at the time of admission  Radiology: Ordered, Independent interpretation, Details: Chest x-ray without focal airspace opacification, cardiomediastinal floor tension,  pneumothorax, pleural effusion, bony derangement., and All images reviewed independently.  Agree with radiology report at this time.   DG Chest 2 View Result Date: 07/04/2024 CLINICAL DATA:  Mid chest pain. EXAM: CHEST - 2 VIEW COMPARISON:  04/25/2024 and CT chest 05/19/2023. FINDINGS: Trachea is midline. Heart is enlarged. Coarsened pulmonary markings, unchanged. No superimposed airspace consolidation or pleural fluid. IMPRESSION: 1. No acute findings. 2. Interstitial lung disease, better evaluated on CT chest 05/19/2023. Electronically Signed   By: Newell Eke M.D.   On: 07/04/2024 10:54    EKG/Medicine tests: Ordered and Independent interpretation EKG Interpretation Date/Time:  Wednesday July 04 2024 09:55:33 EDT Ventricular Rate:  85 PR Interval:    QRS Duration:  146 QT Interval:  431 QTC Calculation: 513 R Axis:   -62  Text Interpretation: Atrial fibrillation RBBB and LAFB Nonspecific T wave inversions in lead V1 through V4 which have been previously demonstrated  On comparison to prior EKG 27-feb-25, no significant change Confirmed by Rogelia Satterfield ([id number]) on 07/04/2024 10:25:15 AM               Interventions: Lasix , aspirin , prednisone , Tylenol , potassium repletion, magnesium  repletion  See the EMR for full details regarding lab and imaging results.  Aspirin  has been given.  The ECG reveals no anatomical ischemia representing STEMI, New-Onset Arrhythmia, or ischemic equivalent. She has been risk stratified with a HEAR score of 7. Initial  troponin is 51; delta troponin is 46.  The patient's presentation, the patient being hemodynamically stable, and the ECG are not consistent with Pericardial Tamponade. The patient's pain is not positional. This in conjunction with the lack of PR depressions and ST elevations on the ECG are reassuring against Pericarditis. The patient's troponin being at her baseline and ECG are also inconsistent with Myocarditis.  The CXR is unremarkable for focal airspace disease.  The patient is afebrile and denies productive cough.  Therefore, I do not suspect Pneumonia. There is no evidence of Pneumothorax on physical exam or on the CXR. CXR shows no evidence of Esophageal Tear and there is no recent intractable emesis or esophageal instrumentation. There is no peritonitis or free air on CXR worrisome for a Perforated Abdominal Viscous.  The patient's pain is not tearing and it does not radiate to back. Pulses are present bilaterally in both the upper and lower extremities. CXR does not show a widened mediastinum. I have a very low suspicion for Aortic Dissection.  I considered pulmonary embolism, patient does have elevation of her D-dimer, however patient with poor baseline kidney function, and given evidence of volume overload on exam including bilateral edema of the feet, rales, elevation of BNP beyond baseline, feel that symptoms are more consistent with heart failure exacerbation rather than pulmonary embolism.  Given patient's poor baseline kidney function, feel that contrast load would provide more harm than benefit in the setting of patient's presentation being more consistent with heart failure exacerbation and pulmonary embolism.  In addition to patient's hypovolemia, patient has significant hypokalemia demonstrated on labs.  Potentially related to patient's increased usage of Lasix .  Patient repleted with IV as well as p.o. potassium, additionally magnesium  administered and magnesium  level ordered in case  patient has hypomagnesemia compounding her hypokalemia.  Overall given combination of increased volume status not responsive to increasing her medications at home as well as electrolyte derangements, patient warrants hospitalization.  Medicine consulted for admission and accepted to hospitalist service.  Presentation is most consistent with acute complicated illness and Current presentation is  complicated by underlying chronic conditions  Discussion of management or test interpretations with external provider(s): Spoke with hospitalist medicine, Dr.Ikramullah  Risk Drugs:OTC drugs and Prescription drug management Treatment: Decision regarding hospitalization  Disposition: ADMIT: I believe the patient requires admission for further care and management. The patient was admitted to hospitalist. Please see inpatient provider note for additional treatment plan details.   MDM generated using voice dictation software and may contain dictation errors.  Please contact me for any clarification or with any questions.  Clinical Impression:  1. Acute on chronic heart failure with preserved ejection fraction (HFpEF) (HCC)   2. Hypokalemia      Admit   Final Clinical Impression(s) / ED Diagnoses Final diagnoses:  Acute on chronic heart failure with preserved ejection fraction (HFpEF) (HCC)  Hypokalemia    Rx / DC Orders ED Discharge Orders     None        Rogelia Jerilynn RAMAN, MD 07/04/24 1558

## 2024-07-05 DIAGNOSIS — I5031 Acute diastolic (congestive) heart failure: Secondary | ICD-10-CM | POA: Diagnosis not present

## 2024-07-05 LAB — BASIC METABOLIC PANEL WITH GFR
Anion gap: 23 — ABNORMAL HIGH (ref 5–15)
BUN: 47 mg/dL — ABNORMAL HIGH (ref 8–23)
CO2: 27 mmol/L (ref 22–32)
Calcium: 10.8 mg/dL — ABNORMAL HIGH (ref 8.9–10.3)
Chloride: 87 mmol/L — ABNORMAL LOW (ref 98–111)
Creatinine, Ser: 2.03 mg/dL — ABNORMAL HIGH (ref 0.44–1.00)
GFR, Estimated: 27 mL/min — ABNORMAL LOW (ref >60)
Glucose, Bld: 280 mg/dL — ABNORMAL HIGH (ref 70–99)
Potassium: 2.7 mmol/L — CL (ref 3.5–5.1)
Sodium: 136 mmol/L (ref 135–145)

## 2024-07-05 LAB — CBC
HCT: 44.4 % (ref 36.0–46.0)
Hemoglobin: 15 g/dL (ref 12.0–15.0)
MCH: 30.7 pg (ref 26.0–34.0)
MCHC: 33.8 g/dL (ref 30.0–36.0)
MCV: 90.8 fL (ref 80.0–100.0)
Platelets: 254 K/uL (ref 150–400)
RBC: 4.89 MIL/uL (ref 3.87–5.11)
RDW: 13.8 % (ref 11.5–15.5)
WBC: 5.7 K/uL (ref 4.0–10.5)
nRBC: 0 % (ref 0.0–0.2)

## 2024-07-05 LAB — GLUCOSE, CAPILLARY
Glucose-Capillary: 251 mg/dL — ABNORMAL HIGH (ref 70–99)
Glucose-Capillary: 288 mg/dL — ABNORMAL HIGH (ref 70–99)
Glucose-Capillary: 276 mg/dL — ABNORMAL HIGH (ref 70–99)
Glucose-Capillary: 208 mg/dL — ABNORMAL HIGH (ref 70–99)

## 2024-07-05 MED ORDER — INSULIN ASPART 100 UNIT/ML IJ SOLN
3.0000 [IU] | Freq: Three times a day (TID) | INTRAMUSCULAR | Status: DC
  Administered 2024-07-05 – 2024-07-06 (×4): 3 [IU] via SUBCUTANEOUS

## 2024-07-05 MED ORDER — POTASSIUM CHLORIDE CRYS ER 20 MEQ PO TBCR
40.0000 meq | EXTENDED_RELEASE_TABLET | ORAL | Status: AC
  Administered 2024-07-05 (×3): 40 meq via ORAL
  Filled 2024-07-05 (×2): qty 2

## 2024-07-05 MED ORDER — POTASSIUM CHLORIDE CRYS ER 20 MEQ PO TBCR: 20.0000 meq | EXTENDED_RELEASE_TABLET | Freq: Every day | ORAL | Status: DC

## 2024-07-05 MED ORDER — METOPROLOL TARTRATE 25 MG PO TABS
25.0000 mg | ORAL_TABLET | Freq: Two times a day (BID) | ORAL | Status: DC
  Administered 2024-07-05 – 2024-07-06 (×2): 25 mg via ORAL
  Filled 2024-07-05 (×2): qty 1

## 2024-07-05 MED ORDER — INSULIN GLARGINE 100 UNIT/ML ~~LOC~~ SOLN
20.0000 [IU] | Freq: Every day | SUBCUTANEOUS | Status: DC
  Administered 2024-07-05 – 2024-07-06 (×2): 20 [IU] via SUBCUTANEOUS
  Filled 2024-07-05 (×2): qty 0.2

## 2024-07-05 NOTE — Inpatient Diabetes Management (Signed)
 Inpatient Diabetes Program Recommendations  AACE/ADA: New Consensus Statement on Inpatient Glycemic Control (2015)  Target Ranges:  Prepandial:   less than 140 mg/dL      Peak postprandial:   less than 180 mg/dL (1-2 hours)      Critically ill patients:  140 - 180 mg/dL   Lab Results  Component Value Date   GLUCAP 251 (H) 07/05/2024   HGBA1C 9.0 (H) 01/06/2024    Review of Glycemic Control  Latest Reference Range & Units 07/05/24 07:16  Glucose-Capillary 70 - 99 mg/dL 748 (H)  (H): Data is abnormally high Diabetes history: Type 2 DM Outpatient Diabetes medications: Novolog  70/30 20 units Q/30 units QP, Metformin  1000 mg BID, Jardiance 10 mg QD Current orders for Inpatient glycemic control: Novolog  0-15 units TID & HS Prednisone  5 mg QD  Inpatient Diabetes Program Recommendations:    Consider: -Repeating A1C -Adding Lantus  20 units every day -Adding Novolog  3 units TID (assuming patient is consuming >50% of meals)  Thanks, Tinnie Minus, MSN, RNC-OB Diabetes Coordinator [phone] (8a-5p)

## 2024-07-05 NOTE — Progress Notes (Signed)
 PROGRESS NOTE  Kathleen Kane  FMW:[id number] DOB: 20-May-1961 DOA: 07/04/2024 PCP: [name], MD   Brief Narrative: Patient is a 63 year old female with history of permanent A-fib on metoprolol , Eliquis , HFpEF, type 2 diabetes, CKD stage IIIb, interstitial lung disease hypertension who presented with complaint of chest discomfort, mild orthopnea, lower extremity edema..  She takes Lasix  at home but had missed few doses.  No report of increased cough or shortness of breath while at rest but complained of orthopnea.  Appeared volume overloaded on presentation.  Lab work showed elevated BNP.  Patient  admitted for further management of acute on chronic HFpEF.  Started on IV Lasix .    Assessment & Plan:  Principal Problem:   Acute diastolic (congestive) heart failure (HCC)  Acute on chronic HFpEF: Presented with lower extremity edema, and orthopnea.  Elevated BNP. takes Lasix  at home but missed few doses.  .  Last echo on June 2023 showed EF of 60%.  Given IV Lasix . This morning, she appears overall euvolemic.  She is on room air.  No significant lower extremity edema observed. Her creatinine trended up so Lasix  is on hold  CKD stage IIIb: Her baseline creatinine fluctuates from 1.6-2.  Currently her kidney function close to her baseline or slightly on the higher level.  Continue to monitor , Lasix  held.  She is to follow-up with nephrology as an outpatient.  Permanent A-fib: Monitor on telemetry.  On rate control with metoprolol .  On Eliquis  for anticoagulation.  Increased the dose of metoprolol .  Remains in A-fib rhythm.  She needs to follow-up with cardiology as an outpatient  Severe hypokalemia: Magnesium  level optimal.  Continue to monitor and supplement potassium.  Chest discomfort: Troponins are flat, no acute EKG changes.  Denies any chest pain during my evaluation this morning.  Type 2 diabetes: On modified carb diet.  Continue current insulin  regimen.  Monitor blood sugars.  A1c  level  Rheumatoid arthritis: Takes prednisone  5 mg daily at home  Hyperlipidemia: On Lipitor  History of ILD: Has crackles on the right base.  This is likely from the history of interstitial lung disease.  She follows with Dr. Geronimo  Weakness: Will consult PT         DVT prophylaxis: apixaban  (ELIQUIS ) tablet 5 mg     Code Status: Full Code  Family Communication: Family at bedside  Patient status:Inpatient  Patient is from :Home  Anticipated discharge un:Ynfz  Estimated DC date:tomorrow   Consultants: None  Procedures:None  Antimicrobials:  Anti-infectives (From admission, onward)    Start     Dose/Rate Route Frequency Ordered Stop   07/04/24 1500  sulfamethoxazole -trimethoprim  (BACTRIM  DS) 800-160 MG per tablet 1 tablet        1 tablet Oral Once per day on Monday Wednesday Friday 07/04/24 1445         Subjective: Patient seen and examined at the bedside today.  On room air.  Denies any shortness of breath or cough.  Sitting on the bed.  Looks comfortable.  Speaking in full sentences.  Eager to go home.  Family at bedside.  We discussed about monitoring her another night to check her electrolytes and kidney function tomorrow morning.  We also requested PT assessment  Objective: Vitals:   07/04/24 2120 07/05/24 0214 07/05/24 0406 07/05/24 0623  BP: (!) 154/74 (!) 83/67 134/69 (!) 140/66  Pulse: 98 82 79 (!) 105  Resp: 18 18  18   Temp: 98.2 F (36.8 C) 98.6 F (37 C)  98.2 F (36.8 C)  TempSrc: Oral Oral    SpO2: 96% 95% 98% 92%    Intake/Output Summary (Last 24 hours) at 07/05/2024 0747 Last data filed at 07/04/2024 2128 Gross per 24 hour  Intake 220 ml  Output --  Net 220 ml   There were no vitals filed for this visit.  Examination:  General exam: Overall comfortable, not in distress, obese HEENT: PERRL Respiratory system: Crackles on right base Cardiovascular system: Irregularly irregular rhythm Gastrointestinal system: Abdomen is  nondistended, soft and nontender. Central nervous system: Alert and oriented Extremities: No edema, no clubbing ,no cyanosis Skin: No rashes, no ulcers,no icterus     Data Reviewed: I have personally reviewed following labs and imaging studies  CBC: Recent Labs  Lab 07/04/24 1025 07/05/24 0516  WBC 4.4 5.7  HGB 15.7* 15.0  HCT 47.6* 44.4  MCV 89.8 90.8  PLT 262 254   Basic Metabolic Panel: Recent Labs  Lab 07/04/24 1243 07/04/24 1423 07/05/24 0516  NA 140  --  136  K 2.3*  --  2.7*  CL 89*  --  87*  CO2 29  --  27  GLUCOSE 281*  --  280*  BUN 45*  --  47*  CREATININE 1.75*  --  2.03*  CALCIUM  10.9*  --  10.8*  MG  --  2.3  --      No results found for this or any previous visit (from the past 240 hours).   Radiology Studies: DG Chest 2 View Result Date: 07/04/2024 CLINICAL DATA:  Mid chest pain. EXAM: CHEST - 2 VIEW COMPARISON:  04/25/2024 and CT chest 05/19/2023. FINDINGS: Trachea is midline. Heart is enlarged. Coarsened pulmonary markings, unchanged. No superimposed airspace consolidation or pleural fluid. IMPRESSION: 1. No acute findings. 2. Interstitial lung disease, better evaluated on CT chest 05/19/2023. Electronically Signed   By: Newell Eke M.D.   On: 07/04/2024 10:54    Scheduled Meds:  apixaban   5 mg Oral BID   atorvastatin   20 mg Oral Daily   ferrous sulfate   325 mg Oral Q breakfast   furosemide   40 mg Intravenous BID   insulin  aspart  0-15 Units Subcutaneous TID WC   insulin  aspart  0-5 Units Subcutaneous QHS   metoprolol  tartrate  12.5 mg Oral BID   predniSONE   5 mg Oral Q breakfast   sulfamethoxazole -trimethoprim   1 tablet Oral Once per day on Monday Wednesday Friday   Continuous Infusions:   LOS: 1 day   Ivonne Mustache, MD [facility] P8/28/2025, 7:47 AM

## 2024-07-05 NOTE — Progress Notes (Signed)
 Orthopedic Tech Progress Note Patient Details:  Kathleen Kane 1961-10-19 [id number]  Ortho Devices Type of Ortho Device: Knee Sleeve Ortho Device/Splint Location: right Ortho Device/Splint Interventions: Ordered, Application, Adjustment   Post Interventions Patient Tolerated: Well Instructions Provided: Adjustment of device, Care of device  Waylan Thom Loving 07/05/2024, 4:15 PM

## 2024-07-05 NOTE — Evaluation (Signed)
 Physical Therapy Evaluation Patient Details Name: Kathleen Kane MRN: [id number] DOB: Mar 19, 1961 Today's Date: 07/05/2024  History of Present Illness  63 y.o. female admitted to the hospital with complaint of chest discomfort, mild orthopnea, lower extremity edema.SABRASABRAPatient  admitted for further management of acute on chronic HFpEF. Past medical history significant for permanent A-fib, CHF, IPF, insulin -dependent type 2 diabetes, OSA , RA  Clinical Impression  Pt admitted with above diagnosis.  Pt currently with functional limitations due to the deficits listed below (see PT Problem List). Pt will benefit from acute skilled PT to increase their independence and safety with mobility to allow discharge.     Patient reports that [facility] can [facility] and walk more.  Patient ambulated x 30' using a rollator.   Patient uses rollator at home, does have some assistance  for ADL'd. \Placed order for right knee  slleve for jpoint support.  Will check back on pt. Tomorrow to see if the sleeve is beneficial/       If plan is discharge home, recommend the following: A little help with bathing/dressing/bathroom;Assistance with cooking/housework;Help with stairs or ramp for entrance;Assist for transportation   Can travel by private vehicle        Equipment Recommendations None recommended by PT  Recommendations for Other Services       Functional Status Assessment Patient has had a recent decline in their functional status and demonstrates the ability to make significant improvements in function in a reasonable and predictable amount of time.     Precautions / Restrictions Precautions Precautions: Fall Restrictions Weight Bearing Restrictions Per Provider Order: No      Mobility  Bed Mobility Overal bed mobility: Needs Assistance             General bed mobility comments: seated on bed    Transfers Overall transfer level: Needs  assistance Equipment used: Rollator (4 wheels) Transfers: Sit to/from Stand                  Ambulation/Gait Ambulation/Gait assistance: Modified independent (Device/Increase time) Gait Distance (Feet): 30 Feet (x 2) Assistive device: Rollator (4 wheels) Gait Pattern/deviations: Step-to pattern, Step-through pattern Gait velocity: slow        Stairs            Wheelchair Mobility     Tilt Bed    Modified Rankin (Stroke Patients Only)       Balance Overall balance assessment: Mild deficits observed, not formally tested                                           Pertinent Vitals/Pain Pain Assessment Pain Assessment: Faces Faces Pain Scale: Hurts little more Pain Location: knees, hands. loer legs Pain Descriptors / Indicators: Discomfort Pain Intervention(s): Monitored during session    Home Living Family/patient expects to be discharged to:: Private residence Living Arrangements: Spouse/significant other;Children   Type of Home: House Home Access: Stairs to enter Entrance Stairs-Rails: None Entrance Stairs-Number of Steps: 3     Home Equipment: Rollator (4 wheels);Rolling Walker (2 wheels);Cane - single point;Shower seat      Prior Function Prior Level of Function : Needs assist             Mobility Comments: uses rollator or shopping buggy when out of house ADLs Comments: needs assist for  dressing due to RA     Extremity/Trunk Assessment   Upper Extremity Assessment Upper Extremity Assessment: Overall WFL for tasks assessed (noted joint deformities from RA)    Lower Extremity Assessment Lower Extremity Assessment: Generalized weakness    Cervical / Trunk Assessment Cervical / Trunk Assessment: Normal  Communication   Communication Communication: No apparent difficulties    Cognition Arousal: Alert Behavior During Therapy: WFL for tasks assessed/performed   PT - Cognitive impairments: No apparent  impairments                         Following commands: Intact       Cueing       General Comments      Exercises     Assessment/Plan    PT Assessment Patient needs continued PT services  PT Problem List Decreased activity tolerance;Pain;Decreased mobility       PT Treatment Interventions DME instruction;Functional mobility training;Therapeutic activities    PT Goals (Current goals can be found in the Care Plan section)  Acute Rehab PT Goals Patient Stated Goal: go home, exercise PT Goal Formulation: With patient Time For Goal Achievement: 07/19/24 Potential to Achieve Goals: Good    Frequency Min 3X/week     Co-evaluation               AM-PAC PT 6 Clicks Mobility  Outcome Measure Help needed turning from your back to your side while in a flat bed without using bedrails?: None Help needed moving from lying on your back to sitting on the side of a flat bed without using bedrails?: None Help needed moving to and from a bed to a chair (including a wheelchair)?: A Little Help needed standing up from a chair using your arms (e.g., wheelchair or bedside chair)?: A Little Help needed to walk in hospital room?: A Little Help needed climbing 3-5 steps with a railing? : A Lot 6 Click Score: 19    End of Session   Activity Tolerance: Patient tolerated treatment well Patient left: in chair;with call bell/phone within reach Nurse Communication: Mobility status PT Visit Diagnosis: Muscle weakness (generalized) (M62.81);Pain Pain - Right/Left: Right Pain - part of body: Knee    Time: 8484-8445 PT Time Calculation (min) (ACUTE ONLY): 39 min   Charges:   PT Evaluation $PT Eval Low Complexity: 1 Low PT Treatments $Gait Training: 23-37 mins PT General Charges $$ ACUTE PT VISIT: 1 Visit         Darice Potters PT Acute Rehabilitation Services Office [phone]   Potters Darice Norris 07/05/2024, 4:10 PM

## 2024-07-05 NOTE — Progress Notes (Signed)
   07/05/24 1145  Spiritual Encounters  Type of Visit Initial  Care provided to: Pt and family  Reason for visit Advance directives  OnCall Visit No    Chaplain responded to AD spiritual consult. Paperwork and information given to pt Ragan, her husband, and daughter. They plan to discuss their wishes thoroughly before completing the documents.  Daughter asked what kinds of spiritual services we provide, and I explained that we are there to talk with patients and their family, help them to process what's going on, pray with them--whatever they need. Zyon indicated that she would consider having us  come by to pray over her, but they need to think about/reflect on that before deciding to reach back out.  Chaplains remain available whenever needed.

## 2024-07-05 NOTE — TOC Initial Note (Signed)
 Transition of Care [facility]) - Initial/Assessment Note    Patient Details  Name: Kathleen Kane MRN: [id number] Date of Birth: Dec 26, 1960  Transition of Care [facility]) CM/SW Contact:    Bascom Service, RN Phone Number: 07/05/2024, 2:22 PM  Clinical Narrative: d/c plan home.                  Expected Discharge Plan: Home/Self Care Barriers to Discharge: Continued Medical Work up   Patient Goals and CMS Choice Patient states their goals for this hospitalization and ongoing recovery are:: Home CMS Medicare.gov Compare Post Acute Care list provided to:: Patient Choice offered to / list presented to : Patient [place] ownership interest in [facility].provided to:: Patient    Expected Discharge Plan and Services                                              Prior Living Arrangements/Services                       Activities of Daily Living   ADL Screening (condition at time of admission) Independently performs ADLs?: No Does the patient have a NEW difficulty with bathing/dressing/toileting/self-feeding that is expected to last >3 days?: Yes (Initiates electronic notice to provider for possible OT consult) Does the patient have a NEW difficulty with getting in/out of bed, walking, or climbing stairs that is expected to last >3 days?: Yes (Initiates electronic notice to provider for possible PT consult) Does the patient have a NEW difficulty with communication that is expected to last >3 days?: Yes (Initiates electronic notice to provider for possible SLP consult) Is the patient deaf or have difficulty hearing?: No Does the patient have difficulty seeing, even when wearing glasses/contacts?: No Does the patient have difficulty concentrating, remembering, or making decisions?: No  Permission Sought/Granted                  Emotional Assessment              Admission diagnosis:  Hypokalemia [E87.6] Acute diastolic (congestive) heart failure  (HCC) [I50.31] Acute on chronic heart failure with preserved ejection fraction (HFpEF) (HCC) [I50.33] Patient Active Problem List   Diagnosis Date Noted   Acute diastolic (congestive) heart failure (HCC) 07/04/2024   Hyponatremia 01/05/2024   ILD (interstitial lung disease) (HCC) 04/21/2022   Nocturnal hypoxemia 04/21/2022   Acute pulmonary edema (HCC) 03/12/2022   Acute respiratory failure with hypoxia (HCC) 03/11/2022   Colon cancer screening 01/26/2022   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   Dysphagia 01/26/2022   History of colonic polyps 01/26/2022   Aortic atherosclerosis (HCC) 05/13/2021   Acute otalgia, right 11/26/2020   Arthralgia of right temporomandibular joint 11/26/2020   Bilateral impacted cerumen 11/26/2020   HCAP (healthcare-associated pneumonia) 10/02/2018   Chronic congestive heart failure (HCC) 10/01/2018   OSA (obstructive sleep apnea) 02/10/2017   Carpal tunnel syndrome, right 12/15/2016   Extensor tenosynovitis of wrist, right 12/15/2016   Bilateral primary osteoarthritis of knee 10/08/2015   Rheumatoid arthritis, 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   Acute on chronic diastolic CHF (congestive heart failure) (HCC) 08/08/2015   Morbid obesity (HCC) 05/06/2015   Essential hypertension 04/10/2015   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   Primary osteoarthritis involving multiple joints 04/09/2015   High risk medications (not anticoagulants) long-term use 02/04/2015  Elevated uric acid in blood 01/23/2014   High risk medication use 01/23/2014   Numbness and tingling in left hand 10/01/2013   Tobacco abuse 10/01/2013   Elevated CA-125 02/06/2013   Pelvic mass in female 01/29/2013   Diabetes mellitus (HCC) 08/10/2012   Atrial fibrillation (HCC)    Chronic diastolic CHF (congestive heart failure) (HCC)    PCP:  [name], MD Pharmacy:   [id number] - [place], [place] - [address] AT [facility]  [address] [place] [address] Phone: [phone] Fax: [phone]  [facility] - [facility] [address] [place] [address] Phone: [phone] Fax: [phone]     Social Drivers of Health (SDOH) Social History: SDOH Screenings   Food Insecurity: No Food Insecurity (07/04/2024)  Housing: Low Risk  (07/04/2024)  Transportation Needs: No Transportation Needs (07/04/2024)  Utilities: Not At Risk (07/04/2024)  Social Connections: Moderately Isolated (07/04/2024)  Tobacco Use: Medium Risk (07/04/2024)   SDOH Interventions:     Readmission Risk Interventions    01/06/2024   11:12 AM  Readmission Risk Prevention Plan  Transportation Screening Complete  PCP or Specialist Appt within 3-5 Days Complete  HRI or Home Care Consult Complete  Social Work Consult for Recovery Care Planning/Counseling Complete  Palliative Care Screening Not Applicable  Medication Review Oceanographer) Complete

## 2024-07-06 ENCOUNTER — Other Ambulatory Visit (HOSPITAL_COMMUNITY): Payer: Self-pay

## 2024-07-06 DIAGNOSIS — I5031 Acute diastolic (congestive) heart failure: Secondary | ICD-10-CM | POA: Diagnosis not present

## 2024-07-06 LAB — GLUCOSE, CAPILLARY
Glucose-Capillary: 230 mg/dL — ABNORMAL HIGH (ref 70–99)
Glucose-Capillary: 230 mg/dL — ABNORMAL HIGH (ref 70–99)

## 2024-07-06 LAB — POTASSIUM: Potassium: 3.4 mmol/L — ABNORMAL LOW (ref 3.5–5.1)

## 2024-07-06 LAB — BASIC METABOLIC PANEL WITH GFR
Anion gap: 18 — ABNORMAL HIGH (ref 5–15)
BUN: 48 mg/dL — ABNORMAL HIGH (ref 8–23)
CO2: 27 mmol/L (ref 22–32)
Calcium: 10.4 mg/dL — ABNORMAL HIGH (ref 8.9–10.3)
Chloride: 94 mmol/L — ABNORMAL LOW (ref 98–111)
Creatinine, Ser: 1.96 mg/dL — ABNORMAL HIGH (ref 0.44–1.00)
GFR, Estimated: 28 mL/min — ABNORMAL LOW (ref >60)
Glucose, Bld: 220 mg/dL — ABNORMAL HIGH (ref 70–99)
Potassium: 2.9 mmol/L — ABNORMAL LOW (ref 3.5–5.1)
Sodium: 139 mmol/L (ref 135–145)

## 2024-07-06 LAB — HEMOGLOBIN A1C
Hgb A1c MFr Bld: 10.6 % — ABNORMAL HIGH (ref 4.8–5.6)
Mean Plasma Glucose: 258 mg/dL

## 2024-07-06 MED ORDER — SENNA 8.6 MG PO TABS
1.0000 | ORAL_TABLET | Freq: Every day | ORAL | 0 refills | Status: DC
  Filled 2024-07-06: qty 14, 14d supply, fill #0

## 2024-07-06 MED ORDER — POTASSIUM CHLORIDE CRYS ER 20 MEQ PO TBCR
40.0000 meq | EXTENDED_RELEASE_TABLET | Freq: Every day | ORAL | 0 refills | Status: AC
  Filled 2024-07-06: qty 60, 30d supply, fill #0

## 2024-07-06 MED ORDER — POTASSIUM CHLORIDE CRYS ER 20 MEQ PO TBCR: 40.0000 meq | EXTENDED_RELEASE_TABLET | Freq: Once | ORAL | Status: DC

## 2024-07-06 MED ORDER — POTASSIUM CHLORIDE CRYS ER 20 MEQ PO TBCR
40.0000 meq | EXTENDED_RELEASE_TABLET | ORAL | Status: AC
  Administered 2024-07-06 (×2): 40 meq via ORAL
  Filled 2024-07-06 (×2): qty 2

## 2024-07-06 MED ORDER — POTASSIUM CHLORIDE CRYS ER 20 MEQ PO TBCR
20.0000 meq | EXTENDED_RELEASE_TABLET | Freq: Every day | ORAL | 0 refills | Status: DC
Start: 2024-07-06 — End: 2024-07-06
  Filled 2024-07-06: qty 60, 60d supply, fill #0

## 2024-07-06 NOTE — TOC Transition Note (Signed)
 Transition of Care [facility]) - Discharge Note   Patient Details  Name: Kathleen Kane MRN: [id number] Date of Birth: [birth year]  Transition of Care [facility]) CM/SW Contact:  Bascom Service, RN Phone Number: 07/06/2024, 11:05 AM   Clinical Narrative:   d/c home no CM needs.    Final next level of care: Home/Self Care Barriers to Discharge: No Barriers Identified   Patient Goals and CMS Choice Patient states their goals for this hospitalization and ongoing recovery are:: home CMS Medicare.gov Compare Post Acute Care list provided to:: Patient Choice offered to / list presented to : Patient [place] ownership interest in [facility].provided to:: Patient    Discharge Placement                       Discharge Plan and Services Additional resources added to the After Visit Summary for                                       Social Drivers of Health (SDOH) Interventions SDOH Screenings   Food Insecurity: No Food Insecurity (07/04/2024)  Housing: Low Risk  (07/04/2024)  Transportation Needs: No Transportation Needs (07/04/2024)  Utilities: Not At Risk (07/04/2024)  Social Connections: Moderately Isolated (07/04/2024)  Tobacco Use: Medium Risk (07/04/2024)     Readmission Risk Interventions    01/06/2024   11:12 AM  Readmission Risk Prevention Plan  Transportation Screening Complete  PCP or Specialist Appt within 3-5 Days Complete  HRI or Home Care Consult Complete  Social Work Consult for Recovery Care Planning/Counseling Complete  Palliative Care Screening Not Applicable  Medication Review Oceanographer) Complete

## 2024-07-06 NOTE — Progress Notes (Signed)
 Orthopedic Tech Progress Note Patient Details:  Kathleen Kane [birth year] [id number]  Ortho Devices Type of Ortho Device: Knee Sleeve Ortho Device/Splint Location: RLE Ortho Device/Splint Interventions: Application   Post Interventions Patient Tolerated: Well Instructions Provided: Adjustment of device, Care of device  [name] [name] 07/06/2024, 12:04 PM

## 2024-07-06 NOTE — Progress Notes (Signed)
Discharge medications delivered to patient at bedside.

## 2024-07-06 NOTE — Discharge Summary (Addendum)
 Physician Discharge Summary  Kathleen Kane FMW:[id number] DOB: 02/08/1961 DOA: 07/04/2024  PCP: Kathleen Atlas, MD  Admit date: 07/04/2024 Discharge date: 07/06/2024  Admitted From: Home Disposition:  Home  Discharge Condition:Stable CODE STATUS:FULL Diet recommendation:  Carb Modified  Brief/Interim Summary: Patient is a 63 year old female with history of permanent A-fib on metoprolol , Eliquis , HFpEF, type 2 diabetes, CKD stage IIIb, interstitial lung disease hypertension who presented with complaint of chest discomfort, mild orthopnea, lower extremity edema..  She takes Lasix  at home but had missed few doses.  No report of increased cough or shortness of breath while at rest but complained of orthopnea.  Appeared volume overloaded on presentation.  Lab work showed elevated BNP.  Patient  admitted for further management of acute on chronic HFpEF.  Started on IV Lasix .   Appears euvolemic this morning.  On room air.  No significant peripheral edema.  Can continue Lasix  40 mg daily at home.  She needs to follow-up with her cardiologist outpatient.  She has been instructed about the salt and fluid restriction.  Medically stable for discharge home today.  Following problems were addressed during the hospitalization:  Acute on chronic HFpEF: Presented with lower extremity edema, and orthopnea.  Elevated BNP. takes Lasix  at home but missed few doses.   Last echo on June 2023 showed EF of 60%.  Given IV Lasix . This morning, she appears overall euvolemic.  She is on room air.  No significant lower extremity edema observed. She will resume her Lasix  40 mg daily at home.   CKD stage IIIb: Her baseline creatinine fluctuates from 1.6-2.  Currently her kidney function close to her baseline . She needs to follow-up with nephrology as an outpatient.  Ambulatory referral provided.  She needs to follow-up with her PCP and do a BMP test to check her kidney function   Permanent A-fib: Monitor on telemetry.  On  rate control with metoprolol .  On Eliquis  for anticoagulation.    Remains in A-fib rhythm.  She needs to follow-up with cardiology as an outpatient   Severe hypokalemia: Magnesium  level optimal.  Aggressively supplemented.  Continue potassium supplementation on discharge  Chest discomfort: Troponins are flat, no acute EKG changes.  Denies any chest pain during my evaluation this morning.   Type 2 diabetes: On modified carb diet.  Continue current insulin  regimen.    A1c level in the range of 10.  She was taking low-dose of insulin  at home.  She is to continue 40- units of 70/30 insulin  twice daily.  I have instructed her to monitor her blood sugars at home   Rheumatoid arthritis: Takes prednisone  10 mg daily at home   Hyperlipidemia: On Lipitor   History of ILD: Has crackles on the right base.  This is likely from the history of interstitial lung disease.  She follows with Dr. Geronimo   Weakness: PT consulted.  No follow-up recommended         Discharge Diagnoses:  Principal Problem:   Acute diastolic (congestive) heart failure [facility])    Discharge Instructions  Discharge Instructions     Ambulatory referral to Nephrology   Complete by: As directed    Diet - low sodium heart healthy   Complete by: As directed    Discharge instructions   Complete by: As directed    1)Please take your medications as instructed 2)Follow up with your PCP next week and do a BMP test to check your potassium level and kidney function 3)Monitor your blood sugars at  home.  Continue insulin  as instructed 4)Follow up with your cardiologist as an outpatient. 5) Follow-up with nephrology as an outpatient.  We have provided a referral 6)Watch your fluid and salt intake at home.   Increase activity slowly   Complete by: As directed       Allergies as of 07/06/2024       Reactions   Ace Inhibitors Cough   Lisinopril  Cough        Medication List     STOP taking these medications    losartan   25 MG tablet Commonly known as: COZAAR        TAKE these medications    atorvastatin  20 MG tablet Commonly known as: LIPITOR TAKE 1 TABLET (20 MG TOTAL) BY MOUTH DAILY. PLEASE SCHEDULE YEARLY APPOINTMENT FOR FUTURE REFILLS. 1ST ATTEMPT. THANK YOU   B-D ULTRAFINE III SHORT PEN 31G X 8 MM Misc Generic drug: Insulin  Pen Needle Inject into the skin 2 (two) times daily.   benzonatate  100 MG capsule Commonly known as: TESSALON  Take 1 capsule (100 mg total) by mouth every 8 (eight) hours.   cetirizine  10 MG tablet Commonly known as: ZYRTEC  Take 1 tablet (10 mg total) by mouth at bedtime. What changed:  when to take this reasons to take this   diazepam  5 MG tablet Commonly known as: VALIUM  Take 1 tablet (5 mg total) by mouth every 6 (six) hours as needed for muscle spasms.   Eliquis  5 MG Tabs tablet Generic drug: apixaban  TAKE 1 TABLET BY MOUTH TWICE A DAY   ferrous sulfate  325 (65 FE) MG tablet Take 1 tablet (325 mg total) by mouth daily with breakfast.   folic acid  1 MG tablet Commonly known as: FOLVITE  Take 1 mg by mouth every morning.   furosemide  40 MG tablet Commonly known as: LASIX  Take 1 tablet (40 mg total) by mouth daily. What changed: additional instructions   Jardiance 10 MG Tabs tablet Generic drug: empagliflozin Take 10 mg by mouth daily.   levalbuterol  45 MCG/ACT inhaler Commonly known as: Xopenex  HFA Inhale 1-2 puffs into the lungs every 6 (six) hours as needed for wheezing.   metFORMIN  500 MG tablet Commonly known as: GLUCOPHAGE  Take 2 tablets (1,000 mg total) by mouth 2 (two) times daily.   metolazone  5 MG tablet Commonly known as: ZAROXOLYN  TAKE 1 TABLET EVERY OTHER DAY. TAKE 30 MINUTES PRIOR TO TAKING FUROSEMIDE  ( LASIX  )   metoprolol  tartrate 25 MG tablet Commonly known as: LOPRESSOR  TAKE 1 TABLET BY MOUTH TWICE A DAY What changed: how much to take   NovoLOG  Mix 70/30 FlexPen (70-30) 100 UNIT/ML FlexPen Generic drug: insulin  aspart  protamine - aspart Inject 0.4 mLs (40 Units total) into the skin 2 (two) times daily with a meal. What changed:  how much to take additional instructions   potassium chloride  SA 20 MEQ tablet Commonly known as: KLOR-CON  M Take 2 tablets (40 mEq total) by mouth daily. What changed:  medication strength how much to take when to take this   predniSONE  10 MG tablet Commonly known as: DELTASONE  TAKE 1 TABLET BY MOUTH EVERY DAY   predniSONE  5 MG tablet Commonly known as: DELTASONE  Take 1 tablet (5 mg total) by mouth daily with breakfast.   senna 8.6 MG Tabs tablet Commonly known as: SENOKOT Take 1 tablet (8.6 mg total) by mouth daily.   sulfamethoxazole -trimethoprim  800-160 MG tablet Commonly known as: BACTRIM  DS Take 1 tablet by mouth 3 (three) times a week.   tiZANidine 4  MG tablet Commonly known as: ZANAFLEX Take 4 mg by mouth 2 (two) times daily as needed for muscle spasms.   Tyenne  162 MG/0.9ML Soaj Generic drug: Tocilizumab -aazg Inject 162 mg into the skin once a week.        Follow-up Information     Kathleen Atlas, MD. Schedule an appointment as soon as possible for a visit in 1 week(s).   Specialty: Internal Medicine Contact information: [address] [place] [address] [phone]                Allergies  Allergen Reactions   Ace Inhibitors Cough   Lisinopril  Cough    Consultations: None   Procedures/Studies: DG Chest 2 View Result Date: 07/04/2024 CLINICAL DATA:  Mid chest pain. EXAM: CHEST - 2 VIEW COMPARISON:  04/25/2024 and CT chest 05/19/2023. FINDINGS: Trachea is midline. Heart is enlarged. Coarsened pulmonary markings, unchanged. No superimposed airspace consolidation or pleural fluid. IMPRESSION: 1. No acute findings. 2. Interstitial lung disease, better evaluated on CT chest 05/19/2023. Electronically Signed   By: Newell Eke M.D.   On: 07/04/2024 10:54      Subjective: Patient seen and examined at bedside today.   Hemodynamically stable.  Very comfortable this morning.  Sitting in the chair.  No edema in the lower extremities.  On room air.  No cough or shortness of breath.  Very eager to go home.  Discharge planning discussed with the daughter at bedside  Discharge Exam: Vitals:   07/05/24 2134 07/06/24 0548  BP: (!) 147/84 133/66  Pulse: 91 82  Resp: 20 18  Temp: 98.6 F (37 C) 98.9 F (37.2 C)  SpO2: 100% 94%   Vitals:   07/05/24 1318 07/05/24 2134 07/06/24 0548 07/06/24 0824  BP: 119/73 (!) 147/84 133/66   Pulse: 80 91 82   Resp: 18 20 18    Temp: 98.4 F (36.9 C) 98.6 F (37 C) 98.9 F (37.2 C)   TempSrc: Oral Oral Oral   SpO2: 95% 100% 94%   Weight:   98.4 kg   Height:    5' 2.6 (1.59 m)    General: Pt is alert, awake, not in acute distress,obese Cardiovascular: RRR, S1/S2 +, no rubs, no gallops Respiratory: CTA bilaterally, no wheezing, no rhonchi Abdominal: Soft, NT, ND, bowel sounds + Extremities: no edema, no cyanosis    The results of significant diagnostics from this hospitalization (including imaging, microbiology, ancillary and laboratory) are listed below for reference.     Microbiology: No results found for this or any previous visit (from the past 240 hours).   Labs: BNP (last 3 results) Recent Labs    01/05/24 0845  BNP 74.6   Basic Metabolic Panel: Recent Labs  Lab 07/04/24 1243 07/04/24 1423 07/05/24 0516 07/06/24 0520  NA 140  --  136 139  K 2.3*  --  2.7* 2.9*  CL 89*  --  87* 94*  CO2 29  --  27 27  GLUCOSE 281*  --  280* 220*  BUN 45*  --  47* 48*  CREATININE 1.75*  --  2.03* 1.96*  CALCIUM  10.9*  --  10.8* 10.4*  MG  --  2.3  --   --    Liver Function Tests: Recent Labs  Lab 07/04/24 1243  AST 19  ALT 19  ALKPHOS 63  BILITOT 0.8  PROT 7.0  ALBUMIN 4.4   No results for input(s): LIPASE, AMYLASE in the last 168 hours. No results for input(s): AMMONIA in the  last 168 hours. CBC: Recent Labs  Lab 07/04/24 1025  07/05/24 0516  WBC 4.4 5.7  HGB 15.7* 15.0  HCT 47.6* 44.4  MCV 89.8 90.8  PLT 262 254   Cardiac Enzymes: No results for input(s): CKTOTAL, CKMB, CKMBINDEX, TROPONINI in the last 168 hours. BNP: Invalid input(s): POCBNP CBG: Recent Labs  Lab 07/05/24 0716 07/05/24 1128 07/05/24 1627 07/05/24 2149 07/06/24 0739  GLUCAP 251* 288* 276* 208* 230*   D-Dimer Recent Labs    07/04/24 1025  DDIMER 7.01*   Hgb A1c Recent Labs    07/05/24 0516  HGBA1C 10.6*   Lipid Profile No results for input(s): CHOL, HDL, LDLCALC, TRIG, CHOLHDL, LDLDIRECT in the last 72 hours. Thyroid function studies No results for input(s): TSH, T4TOTAL, T3FREE, THYROIDAB in the last 72 hours.  Invalid input(s): FREET3 Anemia work up No results for input(s): VITAMINB12, FOLATE, FERRITIN, TIBC, IRON, RETICCTPCT in the last 72 hours. Urinalysis    Component Value Date/Time   COLORURINE STRAW (A) 06/25/2023 1218   APPEARANCEUR CLEAR 06/25/2023 1218   LABSPEC 1.006 06/25/2023 1218   PHURINE 5.0 06/25/2023 1218   GLUCOSEU NEGATIVE 06/25/2023 1218   HGBUR SMALL (A) 06/25/2023 1218   BILIRUBINUR NEGATIVE 06/25/2023 1218   KETONESUR NEGATIVE 06/25/2023 1218   PROTEINUR NEGATIVE 06/25/2023 1218   UROBILINOGEN 1.0 07/22/2012 1700   NITRITE NEGATIVE 06/25/2023 1218   LEUKOCYTESUR NEGATIVE 06/25/2023 1218   Sepsis Labs Recent Labs  Lab 07/04/24 1025 07/05/24 0516  WBC 4.4 5.7   Microbiology No results found for this or any previous visit (from the past 240 hours).  Please note: You were cared for by a hospitalist during your hospital stay. Once you are discharged, your primary care physician will handle any further medical issues. Please note that NO REFILLS for any discharge medications will be authorized once you are discharged, as it is imperative that you return to your primary care physician (or establish a relationship with a primary care physician  if you do not have one) for your post hospital discharge needs so that they can reassess your need for medications and monitor your lab values.    Time coordinating discharge: 40 minutes  SIGNED:   Ivonne Mustache, MD  [facility] 07/06/2024, 10:57 AM Pager [phone]  If 7PM-7AM, please contact night-coverage [email or web address] Password [email or web address]

## 2024-07-06 NOTE — Inpatient Diabetes Management (Signed)
 Inpatient Diabetes Program Recommendations  AACE/ADA: New Consensus Statement on Inpatient Glycemic Control (2015)  Target Ranges:  Prepandial:   less than 140 mg/dL      Peak postprandial:   less than 180 mg/dL (1-2 hours)      Critically ill patients:  140 - 180 mg/dL   Lab Results  Component Value Date   GLUCAP 230 (H) 07/06/2024   HGBA1C 10.6 (H) 07/05/2024    Review of Glycemic Control  Latest Reference Range & Units 07/04/24 22:50 07/05/24 07:16 07/05/24 11:28 07/05/24 16:27 07/05/24 21:49 07/06/24 07:39  Glucose-Capillary 70 - 99 mg/dL 735 (H) 748 (H) 711 (H) 276 (H) 208 (H) 230 (H)  (H): Data is abnormally high Diabetes history: Type 2 DM Outpatient Diabetes medications: Novolog  70/30 20 units Q/30 units QP, Metformin  1000 mg BID, Jardiance 10 mg QD Current orders for Inpatient glycemic control: Novolog  0-15 units TID & HS Prednisone  5 mg QD   Inpatient Diabetes Program Recommendations:  Spoke with patient regarding outpatient diabetes management. Daughter helps assist patient with injections and medications at home.  Reviewed patient's current A1c of 10.6%. Explained what a A1c is and what it measures. Also reviewed goal A1c with patient, importance of good glucose control @ home, and blood sugar goals. Reviewed patho of DM, need for improvement, impact to kidneys, adjusted insulin  dosage for discharge, when to call MD, CGMs, hypo vs hyper glycemia, interventions, vascular changes, importance of protein, importance of being mindful with CHO intake and sugary beverages, and other commorbidities.  Patient to follow up with PCP. Dexcom G7 sensor applied to upper right arm. Reviewed how to obtain additional sensors, how to work application and cost. Patient appreciate and no further questions.   Thanks, Tinnie Minus, MSN, RNC-OB Diabetes Coordinator [phone] (8a-5p)

## 2024-07-10 ENCOUNTER — Encounter (HOSPITAL_BASED_OUTPATIENT_CLINIC_OR_DEPARTMENT_OTHER): Payer: Self-pay

## 2024-07-10 ENCOUNTER — Ambulatory Visit (INDEPENDENT_AMBULATORY_CARE_PROVIDER_SITE_OTHER): Admitting: Podiatry

## 2024-07-10 VITALS — Ht 62.6 in | Wt 216.9 lb

## 2024-07-10 DIAGNOSIS — E1142 Type 2 diabetes mellitus with diabetic polyneuropathy: Secondary | ICD-10-CM | POA: Diagnosis not present

## 2024-07-10 MED ORDER — GABAPENTIN 300 MG PO CAPS: ORAL_CAPSULE | ORAL | 0 refills | Status: DC

## 2024-07-10 NOTE — Progress Notes (Unsigned)
 CARDIOLOGY OFFICE NOTE  Date:  07/12/2024    Rock LELON Hasten Date of Birth: 1960-12-03 Medical Record #[id number]  PCP:  [name], MD  Cardiologist:  Army ODESSIA Emmer    Chief Complaint  Patient presents with   Hospitalization Follow-up    Patient denies chest pain or shortness of breath.     History of Present Illness: Kathleen Kane is a 63 y.o. female who presents today for a follow up visit.   She has a history of persistent AF - on Eliquis , diastolic HF, OSA (does not use CPAP), HTN, DM, RA, obesity and prior tobacco abuse. Normal Myoview from 2014. Activity limited by RA. 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 DM poorly controlled A1c 8.0   Updated TTE 04/22/22 only mild MR and EF 60-65%   On chronic steroids since 2015 seen by pulmonary and has ILD associated with RA. She has failed other biologic agents with infections/intolerance Started on Actemra   CT 04/19/22 confirmed ILD Activity is limited by this and arthritis   Got azotemic with addition of zaroxyln for edema told to take only PRN in addition to her lasix  40 mg daily Most recent CT showed moderate upper lobe ILD No PE  BNP upper normal at 293 BUN 40 Cr 1.3 when zaroxyln stopped   Long discussion with her with ILD, autoimmune dx that she has not had any vaccines for Flu, COVID or RSV  Seen in ED 10/20/23 after mechanical falls. Head/Cervical spine films negative Was hypokalemic but had not been taking her supplemental K Repeat BMET 11/14/23 K 4.3 Cr 1.59 BUN 38   Admitted 06/27/24 with volume overload missed some doses of her lasix .  Echo not updated BNP 525 Cr 1.96 K 2.9 A1c 10.6 Troponin only 46 No CHF on CXR ILD noted Taking 10 mg of prednisone  daily for RA  She has poor insight into her chronic health issues. She does not weigh herself at home. Discussed this and  adjusting lasix /zaroxyln doses if weight up 3 lbs. 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 AHI-6.8/hr, during REM-27.3/hr. RDI-25.0/hr, during REM-40.9/hr. 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    Past Surgical History:  Procedure Laterality Date   CARDIOVASCULAR STRESS TEST  02/16/2013   no significant EKG changes with Lexiscan , normal LV function and normal wall function   CESAREAN SECTION  x2   DOPPLER ECHOCARDIOGRAPHY  01/24/2008   EF 55-60%, no diagnostic evidence of LV wall motion abnormalities, LV wall thickness was mild-moderately increased.   HAND SURGERY Right 2018   LAPAROTOMY Right 02/27/2013   Procedure: EXPLORATORY LAPAROTOMY, right salpingo-oopherectomy;  Surgeon: Sari Bachelor, MD;  Location: [facility];  Service: Gynecology;  Laterality: Right;   left breast cyst     SALPINGOOPHORECTOMY Right 02/27/2013   Procedure: SALPINGO OOPHORECTOMY;  Surgeon: Sari Bachelor, MD;  Location: [facility];  Service: Gynecology;  Laterality: Right;   TUBAL LIGATION Bilateral 1989      Medications: Current Meds  Medication Sig   apixaban  (ELIQUIS ) 5 MG TABS tablet TAKE 1 TABLET BY MOUTH TWICE A DAY   atorvastatin  (LIPITOR) 20 MG tablet TAKE 1 TABLET (20 MG TOTAL) BY MOUTH DAILY. PLEASE SCHEDULE YEARLY APPOINTMENT FOR FUTURE REFILLS. 1ST ATTEMPT. THANK YOU   B-D ULTRAFINE III SHORT PEN 31G X 8 MM MISC Inject into the skin 2 (two) times daily.   benzonatate  (TESSALON ) 100 MG capsule Take 1 capsule (100 mg total) by mouth every 8 (eight) hours.   cetirizine  (ZYRTEC ) 10 MG tablet Take 1 tablet (10 mg total) by mouth at bedtime.   diazepam  (VALIUM ) 5 MG tablet Take 1 tablet (5 mg total) by mouth every 6 (six) hours as needed for muscle spasms.   ferrous sulfate  325 (65 FE) MG tablet Take 1 tablet (325 mg total) by mouth daily with breakfast.   folic acid  (FOLVITE ) 1 MG tablet Take 1 mg by mouth every morning.   furosemide  (LASIX ) 40 MG tablet Take 1 tablet (40 mg total) by mouth daily.   gabapentin  (NEURONTIN ) 300 MG capsule Take 1 capsule (300 mg total) by mouth at bedtime for 30 days, THEN 1 capsule (300 mg total) 2 (two) times daily for 30 days, THEN 1 capsule (300 mg total) 3 (three) times daily.   JARDIANCE 10 MG TABS tablet Take 10 mg by mouth daily.   levalbuterol  (XOPENEX  HFA) 45 MCG/ACT inhaler Inhale 1-2 puffs into the lungs every 6 (six) hours as needed for wheezing.   metFORMIN  (GLUCOPHAGE ) 500 MG tablet Take 2 tablets (1,000 mg total) by mouth 2 (two) times daily.   metolazone  (ZAROXOLYN ) 5 MG tablet TAKE 1 TABLET EVERY OTHER DAY. TAKE 30 MINUTES PRIOR TO TAKING FUROSEMIDE  ( LASIX  )   metoprolol  tartrate (LOPRESSOR ) 25 MG tablet TAKE 1 TABLET BY MOUTH TWICE A DAY (Patient taking differently: Take 12.5 mg by mouth 2 (two) times daily.)   NOVOLOG  MIX 70/30 FLEXPEN (70-30) 100 UNIT/ML FlexPen Inject 0.4 mLs (40 Units total) into the skin 2 (two) times daily with a meal. (Patient taking differently: Inject 20-30 Units into the skin 2 (two) times daily with a meal. 20  in am, 30 in pm)   potassium chloride  SA (KLOR-CON  M) 20 MEQ tablet Take 2 tablets (40 mEq total) by mouth daily.   predniSONE  (DELTASONE ) 5 MG tablet Take 1 tablet (5 mg total) by mouth daily with breakfast.   senna (SENOKOT) 8.6 MG TABS tablet Take 1 tablet (8.6 mg total) by mouth daily.   sulfamethoxazole -trimethoprim  (BACTRIM  DS) 800-160 MG tablet Take 1 tablet by mouth 3 (three) times a week.   tiZANidine (ZANAFLEX) 4 MG tablet Take 4 mg by mouth 2 (two) times daily as needed for muscle spasms.   Tocilizumab -aazg (TYENNE ) 162 MG/0.9ML SOAJ Inject 162 mg into the skin once a week.     Allergies: Allergies  Allergen Reactions   Ace Inhibitors  Cough   Lisinopril  Cough    Social History: The patient  reports that she quit smoking about 10 years ago. Her smoking use included cigarettes. She started smoking about 47 years ago. She has a 37.6 pack-year smoking history. She has never been exposed to tobacco smoke. She has never used smokeless tobacco. She reports that she does not drink alcohol and does not use drugs.   Family History: The patient's family history includes Autoimmune disease in her daughter; Diabetes in her brother, mother, paternal grandmother, and sister; Healthy in her son; Heart Problems in her sister; Hypertension in her brother, father, and mother; Kidney failure in her sister; Throat cancer in her mother.   Review of Systems: Please see the history of present illness.   All other systems are reviewed and negative.   Physical Exam: VS:  BP 100/68 (BP Location: Left Arm, Patient Position: Sitting, Cuff Size: Normal)   Pulse 78   Ht 5' 2.75 (1.594 m)   Wt 215 lb 4 oz (97.6 kg)   LMP 10/28/2013   SpO2 93%   BMI 38.43 kg/m  .  BMI Body mass index is 38.43 kg/m.  Wt Readings from Last 3 Encounters:  07/12/24 215 lb 4 oz (97.6 kg)  07/10/24 216 lb 14.4 oz (98.4 kg)  07/06/24 216 lb 14.4 oz (98.4 kg)   Affect appropriate Overweight black female  Cushingoid HEENT: normal Neck supple with no adenopathy JVP normal no bruits no thyromegaly Lungs clear with no wheezing and good diaphragmatic motion Heart:  S1/S2 apical MR  murmur, no rub, gallop or click PMI normal Abdomen: benighn, BS positve, no tenderness, no AAA no bruit.  No HSM or HJR Distal pulses intact with no bruits Trace bilateral edema Neuro non-focal Skin warm and dry No muscular weakness    LABORATORY DATA:  EKG:  EKG is ordered today.  Personally reviewed by me. This demonstrates AF with controlled VR - HR is 87.  Lab Results  Component Value Date   WBC 6.6 12/29/2018   HGB 12.9 12/29/2018   HCT 45.7 12/29/2018   PLT 291 12/29/2018   GLUCOSE 140 (H) 12/29/2018   CHOL  01/24/2008    155        ATP III CLASSIFICATION:  <200     mg/dL   Desirable  799-760  mg/dL   Borderline High  >=759    mg/dL   High   TRIG 858 96/81/7990   HDL 30 (L) 01/24/2008   LDLCALC  01/24/2008    97        Total Cholesterol/HDL:CHD Risk Coronary Heart Disease Risk Table                     Men   Women  1/2 Average Risk   3.4   3.3   ALT 22 12/29/2018   AST 23 12/29/2018   NA 138 12/29/2018   K 3.7 12/29/2018   CL 105 12/29/2018   CREATININE 0.85 12/29/2018   BUN 20 12/29/2018   CO2 19 (L) 12/29/2018   TSH 1.162 Test methodology is 3rd generation TSH 01/23/2008   INR 1.08 01/10/2016   HGBA1C 8.5 (H) 10/02/2018     BNP (last 3 results) Recent Labs    01/05/24 0845  BNP 74.6     ProBNP (last 3 results) Recent Labs    07/04/24 1025  PROBNP 525.0*     Other Studies Reviewed Today:  TTE 04/22/22  IMPRESSIONS     1.  Left ventricular ejection fraction, by estimation, is 60 to 65%. The  left ventricle has normal function. The left ventricle has no regional  wall motion abnormalities. There is moderate concentric left ventricular  hypertrophy. Left ventricular  diastolic parameters are indeterminate.   2. Right ventricular systolic function is normal.  The right ventricular  size is normal.   3. Left atrial size was moderately dilated.   4. The mitral valve is normal in structure. Mild mitral valve  regurgitation.   5. The aortic valve is normal in structure. Aortic valve regurgitation is  not visualized.   6. The inferior vena cava is normal in size with greater than 50%  respiratory variability, suggesting right atrial pressure of 3 mmHg.    Assessment/Plan:  1. 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 HLD -  LDL 86 continue lipitor   4. Chronic diastolic dysfunction - BP is ok.  Echo 04/22/22 EF 60-65% mild LVH diastolic indeterminate due to afib  BNP not elevated  Continue lasix  40 mg and aldactone  12.5 mg Zaroxyln d/c due to azotemia only to be used PRN Recent admission for volume overload in setting of missed lasix  doses   5. Valvular heart disease - mild  MR by TTE 04/22/22   6. DM - poorly controlled A1c 8.0 f/u primary   7.  RA/ILD:  chronic steroids prednisone  10 mg CT confirmed ESR 39 needs to f/u with pulmonary /rheumatology Now on Actemra    F/U Dr Geronimo pulmonary Update TTE for MR and diastolic dysfunction BMET/BNP   Disposition:   FU with us  in a year      Patient is agreeable to this plan and will call if any problems develop in the interim.   Signed: Maude Emmer, MD  07/12/2024 3:25 PM  [facility] [address] [place], [place]  [address] Phone: [phone] Fax: [phone]

## 2024-07-10 NOTE — Patient Instructions (Signed)
  VISIT SUMMARY: Today, you were seen for burning and tingling sensations in your feet, which are likely due to nerve damage from your diabetes. We discussed your symptoms and created a plan to help manage them.  YOUR PLAN: -TYPE 2 DIABETES MELLITUS WITH DIABETIC POLYNEUROPATHY: Diabetic polyneuropathy is a type of nerve damage that can occur with diabetes, leading to symptoms like burning and tingling in the feet. To help manage your symptoms, you will start taking gabapentin , beginning with a low dose at night for one month, then increasing to twice a day for one month, and finally three times a day for one month. We also discussed the potential side effects of gabapentin , such as drowsiness and sleepiness. Additionally, you will be referred to a pain clinic specialist for further management. Controlling your blood glucose levels is crucial to help decrease the intensity and severity of your symptoms.  INSTRUCTIONS: Please follow the prescribed gabapentin  dosage schedule and monitor for any side effects. You will also be referred to a pain clinic specialist for further management of your diabetic polyneuropathy. It is important to maintain good blood glucose control to help manage your symptoms. Follow up with your primary care provider as needed.                      Contains text generated by [employer]bridge.                                 Contains text generated by Abridge.

## 2024-07-10 NOTE — Progress Notes (Signed)
  Subjective:  Patient ID: Kathleen Kane, female    DOB: [birth year],  MRN: [id number]  Chief Complaint  Patient presents with   Diabetes    RM Both feet are in pain and unable to put any pressure on feet/Diabetic (A1C 10).  Patient states pain has been present for the past five years. Patient states tingling sensation in feet, especially when resting.    Discussed the use of AI scribe software for clinical note transcription with the patient, who gave verbal consent to proceed.  History of Present Illness Kathleen Kane is a 62 year old female with diabetes who presents with burning and tingling in her feet.  She experiences constant burning and tingling sensations in her feet, particularly noticeable on her heel bone. The symptoms are persistent and not intermittent. She sometimes experiences pain when walking. No back issues.  She has not previously been on medications such as gabapentin  or Lyrica for nerve pain.      Objective:    Physical Exam VASCULAR: DP and PT pulse palpable. Foot is warm and well-perfused. Capillary fill time is brisk. DERMATOLOGIC: Normal skin turgor, texture, and temperature. No open lesions, rashes, or ulcerations. NEUROLOGIC: Decreased peripheral sensation with paresthesias. ORTHOPEDIC: Smooth, pain-free range of motion of all examined joints. No ecchymosis or bruising. No gross deformity. No pain to palpation. EXTREMITIES: General swelling in extremities, no fluid or abscess.   No images are attached to the encounter.    Results     Assessment:   1. Diabetic peripheral neuropathy associated with type 2 diabetes mellitus (HCC)      Plan:  Patient was evaluated and treated and all questions answered.  Assessment and Plan Assessment & Plan Type 2 diabetes mellitus with diabetic polyneuropathy Chronic diabetic polyneuropathy with burning and tingling in the feet, likely due to nerve damage from prolonged hyperglycemia. No back issues  reported. The condition is not curable but can be managed by controlling blood glucose levels and using medications to alleviate symptoms. - Prescribe gabapentin , starting with a low dose at night for one month, then increase to twice a day for one month, and then three times a day for one month. Discussed the wide dose range of gabapentin  and potential side effects such as drowsiness and sleepiness. - Refer to a pain clinic specialist for further management of diabetic polyneuropathy. - Advise on blood glucose control to help decrease the intensity and severity of symptoms.      Return if symptoms worsen or fail to improve.

## 2024-07-12 ENCOUNTER — Encounter: Payer: Self-pay | Admitting: Cardiovascular Disease

## 2024-07-12 ENCOUNTER — Ambulatory Visit: Attending: Cardiovascular Disease | Admitting: Cardiovascular Disease

## 2024-07-12 ENCOUNTER — Other Ambulatory Visit: Payer: Self-pay

## 2024-07-12 VITALS — BP 100/68 | HR 78 | Ht 62.75 in | Wt 215.2 lb

## 2024-07-12 DIAGNOSIS — I340 Nonrheumatic mitral (valve) insufficiency: Secondary | ICD-10-CM

## 2024-07-12 DIAGNOSIS — I5189 Other ill-defined heart diseases: Secondary | ICD-10-CM | POA: Diagnosis not present

## 2024-07-12 DIAGNOSIS — R0600 Dyspnea, unspecified: Secondary | ICD-10-CM

## 2024-07-12 NOTE — Patient Instructions (Addendum)
 Medication Instructions:  Your physician recommends that you continue on your current medications as directed. Please refer to the Current Medication list given to you today.  *If you need a refill on your cardiac medications before your next appointment, please call your pharmacy*  Lab Work: Your physician recommends that you have lab work today- BMET and BNP  If you have labs (blood work) drawn today and your tests are completely normal, you will receive your results only by: MyChart Message (if you have MyChart) OR A paper copy in the mail If you have any lab test that is abnormal or we need to change your treatment, we will call you to review the results.  Testing/Procedures: Your physician has requested that you have an echocardiogram. Echocardiography is a painless test that uses sound waves to create images of your heart. It provides your doctor with information about the size and shape of your heart and how well your heart's chambers and valves are working. This procedure takes approximately one hour. There are no restrictions for this procedure. Please do NOT wear cologne, perfume, aftershave, or lotions (deodorant is allowed). Please arrive 15 minutes prior to your appointment time.  Please note: We ask at that you not bring children with you during ultrasound (echo/ vascular) testing. Due to room size and safety concerns, children are not allowed in the ultrasound rooms during exams. Our front office staff cannot provide observation of children in our lobby area while testing is being conducted. An adult accompanying a patient to their appointment will only be allowed in the ultrasound room at the discretion of the ultrasound technician under special circumstances. We apologize for any inconvenience. Follow-Up: At [facility], you and your health needs are our priority.  As part of our continuing mission to provide you with exceptional heart care, our providers are all part  of one team.  This team includes your primary Cardiologist (physician) and Advanced Practice Providers or APPs (Physician Assistants and Nurse Practitioners) who all work together to provide you with the care you need, when you need it.  Your next appointment:   1 year(s)  Provider:   Maude Emmer, MD    We recommend signing up for the patient portal called MyChart.  Sign up information is provided on this After Visit Summary.  MyChart is used to connect with patients for Virtual Visits (Telemedicine).  Patients are able to view lab/test results, encounter notes, upcoming appointments, etc.  Non-urgent messages can be sent to your provider as well.   To learn more about what you can do with MyChart, go to [email or web address].   Your physician recommends that you schedule a follow-up appointment with Dr. Geronimo as soon as possible.

## 2024-07-14 LAB — BASIC METABOLIC PANEL WITH GFR
BUN/Creatinine Ratio: 19 (ref 12–28)
BUN: 51 mg/dL — ABNORMAL HIGH (ref 8–27)
CO2: 17 mmol/L — ABNORMAL LOW (ref 20–29)
Calcium: 10.6 mg/dL — ABNORMAL HIGH (ref 8.7–10.3)
Chloride: 93 mmol/L — ABNORMAL LOW (ref 96–106)
Creatinine, Ser: 2.67 mg/dL — ABNORMAL HIGH (ref 0.57–1.00)
Glucose: 159 mg/dL — ABNORMAL HIGH (ref 70–99)
Potassium: 3.4 mmol/L — ABNORMAL LOW (ref 3.5–5.2)
Sodium: 139 mmol/L (ref 134–144)
eGFR: 19 mL/min/1.73 — ABNORMAL LOW (ref >59)

## 2024-07-14 LAB — PRO B NATRIURETIC PEPTIDE: NT-Pro BNP: 530 pg/mL — ABNORMAL HIGH (ref 0–301)

## 2024-07-18 ENCOUNTER — Other Ambulatory Visit: Payer: Self-pay

## 2024-07-18 NOTE — Progress Notes (Signed)
 Specialty Pharmacy Refill Coordination Note  Kathleen Kane is a 63 y.o. female contacted today regarding refills of specialty medication(s) Tocilizumab -aazg ([place] )   Patient requested Delivery   Delivery date: 07/20/24   Verified address: [address]   [place] [address]   Medication will be filled on 07/19/24.

## 2024-07-19 ENCOUNTER — Other Ambulatory Visit: Payer: Self-pay

## 2024-07-24 ENCOUNTER — Encounter: Payer: Self-pay | Admitting: Physical Medicine and Rehabilitation

## 2024-08-07 ENCOUNTER — Telehealth: Payer: Self-pay | Admitting: *Deleted

## 2024-08-07 NOTE — Telephone Encounter (Signed)
 ATC patient x1.  No answer and VM was not set up.  I do not see where she was recently hospitalized.  I called to get clarification on when and where she was hospitalized.  Unable to connect with patient.  Earlier the admission was not showing up in Epic from [facility] from the end of August.  Now it is showing up.  She was admitted for CHF.  Nothing further needed.

## 2024-08-08 ENCOUNTER — Encounter: Payer: Self-pay | Admitting: Adult Health

## 2024-08-08 ENCOUNTER — Ambulatory Visit: Admitting: Adult Health

## 2024-08-08 VITALS — BP 118/62 | HR 72 | Temp 98.4°F | Ht 62.75 in | Wt 226.6 lb

## 2024-08-08 DIAGNOSIS — M059 Rheumatoid arthritis with rheumatoid factor, unspecified: Secondary | ICD-10-CM

## 2024-08-08 DIAGNOSIS — M359 Systemic involvement of connective tissue, unspecified: Secondary | ICD-10-CM

## 2024-08-08 DIAGNOSIS — E1122 Type 2 diabetes mellitus with diabetic chronic kidney disease: Secondary | ICD-10-CM

## 2024-08-08 DIAGNOSIS — Z794 Long term (current) use of insulin: Secondary | ICD-10-CM

## 2024-08-08 DIAGNOSIS — N1832 Chronic kidney disease, stage 3b: Secondary | ICD-10-CM

## 2024-08-08 DIAGNOSIS — I5032 Chronic diastolic (congestive) heart failure: Secondary | ICD-10-CM

## 2024-08-08 DIAGNOSIS — Z7952 Long term (current) use of systemic steroids: Secondary | ICD-10-CM

## 2024-08-08 DIAGNOSIS — Z79899 Other long term (current) drug therapy: Secondary | ICD-10-CM | POA: Diagnosis not present

## 2024-08-08 DIAGNOSIS — J8489 Other specified interstitial pulmonary diseases: Secondary | ICD-10-CM | POA: Diagnosis not present

## 2024-08-08 MED ORDER — PREDNISONE 10 MG PO TABS: 10.0000 mg | ORAL_TABLET | Freq: Every day | ORAL | 5 refills | Status: AC

## 2024-08-08 NOTE — Progress Notes (Signed)
 She  @Patient  ID: Kathleen Kane, female    DOB: 06-16-1961, 63 y.o.   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  She was hospitalized in August for decompensated congestive heart failure, requiring aggressive diuresis. Since discharge, she has improved She has stage three chronic kidney disease and experienced hypokalemia during her hospital stay, which required potassium supplementation.  She has followed up with cardiology since discharge.  Feels that she is doing better.  Denies any increased leg swelling.  Is currently on fluid restrictions and a low-salt diet  She has been on Actemra  since January 2024, administered weekly, and prednisone , which was reduced from 10 mg to 5 mg daily in July. However, following her recent hospitalization, she resumed 10 mg daily due to increased joint pain. She experiences significant pain when on the lower dose of prednisone . She is also on Bactrim  three times a week to prevent opportunistic infections.  She has diabetes, which has been difficult to manage  due to high blood sugar levels. Recently, she experienced episodes of hypoglycemia, with blood sugar dropping to the 50s, prompting her daughter to call 911. She is on insulin  Twice daily  , recently stopped afternoon dose due to low sugars.   She is on Eliquis  for atrial fibrillation and reports adherence to this medication. She also takes Zyrtec  as needed for allergies and has an inhaler, Xopenex , which she does not use.  She is active, walking with a walker in the home short distances, having to rest frequently and occasionally using a wheelchair when in pain. She is mindful of her salt intake and has been placed on a fluid restriction to manage her heart condition. She has not seen her primary care doctor since her hospital discharge but has an upcoming appointment next week.  She has never been on oxygen.       Allergies  Allergen Reactions   Ace Inhibitors Cough   Lisinopril  Cough     There is no immunization history on file for this patient.  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 AHI-6.8/hr, during REM-27.3/hr. RDI-25.0/hr, during REM-40.9/hr. 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    Tobacco History: Social History   Tobacco Use  Smoking Status Former   Current packs/day: 0.00   Average packs/day: 1 pack/day for 37.6 years (37.6 ttl pk-yrs)   Types: Cigarettes   Start date: 13   Quit date: 06/08/2014   Years since quitting: 10.1   Passive exposure: Never  Smokeless Tobacco Never   Counseling given: Not Answered   Outpatient Medications Prior to Visit  Medication Sig Dispense Refill   apixaban  (ELIQUIS ) 5 MG TABS tablet TAKE 1 TABLET BY MOUTH TWICE A DAY 60 tablet 5   atorvastatin  (LIPITOR) 20 MG tablet TAKE 1 TABLET (20 MG TOTAL) BY MOUTH DAILY. PLEASE SCHEDULE YEARLY APPOINTMENT FOR FUTURE REFILLS. 1ST ATTEMPT. THANK YOU 30 tablet 2   B-D ULTRAFINE III SHORT PEN 31G X 8 MM MISC Inject into the skin 2 (two) times daily.     cetirizine  (ZYRTEC ) 10 MG tablet Take 1 tablet (10 mg total) by mouth at bedtime. 30 tablet 0   diazepam  (VALIUM ) 5 MG tablet Take 1 tablet (5 mg total) by mouth every 6 (six) hours as needed for muscle spasms. 15 tablet 0   ferrous sulfate  325 (65 FE) MG tablet Take 1 tablet (325 mg total) by mouth daily with breakfast. 30 tablet 3   furosemide  (LASIX ) 40 MG tablet Take 1 tablet (40 mg total) by mouth daily. 45 tablet 0   JARDIANCE 10 MG TABS tablet Take 10 mg by mouth daily.     metFORMIN  (GLUCOPHAGE ) 500 MG tablet Take 2 tablets (1,000 mg total) by mouth 2 (two) times daily. 60 tablet 0   metolazone  (ZAROXOLYN ) 5 MG tablet TAKE 1 TABLET EVERY OTHER DAY. TAKE 30 MINUTES PRIOR TO TAKING FUROSEMIDE  ( LASIX  ) 45 tablet 3   metoprolol  tartrate (LOPRESSOR ) 25 MG tablet TAKE 1 TABLET BY MOUTH TWICE A DAY 180 tablet 1   NOVOLOG  MIX 70/30 FLEXPEN (70-30) 100 UNIT/ML FlexPen Inject 0.4 mLs (40 Units total) into the skin 2 (two) times daily with a meal. 15 mL 0   potassium chloride  SA (KLOR-CON  M) 20 MEQ tablet Take 2 tablets (40 mEq total) by mouth daily. 60 tablet 0   sulfamethoxazole -trimethoprim   (BACTRIM  DS) 800-160 MG tablet Take 1 tablet by mouth 3 (three) times a week. 12 tablet 11   tiZANidine (ZANAFLEX) 4 MG tablet Take 4 mg by mouth 2 (two) times daily as needed for muscle spasms.     Tocilizumab -aazg (TYENNE ) 162 MG/0.9ML SOAJ Inject 162 mg into the skin once a week. 3.6 mL 2   predniSONE  (DELTASONE ) 5 MG tablet Take 1 tablet (5 mg total) by mouth daily with breakfast. 30 tablet 5   benzonatate  (TESSALON ) 100 MG capsule Take 1 capsule (100 mg total) by mouth every 8 (eight) hours. (Patient not taking: Reported on 08/08/2024) 21 capsule 0   folic acid  (FOLVITE ) 1 MG tablet Take 1 mg by mouth every morning. (Patient not taking: Reported on 08/08/2024)  3   gabapentin  (NEURONTIN ) 300 MG capsule Take 1 capsule (300 mg total) by mouth at bedtime for 30 days, THEN 1 capsule (300 mg total) 2 (two) times daily for 30 days, THEN 1 capsule (300  mg total) 3 (three) times daily. (Patient not taking: No sig reported) 180 capsule 0   levalbuterol  (XOPENEX  HFA) 45 MCG/ACT inhaler Inhale 1-2 puffs into the lungs every 6 (six) hours as needed for wheezing. (Patient not taking: Reported on 08/08/2024) 1 each 0   senna (SENOKOT) 8.6 MG TABS tablet Take 1 tablet (8.6 mg total) by mouth daily. (Patient not taking: Reported on 08/08/2024) 14 tablet 0   No facility-administered medications prior to visit.     Review of Systems:   Constitutional:   No  weight loss, night sweats,  Fevers, chills, +fatigue, or  lassitude.  HEENT:   No headaches,  Difficulty swallowing,  Tooth/dental problems, or  Sore throat,                No sneezing, itching, ear ache, nasal congestion, post nasal drip,   CV:  No chest pain,  Orthopnea, PND, swelling in lower extremities, anasarca, dizziness, palpitations, syncope.   GI  No heartburn, indigestion, abdominal pain, nausea, vomiting, diarrhea, change in bowel habits, loss of appetite, bloody stools.   Resp: No shortness of breath with exertion or at rest.  No excess  mucus, no productive cough,  No non-productive cough,  No coughing up of blood.  No change in color of mucus.  No wheezing.  No chest wall deformity  Skin: no rash or lesions.  GU: no dysuria, change in color of urine, no urgency or frequency.  No flank pain, no hematuria   MS:  + joint pain     Physical Exam  BP 118/62 (BP Location: Left Arm, Patient Position: Sitting, Cuff Size: Large)   Pulse 72   Temp 98.4 F (36.9 C) (Oral)   Ht 5' 2.75 (1.594 m)   Wt 226 lb 9.6 oz (102.8 kg)   LMP 10/28/2013   SpO2 96%   BMI 40.46 kg/m   GEN: A/Ox3; pleasant , NAD, well nourished , wc    HEENT:  [place]/AT,   , NOSE-clear, THROAT-clear, no lesions, no postnasal drip or exudate noted.   NECK:  Supple w/ fair ROM; no JVD; normal carotid impulses w/o bruits; no thyromegaly or nodules palpated; no lymphadenopathy.    RESP  BB crackles . no accessory muscle use, no dullness to percussion  CARD:  RRR, no m/r/g, 1+ peripheral edema, pulses intact, no cyanosis or clubbing.  GI:   Soft & nt; nml bowel sounds; no organomegaly or masses detected.   Musco: Warm bil, no deformities or joint swelling noted.   Neuro: alert, no focal deficits noted.    Skin: Warm, no lesions or rashes    Lab Results:  CBC  BNP  No results found.  Administration History     None          Latest Ref Rng & Units 05/28/2024    7:50 AM 05/23/2023    9:02 AM 02/25/2023    9:11 AM 11/16/2022   11:59 AM 10/19/2022    8:52 AM 04/13/2022   12:52 PM  PFT Results  FVC-Pre L 1.93  2.05  1.90  1.81  1.94  2.18   FVC-Predicted Pre % 63  67  61  60  65  92   FVC-Post L      2.35   FVC-Predicted Post %      99   Pre FEV1/FVC % % 89  93  93  90  92  80   Post FEV1/FCV % %      91  FEV1-Pre L 1.71  1.90  1.77  1.62  1.79  1.75   FEV1-Predicted Pre % 74  81  75  70  78  95   FEV1-Post L      2.15   DLCO uncorrected ml/min/mmHg 10.60  10.92  8.48  10.13  9.79  10.11   DLCO UNC% % 55  57  44  54  52  54   DLCO  corrected ml/min/mmHg 10.60  10.75  8.48  10.70  10.33  11.21   DLCO COR %Predicted % 55  56  44  57  55  60   DLVA Predicted % 79  76  99  80  73  79   TLC L      3.20   TLC % Predicted %      67   RV % Predicted %      -37     No results found for: NITRICOXIDE      Assessment & Plan:   Assessment and Plan Assessment & Plan 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 Recent hospitalization chest xray revealed chronic interstitial changes without acute findings. She reports increased pain when prednisone  was reduced to 5 mg, so it is now back to 10 mg daily. No oxygen is currently required. Pulmonary fibrosis is noted with crackles on auscultation, i Continue Actemra  injections weekly and prednisone  10 mg daily. A CT scan is scheduled. Educate on taking prednisone  with morning medications and food. High-resolution CT chest is pending for December.  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 Prophylactic Bactrim  is prescribed to prevent opportunistic infections. Although she is only on Prednisone  10mg , she has underlying DM and dual immunosuppression would continue PJP for now . She is educated on balancing immunosuppression to manage rheumatoid arthritis while preventing infections. Continue Bactrim  three times a week and educate on the importance of infection prevention.  Congestive heart failure   She was hospitalized in August for decompensated congestive heart failure requiring aggressive diuresis. Currently, she is on fluid restriction to manage fluid balance. Continue fluid restriction and follow up with cardiology to monitor heart failure management.  Appears euvolemic on exam  Atrial  fibrillation   Chronic atrial fibrillation on  Eliquis , with no new symptoms reported.  follow-up with cardiology as planned   Chronic kidney disease stage 3   She has underlying stage 3 chronic kidney disease. Recent hospitalization noted hypokalemia requiring potassium supplementation,  Recent lab work with cardiology showed worsening kidney function .encouraged her to follow-up with cardiology as planned may need nephrology referral going forward  Unable to check labs today in the office as our lab is unavailable.  Advised her to follow-up with primary care and cardiology for ongoing management   Type 2 diabetes mellitus   Diabetes management is complicated by prednisone  use, leading to fluctuating blood sugar levels. Recent episodes of hypoglycemia have been reported, with blood sugar dropping to the 50s.Follow up with primary care for diabetes management and potential insulin  adjustment. Monitor blood sugar levels closely, especially in the evening.  She does have an upcoming referral to diabetic clinic later this month.  Encouraged to keep follow-up     Plan  Patient Instructions  -Continue on Actemra  weekly injection -Continue Prednisone  10 mg/day -  Continue on  Bactrim  1 double strength tablet Monday Wednesday Friday to prevent opportunistic infections - Do HRCT chest as planned next month  -follow up with Cardiology as planned  -follow up with Primary provider next week as planned  Follow up 2 months and As needed     I spent 41   minutes dedicated to the care of this patient on the date of this encounter to include pre-visit review of records, face-to-face time with the patient discussing conditions above, post visit ordering of testing, clinical documentation with the electronic health record, making appropriate referrals as documented, and communicating necessary findings to members of the patients care team.     Madelin Stank, NP 08/08/2024

## 2024-08-08 NOTE — Patient Instructions (Addendum)
-  Continue on Actemra  weekly injection -Continue Prednisone  10 mg/day -Continue on  Bactrim  1 double strength tablet Monday Wednesday Friday to prevent opportunistic infections - Do HRCT chest as planned next month  -follow up with Cardiology as planned  -follow up with Primary provider next week as planned  Follow up 2 months and As needed

## 2024-08-09 ENCOUNTER — Other Ambulatory Visit: Payer: Self-pay

## 2024-08-13 ENCOUNTER — Other Ambulatory Visit: Payer: Self-pay

## 2024-08-13 NOTE — Progress Notes (Signed)
 Specialty Pharmacy Refill Coordination Note  Kathleen Kane is a 63 y.o. female contacted today regarding refills of specialty medication(s) Tocilizumab -aazg (Tyenne )   Patient requested Delivery   Delivery date: 08/16/24   Verified address: [address]   [place] [address]   Medication will be filled on 08/15/24.

## 2024-08-14 ENCOUNTER — Other Ambulatory Visit: Payer: Self-pay

## 2024-08-21 ENCOUNTER — Ambulatory Visit (HOSPITAL_COMMUNITY)
Admission: RE | Admit: 2024-08-21 | Discharge: 2024-08-21 | Disposition: A | Discharge status: Home / Self Care | Source: Ambulatory Visit | Attending: Internal Medicine | Admitting: Internal Medicine

## 2024-08-21 ENCOUNTER — Ambulatory Visit: Payer: Self-pay | Admitting: Cardiovascular Disease

## 2024-08-21 DIAGNOSIS — R0600 Dyspnea, unspecified: Secondary | ICD-10-CM | POA: Insufficient documentation

## 2024-08-21 DIAGNOSIS — I340 Nonrheumatic mitral (valve) insufficiency: Secondary | ICD-10-CM | POA: Diagnosis present

## 2024-08-21 DIAGNOSIS — I5189 Other ill-defined heart diseases: Secondary | ICD-10-CM | POA: Diagnosis present

## 2024-08-21 LAB — ECHOCARDIOGRAM COMPLETE: S' Lateral: 3 cm

## 2024-08-27 ENCOUNTER — Encounter: Admitting: Physical Medicine and Rehabilitation

## 2024-09-05 ENCOUNTER — Other Ambulatory Visit: Payer: Self-pay

## 2024-09-05 ENCOUNTER — Ambulatory Visit: Admitting: Podiatry

## 2024-09-11 ENCOUNTER — Other Ambulatory Visit (HOSPITAL_COMMUNITY): Payer: Self-pay

## 2024-09-11 ENCOUNTER — Other Ambulatory Visit: Payer: Self-pay | Admitting: Internal Medicine

## 2024-09-11 DIAGNOSIS — M059 Rheumatoid arthritis with rheumatoid factor, unspecified: Secondary | ICD-10-CM

## 2024-09-11 DIAGNOSIS — J8489 Other specified interstitial pulmonary diseases: Secondary | ICD-10-CM

## 2024-09-11 NOTE — Telephone Encounter (Signed)
 Pt requesting refill of specialty medication - routing to Rx team to advise.

## 2024-09-12 ENCOUNTER — Other Ambulatory Visit (HOSPITAL_COMMUNITY): Payer: Self-pay

## 2024-09-12 ENCOUNTER — Other Ambulatory Visit: Payer: Self-pay

## 2024-09-12 MED ORDER — TYENNE 162 MG/0.9ML ~~LOC~~ SOAJ
162.0000 mg | SUBCUTANEOUS | 2 refills | Status: DC
  Filled 2024-09-12 – 2024-09-14 (×2): qty 3.6, 28d supply, fill #0
  Filled 2024-10-11: qty 3.6, 28d supply, fill #1
  Filled 2024-11-06: qty 3.6, 28d supply, fill #2

## 2024-09-12 NOTE — Telephone Encounter (Signed)
 Refill sent for Kathleen Kane  to [facility]: [phone]   Dose: 162mg  [place] once weekly  Last OV: 08/08/24 Provider: Dr. Geronimo Pertinent labs: stable - LFTs 07/04/24, CBC w/ diff 06/08/24   Next OV: 10/30/24  Kathleen Kane, PharmD, BCPS Clinical Pharmacist  [facility]

## 2024-09-14 ENCOUNTER — Other Ambulatory Visit: Payer: Self-pay

## 2024-09-14 NOTE — Progress Notes (Signed)
 Specialty Pharmacy Refill Coordination Note  Kathleen Kane is a 63 y.o. female contacted today regarding refills of specialty medication(s) Tocilizumab -aazg (Tyenne )   Patient requested [name] at [facility] at [place] date: 09/14/24   Medication will be filled on: 09/14/24

## 2024-10-01 ENCOUNTER — Other Ambulatory Visit: Payer: Self-pay | Admitting: Internal Medicine

## 2024-10-01 DIAGNOSIS — Z1239 Encounter for other screening for malignant neoplasm of breast: Secondary | ICD-10-CM

## 2024-10-10 ENCOUNTER — Other Ambulatory Visit: Payer: Self-pay

## 2024-10-11 ENCOUNTER — Other Ambulatory Visit: Payer: Self-pay

## 2024-10-12 ENCOUNTER — Other Ambulatory Visit: Payer: Self-pay

## 2024-10-12 ENCOUNTER — Other Ambulatory Visit: Payer: Self-pay | Admitting: Pharmacy Technician

## 2024-10-12 NOTE — Progress Notes (Signed)
 Specialty Pharmacy Refill Coordination Note  Kathleen Kane is a 63 y.o. female contacted today regarding refills of specialty medication(s) Tocilizumab -aazg (Tyenne )   Patient requested (Patient-Rptd) Pickup at [facility] at [facility] date: (Patient-Rptd) 10/12/24   Medication will be filled on: 10/12/2024

## 2024-10-20 ENCOUNTER — Ambulatory Visit (HOSPITAL_BASED_OUTPATIENT_CLINIC_OR_DEPARTMENT_OTHER): Admission: RE | Admit: 2024-10-20 | Discharge: 2024-10-20 | Attending: Internal Medicine | Admitting: Internal Medicine

## 2024-10-20 DIAGNOSIS — M359 Systemic involvement of connective tissue, unspecified: Secondary | ICD-10-CM

## 2024-10-20 DIAGNOSIS — Z7952 Long term (current) use of systemic steroids: Secondary | ICD-10-CM

## 2024-10-20 DIAGNOSIS — Z87891 Personal history of nicotine dependence: Secondary | ICD-10-CM

## 2024-10-20 DIAGNOSIS — D849 Immunodeficiency, unspecified: Secondary | ICD-10-CM

## 2024-10-20 DIAGNOSIS — Z79899 Other long term (current) drug therapy: Secondary | ICD-10-CM

## 2024-10-23 ENCOUNTER — Other Ambulatory Visit: Payer: Self-pay | Admitting: Internal Medicine

## 2024-10-23 DIAGNOSIS — N63 Unspecified lump in unspecified breast: Secondary | ICD-10-CM

## 2024-10-23 NOTE — Progress Notes (Unsigned)
 Subjective:    Patient ID: Kathleen Kane, female    DOB: 04/24/1961, 63 y.o.   MRN: [id number]  HPI   Pain Inventory Average Pain {NUMBERS; 0-10:5044} Pain Right Now {NUMBERS; 0-10:5044} My pain is {PAIN DESCRIPTION:21022940}  In the last 24 hours, has pain interfered with the following? General activity {NUMBERS; 0-10:5044} Relation with others {NUMBERS; 0-10:5044} Enjoyment of life {NUMBERS; 0-10:5044} What TIME of day is your pain at its worst? {time of day:24191} Sleep (in general) {BHH GOOD/FAIR/POOR:22877}  Pain is worse with: {ACTIVITIES:21022942} Pain improves with: {PAIN IMPROVES TPUY:78977056} Relief from Meds: {NUMBERS; 0-10:5044}  {MOBILITY JIO:78977055}  {FUNCTION:21022946}  {NEURO/PSYCH:21022948}  {CPRM PRIOR STUDIES:21022953}  {CPRM PHYSICIANS INVOLVED IN YOUR CARE:21022954}    Family History  Problem Relation Age of Onset   Hypertension Mother    Diabetes Mother    Throat cancer Mother    Hypertension Father    Diabetes Sister    Heart Problems Sister    Kidney failure Sister    Hypertension Brother    Diabetes Brother    Diabetes Paternal Grandmother    Autoimmune disease Daughter    Healthy Son    Social History   Socioeconomic History   Marital status: Married    Spouse name: Montijo,Theodore   Number of children: 2   Years of education: Not on file   Highest education level: Not on file  Occupational History   Not on file  Tobacco Use   Smoking status: Former    Current packs/day: 0.00    Average packs/day: 1 pack/day for 37.6 years (37.6 ttl pk-yrs)    Types: Cigarettes    Start date: 71    Quit date: 06/08/2014    Years since quitting: 10.3    Passive exposure: Never   Smokeless tobacco: Never  Vaping Use   Vaping status: Former  Substance and Sexual Activity   Alcohol use: No    Alcohol/week: 0.0 standard drinks of alcohol   Drug use: No   Sexual activity: Yes    Partners: Male  Other Topics Concern   Not on  file  Social History Narrative   Not on file   Social Drivers of Health   Tobacco Use: Medium Risk (08/08/2024)   Patient History    Smoking Tobacco Use: Former    Smokeless Tobacco Use: Never    Passive Exposure: Never  Programmer, Applications: Not on file  Food Insecurity: No Food Insecurity (07/04/2024)   Epic    Worried About Programme Researcher, Broadcasting/film/video in the Last Year: Never true    Ran Out of Food in the Last Year: Never true  Transportation Needs: No Transportation Needs (07/04/2024)   Epic    Lack of Transportation (Medical): No    Lack of Transportation (Non-Medical): No  Physical Activity: Not on file  Stress: Not on file  Social Connections: Moderately Isolated (07/04/2024)   Social Connection and Isolation Panel    Frequency of Communication with Friends and Family: Twice a week    Frequency of Social Gatherings with Friends and Family: Three times a week    Attends Religious Services: Never    Active Member of Clubs or Organizations: No    Attends Banker Meetings: Never    Marital Status: Married  Depression (PHQ2-9): Not on file  Alcohol Screen: Not on file  Housing: Low Risk (07/04/2024)   Epic    Unable to Pay for Housing in the Last Year: No    Number of Times  Moved in the Last Year: 0    Homeless in the Last Year: No  Utilities: Not At Risk (07/04/2024)   Epic    Threatened with loss of utilities: No  Health Literacy: Not on file   Past Surgical History:  Procedure Laterality Date   CARDIOVASCULAR STRESS TEST  02/16/2013   no significant EKG changes with Lexiscan , normal LV function and normal wall function   CESAREAN SECTION     x2   DOPPLER ECHOCARDIOGRAPHY  01/24/2008   EF 55-60%, no diagnostic evidence of LV wall motion abnormalities, LV wall thickness was mild-moderately increased.   HAND SURGERY Right 2018   LAPAROTOMY Right 02/27/2013   Procedure: EXPLORATORY LAPAROTOMY, right salpingo-oopherectomy;  Surgeon: Sari Bachelor, MD;   Location: [facility];  Service: Gynecology;  Laterality: Right;   left breast cyst     SALPINGOOPHORECTOMY Right 02/27/2013   Procedure: SALPINGO OOPHORECTOMY;  Surgeon: Sari Bachelor, MD;  Location: [facility];  Service: Gynecology;  Laterality: Right;   TUBAL LIGATION Bilateral 1989   Past Medical History:  Diagnosis Date   Acute on chronic diastolic CHF (congestive heart failure) (HCC) 08/08/2015   Anxiety    Atrial fibrillation, persistent (HCC)    Carpal tunnel syndrome, right 12/15/2016   CHF (congestive heart failure) (HCC) 2013   No echo found from that time, most likely diastolic   CHF exacerbation (HCC) 10/01/2018   Depression    Diabetes mellitus (HCC) 08/10/2012   Diabetes mellitus without complication (HCC)    Hypertension    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 AHI-6.8/hr, during REM-27.3/hr. RDI-25.0/hr, during REM-40.9/hr. 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   LMP 10/28/2013   Opioid Risk Score:   Fall Risk Score:  `1  Depression screen PHQ 2/9      No data to display          Review of Systems     Objective:   Physical Exam        Assessment & Plan:

## 2024-10-24 ENCOUNTER — Encounter: Admitting: Physical Medicine and Rehabilitation

## 2024-10-24 ENCOUNTER — Encounter: Payer: Self-pay | Admitting: Physical Medicine and Rehabilitation

## 2024-10-24 VITALS — BP 124/72 | HR 97 | Ht 62.0 in | Wt 214.0 lb

## 2024-10-24 DIAGNOSIS — M059 Rheumatoid arthritis with rheumatoid factor, unspecified: Secondary | ICD-10-CM | POA: Diagnosis not present

## 2024-10-24 DIAGNOSIS — M25512 Pain in left shoulder: Secondary | ICD-10-CM | POA: Diagnosis not present

## 2024-10-24 DIAGNOSIS — M25511 Pain in right shoulder: Secondary | ICD-10-CM | POA: Diagnosis not present

## 2024-10-24 DIAGNOSIS — E1142 Type 2 diabetes mellitus with diabetic polyneuropathy: Secondary | ICD-10-CM | POA: Diagnosis not present

## 2024-10-24 DIAGNOSIS — R269 Unspecified abnormalities of gait and mobility: Secondary | ICD-10-CM | POA: Diagnosis not present

## 2024-10-24 DIAGNOSIS — G8929 Other chronic pain: Secondary | ICD-10-CM | POA: Diagnosis not present

## 2024-10-24 DIAGNOSIS — Z794 Long term (current) use of insulin: Secondary | ICD-10-CM

## 2024-10-24 DIAGNOSIS — M545 Low back pain, unspecified: Secondary | ICD-10-CM | POA: Insufficient documentation

## 2024-10-24 MED ORDER — PREGABALIN 25 MG PO CAPS: 25.0000 mg | ORAL_CAPSULE | Freq: Every evening | ORAL | 2 refills | Status: AC

## 2024-10-24 MED ORDER — TIZANIDINE HCL 4 MG PO TABS: 2.0000 mg | ORAL_TABLET | Freq: Every day | ORAL | 2 refills | Status: AC | PRN

## 2024-10-24 NOTE — Patient Instructions (Signed)
°  VISIT SUMMARY: Today, we discussed your chronic neuropathic pain in your feet and hands, which has been worsening over the past year. We also reviewed your type 2 diabetes management, rheumatoid arthritis, chronic low back pain, and shoulder pain. We made adjustments to your medications and recommended physical therapy to help with your mobility and pain management.  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 Gabapentin  was not effective for you. -Start taking pregabalin  25 mg at night for neuropathic pain. Stop if you have any side effects and let us  know. -You can try an over-the-counter Mentax supplement for nerve pain if you want. -Continue regular foot examinations. -Keep working on controlling your blood sugar to help with neuropathy and kidney function. -We will check your pregabalin  dose based on your kidney function.  RHEUMATOID ARTHRITIS, SEROPOSITIVE: You have rheumatoid arthritis managed by a specialist. Chronic prednisone  is used for pain and lung issues. -Continue taking acetaminophen  as needed for pain, but do not exceed the recommended dose.  CHRONIC LOW BACK PAIN: You have chronic muscular low back pain that gets worse with activity. Tizanidine  helps but causes sedation. -Reduce tizanidine  to half a tablet daily as needed to minimize sedation and confusion. -You are referred to physical therapy to help manage your pain and improve mobility.  CHRONIC BILATERAL SHOULDER PAIN: You have intermittent shoulder pain that improves with repositioning. Your shoulder movement is generally good but sometimes limited during pain episodes. -We ordered X-rays of both shoulders to check for any degenerative changes or arthritis. -We are not recommending steroid injections right now due to your blood sugar levels, but we may reconsider if your  blood sugar control improves.  ABNORMAL GAIT AND MOBILITY: You have had long-term issues with walking and need a walker. You want to stay active but have limited endurance. -You are referred to physical therapy to help with walking, mobility, and pain management. -Continue using your walker for safety. -We will see you in one month to check on your progress and how you are responding to the treatments.   Contains text generated by [employer].

## 2024-10-29 ENCOUNTER — Other Ambulatory Visit

## 2024-10-29 ENCOUNTER — Inpatient Hospital Stay: Admission: RE | Admit: 2024-10-29 | Discharge: 2024-10-29 | Attending: Internal Medicine | Admitting: Internal Medicine

## 2024-10-29 DIAGNOSIS — N63 Unspecified lump in unspecified breast: Secondary | ICD-10-CM

## 2024-10-30 ENCOUNTER — Ambulatory Visit (INDEPENDENT_AMBULATORY_CARE_PROVIDER_SITE_OTHER): Admitting: Adult Health

## 2024-10-30 ENCOUNTER — Ambulatory Visit: Payer: Self-pay | Admitting: Adult Health

## 2024-10-30 ENCOUNTER — Encounter: Payer: Self-pay | Admitting: Adult Health

## 2024-10-30 VITALS — BP 124/62 | HR 94 | Temp 97.5°F | Ht 62.75 in | Wt 218.6 lb

## 2024-10-30 DIAGNOSIS — Z87891 Personal history of nicotine dependence: Secondary | ICD-10-CM

## 2024-10-30 DIAGNOSIS — Z5181 Encounter for therapeutic drug level monitoring: Secondary | ICD-10-CM | POA: Diagnosis not present

## 2024-10-30 DIAGNOSIS — I509 Heart failure, unspecified: Secondary | ICD-10-CM

## 2024-10-30 DIAGNOSIS — M069 Rheumatoid arthritis, unspecified: Secondary | ICD-10-CM

## 2024-10-30 DIAGNOSIS — N1832 Chronic kidney disease, stage 3b: Secondary | ICD-10-CM

## 2024-10-30 DIAGNOSIS — J8489 Other specified interstitial pulmonary diseases: Secondary | ICD-10-CM

## 2024-10-30 DIAGNOSIS — I4891 Unspecified atrial fibrillation: Secondary | ICD-10-CM

## 2024-10-30 DIAGNOSIS — Z7952 Long term (current) use of systemic steroids: Secondary | ICD-10-CM

## 2024-10-30 DIAGNOSIS — I5032 Chronic diastolic (congestive) heart failure: Secondary | ICD-10-CM

## 2024-10-30 DIAGNOSIS — N183 Chronic kidney disease, stage 3 unspecified: Secondary | ICD-10-CM

## 2024-10-30 DIAGNOSIS — E876 Hypokalemia: Secondary | ICD-10-CM

## 2024-10-30 DIAGNOSIS — M059 Rheumatoid arthritis with rheumatoid factor, unspecified: Secondary | ICD-10-CM

## 2024-10-30 LAB — COMPREHENSIVE METABOLIC PANEL WITH GFR
ALT: 19 U/L (ref 3–35)
AST: 16 U/L (ref 5–37)
Albumin: 4.1 g/dL (ref 3.5–5.2)
Alkaline Phosphatase: 41 U/L (ref 39–117)
BUN: 33 mg/dL — ABNORMAL HIGH (ref 6–23)
CO2: 27 meq/L (ref 19–32)
Calcium: 9.2 mg/dL (ref 8.4–10.5)
Chloride: 101 meq/L (ref 96–112)
Creatinine, Ser: 1.81 mg/dL — ABNORMAL HIGH (ref 0.40–1.20)
GFR: 28.98 mL/min — ABNORMAL LOW
Glucose, Bld: 131 mg/dL — ABNORMAL HIGH (ref 70–99)
Potassium: 2.9 meq/L — ABNORMAL LOW (ref 3.5–5.1)
Sodium: 140 meq/L (ref 135–145)
Total Bilirubin: 0.5 mg/dL (ref 0.2–1.2)
Total Protein: 6.6 g/dL (ref 6.0–8.3)

## 2024-10-30 LAB — CBC WITH DIFFERENTIAL/PLATELET
Basophils Absolute: 0 K/uL (ref 0.0–0.1)
Basophils Relative: 0.5 % (ref 0.0–3.0)
Eosinophils Absolute: 0.1 K/uL (ref 0.0–0.7)
Eosinophils Relative: 2 % (ref 0.0–5.0)
HCT: 41.3 % (ref 36.0–46.0)
Hemoglobin: 14.1 g/dL (ref 12.0–15.0)
Lymphocytes Relative: 33.8 % (ref 12.0–46.0)
Lymphs Abs: 1.7 K/uL (ref 0.7–4.0)
MCHC: 34.1 g/dL (ref 30.0–36.0)
MCV: 90.8 fl (ref 78.0–100.0)
Monocytes Absolute: 0.5 K/uL (ref 0.1–1.0)
Monocytes Relative: 10.4 % (ref 3.0–12.0)
Neutro Abs: 2.7 K/uL (ref 1.4–7.7)
Neutrophils Relative %: 53.3 % (ref 43.0–77.0)
Platelets: 201 K/uL (ref 150.0–400.0)
RBC: 4.55 Mil/uL (ref 3.87–5.11)
RDW: 13.6 % (ref 11.5–15.5)
WBC: 5 K/uL (ref 4.0–10.5)

## 2024-10-30 NOTE — Patient Instructions (Addendum)
-  Continue on Actemra  weekly injection -Continue Prednisone  10 mg/day -Continue on  Bactrim  1 double strength tablet Monday Wednesday Friday to prevent opportunistic infections -Keep-follow up with Cardiology  -Follow up with Dr. Geronimo in 3 months and As needed  with PFT

## 2024-10-30 NOTE — Progress Notes (Signed)
 Called and spoke to pt - advised of lab results per Tammy Parrett. Pt verbalized understanding, NFN.  Pt is taking 2 potassium tablets daily.  Referral has been placed, lab worked ordered.

## 2024-10-30 NOTE — Progress Notes (Signed)
 "  @Patient  ID: Kathleen Kane, female    DOB: 07/28/1961, 63 y.o.   MRN: [id number]  No chief complaint on file.   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 Medical history significant for diabetes, chronic kidney disease, A-fib (Eliquis ), diastolic heart failure    TEST/EVENTS : Reviewed 10/30/2024  2D echo June 2023 EF 66 5%, right ventricle size is normal, moderate LVH   High-resolution CT chest May 19, 2023 interstitial lung disease changes compatible with an alternative diagnosis-not UIP favored to reflect chronic hypersensitivity pneumonitis  Upper lung zone predominant traction bronchiectasis, coarsened ground glass and scattered subpleural reticulation stable from July 2024 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  ILD/CTD Tx :  Actemra  January 2024 administered weekly along with chronic steroids with prednisone  5 to 10 mg daily, PJP with Bactrim  Discussed the use of AI scribe software for clinical note transcription with the patient, who gave verbal consent to proceed.  History of Present Illness Kathleen Kane is a 63 year old female with interstitial lung disease related to rheumatoid arthritis who presents for a two-month follow-up.  She reports feeling better since starting Actemra  injections weekly since January 2024 along with chronic steroids. Previously, she was unable to walk  but her mobility has since improved.Joint pain is more manageable.  She remains on prednisone  10 mg daily.  Has tried to go on lower doses but joint pain flares on prednisone   less than 10 mg daily.  She is on PJP with Bactrim  3 days a week. 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  Appeared stable since July 2024.SABRA  She reports being more active, using a walker, and occasionally walking without it. She experiences joint pain, , but notes that it improves with activity. She is mindful of her diet, particularly reducing sweets, and has not had a flu shot.  Declines flu shot.  Walk test today in the office shows no desaturations on room air.    Allergies[1]   There is no immunization history on file for this patient.  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 AHI-6.8/hr, during REM-27.3/hr. RDI-25.0/hr, during REM-40.9/hr. avg o2 sat during REM and NREM 95%   OSA (obstructive sleep apnea) 02/10/2017   Pelvic mass in female 01/29/2013   With ascites    Pneumonia 10/02/2018  Rheumatoid arthritis (HCC)    Rheumatoid arthritis, 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    Tobacco History: Tobacco Use History[2] Counseling given: Not Answered   Outpatient Medications Prior to Visit  Medication Sig Dispense Refill    apixaban  (ELIQUIS ) 5 MG TABS tablet TAKE 1 TABLET BY MOUTH TWICE A DAY 60 tablet 5   atorvastatin  (LIPITOR) 20 MG tablet TAKE 1 TABLET (20 MG TOTAL) BY MOUTH DAILY. PLEASE SCHEDULE YEARLY APPOINTMENT FOR FUTURE REFILLS. 1ST ATTEMPT. THANK YOU 30 tablet 2   B-D ULTRAFINE III SHORT PEN 31G X 8 MM MISC Inject into the skin 2 (two) times daily.     cetirizine  (ZYRTEC ) 10 MG tablet Take 1 tablet (10 mg total) by mouth at bedtime. 30 tablet 0   diazepam  (VALIUM ) 5 MG tablet Take 1 tablet (5 mg total) by mouth every 6 (six) hours as needed for muscle spasms. 15 tablet 0   ferrous sulfate  325 (65 FE) MG tablet Take 1 tablet (325 mg total) by mouth daily with breakfast. 30 tablet 3   folic acid  (FOLVITE ) 1 MG tablet Take 1 mg by mouth every morning.  3   furosemide  (LASIX ) 40 MG tablet Take 1 tablet (40 mg total) by mouth daily. 45 tablet 0   JARDIANCE 10 MG TABS tablet Take 10 mg by mouth daily.     metFORMIN  (GLUCOPHAGE ) 500 MG tablet Take 2 tablets (1,000 mg total) by mouth 2 (two) times daily. 60 tablet 0   metolazone  (ZAROXOLYN ) 5 MG tablet TAKE 1 TABLET EVERY OTHER DAY. TAKE 30 MINUTES PRIOR TO TAKING FUROSEMIDE  ( LASIX  ) 45 tablet 3   metoprolol  tartrate (LOPRESSOR ) 25 MG tablet TAKE 1 TABLET BY MOUTH TWICE A DAY 180 tablet 1   NOVOLOG  MIX 70/30 FLEXPEN (70-30) 100 UNIT/ML FlexPen Inject 0.4 mLs (40 Units total) into the skin 2 (two) times daily with a meal. 15 mL 0   potassium chloride  SA (KLOR-CON  M) 20 MEQ tablet Take 2 tablets (40 mEq total) by mouth daily. 60 tablet 0   predniSONE  (DELTASONE ) 10 MG tablet Take 1 tablet (10 mg total) by mouth daily with breakfast. 30 tablet 5   pregabalin  (LYRICA ) 25 MG capsule Take 1 capsule (25 mg total) by mouth at bedtime. 30 capsule 2   sulfamethoxazole -trimethoprim  (BACTRIM  DS) 800-160 MG tablet Take 1 tablet by mouth 3 (three) times a week. 12 tablet 11   tiZANidine  (ZANAFLEX ) 4 MG tablet Take 0.5 tablets (2 mg total) by mouth daily as needed for muscle  spasms. 30 tablet 2   Tocilizumab -aazg (TYENNE ) 162 MG/0.9ML SOAJ Inject 162 mg into the skin once a week. 3.6 mL 2   No facility-administered medications prior to visit.     Review of Systems:   Constitutional:   No  weight loss, night sweats,  Fevers, chills,+fatigue, or  lassitude.  HEENT:   No headaches,  Difficulty swallowing,  Tooth/dental problems, or  Sore throat,                No sneezing, itching, ear ache, nasal congestion, post nasal drip,   CV:  No chest pain,  Orthopnea, PND, swelling in lower extremities, anasarca, dizziness, palpitations, syncope.   GI  No heartburn, indigestion, abdominal pain, nausea, vomiting, diarrhea, change in bowel habits, loss of appetite, bloody stools.   Resp:   No chest wall deformity  Skin: no rash or lesions.  GU: no dysuria, change in color of urine, no urgency or frequency.  No flank pain, no hematuria  MS: Chronic joint pain   Physical Exam  LMP 10/28/2013   GEN: A/Ox3; pleasant , NAD, elderly, wheelchair   HEENT:  [place]/AT,   NOSE-clear, THROAT-clear, no lesions, no postnasal drip or exudate noted.   NECK:  Supple w/ fair ROM; no JVD; normal carotid impulses w/o bruits; no thyromegaly or nodules palpated; no lymphadenopathy.    RESP fair bibasilar crackles  no accessory muscle use, no dullness to percussion  CARD:  RRR, no m/r/g, minimum  peripheral edema, pulses intact, no cyanosis or clubbing.  GI:   Soft & nt; nml bowel sounds; no organomegaly or masses detected.   Musco: Warm bil, no deformities or joint swelling noted.   Neuro: alert, no focal deficits noted.    Skin: Warm, no lesions or rashes  Today's Vitals   10/30/24 0906 10/30/24 1004 10/30/24 1005  BP: 124/62    Pulse: 77 65 94  Temp: (!) 97.5 F (36.4 C)    SpO2: 97% 96% 95%  Weight: 218 lb 9.6 oz (99.2 kg)    Height: 5' 2.75 (1.594 m)     Body mass index is 39.03 kg/m.    Lab Results:Reviewed 10/30/2024   CBC    Component Value Date/Time    WBC 5.7 07/05/2024 0516   RBC 4.89 07/05/2024 0516   HGB 15.0 07/05/2024 0516   HGB 11.9 05/24/2022 1613   HCT 44.4 07/05/2024 0516   HCT 35.5 05/24/2022 1613   PLT 254 07/05/2024 0516   PLT 299 05/24/2022 1613   MCV 90.8 07/05/2024 0516   MCV 81 05/24/2022 1613   MCH 30.7 07/05/2024 0516   MCHC 33.8 07/05/2024 0516   RDW 13.8 07/05/2024 0516   RDW 15.3 05/24/2022 1613   LYMPHSABS 1.1 06/08/2024 1000   MONOABS 0.4 06/08/2024 1000   EOSABS 0.6 06/08/2024 1000   BASOSABS 0.1 06/08/2024 1000    BMET  Imaging:  Administration History     None          Latest Ref Rng & Units 05/28/2024    7:50 AM 05/23/2023    9:02 AM 02/25/2023    9:11 AM 11/16/2022   11:59 AM 10/19/2022    8:52 AM 04/13/2022   12:52 PM  PFT Results  FVC-Pre L 1.93  2.05  1.90  1.81  1.94  2.18   FVC-Predicted Pre % 63  67  61  60  65  92   FVC-Post L      2.35   FVC-Predicted Post %      99   Pre FEV1/FVC % % 89  93  93  90  92  80   Post FEV1/FCV % %      91   FEV1-Pre L 1.71  1.90  1.77  1.62  1.79  1.75   FEV1-Predicted Pre % 74  81  75  70  78  95   FEV1-Post L      2.15   DLCO uncorrected ml/min/mmHg 10.60  10.92  8.48  10.13  9.79  10.11   DLCO UNC% % 55  57  44  54  52  54   DLCO corrected ml/min/mmHg 10.60  10.75  8.48  10.70  10.33  11.21   DLCO COR %Predicted % 55  56  44  57  55  60   DLVA Predicted % 79  76  99  80  73  79   TLC L      3.20   TLC %  Predicted %      67   RV % Predicted %      -37     No results found for: NITRICOXIDE      No data to display              Assessment & Plan:   Assessment and Plan Assessment & Plan Connective tissue disease related ILD with rheumatoid arthritis -clinically stable.  Managed with Actemra  since January 2024 along with chronic steroids with prednisone  10 mg daily.  Have tried to reduce prednisone  further below 10 mg but patient has flare of joint pain on lower dose of.  Recent high-resolution CT chest earlier this month showed  stable fibrotic changes..  Shortness of breath and activity tolerance at baseline.  Walk test today in the office without desaturations.  Check spirometry with DLCO on follow-up visit.  Continue on PJP with Bactrim  3 days a week.  Blood work is ordered to monitor blood count, liver function, and kidney function. Walking is encouraged as tolerated, starting with five minutes and gradually increasing, using a walker with a bench seat for support. Good hand hygiene is advised to prevent infections.   Therapeutic drug monitoring   Ongoing monitoring is necessary with Actemra  .  CBC/CMET pending   Congestive heart failure-appears euvolemic on exam.  Patient was hospitalized in August for decompensated congestive heart failure requiring aggressive diuresis.  Patient appears to be doing well.  Continue follow-up with cardiology  A-fib-appears stable.  Continue follow-up with cardiology and current maintenance regimen  Chronic kidney disease stage III.  Continue follow-up with primary care for ongoing management.  Rheumatoid arthritis-currently stable Joint pain improved control on Actemra .  No longer following up with rheumatology.  We discussed going forward would like her to reestablish with rheumatology.  Will hold for now as patient is hesitant as she has not had a good experience in the past.  Plan  Patient Instructions  -Continue on Actemra  weekly injection -Continue Prednisone  10 mg/day -Continue on  Bactrim  1 double strength tablet Monday Wednesday Friday to prevent opportunistic infections -Keep-follow up with Cardiology  -Follow up with Dr. Geronimo in 3 months and As needed  with PFT           Madelin Stank, NP 10/30/2024  I spent 42   minutes dedicated to the care of this patient on the date of this encounter to include pre-visit review of records, face-to-face time with the patient discussing conditions above, post visit ordering of testing, clinical documentation with the  electronic health record, making appropriate referrals as documented, and communicating necessary findings to members of the patients care team.      [1]  Allergies Allergen Reactions   Ace Inhibitors Cough   Lisinopril  Cough  [2]  Social History Tobacco Use  Smoking Status Former   Current packs/day: 0.00   Average packs/day: 1 pack/day for 37.6 years (37.6 ttl pk-yrs)   Types: Cigarettes   Start date: 22   Quit date: 06/08/2014   Years since quitting: 10.4   Passive exposure: Never  Smokeless Tobacco Never   "

## 2024-11-06 ENCOUNTER — Other Ambulatory Visit

## 2024-11-06 ENCOUNTER — Other Ambulatory Visit: Payer: Self-pay

## 2024-11-09 ENCOUNTER — Other Ambulatory Visit

## 2024-11-09 ENCOUNTER — Other Ambulatory Visit: Payer: Self-pay

## 2024-11-09 DIAGNOSIS — E876 Hypokalemia: Secondary | ICD-10-CM | POA: Diagnosis not present

## 2024-11-09 LAB — BASIC METABOLIC PANEL WITH GFR
BUN: 62 mg/dL — ABNORMAL HIGH (ref 6–23)
CO2: 30 meq/L (ref 19–32)
Calcium: 10.7 mg/dL — ABNORMAL HIGH (ref 8.4–10.5)
Chloride: 89 meq/L — ABNORMAL LOW (ref 96–112)
Creatinine, Ser: 2.02 mg/dL — ABNORMAL HIGH (ref 0.40–1.20)
GFR: 25.4 mL/min — ABNORMAL LOW
Glucose, Bld: 222 mg/dL — ABNORMAL HIGH (ref 70–99)
Potassium: 2.9 meq/L — ABNORMAL LOW (ref 3.5–5.1)
Sodium: 136 meq/L (ref 135–145)

## 2024-11-09 NOTE — Progress Notes (Signed)
 Specialty Pharmacy Refill Coordination Note  Kathleen Kane is a 64 y.o. female contacted today regarding refills of specialty medication(s) Tocilizumab -aazg (Tyenne )   Patient requested [name] at [facility] at [place] date: 11/13/24   Medication will be filled on: 11/12/24

## 2024-11-12 ENCOUNTER — Other Ambulatory Visit: Payer: Self-pay

## 2024-11-15 ENCOUNTER — Ambulatory Visit: Payer: Self-pay | Admitting: Adult Health

## 2024-11-22 ENCOUNTER — Other Ambulatory Visit: Payer: Self-pay | Admitting: Internal Medicine

## 2024-11-28 ENCOUNTER — Encounter: Admitting: Physical Medicine and Rehabilitation

## 2024-12-04 ENCOUNTER — Other Ambulatory Visit: Payer: Self-pay

## 2024-12-04 ENCOUNTER — Other Ambulatory Visit: Payer: Self-pay | Admitting: Internal Medicine

## 2024-12-04 DIAGNOSIS — M059 Rheumatoid arthritis with rheumatoid factor, unspecified: Secondary | ICD-10-CM

## 2024-12-04 DIAGNOSIS — M359 Systemic involvement of connective tissue, unspecified: Secondary | ICD-10-CM

## 2024-12-05 ENCOUNTER — Other Ambulatory Visit: Payer: Self-pay

## 2024-12-06 ENCOUNTER — Other Ambulatory Visit: Payer: Self-pay

## 2024-12-07 ENCOUNTER — Ambulatory Visit: Attending: Internal Medicine

## 2024-12-07 ENCOUNTER — Other Ambulatory Visit: Payer: Self-pay

## 2024-12-07 DIAGNOSIS — M059 Rheumatoid arthritis with rheumatoid factor, unspecified: Secondary | ICD-10-CM

## 2024-12-07 DIAGNOSIS — J8489 Other specified interstitial pulmonary diseases: Secondary | ICD-10-CM

## 2024-12-07 MED ORDER — TYENNE 162 MG/0.9ML ~~LOC~~ SOAJ
162.0000 mg | SUBCUTANEOUS | 2 refills | Status: AC
  Filled 2024-12-07: qty 3.6, 28d supply, fill #0

## 2024-12-07 NOTE — Progress Notes (Addendum)
 Specialty Pharmacy Refill Coordination Note  Kathleen Kane is a 63 y.o. female contacted today regarding refills of specialty medication(s) Tocilizumab -aazg (Tyenne )   Patient requested [name] at [facility] at [place] date: 12/10/24   Medication will be filled on: 12/07/24  Patient's new insurance will be active 2/1 and call center staff will update before filling 2/2.

## 2024-12-07 NOTE — Progress Notes (Signed)
 [facility]  Referring Provider: Dr. Geronimo  Virtual Visit via Telephone Note  I connected with Kathleen Kane on 12/07/24 at 10:30 AM EST by telephone and verified that I am speaking with the correct person using two identifiers.  Location: Patient: home Provider: office   I discussed the limitations, risks, security and privacy concerns of performing an evaluation and management service by telephone and the availability of in person appointments. I also discussed with the patient that there may be a patient responsible charge related to this service. The patient expressed understanding and agreed to proceed.   HPI: Kathleen Kane is a 64 y.o. female who presents to the pharmacotherapy clinic via telephone for Tyenne  follow-up counseling.  Patient Active Problem List   Diagnosis Date Noted   Diabetic polyneuropathy associated with type 2 diabetes mellitus (HCC) 10/24/2024   Chronic midline low back pain without sciatica 10/24/2024   Chronic pain of both shoulders 10/24/2024   Abnormality of gait and mobility 10/24/2024   Acute diastolic (congestive) heart failure (HCC) 07/04/2024   Hyponatremia 01/05/2024   ILD (interstitial lung disease) (HCC) 04/21/2022   Nocturnal hypoxemia 04/21/2022   Acute pulmonary edema (HCC) 03/12/2022   Acute respiratory failure with hypoxia (HCC) 03/11/2022   Colon cancer screening 01/26/2022   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   Dysphagia 01/26/2022   History of colonic polyps 01/26/2022   Aortic atherosclerosis 05/13/2021   Acute otalgia, right 11/26/2020   Arthralgia of right temporomandibular joint 11/26/2020   Bilateral impacted cerumen 11/26/2020   HCAP (healthcare-associated pneumonia) 10/02/2018   Chronic congestive heart failure (HCC) 10/01/2018   OSA (obstructive sleep apnea) 02/10/2017   Carpal tunnel syndrome, right 12/15/2016   Extensor tenosynovitis of wrist, right 12/15/2016   Bilateral primary  osteoarthritis of knee 10/08/2015   Rheumatoid arthritis, 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   Acute on chronic diastolic CHF (congestive heart failure) (HCC) 08/08/2015   Morbid obesity (HCC) 05/06/2015   Essential hypertension 04/10/2015   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   Primary osteoarthritis involving multiple joints 04/09/2015   High risk medications (not anticoagulants) long-term use 02/04/2015   Elevated uric acid in blood 01/23/2014   High risk medication use 01/23/2014   Numbness and tingling in left hand 10/01/2013   Tobacco abuse 10/01/2013   Elevated CA-125 02/06/2013   Pelvic mass in female 01/29/2013   Diabetes mellitus (HCC) 08/10/2012   Atrial fibrillation (HCC)    Chronic diastolic CHF (congestive heart failure) (HCC)     Patient's Medications  New Prescriptions   No medications on file  Previous Medications   APIXABAN  (ELIQUIS ) 5 MG TABS TABLET    TAKE 1 TABLET BY MOUTH TWICE A DAY   ATORVASTATIN  (LIPITOR) 20 MG TABLET    TAKE 1 TABLET (20 MG TOTAL) BY MOUTH DAILY. PLEASE SCHEDULE YEARLY APPOINTMENT FOR FUTURE REFILLS. 1ST ATTEMPT. THANK YOU   B-D ULTRAFINE III SHORT PEN 31G X 8 MM MISC    Inject into the skin 2 (two) times daily.   CETIRIZINE  (ZYRTEC ) 10 MG TABLET    Take 1 tablet (10 mg total) by mouth at bedtime.   DIAZEPAM  (VALIUM ) 5 MG TABLET    Take 1 tablet (5 mg total) by mouth every 6 (six) hours as needed for muscle spasms.   FERROUS SULFATE  325 (65 FE) MG TABLET    Take 1 tablet (325 mg total) by mouth daily with breakfast.   FOLIC ACID  (FOLVITE ) 1 MG TABLET  Take 1 mg by mouth every morning.   FUROSEMIDE  (LASIX ) 40 MG TABLET    Take 1 tablet (40 mg total) by mouth daily.   JARDIANCE 10 MG TABS TABLET    Take 10 mg by mouth daily.   METFORMIN  (GLUCOPHAGE ) 500 MG TABLET    Take 2 tablets (1,000 mg total) by mouth 2 (two) times daily.   METOLAZONE  (ZAROXOLYN ) 5 MG TABLET    TAKE 1 TABLET EVERY OTHER DAY. TAKE 30 MINUTES PRIOR  TO TAKING FUROSEMIDE  ( LASIX  )   METOPROLOL  TARTRATE (LOPRESSOR ) 25 MG TABLET    TAKE 1 TABLET BY MOUTH TWICE A DAY   NOVOLOG  MIX 70/30 FLEXPEN (70-30) 100 UNIT/ML FLEXPEN    Inject 0.4 mLs (40 Units total) into the skin 2 (two) times daily with a meal.   POTASSIUM CHLORIDE  SA (KLOR-CON  M) 20 MEQ TABLET    Take 2 tablets (40 mEq total) by mouth daily.   PREDNISONE  (DELTASONE ) 10 MG TABLET    Take 1 tablet (10 mg total) by mouth daily with breakfast.   PREDNISONE  (DELTASONE ) 5 MG TABLET    Take 2 tablets (10 mg total) by mouth daily with breakfast.   PREGABALIN  (LYRICA ) 25 MG CAPSULE    Take 1 capsule (25 mg total) by mouth at bedtime.   SULFAMETHOXAZOLE -TRIMETHOPRIM  (BACTRIM  DS) 800-160 MG TABLET    Take 1 tablet by mouth 3 (three) times a week.   TIZANIDINE  (ZANAFLEX ) 4 MG TABLET    Take 0.5 tablets (2 mg total) by mouth daily as needed for muscle spasms.   TOCILIZUMAB -AAZG (TYENNE ) 162 MG/0.9ML SOAJ    Inject 162 mg into the skin once a week.  Modified Medications   No medications on file  Discontinued Medications   No medications on file    Allergies: Allergies[1]  Past Medical History: Past Medical History:  Diagnosis Date   Acute on chronic diastolic CHF (congestive heart failure) (HCC) 08/08/2015   Anxiety    Atrial fibrillation, persistent (HCC)    Carpal tunnel syndrome, right 12/15/2016   CHF (congestive heart failure) (HCC) 2013   No echo found from that time, most likely diastolic   CHF exacerbation (HCC) 10/01/2018   Depression    Diabetes mellitus (HCC) 08/10/2012   Diabetes mellitus without complication (HCC)    Hypertension    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 AHI-6.8/hr, during REM-27.3/hr. RDI-25.0/hr, during REM-40.9/hr. avg o2 sat during REM and NREM 95%   OSA (obstructive sleep  apnea) 02/10/2017   Pelvic mass in female 01/29/2013   With ascites    Pneumonia 10/02/2018   Rheumatoid arthritis (HCC)    Rheumatoid arthritis, 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    Social History: Social History   Socioeconomic History   Marital status: Married    Spouse name: Turley,Theodore   Number of children: 2   Years of education: Not on file   Highest education level: Not on file  Occupational History   Not on file  Tobacco Use   Smoking status: Former    Current packs/day: 0.00    Average packs/day: 1 pack/day for 37.6 years (37.6 ttl pk-yrs)    Types: Cigarettes    Start date: 39    Quit date: 06/08/2014    Years since quitting: 10.5    Passive exposure: Never   Smokeless  tobacco: Never  Vaping Use   Vaping status: Former  Substance and Sexual Activity   Alcohol use: No    Alcohol/week: 0.0 standard drinks of alcohol   Drug use: No   Sexual activity: Yes    Partners: Male  Other Topics Concern   Not on file  Social History Narrative   Not on file   Social Drivers of Health   Tobacco Use: Medium Risk (10/30/2024)   Patient History    Smoking Tobacco Use: Former    Smokeless Tobacco Use: Never    Passive Exposure: Never  Physicist, Medical Strain: Not on file  Food Insecurity: No Food Insecurity (07/04/2024)   Epic    Worried About Programme Researcher, Broadcasting/film/video in the Last Year: Never true    Ran Out of Food in the Last Year: Never true  Transportation Needs: No Transportation Needs (07/04/2024)   Epic    Lack of Transportation (Medical): No    Lack of Transportation (Non-Medical): No  Physical Activity: Not on file  Stress: Not on file  Social Connections: Moderately Isolated (07/04/2024)   Social Connection and Isolation Panel    Frequency of Communication with Friends and Family: Twice a week    Frequency of Social Gatherings with Friends and Family: Three times a week    Attends Religious Services: Never    Active Member of Clubs or Organizations: No     Attends Banker Meetings: Never    Marital Status: Married  Depression (PHQ2-9): Low Risk (10/24/2024)   Depression (PHQ2-9)    PHQ-2 Score: 0  Alcohol Screen: Not on file  Housing: Low Risk (07/04/2024)   Epic    Unable to Pay for Housing in the Last Year: No    Number of Times Moved in the Last Year: 0    Homeless in the Last Year: No  Utilities: Not At Risk (07/04/2024)   Epic    Threatened with loss of utilities: No  Health Literacy: Not on file    Medication: Tocilizumab -aazg (TYENNE ) 162 MG/0.9ML SOAJ  Assessment: Patient continues Tyenne  for CTD-related ILD with RA. She denies side effects or concerns. Takes in addition to chronic steroids (prednisone  10mg  daily) + Bactrim  DS Mon/Wed/Fri to prevent opportunistic infections.   She is due for refill. Endorses insurance changes. Rx triaged to [facility] to fill 12/10/24. Will need updated insurance to fill; patient is aware copay could change.   Plan: - CONTINUE Tyenne  162mg  [place] every week - Rx triaged to [facility].  I discussed the assessment and treatment plan with the patient. The patient was provided an opportunity to ask questions and all were answered. The patient agreed with the plan and demonstrated an understanding of the instructions.   The patient was advised to call back or seek an in-person evaluation if the symptoms worsen or if the condition fails to improve as anticipated.  I provided 10 minutes of non-face-to-face time during this encounter.  Aleck Puls, PharmD, BCPS, CPP Clinical Pharmacist  [facility]  [facility]    [1]  Allergies Allergen Reactions   Ace Inhibitors Cough   Lisinopril  Cough

## 2024-12-10 ENCOUNTER — Other Ambulatory Visit: Payer: Self-pay

## 2024-12-11 ENCOUNTER — Other Ambulatory Visit: Payer: Self-pay

## 2024-12-24 ENCOUNTER — Encounter: Attending: Physical Medicine and Rehabilitation | Admitting: Physical Medicine and Rehabilitation

## 2024-12-27 ENCOUNTER — Ambulatory Visit: Admitting: Podiatry

## 2025-01-11 ENCOUNTER — Ambulatory Visit: Admitting: Physical Therapy

## 2025-02-22 ENCOUNTER — Encounter

## 2025-02-22 ENCOUNTER — Ambulatory Visit: Admitting: Internal Medicine
